# Patient Record
Sex: Female | Born: 1935 | Race: White | Hispanic: No | Marital: Married | Smoking: Former smoker
Health system: Southern US, Community
[De-identification: ages and names within clinical notes are randomized; demographics above are authoritative.]

## PROBLEM LIST (undated history)

## (undated) DIAGNOSIS — J329 Chronic sinusitis, unspecified: Secondary | ICD-10-CM

## (undated) DIAGNOSIS — E785 Hyperlipidemia, unspecified: Secondary | ICD-10-CM

## (undated) DIAGNOSIS — E039 Hypothyroidism, unspecified: Secondary | ICD-10-CM

## (undated) DIAGNOSIS — Z5189 Encounter for other specified aftercare: Secondary | ICD-10-CM

## (undated) DIAGNOSIS — J449 Chronic obstructive pulmonary disease, unspecified: Secondary | ICD-10-CM

## (undated) DIAGNOSIS — C4490 Unspecified malignant neoplasm of skin, unspecified: Secondary | ICD-10-CM

## (undated) DIAGNOSIS — G9332 Myalgic encephalomyelitis/chronic fatigue syndrome: Secondary | ICD-10-CM

## (undated) DIAGNOSIS — D369 Benign neoplasm, unspecified site: Secondary | ICD-10-CM

## (undated) DIAGNOSIS — M349 Systemic sclerosis, unspecified: Secondary | ICD-10-CM

## (undated) DIAGNOSIS — R5382 Chronic fatigue, unspecified: Secondary | ICD-10-CM

## (undated) DIAGNOSIS — K219 Gastro-esophageal reflux disease without esophagitis: Secondary | ICD-10-CM

## (undated) DIAGNOSIS — B029 Zoster without complications: Secondary | ICD-10-CM

## (undated) DIAGNOSIS — E041 Nontoxic single thyroid nodule: Secondary | ICD-10-CM

## (undated) DIAGNOSIS — G6181 Chronic inflammatory demyelinating polyneuritis: Secondary | ICD-10-CM

## (undated) DIAGNOSIS — R569 Unspecified convulsions: Secondary | ICD-10-CM

## (undated) DIAGNOSIS — N63 Unspecified lump in unspecified breast: Secondary | ICD-10-CM

## (undated) DIAGNOSIS — I2699 Other pulmonary embolism without acute cor pulmonale: Secondary | ICD-10-CM

## (undated) DIAGNOSIS — N281 Cyst of kidney, acquired: Secondary | ICD-10-CM

## (undated) DIAGNOSIS — I7300 Raynaud's syndrome without gangrene: Secondary | ICD-10-CM

## (undated) DIAGNOSIS — Z87442 Personal history of urinary calculi: Secondary | ICD-10-CM

## (undated) DIAGNOSIS — IMO0002 Reserved for concepts with insufficient information to code with codable children: Secondary | ICD-10-CM

## (undated) DIAGNOSIS — D649 Anemia, unspecified: Secondary | ICD-10-CM

## (undated) DIAGNOSIS — K635 Polyp of colon: Secondary | ICD-10-CM

## (undated) DIAGNOSIS — M1990 Unspecified osteoarthritis, unspecified site: Secondary | ICD-10-CM

## (undated) DIAGNOSIS — N319 Neuromuscular dysfunction of bladder, unspecified: Secondary | ICD-10-CM

## (undated) DIAGNOSIS — I10 Essential (primary) hypertension: Secondary | ICD-10-CM

## (undated) HISTORY — DX: Chronic obstructive pulmonary disease, unspecified: J44.9

## (undated) HISTORY — PX: OTHER SURGICAL HISTORY: SHX169

## (undated) HISTORY — DX: Nontoxic single thyroid nodule: E04.1

## (undated) HISTORY — PX: BREAST EXCISIONAL BIOPSY: SUR124

## (undated) HISTORY — DX: Anemia, unspecified: D64.9

## (undated) HISTORY — DX: Zoster without complications: B02.9

## (undated) HISTORY — PX: HEMICOLECTOMY: SHX854

## (undated) HISTORY — DX: Benign neoplasm, unspecified site: D36.9

## (undated) HISTORY — PX: NASAL POLYP SURGERY: SHX186

## (undated) HISTORY — PX: SHOULDER ARTHROSCOPY: SHX128

## (undated) HISTORY — PX: CARPAL TUNNEL RELEASE: SHX101

## (undated) HISTORY — PX: BLADDER SUSPENSION: SHX72

## (undated) HISTORY — DX: Unspecified osteoarthritis, unspecified site: M19.90

## (undated) HISTORY — DX: Polyp of colon: K63.5

## (undated) HISTORY — DX: Chronic fatigue, unspecified: R53.82

## (undated) HISTORY — DX: Hyperlipidemia, unspecified: E78.5

## (undated) HISTORY — DX: Hypothyroidism, unspecified: E03.9

## (undated) HISTORY — PX: ABDOMINAL HYSTERECTOMY: SHX81

## (undated) HISTORY — PX: DILATION AND CURETTAGE OF UTERUS: SHX78

## (undated) HISTORY — DX: Chronic sinusitis, unspecified: J32.9

## (undated) HISTORY — DX: Encounter for other specified aftercare: Z51.89

## (undated) HISTORY — DX: Unspecified lump in unspecified breast: N63.0

## (undated) HISTORY — DX: Myalgic encephalomyelitis/chronic fatigue syndrome: G93.32

## (undated) HISTORY — PX: JOINT REPLACEMENT: SHX530

## (undated) HISTORY — PX: BASAL CELL CARCINOMA EXCISION: SHX1214

## (undated) HISTORY — PX: NASAL SEPTUM SURGERY: SHX37

## (undated) HISTORY — DX: Cyst of kidney, acquired: N28.1

## (undated) HISTORY — PX: CATARACT EXTRACTION: SUR2

## (undated) HISTORY — DX: Unspecified convulsions: R56.9

## (undated) HISTORY — PX: APPENDECTOMY: SHX54

## (undated) HISTORY — DX: Unspecified malignant neoplasm of skin, unspecified: C44.90

## (undated) HISTORY — DX: Gastro-esophageal reflux disease without esophagitis: K21.9

## (undated) HISTORY — PX: FINGER GANGLION CYST EXCISION: SHX1636

## (undated) HISTORY — DX: Essential (primary) hypertension: I10

## (undated) HISTORY — DX: Other pulmonary embolism without acute cor pulmonale: I26.99

## (undated) HISTORY — DX: Raynaud's syndrome without gangrene: I73.00

## (undated) HISTORY — PX: CYSTOCELE REPAIR: SHX163

## (undated) HISTORY — DX: Reserved for concepts with insufficient information to code with codable children: IMO0002

## (undated) HISTORY — PX: PANNICULECTOMY: SUR1001

## (undated) HISTORY — PX: TUBAL LIGATION: SHX77

## (undated) HISTORY — PX: TONSILLECTOMY AND ADENOIDECTOMY: SUR1326

## (undated) HISTORY — DX: Systemic sclerosis, unspecified: M34.9

## (undated) SURGERY — Surgical Case
Anesthesia: *Unknown

---
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
## 1974-05-29 HISTORY — PX: ARTHROSCOPIC REPAIR ACL: SUR80

## 2003-06-26 ENCOUNTER — Ambulatory Visit: Admission: RE | Admit: 2003-06-26 | Discharge: 2003-06-26 | Payer: Self-pay | Admitting: Internal Medicine

## 2004-03-08 ENCOUNTER — Encounter (INDEPENDENT_AMBULATORY_CARE_PROVIDER_SITE_OTHER): Payer: Self-pay | Admitting: *Deleted

## 2004-03-08 ENCOUNTER — Ambulatory Visit (HOSPITAL_BASED_OUTPATIENT_CLINIC_OR_DEPARTMENT_OTHER): Admission: RE | Admit: 2004-03-08 | Discharge: 2004-03-08 | Payer: Self-pay | Admitting: Orthopedic Surgery

## 2004-03-08 ENCOUNTER — Ambulatory Visit (HOSPITAL_COMMUNITY): Admission: RE | Admit: 2004-03-08 | Discharge: 2004-03-08 | Payer: Self-pay | Admitting: Orthopedic Surgery

## 2009-02-11 ENCOUNTER — Encounter: Admission: RE | Admit: 2009-02-11 | Discharge: 2009-02-11 | Payer: Self-pay | Admitting: Orthopedic Surgery

## 2009-02-16 ENCOUNTER — Ambulatory Visit (HOSPITAL_BASED_OUTPATIENT_CLINIC_OR_DEPARTMENT_OTHER): Admission: RE | Admit: 2009-02-16 | Discharge: 2009-02-16 | Payer: Self-pay | Admitting: Orthopedic Surgery

## 2009-05-31 ENCOUNTER — Encounter: Admission: RE | Admit: 2009-05-31 | Discharge: 2009-05-31 | Payer: Self-pay | Admitting: Rheumatology

## 2009-08-17 ENCOUNTER — Ambulatory Visit (HOSPITAL_BASED_OUTPATIENT_CLINIC_OR_DEPARTMENT_OTHER): Admission: RE | Admit: 2009-08-17 | Discharge: 2009-08-17 | Payer: Self-pay | Admitting: Orthopedic Surgery

## 2010-04-11 LAB — PULMONARY FUNCTION TEST

## 2010-08-21 LAB — POCT HEMOGLOBIN-HEMACUE: Hemoglobin: 12.3 g/dL (ref 12.0–15.0)

## 2010-08-21 LAB — BASIC METABOLIC PANEL
BUN: 26 mg/dL — ABNORMAL HIGH (ref 6–23)
CO2: 27 mEq/L (ref 19–32)
Calcium: 9.1 mg/dL (ref 8.4–10.5)
Chloride: 108 mEq/L (ref 96–112)
Creatinine, Ser: 0.7 mg/dL (ref 0.4–1.2)
GFR calc Af Amer: >60 mL/min (ref >60)
GFR calc non Af Amer: >60 mL/min (ref >60)
Glucose, Bld: 85 mg/dL (ref 70–99)
Potassium: 4.3 mEq/L (ref 3.5–5.1)
Sodium: 141 mEq/L (ref 135–145)

## 2010-09-02 LAB — BASIC METABOLIC PANEL
BUN: 27 mg/dL — ABNORMAL HIGH (ref 6–23)
CO2: 28 mEq/L (ref 19–32)
Calcium: 9.3 mg/dL (ref 8.4–10.5)
Chloride: 102 mEq/L (ref 96–112)
Creatinine, Ser: 0.71 mg/dL (ref 0.4–1.2)
GFR calc Af Amer: >60 mL/min (ref >60)
GFR calc non Af Amer: >60 mL/min (ref >60)
Glucose, Bld: 93 mg/dL (ref 70–99)
Potassium: 4.2 mEq/L (ref 3.5–5.1)
Sodium: 138 mEq/L (ref 135–145)

## 2010-09-02 LAB — POCT HEMOGLOBIN-HEMACUE: Hemoglobin: 14.3 g/dL (ref 12.0–15.0)

## 2010-10-14 NOTE — Op Note (Signed)
NAME:  KARYSSA, AMARAL              ACCOUNT NO.:  [id number]   MEDICAL RECORD NO.:  [id number]          PATIENT TYPE:  AMB   LOCATION:  DSC                          FACILITY:  [facility]   PHYSICIAN:  Katy Fitch. 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   POSTOPERATIVE DIAGNOSES:  Inflammatory destructive arthritis of the left  long finger, proximal interphalangeal joint.   OPERATION:  Arthrodesis of left long finger proximal interphalangeal joint,  utilizing an ASIF mini-fragment plate and lag screw technique, with  supplemental Kirschner wire fixation to control rotation.   SURGEON:  Katy Fitch. Sypher, M.D.   ASSISTANT:  Marveen Reeks. Dasnoit, P.A.-C.   ANESTHESIA:  Infraclavicular block, supplemented by IV sedation.   ANESTHESIOLOGIST:  Zenon Mayo, M.D.   INDICATIONS FOR PROCEDURE:  The patient is a 77 year old woman referred by  Dr. Casimiro Needle B. Wert for the evaluation and management of a severely-painful  and deformed left long finger PIP joint.  She has a history of inflammatory  arthritis with multiple inflamed and deformed joints.  She has had multiple  prior reconstructive hand surgery procedures in various places throughout  the [place], including [facility] reconstruction and other arthritis  procedures.  After an informed consent, she is brought to the operating room  at this time with the primary indication for surgery being relief of pain,  and secondary indication the creation of a stable long finger PIP joint that  would be useful for firm prehension.  Prior to surgery, questions are  invited and answered.   DESCRIPTION OF PROCEDURE:  The patient is brought to the operating room and  placed in the supine position on the operating room table.   Following  infraclavicular block in the holding area, anesthesia was satisfactory in  the left arm.  The arm was then prepped with Betadine soap and solution and  sterilely draped.  The pneumatic tourniquet was applied to the proximal  brachium.  One g of Ancef was administered as an IV prophylactic antibiotic.  Following exsanguination of the left arm with the Esmarch bandage and  placing the arterial tourniquet to 260 mmHg due to mild systolic  hypertension.  The procedure commenced with a curvilinear incision exposing  the extensor mechanism over the PIP joint.  The extensor was severely  swollen, due to chronic arthritis.  The extensor was split in the midline  and elevated by release of the central slip.  The collateral ligaments were  released radial and ulnar, followed by opening the joint shotgun style.  The condyles of the middle and proximal phalanx were shaped in a cup and  cone manner, utilizing a power bur, and rongeur dissection.  Provisional  fixation was obtained with a 0.035 Kirschner wire, which positioned the  finger joint in 35 degrees of flexion and proper rotation to allow a  slightly supinated posture, to facilitate pulp to pulp pinch with the  thumb.  An ASIF mini-fragment plate was shaped to accommodate this joint position,  followed by placement with a total of five screws with the standard  technique and a lag screw compressing the joint.  AP and lateral C-arm  images documented satisfactory placement of the plate, Kirschner wire and  excellent joint opposition.  The wound was then thoroughly lavaged with  sterile saline, followed by a repair of the extensor with mattress sutures  of #4-0 Mersilene and repair of the skin with intradermal #4-0 Prolene.  There were no apparent complications with respect to the procedure.  A  needle stick-type injury occurred with Jonni Sanger, P.A.-C., my  assistant.  Appropriate serologies will be obtained.  The patient was  awakened from her sedation and transferred to the recovery  room in stable condition with stable vital signs.   DISPOSITION:  She will be discharged to the care of her family with a  prescription for Percocet 5 mg, one or two tab p.o. q.4-6h. p.r.n. pain, #30  tablets without refill.  Also Augmentin 875 mg, one p.o. b.i.d. x5 days.  She has the Augmentin at home at this time.       RVS/MEDQ  D:  03/08/2004  T:  03/08/2004  Job:  [id number]   cc:   Charlaine Dalton. Sherene Sires, M.D. [facility]

## 2010-11-07 ENCOUNTER — Encounter (HOSPITAL_BASED_OUTPATIENT_CLINIC_OR_DEPARTMENT_OTHER)
Admission: RE | Admit: 2010-11-07 | Discharge: 2010-11-07 | Disposition: A | Discharge status: Home / Self Care | Payer: Medicare Other | Source: Ambulatory Visit | Attending: Orthopedic Surgery | Admitting: Orthopedic Surgery

## 2010-11-07 ENCOUNTER — Ambulatory Visit
Admission: RE | Admit: 2010-11-07 | Discharge: 2010-11-07 | Disposition: A | Discharge status: Home / Self Care | Payer: Medicare Other | Source: Ambulatory Visit | Attending: Orthopedic Surgery | Admitting: Orthopedic Surgery

## 2010-11-07 ENCOUNTER — Other Ambulatory Visit: Payer: Self-pay | Admitting: Orthopedic Surgery

## 2010-11-07 DIAGNOSIS — Z01811 Encounter for preprocedural respiratory examination: Secondary | ICD-10-CM

## 2010-11-07 LAB — BASIC METABOLIC PANEL
BUN: 20 mg/dL (ref 6–23)
CO2: 27 mEq/L (ref 19–32)
Calcium: 9.1 mg/dL (ref 8.4–10.5)
Chloride: 106 mEq/L (ref 96–112)
Creatinine, Ser: 0.65 mg/dL (ref 0.4–1.2)
GFR calc Af Amer: >60 mL/min (ref >60)
GFR calc non Af Amer: >60 mL/min (ref >60)
Glucose, Bld: 78 mg/dL (ref 70–99)
Potassium: 4.2 mEq/L (ref 3.5–5.1)
Sodium: 140 mEq/L (ref 135–145)

## 2010-11-08 ENCOUNTER — Other Ambulatory Visit: Payer: Self-pay | Admitting: Orthopedic Surgery

## 2010-11-08 ENCOUNTER — Ambulatory Visit (HOSPITAL_BASED_OUTPATIENT_CLINIC_OR_DEPARTMENT_OTHER)
Admission: RE | Admit: 2010-11-08 | Discharge: 2010-11-08 | Disposition: A | Discharge status: Home / Self Care | Payer: Medicare Other | Source: Ambulatory Visit | Attending: Orthopedic Surgery | Admitting: Orthopedic Surgery

## 2010-11-08 DIAGNOSIS — J4489 Other specified chronic obstructive pulmonary disease: Secondary | ICD-10-CM | POA: Insufficient documentation

## 2010-11-08 DIAGNOSIS — Z01812 Encounter for preprocedural laboratory examination: Secondary | ICD-10-CM | POA: Insufficient documentation

## 2010-11-08 DIAGNOSIS — M1990 Unspecified osteoarthritis, unspecified site: Secondary | ICD-10-CM | POA: Insufficient documentation

## 2010-11-08 DIAGNOSIS — M720 Palmar fascial fibromatosis [Dupuytren]: Secondary | ICD-10-CM | POA: Insufficient documentation

## 2010-11-08 DIAGNOSIS — I10 Essential (primary) hypertension: Secondary | ICD-10-CM | POA: Insufficient documentation

## 2010-11-08 DIAGNOSIS — J449 Chronic obstructive pulmonary disease, unspecified: Secondary | ICD-10-CM | POA: Insufficient documentation

## 2010-11-08 DIAGNOSIS — Z01810 Encounter for preprocedural cardiovascular examination: Secondary | ICD-10-CM | POA: Insufficient documentation

## 2010-11-08 DIAGNOSIS — G8929 Other chronic pain: Secondary | ICD-10-CM | POA: Insufficient documentation

## 2010-11-08 DIAGNOSIS — Z01818 Encounter for other preprocedural examination: Secondary | ICD-10-CM | POA: Insufficient documentation

## 2010-11-08 LAB — POCT HEMOGLOBIN-HEMACUE: Hemoglobin: 14.7 g/dL (ref 12.0–15.0)

## 2010-11-10 NOTE — Op Note (Signed)
[name], [name] NO.:  [id number]  MEDICAL RECORD NO.:  [id number]  LOCATION:                                 FACILITY:  PHYSICIAN:  Katy Fitch. Moneisha Vosler, M.D.      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  OPERATION:  Resection of aggressive palmar fibromatosis left palm and ring finger.  SURGEON:  Katy Fitch. Arionna Hoggard, MD  ASSISTANT:  Annye Rusk, PA-C  ANESTHESIA:  Brachial plexus block.  SUPERVISING ANESTHESIOLOGIST:  Dr. Krista Blue.  INDICATIONS:  [name] is a 77 year old woman with severe osteoarthritis, chronic obstructive airways disease, and hypertension. We have worked together for many years due to her multiple arthritic disorders.  She has had multiple arthrodeses and proximal row carpectomy of the wrist for pain management.  [name] has a longstanding obstructive airways disease and is followed by a pulmonologist at [facility] in [place].  She is on high dose steroids, i.e. 65 mg of prednisone daily to manage her airways disease.  [name] has had a history of palmar fibromatosis.  Many years ago in [place] she had a resection of fibromatosis from the right palm.  She did not have recurrence.  She now has two large nodules, one in the pretendinous fibers of the palmar fascia in the palm extending to the ring finger and a large nodule at the proximal finger flexion crease of the left ring finger.  These are painful and growing rapidly.  She requested excisional biopsy for diagnosis, histopathologic evaluation and hopeful resolution of her pain.  After informed consent, she is brought to the operating room at this time.  Preoperatively, [name] was reminded that this is a  genetically driven disease.  It will continue to form as she ages.  We cannot guarantee that she will not have a recurrence in the operated area nor in other fingers.  Questions were invited and answered in detail.  PROCEDURE:  [name] was evaluated by Dr. Krista Blue of Anesthesia in the holding area and after informed consent had a brachial plexus block placed with ultrasound control.  After 30 minutes, she had a partial block with residual motor function in the left hand.  She is brought to room 2 at the [facility] where under Dr. Robina Ade direct supervision sedation was provided.  Due to sensory function being preserved in the long, ring and small fingers, we subsequently placed a 5 mL 2% lidocaine palmar block at the distal margin of the transverse carpal ligament in the region of the common digital nerve to the ring and small fingers.  After few moments, excellent anesthesia was achieved.  The left arm was prepped with Betadine soap solution, sterilely draped. Following exsanguination of the left arm with Esmarch bandage, the arterial tourniquet to proximal left brachium was inflated to 290 mmHg due to significant systolic hypertension.  Our plan was to lower the tourniquet as her pressure improved with application of labetalol another  medication during surgery.  After routine surgical time-out and administration of 1 gram of Ancef as an IV prophylactic antibiotic, procedure commenced with planning of an extended Brunner zigzag incision from the mid palm to the proximal phalangeal segment of the ring finger.  Skin flaps were meticulously elevated.  The more proximal palmar nodule was very aggressive, hypervascular, adherent to the dermis involving the palmar fascia and following vascular structures.  This was biopsied with circumferential dissection with a 5-mm margin and labeled as a separate specimen.  This was aggressive to the point of raising the question  of a fibrosarcoma.  The more distal nodules were typical with involvement of many facets of the palmar fascia.  This palmar nodule had the features of a spiral band on the ulnar aspect of the proximal phalanx and extended to the natatory ligament of the small finger in the lateral fascial sheath of the ring finger.  This was circumferentially dissected and removed.  A rongeur was used to clean the deep surface of the dermis at the site of the proximal nodule.  The 2 specimens were placed in formalin separately so that they could be individually examined.  Wounds were then irrigated thoroughly.  Bleeding points were electrocauterized with bipolar current followed by repair of the skin with intradermal 3-0 Prolene.  A compressive dressing was applied with Silvadene directly on the wound, Adaptic, sterile gauze, Kerlix, sterile Webril and a volar plaster splint maintaining the MP joints in extension.     Katy Fitch Kodey Xue, M.D.     RVS/MEDQ  D:  11/08/2010  T:  11/09/2010  Job:  [id number]  Electronically Signed by Josephine Igo M.D. on 11/10/2010 09:26:16 AM

## 2010-12-27 ENCOUNTER — Institutional Professional Consult (permissible substitution): Payer: Medicare Other | Admitting: Critical Care Medicine

## 2011-01-12 ENCOUNTER — Telehealth: Payer: Self-pay | Admitting: Critical Care Medicine

## 2011-01-12 NOTE — Telephone Encounter (Signed)
Called and spoke with pt.  Informed her we have only received 1 office note from [facility].  Pt states she has had PFTs done as well as CXR and CT of Chest.  She states she is going to stop by [facility] and pick up copy of cxr and ct chest images to bring with to the visit and also request copy of most recent PFT.  Nothing further needed.

## 2011-01-13 ENCOUNTER — Encounter: Payer: Self-pay | Admitting: Critical Care Medicine

## 2011-01-16 ENCOUNTER — Ambulatory Visit (INDEPENDENT_AMBULATORY_CARE_PROVIDER_SITE_OTHER)
Admission: RE | Admit: 2011-01-16 | Discharge: 2011-01-16 | Disposition: A | Discharge status: Home / Self Care | Payer: Medicare Other | Source: Ambulatory Visit | Attending: Critical Care Medicine | Admitting: Critical Care Medicine

## 2011-01-16 ENCOUNTER — Encounter: Payer: Self-pay | Admitting: Critical Care Medicine

## 2011-01-16 ENCOUNTER — Ambulatory Visit (INDEPENDENT_AMBULATORY_CARE_PROVIDER_SITE_OTHER): Payer: Medicare Other | Admitting: Critical Care Medicine

## 2011-01-16 DIAGNOSIS — F32A Depression, unspecified: Secondary | ICD-10-CM

## 2011-01-16 DIAGNOSIS — J449 Chronic obstructive pulmonary disease, unspecified: Secondary | ICD-10-CM

## 2011-01-16 DIAGNOSIS — D649 Anemia, unspecified: Secondary | ICD-10-CM | POA: Insufficient documentation

## 2011-01-16 DIAGNOSIS — I2699 Other pulmonary embolism without acute cor pulmonale: Secondary | ICD-10-CM | POA: Insufficient documentation

## 2011-01-16 DIAGNOSIS — E785 Hyperlipidemia, unspecified: Secondary | ICD-10-CM | POA: Insufficient documentation

## 2011-01-16 DIAGNOSIS — G6181 Chronic inflammatory demyelinating polyneuritis: Secondary | ICD-10-CM | POA: Insufficient documentation

## 2011-01-16 DIAGNOSIS — F329 Major depressive disorder, single episode, unspecified: Secondary | ICD-10-CM

## 2011-01-16 DIAGNOSIS — C4490 Unspecified malignant neoplasm of skin, unspecified: Secondary | ICD-10-CM | POA: Insufficient documentation

## 2011-01-16 DIAGNOSIS — I10 Essential (primary) hypertension: Secondary | ICD-10-CM | POA: Insufficient documentation

## 2011-01-16 DIAGNOSIS — J45909 Unspecified asthma, uncomplicated: Secondary | ICD-10-CM

## 2011-01-16 DIAGNOSIS — R569 Unspecified convulsions: Secondary | ICD-10-CM | POA: Insufficient documentation

## 2011-01-16 DIAGNOSIS — K219 Gastro-esophageal reflux disease without esophagitis: Secondary | ICD-10-CM

## 2011-01-16 DIAGNOSIS — G629 Polyneuropathy, unspecified: Secondary | ICD-10-CM

## 2011-01-16 DIAGNOSIS — J309 Allergic rhinitis, unspecified: Secondary | ICD-10-CM | POA: Insufficient documentation

## 2011-01-16 MED ORDER — MOMETASONE FURO-FORMOTEROL FUM 100-5 MCG/ACT IN AERO: 2.0000 | INHALATION_SPRAY | Freq: Two times a day (BID) | RESPIRATORY_TRACT | Status: DC

## 2011-01-16 NOTE — Progress Notes (Signed)
Subjective:    Patient ID: [name], female    DOB: 05-Oct-1932, 77 y.o.   MRN: [id number]  HPI Comments: In 2005 had >50 exac as well as until 2008 Better since in pulm rehab Dx 1980. Now limited with neck issue and now has a neuropathy.   Shortness of Breath This is a chronic problem. Associated symptoms include chest pain, leg swelling, neck pain, sputum production and wheezing. Pertinent negatives include no abdominal pain, ear pain, fever, headaches, hemoptysis, orthopnea, PND, rash, rhinorrhea, sore throat or vomiting. The symptoms are aggravated by lying flat and weather changes (worse with humidity). Associated symptoms comments: Chest tightness if exacerbates,  This past year has been good.  Mucus now is clear, is green if exacerbates. She has tried beta agonist inhalers, oral steroids and steroid inhalers (73mo of xolair of no benefit) for the symptoms. The treatment provided moderate relief. Her past medical history is significant for asthma, COPD and PE. There is no history of allergies, CAD, chronic lung disease, a heart failure or pneumonia. (Had PE in 1980 and DVT after knee surgery, lifelong hx of asthma, skin tests neg x 2 per [facility])    77 y.o. WF Prior dx copd needs Cspine neck fusion.  Dx 1980 with copd.  Has never been less than 50mg  /d prednisone  Past Medical History  Diagnosis Date  . Anemia   . Anxiety   . Depression   . Asthma   . Chronic fatigue syndrome   . COPD (chronic obstructive pulmonary disease)   . GERD (gastroesophageal reflux disease)   . Hyperlipidemia   . Hypertension   . Colon polyp   . Allergic rhinitis   . Vitamin D deficiency   . Pulmonary embolism   . Skin cancer     squamous cell  . Seizures   . Pneumonia   . Neuropathy      Family History  Problem Relation Age of Onset  . Heart disease Mother   . Allergies Sister   . Ovarian cancer Sister   . Thyroid cancer Sister   . Lung cancer Sister   . Colon cancer Father   .  Stomach cancer      first cousin     History   Social History  . Marital Status: Married    Spouse Name: N/A    Number of Children: 3  . Years of Education: N/A   Occupational History  .     Social History Main Topics  . Smoking status: Former Smoker -- 0.5 packs/day for 3 years    Types: Cigarettes    Quit date: 05/29/1978  . Smokeless tobacco: Never Used  . Alcohol Use: No  . Drug Use: No  . Sexually Active: Not on file   Other Topics Concern  . Not on file   Social History Narrative  . No narrative on file     Allergies  Allergen Reactions  . Iodine     Cardiac arrest  . Morphine And Related     Cardiac arrest     Outpatient Prescriptions Prior to Visit  Medication Sig Dispense Refill  . albuterol (PROAIR HFA) 108 (90 BASE) MCG/ACT inhaler Inhale 2 puffs into the lungs every 6 (six) hours as needed.        Marland Kitchen albuterol (PROVENTIL) (2.5 MG/3ML) 0.083% nebulizer solution Take 2.5 mg by nebulization every 6 (six) hours as needed.        Marland Kitchen amLODipine (NORVASC) 10 MG tablet Take 10  mg by mouth daily.        Marland Kitchen amphetamine-dextroamphetamine (ADDERALL) 10 MG tablet Take 10 mg by mouth 4 (four) times daily.        Marland Kitchen CALCIUM-VITAMIN D PO Take by mouth daily.        . diclofenac sodium (VOLTAREN) 1 % GEL Apply topically as needed.       . ergocalciferol (VITAMIN D2) 50000 UNITS capsule Take 50,000 Units by mouth once a week.        . estradiol (ESTRACE) 0.1 MG/GM vaginal cream Place 2 g vaginally daily.        . fish oil-omega-3 fatty acids 1000 MG capsule Take 4 capsules by mouth daily.        Marland Kitchen lidocaine (LIDODERM) 5 % Place 2 patches onto the skin daily. Remove & Discard patch within 12 hours or as directed by MD      . mometasone (NASONEX) 50 MCG/ACT nasal spray Place 2 sprays into the nose daily.        . montelukast (SINGULAIR) 10 MG tablet Take 10 mg by mouth at bedtime.        Marland Kitchen oxyCODONE-acetaminophen (PERCOCET) 10-325 MG per tablet Take 1 tablet by mouth every 4  (four) hours as needed.        Marland Kitchen PHENobarbital (LUMINAL) 32.4 MG tablet Take 32.4 mg by mouth. 2 in the morning, 1 at noon, 1 at bedtime      . predniSONE (DELTASONE) 20 MG tablet Take 65 mg by mouth daily.       . simvastatin (ZOCOR) 40 MG tablet Take 40 mg by mouth at bedtime.        . mometasone-formoterol (DULERA) 100-5 MCG/ACT AERO Inhale 2 puffs into the lungs as needed.       . hydrochlorothiazide (,MICROZIDE/HYDRODIURIL,) 12.5 MG capsule Take 12.5 mg by mouth daily.        . theophylline (THEO-24) 100 MG 24 hr capsule Take 100 mg by mouth 2 (two) times daily.           Review of Systems  Constitutional: Negative for fever, chills, diaphoresis, activity change, appetite change, fatigue and unexpected weight change.  HENT: Positive for hearing loss, congestion, neck pain and neck stiffness. Negative for ear pain, nosebleeds, sore throat, facial swelling, rhinorrhea, sneezing, mouth sores, trouble swallowing, dental problem, voice change, postnasal drip, sinus pressure, tinnitus and ear discharge.   Eyes: Negative for photophobia, discharge, itching and visual disturbance.  Respiratory: Positive for cough, sputum production, chest tightness, shortness of breath and wheezing. Negative for apnea, hemoptysis, choking and stridor.   Cardiovascular: Positive for chest pain and leg swelling. Negative for palpitations, orthopnea and PND.  Gastrointestinal: Positive for constipation and blood in stool. Negative for nausea, vomiting, abdominal pain and abdominal distention.       Hx of rectocoele   Genitourinary: Positive for urgency, frequency and decreased urine volume. Negative for dysuria, hematuria, flank pain and difficulty urinating.  Musculoskeletal: Positive for back pain, joint swelling, arthralgias and gait problem. Negative for myalgias.  Skin: Negative for color change, pallor and rash.  Neurological: Positive for weakness and numbness. Negative for dizziness, tremors, seizures,  syncope, speech difficulty, light-headedness and headaches.  Hematological: Negative for adenopathy. Bruises/bleeds easily.  Psychiatric/Behavioral: Negative for confusion, sleep disturbance and agitation. The patient is not nervous/anxious.        Objective:   Physical Exam Filed Vitals:   01/16/11 1549  BP: 140/80  Pulse: 93  Temp: 98.4 F (36.9 C)  TempSrc: Oral  Height: 5\' 3"  (1.6 m)  Weight: 123 lb 12.8 oz (56.155 kg)  SpO2: 97%    Gen: Pleasant, well-nourished, in no distress,  normal affect  ENT: No lesions,  mouth clear,  oropharynx clear, no postnasal drip  Neck: No JVD, no TMG, no carotid bruits  Lungs: No use of accessory muscles, no dullness to percussion, distant BS  Cardiovascular: RRR, heart sounds normal, no murmur or gallops, no peripheral edema  Abdomen: soft and NT, no HSM,  BS normal  Musculoskeletal: No deformities, no cyanosis or clubbing  Neuro: alert, non focal  Skin: Warm, no lesions or rashes   8/20 Cleda Daub: FeV1 53%  Fef 25 75 20%  8/20: CXR: NAD, copd changes    Assessment & Plan:   COPD (chronic obstructive pulmonary disease) Golds IV Copd not oxygen dependent but steroid dependent Significant lifelong asthmatic atopic features Large dose of maintenance prednisone required Needs Cspine surgery C3-4-5 Plan Use Dulera two puff twice daily Use nebulizer as needed Stop budesonide in nebulizer No change in prednisone You may be cleared for spinal surgery Return 4  months for preop recheck Pt plans surgery in Jan 2013 d/t insurance reasons Note CXR today NAD    Updated Medication List Outpatient Encounter Prescriptions as of 01/16/2011  Medication Sig Dispense Refill  . albuterol (PROAIR HFA) 108 (90 BASE) MCG/ACT inhaler Inhale 2 puffs into the lungs every 6 (six) hours as needed.        Marland Kitchen albuterol (PROVENTIL) (2.5 MG/3ML) 0.083% nebulizer solution Take 2.5 mg by nebulization every 6 (six) hours as needed.        Marland Kitchen amLODipine  (NORVASC) 10 MG tablet Take 10 mg by mouth daily.        Marland Kitchen amphetamine-dextroamphetamine (ADDERALL) 10 MG tablet Take 10 mg by mouth 4 (four) times daily.        . calcium carbonate (TUMS - DOSED IN MG ELEMENTAL CALCIUM) 500 MG chewable tablet Chew 1 tablet by mouth daily as needed.        Marland Kitchen CALCIUM-VITAMIN D PO Take by mouth daily.        . cetirizine (ZYRTEC) 10 MG tablet Take 10 mg by mouth daily.        . Chlorpheniramine Maleate (CHLOR-TRIMETON PO) Take 1 tablet by mouth daily.        . cyclobenzaprine (FLEXERIL) 10 MG tablet 1 - 2 tablets daily at bedtime       . diclofenac sodium (VOLTAREN) 1 % GEL Apply topically as needed.       . docusate sodium (COLACE) 100 MG capsule Take 100 mg by mouth daily.        . ergocalciferol (VITAMIN D2) 50000 UNITS capsule Take 50,000 Units by mouth once a week.        . estradiol (ESTRACE) 0.1 MG/GM vaginal cream Place 2 g vaginally daily.        Marland Kitchen estradiol (ESTRACE) 2 MG tablet Take 2 mg by mouth daily.        . fish oil-omega-3 fatty acids 1000 MG capsule Take 4 capsules by mouth daily.        Marland Kitchen HYDROcodone-homatropine (HYDROMET) 5-1.5 MG/5ML syrup Take by mouth as needed.        Marland Kitchen ipratropium-albuterol (DUONEB) 0.5-2.5 (3) MG/3ML SOLN Take 3 mLs by nebulization as needed.        . lidocaine (LIDODERM) 5 % Place 2 patches onto the skin daily. Remove & Discard patch within 12 hours or  as directed by MD      . mometasone (NASONEX) 50 MCG/ACT nasal spray Place 2 sprays into the nose daily.        . mometasone-formoterol (DULERA) 100-5 MCG/ACT AERO Inhale 2 puffs into the lungs 2 (two) times daily.      . montelukast (SINGULAIR) 10 MG tablet Take 10 mg by mouth at bedtime.        Marland Kitchen oxyCODONE-acetaminophen (PERCOCET) 10-325 MG per tablet Take 1 tablet by mouth every 4 (four) hours as needed.        Marland Kitchen PHENobarbital (LUMINAL) 32.4 MG tablet Take 32.4 mg by mouth. 2 in the morning, 1 at noon, 1 at bedtime      . predniSONE (DELTASONE) 20 MG tablet Take 65 mg  by mouth daily.       . psyllium (METAMUCIL) 58.6 % powder Take 1 packet by mouth daily.        . psyllium (REGULOID) 0.52 G capsule Take 5 capsules by mouth daily.        . simvastatin (ZOCOR) 40 MG tablet Take 40 mg by mouth at bedtime.        . theophylline (THEO-24) 200 MG 24 hr capsule Take 400 mg by mouth daily.        . VESICARE 10 MG tablet Take 1 tablet by mouth Daily.      Marland Kitchen DISCONTD: budesonide (PULMICORT) 0.5 MG/2ML nebulizer solution Take 0.5 mg by nebulization as needed.        Marland Kitchen DISCONTD: mometasone-formoterol (DULERA) 100-5 MCG/ACT AERO Inhale 2 puffs into the lungs as needed.       Marland Kitchen DISCONTD: hydrochlorothiazide (,MICROZIDE/HYDRODIURIL,) 12.5 MG capsule Take 12.5 mg by mouth daily.        Marland Kitchen DISCONTD: theophylline (THEO-24) 100 MG 24 hr capsule Take 100 mg by mouth 2 (two) times daily.

## 2011-01-16 NOTE — Patient Instructions (Signed)
Use Dulera two puff twice daily Use nebulizer as needed Stop budesonide in nebulizer No change in prednisone You may be cleared for spinal surgery Return 4  months for preop recheck Chest xray today

## 2011-01-16 NOTE — Progress Notes (Signed)
Quick Note:  Notify the patient that the Xray is stable and no active disease, just stable copd changes No change in medications are recommended. Continue current meds as prescribed at last office visit ______

## 2011-01-17 NOTE — Assessment & Plan Note (Addendum)
Golds IV Copd not oxygen dependent but steroid dependent Significant lifelong asthmatic atopic features Large dose of maintenance prednisone required Needs Cspine surgery C3-4-5 Plan Use Dulera two puff twice daily Use nebulizer as needed Stop budesonide in nebulizer No change in prednisone You may be cleared for spinal surgery Return 4  months for preop recheck Pt plans surgery in Jan 2013 d/t insurance reasons Note CXR today NAD

## 2011-01-18 ENCOUNTER — Telehealth: Payer: Self-pay | Admitting: Critical Care Medicine

## 2011-01-18 NOTE — Telephone Encounter (Signed)
Crystal calling to give cxr results as follows:  Notify the patient that the Xray is stable and no active disease, just stable copd changes No change in medications are recommended. Continue current meds as prescribed at last office visit  Pt advised. Carron Curie, CMA

## 2011-01-18 NOTE — Progress Notes (Signed)
Quick Note:  Per phone note from 01/18/11, pt returned my call and was informed of CXR results. ______

## 2011-03-30 DIAGNOSIS — G6181 Chronic inflammatory demyelinating polyneuritis: Secondary | ICD-10-CM

## 2011-03-30 HISTORY — DX: Chronic inflammatory demyelinating polyneuritis: G61.81

## 2011-04-11 ENCOUNTER — Encounter: Payer: Self-pay | Admitting: Internal Medicine

## 2011-04-11 ENCOUNTER — Ambulatory Visit (INDEPENDENT_AMBULATORY_CARE_PROVIDER_SITE_OTHER): Payer: Medicare Other | Admitting: Internal Medicine

## 2011-04-11 VITALS — BP 120/80 | HR 100 | Temp 98.0°F | Resp 18 | Ht 61.5 in | Wt 124.0 lb

## 2011-04-11 DIAGNOSIS — M542 Cervicalgia: Secondary | ICD-10-CM

## 2011-04-11 DIAGNOSIS — E559 Vitamin D deficiency, unspecified: Secondary | ICD-10-CM

## 2011-04-11 DIAGNOSIS — Z23 Encounter for immunization: Secondary | ICD-10-CM

## 2011-04-11 DIAGNOSIS — G8929 Other chronic pain: Secondary | ICD-10-CM

## 2011-04-11 DIAGNOSIS — R799 Abnormal finding of blood chemistry, unspecified: Secondary | ICD-10-CM

## 2011-04-11 DIAGNOSIS — Z800 Family history of malignant neoplasm of digestive organs: Secondary | ICD-10-CM

## 2011-04-11 DIAGNOSIS — G4710 Hypersomnia, unspecified: Secondary | ICD-10-CM

## 2011-04-11 DIAGNOSIS — R7989 Other specified abnormal findings of blood chemistry: Secondary | ICD-10-CM

## 2011-04-11 DIAGNOSIS — N816 Rectocele: Secondary | ICD-10-CM

## 2011-04-16 DIAGNOSIS — E559 Vitamin D deficiency, unspecified: Secondary | ICD-10-CM | POA: Insufficient documentation

## 2011-04-16 DIAGNOSIS — N816 Rectocele: Secondary | ICD-10-CM | POA: Insufficient documentation

## 2011-04-16 DIAGNOSIS — G4710 Hypersomnia, unspecified: Secondary | ICD-10-CM | POA: Insufficient documentation

## 2011-04-16 DIAGNOSIS — M542 Cervicalgia: Secondary | ICD-10-CM | POA: Insufficient documentation

## 2011-04-16 DIAGNOSIS — R799 Abnormal finding of blood chemistry, unspecified: Secondary | ICD-10-CM | POA: Insufficient documentation

## 2011-04-16 DIAGNOSIS — Z800 Family history of malignant neoplasm of digestive organs: Secondary | ICD-10-CM | POA: Insufficient documentation

## 2011-04-16 DIAGNOSIS — G8929 Other chronic pain: Secondary | ICD-10-CM | POA: Insufficient documentation

## 2011-04-16 NOTE — Assessment & Plan Note (Signed)
Maintain replacement. Obtain vit d level with next draw

## 2011-04-16 NOTE — Progress Notes (Signed)
Subjective:    Patient ID: [name], female    DOB: [birth year], 77 y.o.   MRN: [id number]  HPI Pt presents to clinic to establish care and for follow up of multiple medical problems. Followed by pain management with chronic neck pain. There is consideration of possible cervical fusion in the future. S/p cystocele repair with rectocele repair being discussed. H/o osteoporosis s/p fosamax now changed to forteo after last bmd. Tolerating adderall for hypersomnia. Tolerating statin tx for hyperlipidemia. Labs utd and reviewed. There was isolated elevation of cortisol. Also takes phenobarb and theophylline requiring monitoring.   Past Medical History  Diagnosis Date  . Anemia   . Anxiety   . Depression   . Asthma   . Chronic fatigue syndrome   . COPD (chronic obstructive pulmonary disease)   . GERD (gastroesophageal reflux disease)   . Hyperlipidemia   . Hypertension   . Colon polyp   . Allergic rhinitis   . Vitamin D deficiency   . Pulmonary embolism   . Skin cancer     squamous cell  . Seizures   . Pneumonia   . Neuropathy    Past Surgical History  Procedure Date  . Tonsillectomy and adenoidectomy   . Nasal septum surgery   . Dilation and curettage of uterus   . Tubal ligation   . Cesarean section   . Sinus surgeries   . Left ovary and tube removed   . Vocal polyps removed   . Appendectomy   . Cystocele repair   . Carpal tunnel release     right  . Shoulder arthroscopy   . Left finger fusion   . Right median nerve decompression   . Duptyren's contracture right hand   . Finger ganglion cyst excision   . Abdominal hysterectomy   . Joint replacement     right and left basal joints of thumbs  . Bladder suspension   . Cataract extraction   . Cervical neck ablation   . Squamous lesions removed   . Basal cell carcinoma excision     reports that she quit smoking about 32 years ago. Her smoking use included Cigarettes. She has a 1.5 pack-year smoking history. She has  never used smokeless tobacco. She reports that she does not drink alcohol or use illicit drugs. family history includes Allergies in her sister; Colon cancer in her father; Heart disease in her mother; Lung cancer in her sister; Ovarian cancer in her sister; Stomach cancer in an unspecified family member; and Thyroid cancer in her sister. Allergies  Allergen Reactions  . Iodine     Cardiac arrest  . Morphine And Related     Cardiac arrest     Review of Systems  Constitutional: Positive for fatigue.  Musculoskeletal: Positive for arthralgias.  All other systems reviewed and are negative.       Objective:   Physical Exam  Nursing note and vitals reviewed. Constitutional: She appears well-developed and well-nourished. No distress.  HENT:  Head: Normocephalic and atraumatic.  Right Ear: External ear normal.  Left Ear: External ear normal.  Eyes: Conjunctivae are normal. No scleral icterus.  Neck: Neck supple. No JVD present.  Cardiovascular: Normal rate, regular rhythm and normal heart sounds.  Exam reveals no gallop and no friction rub.   No murmur heard. Pulmonary/Chest: Effort normal and breath sounds normal. No respiratory distress. She has no wheezes. She has no rales.  Neurological: She is alert.  Skin: Skin is warm and dry. She  is not diaphoretic.  Psychiatric: She has a normal mood and affect.          Assessment & Plan:

## 2011-04-16 NOTE — Assessment & Plan Note (Signed)
Stable with adderall. Assume prescribing.

## 2011-04-16 NOTE — Assessment & Plan Note (Signed)
Repeat cortisol with next lab draw

## 2011-05-04 ENCOUNTER — Other Ambulatory Visit: Payer: Self-pay | Admitting: Diagnostic Neuroimaging

## 2011-05-04 ENCOUNTER — Telehealth: Payer: Self-pay | Admitting: Internal Medicine

## 2011-05-04 DIAGNOSIS — R200 Anesthesia of skin: Secondary | ICD-10-CM

## 2011-05-04 DIAGNOSIS — R269 Unspecified abnormalities of gait and mobility: Secondary | ICD-10-CM

## 2011-05-04 DIAGNOSIS — M79609 Pain in unspecified limb: Secondary | ICD-10-CM

## 2011-05-04 NOTE — Telephone Encounter (Signed)
Pt states that she dropped off her records Monday for Dr. Rhea Belton to review. States she wants to switch from [facility] to Dr. Rhea Belton. Let pt know that Dr. Rhea Belton was the hospital physician this week and that he had not had a chance to review them yet. Pt wants to be notified when he has reviewed them and if he decides to take her as a pt.

## 2011-05-05 ENCOUNTER — Ambulatory Visit
Admission: RE | Admit: 2011-05-05 | Discharge: 2011-05-05 | Disposition: A | Discharge status: Home / Self Care | Payer: Medicare Other | Source: Ambulatory Visit | Attending: Diagnostic Neuroimaging | Admitting: Diagnostic Neuroimaging

## 2011-05-05 DIAGNOSIS — R269 Unspecified abnormalities of gait and mobility: Secondary | ICD-10-CM

## 2011-05-05 DIAGNOSIS — M79609 Pain in unspecified limb: Secondary | ICD-10-CM

## 2011-05-05 DIAGNOSIS — R200 Anesthesia of skin: Secondary | ICD-10-CM

## 2011-05-05 NOTE — Progress Notes (Signed)
Blood drawn X 2 sites, pt tolerated procedure well, serum and whole blood drawn. 1311 explained discharge instructions to pt and questions answered.

## 2011-05-05 NOTE — Patient Instructions (Signed)
Lumbar Puncture Discharge Instructions ° °1. Go home and rest quietly for the next 24 hours.  It is important to lie flat for the next 24 hours.  Get up only to go to the restroom.  You may lie in the bed or on a couch on your back, your stomach, your left side or your right side.  You may have one pillow under your head.  You may have pillows between your knees while you are on your side or under your knees while you are on your back. ° °2. DO NOT drive today.  Recline the seat as far back as it will go, while still wearing your seat belt, on the way home. ° °3. You may get up to go to the bathroom as needed.  You may sit up for 10 minutes to eat.  You may resume your normal diet and medications unless otherwise indicated. ° °4. The incidence of headache, nausea, or vomiting is about 5% (one in 20 patients).  If you develop a headache, lie flat and drink plenty of fluids until the headache goes away.  Caffeinated beverages may be helpful.  If you develop severe nausea and vomiting or a headache that does not go away with flat bed rest, call [phone]. ° °5. You may resume normal activities after your 24 hours of bed rest is over; however, do not exert yourself strongly or do any heavy lifting tomorrow. ° °6. Call your physician for a follow-up appointment.  The results of your myelogram will be sent directly to your physician by the following day. ° °7. If you have any questions or if complications develop after you arrive home, please call [phone]. ° °Discharge instructions have been explained to the patient.  The patient, or the person responsible for the patient, fully understands these instructions.  °

## 2011-05-12 NOTE — Telephone Encounter (Signed)
Records reviewed. She was supposed to have EGD/colon in [facility] in early Nov. If these studies were done, I need copies for review. Okay to see in clinic, new pt appt.

## 2011-05-12 NOTE — Telephone Encounter (Signed)
Left message for pt to call back  °

## 2011-05-16 NOTE — Telephone Encounter (Signed)
Pt states the doctor in [facility] did not do the procedures. Pt scheduled to see Dr. Rhea Belton 05/25/11@4pm . Pt aware of appt date and time.

## 2011-05-18 ENCOUNTER — Encounter: Payer: Self-pay | Admitting: Internal Medicine

## 2011-05-25 ENCOUNTER — Encounter: Payer: Self-pay | Admitting: Internal Medicine

## 2011-05-25 ENCOUNTER — Ambulatory Visit (INDEPENDENT_AMBULATORY_CARE_PROVIDER_SITE_OTHER): Payer: Medicare Other | Admitting: Internal Medicine

## 2011-05-25 DIAGNOSIS — K625 Hemorrhage of anus and rectum: Secondary | ICD-10-CM

## 2011-05-25 DIAGNOSIS — K5900 Constipation, unspecified: Secondary | ICD-10-CM

## 2011-05-25 DIAGNOSIS — K219 Gastro-esophageal reflux disease without esophagitis: Secondary | ICD-10-CM

## 2011-05-25 DIAGNOSIS — Z1211 Encounter for screening for malignant neoplasm of colon: Secondary | ICD-10-CM

## 2011-05-25 DIAGNOSIS — R159 Full incontinence of feces: Secondary | ICD-10-CM

## 2011-05-25 MED ORDER — PEG-KCL-NACL-NASULF-NA ASC-C 100 G PO SOLR: 1.0000 | Freq: Once | ORAL | Status: DC

## 2011-05-25 MED ORDER — DEXLANSOPRAZOLE 60 MG PO CPDR: 60.0000 mg | DELAYED_RELEASE_CAPSULE | Freq: Every day | ORAL | Status: DC

## 2011-05-25 NOTE — Progress Notes (Signed)
Subjective:    Patient ID: [name], female    DOB: 1933/05/11, 77 y.o.   MRN: [id number]  HPI [name] is a 77 yo female with complex PMH, including, and newly diagnosed unexplained neuropathy, chronic constipation, GERD, COPD, hypertension, hyperlipidemia, arthritis, chronic fatigue who is seen for evaluation of GERD with dysphagia and chronic constipation.  The patient has previously been seen by Dr. Rodena Piety in [facility] and Dr. Bing Plume with [facility].  She was last seen by Dr. Norma Fredrickson in November 2012 and he recommended upper endoscopy and colonoscopy, but this was never performed.  Today she reports long-standing history of chronic constipation, occasionally requiring manual disimpaction. She has been taking MiraLAX 34 g every other day and using Dulcolax suppositories approximately 3 times per week. It was recommended that she try fleets enemas, but this is very painful and she has not use these. She does report occasional lower abdominal discomfort but this is not a major symptom for her. She reports soft stools with occasional streaks of blood. She frequently has spontaneous bowel movements without control. For this she has been wearing adult diapers. She does note recently her stool has been soft and she has not felt overly constipated, though she remains not in control of when she defecates. She reports often having a bowel movement when urinating. She reports having a known rectocele diagnosed by her urologist, but this has not been repaired. She has previously had a cystocele repaired. She reports having colonoscopies in the past which revealed hyperplastic polyps, but has never had an excellent or good prep. She is in favor of having repeat colonoscopy, but worries about inadequate preps.  Regarding her heartburn, she states this is a daily ongoing problem. She has tried Nexium and Pepcid in the past without relief. She reports this as an annoyance in her epigastrium and  occasionally as a burning. TUMS "worked like a Mining engineer for this heartburn feeling, but only lasts 3-4 hours. She reports this can happen at any time of day but is always daily. She reports mild nausea for which she's been using Zofran with excellent relief. No vomiting. She also reports some solid food dysphagia without history of food impaction.  Mild early satiety. She also reports history of gastric ulcer in 2008, but no history of H. Pylori.  Review of Systems Constitutional: Negative for fever, chills, night sweats, appetite change and unexpected weight change, positive for fatigue HEENT: Negative for sore throat, mouth sores and trouble swallowing. Eyes: Negative for visual disturbance Respiratory: Negative for cough, chest tightness and shortness of breath Cardiovascular: Negative for chest pain, palpitations and lower extremity swelling Gastrointestinal: See history of present illness Genitourinary: Negative for dysuria and hematuria. Musculoskeletal: Positive for arthritis in her neck, hands, positive for back pain Skin: Negative for rash or color change Neurological: Negative for headaches, weakness, numbness Hematological: Negative for adenopathy, negative for easy bruising/bleeding Psychiatric/behavioral: Negative for depressed mood, negative for anxiety   Past Medical History  Diagnosis Date  . Anemia   . Anxiety     patient denied  . Depression     patient denied  . Asthma   . Chronic fatigue syndrome   . COPD (chronic obstructive pulmonary disease)   . GERD (gastroesophageal reflux disease)   . Hyperlipidemia   . Hypertension   . Colon polyp   . Allergic rhinitis   . Vitamin D deficiency   . Pulmonary embolism   . Skin cancer     squamous cell  .  Seizures   . Pneumonia   . Neuropathy   . Arthritis    Past Surgical History  Procedure Date  . Tonsillectomy and adenoidectomy   . Nasal septum surgery   . Dilation and curettage of uterus   . Tubal ligation   .  Cesarean section   . Sinus surgeries     x 4  . Left ovary and tube removed   . Vocal polyps removed   . Appendectomy   . Cystocele repair   . Carpal tunnel release     right  . Shoulder arthroscopy     x2 left, 1 right  . Left finger fusion   . Right median nerve decompression   . Duptyren's contracture right hand   . Finger ganglion cyst excision     right  . Abdominal hysterectomy   . Joint replacement     right and left basal joints of thumbs  . Bladder suspension   . Cataract extraction     bilateral  . Cervical neck ablation     x 7, C3-C4  . Squamous lesions removed     neck and face  . Basal cell carcinoma excision     face   Current Outpatient Prescriptions  Medication Sig Dispense Refill  . albuterol (PROAIR HFA) 108 (90 BASE) MCG/ACT inhaler Inhale 2 puffs into the lungs every 6 (six) hours as needed.        Marland Kitchen albuterol (PROVENTIL) (2.5 MG/3ML) 0.083% nebulizer solution Take 2.5 mg by nebulization every 6 (six) hours as needed.        Marland Kitchen amLODipine (NORVASC) 10 MG tablet Take 10 mg by mouth daily.        Marland Kitchen amphetamine-dextroamphetamine (ADDERALL) 10 MG tablet Take 10 mg by mouth 4 (four) times daily.        . calcium carbonate (TUMS - DOSED IN MG ELEMENTAL CALCIUM) 500 MG chewable tablet Chew 1 tablet by mouth daily as needed.        Marland Kitchen CALCIUM-VITAMIN D PO Take by mouth daily.        . cetirizine (ZYRTEC) 10 MG tablet Take 10 mg by mouth daily.        . Chlorpheniramine Maleate (CHLOR-TRIMETON PO) Take 1 tablet by mouth daily.        . diclofenac sodium (VOLTAREN) 1 % GEL Apply topically as needed.       . ergocalciferol (VITAMIN D2) 50000 UNITS capsule Take 50,000 Units by mouth once a week.        . estradiol (ESTRACE) 2 MG tablet Take 2 mg by mouth daily.        . fesoterodine (TOVIAZ) 4 MG TB24 Take 4 mg by mouth daily.        Marland Kitchen HYDROcodone-homatropine (HYDROMET) 5-1.5 MG/5ML syrup Take by mouth as needed.        . lidocaine (LIDODERM) 5 % Place 2 patches  onto the skin daily. Remove & Discard patch within 12 hours or as directed by MD      . mometasone (NASONEX) 50 MCG/ACT nasal spray Place 2 sprays into the nose daily.        . mometasone-formoterol (DULERA) 100-5 MCG/ACT AERO Inhale 2 puffs into the lungs 2 (two) times daily.      . montelukast (SINGULAIR) 10 MG tablet Take 10 mg by mouth at bedtime.        . ondansetron (ZOFRAN) 4 MG tablet Take 4 mg by mouth as needed.        Marland Kitchen  oxyCODONE-acetaminophen (PERCOCET) 10-325 MG per tablet Take 1 tablet by mouth every 4 (four) hours as needed.        Marland Kitchen PHENobarbital (LUMINAL) 32.4 MG tablet Take 32.4 mg by mouth. 2 in the morning, 1 at noon, 1 at bedtime      . predniSONE (DELTASONE) 20 MG tablet Take 65 mg by mouth daily.       . simvastatin (ZOCOR) 40 MG tablet Take 40 mg by mouth at bedtime.        . Teriparatide, Recombinant, (FORTEO [place]) Inject 20 mcg into the skin daily.        . theophylline (THEO-24) 200 MG 24 hr capsule Take 200 mg by mouth 2 (two) times daily.       Marland Kitchen dexlansoprazole (DEXILANT) 60 MG capsule Take 1 capsule (60 mg total) by mouth daily.  30 capsule  11  . peg 3350 powder (MOVIPREP) 100 G SOLR Take 1 kit (100 g total) by mouth once.  1 kit  0   Allergies  Allergen Reactions  . Iodine     Cardiac arrest  . Morphine And Related     Cardiac arrest   Family History  Problem Relation Age of Onset  . Heart disease Mother   . Allergies Sister   . Ovarian cancer Sister   . Thyroid cancer Sister   . Lung cancer Sister   . Colon cancer Father   . Stomach cancer      first cousin  . Hyperlipidemia Mother   . Hyperlipidemia Maternal Aunt     Social History  . Marital Status: Married    Number of Children: 3   Occupational History  . retired    Social History Main Topics  . Smoking status: Former Smoker -- 0.5 packs/day for 3 years    Types: Cigarettes    Quit date: 05/29/1978  . Smokeless tobacco: Never Used  . Alcohol Use: No  . Drug Use: No      Objective:    Physical Exam BP 136/94  Pulse 88  Ht 5' 1.5" (1.562 m)  Wt 125 lb (56.7 kg)  BMI 23.24 kg/m2 Constitutional: Well-developed and well-nourished. No distress. HEENT: Normocephalic and atraumatic. Oropharynx is clear and moist. No oropharyngeal exudate. Conjunctivae are normal. Pupils are equal round and reactive to light. No scleral icterus. Neck: Neck supple. Trachea midline. Cardiovascular: Normal rate, regular rhythm and intact distal pulses. Soft 2/6 systolic ejection murmur Pulmonary/chest: Effort normal and breath sounds normal. No wheezing, rales or rhonchi. Abdominal: Soft, nontender, nondistended. Well-healed scars, Bowel sounds active throughout. There are no masses palpable. No hepatosplenomegaly. Extremities: no clubbing, cyanosis, or edema, changes of arthritis bilateral hands Lymphadenopathy: No cervical adenopathy noted. Neurological: Alert and oriented to person place and time. Skin: Skin is warm and dry. No rashes noted. Psychiatric: Normal mood and affect. Behavior is normal.  Colonoscopy 04/03/2007 -- exam to the cecum, 8 mm polyp removed from the rectum. Inadequate bowel prep, limited view. Pathology: Hyperplastic polyp Colonoscopy 08/08/2007 -- 8 mm polyp removed from rectum with hot forceps. View of the colonic mucosa was limited due to inadequate bowel prep. Pathology: Hyperplastic polyp Pathology only dated 01/11/2009 -- mid ascending colon polyp = Hyperplastic, Colon 40 cm polypectomy = eroded hyperplastic polyp  EGD 05/08/2007 -- esophagitis, gastritis, gastric ulcer.  Pathology: Antrum features consistent with peptic ulceration. Helicobacter-like organisms not identified. Esophagus 1 cm above GE junction chronic inflammation without intestinal metaplasia. Esophagus 3 cm above GE junction chronic inflammation without intestinal  metaplasia  EGD 07/03/2007 -- gastric ulcer, completely healed. Gastritis. Possible Barrett's esophagus.  Pathology: Esophagus minimal  chronic inflammation of gastric type mucosa unassociated with intestinal metaplasia/     Assessment & Plan:   77 yo female with complex PMH, including, and newly diagnosed unexplained neuropathy, chronic constipation, GERD, COPD, hypertension, hyperlipidemia, arthritis, chronic fatigue who is seen for evaluation of GERD with dysphagia and chronic constipation.  1. GERD -- the patient's reflux disease is currently uncontrolled, and she is using TUMS for relief. She is not currently on PPI therapy, though it sounds like she's tried Nexium in the past. I would like to try her on Dexilant once daily. We discussed his medication and how best to take it. Given her long-standing history of reflux disease and ongoing symptoms including nausea and dysphasia, I recommended endoscopy. She is willing to proceed. This will be done with propofol sedation.  2. Chronic constipation/encopresis -- the patient has long-standing history of chronic constipation and it seems that she is having effective bowel movements using MiraLAX 34 g every other day. She is also using Dulcolax suppositories 2-3 times weekly. I've recommended that she continue this therapy so that she does not become constipated and subsequently develop the lower abdominal pain which is common for her with constipation.  I think there are likely multiple factors playing into her constipation and spontaneous/uncontrolled BMs. She is on narcotics chronically for pain which very likely contributes to her constipation and slower bowel transit. She is in the process of having a thorough evaluation for neuropathy, which may contribute to trouble sensing and controlling bowel movement.  Finally, she has a rectocele which likely contributes to her trouble with defecation. We will proceed with colonoscopy with a 2 day prep for polyp surveillance/screening and to rule out other underlying pathology. I've advised that she continue MiraLAX either 17 g every day or 34 g  every other day. She will use Dulcolax suppositories as needed.   Further recommendation after procedures.

## 2011-05-25 NOTE — Patient Instructions (Signed)
You have been scheduled for a Upper Endoscopy/ Colonoscopy with propofol. See separate instructions. Please stay on a clear liquid diet 2 days prior to procedure.  Start Dexilant samples one tablet by mouth once daily and a prescription has been sent to the pharmacy.  cc: Charlynn Court, MD

## 2011-05-26 ENCOUNTER — Encounter: Payer: Self-pay | Admitting: Internal Medicine

## 2011-05-26 ENCOUNTER — Other Ambulatory Visit: Payer: Self-pay | Admitting: *Deleted

## 2011-05-26 MED ORDER — ESOMEPRAZOLE MAGNESIUM 40 MG PO CPDR: 40.0000 mg | DELAYED_RELEASE_CAPSULE | Freq: Every day | ORAL | Status: DC

## 2011-05-26 NOTE — Telephone Encounter (Signed)
Insurance will not cover dexilant they will only cover nexium and omeprazole.

## 2011-05-30 DIAGNOSIS — N319 Neuromuscular dysfunction of bladder, unspecified: Secondary | ICD-10-CM

## 2011-05-30 HISTORY — DX: Neuromuscular dysfunction of bladder, unspecified: N31.9

## 2011-05-31 ENCOUNTER — Telehealth: Payer: Self-pay | Admitting: Internal Medicine

## 2011-06-01 ENCOUNTER — Telehealth: Payer: Self-pay | Admitting: Internal Medicine

## 2011-06-01 ENCOUNTER — Other Ambulatory Visit: Payer: Self-pay | Admitting: Diagnostic Neuroimaging

## 2011-06-02 ENCOUNTER — Other Ambulatory Visit: Payer: Self-pay | Admitting: Gastroenterology

## 2011-06-02 DIAGNOSIS — K219 Gastro-esophageal reflux disease without esophagitis: Secondary | ICD-10-CM

## 2011-06-02 MED ORDER — DEXLANSOPRAZOLE 60 MG PO CPDR: 60.0000 mg | DELAYED_RELEASE_CAPSULE | Freq: Every day | ORAL | Status: DC

## 2011-06-02 NOTE — Telephone Encounter (Signed)
Called pt. Left voicemail for her to call me back regarding her medications she said where not working for her.

## 2011-06-06 ENCOUNTER — Other Ambulatory Visit (HOSPITAL_COMMUNITY): Payer: Self-pay | Admitting: *Deleted

## 2011-06-07 ENCOUNTER — Telehealth: Payer: Self-pay | Admitting: Internal Medicine

## 2011-06-09 ENCOUNTER — Other Ambulatory Visit: Payer: Self-pay | Admitting: *Deleted

## 2011-06-09 MED ORDER — LIDOCAINE 5 % EX PTCH: 2.0000 | MEDICATED_PATCH | CUTANEOUS | Status: DC

## 2011-06-09 NOTE — Telephone Encounter (Signed)
[id number] 

## 2011-06-09 NOTE — Telephone Encounter (Signed)
Refill sent to pharmacy. Call placed to patient at [phone], she was made aware of rx refill to pharmacy.

## 2011-06-09 NOTE — Telephone Encounter (Signed)
Patient called and left voice message requesting a refill on Lidoderm Patch to [facility]. Her message stated she used the patch for her neck and  Back pain. She states she has 2 patches remaining. Please advise on refill.

## 2011-06-12 ENCOUNTER — Encounter: Payer: Self-pay | Admitting: Internal Medicine

## 2011-06-12 ENCOUNTER — Ambulatory Visit (AMBULATORY_SURGERY_CENTER): Payer: Medicare Other | Admitting: Internal Medicine

## 2011-06-12 VITALS — BP 179/91 | HR 109 | Temp 98.2°F | Resp 24 | Ht 61.5 in | Wt 125.0 lb

## 2011-06-12 DIAGNOSIS — R1310 Dysphagia, unspecified: Secondary | ICD-10-CM

## 2011-06-12 DIAGNOSIS — D130 Benign neoplasm of esophagus: Secondary | ICD-10-CM

## 2011-06-12 DIAGNOSIS — D126 Benign neoplasm of colon, unspecified: Secondary | ICD-10-CM

## 2011-06-12 DIAGNOSIS — K219 Gastro-esophageal reflux disease without esophagitis: Secondary | ICD-10-CM

## 2011-06-12 DIAGNOSIS — Z1211 Encounter for screening for malignant neoplasm of colon: Secondary | ICD-10-CM

## 2011-06-12 DIAGNOSIS — K625 Hemorrhage of anus and rectum: Secondary | ICD-10-CM

## 2011-06-12 DIAGNOSIS — D131 Benign neoplasm of stomach: Secondary | ICD-10-CM

## 2011-06-12 MED ORDER — SODIUM CHLORIDE 0.9 % IV SOLN: 500.0000 mL | INTRAVENOUS | Status: DC

## 2011-06-12 NOTE — Progress Notes (Signed)
Patient did not experience any of the following events: a burn prior to discharge; a fall within the facility; wrong site/side/patient/procedure/implant event; or a hospital transfer or hospital admission upon discharge from the facility. (G8907) Patient did not have preoperative order for IV antibiotic SSI prophylaxis. (G8918)  

## 2011-06-12 NOTE — Op Note (Signed)
[facility] [address] [place], [place]  [address]  COLONOSCOPY PROCEDURE REPORT  PATIENT:  [name], [name]  MR#:  [id number] BIRTHDATE:  1933/03/05, 78 yrs. old  GENDER:  female ENDOSCOPIST:  Carie Caddy. Druscilla Petsch, MD REF. BY:  Charlynn Court, M.D. PROCEDURE DATE:  06/12/2011 PROCEDURE:  Colonoscopy with biopsy ASA CLASS:  Class III INDICATIONS:  surveillance (history of prior colon polyps) MEDICATIONS:   MAC sedation, administered by CRNA, propofol (Diprivan) 500 mg IV  DESCRIPTION OF PROCEDURE:   After the risks benefits and alternatives of the procedure were thoroughly explained, informed consent was obtained.  Digital rectal exam was performed and revealed decreased sphincter tone and external hemorrhoids.   The LB [id number]-[id number] endoscope was introduced through the anus and advanced to the cecum, which was identified by both the appendix and ileocecal valve, without limitations.  The quality of the prep was good, using MoviPrep.  The instrument was then slowly withdrawn as the colon was fully examined. <<PROCEDUREIMAGES>>  FINDINGS:  Tortuous colon with significant looping despite manual pressure, position changes, and the use of a pediatric colonoscope. A 20-25 mm sessile polyp was found in the cecum. This polyp was flat and located behind the IC valve and extending towards the appendiceal orifice.  With standard forceps, biopsies were obtained and sent to pathology.   Retroflexed views in the rectum revealed internal hemorrhoids.  Complete retroflexed view was difficulty to obtain due to loss of sphincter tone. The scope was then withdrawn from the cecum and the procedure completed.  COMPLICATIONS:  None  ENDOSCOPIC IMPRESSION: 1) Large sessile/flat  polyp in the cecum felt not removable endoscopically.  Multiple biopsies obtained. 2) Internal hemorrhoids 3) Decreased anal sphincter tone.  RECOMMENDATIONS: 1) Await pathology results 2) My office will  arrange for you to meet with a surgeon based on pathology results. 3) You will be contacted once pathology results are available.Carie Caddy. Rhea Belton, MD  CC:  Charlynn Court MD The Patient  n. eSIGNEDCarie Caddy. Hawkins Seaman at 06/12/2011 11:24 AM  Rosine Abe, [phone]

## 2011-06-12 NOTE — Patient Instructions (Signed)
Resume medications. Information given on gastritis,gerd,stricture,polyps,hemorrhoids and high fiber diet.Dilation diet given.D/C Instructions reviewed with family.

## 2011-06-12 NOTE — Op Note (Signed)
[facility] [address] [place], [place]  [address]  ENDOSCOPY PROCEDURE REPORT  PATIENT:  Rhayne, Chatwin  MR#:  [id number] BIRTHDATE:  [birth year], 78 yrs. old  GENDER:  female ENDOSCOPIST:  Carie Caddy. Nayel Purdy, MD Referred by:  Charlynn Court, M.D. PROCEDURE DATE:  06/12/2011 PROCEDURE:  EGD with biopsy, 43239, EGD with balloon dilatation ASA CLASS:  Class III INDICATIONS:  dysphagia, GERD MEDICATIONS:    MAC sedation, administered by CRNA, propofol (Diprivan) 300 mg TOPICAL ANESTHETIC:  none  DESCRIPTION OF PROCEDURE:   After the risks benefits and alternatives of the procedure were thoroughly explained, informed consent was obtained.  The LB GIF-H180 [id number] endoscope was introduced through the mouth and advanced to the second portion of the duodenum, without limitations.  The instrument was slowly withdrawn as the mucosa was fully examined. <<PROCEDUREIMAGES>>  A partial Schatzki's ring was found at the gastroesophageal junction. Balloon dilation was performed with TTS balloon 15-16.5-86mm.  Scant heme after inflation with 18 mm balloon. Otherwise normal esophagus. Multiple biopsies were obtained and sent to pathology to rule out EoE.  A hiatal hernia was found.  A 5 mm sessile polyp was found in the body of the stomach. Two biopsies were obtained and sent to pathology and the polyp was felt completely removed.  Mild gastritis was found in the antrum.  The duodenal bulb was normal in appearance, as was the postbulbar duodenum.    Retroflexed views revealed findings as previously described.    The scope was then withdrawn from the patient and the procedure completed.  COMPLICATIONS:  None  ENDOSCOPIC IMPRESSION: 1) Partial Schatzki's ring at the gastroesophageal junction. Balloon dilation performed (largest dilator 18 mm) 2) Otherwise normal esophagus.  Biopsies performed and sent to pathology. 3) Hiatal hernia 4) Sessile polyp in the body of the stomach.   Removed and sent to pathology. 5) Mild gastritis in the antrum 6) Normal duodenum  RECOMMENDATIONS: 1) Await pathology results 2) Continue PPI 3)  If swallowing dysfunction returns, repeat dilation can be considered.  Carie Caddy. Rhea Belton, MD  CC:  Charlynn Court MD The Patient  n. eSIGNEDCarie Caddy. Cory Kitt at 06/12/2011 11:11 AM  Rosine Abe, [phone]

## 2011-06-12 NOTE — Progress Notes (Signed)
Propofol administered by s camp crna. See scanned intra procedure report. ewm  Pt tachycardic but states that this is normal for her. Denies anxiety, nervousness. ewm

## 2011-06-13 ENCOUNTER — Telehealth: Payer: Self-pay

## 2011-06-13 NOTE — Telephone Encounter (Signed)

## 2011-06-19 ENCOUNTER — Encounter (HOSPITAL_COMMUNITY)
Admission: RE | Admit: 2011-06-19 | Discharge: 2011-06-19 | Disposition: A | Discharge status: Home / Self Care | Payer: Medicare Other | Source: Ambulatory Visit | Attending: Diagnostic Neuroimaging | Admitting: Diagnostic Neuroimaging

## 2011-06-19 ENCOUNTER — Encounter (HOSPITAL_COMMUNITY): Payer: Self-pay

## 2011-06-19 DIAGNOSIS — G6181 Chronic inflammatory demyelinating polyneuritis: Secondary | ICD-10-CM | POA: Insufficient documentation

## 2011-06-19 HISTORY — DX: Chronic inflammatory demyelinating polyneuritis: G61.81

## 2011-06-19 MED ORDER — DIPHENHYDRAMINE HCL 25 MG PO CAPS
50.0000 mg | ORAL_CAPSULE | Freq: Every day | ORAL | Status: DC
  Administered 2011-06-19: 50 mg via ORAL

## 2011-06-19 MED ORDER — ACETAMINOPHEN 325 MG PO TABS
650.0000 mg | ORAL_TABLET | Freq: Every day | ORAL | Status: DC
  Administered 2011-06-19: 650 mg via ORAL

## 2011-06-19 MED ORDER — IMMUNE GLOBULIN (HUMAN) 10 GM/100ML IV SOLN
25.0000 g | INTRAVENOUS | Status: DC
  Administered 2011-06-19: 25 g via INTRAVENOUS
  Filled 2011-06-19 (×2): qty 250

## 2011-06-19 MED ORDER — DEXTROSE 5 % IV SOLN
INTRAVENOUS | Status: DC
  Administered 2011-06-19: 11:00:00 via INTRAVENOUS
  Filled 2011-06-19: qty 1000

## 2011-06-20 ENCOUNTER — Encounter (HOSPITAL_COMMUNITY)
Admission: RE | Admit: 2011-06-20 | Discharge: 2011-06-20 | Disposition: A | Discharge status: Home / Self Care | Payer: Medicare Other | Source: Ambulatory Visit | Attending: Diagnostic Neuroimaging | Admitting: Diagnostic Neuroimaging

## 2011-06-20 ENCOUNTER — Telehealth: Payer: Self-pay | Admitting: *Deleted

## 2011-06-20 DIAGNOSIS — D126 Benign neoplasm of colon, unspecified: Secondary | ICD-10-CM

## 2011-06-20 DIAGNOSIS — K635 Polyp of colon: Secondary | ICD-10-CM

## 2011-06-20 MED ORDER — IMMUNE GLOBULIN (HUMAN) 10 GM/100ML IV SOLN
25.0000 g | INTRAVENOUS | Status: AC
  Administered 2011-06-20 (×4): 25 g via INTRAVENOUS
  Filled 2011-06-20 (×2): qty 250

## 2011-06-20 MED ORDER — ACETAMINOPHEN 325 MG PO TABS
650.0000 mg | ORAL_TABLET | Freq: Every day | ORAL | Status: DC
  Administered 2011-06-20: 650 mg via ORAL

## 2011-06-20 MED ORDER — DIPHENHYDRAMINE HCL 25 MG PO CAPS
50.0000 mg | ORAL_CAPSULE | Freq: Every day | ORAL | Status: DC
  Administered 2011-06-20: 50 mg via ORAL

## 2011-06-20 MED ORDER — DEXTROSE 5 % IV SOLN
INTRAVENOUS | Status: DC
  Administered 2011-06-20: 10:00:00 via INTRAVENOUS
  Filled 2011-06-20 (×2): qty 1000

## 2011-06-20 NOTE — Telephone Encounter (Signed)
Notified pt we have scheduled her for a referral with Dr Donell Beers at [facility] for 07/17/11 at 0900am, be there at 0830am; mailed her directions with appt info. Pt stated understanding.

## 2011-06-20 NOTE — Telephone Encounter (Signed)
Informed pt per Dr Rhea Belton, the biopsies taken showed the polyp in her cecum was a tubular adenoma; there was no cancer or high grade dysplasia found. Due to the size, location and the fact she has a very tortuous colon, he was unable to remove the entire polyp and surgery is the only option. We discussed her IVIG treatments, she will have IVIG 5 days this week, rest for 3-4 weeks then repeat, and Dr Rhea Belton states it's ok to wait until those are complete. Pt stated she may see the surgeon during her " rest period" and the surgeon and she can decide when to have surgery. She has no preference for a surgeon, but wants to stay with [facility]. As far her EGD path, the gastric polyp was low grade dysplasia and negative for H. Pylori. Dr Rhea Belton thinks he got the entire polyp, but he will need to repeat the EGD in about 6 months to make sure. Pt stated understanding and sounded and stated that she has a very positive attitude. I will call with the surgical referral and will put in a COLON recall in 6 months.

## 2011-06-20 NOTE — Telephone Encounter (Signed)
Message copied by Florene Glen on Tue Jun 20, 2011  8:55 AM ------      Message from: Beverley Fiedler      Created: Tue Jun 20, 2011  8:54 AM       Call pt as per our discussion.      Hilda Lias this while take the place of path letter.      Aram Beecham will place recall for procedures.

## 2011-06-21 ENCOUNTER — Telehealth: Payer: Self-pay | Admitting: Internal Medicine

## 2011-06-21 ENCOUNTER — Encounter (HOSPITAL_COMMUNITY): Payer: Self-pay

## 2011-06-21 ENCOUNTER — Encounter (HOSPITAL_COMMUNITY)
Admission: RE | Admit: 2011-06-21 | Discharge: 2011-06-21 | Disposition: A | Discharge status: Home / Self Care | Payer: Medicare Other | Source: Ambulatory Visit | Attending: Diagnostic Neuroimaging | Admitting: Diagnostic Neuroimaging

## 2011-06-21 MED ORDER — ACETAMINOPHEN 325 MG PO TABS
650.0000 mg | ORAL_TABLET | Freq: Every day | ORAL | Status: DC
  Administered 2011-06-21: 650 mg via ORAL

## 2011-06-21 MED ORDER — DIPHENHYDRAMINE HCL 25 MG PO CAPS
50.0000 mg | ORAL_CAPSULE | Freq: Every day | ORAL | Status: DC
  Administered 2011-06-21: 50 mg via ORAL

## 2011-06-21 MED ORDER — DEXTROSE 5 % IV SOLN
INTRAVENOUS | Status: DC
  Administered 2011-06-21: 10:00:00 via INTRAVENOUS

## 2011-06-21 MED ORDER — IMMUNE GLOBULIN (HUMAN) 10 GM/100ML IV SOLN
25.0000 g | INTRAVENOUS | Status: DC
  Administered 2011-06-21: 25 g via INTRAVENOUS
  Filled 2011-06-21 (×2): qty 250

## 2011-06-21 NOTE — Telephone Encounter (Signed)
Received medical records from [facility] °

## 2011-06-21 NOTE — Telephone Encounter (Signed)
Agree with your documentation, but the recall procedure in 6 months is an EGD, not colonoscopy. Thanks.

## 2011-06-22 ENCOUNTER — Encounter (HOSPITAL_COMMUNITY)
Admission: RE | Admit: 2011-06-22 | Discharge: 2011-06-22 | Disposition: A | Discharge status: Home / Self Care | Payer: Medicare Other | Source: Ambulatory Visit | Attending: Diagnostic Neuroimaging | Admitting: Diagnostic Neuroimaging

## 2011-06-22 MED ORDER — DEXTROSE 5 % IV SOLN
INTRAVENOUS | Status: DC
  Administered 2011-06-22: 11:00:00 via INTRAVENOUS
  Filled 2011-06-22: qty 1000

## 2011-06-22 MED ORDER — ACETAMINOPHEN 325 MG PO TABS
650.0000 mg | ORAL_TABLET | Freq: Every day | ORAL | Status: DC
  Administered 2011-06-22: 650 mg via ORAL

## 2011-06-22 MED ORDER — IMMUNE GLOBULIN (HUMAN) 10 GM/100ML IV SOLN
25.0000 g | INTRAVENOUS | Status: DC
  Administered 2011-06-22: 25 g via INTRAVENOUS
  Filled 2011-06-22 (×2): qty 250

## 2011-06-22 MED ORDER — DIPHENHYDRAMINE HCL 25 MG PO CAPS
50.0000 mg | ORAL_CAPSULE | Freq: Every day | ORAL | Status: DC
  Administered 2011-06-22: 50 mg via ORAL

## 2011-06-23 ENCOUNTER — Ambulatory Visit: Payer: Medicare Other | Admitting: Internal Medicine

## 2011-06-23 ENCOUNTER — Encounter (HOSPITAL_COMMUNITY)
Admission: RE | Admit: 2011-06-23 | Discharge: 2011-06-23 | Disposition: A | Discharge status: Home / Self Care | Payer: Medicare Other | Source: Ambulatory Visit | Attending: Diagnostic Neuroimaging | Admitting: Diagnostic Neuroimaging

## 2011-06-23 MED ORDER — DEXTROSE 5 % IV SOLN
INTRAVENOUS | Status: AC
  Administered 2011-06-23: 10:00:00 via INTRAVENOUS

## 2011-06-23 MED ORDER — ACETAMINOPHEN 325 MG PO TABS
ORAL_TABLET | ORAL | Status: AC
  Filled 2011-06-23: qty 2

## 2011-06-23 MED ORDER — DIPHENHYDRAMINE HCL 25 MG PO CAPS
ORAL_CAPSULE | ORAL | Status: AC
  Filled 2011-06-23: qty 2

## 2011-06-23 MED ORDER — ACETAMINOPHEN 325 MG PO TABS
650.0000 mg | ORAL_TABLET | Freq: Every day | ORAL | Status: AC
  Administered 2011-06-23: 650 mg via ORAL

## 2011-06-23 MED ORDER — IMMUNE GLOBULIN (HUMAN) 10 GM/100ML IV SOLN
25.0000 g | INTRAVENOUS | Status: DC
  Administered 2011-06-23: 25 g via INTRAVENOUS
  Filled 2011-06-23 (×2): qty 250

## 2011-06-23 MED ORDER — DIPHENHYDRAMINE HCL 25 MG PO CAPS
50.0000 mg | ORAL_CAPSULE | Freq: Every day | ORAL | Status: AC
  Administered 2011-06-23: 50 mg via ORAL

## 2011-06-26 MED FILL — Diphenhydramine HCl Cap 25 MG: ORAL | Qty: 100 | Status: AC

## 2011-06-26 MED FILL — Diphenhydramine HCl Tab 25 MG: ORAL | Qty: 1 | Status: AC

## 2011-06-27 ENCOUNTER — Other Ambulatory Visit: Payer: Self-pay | Admitting: *Deleted

## 2011-06-27 MED ORDER — PHENOBARBITAL 32.4 MG PO TABS: ORAL_TABLET | ORAL | Status: DC

## 2011-06-27 NOTE — Telephone Encounter (Signed)
Refill was printed and called to Shanda Bumps at [facility].

## 2011-06-27 NOTE — Telephone Encounter (Signed)
Patient called and left voice message requesting a refill on Phenobarbital. She states she is out and would like a Rx to [facility]. Patient is scheduled for follow up on 06/29/2011.

## 2011-06-27 NOTE — Telephone Encounter (Signed)
Ok with 3 rf 

## 2011-06-29 ENCOUNTER — Ambulatory Visit (INDEPENDENT_AMBULATORY_CARE_PROVIDER_SITE_OTHER): Payer: Medicare Other | Admitting: Internal Medicine

## 2011-06-29 ENCOUNTER — Encounter: Payer: Self-pay | Admitting: Internal Medicine

## 2011-06-29 DIAGNOSIS — R799 Abnormal finding of blood chemistry, unspecified: Secondary | ICD-10-CM

## 2011-06-29 DIAGNOSIS — J449 Chronic obstructive pulmonary disease, unspecified: Secondary | ICD-10-CM

## 2011-06-29 DIAGNOSIS — R7989 Other specified abnormal findings of blood chemistry: Secondary | ICD-10-CM

## 2011-06-29 DIAGNOSIS — R569 Unspecified convulsions: Secondary | ICD-10-CM

## 2011-06-29 DIAGNOSIS — E559 Vitamin D deficiency, unspecified: Secondary | ICD-10-CM

## 2011-06-29 DIAGNOSIS — L989 Disorder of the skin and subcutaneous tissue, unspecified: Secondary | ICD-10-CM

## 2011-06-29 DIAGNOSIS — Z79899 Other long term (current) drug therapy: Secondary | ICD-10-CM

## 2011-06-29 LAB — CBC WITH DIFFERENTIAL/PLATELET
Basophils Absolute: 0.1 10*3/uL (ref 0.0–0.1)
Basophils Relative: 1 % (ref 0–1)
Eosinophils Absolute: 0.2 10*3/uL (ref 0.0–0.7)
Eosinophils Relative: 4 % (ref 0–5)
HCT: 40.9 % (ref 36.0–46.0)
Hemoglobin: 13.7 g/dL (ref 12.0–15.0)
Lymphocytes Relative: 45 % (ref 12–46)
Lymphs Abs: 2.2 10*3/uL (ref 0.7–4.0)
MCH: 29.7 pg (ref 26.0–34.0)
MCHC: 33.5 g/dL (ref 30.0–36.0)
MCV: 88.5 fL (ref 78.0–100.0)
Monocytes Absolute: 0.4 10*3/uL (ref 0.1–1.0)
Monocytes Relative: 9 % (ref 3–12)
Neutro Abs: 2 10*3/uL (ref 1.7–7.7)
Neutrophils Relative %: 41 % — ABNORMAL LOW (ref 43–77)
Platelets: 231 10*3/uL (ref 150–400)
RBC: 4.62 MIL/uL (ref 3.87–5.11)
RDW: 13.3 % (ref 11.5–15.5)
WBC: 4.9 10*3/uL (ref 4.0–10.5)

## 2011-06-29 LAB — COMPREHENSIVE METABOLIC PANEL
ALT: 27 U/L (ref 0–35)
AST: 40 U/L — ABNORMAL HIGH (ref 0–37)
Albumin: 4.3 g/dL (ref 3.5–5.2)
Alkaline Phosphatase: 98 U/L (ref 39–117)
BUN: 21 mg/dL (ref 6–23)
CO2: 22 mEq/L (ref 19–32)
Calcium: 9.9 mg/dL (ref 8.4–10.5)
Chloride: 105 mEq/L (ref 96–112)
Creat: 0.57 mg/dL (ref 0.50–1.10)
Glucose, Bld: 76 mg/dL (ref 70–99)
Potassium: 4 mEq/L (ref 3.5–5.3)
Sodium: 139 mEq/L (ref 135–145)
Total Bilirubin: 0.3 mg/dL (ref 0.3–1.2)
Total Protein: 9 g/dL — ABNORMAL HIGH (ref 6.0–8.3)

## 2011-06-29 MED ORDER — AMLODIPINE BESYLATE 10 MG PO TABS: 10.0000 mg | ORAL_TABLET | Freq: Every day | ORAL | Status: DC

## 2011-06-29 MED ORDER — AMPHETAMINE-DEXTROAMPHETAMINE 10 MG PO TABS: 10.0000 mg | ORAL_TABLET | Freq: Four times a day (QID) | ORAL | Status: DC

## 2011-06-29 MED ORDER — SIMVASTATIN 40 MG PO TABS: 40.0000 mg | ORAL_TABLET | Freq: Every day | ORAL | Status: DC

## 2011-06-29 MED ORDER — ERGOCALCIFEROL 1.25 MG (50000 UT) PO CAPS: 50000.0000 [IU] | ORAL_CAPSULE | ORAL | Status: DC

## 2011-06-30 LAB — PHENOBARBITAL LEVEL: Phenobarbital: 23.7 ug/mL (ref 15.0–40.0)

## 2011-06-30 LAB — THEOPHYLLINE LEVEL: Theophylline Lvl: 3.5 ug/mL — ABNORMAL LOW (ref 10.0–20.0)

## 2011-06-30 LAB — VITAMIN D 25 HYDROXY (VIT D DEFICIENCY, FRACTURES): Vit D, 25-Hydroxy: 53 ng/mL (ref 30–89)

## 2011-07-03 ENCOUNTER — Ambulatory Visit (INDEPENDENT_AMBULATORY_CARE_PROVIDER_SITE_OTHER): Payer: Medicare Other | Admitting: Surgery

## 2011-07-04 ENCOUNTER — Other Ambulatory Visit: Payer: Self-pay | Admitting: *Deleted

## 2011-07-04 DIAGNOSIS — R569 Unspecified convulsions: Secondary | ICD-10-CM

## 2011-07-04 NOTE — Telephone Encounter (Signed)
rf6 

## 2011-07-04 NOTE — Telephone Encounter (Signed)
Pt request refill of Forteo injection. States she takes 1 injection daily for 28 days each month. Please advise re: refill

## 2011-07-05 MED ORDER — TERIPARATIDE (RECOMBINANT) 600 MCG/2.4ML ~~LOC~~ SOLN: 20.0000 ug | Freq: Every day | SUBCUTANEOUS | Status: DC

## 2011-07-05 MED ORDER — INSULIN PEN NEEDLE 31G X 8 MM MISC: Status: DC

## 2011-07-05 NOTE — Telephone Encounter (Signed)
Rxs sent to [facility] for [employer] and pen needles.

## 2011-07-08 DIAGNOSIS — L989 Disorder of the skin and subcutaneous tissue, unspecified: Secondary | ICD-10-CM | POA: Insufficient documentation

## 2011-07-08 NOTE — Assessment & Plan Note (Signed)
Recommend observation. If doesn't resolve then dermatology

## 2011-07-08 NOTE — Progress Notes (Signed)
Subjective:    Patient ID: [name], female    DOB: [birth year], 78 y.o.   MRN: [id number]  HPI Pt presents to clinic for followup of multiple medical problems. Suffering from neuropathy now receiving ivig under direction of neurology for cidp. Location of sx's feet and hands. Has few wk h/o right check red skin lesion. Known h/o copd under care of pulmonology and take theophylline. H/o sz d/o without recent sz.  Reports past episode of phenobarbital toxicity. Old records reviewed with 2009 elevated cortisol levels. Appears to have been part of stim testing. Was at that time and continues to be on chronic steroid tx. H/o osteoporosis currently maintained on forteo the last 3 months.  Past Medical History  Diagnosis Date  . Anemia   . Anxiety     patient denied  . Depression     patient denied  . Asthma   . Chronic fatigue syndrome   . COPD (chronic obstructive pulmonary disease)   . GERD (gastroesophageal reflux disease)   . Hyperlipidemia   . Hypertension   . Colon polyp   . Allergic rhinitis   . Vitamin d deficiency   . Pulmonary embolism   . Skin cancer     squamous cell  . Seizures   . Pneumonia   . Neuropathy   . Arthritis   . Chronic inflammatory demyelinating polyneuropathy 03/2011   Past Surgical History  Procedure Date  . Tonsillectomy and adenoidectomy   . Nasal septum surgery   . Dilation and curettage of uterus   . Tubal ligation   . Cesarean section   . Sinus surgeries     x 4  . Left ovary and tube removed   . Vocal polyps removed   . Appendectomy   . Cystocele repair   . Carpal tunnel release     right  . Shoulder arthroscopy     x2 left, 1 right  . Left finger fusion   . Right median nerve decompression   . Duptyren's contracture right hand   . Finger ganglion cyst excision     right  . Abdominal hysterectomy   . Joint replacement     right and left basal joints of thumbs  . Bladder suspension   . Cataract extraction     bilateral  .  Cervical neck ablation     x 7, C3-C4  . Squamous lesions removed     neck and face  . Basal cell carcinoma excision     face    reports that she quit smoking about 33 years ago. Her smoking use included Cigarettes. She has a 1.5 pack-year smoking history. She has never used smokeless tobacco. She reports that she does not drink alcohol or use illicit drugs. family history includes Allergies in her sister; Colon cancer in her father; Heart disease in her mother; Hyperlipidemia in her maternal aunt and mother; Lung cancer in her sister; Ovarian cancer in her sister; Stomach cancer in an unspecified family member; and Thyroid cancer in her sister. Allergies  Allergen Reactions  . Iodine     Cardiac arrest  . Morphine And Related     Cardiac arrest      Review of Systems see hpi     Objective:   Physical Exam  Nursing note and vitals reviewed. Constitutional: She appears well-developed and well-nourished.  HENT:  Head: Normocephalic and atraumatic.  Neurological: She is alert.  Skin:       Right cheek- 4mm sl red  skin lesion  Psychiatric: She has a normal mood and affect.          Assessment & Plan:

## 2011-07-08 NOTE — Assessment & Plan Note (Signed)
Reviewed elevated cortisol levels likely due to steroid use.

## 2011-07-08 NOTE — Assessment & Plan Note (Signed)
Obtain vit d level 

## 2011-07-08 NOTE — Assessment & Plan Note (Signed)
Obtain theophylline level

## 2011-07-08 NOTE — Assessment & Plan Note (Signed)
Obtain phenobarbital level 

## 2011-07-10 ENCOUNTER — Ambulatory Visit: Payer: Medicare Other | Admitting: Internal Medicine

## 2011-07-11 ENCOUNTER — Other Ambulatory Visit: Payer: Self-pay | Admitting: *Deleted

## 2011-07-11 DIAGNOSIS — R569 Unspecified convulsions: Secondary | ICD-10-CM

## 2011-07-12 ENCOUNTER — Encounter: Payer: Self-pay | Admitting: Internal Medicine

## 2011-07-12 LAB — PHENOBARBITAL LEVEL: Phenobarbital: 47.1 ug/mL — ABNORMAL HIGH (ref 15.0–40.0)

## 2011-07-13 ENCOUNTER — Telehealth: Payer: Self-pay | Admitting: Internal Medicine

## 2011-07-13 DIAGNOSIS — R569 Unspecified convulsions: Secondary | ICD-10-CM

## 2011-07-13 DIAGNOSIS — Z79899 Other long term (current) drug therapy: Secondary | ICD-10-CM

## 2011-07-13 NOTE — Telephone Encounter (Signed)
1) would prolia be cheaper. ?reclast infusions 2) suggestion based on potential medication interactions- resume previous phenobarbital dosing and repeat phenobarbital and theophylline levels (both) in ~10 days (v58.69)

## 2011-07-13 NOTE — Telephone Encounter (Signed)
Patient  needs Rx for Phenobarbital. Is there another injectable that is less expensive & that insurance will cover for the Teriparatide? Please contact patient

## 2011-07-13 NOTE — Telephone Encounter (Signed)
Patient called back, she found out she can get the Teriparatide through her mail order company [facility] for a whole lot less $$$, she said they are going to send you a fax to get the prior approval which has to be returned within 72 hours.

## 2011-07-14 NOTE — Telephone Encounter (Signed)
Form sent to provider for completion

## 2011-07-14 NOTE — Telephone Encounter (Signed)
Call returned to patient at [phone], no answer. A voice message was left for patient to return phone call regarding medication instructions.

## 2011-07-14 NOTE — Telephone Encounter (Signed)
Patient returned phone call, she was informed per Dr Rodena Medin instructions and has verbalized understanding. She states her insurance will cover Forteo for 95. She would like a 3 month supply. The Rx for Phenobarbital she states she picked up a Rx for three times a day; however her old Rx was four times a day. She was advised to resume four times per day per Dr Rodena Medin. ( 32.4 mg 2 in the am , 1 in the afternoon and 1 in the evening)

## 2011-07-14 NOTE — Telephone Encounter (Signed)
Patient called back [facility] is waiting for our fax, their fax # is [phone]. The insurance company only has a 72 hour window that started yesterday.

## 2011-07-17 ENCOUNTER — Ambulatory Visit (INDEPENDENT_AMBULATORY_CARE_PROVIDER_SITE_OTHER): Payer: Medicare Other | Admitting: General Surgery

## 2011-07-17 ENCOUNTER — Encounter (INDEPENDENT_AMBULATORY_CARE_PROVIDER_SITE_OTHER): Payer: Self-pay | Admitting: General Surgery

## 2011-07-17 DIAGNOSIS — K5900 Constipation, unspecified: Secondary | ICD-10-CM

## 2011-07-17 DIAGNOSIS — D126 Benign neoplasm of colon, unspecified: Secondary | ICD-10-CM

## 2011-07-17 DIAGNOSIS — N816 Rectocele: Secondary | ICD-10-CM

## 2011-07-17 DIAGNOSIS — K5909 Other constipation: Secondary | ICD-10-CM | POA: Insufficient documentation

## 2011-07-17 NOTE — Progress Notes (Signed)
Chief Complaint  Patient presents with  . New Evaluation    Polyp of cecum, tubular adenoma    HISTORY: Pt presents with villous adenoma found on colonoscopy performed for chronic constipation.  She had fecal incontinence and known rectocele.  She had been recommended to have upper and lower endoscopy previously, but had not done so.  She also has complaints of dysphagia and GERD.  She occasionally has blood in her stools, but she describes this as occurring when her stools are very hard.  She is unable to feel when she needs to have a bowel movement.  She wears adult garments for the incontinence.   She also has neuropathy of unclear etiology.  She has radicular pain, numbness and weakness.  This has been evaluated by a neurologist and she is receiving IVIG.  She is on narcotics for the chronic pain.   She has a family history of colon cancer.   Past Medical History  Diagnosis Date  . Anemia   . Anxiety     patient denied  . Depression     patient denied  . Asthma   . Chronic fatigue syndrome   . COPD (chronic obstructive pulmonary disease)   . GERD (gastroesophageal reflux disease)   . Hyperlipidemia   . Hypertension   . Colon polyp   . Allergic rhinitis   . Vitamin d deficiency   . Pulmonary embolism   . Skin cancer     squamous cell  . Seizures   . Pneumonia   . Neuropathy   . Arthritis   . 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 Blood transfusion   . Osteoporosis   . Thyroid disease   . Constipation   . Weakness     Past Surgical History  Procedure Date  . Tonsillectomy and adenoidectomy   . Nasal septum surgery   . Dilation and curettage of uterus   . Tubal ligation   . Cesarean section   . Sinus surgeries     x 4  . Left ovary and tube removed   . Vocal polyps removed   . Appendectomy   . Cystocele repair   . Carpal tunnel release     right  . Shoulder arthroscopy     x2 left, 1 right  . Left finger fusion   . Right median nerve  decompression   . Duptyren's contracture right hand   . Finger ganglion cyst excision     right  . Abdominal hysterectomy   . Joint replacement     right and left basal joints of thumbs  . Bladder suspension   . Cataract extraction     bilateral  . Cervical neck ablation     x 7, C3-C4  . Squamous lesions removed     neck and face  . Basal cell carcinoma excision     face  Panniculectomy  Current Outpatient Prescriptions  Medication Sig Dispense Refill  . DEXILANT 60 MG capsule Daily.      . ondansetron (ZOFRAN-ODT) 4 MG disintegrating tablet as needed.      . OXYCONTIN 30 MG TB12 Daily.      Marland Kitchen albuterol (PROAIR HFA) 108 (90 BASE) MCG/ACT inhaler Inhale 2 puffs into the lungs every 6 (six) hours as needed.        Marland Kitchen albuterol (PROVENTIL) (2.5 MG/3ML) 0.083% nebulizer solution Take 2.5 mg by nebulization every 6 (six) hours as needed.        Marland Kitchen amLODipine (NORVASC) 10 MG  tablet Take 1 tablet (10 mg total) by mouth daily.  30 tablet  6  . amphetamine-dextroamphetamine (ADDERALL) 10 MG tablet Take 1 tablet (10 mg total) by mouth 4 (four) times daily.  120 tablet  0  . CALCIUM-VITAMIN D PO Take by mouth daily.        . cetirizine (ZYRTEC) 10 MG tablet Take 10 mg by mouth daily.        . Chlorpheniramine Maleate (CHLOR-TRIMETON PO) Take 1 tablet by mouth daily.        . diclofenac sodium (VOLTAREN) 1 % GEL Apply topically as needed.       . ergocalciferol (VITAMIN D2) 50000 UNITS capsule Take 1 capsule (50,000 Units total) by mouth once a week.  5 capsule  6  . estradiol (ESTRACE) 2 MG tablet Take 2 mg by mouth daily.        Marland Kitchen HYDROcodone-homatropine (HYDROMET) 5-1.5 MG/5ML syrup Take by mouth as needed.        . Insulin Pen Needle (B-D ULTRAFINE III SHORT PEN) 31G X 8 MM MISC Use to give Forteo injection once a day for 28 days.  30 each  5  . lidocaine (LIDODERM) 5 % Place 2 patches onto the skin daily. Remove & Discard patch within 12 hours or as directed by MD  60 patch  6  .  mometasone (NASONEX) 50 MCG/ACT nasal spray Place 2 sprays into the nose daily.        . mometasone-formoterol (DULERA) 100-5 MCG/ACT AERO Inhale 2 puffs into the lungs 2 (two) times daily.      . montelukast (SINGULAIR) 10 MG tablet Take 10 mg by mouth at bedtime.        . ondansetron (ZOFRAN) 4 MG tablet Take 4 mg by mouth as needed.        Marland Kitchen oxyCODONE-acetaminophen (PERCOCET) 10-325 MG per tablet Take 1 tablet by mouth every 4 (four) hours as needed.        Marland Kitchen PHENobarbital (LUMINAL) 32.4 MG tablet 2 in the morning, 1 at noon, 1 at bedtime  120 tablet  3  . polyethylene glycol (MIRALAX / GLYCOLAX) packet Take 17 g by mouth daily.      . predniSONE (DELTASONE) 20 MG tablet Take 65 mg by mouth daily.       . simvastatin (ZOCOR) 40 MG tablet Take 1 tablet (40 mg total) by mouth at bedtime.  30 tablet  6  . Teriparatide, Recombinant, (FORTEO) 600 MCG/2.4ML SOLN Inject 0.08 mLs (20 mcg total) into the skin daily. Inject subcutaneously once a day for 28 days.  2.4 mL  5  . theophylline (THEO-24) 200 MG 24 hr capsule Take 200 mg by mouth 2 (two) times daily.          Allergies  Allergen Reactions  . Iodine Other (See Comments)    Cardiac arrest  . Morphine And Related Other (See Comments)    Cardiac arrest     Family History  Problem Relation Age of Onset  . Heart disease Mother   . Hyperlipidemia Mother   . Allergies Sister   . Cancer Sister     ovarian  . Ovarian cancer Sister   . Cancer Sister     lung - stage 1  . Thyroid cancer Sister   . Cancer Sister     thyroid - stage 1  . Lung cancer Sister   . Colon cancer Father   . Cancer Father     colon  .  Stomach cancer      first cousin  . Hyperlipidemia Maternal Aunt   . Cancer Cousin     stomach     History   Social History  . Marital Status: Married    Spouse Name: N/A    Number of Children: 3  . Years of Education: N/A   Occupational History  . retired    Social History Main Topics  . Smoking status:  Former Smoker -- 0.5 packs/day for 3 years    Types: Cigarettes    Quit date: 05/29/1978  . Smokeless tobacco: Never Used  . Alcohol Use: No  . Drug Use: No  . Sexually Active: None   Other Topics Concern  . None   Social History Narrative  . None     REVIEW OF SYSTEMS - PERTINENT POSITIVES ONLY: 12 point review of systems negative other than HPI and PMH except for arthritis pain and fatigue.    EXAM: Filed Vitals:   07/17/11 0903  BP: 126/72  Pulse: 70  Temp: 98.4 F (36.9 C)  Resp: 18    Gen:  No acute distress.  Well nourished and well groomed.   Neurological: Alert and oriented to person, place, and time. Coordination decreased.  Decreased motor/sensation in BUE and BLE.  Does not grip hands.    Head: Normocephalic and atraumatic.  Eyes: Conjunctivae are normal. Pupils are equal, round, and reactive to light. No scleral icterus.  Neck: Decreased range of motion. Neck supple. No tracheal deviation or thyromegaly present.  Cardiovascular: Normal rate, regular rhythm, normal heart sounds and intact distal pulses.  Exam reveals no gallop and no friction rub.  No murmur heard. Respiratory: Effort normal.  No respiratory distress. No chest wall tenderness. Breath sounds normal.  No wheezes, rales or rhonchi.  GI: Soft. Bowel sounds are normal. The abdomen is soft and nontender.  There is no rebound and no guarding. Panniculectomy scar present.  No signs of hernia. Musculoskeletal: Normal range of motion. Extremities are nontender. Chronic changes of hand joints.   Lymphadenopathy: No cervical, preauricular, postauricular or axillary adenopathy is present Skin: Skin is warm and dry. No rash noted. No diaphoresis. No erythema. No pallor. No clubbing, cyanosis, or edema.   Psychiatric: Normal mood and affect. Behavior is normal. Judgment and thought content normal.    LABORATORY RESULTS: CBC, CMET essentially normal 06/29/2011   PATHOLOGY RESULTS: 3. Surgical [P], cecum,  polyp - TUBULAR ADENOMA (ALL FRAGMENTS). - NO HIGH GRADE DYSPLASIA OR MALIGNANCY IDENTIFIED    ASSESSMENT AND PLAN: Adenoma of cecum This will need to be surgically resected.   Will certainly try to do laparoscopically. The patient has had significant intraabdominal surgery including an exlap for ruptured appendicitis.  She may have adhesions that prohibit a laparoscopic approach.   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    She reportedly has a complex renal cyst on her CT scan.  She is going to establish whether this will need surgical intervention before we plan colectomy  I am unsure what role neuropathy and rectocele will play in her recovery.    I discussed risks of surgery including  Bleeding, infection, damage to other structures, possible open operation, possible occult cancer in polyp, possible anastamotic leak, wound infection, urinary infection, weakness, and prolonged ileus.    She understands and wished to proceed.   She will call when she discusses surgery with her neurologist and after the issue of the  renal cyst is resolved.   I have left orders for surgery.  Rectocele It is not clear whether her difficulties with incontinence are due to her neuropathy or her rectocele.  I advised her that I would not be addressing this surgically.  I would recommend seeing a GI or colorectal incontinence specialist to assess her anal sphincter function and perhaps try biofeedback before seeking surgical options.      Chronic constipation This is likely multifactorial, but the prominent factor is likely chronic narcotic use. I advised that this may slow down her recovery time and increase her likelihood of post op ileus.          Maudry Diego MD Surgical Oncology, General and Endocrine Surgery [facility]      Visit Diagnoses: 1. Adenoma of cecum   2. Rectocele   3. Chronic constipation     Primary  Care Physician: Letitia Libra, Ala Dach, MD, MD

## 2011-07-17 NOTE — Assessment & Plan Note (Addendum)
This is likely multifactorial, but the prominent factor is likely chronic narcotic use. I advised that this may slow down her recovery time and increase her likelihood of post op ileus.

## 2011-07-17 NOTE — Assessment & Plan Note (Signed)
It is not clear whether her difficulties with incontinence are due to her neuropathy or her rectocele.  I advised her that I would not be addressing this surgically.  I would recommend seeing a GI or colorectal incontinence specialist to assess her anal sphincter function and perhaps try biofeedback before seeking surgical options.

## 2011-07-17 NOTE — Patient Instructions (Signed)
See urologist about complex renal cyst.  Follow up with neurologist  When you are ready to schedule surgery, call our schedulers.  I will need to see you within 30 days of surgery.

## 2011-07-17 NOTE — Assessment & Plan Note (Addendum)
This will need to be surgically resected.   Will certainly try to do laparoscopically. The patient has had significant intraabdominal surgery including an exlap for ruptured appendicitis.  She may have adhesions that prohibit a laparoscopic approach.   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    She reportedly has a complex renal cyst on her CT scan.  She is going to establish whether this will need surgical intervention before we plan colectomy  I am unsure what role neuropathy and rectocele will play in her recovery.    I discussed risks of surgery including  Bleeding, infection, damage to other structures, possible open operation, possible occult cancer in polyp, possible anastamotic leak, wound infection, urinary infection, weakness, and prolonged ileus.    She understands and wished to proceed.   She will call when she discusses surgery with her neurologist and after the issue of the renal cyst is resolved.   I have left orders for surgery.

## 2011-07-18 ENCOUNTER — Telehealth: Payer: Self-pay | Admitting: Internal Medicine

## 2011-07-18 NOTE — Telephone Encounter (Signed)
In order to process the prior auth for her forteo the insurance company needs her diagnosis the medicines she has tried and failed for this problem her t scor and if she has any history of fractures .  Please call Darl Pikes with [facility] [phone]

## 2011-07-18 NOTE — Telephone Encounter (Signed)
Form completed and faxed to [facility] [phone], awaiting approval/denial status.

## 2011-07-18 NOTE — Telephone Encounter (Signed)
See phone created for 07/18/2011.

## 2011-07-20 NOTE — Telephone Encounter (Signed)
See previous note

## 2011-07-21 ENCOUNTER — Other Ambulatory Visit: Payer: Self-pay | Admitting: *Deleted

## 2011-07-21 ENCOUNTER — Telehealth: Payer: Self-pay | Admitting: Internal Medicine

## 2011-07-21 DIAGNOSIS — Z79899 Other long term (current) drug therapy: Secondary | ICD-10-CM

## 2011-07-21 LAB — THEOPHYLLINE LEVEL: Theophylline Lvl: 4.4 ug/mL — ABNORMAL LOW (ref 10.0–20.0)

## 2011-07-21 LAB — PHENOBARBITAL LEVEL: Phenobarbital: 27.6 ug/mL (ref 15.0–40.0)

## 2011-07-21 MED ORDER — TERIPARATIDE (RECOMBINANT) 600 MCG/2.4ML ~~LOC~~ SOLN: 20.0000 ug | Freq: Every day | SUBCUTANEOUS | Status: DC

## 2011-07-21 MED ORDER — ONDANSETRON 4 MG PO TBDP: 4.0000 mg | ORAL_TABLET | Freq: Three times a day (TID) | ORAL | Status: DC | PRN

## 2011-07-21 NOTE — Telephone Encounter (Signed)
Is it okay to provide refill for patient? 

## 2011-07-21 NOTE — Telephone Encounter (Signed)
Call placed to patient at [phone], no answer. A voice message was left for patient to return call to inform if she has obtained Rx for Forteo.

## 2011-07-21 NOTE — Telephone Encounter (Signed)
Yes rf1

## 2011-07-21 NOTE — Telephone Encounter (Signed)
Patient presented to office for blood work. She was asked if she received the Rx for Forteo. She states she has not. The information requested from the provider was not sent in a timely manner, and she has requested  an appeal. She was informed the additional information was submitted to insurance company,and no response has been received by office.  The Rx would be resent to pharmacy. She was advised to check with pharmacy regarding the approval status. She states that insurance company would provide a decision to her within 7 days. No further action required.

## 2011-07-21 NOTE — Telephone Encounter (Signed)
Rx refill sent to pharmacy. 

## 2011-07-24 ENCOUNTER — Encounter: Payer: Self-pay | Admitting: Critical Care Medicine

## 2011-07-24 ENCOUNTER — Ambulatory Visit (INDEPENDENT_AMBULATORY_CARE_PROVIDER_SITE_OTHER): Payer: PRIVATE HEALTH INSURANCE | Admitting: Critical Care Medicine

## 2011-07-24 DIAGNOSIS — M810 Age-related osteoporosis without current pathological fracture: Secondary | ICD-10-CM

## 2011-07-24 DIAGNOSIS — J449 Chronic obstructive pulmonary disease, unspecified: Secondary | ICD-10-CM

## 2011-07-24 NOTE — Patient Instructions (Addendum)
You are clear for surgery Take your inhaler and nebulizer the morning of the surgery Call us with the name of your anesthesia MD Call us with the date of surgery Return as needed after surgery, we will see you in the hospital You will need to receive IV SoluCortef 100mg  Preop and postop every 6 hours

## 2011-07-24 NOTE — Assessment & Plan Note (Addendum)
Golds D  Copd not oxygen dependent but steroid dependent Significant lifelong asthmatic atopic features Large dose of maintenance prednisone required Now needs colon resection for adenoma of colon Plan No change in inhaled or maintenance medications. The pt is cleared for planned surgery The pt would need stress steroids pre and post op, she is on 65mg /d prednisone !!

## 2011-07-24 NOTE — Progress Notes (Signed)
Subjective:    Patient ID: [name], female    DOB: [birth year], 78 y.o.   MRN: [id number]  HPI Comments: I   78 y.o. WF Prior dx copd needs Cspine neck fusion.  Dx 1980 with copd.  Has never been less than 50mg  /d prednisone   07/24/2011 Not seen since 12/2010.  Now needs Colon polyp removal At last OV we assessed/recommended: Golds IV Copd not oxygen dependent but steroid dependent Significant lifelong asthmatic atopic features Large dose of maintenance prednisone required Needs Cspine surgery C3-4-5 Plan Use Dulera two puff twice daily Use nebulizer as needed Stop budesonide in nebulizer No change in prednisone You may be cleared for spinal surgery Pt plans surgery in Jan 2013 d/t insurance reasons  Did not have neck, neurology changed Dx to CIDP,  Peripheral neuropathy.   Neck surgery was put off.  Colon polyp needs to be removed. Dyspnea has been stable.  Takes only two neb meds Rx /day. No real cough.   On pred 65mg /d  Needs laproscopic surgery to remove the colon polyp.  Past Medical History  Diagnosis Date  . Anemia   . Anxiety     patient denied  . Depression     patient denied  . Asthma   . Chronic fatigue syndrome   . COPD (chronic obstructive pulmonary disease)   . GERD (gastroesophageal reflux disease)   . Hyperlipidemia   . Hypertension   . Colon polyp   . Allergic rhinitis   . Vitamin d deficiency   . Pulmonary embolism   . Skin cancer     squamous cell  . Seizures   . Pneumonia   . Neuropathy   . Arthritis   . 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 Blood transfusion   . Osteoporosis   . Thyroid disease   . Constipation   . Weakness      Family History  Problem Relation Age of Onset  . Heart disease Mother   . Hyperlipidemia Mother   . Allergies Sister   . Cancer Sister     ovarian  . Ovarian cancer Sister   . Cancer Sister     lung - stage 1  . Thyroid cancer Sister   . Cancer Sister     thyroid -  stage 1  . Lung cancer Sister   . Colon cancer Father   . Cancer Father     colon  . Stomach cancer      first cousin  . Hyperlipidemia Maternal Aunt   . Cancer Cousin     stomach     History   Social History  . Marital Status: Married    Spouse Name: N/A    Number of Children: 3  . Years of Education: N/A   Occupational History  . retired    Social History Main Topics  . Smoking status: Former Smoker -- 0.5 packs/day for 3 years    Types: Cigarettes    Quit date: 05/29/1978  . Smokeless tobacco: Never Used  . Alcohol Use: No  . Drug Use: No  . Sexually Active: Not on file   Other Topics Concern  . Not on file   Social History Narrative  . No narrative on file     Allergies  Allergen Reactions  . Iodine Other (See Comments)    Cardiac arrest  . Morphine And Related Other (See Comments)    Cardiac arrest     Outpatient Prescriptions Prior to Visit  Medication  Sig Dispense Refill  . albuterol (PROAIR HFA) 108 (90 BASE) MCG/ACT inhaler Inhale 2 puffs into the lungs every 6 (six) hours as needed.        Marland Kitchen albuterol (PROVENTIL) (2.5 MG/3ML) 0.083% nebulizer solution Take 2.5 mg by nebulization every 6 (six) hours as needed.        Marland Kitchen amLODipine (NORVASC) 10 MG tablet Take 1 tablet (10 mg total) by mouth daily.  30 tablet  6  . amphetamine-dextroamphetamine (ADDERALL) 10 MG tablet Take 1 tablet (10 mg total) by mouth 4 (four) times daily.  120 tablet  0  . CALCIUM-VITAMIN D PO Take by mouth daily.        . cetirizine (ZYRTEC) 10 MG tablet Take 10 mg by mouth daily.        Marland Kitchen DEXILANT 60 MG capsule Daily.      . diclofenac sodium (VOLTAREN) 1 % GEL Apply topically as needed.       . ergocalciferol (VITAMIN D2) 50000 UNITS capsule Take 1 capsule (50,000 Units total) by mouth once a week.  5 capsule  6  . HYDROcodone-homatropine (HYDROMET) 5-1.5 MG/5ML syrup Take by mouth as needed.        . Insulin Pen Needle (B-D ULTRAFINE III SHORT PEN) 31G X 8 MM MISC Use to give  Forteo injection once a day for 28 days.  30 each  5  . lidocaine (LIDODERM) 5 % Place 2 patches onto the skin daily. Remove & Discard patch within 12 hours or as directed by MD  60 patch  6  . mometasone (NASONEX) 50 MCG/ACT nasal spray Place 2 sprays into the nose daily.        . mometasone-formoterol (DULERA) 100-5 MCG/ACT AERO Inhale 2 puffs into the lungs 2 (two) times daily.      . montelukast (SINGULAIR) 10 MG tablet Take 10 mg by mouth at bedtime.        Marland Kitchen oxyCODONE-acetaminophen (PERCOCET) 10-325 MG per tablet Take 1 tablet by mouth every 4 (four) hours as needed.        . OXYCONTIN 30 MG TB12 Daily.      Marland Kitchen PHENobarbital (LUMINAL) 32.4 MG tablet 2 in the morning, 1 at noon, 1 at bedtime  120 tablet  3  . polyethylene glycol (MIRALAX / GLYCOLAX) packet Take 17 g by mouth daily.      . predniSONE (DELTASONE) 20 MG tablet Take 65 mg by mouth daily.       . simvastatin (ZOCOR) 40 MG tablet Take 1 tablet (40 mg total) by mouth at bedtime.  30 tablet  6  . Teriparatide, Recombinant, (FORTEO) 600 MCG/2.4ML SOLN Inject 0.08 mLs (20 mcg total) into the skin daily. Inject subcutaneously once a day for 28 days.  2.4 mL  5  . estradiol (ESTRACE) 2 MG tablet Take 2 mg by mouth every other day.       . theophylline (THEO-24) 200 MG 24 hr capsule Take 200 mg by mouth 2 (two) times daily.       . Chlorpheniramine Maleate (CHLOR-TRIMETON PO) Take 1 tablet by mouth daily.        . ondansetron (ZOFRAN) 4 MG tablet Take 4 mg by mouth as needed.        . ondansetron (ZOFRAN-ODT) 4 MG disintegrating tablet Take 1 tablet (4 mg total) by mouth every 8 (eight) hours as needed for nausea.  30 tablet  0     Review of Systems  Constitutional: Negative for chills,  diaphoresis, activity change, appetite change, fatigue and unexpected weight change.  HENT: Positive for hearing loss, congestion and neck stiffness. Negative for nosebleeds, facial swelling, sneezing, mouth sores, trouble swallowing, dental  problem, voice change, postnasal drip, sinus pressure, tinnitus and ear discharge.   Eyes: Negative for photophobia, discharge, itching and visual disturbance.  Respiratory: Positive for cough and chest tightness. Negative for apnea, choking and stridor.   Cardiovascular: Negative for palpitations.  Gastrointestinal: Positive for constipation and blood in stool. Negative for nausea and abdominal distention.       Hx of rectocoele   Genitourinary: Positive for urgency, frequency and decreased urine volume. Negative for dysuria, hematuria, flank pain and difficulty urinating.  Musculoskeletal: Positive for back pain, joint swelling, arthralgias and gait problem. Negative for myalgias.  Skin: Negative for color change and pallor.  Neurological: Positive for weakness and numbness. Negative for dizziness, tremors, seizures, syncope, speech difficulty and light-headedness.  Hematological: Negative for adenopathy. Bruises/bleeds easily.  Psychiatric/Behavioral: Negative for confusion, sleep disturbance and agitation. The patient is not nervous/anxious.        Objective:   Physical Exam  Filed Vitals:   07/24/11 0925  BP: 128/84  Pulse: 114  Temp: 97.7 F (36.5 C)  TempSrc: Oral  Height: 5\' 2"  (1.575 m)  Weight: 128 lb (58.06 kg)  SpO2: 99%    Gen: Pleasant, well-nourished, in no distress,  normal affect  ENT: No lesions,  mouth clear,  oropharynx clear, no postnasal drip  Neck: No JVD, no TMG, no carotid bruits  Lungs: No use of accessory muscles, no dullness to percussion, distant BS  Cardiovascular: RRR, heart sounds normal, no murmur or gallops, no peripheral edema  Abdomen: soft and NT, no HSM,  BS normal  Musculoskeletal: No deformities, no cyanosis or clubbing  Neuro: alert, non focal  Skin: Warm, no lesions or rashes   8/20 Cleda Daub: FeV1 53%  Fef 25 75 20%  8/20: CXR: NAD, copd changes    Assessment & Plan:   COPD (chronic obstructive pulmonary disease) Golds D   Copd not oxygen dependent but steroid dependent Significant lifelong asthmatic atopic features Large dose of maintenance prednisone required Now needs colon resection for adenoma of colon Plan No change in inhaled or maintenance medications. The pt is cleared for planned surgery The pt would need stress steroids pre and post op, she is on 65mg /d prednisone !!     Updated Medication List Outpatient Encounter Prescriptions as of 07/24/2011  Medication Sig Dispense Refill  . albuterol (PROAIR HFA) 108 (90 BASE) MCG/ACT inhaler Inhale 2 puffs into the lungs every 6 (six) hours as needed.        Marland Kitchen albuterol (PROVENTIL) (2.5 MG/3ML) 0.083% nebulizer solution Take 2.5 mg by nebulization every 6 (six) hours as needed.        Marland Kitchen amLODipine (NORVASC) 10 MG tablet Take 1 tablet (10 mg total) by mouth daily.  30 tablet  6  . amphetamine-dextroamphetamine (ADDERALL) 10 MG tablet Take 1 tablet (10 mg total) by mouth 4 (four) times daily.  120 tablet  0  . CALCIUM-VITAMIN D PO Take by mouth daily.        . cetirizine (ZYRTEC) 10 MG tablet Take 10 mg by mouth daily.        Marland Kitchen DEXILANT 60 MG capsule Daily.      . diclofenac sodium (VOLTAREN) 1 % GEL Apply topically as needed.       . ergocalciferol (VITAMIN D2) 50000 UNITS capsule Take 1 capsule (50,000  Units total) by mouth once a week.  5 capsule  6  . HYDROcodone-homatropine (HYDROMET) 5-1.5 MG/5ML syrup Take by mouth as needed.        . Insulin Pen Needle (B-D ULTRAFINE III SHORT PEN) 31G X 8 MM MISC Use to give Forteo injection once a day for 28 days.  30 each  5  . lidocaine (LIDODERM) 5 % Place 2 patches onto the skin daily. Remove & Discard patch within 12 hours or as directed by MD  60 patch  6  . mometasone (NASONEX) 50 MCG/ACT nasal spray Place 2 sprays into the nose daily.        . mometasone-formoterol (DULERA) 100-5 MCG/ACT AERO Inhale 2 puffs into the lungs 2 (two) times daily.      . montelukast (SINGULAIR) 10 MG tablet Take 10 mg by mouth  at bedtime.        Marland Kitchen oxyCODONE-acetaminophen (PERCOCET) 10-325 MG per tablet Take 1 tablet by mouth every 4 (four) hours as needed.        . OXYCONTIN 30 MG TB12 Daily.      Marland Kitchen PHENobarbital (LUMINAL) 32.4 MG tablet 2 in the morning, 1 at noon, 1 at bedtime  120 tablet  3  . polyethylene glycol (MIRALAX / GLYCOLAX) packet Take 17 g by mouth daily.      . predniSONE (DELTASONE) 20 MG tablet Take 65 mg by mouth daily.       . simvastatin (ZOCOR) 40 MG tablet Take 1 tablet (40 mg total) by mouth at bedtime.  30 tablet  6  . Teriparatide, Recombinant, (FORTEO) 600 MCG/2.4ML SOLN Inject 0.08 mLs (20 mcg total) into the skin daily. Inject subcutaneously once a day for 28 days.  2.4 mL  5  . theophylline (THEODUR) 100 MG 12 hr tablet Take 100 mg by mouth. 4 tabs daily      . DISCONTD: estradiol (ESTRACE) 2 MG tablet Take 2 mg by mouth every other day.       Marland Kitchen DISCONTD: theophylline (THEO-24) 200 MG 24 hr capsule Take 200 mg by mouth 2 (two) times daily.       Marland Kitchen DISCONTD: Chlorpheniramine Maleate (CHLOR-TRIMETON PO) Take 1 tablet by mouth daily.        Marland Kitchen DISCONTD: ondansetron (ZOFRAN) 4 MG tablet Take 4 mg by mouth as needed.        Marland Kitchen DISCONTD: ondansetron (ZOFRAN-ODT) 4 MG disintegrating tablet Take 1 tablet (4 mg total) by mouth every 8 (eight) hours as needed for nausea.  30 tablet  0

## 2011-07-25 ENCOUNTER — Telehealth: Payer: Self-pay | Admitting: *Deleted

## 2011-07-25 DIAGNOSIS — R569 Unspecified convulsions: Secondary | ICD-10-CM

## 2011-07-25 NOTE — Telephone Encounter (Signed)
Patient returned phone call and verified she is taking theophylline 100 mg four times a day.

## 2011-07-25 NOTE — Telephone Encounter (Signed)
Call placed to patient at [phone], no answer. A voice message was left for patient to return phone call regarding lab results. Verification is needed on current Theophylline dose, before medication change.

## 2011-07-25 NOTE — Telephone Encounter (Signed)
Add extra 100mg  to 1st and second doses. Check one week

## 2011-07-25 NOTE — Telephone Encounter (Signed)
Patient returned call and left voice message stating she is taking 100 mg four times a day.  Call was returned to patient [phone], no answer. A voice message was left for patient to return call regarding medication.

## 2011-07-25 NOTE — Telephone Encounter (Signed)
Call placed to patient at [phone], she was informed per Dr Rodena Medin instructions and has verbalized understanding.She will make dose change on Wednesday 07/26/2011 and recheck levels one week later August 02, 2011.

## 2011-07-31 ENCOUNTER — Other Ambulatory Visit (INDEPENDENT_AMBULATORY_CARE_PROVIDER_SITE_OTHER): Payer: Self-pay | Admitting: General Surgery

## 2011-08-01 ENCOUNTER — Other Ambulatory Visit: Payer: Self-pay | Admitting: *Deleted

## 2011-08-01 DIAGNOSIS — R569 Unspecified convulsions: Secondary | ICD-10-CM

## 2011-08-01 MED ORDER — AMPHETAMINE-DEXTROAMPHETAMINE 10 MG PO TABS: 10.0000 mg | ORAL_TABLET | Freq: Four times a day (QID) | ORAL | Status: DC

## 2011-08-01 NOTE — Telephone Encounter (Signed)
Patient called and left voice message stating she will be in for blood work tomorrow and she would like to know if Dr Rodena Medin would refill her Rx for Adderrall.

## 2011-08-01 NOTE — Telephone Encounter (Signed)
ok 

## 2011-08-01 NOTE — Telephone Encounter (Signed)
Rx printed and available for patient pick up.

## 2011-08-02 NOTE — Telephone Encounter (Signed)
Patient stopped by office and has acquired Rx for Adderrall.

## 2011-08-03 ENCOUNTER — Telehealth (INDEPENDENT_AMBULATORY_CARE_PROVIDER_SITE_OTHER): Payer: Self-pay | Admitting: General Surgery

## 2011-08-03 LAB — PHENOBARBITAL LEVEL: Phenobarbital: 33.8 ug/mL (ref 15.0–40.0)

## 2011-08-03 LAB — THEOPHYLLINE LEVEL: Theophylline Lvl: 7.8 ug/mL — ABNORMAL LOW (ref 10.0–20.0)

## 2011-08-03 NOTE — Telephone Encounter (Signed)
[name] CALLED CONCERNING SURGERY SCHEDULING/ SHE SAID SHE HAD LEFT SEVERAL MESSAGES FOR SCHEDULING RE ORDERS ENTERED BY D. BYERLY/ I HAD AMY IN SCHEDULING CK OR ORDERS AND SHE FOUND THEM IN EPIC/ SHE WILL WORK ON SCHEDULING SURGERY FOR [name]/ AND THEN SHE WILL NEED COVER/ORDER SHEET FILLED OUT AND GIVEN TO HER ON Monday TO COMPLETE THE PROCESS. THANKS GY/PT DID DROP OFF COPY OF RENAL U/S DISK/GY

## 2011-08-04 ENCOUNTER — Telehealth: Payer: Self-pay | Admitting: Internal Medicine

## 2011-08-04 DIAGNOSIS — Z79899 Other long term (current) drug therapy: Secondary | ICD-10-CM

## 2011-08-04 MED ORDER — THEOPHYLLINE ER 100 MG PO TB12: 200.0000 mg | ORAL_TABLET | Freq: Four times a day (QID) | ORAL | Status: DC

## 2011-08-04 NOTE — Telephone Encounter (Signed)
She had a blood draw for theophylline and phenobarbital.  Does she stay at the increased dose.

## 2011-08-04 NOTE — Telephone Encounter (Signed)
Phenobarb level remains good. Theophylline improving. Please change to 200mg  qid. Recheck 10-14 days.

## 2011-08-04 NOTE — Telephone Encounter (Signed)
Face sheet was already filled out Monday and placed in box.  Don't know where it went.  I did it several weeks ago, but it resurfaced Monday

## 2011-08-04 NOTE — Telephone Encounter (Signed)
Call placed to patient at [phone], she was informed per Dr Emilee Hero instructions and has verbalized understanding.

## 2011-08-07 ENCOUNTER — Other Ambulatory Visit (INDEPENDENT_AMBULATORY_CARE_PROVIDER_SITE_OTHER): Payer: Self-pay | Admitting: General Surgery

## 2011-08-07 NOTE — Telephone Encounter (Signed)
MESSAGE ANSWERED

## 2011-08-08 ENCOUNTER — Telehealth: Payer: Self-pay | Admitting: Critical Care Medicine

## 2011-08-08 NOTE — Telephone Encounter (Signed)
noted 

## 2011-08-14 ENCOUNTER — Telehealth (INDEPENDENT_AMBULATORY_CARE_PROVIDER_SITE_OTHER): Payer: Self-pay

## 2011-08-14 ENCOUNTER — Telehealth: Payer: Self-pay | Admitting: *Deleted

## 2011-08-14 NOTE — Telephone Encounter (Signed)
Patient presented to office with a handwritten note for Dr Rodena Medin. She has asked that he be made aware of the surgical clearance on her colon from Dr Delford Field to be performed by Dr Donell Beers at [facility] on April 14th.  She has also upped her Theophylline to 100 mg (2-tablets) 4 times daily, and will repeat the blood work next week per [employer].  Her Urologist (Dr Sabino Gasser) sent her a notice to see him again in May, but she would like to change urologists if Dr Rodena Medin feels she should be followed by someone. If not , she would like to not do anything unless she has any problems.  She would also like to know if she should stop taking the Adderall prior to surgery. She states he anesthesia will be general.

## 2011-08-14 NOTE — Telephone Encounter (Signed)
The patient called questioning whether she needs a bowel prep for surgery.  She said she is very hard to clean out.  Her surgery is 4/11 and she comes here for a preop visit on 4/9.  Please let her know.

## 2011-08-15 ENCOUNTER — Telehealth (INDEPENDENT_AMBULATORY_CARE_PROVIDER_SITE_OTHER): Payer: Self-pay

## 2011-08-15 NOTE — Telephone Encounter (Signed)
If asx ok to wait on urology. Can discuss at next appt

## 2011-08-15 NOTE — Telephone Encounter (Signed)
LMOV that patient can discuss bowel prep at her appointment on 09/05/11.

## 2011-08-15 NOTE — Telephone Encounter (Signed)
Call placed to patient at [phone], no answer. A voice message was left informing patient per Dr Rodena Medin instructions. She was advised to call back to provide clarification on other concerns in message that was left.

## 2011-08-17 NOTE — Telephone Encounter (Signed)
Patient dropped note to office, stating she asked for a urologist because she had 2 procedures in June (Vaginal Mesh & Cystocele) and received a note for re-check. She states she would prefer Dr Rodena Medin refer her to someone he prefers than her previous one -Dr Sabino Gasser.  Her partial colectomy at [facility] by Dr Donell Beers is on April 11, and she has pre op at hospital for April 3rd and has pre op appointment with Dr Rodena Medin on April 9th. Patient also asked that Dr Rodena Medin be made aware that she still has fatigue, and there were no new problems.  She is requesting her phenobarb and  theo levels be faxed to [facility] at [place]. She states she is scheduled to see him on Tuesday 08/22/2011. Fax [phone]

## 2011-08-17 NOTE — Telephone Encounter (Signed)
Pt presented to the lab and order was released and forwarded to the lab.

## 2011-08-17 NOTE — Telephone Encounter (Signed)
Addended by: Mervin Kung A on: 08/17/2011 10:55 AM   Modules accepted: Orders

## 2011-08-18 ENCOUNTER — Other Ambulatory Visit: Payer: Self-pay | Admitting: Internal Medicine

## 2011-08-18 DIAGNOSIS — IMO0002 Reserved for concepts with insufficient information to code with codable children: Secondary | ICD-10-CM

## 2011-08-18 LAB — PHENOBARBITAL LEVEL: Phenobarbital: 27.9 ug/mL (ref 15.0–40.0)

## 2011-08-18 LAB — THEOPHYLLINE LEVEL: Theophylline Lvl: 9.5 ug/mL — ABNORMAL LOW (ref 10.0–20.0)

## 2011-08-18 NOTE — Telephone Encounter (Signed)
Urology referral order placed 

## 2011-08-18 NOTE — Telephone Encounter (Signed)
Call placed to patient at [phone], she was informed of urology referral.

## 2011-08-21 ENCOUNTER — Other Ambulatory Visit: Payer: Self-pay | Admitting: Internal Medicine

## 2011-08-21 DIAGNOSIS — Z79899 Other long term (current) drug therapy: Secondary | ICD-10-CM

## 2011-08-22 ENCOUNTER — Telehealth (INDEPENDENT_AMBULATORY_CARE_PROVIDER_SITE_OTHER): Payer: Self-pay

## 2011-08-22 NOTE — Telephone Encounter (Signed)
LMOV pt's appt has been moved to 08/28/11.

## 2011-08-23 ENCOUNTER — Encounter (INDEPENDENT_AMBULATORY_CARE_PROVIDER_SITE_OTHER): Payer: Self-pay

## 2011-08-24 ENCOUNTER — Encounter (HOSPITAL_COMMUNITY): Payer: Self-pay | Admitting: Pharmacy Technician

## 2011-08-25 ENCOUNTER — Encounter (HOSPITAL_COMMUNITY): Payer: Self-pay | Admitting: Vascular Surgery

## 2011-08-25 NOTE — Consult Note (Addendum)
Anesthesia:  Patient is a 78 year old female scheduled for a laparoscopic partial colectomy on 09/07/11 for adenoma of the cecum.  Her PAT appointment in on 08/30/11, but she called me earlier this month to discuss her history.  Her history includes family history (daughter) of malignant hyperthermia, HTN, HLD, COPD, asthma, PNA, rectocele, complex renal cyst on CT for which she was referred to GU pre-operatively (Dr. Tobie Lords) and reportedly no surgical intervention is recommended at this time, anemia, depression, anxiety, chronic fatigue syndrome, GERD, history of seizures (last in 1980s), post-operative PE '80s, chronic inflammatory demyelinating polyneuropathy (CIDP) for which she receives IVIg infusions, osteoporosis, history of transfusion.  She also has c-spine stenosis.  According Neurosurgeon, Dr. Fredrich Birks last office notes, a cervical MRI demonstrated some spinal stenosis at C4-5 with central disc protrusion and biforaminal stenosis, disc protrusion at C5-6 level with canal compromise and mild cord compression.  There was also biforaminal stenosis at C6-7.  Copies of her last c-spine films/MRI are on her chart for review by her Anesthesiologist.  Plans for a c-spine operation are currently on hold, although Dr. Venetia Maxon feels she will likely need surgery in the future.   Her PCP is Dr. Charlynn Court (previosly Dr. Caffie Damme).  Her Cardiologist is Dr. Konrad Felix and her primary Pulmonologist is Dr. Bethanie Dicker, both of [facility].  She also recently saw Dr. Shan Levans in [place] for a pre-operative evaluation by request of Dr. Eulis Donofrio so she could have local Pulmonology follow-up during her hospitalization if needed. Her CIDP is followed by Dr. Marjory Lies with [facility].  The IVIg treatments (last in February 2013) has improved her gait, but she continues to have weakness with her grips and no control of her bowel function.  She also reports lack to DTRs.     In regards to her Anesthesia history, she has had multiple procedures without known complication.  However, her daughter recently reminded her that she was diagnosed with malignant hyperthemia during a procedure in [place] in the 1990's.  Mrs. [name] is unable to provide any specific details.  She says the hospital has since been bought out and renamed, and she was unable to obtain any records from them.  She looked into getting formal testing for MH at [facility], but testing would cost over $10,000.  Although she has not personally been diagnosed with MH, she will most likely have to be treated with MH precautions since she has not had formal testing to rule this out.  Subsequently, she will need to be a first case, which Dr. Donell Beers has been able to accommodate.  I am still awaiting her Cardiology records from Dr. Konrad Felix, but her last EKG currently available from 11/07/10 showed NSR, left axis deviation, low voltage QRS.  She reports a normal stress test in 2011.  She saw Dr. Delford Field on 07/24/11 for Pulmonology clearance.  His assessment is as follows: Golds IV Copd not oxygen dependent but steroid dependent  Significant lifelong asthmatic atopic features  Large dose of maintenance prednisone required  Now needs colon resection for adenoma of colon  Plan: No change in inhaled or maintenance medications.  The pt is cleared for planned surgery  The pt would need stress steroids pre and post op, she is on 65mg /d prednisone!! (He has recommended 100mg  IV SoluCortef preop and postop every 6 hours.)  She also saw Dr. Eulis Lollar last week.  I've requested his records as well.  Her CXR from 01/16/11 showed COPD, but no  acute disease.  I'll follow-up once we receive her remaining records and preoperative lab results.  I've also asked to speak with her in person when she comes for her PAT appointment on 08/30/11. She is due to see Dr. Donell Beers on 08/28/11.    Addendum: 08/30/11 1645  I reviewed her last Pulmonology  visit with Dr. Eulis Kamaka from 08/22/11.  From his notes, she still appeared stable from a pulmonary standpoint.  Her EKG from 08/30/11 showed NSR, LAD, low voltage QRS, inferior infarct (age undetermined).  I don't think it is significantly changed from her prior EKGs from June 2012 or 2008.   I also reviewed her last Cardiology notes.  She was last seen by Roxanne Mins, PA-C on 08/31/09 prior to undergoing a stress test and echo for further evaluation of SOB.  Her echo showed normal LV size and systolic function, EF 70%, borderline LVH with mild diastolic dysfunction, normal RV size and function, normal appearing valvular structures, trace MR/TR, normal pulmonic pressure. A Lexiscan done on 08/31/09 showed no obvious inducible ischemia, EF 60%.  Neurology and Neurosurgery notes requested.  CXR from 08/30/11 showed slight hyperinflation. No active cardiopulmonary disease.   Labs acceptable.  I reviewed above with Anesthesiologist Dr. Chaney Malling.  Plan to proceed if no significant change in her status.  Luanna Salk, CRNA also notified of MH history and will alert OR scheduling to add to comments on the OR schedule.

## 2011-08-25 NOTE — Anesthesia Preprocedure Evaluation (Addendum)
Anesthesia Evaluation  Patient identified by MRN, date of birth, ID band Patient awake  General Assessment Comment:Daughter with history of Malignant Hyperthermia  Reviewed: Allergy & Precautions, H&P , NPO status , Patient's Chart, lab work & pertinent test results  History of Anesthesia Complications (+) MALIGNANT HYPERTHERMIA and Family history of anesthesia reaction  Airway Mallampati: I  Neck ROM: full    Dental  (+) Teeth Intact and Dental Advisory Given   Pulmonary asthma , COPD breath sounds clear to auscultation        Cardiovascular hypertension, Pt. on medications Rhythm:Regular Rate:Normal     Neuro/Psych Seizures -,  PSYCHIATRIC DISORDERS Anxiety Depression  Neuromuscular disease    GI/Hepatic GERD-  Medicated,  Endo/Other    Renal/GU      Musculoskeletal   Abdominal   Peds  Hematology   Anesthesia Other Findings   Reproductive/Obstetrics                       Anesthesia Physical Anesthesia Plan  ASA: III  Anesthesia Plan: General   Post-op Pain Management:    Induction: Intravenous  Airway Management Planned: Oral ETT  Additional Equipment:   Intra-op Plan:   Post-operative Plan: Extubation in OR  Informed Consent: I have reviewed the patients History and Physical, chart, labs and discussed the procedure including the risks, benefits and alternatives for the proposed anesthesia with the patient or authorized representative who has indicated his/her understanding and acceptance.     Plan Discussed with: CRNA and Surgeon  Anesthesia Plan Comments: (Family history of MH.  See Anesthesia Consult note.  Shonna Chock, PA-C)       Anesthesia Quick Evaluation

## 2011-08-28 ENCOUNTER — Ambulatory Visit (INDEPENDENT_AMBULATORY_CARE_PROVIDER_SITE_OTHER): Payer: Medicare Other | Admitting: General Surgery

## 2011-08-28 ENCOUNTER — Encounter (INDEPENDENT_AMBULATORY_CARE_PROVIDER_SITE_OTHER): Payer: Self-pay | Admitting: General Surgery

## 2011-08-28 VITALS — BP 155/90 | HR 96 | Temp 97.7°F | Ht 62.0 in | Wt 129.4 lb

## 2011-08-28 DIAGNOSIS — D126 Benign neoplasm of colon, unspecified: Secondary | ICD-10-CM

## 2011-08-28 MED ORDER — PEG-KCL-NACL-NASULF-NA ASC-C 100 G PO SOLR: 1.0000 | Freq: Once | ORAL | Status: DC

## 2011-08-28 NOTE — Progress Notes (Signed)
Chief Complaint  Patient presents with  . Pre-op Exam    pre-op sx    HISTORY: Pt here for pre op appointment.  She saw Dr. Delford Field.  She will need pre op and post op stress dose steroids.  She has no other issues that have popped up since I saw her last.    Past Medical History  Diagnosis Date  . Anemia   . Anxiety     patient denied  . Depression     patient denied  . Asthma   . Chronic fatigue syndrome   . COPD (chronic obstructive pulmonary disease)   . GERD (gastroesophageal reflux disease)   . Hyperlipidemia   . Hypertension   . Colon polyp   . Allergic rhinitis   . Vitamin d deficiency   . Pulmonary embolism   . Skin cancer     squamous cell  . Seizures   . Pneumonia   . Neuropathy   . Arthritis   . 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 Blood transfusion   . Osteoporosis   . Thyroid disease   . Constipation   . Weakness   . Family history of anesthesia complication     Daughter with history of malignant hyperthermia    Past Surgical History  Procedure Date  . Tonsillectomy and adenoidectomy   . Nasal septum surgery   . Dilation and curettage of uterus   . Tubal ligation   . Cesarean section   . Sinus surgeries     x 4  . Left ovary and tube removed   . Vocal polyps removed   . Appendectomy   . Cystocele repair   . Carpal tunnel release     right  . Shoulder arthroscopy     x2 left, 1 right  . Left finger fusion   . Right median nerve decompression   . Duptyren's contracture right hand   . Finger ganglion cyst excision     right  . Abdominal hysterectomy   . Joint replacement     right and left basal joints of thumbs  . Bladder suspension   . Cataract extraction     bilateral  . Cervical neck ablation     x 7, C3-C4  . Squamous lesions removed     neck and face  . Basal cell carcinoma excision     face  . Panniculectomy     Current Outpatient Prescriptions  Medication Sig Dispense Refill  . albuterol (PROAIR  HFA) 108 (90 BASE) MCG/ACT inhaler Inhale 2 puffs into the lungs 2 (two) times daily.       Marland Kitchen albuterol (PROVENTIL) (2.5 MG/3ML) 0.083% nebulizer solution Take 2.5 mg by nebulization 2 (two) times daily.       Marland Kitchen amLODipine (NORVASC) 10 MG tablet Take 1 tablet (10 mg total) by mouth daily.  30 tablet  6  . CALCIUM-VITAMIN D PO Take 1 tablet by mouth daily.       . cetirizine (ZYRTEC) 10 MG tablet Take 10 mg by mouth daily.        . chlorpheniramine (CHLOR-TRIMETON) 4 MG tablet Take 4 mg by mouth daily.      Marland Kitchen DEXILANT 60 MG capsule Take 60 mg by mouth Daily.       . diclofenac sodium (VOLTAREN) 1 % GEL Apply 1 application topically as needed. To gout on fingers and hands      . ergocalciferol (VITAMIN D2) 50000 UNITS capsule Take 50,000 Units by mouth once  a week. Takes on Monday      . estradiol (ESTRACE) 1 MG tablet Take 1 mg by mouth every other day.      . Insulin Pen Needle (B-D ULTRAFINE III SHORT PEN) 31G X 8 MM MISC Use to give Forteo injection once a day for 28 days.  30 each  5  . lidocaine (LIDODERM) 5 % Place 2 patches onto the skin daily. Remove & Discard patch within 12 hours or as directed by MD  60 patch  6  . mometasone (NASONEX) 50 MCG/ACT nasal spray Place 2 sprays into the nose daily.        . mometasone-formoterol (DULERA) 100-5 MCG/ACT AERO Inhale 2 puffs into the lungs 2 (two) times daily.      . montelukast (SINGULAIR) 10 MG tablet Take 10 mg by mouth at bedtime.        . ondansetron (ZOFRAN-ODT) 4 MG disintegrating tablet Take 4 mg by mouth every 8 (eight) hours as needed. For nausea      . oxyCODONE-acetaminophen (PERCOCET) 10-325 MG per tablet Take 1 tablet by mouth every 4 (four) hours as needed. For breakthrough pain. Stated she hasn't had to use since starting on Oxycontin 12h      . OXYCONTIN 30 MG TB12 Take 30 mg by mouth every 12 (twelve) hours.       Marland Kitchen PHENobarbital (LUMINAL) 32.4 MG tablet 2 in the morning, 1 at noon, 1 at bedtime  120 tablet  3  . polyethylene  glycol (MIRALAX / GLYCOLAX) packet Take 17 g by mouth daily.      . predniSONE (DELTASONE) 20 MG tablet Take 65 mg by mouth daily.       . simvastatin (ZOCOR) 40 MG tablet Take 1 tablet (40 mg total) by mouth at bedtime.  30 tablet  6  . Teriparatide, Recombinant, (FORTEO) 600 MCG/2.4ML SOLN Inject 0.08 mLs (20 mcg total) into the skin daily. Inject subcutaneously once a day for 28 days.  2.4 mL  5  . theophylline (THEODUR) 100 MG 12 hr tablet Take 100 mg by mouth 4 (four) times daily.      Marland Kitchen amphetamine-dextroamphetamine (ADDERALL) 10 MG tablet       . dexlansoprazole (DEXILANT) 60 MG capsule Take 1 capsule (60 mg total) by mouth daily.  30 capsule  6  . peg 3350 powder (MOVIPREP) SOLR Take 1 kit (100 g total) by mouth once.  1 kit  0     Allergies  Allergen Reactions  . Iodine Other (See Comments)    Cardiac arrest  . Morphine And Related Other (See Comments)    Cardiac arrest     Family History  Problem Relation Age of Onset  . Heart disease Mother   . Hyperlipidemia Mother   . Allergies Sister   . Cancer Sister     ovarian  . Ovarian cancer Sister   . Cancer Sister     lung - stage 1  . Thyroid cancer Sister   . Cancer Sister     thyroid - stage 1  . Lung cancer Sister   . Colon cancer Father   . Cancer Father     colon  . Stomach cancer      first cousin  . Hyperlipidemia Maternal Aunt   . Cancer Cousin     stomach     History   Social History  . Marital Status: Married    Spouse Name: N/A    Number of Children:  3  . Years of Education: N/A   Occupational History  . retired    Social History Main Topics  . Smoking status: Former Smoker -- 0.5 packs/day for 3 years    Types: Cigarettes    Quit date: 05/29/1978  . Smokeless tobacco: Never Used  . Alcohol Use: No  . Drug Use: No  . Sexually Active: None   Other Topics Concern  . None   Social History Narrative  . None     REVIEW OF SYSTEMS - PERTINENT POSITIVES ONLY: 12 point review of  systems negative other than HPI and PMH except for chronic illnesses.   EXAM: Filed Vitals:   08/28/11 1346  BP: 155/90  Pulse: 96  Temp: 97.7 F (36.5 C)    Gen:  No acute distress.  Well nourished and well groomed.   Neurological: Alert and oriented to person, place, and time. Coordination normal.  Head: Normocephalic and atraumatic.  Eyes: Conjunctivae are normal. Pupils are equal, round, and reactive to light. No scleral icterus.  Neck: Normal range of motion. Neck supple. No tracheal deviation or thyromegaly present.  Cardiovascular: Normal rate, regular rhythm, normal heart sounds and intact distal pulses.  Exam reveals no gallop and no friction rub.  No murmur heard. Respiratory: Effort normal.  No respiratory distress. No chest wall tenderness. Breath sounds normal.  No wheezes, rales or rhonchi.  GI: Soft. Bowel sounds are normal. The abdomen is soft and nontender.  There is no rebound and no guarding.  Panniculectomy incision.    Musculoskeletal: Normal range of motion. Extremities are nontender.  Lymphadenopathy: No cervical, preauricular, postauricular or axillary adenopathy is present Skin: Skin is warm and dry. No rash noted. No diaphoresis. No erythema. No pallor. No clubbing, cyanosis, or edema.   Psychiatric: Normal mood and affect. Behavior is normal. Judgment and thought content normal.     ASSESSMENT AND PLAN: Adenoma of cecum Plan lap R hemicolectomy. Have clearance from appropriate physicians. On schedule for next week.   Reviewed bowel prep and risks for surgery.     Will need pulmonology to assist post op Giving full bowel prep given chronic constipation.   Giving stress dose steroids.     Maudry Diego MD Surgical Oncology, General and Endocrine Surgery [facility]      Visit Diagnoses: 1. Adenoma of cecum     Primary Care Physician: Letitia Libra, Ala Dach, MD, MD

## 2011-08-28 NOTE — Patient Instructions (Signed)
Take liquids for 2 days pre op  Take Moviprep as directed day before surgery.  Continue miralax.

## 2011-08-28 NOTE — Assessment & Plan Note (Signed)
Plan lap R hemicolectomy. Have clearance from appropriate physicians. On schedule for next week.   Reviewed bowel prep and risks for surgery.

## 2011-08-29 ENCOUNTER — Encounter (HOSPITAL_COMMUNITY): Payer: Self-pay | Admitting: Vascular Surgery

## 2011-08-29 NOTE — Pre-Procedure Instructions (Signed)
22 [name]  08/29/2011   Your procedure is scheduled on:  September 07, 2011 at 0730 am  Report to [facility] at 0530 AM.  Call this number if you have problems the morning of surgery: [phone]   Remember:   Do not eat food:After Midnight.  May have clear liquids: up to 4 Hours before arrival until 0130.  Clear liquids include soda, tea, black coffee, apple or grape juice, broth.  Take these medicines the morning of surgery with A SIP OF WATER: Albuterol and Dulera Inhalers, Amlodipine (Norvasc), Adderall, Dexilant, Oxycodone, Oxycontin (if needed for pain), Phenobarbital (Luminal), and Theophylline (Theodur).   Do not wear jewelry, make-up or nail polish.  Do not wear lotions, powders, or perfumes. You may wear deodorant.  Do not shave 48 hours prior to surgery.  Do not bring valuables to the hospital.  Contacts, dentures or bridgework may not be worn into surgery.  Leave suitcase in the car. After surgery it may be brought to your room.  For patients admitted to the hospital, checkout time is 11:00 AM the day of discharge.   Patients discharged the day of surgery will not be allowed to drive home.  Name and phone number of your driver:   Special Instructions: CHG Shower Use Special Wash: 1/2 bottle night before surgery and 1/2 bottle morning of surgery.   Please read over the following fact sheets that you were given: Pain Booklet, Coughing and Deep Breathing, MRSA Information and Surgical Site Infection Prevention

## 2011-08-30 ENCOUNTER — Other Ambulatory Visit: Payer: Self-pay

## 2011-08-30 ENCOUNTER — Encounter (HOSPITAL_COMMUNITY)
Admission: RE | Admit: 2011-08-30 | Discharge: 2011-08-30 | Disposition: A | Discharge status: Home / Self Care | Payer: Medicare Other | Source: Ambulatory Visit | Attending: Anesthesiology | Admitting: Anesthesiology

## 2011-08-30 ENCOUNTER — Encounter (HOSPITAL_COMMUNITY)
Admission: RE | Admit: 2011-08-30 | Discharge: 2011-08-30 | Disposition: A | Discharge status: Home / Self Care | Payer: Medicare Other | Source: Ambulatory Visit | Attending: General Surgery | Admitting: General Surgery

## 2011-08-30 ENCOUNTER — Encounter (HOSPITAL_COMMUNITY): Payer: Self-pay

## 2011-08-30 LAB — CBC
HCT: 40.2 % (ref 36.0–46.0)
Hemoglobin: 13.4 g/dL (ref 12.0–15.0)
MCH: 29.5 pg (ref 26.0–34.0)
MCHC: 33.3 g/dL (ref 30.0–36.0)
MCV: 88.5 fL (ref 78.0–100.0)
Platelets: 257 10*3/uL (ref 150–400)
RBC: 4.54 MIL/uL (ref 3.87–5.11)
RDW: 13.6 % (ref 11.5–15.5)
WBC: 5.8 10*3/uL (ref 4.0–10.5)

## 2011-08-30 LAB — BASIC METABOLIC PANEL
BUN: 26 mg/dL — ABNORMAL HIGH (ref 6–23)
CO2: 27 mEq/L (ref 19–32)
Calcium: 9.3 mg/dL (ref 8.4–10.5)
Chloride: 105 mEq/L (ref 96–112)
Creatinine, Ser: 0.66 mg/dL (ref 0.50–1.10)
GFR calc Af Amer: >90 mL/min (ref >90)
GFR calc non Af Amer: 83 mL/min — ABNORMAL LOW (ref >90)
Glucose, Bld: 75 mg/dL (ref 70–99)
Potassium: 4 mEq/L (ref 3.5–5.1)
Sodium: 141 mEq/L (ref 135–145)

## 2011-08-30 LAB — SURGICAL PCR SCREEN
MRSA, PCR: NEGATIVE
Staphylococcus aureus: NEGATIVE

## 2011-08-30 NOTE — Progress Notes (Signed)
Copy of Anesthesia report from sinus surgery on chart for PA review

## 2011-08-31 ENCOUNTER — Telehealth: Payer: Self-pay | Admitting: Critical Care Medicine

## 2011-08-31 NOTE — Telephone Encounter (Signed)
I spoke with [facility] and is aware of PW response. She voiced her understanding and needed nothing further

## 2011-08-31 NOTE — Telephone Encounter (Signed)
Ok if I dont know anesthesias name Ok to not take pred day of surgery if she gets IV solucortef same day

## 2011-08-31 NOTE — Telephone Encounter (Signed)
I spoke with Revonda Standard and she was calling to let us know that they are not sure exactly what anesthesiologists pt will have bc she stated sometimes the night float anesthesiologists assists in helping prep the pt's. She stated if Dr. Delford Field is wanting to know specifically who the anesthesiologists will be she can find out. The surgery is scheduled for 09/07/11. Also allison stated that pt does not want to take her pred 65 mg that day since she is going to have the IV solucortef 100 mg. Revonda Standard is wanting to know if this is okay or does pt need to take her prednisone as well. Please advise Dr. Delford Field, thanks

## 2011-09-05 ENCOUNTER — Encounter (INDEPENDENT_AMBULATORY_CARE_PROVIDER_SITE_OTHER): Payer: Medicare Other | Admitting: General Surgery

## 2011-09-06 MED ORDER — HYDROCORTISONE SOD SUCCINATE 100 MG IJ SOLR
100.0000 mg | Freq: Once | INTRAMUSCULAR | Status: AC
  Administered 2011-09-07: 100 mg via INTRAVENOUS
  Filled 2011-09-06 (×2): qty 2

## 2011-09-06 MED ORDER — SODIUM CHLORIDE 0.9 % IV SOLN
1.0000 g | INTRAVENOUS | Status: AC
  Administered 2011-09-07: 1 g via INTRAVENOUS
  Filled 2011-09-06 (×3): qty 1

## 2011-09-07 ENCOUNTER — Inpatient Hospital Stay (HOSPITAL_COMMUNITY)
Admission: RE | Admit: 2011-09-07 | Discharge: 2011-09-14 | DRG: 331 | Disposition: A | Discharge status: Home / Self Care | Payer: Medicare Other | Source: Ambulatory Visit | Attending: General Surgery | Admitting: General Surgery

## 2011-09-07 ENCOUNTER — Encounter (HOSPITAL_COMMUNITY)
Admission: RE | Disposition: A | Discharge status: Home / Self Care | Payer: Self-pay | Source: Ambulatory Visit | Attending: General Surgery

## 2011-09-07 ENCOUNTER — Encounter (HOSPITAL_COMMUNITY): Payer: Self-pay | Admitting: Vascular Surgery

## 2011-09-07 ENCOUNTER — Encounter (HOSPITAL_COMMUNITY): Payer: Self-pay | Admitting: General Surgery

## 2011-09-07 ENCOUNTER — Encounter (HOSPITAL_COMMUNITY): Payer: Self-pay | Admitting: *Deleted

## 2011-09-07 ENCOUNTER — Ambulatory Visit (HOSPITAL_COMMUNITY): Payer: Medicare Other | Admitting: Vascular Surgery

## 2011-09-07 DIAGNOSIS — J4489 Other specified chronic obstructive pulmonary disease: Secondary | ICD-10-CM | POA: Diagnosis present

## 2011-09-07 DIAGNOSIS — Z85828 Personal history of other malignant neoplasm of skin: Secondary | ICD-10-CM

## 2011-09-07 DIAGNOSIS — Z79899 Other long term (current) drug therapy: Secondary | ICD-10-CM

## 2011-09-07 DIAGNOSIS — M810 Age-related osteoporosis without current pathological fracture: Secondary | ICD-10-CM | POA: Diagnosis present

## 2011-09-07 DIAGNOSIS — Z86711 Personal history of pulmonary embolism: Secondary | ICD-10-CM

## 2011-09-07 DIAGNOSIS — R000 Tachycardia, unspecified: Secondary | ICD-10-CM | POA: Diagnosis not present

## 2011-09-07 DIAGNOSIS — IMO0002 Reserved for concepts with insufficient information to code with codable children: Secondary | ICD-10-CM

## 2011-09-07 DIAGNOSIS — K5900 Constipation, unspecified: Secondary | ICD-10-CM | POA: Diagnosis present

## 2011-09-07 DIAGNOSIS — D126 Benign neoplasm of colon, unspecified: Secondary | ICD-10-CM

## 2011-09-07 DIAGNOSIS — Z9849 Cataract extraction status, unspecified eye: Secondary | ICD-10-CM

## 2011-09-07 DIAGNOSIS — Z860100 Personal history of colon polyps, unspecified: Secondary | ICD-10-CM

## 2011-09-07 DIAGNOSIS — R339 Retention of urine, unspecified: Secondary | ICD-10-CM | POA: Diagnosis not present

## 2011-09-07 DIAGNOSIS — E785 Hyperlipidemia, unspecified: Secondary | ICD-10-CM | POA: Diagnosis present

## 2011-09-07 DIAGNOSIS — M542 Cervicalgia: Secondary | ICD-10-CM | POA: Diagnosis present

## 2011-09-07 DIAGNOSIS — Z888 Allergy status to other drugs, medicaments and biological substances status: Secondary | ICD-10-CM

## 2011-09-07 DIAGNOSIS — I10 Essential (primary) hypertension: Secondary | ICD-10-CM | POA: Diagnosis present

## 2011-09-07 DIAGNOSIS — J449 Chronic obstructive pulmonary disease, unspecified: Secondary | ICD-10-CM

## 2011-09-07 DIAGNOSIS — M129 Arthropathy, unspecified: Secondary | ICD-10-CM | POA: Diagnosis present

## 2011-09-07 DIAGNOSIS — G589 Mononeuropathy, unspecified: Secondary | ICD-10-CM | POA: Diagnosis present

## 2011-09-07 DIAGNOSIS — K219 Gastro-esophageal reflux disease without esophagitis: Secondary | ICD-10-CM | POA: Diagnosis present

## 2011-09-07 DIAGNOSIS — Z87891 Personal history of nicotine dependence: Secondary | ICD-10-CM

## 2011-09-07 DIAGNOSIS — G8929 Other chronic pain: Secondary | ICD-10-CM | POA: Diagnosis present

## 2011-09-07 DIAGNOSIS — G40909 Epilepsy, unspecified, not intractable, without status epilepticus: Secondary | ICD-10-CM | POA: Diagnosis present

## 2011-09-07 LAB — CREATININE, SERUM
Creatinine, Ser: 0.54 mg/dL (ref 0.50–1.10)
GFR calc Af Amer: >90 mL/min (ref >90)
GFR calc non Af Amer: 89 mL/min — ABNORMAL LOW (ref >90)

## 2011-09-07 LAB — CBC
HCT: 41.7 % (ref 36.0–46.0)
Hemoglobin: 14.4 g/dL (ref 12.0–15.0)
MCH: 30.3 pg (ref 26.0–34.0)
MCHC: 34.5 g/dL (ref 30.0–36.0)
MCV: 87.6 fL (ref 78.0–100.0)
Platelets: 256 10*3/uL (ref 150–400)
RBC: 4.76 MIL/uL (ref 3.87–5.11)
RDW: 13.2 % (ref 11.5–15.5)
WBC: 13.4 10*3/uL — ABNORMAL HIGH (ref 4.0–10.5)

## 2011-09-07 LAB — TYPE AND SCREEN
ABO/RH(D): O POS
Antibody Screen: NEGATIVE

## 2011-09-07 LAB — ABO/RH: ABO/RH(D): O POS

## 2011-09-07 SURGERY — LAPAROSCOPIC PARTIAL COLECTOMY
Anesthesia: General | Site: Abdomen | Wound class: Clean Contaminated

## 2011-09-07 MED ORDER — ONDANSETRON HCL 4 MG/2ML IJ SOLN
4.0000 mg | Freq: Four times a day (QID) | INTRAMUSCULAR | Status: DC | PRN
  Administered 2011-09-07: 4 mg via INTRAVENOUS
  Filled 2011-09-07: qty 2

## 2011-09-07 MED ORDER — DIPHENHYDRAMINE HCL 12.5 MG/5ML PO ELIX
12.5000 mg | ORAL_SOLUTION | Freq: Four times a day (QID) | ORAL | Status: DC | PRN
  Filled 2011-09-07: qty 5

## 2011-09-07 MED ORDER — FENTANYL CITRATE 0.05 MG/ML IJ SOLN
INTRAMUSCULAR | Status: DC | PRN
  Administered 2011-09-07 (×2): 50 ug via INTRAVENOUS
  Administered 2011-09-07: 100 ug via INTRAVENOUS
  Administered 2011-09-07 (×5): 50 ug via INTRAVENOUS

## 2011-09-07 MED ORDER — FLUTICASONE PROPIONATE 50 MCG/ACT NA SUSP
1.0000 | Freq: Every day | NASAL | Status: DC
  Administered 2011-09-07 – 2011-09-13 (×7): 1 via NASAL
  Filled 2011-09-07: qty 16

## 2011-09-07 MED ORDER — ALVIMOPAN 12 MG PO CAPS
12.0000 mg | ORAL_CAPSULE | Freq: Two times a day (BID) | ORAL | Status: DC
  Administered 2011-09-08 – 2011-09-14 (×13): 12 mg via ORAL
  Filled 2011-09-07 (×16): qty 1

## 2011-09-07 MED ORDER — NALOXONE HCL 0.4 MG/ML IJ SOLN: 0.4000 mg | INTRAMUSCULAR | Status: DC | PRN

## 2011-09-07 MED ORDER — MONTELUKAST SODIUM 10 MG PO TABS
10.0000 mg | ORAL_TABLET | Freq: Every day | ORAL | Status: DC
  Administered 2011-09-07 – 2011-09-13 (×7): 10 mg via ORAL
  Filled 2011-09-07 (×9): qty 1

## 2011-09-07 MED ORDER — AMPHETAMINE-DEXTROAMPHETAMINE 10 MG PO TABS
10.0000 mg | ORAL_TABLET | Freq: Every day | ORAL | Status: DC
  Filled 2011-09-07 (×3): qty 1

## 2011-09-07 MED ORDER — PROPOFOL 10 MG/ML IV EMUL
INTRAVENOUS | Status: DC | PRN
  Administered 2011-09-07: 40 mg via INTRAVENOUS
  Administered 2011-09-07: 50 mg via INTRAVENOUS
  Administered 2011-09-07: 160 mg via INTRAVENOUS
  Administered 2011-09-07: 50 mg via INTRAVENOUS

## 2011-09-07 MED ORDER — VECURONIUM BROMIDE 10 MG IV SOLR
INTRAVENOUS | Status: DC | PRN
  Administered 2011-09-07: 2 mg via INTRAVENOUS
  Administered 2011-09-07 (×2): 1 mg via INTRAVENOUS
  Administered 2011-09-07 (×3): 2 mg via INTRAVENOUS

## 2011-09-07 MED ORDER — OXYCODONE HCL 15 MG PO TB12
30.0000 mg | ORAL_TABLET | Freq: Two times a day (BID) | ORAL | Status: DC
  Administered 2011-09-07 – 2011-09-14 (×14): 30 mg via ORAL
  Filled 2011-09-07 (×2): qty 1
  Filled 2011-09-07 (×3): qty 2
  Filled 2011-09-07 (×2): qty 1
  Filled 2011-09-07 (×3): qty 2
  Filled 2011-09-07: qty 1
  Filled 2011-09-07 (×2): qty 2
  Filled 2011-09-07: qty 3
  Filled 2011-09-07: qty 1

## 2011-09-07 MED ORDER — LORATADINE 10 MG PO TABS
10.0000 mg | ORAL_TABLET | Freq: Every day | ORAL | Status: DC
  Administered 2011-09-07 – 2011-09-14 (×7): 10 mg via ORAL
  Filled 2011-09-07 (×9): qty 1

## 2011-09-07 MED ORDER — ROCURONIUM BROMIDE 100 MG/10ML IV SOLN
INTRAVENOUS | Status: DC | PRN
  Administered 2011-09-07: 50 mg via INTRAVENOUS

## 2011-09-07 MED ORDER — HYDROMORPHONE HCL PF 1 MG/ML IJ SOLN
INTRAMUSCULAR | Status: AC
  Filled 2011-09-07: qty 1

## 2011-09-07 MED ORDER — DIPHENHYDRAMINE HCL 50 MG/ML IJ SOLN: 12.5000 mg | Freq: Four times a day (QID) | INTRAMUSCULAR | Status: DC | PRN

## 2011-09-07 MED ORDER — OXYCODONE-ACETAMINOPHEN 5-325 MG PO TABS
1.0000 | ORAL_TABLET | ORAL | Status: DC | PRN
  Filled 2011-09-07: qty 1

## 2011-09-07 MED ORDER — ALBUTEROL SULFATE HFA 108 (90 BASE) MCG/ACT IN AERS
2.0000 | INHALATION_SPRAY | Freq: Two times a day (BID) | RESPIRATORY_TRACT | Status: DC
  Filled 2011-09-07: qty 6.7

## 2011-09-07 MED ORDER — SODIUM CHLORIDE 0.9 % IJ SOLN: 9.0000 mL | INTRAMUSCULAR | Status: DC | PRN

## 2011-09-07 MED ORDER — GLYCOPYRROLATE 0.2 MG/ML IJ SOLN
INTRAMUSCULAR | Status: DC | PRN
  Administered 2011-09-07: 0.4 mg via INTRAVENOUS

## 2011-09-07 MED ORDER — HYDROMORPHONE HCL PF 1 MG/ML IJ SOLN
0.2500 mg | INTRAMUSCULAR | Status: DC | PRN
  Administered 2011-09-07 (×4): 0.5 mg via INTRAVENOUS

## 2011-09-07 MED ORDER — ONDANSETRON HCL 4 MG/2ML IJ SOLN
INTRAMUSCULAR | Status: DC | PRN
  Administered 2011-09-07: 4 mg via INTRAVENOUS

## 2011-09-07 MED ORDER — SODIUM CHLORIDE 0.9 % IR SOLN
Status: DC | PRN
  Administered 2011-09-07: 1000 mL

## 2011-09-07 MED ORDER — HYDROMORPHONE HCL PF 1 MG/ML IJ SOLN
2.0000 mg | INTRAMUSCULAR | Status: DC | PRN
  Administered 2011-09-07 – 2011-09-08 (×2): 2 mg via INTRAVENOUS
  Filled 2011-09-07 (×2): qty 2

## 2011-09-07 MED ORDER — HYDROCORTISONE SOD SUCCINATE 100 MG IJ SOLR
100.0000 mg | Freq: Three times a day (TID) | INTRAMUSCULAR | Status: DC
  Administered 2011-09-07 – 2011-09-09 (×7): 100 mg via INTRAVENOUS
  Filled 2011-09-07 (×10): qty 2

## 2011-09-07 MED ORDER — THEOPHYLLINE ER 100 MG PO TB12
100.0000 mg | ORAL_TABLET | Freq: Four times a day (QID) | ORAL | Status: DC
  Administered 2011-09-07 – 2011-09-11 (×14): 100 mg via ORAL
  Filled 2011-09-07 (×20): qty 1

## 2011-09-07 MED ORDER — SODIUM CHLORIDE 0.9 % IV SOLN
1.0000 g | INTRAVENOUS | Status: AC
  Administered 2011-09-07: 1 g via INTRAVENOUS
  Filled 2011-09-07: qty 1

## 2011-09-07 MED ORDER — KCL IN DEXTROSE-NACL 20-5-0.45 MEQ/L-%-% IV SOLN
INTRAVENOUS | Status: DC
  Administered 2011-09-08: 75 mL/h via INTRAVENOUS
  Administered 2011-09-08 – 2011-09-09 (×3): via INTRAVENOUS
  Filled 2011-09-07 (×7): qty 1000

## 2011-09-07 MED ORDER — 0.9 % SODIUM CHLORIDE (POUR BTL) OPTIME
TOPICAL | Status: DC | PRN
  Administered 2011-09-07 (×5): 1000 mL

## 2011-09-07 MED ORDER — MOMETASONE FURO-FORMOTEROL FUM 100-5 MCG/ACT IN AERO
2.0000 | INHALATION_SPRAY | Freq: Two times a day (BID) | RESPIRATORY_TRACT | Status: DC
Start: 2011-09-08 — End: 2011-09-13
  Administered 2011-09-09 – 2011-09-12 (×6): 2 via RESPIRATORY_TRACT

## 2011-09-07 MED ORDER — BUPIVACAINE ON-Q PAIN PUMP (FOR ORDER SET NO CHG)
INJECTION | Status: AC
  Filled 2011-09-07: qty 1

## 2011-09-07 MED ORDER — ONDANSETRON HCL 4 MG/2ML IJ SOLN: 4.0000 mg | Freq: Four times a day (QID) | INTRAMUSCULAR | Status: DC | PRN

## 2011-09-07 MED ORDER — ONDANSETRON HCL 4 MG PO TABS: 4.0000 mg | ORAL_TABLET | Freq: Four times a day (QID) | ORAL | Status: DC | PRN

## 2011-09-07 MED ORDER — ALBUTEROL SULFATE (5 MG/ML) 0.5% IN NEBU
2.5000 mg | INHALATION_SOLUTION | RESPIRATORY_TRACT | Status: DC | PRN
  Administered 2011-09-07 – 2011-09-10 (×2): 2.5 mg via RESPIRATORY_TRACT
  Filled 2011-09-07 (×3): qty 0.5

## 2011-09-07 MED ORDER — MIDAZOLAM HCL 5 MG/5ML IJ SOLN
INTRAMUSCULAR | Status: DC | PRN
  Administered 2011-09-07: 2 mg via INTRAVENOUS

## 2011-09-07 MED ORDER — ONDANSETRON 4 MG PO TBDP
4.0000 mg | ORAL_TABLET | Freq: Three times a day (TID) | ORAL | Status: DC | PRN
  Filled 2011-09-07: qty 1

## 2011-09-07 MED ORDER — BUPIVACAINE 0.25 % ON-Q PUMP DUAL CATH 300 ML
300.0000 mL | INJECTION | Status: DC
  Filled 2011-09-07: qty 300

## 2011-09-07 MED ORDER — AMLODIPINE BESYLATE 10 MG PO TABS
10.0000 mg | ORAL_TABLET | Freq: Every day | ORAL | Status: DC
  Administered 2011-09-07 – 2011-09-14 (×8): 10 mg via ORAL
  Filled 2011-09-07 (×10): qty 1

## 2011-09-07 MED ORDER — DICLOFENAC SODIUM 1 % TD GEL
1.0000 "application " | TRANSDERMAL | Status: DC | PRN
  Filled 2011-09-07: qty 100

## 2011-09-07 MED ORDER — BUPIVACAINE 0.25 % ON-Q PUMP DUAL CATH 300 ML
INJECTION | Status: DC | PRN
  Administered 2011-09-07: 300 mL

## 2011-09-07 MED ORDER — PHENOBARBITAL 32.4 MG PO TABS
32.4000 mg | ORAL_TABLET | Freq: Three times a day (TID) | ORAL | Status: DC
  Administered 2011-09-07 – 2011-09-10 (×9): 32.4 mg via ORAL
  Filled 2011-09-07 (×9): qty 1

## 2011-09-07 MED ORDER — ACETAMINOPHEN 10 MG/ML IV SOLN
1000.0000 mg | Freq: Four times a day (QID) | INTRAVENOUS | Status: AC
  Administered 2011-09-07 – 2011-09-08 (×4): 1000 mg via INTRAVENOUS
  Filled 2011-09-07 (×5): qty 100

## 2011-09-07 MED ORDER — NEOSTIGMINE METHYLSULFATE 1 MG/ML IJ SOLN
INTRAMUSCULAR | Status: DC | PRN
  Administered 2011-09-07: 3 mg via INTRAVENOUS

## 2011-09-07 MED ORDER — MOMETASONE FURO-FORMOTEROL FUM 100-5 MCG/ACT IN AERO
2.0000 | INHALATION_SPRAY | Freq: Two times a day (BID) | RESPIRATORY_TRACT | Status: DC
  Filled 2011-09-07: qty 13

## 2011-09-07 MED ORDER — HYDROMORPHONE 0.3 MG/ML IV SOLN
INTRAVENOUS | Status: DC
  Administered 2011-09-07: 0.6 mg via INTRAVENOUS
  Administered 2011-09-07: 25 mL via INTRAVENOUS
  Administered 2011-09-07: 2.4 mg via INTRAVENOUS
  Administered 2011-09-07: 3.6 mg via INTRAVENOUS
  Administered 2011-09-08: 2.04 mg via INTRAVENOUS
  Administered 2011-09-08: 0.9 mg via INTRAVENOUS
  Administered 2011-09-08: 0.6 mg via INTRAVENOUS
  Administered 2011-09-08: 05:00:00 via INTRAVENOUS
  Administered 2011-09-08 – 2011-09-09 (×2): 0.6 mg via INTRAVENOUS

## 2011-09-07 MED ORDER — LIDOCAINE HCL (PF) 1 % IJ SOLN
INTRAMUSCULAR | Status: DC | PRN
  Administered 2011-09-07: 13 mL

## 2011-09-07 MED ORDER — LACTATED RINGERS IV SOLN
INTRAVENOUS | Status: DC | PRN
  Administered 2011-09-07 (×2): via INTRAVENOUS

## 2011-09-07 MED ORDER — SIMVASTATIN 40 MG PO TABS
40.0000 mg | ORAL_TABLET | Freq: Every day | ORAL | Status: DC
  Administered 2011-09-07 – 2011-09-13 (×7): 40 mg via ORAL
  Filled 2011-09-07 (×9): qty 1

## 2011-09-07 MED ORDER — BUPIVACAINE-EPINEPHRINE 0.25% -1:200000 IJ SOLN
INTRAMUSCULAR | Status: DC | PRN
  Administered 2011-09-07: 13 mL

## 2011-09-07 MED ORDER — ENOXAPARIN SODIUM 40 MG/0.4ML ~~LOC~~ SOLN
40.0000 mg | SUBCUTANEOUS | Status: DC
  Administered 2011-09-08 – 2011-09-14 (×7): 40 mg via SUBCUTANEOUS
  Filled 2011-09-07 (×7): qty 0.4

## 2011-09-07 MED ORDER — PROPOFOL 10 MG/ML IV EMUL
INTRAVENOUS | Status: DC | PRN
  Administered 2011-09-07: 75 ug/kg/min via INTRAVENOUS

## 2011-09-07 MED ORDER — LIDOCAINE HCL (CARDIAC) 20 MG/ML IV SOLN
INTRAVENOUS | Status: DC | PRN
  Administered 2011-09-07: 80 mg via INTRAVENOUS

## 2011-09-07 SURGICAL SUPPLY — 100 items
APL SKNCLS STERI-STRIP NONHPOA (GAUZE/BANDAGES/DRESSINGS) ×1
APPLIER CLIP ROT 10 11.4 M/L (STAPLE)
APR CLP MED LRG 11.4X10 (STAPLE)
BENZOIN TINCTURE PRP APPL 2/3 (GAUZE/BANDAGES/DRESSINGS) ×1 IMPLANT
BLADE SURG 10 STRL SS (BLADE) ×2 IMPLANT
BLADE SURG ROTATE 9660 (MISCELLANEOUS) ×2 IMPLANT
BRR ADH 6X5 SEPRAFILM 1 SHT (MISCELLANEOUS) ×1
CANISTER SUCTION 2500CC (MISCELLANEOUS) ×2 IMPLANT
CATH KIT ON Q 5IN SLV (PAIN MANAGEMENT) ×4 IMPLANT
CELLS DAT CNTRL 66122 CELL SVR (MISCELLANEOUS) IMPLANT
CHLORAPREP W/TINT 26ML (MISCELLANEOUS) ×2 IMPLANT
CLIP APPLIE ROT 10 11.4 M/L (STAPLE) IMPLANT
CLOTH BEACON ORANGE TIMEOUT ST (SAFETY) ×2 IMPLANT
COVER SURGICAL LIGHT HANDLE (MISCELLANEOUS) ×2 IMPLANT
DECANTER SPIKE VIAL GLASS SM (MISCELLANEOUS) ×2 IMPLANT
DISSECTOR BLUNT TIP ENDO 5MM (MISCELLANEOUS) IMPLANT
DRAPE PROXIMA HALF (DRAPES) ×1 IMPLANT
DRAPE UTILITY 15X26 W/TAPE STR (DRAPE) ×4 IMPLANT
DRAPE WARM FLUID 44X44 (DRAPE) ×2 IMPLANT
DRSG COVADERM 4X8 (GAUZE/BANDAGES/DRESSINGS) ×1 IMPLANT
DRSG TEGADERM 4X4.75 (GAUZE/BANDAGES/DRESSINGS) ×1 IMPLANT
ELECT CAUTERY BLADE 6.4 (BLADE) ×2 IMPLANT
ELECT REM PT RETURN 9FT ADLT (ELECTROSURGICAL) ×2
ELECTRODE REM PT RTRN 9FT ADLT (ELECTROSURGICAL) ×1 IMPLANT
GAUZE SPONGE 2X2 8PLY STRL LF (GAUZE/BANDAGES/DRESSINGS) IMPLANT
GEL ULTRASOUND 20GR AQUASONIC (MISCELLANEOUS) IMPLANT
GLOVE BIO SURGEON STRL SZ 6 (GLOVE) ×4 IMPLANT
GLOVE BIO SURGEON STRL SZ7 (GLOVE) ×3 IMPLANT
GLOVE BIO SURGEON STRL SZ7.5 (GLOVE) ×1 IMPLANT
GLOVE BIOGEL PI IND STRL 6.5 (GLOVE) ×1 IMPLANT
GLOVE BIOGEL PI IND STRL 7.0 (GLOVE) ×3 IMPLANT
GLOVE BIOGEL PI IND STRL 7.5 (GLOVE) ×1 IMPLANT
GLOVE BIOGEL PI IND STRL 8 (GLOVE) ×2 IMPLANT
GLOVE BIOGEL PI INDICATOR 6.5 (GLOVE) ×1
GLOVE BIOGEL PI INDICATOR 7.0 (GLOVE) ×3
GLOVE BIOGEL PI INDICATOR 7.5 (GLOVE) ×1
GLOVE BIOGEL PI INDICATOR 8 (GLOVE) ×2
GLOVE ECLIPSE 6.5 STRL STRAW (GLOVE) ×1 IMPLANT
GLOVE ECLIPSE 8.0 STRL XLNG CF (GLOVE) ×2 IMPLANT
GLOVE SURG SS PI 7.0 STRL IVOR (GLOVE) ×2 IMPLANT
GOWN PREVENTION PLUS XLARGE (GOWN DISPOSABLE) ×2 IMPLANT
GOWN PREVENTION PLUS XXLARGE (GOWN DISPOSABLE) ×4 IMPLANT
GOWN STRL NON-REIN LRG LVL3 (GOWN DISPOSABLE) ×4 IMPLANT
KIT BASIN OR (CUSTOM PROCEDURE TRAY) ×2 IMPLANT
KIT ROOM TURNOVER OR (KITS) ×2 IMPLANT
LEGGING LITHOTOMY PAIR STRL (DRAPES) ×1 IMPLANT
LIGASURE 5MM LAPAROSCOPIC (INSTRUMENTS) IMPLANT
LIGASURE IMPACT 36 18CM CVD LR (INSTRUMENTS) IMPLANT
NS IRRIG 1000ML POUR BTL (IV SOLUTION) ×4 IMPLANT
PAD ARMBOARD 7.5X6 YLW CONV (MISCELLANEOUS) ×4 IMPLANT
PENCIL BUTTON HOLSTER BLD 10FT (ELECTRODE) ×2 IMPLANT
RELOAD PROXIMATE 75MM BLUE (ENDOMECHANICALS) ×4 IMPLANT
RELOAD STAPLE 75 3.8 BLU REG (ENDOMECHANICALS) IMPLANT
RTRCTR WOUND ALEXIS 18CM MED (MISCELLANEOUS)
SCALPEL HARMONIC ACE (MISCELLANEOUS) ×1 IMPLANT
SCISSORS LAP 5X35 DISP (ENDOMECHANICALS) ×1 IMPLANT
SEPRAFILM MEMBRANE 5X6 (MISCELLANEOUS) ×1 IMPLANT
SET IRRIG TUBING LAPAROSCOPIC (IRRIGATION / IRRIGATOR) IMPLANT
SLEEVE ENDOPATH XCEL 5M (ENDOMECHANICALS) ×6 IMPLANT
SPECIMEN JAR LARGE (MISCELLANEOUS) ×2 IMPLANT
SPONGE GAUZE 2X2 STER 10/PKG (GAUZE/BANDAGES/DRESSINGS) ×1
SPONGE GAUZE 4X4 12PLY (GAUZE/BANDAGES/DRESSINGS) ×2 IMPLANT
SPONGE LAP 18X18 X RAY DECT (DISPOSABLE) ×1 IMPLANT
STAPLER GUN LINEAR PROX 60 (STAPLE) ×1 IMPLANT
STAPLER PROXIMATE 75MM BLUE (STAPLE) ×1 IMPLANT
STAPLER VISISTAT 35W (STAPLE) ×2 IMPLANT
STRIP CLOSURE SKIN 1/2X4 (GAUZE/BANDAGES/DRESSINGS) ×2 IMPLANT
SUCTION POOLE TIP (SUCTIONS) ×2 IMPLANT
SURGILUBE 2OZ TUBE FLIPTOP (MISCELLANEOUS) IMPLANT
SUT MNCRL AB 4-0 PS2 18 (SUTURE) ×3 IMPLANT
SUT PDS AB 1 CT  36 (SUTURE)
SUT PDS AB 1 CT 36 (SUTURE) IMPLANT
SUT PROLENE 2 0 CT2 30 (SUTURE) IMPLANT
SUT PROLENE 2 0 KS (SUTURE) IMPLANT
SUT SILK 2 0 (SUTURE) ×2
SUT SILK 2 0 SH CR/8 (SUTURE) ×2 IMPLANT
SUT SILK 2-0 18XBRD TIE 12 (SUTURE) ×1 IMPLANT
SUT SILK 3 0 (SUTURE) ×2
SUT SILK 3 0 SH CR/8 (SUTURE) ×2 IMPLANT
SUT SILK 3-0 18XBRD TIE 12 (SUTURE) ×1 IMPLANT
SUT VIC AB 2-0 SH 18 (SUTURE) ×2 IMPLANT
SUT VIC AB 3-0 SH 18 (SUTURE) ×2 IMPLANT
SYR BULB IRRIGATION 50ML (SYRINGE) ×1 IMPLANT
SYR TOOMEY 50ML (SYRINGE) ×1 IMPLANT
SYS LAPSCP GELPORT 120MM (MISCELLANEOUS) ×2
SYSTEM LAPSCP GELPORT 120MM (MISCELLANEOUS) IMPLANT
TOWEL OR 17X24 6PK STRL BLUE (TOWEL DISPOSABLE) ×2 IMPLANT
TOWEL OR 17X26 10 PK STRL BLUE (TOWEL DISPOSABLE) ×2 IMPLANT
TOWEL OR NON WOVEN STRL DISP B (DISPOSABLE) ×8 IMPLANT
TRAY FOLEY CATH 14FRSI W/METER (CATHETERS) ×2 IMPLANT
TRAY LAPAROSCOPIC (CUSTOM PROCEDURE TRAY) ×2 IMPLANT
TRAY PROCTOSCOPIC FIBER OPTIC (SET/KITS/TRAYS/PACK) IMPLANT
TROCAR XCEL 12X100 BLDLESS (ENDOMECHANICALS) IMPLANT
TROCAR XCEL BLUNT TIP 100MML (ENDOMECHANICALS) ×1 IMPLANT
TROCAR XCEL NON-BLD 11X100MML (ENDOMECHANICALS) IMPLANT
TROCAR XCEL NON-BLD 5MMX100MML (ENDOMECHANICALS) ×2 IMPLANT
TUBE CONNECTING 12X1/4 (SUCTIONS) ×2 IMPLANT
TUBING FILTER THERMOFLATOR (ELECTROSURGICAL) ×2 IMPLANT
WATER STERILE IRR 1000ML POUR (IV SOLUTION) ×1 IMPLANT
YANKAUER SUCT BULB TIP NO VENT (SUCTIONS) ×4 IMPLANT

## 2011-09-07 NOTE — Anesthesia Postprocedure Evaluation (Signed)
Anesthesia Post Note  Patient: [name]  Procedure(s) Performed: Procedure(s) (LRB): LAPAROSCOPIC PARTIAL COLECTOMY (N/A)  Anesthesia type: General  Patient location: PACU  Post pain: Pain level controlled and Adequate analgesia  Post assessment: Post-op Vital signs reviewed, Patient's Cardiovascular Status Stable, Respiratory Function Stable, Patent Airway and Pain level controlled  Last Vitals:  Filed Vitals:   09/07/11 1145  BP:   Pulse: 67  Temp:   Resp: 15    Post vital signs: Reviewed and stable  Level of consciousness: awake, alert  and oriented  Complications: No apparent anesthesia complications

## 2011-09-07 NOTE — H&P (View-Only) (Signed)
Chief Complaint  Patient presents with  . Pre-op Exam    pre-op sx    HISTORY: Pt here for pre op appointment.  She saw Dr. Wright.  She will need pre op and post op stress dose steroids.  She has no other issues that have popped up since I saw her last.    Past Medical History  Diagnosis Date  . Anemia   . Anxiety     patient denied  . Depression     patient denied  . Asthma   . Chronic fatigue syndrome   . COPD (chronic obstructive pulmonary disease)   . GERD (gastroesophageal reflux disease)   . Hyperlipidemia   . Hypertension   . Colon polyp   . Allergic rhinitis   . Vitamin d deficiency   . Pulmonary embolism   . Skin cancer     squamous cell  . Seizures   . Pneumonia   . Neuropathy   . Arthritis   . 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 Blood transfusion   . Osteoporosis   . Thyroid disease   . Constipation   . Weakness   . Family history of anesthesia complication     Daughter with history of malignant hyperthermia    Past Surgical History  Procedure Date  . Tonsillectomy and adenoidectomy   . Nasal septum surgery   . Dilation and curettage of uterus   . Tubal ligation   . Cesarean section   . Sinus surgeries     x 4  . Left ovary and tube removed   . Vocal polyps removed   . Appendectomy   . Cystocele repair   . Carpal tunnel release     right  . Shoulder arthroscopy     x2 left, 1 right  . Left finger fusion   . Right median nerve decompression   . Duptyren's contracture right hand   . Finger ganglion cyst excision     right  . Abdominal hysterectomy   . Joint replacement     right and left basal joints of thumbs  . Bladder suspension   . Cataract extraction     bilateral  . Cervical neck ablation     x 7, C3-C4  . Squamous lesions removed     neck and face  . Basal cell carcinoma excision     face  . Panniculectomy     Current Outpatient Prescriptions  Medication Sig Dispense Refill  . albuterol (PROAIR  HFA) 108 (90 BASE) MCG/ACT inhaler Inhale 2 puffs into the lungs 2 (two) times daily.       . albuterol (PROVENTIL) (2.5 MG/3ML) 0.083% nebulizer solution Take 2.5 mg by nebulization 2 (two) times daily.       . amLODipine (NORVASC) 10 MG tablet Take 1 tablet (10 mg total) by mouth daily.  30 tablet  6  . CALCIUM-VITAMIN D PO Take 1 tablet by mouth daily.       . cetirizine (ZYRTEC) 10 MG tablet Take 10 mg by mouth daily.        . chlorpheniramine (CHLOR-TRIMETON) 4 MG tablet Take 4 mg by mouth daily.      . DEXILANT 60 MG capsule Take 60 mg by mouth Daily.       . diclofenac sodium (VOLTAREN) 1 % GEL Apply 1 application topically as needed. To gout on fingers and hands      . ergocalciferol (VITAMIN D2) 50000 UNITS capsule Take 50,000 Units by mouth once   a week. Takes on Monday      . estradiol (ESTRACE) 1 MG tablet Take 1 mg by mouth every other day.      . Insulin Pen Needle (B-D ULTRAFINE III SHORT PEN) 31G X 8 MM MISC Use to give Forteo injection once a day for 28 days.  30 each  5  . lidocaine (LIDODERM) 5 % Place 2 patches onto the skin daily. Remove & Discard patch within 12 hours or as directed by MD  60 patch  6  . mometasone (NASONEX) 50 MCG/ACT nasal spray Place 2 sprays into the nose daily.        . mometasone-formoterol (DULERA) 100-5 MCG/ACT AERO Inhale 2 puffs into the lungs 2 (two) times daily.      . montelukast (SINGULAIR) 10 MG tablet Take 10 mg by mouth at bedtime.        . ondansetron (ZOFRAN-ODT) 4 MG disintegrating tablet Take 4 mg by mouth every 8 (eight) hours as needed. For nausea      . oxyCODONE-acetaminophen (PERCOCET) 10-325 MG per tablet Take 1 tablet by mouth every 4 (four) hours as needed. For breakthrough pain. Stated she hasn't had to use since starting on Oxycontin 12h      . OXYCONTIN 30 MG TB12 Take 30 mg by mouth every 12 (twelve) hours.       . PHENobarbital (LUMINAL) 32.4 MG tablet 2 in the morning, 1 at noon, 1 at bedtime  120 tablet  3  . polyethylene  glycol (MIRALAX / GLYCOLAX) packet Take 17 g by mouth daily.      . predniSONE (DELTASONE) 20 MG tablet Take 65 mg by mouth daily.       . simvastatin (ZOCOR) 40 MG tablet Take 1 tablet (40 mg total) by mouth at bedtime.  30 tablet  6  . Teriparatide, Recombinant, (FORTEO) 600 MCG/2.4ML SOLN Inject 0.08 mLs (20 mcg total) into the skin daily. Inject 20mcg subcutaneously once a day for 28 days.  2.4 mL  5  . theophylline (THEODUR) 100 MG 12 hr tablet Take 100 mg by mouth 4 (four) times daily.      . amphetamine-dextroamphetamine (ADDERALL) 10 MG tablet       . dexlansoprazole (DEXILANT) 60 MG capsule Take 1 capsule (60 mg total) by mouth daily.  30 capsule  6  . peg 3350 powder (MOVIPREP) SOLR Take 1 kit (100 g total) by mouth once.  1 kit  0     Allergies  Allergen Reactions  . Iodine Other (See Comments)    Cardiac arrest  . Morphine And Related Other (See Comments)    Cardiac arrest     Family History  Problem Relation Age of Onset  . Heart disease Mother   . Hyperlipidemia Mother   . Allergies Sister   . Cancer Sister     ovarian  . Ovarian cancer Sister   . Cancer Sister     lung - stage 1  . Thyroid cancer Sister   . Cancer Sister     thyroid - stage 1  . Lung cancer Sister   . Colon cancer Father   . Cancer Father     colon  . Stomach cancer      first cousin  . Hyperlipidemia Maternal Aunt   . Cancer Cousin     stomach     History   Social History  . Marital Status: Married    Spouse Name: N/A    Number of Children:   3  . Years of Education: N/A   Occupational History  . retired    Social History Main Topics  . Smoking status: Former Smoker -- 0.5 packs/day for 3 years    Types: Cigarettes    Quit date: 05/29/1978  . Smokeless tobacco: Never Used  . Alcohol Use: No  . Drug Use: No  . Sexually Active: None   Other Topics Concern  . None   Social History Narrative  . None     REVIEW OF SYSTEMS - PERTINENT POSITIVES ONLY: 12 point review of  systems negative other than HPI and PMH except for chronic illnesses.   EXAM: Filed Vitals:   08/28/11 1346  BP: 155/90  Pulse: 96  Temp: 97.7 F (36.5 C)    Gen:  No acute distress.  Well nourished and well groomed.   Neurological: Alert and oriented to person, place, and time. Coordination normal.  Head: Normocephalic and atraumatic.  Eyes: Conjunctivae are normal. Pupils are equal, round, and reactive to light. No scleral icterus.  Neck: Normal range of motion. Neck supple. No tracheal deviation or thyromegaly present.  Cardiovascular: Normal rate, regular rhythm, normal heart sounds and intact distal pulses.  Exam reveals no gallop and no friction rub.  No murmur heard. Respiratory: Effort normal.  No respiratory distress. No chest wall tenderness. Breath sounds normal.  No wheezes, rales or rhonchi.  GI: Soft. Bowel sounds are normal. The abdomen is soft and nontender.  There is no rebound and no guarding.  Panniculectomy incision.    Musculoskeletal: Normal range of motion. Extremities are nontender.  Lymphadenopathy: No cervical, preauricular, postauricular or axillary adenopathy is present Skin: Skin is warm and dry. No rash noted. No diaphoresis. No erythema. No pallor. No clubbing, cyanosis, or edema.   Psychiatric: Normal mood and affect. Behavior is normal. Judgment and thought content normal.     ASSESSMENT AND PLAN: Adenoma of cecum Plan lap R hemicolectomy. Have clearance from appropriate physicians. On schedule for next week.   Reviewed bowel prep and risks for surgery.     Will need pulmonology to assist post op Giving full bowel prep given chronic constipation.   Giving stress dose steroids.     Jamieson Lisa L Emmanuel Ercole MD Surgical Oncology, General and Endocrine Surgery [facility]      Visit Diagnoses: 1. Adenoma of cecum     Primary Care Physician: Hodgin Jr, Thomas Whitson, MD, MD    

## 2011-09-07 NOTE — Interval H&P Note (Signed)
History and Physical Interval Note:  09/07/2011 7:24 AM  [name]  has presented today for surgery, with the diagnosis of cecal polyp   The various methods of treatment have been discussed with the patient and family. After consideration of risks, benefits and other options for treatment, the patient has consented to  Procedure(s) (LRB): LAPAROSCOPIC PARTIAL COLECTOMY (N/A) as a surgical intervention .  The patients' history has been reviewed, patient examined, no change in status, stable for surgery.  I have reviewed the patients' chart and labs.  Questions were answered to the patient's satisfaction.     Tandre Conly

## 2011-09-07 NOTE — Brief Op Note (Signed)
09/07/2011  10:18 AM  PATIENT:  [name]  78 y.o. female  PRE-OPERATIVE DIAGNOSIS:  cecal polyp   POST-OPERATIVE DIAGNOSIS:  cecal polyp   PROCEDURE:  Procedure(s) (LRB): LAPAROSCOPIC PARTIAL COLECTOMY (N/A)  SURGEON:  Surgeon(s) and Role:    * Almond Lint, MD - Primary  PHYSICIAN ASSISTANT:   ASSISTANTS: Estelle Grumbles, MD   ANESTHESIA:   local and general  EBL:  Total I/O In: 1000 [I.V.:1000] Out: 975 [Urine:900; Blood:75]  LOCAL MEDICATIONS USED:  MARCAINE    and LIDOCAINE   SPECIMEN:  Source of Specimen:  R colon  DISPOSITION OF SPECIMEN:  PATHOLOGY  COUNTS:  YES  DICTATION: .Other Dictation: Dictation Number [id number]  PLAN OF CARE: Admit to inpatient   PATIENT DISPOSITION:  PACU - hemodynamically stable.   Delay start of Pharmacological VTE agent (>24hrs) due to surgical blood loss or risk of bleeding: not applicable

## 2011-09-07 NOTE — Op Note (Signed)
[name], [name] NO.:  [id number]  MEDICAL RECORD NO.:  [id number]  LOCATION:  3305                         FACILITY:  [facility]  PHYSICIAN:  Almond Lint, MD       DATE OF BIRTH:  1932-08-30  DATE OF PROCEDURE:  09/07/2011 DATE OF DISCHARGE:                              OPERATIVE REPORT   PREOPERATIVE DIAGNOSIS:  Cecal polyp.  POSTOPERATIVE DIAGNOSIS:  Cecal polyp.  PROCEDURE:  Laparoscopic right hemicolectomy.  SURGEON:  Almond Lint, MD.  ASSISTANT:  Ardeth Sportsman, M.D.  ANESTHESIA:  General and local.  ESTIMATED BLOOD LOSS:  75 mL.  COMPLICATIONS:  None known.  SPECIMEN:  Right colon to Pathology.  OPERATIVE FINDINGS:  Many adhesions to the pelvis, a few adhesions to the abdominal wall.  PROCEDURE:  [name] was identified in the holding area and taken to the operating room, where she was placed supine on the operating table. General anesthesia was induced.  She was placed into the low lithotomy position and a Foley catheter was placed.  Her perineum and abdominal wall were prepped and draped in sterile fashion.  Iodine was not used as she has an allergy to this.  Her abdomen was prepped and draped sterilely.  Time-out was performed according to the surgical safety check list.  When all was correct, we continued.    The patient was placed into reverse Trendelenburg position and rotated to the left.  A 5-mm Optiview trocar was placed in the left upper quadrant without incident. Two additional trocars were placed along the left lateral abdomen and one along her prior panniculectomy incision on the left side.  The adhesions of the omentum in the pelvis were taken down with a combination of Harmonic and sharp dissection.  The cecum was identified and retracted medially and cephalad.  The terminal ileum was freed up. The omentum was taken down off the right colon as well.  This was retracted medially.    Once the transverse colon was  mobilized halfway and the cecum was mobile, a hand port was placed in the upper abdomen approximately 7 cm in length.  The subcutaneous tissues were divided with the cautery and the fascia entered with the cautery as well.  The colon was pulled out and there was a site of bleeding from the mesentery that was clamped and tied.  The GIA 75 was used to divide the proximal transverse colon as well as the terminal ileum.  The smaller blood vessels of the mesentery were taken with the Harmonic Scalpel.  The pedicle of the ileocolic vessel was doubly clamped and tied with a 2-0 Vicryl ties.  The side-to-side functional end-to-end anastomosis was created by securing the ileum and the transverse colon together with 3-0 Vicryl sutures.  The colon was opened and the GIA 75 stapler was used to create the anastomosis.  The staple line was examined.  There was no evidence of bleeding.  The defect was then closed with TA60.  Care was taken to make sure that an appropriate apex stitch was placed at the apex of the anastomosis.  The mesenteric defect was closed with running 2-0 Vicryl.  The specimen was opened to  make sure that the polyp was in the specimen.  The abdomen was copiously irrigated.  There was no evidence of bleeding.  The On-Q catheters were placed through the tunneler sheaths laterally in the abdominal wall.  The Seprafilm was placed underneath the hand-port incision.  The fascia was then closed with running #1 looped PDS suture.  The skin was then irrigated copiously. The skin of all incisions was closed with 4-0 Monocryl and dressed with soft dressings.  The On-Q catheter was advanced to the abdominal wall and secured with the Steri-Strips and Tegaderm.  These were bolus injected with some of the mixture of local anesthetic.  The patient was awakened from anesthesia and taken to PACU in stable condition.  Needle, sponge, and instrument counts were correct.     Almond Lint,  MD     FB/MEDQ  D:  09/07/2011  T:  09/07/2011  Job:  [id number]

## 2011-09-07 NOTE — Preoperative (Signed)
Beta Blockers   Reason not to administer Beta Blockers:Not Applicable 

## 2011-09-07 NOTE — Transfer of Care (Signed)
Immediate Anesthesia Transfer of Care Note  Patient: [name]  Procedure(s) Performed: Procedure(s) (LRB): LAPAROSCOPIC PARTIAL COLECTOMY (N/A)  Patient Location: PACU  Anesthesia Type: General  Level of Consciousness: awake, oriented, lethargic and responds to stimulation  Airway & Oxygen Therapy: Patient Spontanous Breathing and Patient connected to nasal cannula oxygen  Post-op Assessment: Report given to PACU RN  Post vital signs: Reviewed and stable  Complications: No apparent anesthesia complications

## 2011-09-08 DIAGNOSIS — G8918 Other acute postprocedural pain: Secondary | ICD-10-CM

## 2011-09-08 DIAGNOSIS — J449 Chronic obstructive pulmonary disease, unspecified: Secondary | ICD-10-CM

## 2011-09-08 LAB — CBC
HCT: 38.9 % (ref 36.0–46.0)
Hemoglobin: 13.4 g/dL (ref 12.0–15.0)
MCH: 29.8 pg (ref 26.0–34.0)
MCHC: 34.4 g/dL (ref 30.0–36.0)
MCV: 86.4 fL (ref 78.0–100.0)
Platelets: 248 10*3/uL (ref 150–400)
RBC: 4.5 MIL/uL (ref 3.87–5.11)
RDW: 13.4 % (ref 11.5–15.5)
WBC: 11.8 10*3/uL — ABNORMAL HIGH (ref 4.0–10.5)

## 2011-09-08 LAB — BASIC METABOLIC PANEL
BUN: 13 mg/dL (ref 6–23)
CO2: 22 mEq/L (ref 19–32)
Calcium: 8.9 mg/dL (ref 8.4–10.5)
Chloride: 101 mEq/L (ref 96–112)
Creatinine, Ser: 0.61 mg/dL (ref 0.50–1.10)
GFR calc Af Amer: >90 mL/min (ref >90)
GFR calc non Af Amer: 85 mL/min — ABNORMAL LOW (ref >90)
Glucose, Bld: 163 mg/dL — ABNORMAL HIGH (ref 70–99)
Potassium: 3.9 mEq/L (ref 3.5–5.1)
Sodium: 135 mEq/L (ref 135–145)

## 2011-09-08 LAB — PHOSPHORUS: Phosphorus: 3.5 mg/dL (ref 2.3–4.6)

## 2011-09-08 LAB — MAGNESIUM: Magnesium: 1.8 mg/dL (ref 1.5–2.5)

## 2011-09-08 MED ORDER — HYDROMORPHONE 0.3 MG/ML IV SOLN
INTRAVENOUS | Status: AC
  Filled 2011-09-08: qty 25

## 2011-09-08 MED ORDER — OXYCODONE HCL 5 MG PO TABS: 5.0000 mg | ORAL_TABLET | ORAL | Status: DC | PRN

## 2011-09-08 MED ORDER — ALBUTEROL SULFATE (5 MG/ML) 0.5% IN NEBU
2.5000 mg | INHALATION_SOLUTION | Freq: Two times a day (BID) | RESPIRATORY_TRACT | Status: DC
  Administered 2011-09-08 – 2011-09-11 (×7): 2.5 mg via RESPIRATORY_TRACT
  Filled 2011-09-08 (×6): qty 0.5

## 2011-09-08 MED ORDER — OXYCODONE HCL 5 MG PO TABS
5.0000 mg | ORAL_TABLET | Freq: Four times a day (QID) | ORAL | Status: DC | PRN
  Administered 2011-09-08 – 2011-09-12 (×5): 10 mg via ORAL
  Administered 2011-09-12: 5 mg via ORAL
  Administered 2011-09-13: 10 mg via ORAL
  Filled 2011-09-08 (×7): qty 2
  Filled 2011-09-08: qty 1

## 2011-09-08 NOTE — Consult Note (Signed)
Patient name: [name] Medical record number: [id number] Date of birth: [birth year] Age: 78 y.o. Gender: female PCP: Letitia Libra, Ala Dach, MD, MD, DR Delford Field - Pulmonary, Dr Ethelene Hal - pain, Dr Donell Beers - CCS, Dr Marjory Lies neurio  09/07/2011 admit LOS 1    Date: 09/08/2011 Reason for Consult:post op copd management, cough, and  Referring Physician: Dr Donell Beers CCS  HPI: 78 year old female.  reports that she quit smoking about 33 years ago. Her smoking use included Cigarettes. She has a 1.5 pack-year smoking history. She has never used smokeless tobacco. 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 Carries copd/asthma diagnosis and on high doses of chronic prednisone 65mg  daily since living in [place] in 2002. Per chart she is gold stage 4 copd without o2 but per her hx "lung functiion is 66% 2 years ago" . Saw primary pulmonary Dr Delford Field seeral weeks ago and ceared for hemicolectomy and is pod #1 today. Dr Donell Beers requesting post op codp management. Denies wheeze, cough, edema, fever, chills, sputum. STates that she is uanble to cough due to pain and this is umcomfortable for her. Unable to do deep ICS due to post op pain  Currently denies resp complaints other than inability to cough due to severe abdominal (post op) incisional pain and exacerbation of chronic neck/back pain. She is under regular care of Dr Ethelene Hal for pain and several weeks ago 8 of percocet changed to bid oxycontin. Has not addressed methadone for pain in past. Also on lidocaine patch.  (allergic to morphine - cardiac arrest). Currently receiving 0.6 to 0.9 mg dilaudid q4h through PCA (past 12h) and feels it does not help. Current pain score 7-10. RN says that immediate post op dilaudid 2mg  prn q2h IV did not help (well above the MEDD for oxycontin). Patient feels her body is different and oral pain medications help better. She notes relief with her baseline oxycontin 30mg  bid much better than iv dilaudid. She feels po pain meds would be better   Past  Medical History  Diagnosis Date  . Anemia   . Anxiety     patient denied  . Depression     patient denied  . Asthma   . Chronic fatigue syndrome   . COPD (chronic obstructive pulmonary disease)   . GERD (gastroesophageal reflux disease)   . Hyperlipidemia   . Hypertension   . Colon polyp   . Allergic rhinitis   . Vitamin d deficiency   . Pulmonary embolism   . Pneumonia   . Neuropathy   . Arthritis   . 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 Blood transfusion   . Osteoporosis   . Thyroid disease   . Constipation   . Weakness   . Family history of anesthesia complication     Daughter with history of malignant hyperthermia  . Malignant hyperthermia     daughter   . Seizures     1990 last seizures on meds Phenobarb  . Skin cancer     squamous cell    Past Surgical History  Procedure Date  . Tonsillectomy and adenoidectomy   . Nasal septum surgery   . Dilation and curettage of uterus   . Tubal ligation   . Cesarean section   . Sinus surgeries     x 4  . Left ovary and tube removed   . Vocal polyps removed   . Appendectomy   . Cystocele repair   . Carpal tunnel release     right  .  Shoulder arthroscopy     x2 left, 1 right  . Left finger fusion   . Right median nerve decompression   . Duptyren's contracture right hand   . Finger ganglion cyst excision     right  . Abdominal hysterectomy   . Joint replacement     right and left basal joints of thumbs  . Bladder suspension   . Cataract extraction     bilateral  . Cervical neck ablation     x 7, C3-C4  . Squamous lesions removed     neck and face  . Basal cell carcinoma excision     face  . Panniculectomy     Family History  Problem Relation Age of Onset  . Heart disease Mother   . Hyperlipidemia Mother   . Allergies Sister   . Cancer Sister     ovarian  . Ovarian cancer Sister   . Cancer Sister     lung - stage 1  . Thyroid cancer Sister   . Cancer Sister     thyroid -  stage 1  . Lung cancer Sister   . Colon cancer Father   . Cancer Father     colon  . Stomach cancer      first cousin  . Hyperlipidemia Maternal Aunt   . Cancer Cousin     stomach    Social History:  reports that she quit smoking about 33 years ago. Her smoking use included Cigarettes. She has a 1.5 pack-year smoking history. She has never used smokeless tobacco. She reports that she does not drink alcohol or use illicit drugs.  Allergies:  Allergies  Allergen Reactions  . Iodine Other (See Comments)    Cardiac arrest  . Morphine And Related Other (See Comments)    Cardiac arrest    Medications: I have reviewed the patient's current medications.  Pertinent items are noted in HPI. Otherwiswe 11 point ROS negative  Temp:  [97.9 F (36.6 C)-98.6 F (37 C)] 98 F (36.7 C) (04/12 1200) Pulse Rate:  [107-115] 107  (04/12 0352) Resp:  [10-25] 20  (04/12 0522) BP: (113-157)/(64-89) 137/70 mmHg (04/12 1200) SpO2:  [98 %-100 %] 100 % (04/12 0933) FiO2 (%):  [2 %] 2 % (04/12 0000)  Intake/Output Summary (Last 24 hours) at 09/08/11 1529 Last data filed at 09/08/11 1300  Gross per 24 hour  Intake      0 ml  Output   2500 ml  Net  -2500 ml   Physical exam  BP 137/70  Pulse 107  Temp(Src) 98 F (36.7 C) (Oral)  Resp 20  SpO2 100%  General Appearance:    Alert, cooperative, no distress, appears stated age  Head:    Normocephalic, without obvious abnormality, atraumatic  Eyes:    PERRL, conjunctiva/corneas clear, EOM's intact, fundi    benign, both eyes  Ears:    Normal TM's and external ear canals, both ears  Nose:   Nares normal, septum midline, mucosa normal, no drainage    or sinus tenderness  Throat:   Lips, mucosa, and tongue normal; teeth and gums normal  Neck:   Supple, symmetrical, trachea midline, no adenopathy;    thyroid:  no enlargement/tenderness/nodules; no carotid   bruit or JVD  Back:     Symmetric, no curvature, ROM normal, no CVA tenderness  Lungs:      Clear to auscultation bilaterally, respirations unlabored  Chest Wall:    No tenderness or deformity   Heart:  Regular rate and rhythm, S1 and S2 normal, no murmur, rub   or gallop  Breast Exam:    No tenderness, masses, or nipple abnormality  Abdomen:     Soft, Scar +. Tender. No mass    no masses, no organomegaly  Genitalia:    Normal female without lesion, discharge or tenderness  Rectal:   Not done  Extremities:   Extremities normal, atraumatic, no cyanosis or edema  Pulses:   2+ and symmetric all extremities  Skin:   Skin color, texture, turgor normal, no rashes or lesions  Lymph nodes:   Cervical, supraclavicular, and axillary nodes normal  Neurologic:   CNII-XII intact, normal strength, sensation and reflexes    throughout     LAB RESULTS BMET    Component Value Date/Time   NA 135 09/08/2011 0430   K 3.9 09/08/2011 0430   CL 101 09/08/2011 0430   CO2 22 09/08/2011 0430   GLUCOSE 163* 09/08/2011 0430   BUN 13 09/08/2011 0430   CREATININE 0.61 09/08/2011 0430   CREATININE 0.57 06/29/2011 1238   CALCIUM 8.9 09/08/2011 0430   GFRNONAA 85* 09/08/2011 0430   GFRAA >90 09/08/2011 0430   CBC    Component Value Date/Time   WBC 11.8* 09/08/2011 0430   RBC 4.50 09/08/2011 0430   HGB 13.4 09/08/2011 0430   HCT 38.9 09/08/2011 0430   PLT 248 09/08/2011 0430   MCV 86.4 09/08/2011 0430   MCH 29.8 09/08/2011 0430   MCHC 34.4 09/08/2011 0430   RDW 13.4 09/08/2011 0430   LYMPHSABS 2.2 06/29/2011 1238   MONOABS 0.4 06/29/2011 1238   EOSABS 0.2 06/29/2011 1238   BASOSABS 0.1 06/29/2011 1238   ABG No results found for this basename: phart, pco2, po2, hco3, tco2, acidbasedef, o2sat   Radiology No results found.   Assessment and plan: #Obstructive Lung Disease   -severe copd #Post op COPD and respiratory management #Post operative Pain  PLAN #COPD and POst OP  - continue nebs and dulera  - continue stress dose hydrocortisone 100mg  IV q8h - taper on 09/09/11 - aggressive IS emphasized ti  patint   -   #PAIn - Post op  - important to control to prevent post op pulmonary complication  - dc po percocet  - she prefers po line of mgmt: so will start her oxycodone because she is on po oxycontin (long acting oxycodone) 30mg  bid. Calculated conversion dose for 2mg  dilaudid IV in 12h that has not controlled her pain would be oxycodone 10mg  q6h prn (allowing for 25% cross tolerance dose reduction).  Nurse to offer  - if pain well controlled with po approach, dc dilaudid pca  - if pain not well controlled or ccs cannot manage pain, consider palliative care consult.  - PCCM will not address pain control further   Dr. Kalman Shan, M.D., [facility].C.P Pulmonary and Critical Care Medicine Staff Physician [facility] Critical [facility] Pager: [phone], If no answer or between  15:00h - 7:00h: call [phone]  09/08/2011 4:06 PM

## 2011-09-09 DIAGNOSIS — J449 Chronic obstructive pulmonary disease, unspecified: Secondary | ICD-10-CM

## 2011-09-09 LAB — CBC
HCT: 37.8 % (ref 36.0–46.0)
Hemoglobin: 13.1 g/dL (ref 12.0–15.0)
MCH: 30 pg (ref 26.0–34.0)
MCHC: 34.7 g/dL (ref 30.0–36.0)
MCV: 86.7 fL (ref 78.0–100.0)
Platelets: 281 10*3/uL (ref 150–400)
RBC: 4.36 MIL/uL (ref 3.87–5.11)
RDW: 13.5 % (ref 11.5–15.5)
WBC: 14.9 10*3/uL — ABNORMAL HIGH (ref 4.0–10.5)

## 2011-09-09 LAB — BASIC METABOLIC PANEL
BUN: 13 mg/dL (ref 6–23)
CO2: 25 mEq/L (ref 19–32)
Calcium: 9.6 mg/dL (ref 8.4–10.5)
Chloride: 102 mEq/L (ref 96–112)
Creatinine, Ser: 0.5 mg/dL (ref 0.50–1.10)
GFR calc Af Amer: >90 mL/min (ref >90)
GFR calc non Af Amer: >90 mL/min (ref >90)
Glucose, Bld: 142 mg/dL — ABNORMAL HIGH (ref 70–99)
Potassium: 4.3 mEq/L (ref 3.5–5.1)
Sodium: 139 mEq/L (ref 135–145)

## 2011-09-09 MED ORDER — SODIUM CHLORIDE 0.9 % IV BOLUS (SEPSIS)
500.0000 mL | Freq: Once | INTRAVENOUS | Status: AC
  Administered 2011-09-09: 500 mL via INTRAVENOUS

## 2011-09-09 MED ORDER — HYDROCORTISONE SOD SUCCINATE 100 MG IJ SOLR
50.0000 mg | Freq: Three times a day (TID) | INTRAMUSCULAR | Status: DC
  Administered 2011-09-09 – 2011-09-10 (×4): 50 mg via INTRAVENOUS
  Administered 2011-09-11: 10:00:00 via INTRAVENOUS
  Filled 2011-09-09 (×10): qty 1

## 2011-09-09 NOTE — Progress Notes (Signed)
2 Days Post-Op R colectomy for adenoma of cecum Subjective: Doing much with regards to pain control. Breathing without difficultly and feels much better overall today.  Not passing any flatus yet.  Objective: Vital signs in last 24 hours: Temp:  [97.7 F (36.5 C)-98.7 F (37.1 C)] 97.7 F (36.5 C) (04/13 0810) Pulse Rate:  [94-114] 114  (04/13 0810) Resp:  [13-20] 20  (04/13 1008) BP: (137-159)/(66-95) 159/66 mmHg (04/13 0810) SpO2:  [94 %-100 %] 97 % (04/13 1008)    Intake/Output from previous day: 04/12 0701 - 04/13 0700 In: 75 [I.V.:75] Out: 1325 [Urine:1325] Intake/Output this shift: Total I/O In: -  Out: 1100 [Urine:1100]  General appearance: alert, cooperative, appears stated age and no distress Resp: clear to auscultation bilaterally Cardio: regular rate and rhythm and tachycardia to 120 GI: Very rare BS, soft, mildly tender about port sites,   Lab Results:   Basename 09/08/11 0430 09/07/11 1510  WBC 11.8* 13.4*  HGB 13.4 14.4  HCT 38.9 41.7  PLT 248 256   BMET  Basename 09/08/11 0430 09/07/11 1510  NA 135 --  K 3.9 --  CL 101 --  CO2 22 --  GLUCOSE 163* --  BUN 13 --  CREATININE 0.61 0.54  CALCIUM 8.9 --   PT/INR No results found for this basename: LABPROT:2,INR:2 in the last 72 hours ABG No results found for this basename: PHART:2,PCO2:2,PO2:2,HCO3:2 in the last 72 hours  Studies/Results: No results found.  Anti-infectives: Anti-infectives     Start     Dose/Rate Route Frequency Ordered Stop   09/07/11 1600   ertapenem (INVANZ) 1 g in sodium chloride 0.9 % 50 mL IVPB        1 g 100 mL/hr over 30 Minutes Intravenous Every 24 hours 09/07/11 1430 09/07/11 1628   09/06/11 1426   ertapenem (INVANZ) 1 g in sodium chloride 0.9 % 50 mL IVPB        1 g 100 mL/hr over 30 Minutes Intravenous 60 min pre-op 09/06/11 1426 09/07/11 0733          Assessment/Plan: s/p Procedure(s) (LRB): LAPAROSCOPIC PARTIAL COLECTOMY (N/A) for adenoma cecum- POD  #2- will begin sips clears, continues Entereg protocol Pain control- much improved, will DC PCA- she is not using. On Q as well.  COPD- on stress doses of steroids- ? Causing some of tachycardia CV status- tachycardic to 120's- will re check labs, give volume and watch for a little longer here on   SDU Multiple chronic medical issues/ CIDP- usual medications as able DISPO- as above  Murrel Freet,PA-C Pager [phone] General Trauma Pager [phone]   LOS: 2 days

## 2011-09-09 NOTE — Progress Notes (Addendum)
Pain improved this afternoon.  HR down to 118 after bolus.  U/O really good. Up in chair Patient examined and I agree with the assessment and plan  Violeta Gelinas, MD, MPH, FACS Pager: [phone]  09/09/2011 4:02 PM

## 2011-09-09 NOTE — Progress Notes (Signed)
Dilaudid PCA 5 ml wasted in sink. Witnessed by Spero Geralds, RN. Renette Butters, Viona Gilmore

## 2011-09-09 NOTE — Consult Note (Signed)
Patient name: [name] Medical record number: [id number] Date of birth: [birth year] Age: 78 y.o. Gender: female PCP: Letitia Libra, Ala Dach, MD, MD, DR Delford Field - Pulmonary, Dr Ethelene Hal - pain, Dr Donell Beers - CCS, Dr Marjory Lies neurio  09/07/2011 admit LOS 2    PCCM Service  HPI: 78 year old female.  reports that she quit smoking about 33 years ago. 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 Carries copd/asthma diagnosis and on  chronic prednisone 65mg  daily since living in [place] in 2002. Per chart she is gold stage 4 copd without o2 but per her hx "lung functiion is 66% 2 years ago" . Saw primary pulmonary Dr Delford Field several weeks pta  and ceared for hemicolectomy 4/11. Dr Donell Beers requesting post op copd management. Denies wheeze, cough, edema, fever, chills, sputum. STates that she is uanble to cough due to pain and this is umcomfortable for her. Unable to do deep ICS due to post op pain    Subjective/ overnight No sob on RA    Vital Signs BP 143/78  Pulse 115  Temp(Src) 98.4 F (36.9 C) (Oral)  Resp 20  SpO2 96%  RA  Intake/Output Summary (Last 24 hours) at 09/09/11 1548 Last data filed at 09/09/11 1319  Gross per 24 hour  Intake    950 ml  Output   2025 ml  Net  -1075 ml    Physical exam General Appearance:    Alert, cooperative, no distress, appears stated age  Head:    Normocephalic, without obvious abnormality, atraumatic  Eyes:    PERRL, conjunctiva/corneas clear, EOM's intact, fundi    benign, both eyes  Ears:    Normal TM's and external ear canals, both ears  Nose:   Nares normal, septum midline, mucosa normal, no drainage    or sinus tenderness  Throat:   Lips, mucosa, and tongue normal; teeth and gums normal  Neck:   Supple, symmetrical, trachea midline, no adenopathy;    thyroid:  no enlargement/tenderness/nodules; no carotid   bruit or JVD  Back:     Symmetric, no curvature, ROM normal, no CVA tenderness  Lungs:     Clear to auscultation bilaterally, respirations unlabored  Chest  Wall:    No tenderness or deformity   Heart:    Regular rate and rhythm, S1 and S2 normal, no murmur, rub   or gallop  Breast Exam:    No tenderness, masses, or nipple abnormality  Abdomen:     Soft, Scar +. Tender. No mass    no masses, no organomegaly  Genitalia:    Normal female without lesion, discharge or tenderness  Rectal:   Not done  Extremities:   Extremities normal, atraumatic, no cyanosis or edema  Pulses:   2+ and symmetric all extremities  Skin:   Skin color, texture, turgor normal, no rashes or lesions  Lymph nodes:   Cervical, supraclavicular, and axillary nodes normal  Neurologic:   CNII-XII intact, normal strength, sensation and reflexes    throughout     LAB RESULTS  ABG No results found for this basename: phart,  pco2,  po2,  hco3,  tco2,  acidbasedef,  o2sat   Radiology No results found.   Assessment and plan: #Obstructive Lung Disease   -severe copd #Post op COPD and respiratory management #Post operative Pain  PLAN #COPD and Post OP  - continue nebs and dulera  - continue stress dose hydrocortisone 100mg  IV q8h - taper on 09/09/11 - aggressive IS     -   #  PAIn - Post op  - important to control to prevent post op pulmonary complication  - dcd po percocet  - she prefers po line of mgmt: rx oxycodone because she is on po oxycontin (long acting oxycodone) 30mg  bid. Calculated conversion dose for 2mg  dilaudid IV in 12h that has not controlled her pain would be oxycodone 10mg  q6h prn (allowing for 25% cross tolerance dose reduction).  Nurse to offer  - if pain well controlled with po approach, dc dilaudid pca  - if pain not well controlled or ccs cannot manage pain, consider palliative care consult.  - PCCM will not address pain control further   Sandrea Hughs, MD Pulmonary and Critical Care Medicine [facility] Cell [phone]

## 2011-09-10 MED ORDER — PANTOPRAZOLE SODIUM 40 MG IV SOLR
40.0000 mg | INTRAVENOUS | Status: DC
  Administered 2011-09-10: 40 mg via INTRAVENOUS
  Filled 2011-09-10 (×2): qty 40

## 2011-09-10 MED ORDER — SODIUM CHLORIDE 0.9 % IJ SOLN
INTRAMUSCULAR | Status: AC
  Filled 2011-09-10: qty 10

## 2011-09-10 MED ORDER — PHENOBARBITAL 32.4 MG PO TABS
64.8000 mg | ORAL_TABLET | Freq: Every day | ORAL | Status: DC
  Administered 2011-09-10 – 2011-09-13 (×4): 64.8 mg via ORAL
  Filled 2011-09-10 (×3): qty 2

## 2011-09-10 MED ORDER — PHENOBARBITAL 30 MG PO TABS: 30.0000 mg | ORAL_TABLET | ORAL | Status: DC

## 2011-09-10 MED ORDER — PHENOBARBITAL 32.4 MG PO TABS
32.4000 mg | ORAL_TABLET | Freq: Two times a day (BID) | ORAL | Status: DC
  Administered 2011-09-10 – 2011-09-14 (×8): 32.4 mg via ORAL
  Filled 2011-09-10 (×11): qty 1

## 2011-09-10 MED ORDER — METOPROLOL TARTRATE 12.5 MG HALF TABLET
12.5000 mg | ORAL_TABLET | Freq: Two times a day (BID) | ORAL | Status: DC
  Administered 2011-09-10 – 2011-09-14 (×9): 12.5 mg via ORAL
  Filled 2011-09-10 (×15): qty 1

## 2011-09-10 NOTE — Progress Notes (Signed)
Agree with above, prn antihypertensives, tx to floor

## 2011-09-10 NOTE — Progress Notes (Signed)
RT called to assess pt for SOB. Pt was in process of receiving Albuterol neb when RT was called. Upon assessment, pt appeared to be in no obvious respiratory distress. BS clear bilaterally, RR 18, SpO2 97% on RA. Pt states she does feel much better after her neb treatment. Pt states this happens to her quite frequently at home, and she sometimes has to use her inhaler every hour thru-out the day. RT advised pt if she has any more difficulty breathing or SOB to call for another PRN neb. RT will continue to monitor.

## 2011-09-10 NOTE — Progress Notes (Signed)
Patient ID: [name], female   DOB: Oct 26, 1932, 78 y.o.   MRN: [id number] 3 Days Post-Op R colectomy for adenoma of cecum Subjective: Doing much with regards to pain control. Breathing without difficultly and feels much better overall today.  Not passing any flatus yet.  Objective: Vital signs in last 24 hours: Temp:  [98.2 F (36.8 C)-98.4 F (36.9 C)] 98.3 F (36.8 C) (04/14 0739) Pulse Rate:  [97-115] 109  (04/14 0739) Resp:  [12-20] 17  (04/14 0739) BP: (128-148)/(71-78) 145/78 mmHg (04/14 0739) SpO2:  [95 %-100 %] 100 % (04/14 0855)    Intake/Output from previous day: 04/13 0701 - 04/14 0700 In: 1325 [I.V.:825; IV Piggyback:500] Out: 2300 [Urine:2300] Intake/Output this shift: Total I/O In: 510 [P.O.:360; I.V.:150] Out: -   General appearance: alert, cooperative, appears stated age and no distress Resp: clear to auscultation bilaterally Cardio: regular rate and rhythm and less tachycardic GI: + BS, soft, mildly tender , On Q seems empty Lab Results:    Basename 09/09/11 1320 09/08/11 0430  WBC 14.9* 11.8*  HGB 13.1 13.4  HCT 37.8 38.9  PLT 281 248   BMET  Basename 09/09/11 1320 09/08/11 0430  NA 139 135  K 4.3 3.9  CL 102 101  CO2 25 22  GLUCOSE 142* 163*  BUN 13 13  CREATININE 0.50 0.61  CALCIUM 9.6 8.9   PT/INR No results found for this basename: LABPROT:2,INR:2 in the last 72 hours ABG No results found for this basename: PHART:2,PCO2:2,PO2:2,HCO3:2 in the last 72 hours  Studies/Results: No results found.  Anti-infectives: Anti-infectives     Start     Dose/Rate Route Frequency Ordered Stop   09/07/11 1600   ertapenem (INVANZ) 1 g in sodium chloride 0.9 % 50 mL IVPB        1 g 100 mL/hr over 30 Minutes Intravenous Every 24 hours 09/07/11 1430 09/07/11 1628   09/06/11 1426   ertapenem (INVANZ) 1 g in sodium chloride 0.9 % 50 mL IVPB        1 g 100 mL/hr over 30 Minutes Intravenous 60 min pre-op 09/06/11 1426 09/07/11 0733           Assessment/Plan: s/p Procedure(s) (LRB): LAPAROSCOPIC PARTIAL COLECTOMY (N/A) for adenoma cecum- POD #3- will advance to full liquids, continues Entereg protocol Pain control- much improved, will DC PCA- she is not using. On Q as well.  COPD- on stress doses of steroids-with some minimal leukocytosis without fever CV status- tachycardia improved. Labs stable and okay Multiple chronic medical issues/ CIDP- usual medications as able DISPO- Okay to transfer to floor and advance to full liquids  Raelee Rossmann,PA-C Pager [phone] General Trauma Pager [phone]   LOS: 3 days

## 2011-09-11 LAB — URINALYSIS, ROUTINE W REFLEX MICROSCOPIC
Bilirubin Urine: NEGATIVE
Glucose, UA: NEGATIVE mg/dL
Hgb urine dipstick: NEGATIVE
Ketones, ur: NEGATIVE mg/dL
Leukocytes, UA: NEGATIVE
Nitrite: NEGATIVE
Protein, ur: NEGATIVE mg/dL
Specific Gravity, Urine: 1.013 (ref 1.005–1.030)
Urobilinogen, UA: 0.2 mg/dL (ref 0.0–1.0)
pH: 6.5 (ref 5.0–8.0)

## 2011-09-11 MED ORDER — ALBUTEROL SULFATE (5 MG/ML) 0.5% IN NEBU: 2.5000 mg | INHALATION_SOLUTION | RESPIRATORY_TRACT | Status: DC | PRN

## 2011-09-11 MED ORDER — PANTOPRAZOLE SODIUM 40 MG IV SOLR
40.0000 mg | Freq: Two times a day (BID) | INTRAVENOUS | Status: DC
  Filled 2011-09-11 (×3): qty 40

## 2011-09-11 NOTE — Progress Notes (Signed)
Patient ID: [name], female   DOB: July 15, 1932, 78 y.o.   MRN: [id number] 4 Days Post-Op R colectomy for adenoma of cecum Subjective: Good pain control.  ? Urinary incontinence vs small amt stool.    Objective: Vital signs in last 24 hours: Temp:  [97.5 F (36.4 C)-98.4 F (36.9 C)] 97.9 F (36.6 C) (04/15 0621) Pulse Rate:  [80-123] 80  (04/15 0621) Resp:  [15-21] 18  (04/15 0621) BP: (123-185)/(65-90) 123/65 mmHg (04/15 0621) SpO2:  [97 %-99 %] 97 % (04/15 0835) Last BM Date: 09/10/11  Intake/Output from previous day: 04/14 0701 - 04/15 0700 In: 1550 [P.O.:1400; I.V.:150] Out: 650 [Urine:650] Intake/Output this shift:    General appearance: alert, cooperative, appears stated age and no distress Resp:  No distress. Cardio: regular rate and rhythm and less tachycardic GI: + BS, soft, mildly tender , no erythema Lab Results:   Basename 09/09/11 1320  WBC 14.9*  HGB 13.1  HCT 37.8  PLT 281   BMET  Basename 09/09/11 1320  NA 139  K 4.3  CL 102  CO2 25  GLUCOSE 142*  BUN 13  CREATININE 0.50  CALCIUM 9.6   PT/INR No results found for this basename: LABPROT:2,INR:2 in the last 72 hours ABG No results found for this basename: PHART:2,PCO2:2,PO2:2,HCO3:2 in the last 72 hours  Studies/Results: No results found.  Anti-infectives: Anti-infectives     Start     Dose/Rate Route Frequency Ordered Stop   09/07/11 1600   ertapenem (INVANZ) 1 g in sodium chloride 0.9 % 50 mL IVPB        1 g 100 mL/hr over 30 Minutes Intravenous Every 24 hours 09/07/11 1430 09/07/11 1628   09/06/11 1426   ertapenem (INVANZ) 1 g in sodium chloride 0.9 % 50 mL IVPB        1 g 100 mL/hr over 30 Minutes Intravenous 60 min pre-op 09/06/11 1426 09/07/11 0733          Assessment/Plan: s/p Procedure(s) (LRB): LAPAROSCOPIC PARTIAL COLECTOMY (N/A) for adenoma cecum- POD #3- will advance to full liquids, continues Entereg protocol Pain control- home meds and intermittent  doses COPD- decrease steroids today. CV status- tachycardia improved. Labs stable and okay Multiple chronic medical issues/ CIDP- usual medications as able DISPO- Advance to full liquids, Entereg    LOS: 4 days

## 2011-09-11 NOTE — Progress Notes (Signed)
Patient: [name] DOB: 05-09-1933 MRN: [id number] Date of service: 09/11/2011         PCCM PROGRESS NOTE   Brief Hx - 78 year old female. reports that she quit smoking about 33 years ago. 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 Carries copd/asthma diagnosis and on chronic prednisone 65mg  daily since living in [place] in 2002. Per chart she is gold stage 4 copd without o2 but per her hx "lung functiion is 66% 2 years ago" . Saw primary pulmonary Dr Delford Field several weeks pta and ceared for hemicolectomy 4/11. Dr Donell Beers requestedpost op copd management.   Subjective/ Overnight -  C/o urinary incontinence. Denies SOB, cough.    Vitals:  Filed Vitals:   09/10/11 2331 09/11/11 0621 09/11/11 0835 09/11/11 1003  BP: 133/67 123/65  148/69  Pulse: 84 80  104  Temp: 98.4 F (36.9 C) 97.9 F (36.6 C)  97.9 F (36.6 C)  TempSrc: Oral     Resp: 18 18  18   SpO2: 99% 98% 97% 100%  on RA  CBC    Component Value Date/Time   WBC 14.9* 09/09/2011 1320   RBC 4.36 09/09/2011 1320   HGB 13.1 09/09/2011 1320   HCT 37.8 09/09/2011 1320   PLT 281 09/09/2011 1320   MCV 86.7 09/09/2011 1320   MCH 30.0 09/09/2011 1320   MCHC 34.7 09/09/2011 1320   RDW 13.5 09/09/2011 1320   LYMPHSABS 2.2 06/29/2011 1238   MONOABS 0.4 06/29/2011 1238   EOSABS 0.2 06/29/2011 1238   BASOSABS 0.1 06/29/2011 1238    BMET    Component Value Date/Time   NA 139 09/09/2011 1320   K 4.3 09/09/2011 1320   CL 102 09/09/2011 1320   CO2 25 09/09/2011 1320   GLUCOSE 142* 09/09/2011 1320   BUN 13 09/09/2011 1320   CREATININE 0.50 09/09/2011 1320   CREATININE 0.57 06/29/2011 1238   CALCIUM 9.6 09/09/2011 1320   GFRNONAA >90 09/09/2011 1320   GFRAA >90 09/09/2011 1320     Physical Exam -  General: pleasant female, NAD Neuro: intact, MAE CV: s1s2 rrr PULM: resps even non labored on RA, CTA GI: abd soft, mildly tender Extremities: warm and dry no edema    Impression/Plan:  #COPD -- c/b post op pain - continue nebs prn and dulera maint - continue  stress dose hydrocortisone - tapered 4/13-- consider d/c and resume PO pred 4/15 at 20 mg daily as a new ceiling  - aggressive IS  - outpt pulm f/u as before     #Pain - Post laparoscopic partial colectomy.  Much improved.   PLAN -  - important to control to prevent post op pulmonary complication  -cont PO pain rx per CCS      WHITEHEART,KATHRYN, NP 09/11/2011  11:39 AM Pager: [phone]  *Care during the described time interval was provided by me and/or other providers on the critical care team. I have reviewed this patient's available data, including medical history, events of note, physical examination and test results as part of my evaluation.  Sandrea Hughs, MD Pulmonary and Critical Care Medicine [facility] Cell [phone]

## 2011-09-12 MED ORDER — PANTOPRAZOLE SODIUM 40 MG PO TBEC
40.0000 mg | DELAYED_RELEASE_TABLET | Freq: Two times a day (BID) | ORAL | Status: DC
  Administered 2011-09-12 – 2011-09-13 (×3): 40 mg via ORAL
  Filled 2011-09-12 (×4): qty 1

## 2011-09-12 MED ORDER — LACTATED RINGERS IV BOLUS (SEPSIS)
1000.0000 mL | Freq: Once | INTRAVENOUS | Status: AC
  Administered 2011-09-12: 1000 mL via INTRAVENOUS

## 2011-09-12 NOTE — Progress Notes (Signed)
Patient ID: [name], female   DOB: August 09, 1932, 78 y.o.   MRN: [id number] 5 Days Post-Op R colectomy for adenoma of cecum Subjective: Remains without flatus.  No nausea.  Tolerating full liquids.     Objective: Vital signs in last 24 hours: Temp:  [98.1 F (36.7 C)-98.3 F (36.8 C)] 98.3 F (36.8 C) (04/16 1478) Pulse Rate:  [75-90] 75  (04/16 0613) Resp:  [16-18] 18  (04/16 0613) BP: (123-154)/(57-68) 132/61 mmHg (04/16 0613) SpO2:  [98 %-100 %] 98 % (04/16 0613) Last BM Date: 09/11/11  Intake/Output from previous day: 04/15 0701 - 04/16 0700 In: 720 [P.O.:720] Out: 1300 [Urine:1300] Intake/Output this shift:    General appearance: alert, cooperative, appears stated age Resp:  No distress. Cardio: regular rate and rhythm and less tachycardic GI: + BS, soft, mildly tender , no erythema Non distended.    Lab Results:   Basename 09/09/11 1320  WBC 14.9*  HGB 13.1  HCT 37.8  PLT 281   BMET  Basename 09/09/11 1320  NA 139  K 4.3  CL 102  CO2 25  GLUCOSE 142*  BUN 13  CREATININE 0.50  CALCIUM 9.6   PT/INR No results found for this basename: LABPROT:2,INR:2 in the last 72 hours ABG No results found for this basename: PHART:2,PCO2:2,PO2:2,HCO3:2 in the last 72 hours  Studies/Results: No results found.  Anti-infectives: Anti-infectives     Start     Dose/Rate Route Frequency Ordered Stop   09/07/11 1600   ertapenem (INVANZ) 1 g in sodium chloride 0.9 % 50 mL IVPB        1 g 100 mL/hr over 30 Minutes Intravenous Every 24 hours 09/07/11 1430 09/07/11 1628   09/06/11 1426   ertapenem (INVANZ) 1 g in sodium chloride 0.9 % 50 mL IVPB        1 g 100 mL/hr over 30 Minutes Intravenous 60 min pre-op 09/06/11 1426 09/07/11 0733          Assessment/Plan: s/p Procedure(s) (LRB): LAPAROSCOPIC PARTIAL COLECTOMY (N/A) for adenoma cecum- POD #5- will advance to full liquids, continues Entereg protocol Pain control- home meds and intermittent doses of  IV COPD- Steroids stopped by pulmonary while inpt.   CV status- tachycardia improved. Labs stable and okay.  Recheck labs in AM Multiple chronic medical issues/ CIDP- usual medications as able DISPO- Full liquids, Entereg, await return of bowel function. Path - benign.      LOS: 5 days

## 2011-09-12 NOTE — Progress Notes (Signed)
UR of chart complete.  

## 2011-09-12 NOTE — Progress Notes (Signed)
Patient: [name] DOB: 09-Apr-1933 MRN: [id number] Date of service: 09/12/2011         PCCM PROGRESS NOTE   Brief Hx - 78 year old female. reports that she quit smoking about 33 years ago. 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 Carries copd/asthma diagnosis and on chronic prednisone 65mg  daily since living in [place] in 2002. Per chart she is gold stage 4 copd without o2 but per her hx "lung functiion is 66% 2 years ago" . Saw primary pulmonary Dr Delford Field several weeks pta and ceared for hemicolectomy 4/11. Dr Donell Beers requested post op copd management.   Subjective/ Overnight -   Denies SOB, cough or need for extra breathing treatments   Vitals:  Filed Vitals:   09/12/11 1016 09/12/11 1124 09/12/11 1137 09/12/11 1413  BP: 106/52 106/52  127/57  Pulse: 93 93  91  Temp:    97.7 F (36.5 C)  TempSrc:      Resp:    18  Height:      Weight:   119 lb 14.4 oz (54.386 kg)   SpO2: 98%   99%  on RA  CBC    Component Value Date/Time   WBC 14.9* 09/09/2011 1320   RBC 4.36 09/09/2011 1320   HGB 13.1 09/09/2011 1320   HCT 37.8 09/09/2011 1320   PLT 281 09/09/2011 1320   MCV 86.7 09/09/2011 1320   MCH 30.0 09/09/2011 1320   MCHC 34.7 09/09/2011 1320   RDW 13.5 09/09/2011 1320   LYMPHSABS 2.2 06/29/2011 1238   MONOABS 0.4 06/29/2011 1238   EOSABS 0.2 06/29/2011 1238   BASOSABS 0.1 06/29/2011 1238    BMET    Component Value Date/Time   NA 139 09/09/2011 1320   K 4.3 09/09/2011 1320   CL 102 09/09/2011 1320   CO2 25 09/09/2011 1320   GLUCOSE 142* 09/09/2011 1320   BUN 13 09/09/2011 1320   CREATININE 0.50 09/09/2011 1320   CREATININE 0.57 06/29/2011 1238   CALCIUM 9.6 09/09/2011 1320   GFRNONAA >90 09/09/2011 1320   GFRAA >90 09/09/2011 1320     Physical Exam -  General: pleasant female, NAD flat in bed, RA Neuro: intact, MAE CV: s1s2 rrr PULM: resps even non labored on RA, CTA GI: abd soft, mildly tender Extremities: warm and dry no edema    Impression/Plan:  #COPD -- c/b post op pain - continue nebs  prn and dulera maint - continue stress dose hydrocortisone - tapered 4/13 --  resumed PO pred 4/15 at 20 mg daily as a new ceiling  - aggressive IS  - outpt pulm f/u as before     #Pain - Post laparoscopic partial colectomy.  Much improved.   PLAN -  - important to control to prevent post op pulmonary complication  -cont PO pain rx per [facility]    Unable to teach her optimal mdi technique despite prolonged bedside coaching she is no more than 5% effecitve with it.  Sandrea Hughs, MD Pulmonary and Critical Care Medicine [facility] Cell [phone]

## 2011-09-13 LAB — BASIC METABOLIC PANEL
BUN: 13 mg/dL (ref 6–23)
CO2: 30 mEq/L (ref 19–32)
Calcium: 8.3 mg/dL — ABNORMAL LOW (ref 8.4–10.5)
Chloride: 104 mEq/L (ref 96–112)
Creatinine, Ser: 0.63 mg/dL (ref 0.50–1.10)
GFR calc Af Amer: >90 mL/min (ref >90)
GFR calc non Af Amer: 84 mL/min — ABNORMAL LOW (ref >90)
Glucose, Bld: 82 mg/dL (ref 70–99)
Potassium: 2.5 mEq/L — CL (ref 3.5–5.1)
Sodium: 142 mEq/L (ref 135–145)

## 2011-09-13 LAB — CBC
HCT: 32 % — ABNORMAL LOW (ref 36.0–46.0)
Hemoglobin: 10.8 g/dL — ABNORMAL LOW (ref 12.0–15.0)
MCH: 29.6 pg (ref 26.0–34.0)
MCHC: 33.8 g/dL (ref 30.0–36.0)
MCV: 87.7 fL (ref 78.0–100.0)
Platelets: 316 10*3/uL (ref 150–400)
RBC: 3.65 MIL/uL — ABNORMAL LOW (ref 3.87–5.11)
RDW: 13.7 % (ref 11.5–15.5)
WBC: 6.9 10*3/uL (ref 4.0–10.5)

## 2011-09-13 MED ORDER — BUDESONIDE 0.25 MG/2ML IN SUSP
0.2500 mg | Freq: Two times a day (BID) | RESPIRATORY_TRACT | Status: DC
  Administered 2011-09-13 – 2011-09-14 (×3): 0.25 mg via RESPIRATORY_TRACT
  Filled 2011-09-13 (×5): qty 2

## 2011-09-13 MED ORDER — ARFORMOTEROL TARTRATE 15 MCG/2ML IN NEBU
15.0000 ug | INHALATION_SOLUTION | Freq: Two times a day (BID) | RESPIRATORY_TRACT | Status: DC
  Administered 2011-09-13 – 2011-09-14 (×3): 15 ug via RESPIRATORY_TRACT
  Filled 2011-09-13 (×5): qty 2

## 2011-09-13 MED ORDER — POTASSIUM CHLORIDE CRYS ER 20 MEQ PO TBCR
40.0000 meq | EXTENDED_RELEASE_TABLET | Freq: Two times a day (BID) | ORAL | Status: AC
  Administered 2011-09-13 (×2): 40 meq via ORAL
  Filled 2011-09-13 (×2): qty 2

## 2011-09-13 NOTE — Progress Notes (Signed)
Patient: [name] DOB: Jul 23, 1932 MRN: [id number] Date of service: 09/13/2011         PCCM PROGRESS NOTE   Brief Hx - 78 year old female. reports that she quit smoking about 33 years ago. 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 Carries copd/asthma diagnosis and on chronic prednisone 65mg  daily since living in [place] in 2002. Per chart she is gold stage 4 copd without o2 but per her hx "lung functiion is 66% 2 years ago" . Saw primary pulmonary Dr Delford Field several weeks pta and ceared for hemicolectomy 4/11. Dr Donell Beers requested post op copd management.   Subjective/ Overnight -  Doing well, cannot use hfa dulera  Vitals:  Filed Vitals:   09/12/11 1137 09/12/11 1413 09/12/11 2124 09/13/11 0500  BP:  127/57 131/59 116/49  Pulse:  91 80 76  Temp:  97.7 F (36.5 C) 99.2 F (37.3 C) 98.8 F (37.1 C)  TempSrc:   Oral Oral  Resp:  18 18 16   Height:      Weight: 54.386 kg (119 lb 14.4 oz)     SpO2:  99% 97% 98%  on RA  CBC    Component Value Date/Time   WBC 14.9* 09/09/2011 1320   RBC 4.36 09/09/2011 1320   HGB 13.1 09/09/2011 1320   HCT 37.8 09/09/2011 1320   PLT 281 09/09/2011 1320   MCV 86.7 09/09/2011 1320   MCH 30.0 09/09/2011 1320   MCHC 34.7 09/09/2011 1320   RDW 13.5 09/09/2011 1320   LYMPHSABS 2.2 06/29/2011 1238   MONOABS 0.4 06/29/2011 1238   EOSABS 0.2 06/29/2011 1238   BASOSABS 0.1 06/29/2011 1238    BMET    Component Value Date/Time   NA 139 09/09/2011 1320   K 4.3 09/09/2011 1320   CL 102 09/09/2011 1320   CO2 25 09/09/2011 1320   GLUCOSE 142* 09/09/2011 1320   BUN 13 09/09/2011 1320   CREATININE 0.50 09/09/2011 1320   CREATININE 0.57 06/29/2011 1238   CALCIUM 9.6 09/09/2011 1320   GFRNONAA >90 09/09/2011 1320   GFRAA >90 09/09/2011 1320     Physical Exam -  General: pleasant female, NAD flat in bed, RA Neuro: intact, MAE CV: s1s2 rrr PULM: resps even non labored on RA, CTA GI: abd soft, mildly tender Extremities: warm and dry no edema    Impression/Plan:  #COPD -- c/b post op  pain - d/c dulera, not able to use HFA devices -start brovana and budesonide bid in neb and cont at home - continue stress dose hydrocortisone - tapered 4/13 --  resumed PO pred 4/15 at 20 mg daily as a new ceiling  - aggressive IS  - outpt pulm f/u as before     #Pain - Post laparoscopic partial colectomy.  Much improved.   PLAN -  - important to control to prevent post op pulmonary complication  -cont PO pain rx per [facility]    Shan Levans Beeper  [phone]  Cell  [phone]  If no response or cell goes to voicemail, call beeper [phone]

## 2011-09-13 NOTE — Progress Notes (Signed)
Spoke with pt re: her neb meds and how to obtain those. The order said to use [facility]. Pt explained that she had used [facility] previously when she was in [place]. I asked her what her preference was for obtaining these new meds. She stated that she uses [facility] for all of her medication needs now that she is here and she would prefer to get all of her meds from them rather than two from [place] and the rest from this pharmacy.    Notified the nurse of patient's preference. Pt will need Rx for these two meds when she is ready for d/c, along with any other medications she needs.

## 2011-09-13 NOTE — Progress Notes (Signed)
Pt voided 300 cc of yellow urine,checked post void residual 400 cc,foley catheter inserted,pt tolerated well.

## 2011-09-13 NOTE — Progress Notes (Signed)
Patient ID: [name], female   DOB: [birth year], 78 y.o.   MRN: [id number] 6 Days Post-Op R colectomy for adenoma of cecum Subjective: Had BM yesterday.  No nausea or vomiting.  No respiratory distress off steroids.  Objective: Vital signs in last 24 hours: Temp:  [97.7 F (36.5 C)-99.2 F (37.3 C)] 98.8 F (37.1 C) (04/17 0500) Pulse Rate:  [76-93] 76  (04/17 0500) Resp:  [16-18] 16  (04/17 0500) BP: (106-131)/(49-59) 116/49 mmHg (04/17 0500) SpO2:  [97 %-99 %] 98 % (04/17 0500) Weight:  [119 lb 14.4 oz (54.386 kg)] 119 lb 14.4 oz (54.386 kg) (04/16 1137) Last BM Date: 09/11/11  Intake/Output from previous day: 04/16 0701 - 04/17 0700 In: 840 [P.O.:840] Out: 1700 [Urine:1700] Intake/Output this shift: Total I/O In: -  Out: 1550 [Urine:1550]  General appearance: sleeping, but arousable, cooperative, appears stated age Resp:  No distress. GI: + BS, soft, non tender, no erythema Non distended.    Lab Results:  No results found for this basename: WBC:2,HGB:2,HCT:2,PLT:2 in the last 72 hours BMET No results found for this basename: NA:2,K:2,CL:2,CO2:2,GLUCOSE:2,BUN:2,CREATININE:2,CALCIUM:2 in the last 72 hours PT/INR No results found for this basename: LABPROT:2,INR:2 in the last 72 hours ABG No results found for this basename: PHART:2,PCO2:2,PO2:2,HCO3:2 in the last 72 hours  Studies/Results: No results found.  Anti-infectives: Anti-infectives     Start     Dose/Rate Route Frequency Ordered Stop   09/07/11 1600   ertapenem (INVANZ) 1 g in sodium chloride 0.9 % 50 mL IVPB        1 g 100 mL/hr over 30 Minutes Intravenous Every 24 hours 09/07/11 1430 09/07/11 1628   09/06/11 1426   ertapenem (INVANZ) 1 g in sodium chloride 0.9 % 50 mL IVPB        1 g 100 mL/hr over 30 Minutes Intravenous 60 min pre-op 09/06/11 1426 09/07/11 0733          Assessment/Plan: s/p Procedure(s) (LRB): LAPAROSCOPIC PARTIAL COLECTOMY (N/A) for adenoma cecum- POD #6.   Pain  control- home meds and intermittent doses of IV COPD- Steroids stopped by pulmonary while inpt.   CV status- tachycardia improved. Labs stable and okay.  Recheck labs in AM Multiple chronic medical issues/ CIDP- usual medications as able DISPO- regular diet, Entereg, watch for possible ileus on reg diet. Urinary retention - attempt to pull foley again and check post void residuals.   Path - benign.   Hope for home tomorrow or next day depending on bowel function and urinary function.      LOS: 6 days

## 2011-09-14 ENCOUNTER — Telehealth: Payer: Self-pay | Admitting: Critical Care Medicine

## 2011-09-14 LAB — CREATININE, SERUM
Creatinine, Ser: 0.56 mg/dL (ref 0.50–1.10)
GFR calc Af Amer: >90 mL/min (ref >90)
GFR calc non Af Amer: 88 mL/min — ABNORMAL LOW (ref >90)

## 2011-09-14 MED ORDER — POTASSIUM CHLORIDE CRYS ER 20 MEQ PO TBCR: 60.0000 meq | EXTENDED_RELEASE_TABLET | Freq: Two times a day (BID) | ORAL | Status: DC

## 2011-09-14 MED ORDER — ARFORMOTEROL TARTRATE 15 MCG/2ML IN NEBU: 15.0000 ug | INHALATION_SOLUTION | Freq: Two times a day (BID) | RESPIRATORY_TRACT | Status: DC

## 2011-09-14 MED ORDER — BUDESONIDE 0.25 MG/2ML IN SUSP: 0.2500 mg | Freq: Two times a day (BID) | RESPIRATORY_TRACT | Status: DC

## 2011-09-14 MED ORDER — POTASSIUM CHLORIDE CRYS ER 20 MEQ PO TBCR
80.0000 meq | EXTENDED_RELEASE_TABLET | Freq: Two times a day (BID) | ORAL | Status: DC
  Administered 2011-09-14: 80 meq via ORAL
  Filled 2011-09-14 (×2): qty 4

## 2011-09-14 MED ORDER — OXYCODONE-ACETAMINOPHEN 10-325 MG PO TABS: 1.0000 | ORAL_TABLET | ORAL | Status: DC | PRN

## 2011-09-14 MED ORDER — PREDNISONE 20 MG PO TABS: 20.0000 mg | ORAL_TABLET | Freq: Every day | ORAL | Status: DC

## 2011-09-14 NOTE — Discharge Summary (Signed)
Physician Discharge Summary  Patient ID: [name] MRN: [id number] DOB/AGE: 76-Jul-1934 76 y.o.  Admit date: 09/07/2011 Discharge date: 09/14/2011  Admission Diagnoses: Cecal adenoma.  Patient Active Problem List  Diagnoses  . Anemia  . Asthma  . COPD (chronic obstructive pulmonary disease)  . Depression  . GERD (gastroesophageal reflux disease)  . Hyperlipidemia  . Hypertension  . Allergic rhinitis  . Pulmonary embolism  . Skin cancer  . Seizures  . Neuropathy  . Chronic neck pain  . Rectocele  . Hypersomnia  . Vitamin d deficiency  . Family history of colon cancer  . Abnormal blood chemistry  . Skin lesion  . Adenoma of cecum  . Chronic constipation  . Osteoporosis  . Malignant hyperthermia    Discharge Diagnoses:  Same  Discharged Condition: good  Hospital Course:  Pt admitted to stepdown after laparoscopic right colectomy for cecal adenoma.  She was sent to stepdown due to her significant COPD.  She actually did very well and weaned off oxygen within 24 hours.  She did have a slow return of bowel function which was expected due to her chronic narcotic use and chronic constipation.  She was able to wean to oral narcotics.  Her diet was able to be advanced.  She had to have her foley reinserted twice for urinary retention.  She continued to not feel her bowel movements due to neuropathy, but this was unchanged from her baseline.  She was able to ambulate without difficulty.  Dr. Delford Field saw her for her COPD and adjusted her pulmonary medications.  She was discharged to home with foley in good condition.    Consults: pulmonary/intensive care  Significant Diagnostic Studies: labs: K 2.5, path benign  Treatments: respiratory therapy: albuterol/atropine nebulizer and surgery: lap R colectomy  Discharge Exam: Blood pressure 128/61, pulse 91, temperature 97.8 F (36.6 C), temperature source Oral, resp. rate 18, height 5\' 2"  (1.575 m), weight 119 lb 14.4 oz (54.386  kg), SpO2 97.00%. General appearance: alert, cooperative and no distress Chest wall: no tenderness GI: incision without erythema.  Soft, non distended.  Disposition: 01-Home or Self Care  Discharge Orders    Future Appointments: Provider: Department: Dept Phone: Center:   09/26/2011 10:15 AM Edwyna Perfect, MD [facility] [phone] [facility]     Future Orders Please Complete By Expires   Diet - low sodium heart healthy      Increase activity slowly      Change dressing (specify)      Comments:   Empty foley as needed.  Follow up in 3-7 days with urology   Call MD for:  temperature >100.4      Call MD for:  persistant nausea and vomiting      Call MD for:  severe uncontrolled pain      Call MD for:  redness, tenderness, or signs of infection (pain, swelling, redness, odor or green/yellow discharge around incision site)        Medication List  As of 09/14/2011  5:14 PM   STOP taking these medications         mometasone-formoterol 100-5 MCG/ACT Aero         TAKE these medications         albuterol (2.5 MG/3ML) 0.083% nebulizer solution   Commonly known as: PROVENTIL   Take 2.5 mg by nebulization 2 (two) times daily.      PROAIR HFA 108 (90 BASE) MCG/ACT inhaler   Generic drug: albuterol   Inhale 2  puffs into the lungs 2 (two) times daily.      amLODipine 10 MG tablet   Commonly known as: NORVASC   Take 1 tablet (10 mg total) by mouth daily.      amphetamine-dextroamphetamine 10 MG tablet   Commonly known as: ADDERALL      arformoterol 15 MCG/2ML Nebu   Commonly known as: BROVANA   Take 2 mLs (15 mcg total) by nebulization 2 (two) times daily.      budesonide 0.25 MG/2ML nebulizer solution   Commonly known as: PULMICORT   Take 2 mLs (0.25 mg total) by nebulization 2 (two) times daily.      CALCIUM-VITAMIN D PO   Take 1 tablet by mouth daily.      cetirizine 10 MG tablet   Commonly known as: ZYRTEC   Take 10 mg by mouth daily.      CHLOR-TRIMETON 4 MG  tablet   Generic drug: chlorpheniramine   Take 4 mg by mouth daily.      dexlansoprazole 60 MG capsule   Commonly known as: DEXILANT   Take 1 capsule (60 mg total) by mouth daily.      DEXILANT 60 MG capsule   Generic drug: dexlansoprazole   Take 60 mg by mouth Daily.      ergocalciferol 50000 UNITS capsule   Commonly known as: VITAMIN D2   Take 50,000 Units by mouth once a week. Takes on Monday      estradiol 1 MG tablet   Commonly known as: ESTRACE   Take 1 mg by mouth every other day.      Insulin Pen Needle 31G X 8 MM Misc   Use to give Forteo injection once a day for 28 days.      lidocaine 5 %   Commonly known as: LIDODERM   Place 2 patches onto the skin daily. Remove & Discard patch within 12 hours or as directed by MD      mometasone 50 MCG/ACT nasal spray   Commonly known as: NASONEX   Place 2 sprays into the nose daily.      montelukast 10 MG tablet   Commonly known as: SINGULAIR   Take 10 mg by mouth at bedtime.      ondansetron 4 MG disintegrating tablet   Commonly known as: ZOFRAN-ODT   Take 4 mg by mouth every 8 (eight) hours as needed. For nausea      oxyCODONE-acetaminophen 10-325 MG per tablet   Commonly known as: PERCOCET   Take 1 tablet by mouth every 4 (four) hours as needed for pain. For breakthrough pain.  Stated she hasn't had to use since starting on Oxycontin 12h      OXYCONTIN 30 MG Tb12   Generic drug: oxycodone   Take 30 mg by mouth every 12 (twelve) hours.      peg 3350 powder Solr   Commonly known as: MOVIPREP   Take 1 kit (100 g total) by mouth once.      PHENobarbital 32.4 MG tablet   Commonly known as: LUMINAL   Take 32.4-64.8 mg by mouth 2 (two) times daily. Takes 1 tab at 6 AM, 1 tab at noon, and 2 tabs at 8 PM      polyethylene glycol packet   Commonly known as: MIRALAX / GLYCOLAX   Take 17 g by mouth daily.      potassium chloride SA 20 MEQ tablet   Commonly known as: K-DUR,KLOR-CON   Take 3 tablets (60 mEq  total) by  mouth 2 (two) times daily.      predniSONE 20 MG tablet   Commonly known as: DELTASONE   Take 1 tablet (20 mg total) by mouth daily.      simvastatin 40 MG tablet   Commonly known as: ZOCOR   Take 1 tablet (40 mg total) by mouth at bedtime.      Teriparatide (Recombinant) 600 MCG/2.4ML Soln   Inject 0.08 mLs (20 mcg total) into the skin daily. Inject subcutaneously once a day for 28 days.      theophylline 100 MG 12 hr tablet   Commonly known as: THEODUR   Take 100 mg by mouth 4 (four) times daily.      VOLTAREN 1 % Gel   Generic drug: diclofenac sodium   Apply 1 application topically as needed. To gout on fingers and hands           Follow-up Information    Follow up with [facility], MD. Schedule an appointment as soon as possible for a visit in 3 weeks.   Contact information:   [employer] [address] [place] [address] [phone]       Follow up with Crecencio Mc, MD. Call in 1 week. (or urology PA)    Contact information:   [address] [place] [facility] [place] [address] [phone]       Follow up with Shan Levans, MD. Schedule an appointment as soon as possible for a visit in 2 weeks. (See Dr Delford Field in [facility] in two weeks)    Contact information:   [address] [facility] [address] [place] [address] [place] [address] [phone]          Signed: Almond Lint 09/14/2011, 5:14 PM

## 2011-09-14 NOTE — Telephone Encounter (Signed)
[name], Please advise where to put patient on schedule.

## 2011-09-14 NOTE — Progress Notes (Signed)
Pt given dc instructions. Rn went over medications and incision/wound care. Pt verbalized understanding and had no questions regarding care of instructions.  

## 2011-09-14 NOTE — Discharge Instructions (Signed)
[facility]      [facility], [place] [phone]  ABDOMINAL SURGERY: POST OP INSTRUCTIONS  Always review your discharge instruction sheet given to you by the facility where your surgery was performed.  IF YOU HAVE DISABILITY OR FAMILY LEAVE FORMS, YOU MUST BRING THEM TO THE OFFICE FOR PROCESSING.  PLEASE DO NOT GIVE THEM TO YOUR DOCTOR.  1. A prescription for pain medication may be given to you upon discharge.  Take your pain medication as prescribed, if needed.  If narcotic pain medicine is not needed, then you may take acetaminophen (Tylenol) or ibuprofen (Advil) as needed. 2. Take your usually prescribed medications unless otherwise directed. 3. If you need a refill on your pain medication, please contact your pharmacy. They will contact our office to request authorization.  Prescriptions will not be filled after 5pm or on week-ends. 4. You should follow a light diet the first few days after arrival home, such as soup and crackers, pudding, etc.unless your doctor has advised otherwise. A high-fiber, low fat diet can be resumed as tolerated.   Be sure to include lots of fluids daily. Most patients will experience some swelling and bruising on the chest and neck area.  Ice packs will help.  Swelling and bruising can take several days to resolve 5. Most patients will experience some swelling and bruising in the area of the incision. Ice pack will help. Swelling and bruising can take several days to resolve..  6. It is common to experience some constipation if taking pain medication after surgery.  Increasing fluid intake and taking a stool softener will usually help or prevent this problem from occurring.  A mild laxative (Milk of Magnesia or Miralax) should be taken according to package directions if there are no bowel movements after 48 hours. 7.  You may have steri-strips (small skin tapes) in place directly over the incision.  These strips should be left on the skin for 10-14 days.  If your  surgeon used skin glue on the incision, you may shower in 24 hours.  The glue will flake off over the next 2-3 weeks.  Any sutures or staples will be removed at the office during your follow-up visit. You may find that a light gauze bandage over your incision may keep your staples from being rubbed or pulled. You may shower and replace the bandage daily. 8. ACTIVITIES:  You may resume regular (light) daily activities beginning the next day--such as daily self-care, walking, climbing stairs--gradually increasing activities as tolerated.  You may have sexual intercourse when it is comfortable.  Refrain from any heavy lifting or straining until approved by your doctor. a. You may drive when you no longer are taking prescription pain medication, you can comfortably wear a seatbelt, and you can safely maneuver your car and apply brakes b. Return to Work: ___________N/A________________________ 9. You should see your doctor in the office for a follow-up appointment approximately two weeks after your surgery.  Make sure that you call for this appointment within a day or two after you arrive home to insure a convenient appointment time. OTHER INSTRUCTIONS:  _____________________________________________________________ _____________________________________________________________  WHEN TO CALL YOUR DOCTOR: 1. Fever over 101.0 2. Inability to urinate 3. Nausea and/or vomiting 4. Extreme swelling or bruising 5. Continued bleeding from incision. 6. Increased pain, redness, or drainage from the incision. 7. Difficulty swallowing or breathing 8. Muscle cramping or spasms. 9. Numbness or tingling in hands or feet or around lips.  The clinic staff is available to answer  your questions during regular business hours.  Please don't hesitate to call and ask to speak to one of the nurses if you have concerns.  For further questions, please visit [email or web address]   

## 2011-09-14 NOTE — Progress Notes (Signed)
Patient: [name] DOB: 02-May-1933 MRN: [id number] Date of service: 09/14/2011         PCCM PROGRESS NOTE   Brief Hx - 78 year old female. reports that she quit smoking about 33 years ago. 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 Carries copd/asthma diagnosis and on chronic prednisone 65mg  daily since living in [place] in 2002. Per chart she is gold stage 4 copd without o2 but per her hx "lung functiion is 66% 2 years ago" . Saw primary pulmonary Dr Delford Field several weeks pta and ceared for hemicolectomy 4/11. Dr Donell Beers requested post op copd management.   Subjective/ Overnight -  Doing well, ready for d/v Vitals:  Filed Vitals:   09/13/11 1300 09/13/11 2218 09/14/11 0600 09/14/11 0917  BP: 131/78 138/62 113/63 128/61  Pulse: 85 81 78 91  Temp: 97.9 F (36.6 C) 97.9 F (36.6 C) 97.8 F (36.6 C)   TempSrc: Oral Oral Oral   Resp: 18 18 18    Height:      Weight:      SpO2: 100% 100% 97%   on RA  CBC    Component Value Date/Time   WBC 6.9 09/13/2011 0621   RBC 3.65* 09/13/2011 0621   HGB 10.8* 09/13/2011 0621   HCT 32.0* 09/13/2011 0621   PLT 316 09/13/2011 0621   MCV 87.7 09/13/2011 0621   MCH 29.6 09/13/2011 0621   MCHC 33.8 09/13/2011 0621   RDW 13.7 09/13/2011 0621   LYMPHSABS 2.2 06/29/2011 1238   MONOABS 0.4 06/29/2011 1238   EOSABS 0.2 06/29/2011 1238   BASOSABS 0.1 06/29/2011 1238    BMET    Component Value Date/Time   NA 142 09/13/2011 0621   K 2.5* 09/13/2011 0621   CL 104 09/13/2011 0621   CO2 30 09/13/2011 0621   GLUCOSE 82 09/13/2011 0621   BUN 13 09/13/2011 0621   CREATININE 0.56 09/14/2011 0543   CREATININE 0.57 06/29/2011 1238   CALCIUM 8.3* 09/13/2011 0621   GFRNONAA 88* 09/14/2011 0543   GFRAA >90 09/14/2011 0543     Physical Exam -  BP 128/61  Pulse 91  Temp(Src) 97.8 F (36.6 C) (Oral)  Resp 18  Ht 5\' 2"  (1.575 m)  Wt 54.386 kg (119 lb 14.4 oz)  BMI 21.93 kg/m2  SpO2 97%  General: pleasant female, NAD flat in bed, RA Neuro: intact, MAE CV: s1s2 rrr PULM: resps even non  labored on RA, CTA GI: abd soft, mildly tender Extremities: warm and dry no edema    Impression/Plan:  #COPD -- c/b post op pain --d/c on  brovana and budesonide bid in neb  -d/c on prednisone 20mg  daily - aggressive IS  - outpt pulm f/u as before  In two weeks in [place] office -d/c dulera -resume theophylline -no need for home oxygen    Shan Levans Beeper  [phone]  Cell  [phone]  If no response or cell goes to voicemail, call beeper [phone]

## 2011-09-15 ENCOUNTER — Telehealth: Payer: Self-pay | Admitting: Internal Medicine

## 2011-09-15 ENCOUNTER — Other Ambulatory Visit (INDEPENDENT_AMBULATORY_CARE_PROVIDER_SITE_OTHER): Payer: Self-pay | Admitting: General Surgery

## 2011-09-15 ENCOUNTER — Telehealth (INDEPENDENT_AMBULATORY_CARE_PROVIDER_SITE_OTHER): Payer: Self-pay | Admitting: General Surgery

## 2011-09-15 DIAGNOSIS — C50911 Malignant neoplasm of unspecified site of right female breast: Secondary | ICD-10-CM

## 2011-09-15 NOTE — Telephone Encounter (Signed)
Bernie from [facility] states that patient was just discharged from hospital with a catheter. Her surgeon wants patient to be seen on Monday or Tuesday of next week at [facility]. Cyndra Numbers states that patient already has an appointment at [facility] on Wednesday, 09/20/11. Is it okay for patient to wait that extra day to be seen or should Cyndra Numbers reschedule her appointment for Monday or Tuesday?

## 2011-09-15 NOTE — Telephone Encounter (Signed)
Call returned to [place] at [phone], she was informed per Dr Rodena Medin instructions.

## 2011-09-15 NOTE — Telephone Encounter (Signed)
Think that extra 24 hours is ok

## 2011-09-15 NOTE — Telephone Encounter (Signed)
Son calling for his mom and dad; they were offered Home Health before leaving hospital but wanted to try it on their own.  Son states they cannot do it alone and are now asking for Home Health care, preferably [place].

## 2011-09-15 NOTE — Telephone Encounter (Signed)
lmomtcb x1 to schedule ov

## 2011-09-15 NOTE — Telephone Encounter (Signed)
You can use the 4:15 slot on 5/2 in [facility].  I have held this slot for this.  If you do not use it, pls let me know.  Thank you.

## 2011-09-18 ENCOUNTER — Telehealth: Payer: Self-pay | Admitting: *Deleted

## 2011-09-18 ENCOUNTER — Telehealth (INDEPENDENT_AMBULATORY_CARE_PROVIDER_SITE_OTHER): Payer: Self-pay | Admitting: General Surgery

## 2011-09-18 NOTE — Telephone Encounter (Signed)
Patient sent fax stating: " April 11 th I had my colon surgery, home on day 7 with a foley in placed. While in hospital ,had potassium problems &  Dr Donell Beers placed me on potassium chloride 20 meq ter (B-K-Dur 20) 3 tablets 2 x daily which I am to continue. Also, Lopressor had to be added to keep my BP in control. Home Health likely will come today. I see [facility] Wed AM, Dr Alen Bleacher office  May 2 and you April 30th. Dr. Donell Beers in 2-3 weeks. I am doing fine. But want to bring you up to date".

## 2011-09-18 NOTE — Telephone Encounter (Signed)
appt already scheduled - called spoke with patient's husband, verified the date/time and location of appt.  Advised to call for sooner follow up if needed, pt's husband verbalized his understanding.  Will sign off.

## 2011-09-19 ENCOUNTER — Telehealth (INDEPENDENT_AMBULATORY_CARE_PROVIDER_SITE_OTHER): Payer: Self-pay | Admitting: General Surgery

## 2011-09-19 NOTE — Telephone Encounter (Signed)
Amy Linke, with [facility], calling in response to husband's concern about serum K+ for this pt.  [facility] was at the home for their initial assessment today.  Husband expressed great concern about her potassium levels and how she needed replacement, etc.  Home health nurse calling to see if you need her to get labs when she goes back on Thursday.  Her cell number is [phone].  Please advise.

## 2011-09-19 NOTE — Telephone Encounter (Signed)
Please get home health to recheck potassium on Thursday. Tx FB

## 2011-09-20 ENCOUNTER — Telehealth (INDEPENDENT_AMBULATORY_CARE_PROVIDER_SITE_OTHER): Payer: Self-pay

## 2011-09-21 ENCOUNTER — Telehealth: Payer: Self-pay | Admitting: Internal Medicine

## 2011-09-21 MED ORDER — MONTELUKAST SODIUM 10 MG PO TABS: 10.0000 mg | ORAL_TABLET | Freq: Every day | ORAL | Status: DC

## 2011-09-21 NOTE — Telephone Encounter (Signed)
Rx refill sent to pharmacy. 

## 2011-09-21 NOTE — Telephone Encounter (Signed)
Close encounter 

## 2011-09-21 NOTE — Telephone Encounter (Signed)
#  90 [id number] 

## 2011-09-21 NOTE — Telephone Encounter (Signed)
Refill- montelukast sodium 10mg  tab. Take one tablet by mouth every day. Qty 90 last fill 1.19.13

## 2011-09-21 NOTE — Telephone Encounter (Signed)
It doesn't look like we have filled this for the pt before. Please advise if ok to refill and # of refills.

## 2011-09-25 ENCOUNTER — Telehealth (INDEPENDENT_AMBULATORY_CARE_PROVIDER_SITE_OTHER): Payer: Self-pay

## 2011-09-25 ENCOUNTER — Telehealth (INDEPENDENT_AMBULATORY_CARE_PROVIDER_SITE_OTHER): Payer: Self-pay | Admitting: General Surgery

## 2011-09-25 NOTE — Telephone Encounter (Signed)
Called pt with lab results.  Faxed same to Dr. Charlynn Court, PCP at [facility].

## 2011-09-25 NOTE — Telephone Encounter (Signed)
Patient calling, had K+ level drawn Thursday by home health and asking for results and whether she needs to stay on her high dose of potassium. Patient also needs a post op appt scheduled.

## 2011-09-26 ENCOUNTER — Ambulatory Visit (INDEPENDENT_AMBULATORY_CARE_PROVIDER_SITE_OTHER): Payer: Medicare Other | Admitting: Internal Medicine

## 2011-09-26 ENCOUNTER — Encounter: Payer: Self-pay | Admitting: Internal Medicine

## 2011-09-26 VITALS — BP 110/74 | HR 98 | Temp 97.8°F | Ht 61.5 in | Wt 124.0 lb

## 2011-09-26 DIAGNOSIS — E876 Hypokalemia: Secondary | ICD-10-CM

## 2011-09-26 DIAGNOSIS — D649 Anemia, unspecified: Secondary | ICD-10-CM

## 2011-09-26 LAB — CBC
HCT: 35.7 % — ABNORMAL LOW (ref 36.0–46.0)
Hemoglobin: 11.4 g/dL — ABNORMAL LOW (ref 12.0–15.0)
MCH: 29.6 pg (ref 26.0–34.0)
MCHC: 31.9 g/dL (ref 30.0–36.0)
MCV: 92.7 fL (ref 78.0–100.0)
Platelets: 470 10*3/uL — ABNORMAL HIGH (ref 150–400)
RBC: 3.85 MIL/uL — ABNORMAL LOW (ref 3.87–5.11)
RDW: 13.9 % (ref 11.5–15.5)
WBC: 7.2 10*3/uL (ref 4.0–10.5)

## 2011-09-26 LAB — BASIC METABOLIC PANEL
BUN: 25 mg/dL — ABNORMAL HIGH (ref 6–23)
CO2: 25 mEq/L (ref 19–32)
Calcium: 9.9 mg/dL (ref 8.4–10.5)
Chloride: 106 mEq/L (ref 96–112)
Creat: 0.68 mg/dL (ref 0.50–1.10)
Glucose, Bld: 107 mg/dL — ABNORMAL HIGH (ref 70–99)
Potassium: 5 mEq/L (ref 3.5–5.3)
Sodium: 141 mEq/L (ref 135–145)

## 2011-09-28 ENCOUNTER — Ambulatory Visit (INDEPENDENT_AMBULATORY_CARE_PROVIDER_SITE_OTHER): Payer: Medicare Other | Admitting: Critical Care Medicine

## 2011-09-28 ENCOUNTER — Encounter: Payer: Self-pay | Admitting: Critical Care Medicine

## 2011-09-28 VITALS — BP 120/76 | HR 94 | Temp 97.8°F | Ht 61.5 in | Wt 123.0 lb

## 2011-09-28 DIAGNOSIS — G589 Mononeuropathy, unspecified: Secondary | ICD-10-CM

## 2011-09-28 DIAGNOSIS — G629 Polyneuropathy, unspecified: Secondary | ICD-10-CM

## 2011-09-28 DIAGNOSIS — J449 Chronic obstructive pulmonary disease, unspecified: Secondary | ICD-10-CM

## 2011-09-28 NOTE — Progress Notes (Signed)
Subjective:    Patient ID: [name], female    DOB: May 18, 1933, 78 y.o.   MRN: [id number]  HPI Comments: I   78 y.o. WF Prior dx copd needs Cspine neck fusion.  Dx 1980 with copd.  Has never been less than 50mg  /d prednisone   07/24/2011 Not seen since 12/2010.  Now needs Colon polyp removal At last OV we assessed/recommended: Golds IV Copd not oxygen dependent but steroid dependent Significant lifelong asthmatic atopic features Large dose of maintenance prednisone required Needs Cspine surgery C3-4-5 Plan Use Dulera two puff twice daily Use nebulizer as needed Stop budesonide in nebulizer No change in prednisone You may be cleared for spinal surgery Pt plans surgery in Jan 2013 d/t insurance reasons  Did not have neck, neurology changed Dx to CIDP,  Peripheral neuropathy.   Neck surgery was put off.  Colon polyp needs to be removed. Dyspnea has been stable.  Takes only two neb meds Rx /day. No real cough.   On pred 65mg /d  Needs laproscopic surgery to remove the colon polyp.  5/2 Pt had cecal adenoma removed Since d/c issues have been: to see urology has neuropathy of bladder.  Pt is self catheterizing and wears diaper.  Pt remains incontinent.   No real cough.  Likes brovana and pulmicort better, could not do dulera.  Past Medical History  Diagnosis Date  . Anemia   . Anxiety     patient denied  . Depression     patient denied  . Asthma   . Chronic fatigue syndrome   . COPD (chronic obstructive pulmonary disease)   . GERD (gastroesophageal reflux disease)   . Hyperlipidemia   . Hypertension   . Colon polyp   . Allergic rhinitis   . Vitamin d deficiency   . Pulmonary embolism   . Pneumonia   . Neuropathy   . Arthritis   . 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 Blood transfusion   . Osteoporosis   . Thyroid disease   . Constipation   . Weakness   . Family history of anesthesia complication     Daughter with history of malignant  hyperthermia  . Malignant hyperthermia     daughter   . Seizures     1990 last seizures on meds Phenobarb  . Skin cancer     squamous cell     Family History  Problem Relation Age of Onset  . Heart disease Mother   . Hyperlipidemia Mother   . Allergies Sister   . Cancer Sister     ovarian  . Ovarian cancer Sister   . Cancer Sister     lung - stage 1  . Thyroid cancer Sister   . Cancer Sister     thyroid - stage 1  . Lung cancer Sister   . Colon cancer Father   . Cancer Father     colon  . Stomach cancer      first cousin  . Hyperlipidemia Maternal Aunt   . Cancer Cousin     stomach     History   Social History  . Marital Status: Married    Spouse Name: N/A    Number of Children: 3  . Years of Education: N/A   Occupational History  . retired    Social History Main Topics  . Smoking status: Former Smoker -- 0.5 packs/day for 3 years    Types: Cigarettes    Quit date: 05/29/1978  . Smokeless tobacco: Never  Used  . Alcohol Use: No  . Drug Use: No  . Sexually Active: Yes   Other Topics Concern  . Not on file   Social History Narrative  . No narrative on file     Allergies  Allergen Reactions  . Iodine Other (See Comments)    Cardiac arrest  . Morphine And Related Other (See Comments)    Cardiac arrest     Outpatient Prescriptions Prior to Visit  Medication Sig Dispense Refill  . albuterol (PROAIR HFA) 108 (90 BASE) MCG/ACT inhaler Inhale 2 puffs into the lungs 2 (two) times daily.       Marland Kitchen amLODipine (NORVASC) 10 MG tablet Take 1 tablet (10 mg total) by mouth daily.  30 tablet  6  . arformoterol (BROVANA) 15 MCG/2ML NEBU Take 2 mLs (15 mcg total) by nebulization 2 (two) times daily.  120 mL  6  . budesonide (PULMICORT) 0.25 MG/2ML nebulizer solution Take 2 mLs (0.25 mg total) by nebulization 2 (two) times daily.  60 mL  6  . CALCIUM-VITAMIN D PO Take 1 tablet by mouth daily.       . cetirizine (ZYRTEC) 10 MG tablet Take 10 mg by mouth daily.          Marland Kitchen DEXILANT 60 MG capsule Take 60 mg by mouth Daily.       . diclofenac sodium (VOLTAREN) 1 % GEL Apply 1 application topically as needed. To gout on fingers and hands      . ergocalciferol (VITAMIN D2) 50000 UNITS capsule Take 50,000 Units by mouth once a week. Takes on Monday      . Insulin Pen Needle (B-D ULTRAFINE III SHORT PEN) 31G X 8 MM MISC Use to give Forteo injection once a day for 28 days.  30 each  5  . lidocaine (LIDODERM) 5 % Place 2 patches onto the skin daily. Remove & Discard patch within 12 hours or as directed by MD  60 patch  6  . mometasone (NASONEX) 50 MCG/ACT nasal spray Place 2 sprays into the nose daily.        . montelukast (SINGULAIR) 10 MG tablet Take 1 tablet (10 mg total) by mouth at bedtime.  90 tablet  3  . ondansetron (ZOFRAN-ODT) 4 MG disintegrating tablet Take 4 mg by mouth every 8 (eight) hours as needed. For nausea      . oxyCODONE-acetaminophen (PERCOCET) 10-325 MG per tablet Take 1 tablet by mouth every 4 (four) hours as needed for pain. For breakthrough pain. Stated she hasn't had to use since starting on Oxycontin 12h  60 tablet  0  . OXYCONTIN 30 MG TB12 Take 30 mg by mouth as needed.       Marland Kitchen PHENobarbital (LUMINAL) 32.4 MG tablet Take 32.4-64.8 mg by mouth 2 (two) times daily. Takes 1 tab at 6 AM, 1 tab at noon, and 2 tabs at 8 PM      . polyethylene glycol (MIRALAX / GLYCOLAX) packet Take 17 g by mouth daily.      . potassium chloride SA (K-DUR,KLOR-CON) 20 MEQ tablet Take 3 tablets (60 mEq total) by mouth 2 (two) times daily.  100 tablet  1  . predniSONE (DELTASONE) 20 MG tablet Take 1 tablet (20 mg total) by mouth daily.      . simvastatin (ZOCOR) 40 MG tablet Take 1 tablet (40 mg total) by mouth at bedtime.  30 tablet  6  . Teriparatide, Recombinant, (FORTEO) 600 MCG/2.4ML SOLN Inject 0.08 mLs (20  mcg total) into the skin daily. Inject subcutaneously once a day for 28 days.  2.4 mL  5  . theophylline (THEODUR) 100 MG 12 hr tablet Take 100 mg by  mouth 2 (two) times daily.       Marland Kitchen dexlansoprazole (DEXILANT) 60 MG capsule Take 1 capsule (60 mg total) by mouth daily.  30 capsule  6  . amphetamine-dextroamphetamine (ADDERALL) 10 MG tablet Take 10 mg by mouth 4 (four) times daily. On hold      . chlorpheniramine (CHLOR-TRIMETON) 4 MG tablet Take 4 mg by mouth daily.      Marland Kitchen estradiol (ESTRACE) 1 MG tablet Take 1 mg by mouth every other day.         Review of Systems  Constitutional: Negative for chills, diaphoresis, activity change, appetite change, fatigue and unexpected weight change.  HENT: Positive for hearing loss, congestion and neck stiffness. Negative for nosebleeds, facial swelling, sneezing, mouth sores, trouble swallowing, dental problem, voice change, postnasal drip, sinus pressure, tinnitus and ear discharge.   Eyes: Negative for photophobia, discharge, itching and visual disturbance.  Respiratory: Positive for cough and chest tightness. Negative for apnea, choking and stridor.   Cardiovascular: Negative for palpitations.  Gastrointestinal: Positive for constipation and blood in stool. Negative for nausea and abdominal distention.       Hx of rectocoele   Genitourinary: Positive for urgency, frequency and decreased urine volume. Negative for dysuria, hematuria, flank pain and difficulty urinating.  Musculoskeletal: Positive for back pain, joint swelling, arthralgias and gait problem. Negative for myalgias.  Skin: Negative for color change and pallor.  Neurological: Positive for weakness and numbness. Negative for dizziness, tremors, seizures, syncope, speech difficulty and light-headedness.  Hematological: Negative for adenopathy. Bruises/bleeds easily.  Psychiatric/Behavioral: Negative for confusion, sleep disturbance and agitation. The patient is not nervous/anxious.        Objective:   Physical Exam  Filed Vitals:   09/28/11 1615  BP: 120/76  Pulse: 94  Temp: 97.8 F (36.6 C)  TempSrc: Oral  Height: 5' 1.5"  (1.562 m)  Weight: 55.792 kg (123 lb)  SpO2: 98%    Gen: Pleasant, well-nourished, in no distress,  normal affect  ENT: No lesions,  mouth clear,  oropharynx clear, no postnasal drip  Neck: No JVD, no TMG, no carotid bruits  Lungs: No use of accessory muscles, no dullness to percussion, distant BS  Cardiovascular: RRR, heart sounds normal, no murmur or gallops, no peripheral edema  Abdomen: soft and NT, no HSM,  BS normal  Musculoskeletal: No deformities, no cyanosis or clubbing  Neuro: alert, non focal  Skin: Warm, no lesions or rashes   8/20 Cleda Daub: FeV1 53%  Fef 25 75 20%  8/20: CXR: NAD, copd changes    Assessment & Plan:   COPD (chronic obstructive pulmonary disease) Copd gold D improved with BD neb meds, successful recent cecal adenoma resection per [facility] Plan Leave pred at 20mg  /d  Stay on theophylline Stay on BD neb meds      Updated Medication List Outpatient Encounter Prescriptions as of 09/28/2011  Medication Sig Dispense Refill  . albuterol (PROAIR HFA) 108 (90 BASE) MCG/ACT inhaler Inhale 2 puffs into the lungs 2 (two) times daily.       Marland Kitchen amLODipine (NORVASC) 10 MG tablet Take 1 tablet (10 mg total) by mouth daily.  30 tablet  6  . arformoterol (BROVANA) 15 MCG/2ML NEBU Take 2 mLs (15 mcg total) by nebulization 2 (two) times daily.  120 mL  6  . budesonide (PULMICORT) 0.25 MG/2ML nebulizer solution Take 2 mLs (0.25 mg total) by nebulization 2 (two) times daily.  60 mL  6  . CALCIUM-VITAMIN D PO Take 1 tablet by mouth daily.       . cetirizine (ZYRTEC) 10 MG tablet Take 10 mg by mouth daily.        Marland Kitchen DEXILANT 60 MG capsule Take 60 mg by mouth Daily.       . diclofenac sodium (VOLTAREN) 1 % GEL Apply 1 application topically as needed. To gout on fingers and hands      . ergocalciferol (VITAMIN D2) 50000 UNITS capsule Take 50,000 Units by mouth once a week. Takes on Monday      . HYDROcodone-homatropine (HYCODAN) 5-1.5 MG/5ML syrup Take by mouth as needed.       . Insulin Pen Needle (B-D ULTRAFINE III SHORT PEN) 31G X 8 MM MISC Use to give Forteo injection once a day for 28 days.  30 each  5  . lidocaine (LIDODERM) 5 % Place 2 patches onto the skin daily. Remove & Discard patch within 12 hours or as directed by MD  60 patch  6  . mometasone (NASONEX) 50 MCG/ACT nasal spray Place 2 sprays into the nose daily.        . montelukast (SINGULAIR) 10 MG tablet Take 1 tablet (10 mg total) by mouth at bedtime.  90 tablet  3  . ondansetron (ZOFRAN-ODT) 4 MG disintegrating tablet Take 4 mg by mouth every 8 (eight) hours as needed. For nausea      . oxyCODONE-acetaminophen (PERCOCET) 10-325 MG per tablet Take 1 tablet by mouth every 4 (four) hours as needed for pain. For breakthrough pain. Stated she hasn't had to use since starting on Oxycontin 12h  60 tablet  0  . OXYCONTIN 30 MG TB12 Take 30 mg by mouth as needed.       Marland Kitchen PHENobarbital (LUMINAL) 32.4 MG tablet Take 32.4-64.8 mg by mouth 2 (two) times daily. Takes 1 tab at 6 AM, 1 tab at noon, and 2 tabs at 8 PM      . polyethylene glycol (MIRALAX / GLYCOLAX) packet Take 17 g by mouth daily.      . potassium chloride SA (K-DUR,KLOR-CON) 20 MEQ tablet Take 3 tablets (60 mEq total) by mouth 2 (two) times daily.  100 tablet  1  . predniSONE (DELTASONE) 20 MG tablet Take 1 tablet (20 mg total) by mouth daily.      . simvastatin (ZOCOR) 40 MG tablet Take 1 tablet (40 mg total) by mouth at bedtime.  30 tablet  6  . Teriparatide, Recombinant, (FORTEO) 600 MCG/2.4ML SOLN Inject 0.08 mLs (20 mcg total) into the skin daily. Inject subcutaneously once a day for 28 days.  2.4 mL  5  . theophylline (THEODUR) 100 MG 12 hr tablet Take 100 mg by mouth 2 (two) times daily.       Marland Kitchen dexlansoprazole (DEXILANT) 60 MG capsule Take 1 capsule (60 mg total) by mouth daily.  30 capsule  6  . DISCONTD: amphetamine-dextroamphetamine (ADDERALL) 10 MG tablet Take 10 mg by mouth 4 (four) times daily. On hold      . DISCONTD:  chlorpheniramine (CHLOR-TRIMETON) 4 MG tablet Take 4 mg by mouth daily.      Marland Kitchen DISCONTD: estradiol (ESTRACE) 1 MG tablet Take 1 mg by mouth every other day.

## 2011-09-28 NOTE — Patient Instructions (Signed)
No change in medications. Return in          2 months [place] 

## 2011-09-29 NOTE — Assessment & Plan Note (Signed)
Copd gold D improved with BD neb meds, successful recent cecal adenoma resection per [facility] Plan Leave pred at 20mg  /d  Stay on theophylline Stay on BD neb meds

## 2011-10-01 DIAGNOSIS — E876 Hypokalemia: Secondary | ICD-10-CM | POA: Insufficient documentation

## 2011-10-01 NOTE — Assessment & Plan Note (Signed)
Noted postoperatively. Obtain cbc

## 2011-10-01 NOTE — Assessment & Plan Note (Signed)
Obtain chem7 

## 2011-10-01 NOTE — Progress Notes (Signed)
Subjective:    Patient ID: [name], female    DOB: Nov 30, 1932, 78 y.o.   MRN: [id number]  HPI Pt presents to clinic for followup of multiple medical problems. S/p surgery since last visit. Post op noted to be mildly anemic and hypokalemic with k 2.5. k supplement increased, most recent k 5.2 and dose decreased. No recent recheck. States pulmonary is following theophylline. Stopped adderall preop and has not resumed. Performing i&o cath at home. Recently stopped caffeine and notes decreased uop.  Past Medical History  Diagnosis Date  . Anemia   . Anxiety     patient denied  . Depression     patient denied  . Asthma   . Chronic fatigue syndrome   . COPD (chronic obstructive pulmonary disease)   . GERD (gastroesophageal reflux disease)   . Hyperlipidemia   . Hypertension   . Colon polyp   . Allergic rhinitis   . Vitamin d deficiency   . Pulmonary embolism   . Pneumonia   . Neuropathy   . Arthritis   . 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 Blood transfusion   . Osteoporosis   . Thyroid disease   . Constipation   . Weakness   . Family history of anesthesia complication     Daughter with history of malignant hyperthermia  . Malignant hyperthermia     daughter   . Seizures     1990 last seizures on meds Phenobarb  . Skin cancer     squamous cell   Past Surgical History  Procedure Date  . Tonsillectomy and adenoidectomy   . Nasal septum surgery   . Dilation and curettage of uterus   . Tubal ligation   . Cesarean section   . Sinus surgeries     x 4  . Left ovary and tube removed   . Vocal polyps removed   . Appendectomy   . Cystocele repair   . Carpal tunnel release     right  . Shoulder arthroscopy     x2 left, 1 right  . Left finger fusion   . Right median nerve decompression   . Duptyren's contracture right hand   . Finger ganglion cyst excision     right  . Abdominal hysterectomy   . Joint replacement     right and left basal  joints of thumbs  . Bladder suspension   . Cataract extraction     bilateral  . Cervical neck ablation     x 7, C3-C4  . Squamous lesions removed     neck and face  . Basal cell carcinoma excision     face  . Panniculectomy     reports that she quit smoking about 33 years ago. Her smoking use included Cigarettes. She has a 1.5 pack-year smoking history. She has never used smokeless tobacco. She reports that she does not drink alcohol or use illicit drugs. family history includes Allergies in her sister; Cancer in her cousin, father, and sisters; Colon cancer in her father; Heart disease in her mother; Hyperlipidemia in her maternal aunt and mother; Lung cancer in her sister; Ovarian cancer in her sister; Stomach cancer in an unspecified family member; and Thyroid cancer in her sister. Allergies  Allergen Reactions  . Iodine Other (See Comments)    Cardiac arrest  . Morphine And Related Other (See Comments)    Cardiac arrest      Review of Systems see hpi     Objective:  Physical Exam  Physical Exam  Nursing note and vitals reviewed. Constitutional: Appears well-developed and well-nourished. No distress.  HENT:  Head: Normocephalic and atraumatic.  Right Ear: External ear normal.  Left Ear: External ear normal.  Eyes: Conjunctivae are normal. No scleral icterus.  Neck: Neck supple. Carotid bruit is not present.  Cardiovascular: Normal rate, regular rhythm and normal heart sounds.  Exam reveals no gallop and no friction rub.   No murmur heard. Pulmonary/Chest: Effort normal and breath sounds normal. No respiratory distress. He has no wheezes. no rales.  Lymphadenopathy:    He has no cervical adenopathy.  Neurological:Alert.  Skin: Skin is warm and dry. Not diaphoretic.  Psychiatric: Has a normal mood and affect.        Assessment & Plan:

## 2011-10-03 ENCOUNTER — Encounter (INDEPENDENT_AMBULATORY_CARE_PROVIDER_SITE_OTHER): Payer: Self-pay | Admitting: General Surgery

## 2011-10-03 ENCOUNTER — Ambulatory Visit (INDEPENDENT_AMBULATORY_CARE_PROVIDER_SITE_OTHER): Payer: Medicare Other | Admitting: General Surgery

## 2011-10-03 DIAGNOSIS — D126 Benign neoplasm of colon, unspecified: Secondary | ICD-10-CM

## 2011-10-03 DIAGNOSIS — R159 Full incontinence of feces: Secondary | ICD-10-CM

## 2011-10-03 HISTORY — DX: Full incontinence of feces: R15.9

## 2011-10-03 NOTE — Patient Instructions (Signed)
Follow up with me as needed.  Get follow up colonoscopy at instruction of GI physician.    See [facility] for anal manometry.

## 2011-10-03 NOTE — Progress Notes (Signed)
HISTORY: No fever/ chills.  Tolerating diet.  Happy with wound.  Having urinary retention still.  Having some fecal incontinence still which predated surgery due to neuropathy of unknown etiology.  Is also having more frequent stool.      EXAM: General:  Alert and oriented.   Incision:  Clean/dry/no erythema   PATHOLOGY: Adenoma, no malignancy.  Pt given copy of pathology.    ASSESSMENT AND PLAN:   Fecal incontinence Order anal manometry at [facility].  Adenoma of cecum Doing well post op.  No cancer seen on pathology.  Colonoscopy in 1 year.  Follow up with me as needed.      Maudry Diego, MD Surgical Oncology, General & Endocrine Surgery [facility]  Estill Cotta, MD, MD Rodena Medin Acie Fredrickson, MD

## 2011-10-03 NOTE — Assessment & Plan Note (Signed)
Doing well post op.  No cancer seen on pathology.  Colonoscopy in 1 year.  Follow up with me as needed.

## 2011-10-03 NOTE — Assessment & Plan Note (Signed)
Order anal manometry at [facility]

## 2011-10-04 ENCOUNTER — Encounter (INDEPENDENT_AMBULATORY_CARE_PROVIDER_SITE_OTHER): Payer: Self-pay

## 2011-10-17 ENCOUNTER — Other Ambulatory Visit: Payer: Self-pay | Admitting: Diagnostic Neuroimaging

## 2011-10-17 MED ORDER — IMMUNE GLOBULIN (HUMAN) 10 GM/100ML IV SOLN: 400.0000 mg/kg | INTRAVENOUS | Status: AC

## 2011-10-17 MED ORDER — DIPHENHYDRAMINE HCL 25 MG PO CAPS: 50.0000 mg | ORAL_CAPSULE | Freq: Every day | ORAL | Status: DC

## 2011-10-17 MED ORDER — ACETAMINOPHEN 325 MG PO TABS: 650.0000 mg | ORAL_TABLET | Freq: Every day | ORAL | Status: DC

## 2011-10-21 ENCOUNTER — Other Ambulatory Visit: Payer: Self-pay | Admitting: Internal Medicine

## 2011-10-25 ENCOUNTER — Other Ambulatory Visit (HOSPITAL_COMMUNITY): Payer: Self-pay | Admitting: *Deleted

## 2011-10-26 NOTE — Telephone Encounter (Signed)
Call placed to patient at [phone], she was informed Rx available for pick up.

## 2011-10-30 ENCOUNTER — Encounter (HOSPITAL_COMMUNITY)
Admission: RE | Admit: 2011-10-30 | Discharge: 2011-10-30 | Disposition: A | Discharge status: Home / Self Care | Payer: Medicare Other | Source: Ambulatory Visit | Attending: Diagnostic Neuroimaging | Admitting: Diagnostic Neuroimaging

## 2011-10-30 ENCOUNTER — Encounter (HOSPITAL_COMMUNITY): Payer: Self-pay

## 2011-10-30 DIAGNOSIS — G6181 Chronic inflammatory demyelinating polyneuritis: Secondary | ICD-10-CM | POA: Insufficient documentation

## 2011-10-30 HISTORY — DX: Neuromuscular dysfunction of bladder, unspecified: N31.9

## 2011-10-30 MED ORDER — IMMUNE GLOBULIN (HUMAN) 10 GM/100ML IV SOLN
0.4000 g/kg | INTRAVENOUS | Status: DC
  Filled 2011-10-30 (×2): qty 200

## 2011-10-30 MED ORDER — ACETAMINOPHEN 325 MG PO TABS
650.0000 mg | ORAL_TABLET | ORAL | Status: DC
  Administered 2011-10-30: 650 mg via ORAL

## 2011-10-30 MED ORDER — DIPHENHYDRAMINE HCL 25 MG PO TABS
50.0000 mg | ORAL_TABLET | ORAL | Status: DC
  Administered 2011-10-30: 50 mg via ORAL
  Filled 2011-10-30 (×2): qty 2

## 2011-10-30 MED ORDER — DIPHENHYDRAMINE HCL 25 MG PO CAPS
ORAL_CAPSULE | ORAL | Status: AC
  Administered 2011-10-30: 50 mg via ORAL
  Filled 2011-10-30: qty 1

## 2011-10-30 MED ORDER — ACETAMINOPHEN 325 MG PO TABS
ORAL_TABLET | ORAL | Status: AC
  Administered 2011-10-30: 650 mg via ORAL
  Filled 2011-10-30: qty 2

## 2011-10-30 MED ORDER — DEXTROSE 5 % IV SOLN
INTRAVENOUS | Status: DC
  Administered 2011-10-30: 11:00:00 via INTRAVENOUS

## 2011-10-30 MED ORDER — DIPHENHYDRAMINE HCL 25 MG PO CAPS
ORAL_CAPSULE | ORAL | Status: AC
  Filled 2011-10-30: qty 2

## 2011-10-30 NOTE — Discharge Instructions (Signed)

## 2011-10-31 ENCOUNTER — Encounter (HOSPITAL_COMMUNITY)
Admission: RE | Admit: 2011-10-31 | Discharge: 2011-10-31 | Disposition: A | Discharge status: Home / Self Care | Payer: Medicare Other | Source: Ambulatory Visit | Attending: Diagnostic Neuroimaging | Admitting: Diagnostic Neuroimaging

## 2011-10-31 ENCOUNTER — Encounter (HOSPITAL_COMMUNITY): Payer: Self-pay

## 2011-10-31 MED ORDER — DIPHENHYDRAMINE HCL 25 MG PO TABS
50.0000 mg | ORAL_TABLET | ORAL | Status: DC
  Filled 2011-10-31 (×2): qty 2

## 2011-10-31 MED ORDER — ACETAMINOPHEN 325 MG PO TABS
ORAL_TABLET | ORAL | Status: AC
  Administered 2011-10-31: 650 mg via ORAL
  Filled 2011-10-31: qty 2

## 2011-10-31 MED ORDER — DIPHENHYDRAMINE HCL 25 MG PO CAPS
ORAL_CAPSULE | ORAL | Status: AC
  Administered 2011-10-31: 50 mg via ORAL
  Filled 2011-10-31: qty 2

## 2011-10-31 MED ORDER — DIPHENHYDRAMINE HCL 25 MG PO CAPS
50.0000 mg | ORAL_CAPSULE | ORAL | Status: DC
  Administered 2011-11-02: 50 mg via ORAL

## 2011-10-31 MED ORDER — IMMUNE GLOBULIN (HUMAN) 10 GM/100ML IV SOLN
0.4000 g/kg | INTRAVENOUS | Status: DC
  Administered 2011-10-31: 25 g via INTRAVENOUS
  Filled 2011-10-31 (×2): qty 250

## 2011-10-31 MED ORDER — DIPHENHYDRAMINE HCL 25 MG PO CAPS
50.0000 mg | ORAL_CAPSULE | Freq: Once | ORAL | Status: AC
  Administered 2011-10-31: 50 mg via ORAL

## 2011-10-31 MED ORDER — DEXTROSE 5 % IV SOLN
INTRAVENOUS | Status: DC
  Administered 2011-10-31: 11:00:00 via INTRAVENOUS

## 2011-10-31 MED ORDER — ACETAMINOPHEN 325 MG PO TABS
650.0000 mg | ORAL_TABLET | ORAL | Status: DC
  Administered 2011-10-31: 650 mg via ORAL

## 2011-10-31 NOTE — Discharge Instructions (Signed)
Call MD For any problems  Such as fever, severe headache or severe aches and pains Pt states she does not want the discharge paperwork

## 2011-10-31 NOTE — Telephone Encounter (Signed)
noted 

## 2011-10-31 NOTE — Telephone Encounter (Signed)
PATIENT HUSBAND PICKED UP RX

## 2011-11-01 ENCOUNTER — Encounter (HOSPITAL_COMMUNITY): Payer: Self-pay

## 2011-11-01 ENCOUNTER — Encounter (HOSPITAL_COMMUNITY)
Admission: RE | Admit: 2011-11-01 | Discharge: 2011-11-01 | Disposition: A | Discharge status: Home / Self Care | Payer: Medicare Other | Source: Ambulatory Visit | Attending: Diagnostic Neuroimaging | Admitting: Diagnostic Neuroimaging

## 2011-11-01 MED ORDER — ACETAMINOPHEN 325 MG PO TABS
650.0000 mg | ORAL_TABLET | ORAL | Status: DC
  Administered 2011-11-01: 650 mg via ORAL

## 2011-11-01 MED ORDER — DIPHENHYDRAMINE HCL 25 MG PO CAPS
ORAL_CAPSULE | ORAL | Status: AC
  Filled 2011-11-01: qty 2

## 2011-11-01 MED ORDER — DEXTROSE 5 % IV SOLN
INTRAVENOUS | Status: DC
  Administered 2011-11-01: 250 mL via INTRAVENOUS

## 2011-11-01 MED ORDER — DIPHENHYDRAMINE HCL 25 MG PO CAPS
50.0000 mg | ORAL_CAPSULE | Freq: Once | ORAL | Status: AC
  Administered 2011-11-01: 50 mg via ORAL

## 2011-11-01 MED ORDER — IMMUNE GLOBULIN (HUMAN) 10 GM/100ML IV SOLN
0.4000 g/kg | INTRAVENOUS | Status: DC
  Administered 2011-11-01: 25 g via INTRAVENOUS
  Filled 2011-11-01 (×2): qty 250

## 2011-11-01 MED ORDER — ACETAMINOPHEN 325 MG PO TABS
ORAL_TABLET | ORAL | Status: AC
  Filled 2011-11-01: qty 2

## 2011-11-01 NOTE — Discharge Instructions (Signed)
Immunoglobulin Electrophoresis This is a blood test that looks for abnormal immunoglobulins, which are proteins within the blood. Abnormal immunoglobulins are associated with various types of diseases. PREPARATION FOR TEST No preparation or fasting is necessary. NORMAL FINDINGS  Immunoglobulin G.   1 month - 250 mg/dL to 213 mg/dL.   2 months to 5 months - 200 mg/dL to 086 mg/dL.   6 months to 9 months - 220 mg/dL to 578 mg/dL.   1 year - 340 mg/dL to 4,696 mg/dL.   2 years to 3 years - 420 mg/dL to 2,952 mg/dL.   4 years to 12 years - 460 mg/dL to 8,413 mg/dL.   Adult - 565 mg/dL to 2,440 mg/dL.   Immunoglobulin A.   1 month - 1 mg/dL to 4 mg/dL.   2 months to 5 months - 4 mg/dL to 80 mg/dL.   6 months to 9 months - 8 mg/dL to 80 mg/dL.   1 year - 15 mg/dL to 102 mg/dL.   2 years to 3 years - 18 mg/dL to 725 mg/dL.   4 years to 12 years - 25 mg/dL to 366 mg/dL.   Adult - 85 mg/dL to 440 mg/dL.   Immunoglobulin M.   1 month - 20 mg/dL to 80 mg/dL.   2 months to 5 months - 25 mg/dL to 347 mg/dL.   6 months to 9 months - 35 mg/dL to 425 mg/dL.   1 year to 8 years 45 mg/dL to 956 mg/dL.   9 years to 12 years - 50 mg/dL to 387 mg/dL.   Immunoglobulin D and E - Minimal.  Ranges for normal findings may vary among different laboratories and hospitals. You should always check with your doctor after having lab work or other tests done to discuss the meaning of your test results and whether your values are considered within normal limits. MEANING OF TEST  Your caregiver will go over the test results with you and discuss the importance and meaning of your results, as well as treatment options and the need for additional tests if necessary. OBTAINING THE TEST RESULTS It is your responsibility to obtain your test results. Ask the lab or department performing the test when and how you will get your results. Document Released: 06/17/2004 Document Revised: 05/04/2011 Document  Reviewed: 04/25/2008 [facility] Patient Information 2012 [place], [place].

## 2011-11-02 ENCOUNTER — Encounter (HOSPITAL_COMMUNITY)
Admission: RE | Admit: 2011-11-02 | Discharge: 2011-11-02 | Disposition: A | Discharge status: Home / Self Care | Payer: Medicare Other | Source: Ambulatory Visit | Attending: Diagnostic Neuroimaging | Admitting: Diagnostic Neuroimaging

## 2011-11-02 ENCOUNTER — Encounter: Payer: Self-pay | Admitting: Internal Medicine

## 2011-11-02 ENCOUNTER — Encounter (HOSPITAL_COMMUNITY): Payer: Self-pay

## 2011-11-02 ENCOUNTER — Ambulatory Visit: Payer: Medicare Other | Admitting: Internal Medicine

## 2011-11-02 ENCOUNTER — Ambulatory Visit (INDEPENDENT_AMBULATORY_CARE_PROVIDER_SITE_OTHER): Payer: Medicare Other | Admitting: Internal Medicine

## 2011-11-02 VITALS — BP 118/70 | HR 99 | Temp 98.5°F | Resp 18 | Ht 61.5 in | Wt 131.0 lb

## 2011-11-02 DIAGNOSIS — E785 Hyperlipidemia, unspecified: Secondary | ICD-10-CM

## 2011-11-02 DIAGNOSIS — R569 Unspecified convulsions: Secondary | ICD-10-CM

## 2011-11-02 DIAGNOSIS — I10 Essential (primary) hypertension: Secondary | ICD-10-CM

## 2011-11-02 DIAGNOSIS — Z79899 Other long term (current) drug therapy: Secondary | ICD-10-CM

## 2011-11-02 DIAGNOSIS — N319 Neuromuscular dysfunction of bladder, unspecified: Secondary | ICD-10-CM

## 2011-11-02 MED ORDER — ACETAMINOPHEN 325 MG PO TABS
ORAL_TABLET | ORAL | Status: AC
  Filled 2011-11-02: qty 2

## 2011-11-02 MED ORDER — AMPHETAMINE-DEXTROAMPHETAMINE 10 MG PO TABS: 10.0000 mg | ORAL_TABLET | Freq: Four times a day (QID) | ORAL | Status: DC

## 2011-11-02 MED ORDER — DIPHENHYDRAMINE HCL 25 MG PO CAPS
ORAL_CAPSULE | ORAL | Status: AC
  Filled 2011-11-02: qty 2

## 2011-11-02 MED ORDER — ACETAMINOPHEN 325 MG PO TABS
650.0000 mg | ORAL_TABLET | ORAL | Status: DC
  Administered 2011-11-02: 650 mg via ORAL

## 2011-11-02 MED ORDER — DIPHENHYDRAMINE HCL 25 MG PO CAPS: 50.0000 mg | ORAL_CAPSULE | Freq: Once | ORAL | Status: DC

## 2011-11-02 MED ORDER — DEXTROSE 5 % IV SOLN
INTRAVENOUS | Status: DC
  Administered 2011-11-02: 250 mL via INTRAVENOUS

## 2011-11-02 MED ORDER — IMMUNE GLOBULIN (HUMAN) 10 GM/100ML IV SOLN
0.4000 g/kg | INTRAVENOUS | Status: DC
  Administered 2011-11-02: 25 g via INTRAVENOUS
  Filled 2011-11-02 (×2): qty 250

## 2011-11-02 NOTE — Discharge Instructions (Signed)

## 2011-11-02 NOTE — Patient Instructions (Signed)
Please schedule fasting labs prior to next visit Lipid/lft 272.4, depakote level, theophylline level, chem7-v58.69

## 2011-11-03 ENCOUNTER — Encounter (HOSPITAL_COMMUNITY): Payer: Self-pay

## 2011-11-03 ENCOUNTER — Encounter (HOSPITAL_COMMUNITY)
Admission: RE | Admit: 2011-11-03 | Discharge: 2011-11-03 | Disposition: A | Discharge status: Home / Self Care | Payer: Medicare Other | Source: Ambulatory Visit | Attending: Diagnostic Neuroimaging | Admitting: Diagnostic Neuroimaging

## 2011-11-03 MED ORDER — DEXTROSE 5 % IV SOLN
Freq: Every day | INTRAVENOUS | Status: DC
  Administered 2011-11-03: 11:00:00 via INTRAVENOUS

## 2011-11-03 MED ORDER — ACETAMINOPHEN 325 MG PO TABS
ORAL_TABLET | ORAL | Status: AC
  Administered 2011-11-03: 650 mg via ORAL
  Filled 2011-11-03: qty 2

## 2011-11-03 MED ORDER — IMMUNE GLOBULIN (HUMAN) 1 GM/10ML IV SOLN
25.0000 g | INTRAVENOUS | Status: DC
  Administered 2011-11-03: 25 g via INTRAVENOUS
  Filled 2011-11-03 (×2): qty 250

## 2011-11-03 MED ORDER — ACETAMINOPHEN 325 MG PO TABS
650.0000 mg | ORAL_TABLET | Freq: Every day | ORAL | Status: DC
  Administered 2011-11-03: 650 mg via ORAL

## 2011-11-03 NOTE — Discharge Instructions (Signed)

## 2011-11-05 DIAGNOSIS — N319 Neuromuscular dysfunction of bladder, unspecified: Secondary | ICD-10-CM | POA: Insufficient documentation

## 2011-11-05 NOTE — Progress Notes (Signed)
Subjective:    Patient ID: [name], female    DOB: 07/13/1932, 78 y.o.   MRN: [id number]  HPI Pt presents to clinic for followup of multiple medical problems. S/p colon polyp surgery. Continues with neurogenic bladder felt to be secondary to CIDP still receiving IvIG. Wishes to resume adderall. Has mild bilateral feet swelling worse later in the day.  Past Medical History  Diagnosis Date  . Anemia   . Anxiety     patient denied  . Depression     patient denied  . Asthma   . Chronic fatigue syndrome   . COPD (chronic obstructive pulmonary disease)   . GERD (gastroesophageal reflux disease)   . Hyperlipidemia   . Hypertension   . Colon polyp   . Allergic rhinitis   . Vitamin d deficiency   . Pulmonary embolism   . Pneumonia   . Neuropathy   . Arthritis   . 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 Blood transfusion   . Osteoporosis   . Thyroid disease   . Constipation   . Weakness   . Family history of anesthesia complication     Daughter with history of malignant hyperthermia  . Malignant hyperthermia     daughter   . Seizures     1990 last seizures on meds Phenobarb  . Skin cancer     squamous cell  . Neurogenic bladder 2013    husband caths pt   Past Surgical History  Procedure Date  . Tonsillectomy and adenoidectomy   . Nasal septum surgery   . Dilation and curettage of uterus   . Tubal ligation   . Cesarean section   . Sinus surgeries     x 4  . Left ovary and tube removed   . Vocal polyps removed   . Appendectomy   . Cystocele repair   . Carpal tunnel release     right  . Shoulder arthroscopy     x2 left, 1 right  . Left finger fusion   . Right median nerve decompression   . Duptyren's contracture right hand   . Finger ganglion cyst excision     right  . Abdominal hysterectomy   . Joint replacement     right and left basal joints of thumbs  . Bladder suspension   . Cataract extraction     bilateral  . Cervical neck  ablation     x 7, C3-C4  . Squamous lesions removed     neck and face  . Basal cell carcinoma excision     face  . Panniculectomy     reports that she quit smoking about 33 years ago. Her smoking use included Cigarettes. She has a 1.5 pack-year smoking history. She has never used smokeless tobacco. She reports that she does not drink alcohol or use illicit drugs. family history includes Allergies in her sister; Cancer in her cousin, father, and sisters; Colon cancer in her father; Heart disease in her mother; Hyperlipidemia in her maternal aunt and mother; Lung cancer in her sister; Ovarian cancer in her sister; Stomach cancer in an unspecified family member; and Thyroid cancer in her sister. Allergies  Allergen Reactions  . Iodine Other (See Comments)    Cardiac arrest  . Morphine And Related Other (See Comments)    Cardiac arrest      Review of Systems see hpi     Objective:   Physical Exam  Nursing note and vitals reviewed. Constitutional: She appears well-developed  and well-nourished. No distress.  HENT:  Head: Normocephalic and atraumatic.  Cardiovascular: Normal rate, regular rhythm and normal heart sounds.   Pulmonary/Chest: Effort normal and breath sounds normal.  Neurological: She is alert.  Skin: She is not diaphoretic.  Psychiatric: She has a normal mood and affect.          Assessment & Plan:

## 2011-11-05 NOTE — Assessment & Plan Note (Signed)
Normotensive and stable. Continue current regimen. Monitor bp as outpt and followup in clinic as scheduled.  

## 2011-11-05 NOTE — Assessment & Plan Note (Signed)
Stable. Obtain lipid/lft prior to next visit. 

## 2011-11-09 ENCOUNTER — Other Ambulatory Visit (HOSPITAL_COMMUNITY): Payer: Self-pay | Admitting: Urology

## 2011-11-09 DIAGNOSIS — N281 Cyst of kidney, acquired: Secondary | ICD-10-CM

## 2011-11-20 LAB — PULMONARY FUNCTION TEST

## 2011-11-22 ENCOUNTER — Encounter: Payer: Self-pay | Admitting: Internal Medicine

## 2011-11-27 ENCOUNTER — Encounter (HOSPITAL_COMMUNITY)
Admission: RE | Admit: 2011-11-27 | Discharge: 2011-11-27 | Disposition: A | Discharge status: Home / Self Care | Payer: Medicare Other | Source: Ambulatory Visit | Attending: Diagnostic Neuroimaging | Admitting: Diagnostic Neuroimaging

## 2011-11-27 ENCOUNTER — Encounter (HOSPITAL_COMMUNITY): Payer: Self-pay

## 2011-11-27 ENCOUNTER — Encounter: Payer: Self-pay | Admitting: Internal Medicine

## 2011-11-27 DIAGNOSIS — G6181 Chronic inflammatory demyelinating polyneuritis: Secondary | ICD-10-CM | POA: Insufficient documentation

## 2011-11-27 MED ORDER — IMMUNE GLOBULIN (HUMAN) 10 GM/100ML IV SOLN
25.0000 g | Freq: Once | INTRAVENOUS | Status: AC
  Administered 2011-11-27: 25 g via INTRAVENOUS
  Filled 2011-11-27: qty 250

## 2011-11-27 MED ORDER — DEXTROSE 5 % IV SOLN
Freq: Every day | INTRAVENOUS | Status: DC
  Administered 2011-11-27: 250 mL via INTRAVENOUS

## 2011-11-27 MED ORDER — ACETAMINOPHEN 325 MG PO TABS
650.0000 mg | ORAL_TABLET | Freq: Every day | ORAL | Status: DC
  Administered 2011-11-27: 650 mg via ORAL

## 2011-11-27 MED ORDER — DIPHENHYDRAMINE HCL 25 MG PO CAPS: 50.0000 mg | ORAL_CAPSULE | ORAL | Status: DC

## 2011-11-27 MED ORDER — DIPHENHYDRAMINE HCL 25 MG PO CAPS
ORAL_CAPSULE | ORAL | Status: AC
  Administered 2011-11-27: 50 mg
  Filled 2011-11-27: qty 2

## 2011-11-27 MED ORDER — ACETAMINOPHEN 325 MG PO TABS
ORAL_TABLET | ORAL | Status: AC
  Filled 2011-11-27: qty 2

## 2011-11-27 MED ORDER — DIPHENHYDRAMINE HCL 25 MG PO TABS
50.0000 mg | ORAL_TABLET | ORAL | Status: DC
Start: 2011-11-27 — End: 2011-11-27

## 2011-11-27 MED ORDER — IMMUNE GLOBULIN (HUMAN) 1 GM/10ML IV SOLN
25.0000 g | INTRAVENOUS | Status: DC
Start: 2011-11-27 — End: 2011-11-27

## 2011-11-27 NOTE — Progress Notes (Signed)
C/o pain 5 of 10, and was 10 of 10 last night, pain is in rt neck and sates this is new pain ,developed in last 2 weeks.  Call placed to dr Marjory Lies.   Per Andrey Campanile, RN,  OK to proceed as ordered

## 2011-11-27 NOTE — Discharge Instructions (Signed)

## 2011-11-28 ENCOUNTER — Encounter (HOSPITAL_COMMUNITY): Payer: Self-pay

## 2011-11-28 ENCOUNTER — Encounter (HOSPITAL_COMMUNITY)
Admission: RE | Admit: 2011-11-28 | Discharge: 2011-11-28 | Disposition: A | Discharge status: Home / Self Care | Payer: Medicare Other | Source: Ambulatory Visit | Attending: Diagnostic Neuroimaging | Admitting: Diagnostic Neuroimaging

## 2011-11-28 MED ORDER — IMMUNE GLOBULIN (HUMAN) 10 GM/100ML IV SOLN
25.0000 g | Freq: Once | INTRAVENOUS | Status: AC
  Administered 2011-11-28: 25 g via INTRAVENOUS
  Filled 2011-11-28: qty 250

## 2011-11-28 MED ORDER — ACETAMINOPHEN 325 MG PO TABS
650.0000 mg | ORAL_TABLET | Freq: Every day | ORAL | Status: DC
  Administered 2011-11-28: 650 mg via ORAL

## 2011-11-28 MED ORDER — DIPHENHYDRAMINE HCL 25 MG PO CAPS
ORAL_CAPSULE | ORAL | Status: AC
  Administered 2011-11-28: 50 mg via ORAL
  Filled 2011-11-28: qty 2

## 2011-11-28 MED ORDER — DEXTROSE 5 % IV SOLN
Freq: Every day | INTRAVENOUS | Status: DC
  Administered 2011-11-28: 11:00:00 via INTRAVENOUS

## 2011-11-28 MED ORDER — DIPHENHYDRAMINE HCL 25 MG PO CAPS
50.0000 mg | ORAL_CAPSULE | ORAL | Status: DC
  Administered 2011-11-28: 50 mg via ORAL

## 2011-11-28 MED ORDER — ACETAMINOPHEN 325 MG PO TABS
ORAL_TABLET | ORAL | Status: AC
  Administered 2011-11-28: 650 mg via ORAL
  Filled 2011-11-28: qty 2

## 2011-11-29 ENCOUNTER — Encounter (HOSPITAL_COMMUNITY)
Admission: RE | Admit: 2011-11-29 | Discharge: 2011-11-29 | Disposition: A | Discharge status: Home / Self Care | Payer: Medicare Other | Source: Ambulatory Visit | Attending: Diagnostic Neuroimaging | Admitting: Diagnostic Neuroimaging

## 2011-11-29 ENCOUNTER — Encounter (HOSPITAL_COMMUNITY): Payer: Self-pay

## 2011-11-29 MED ORDER — ACETAMINOPHEN 325 MG PO TABS
650.0000 mg | ORAL_TABLET | Freq: Every day | ORAL | Status: DC
  Administered 2011-11-29: 650 mg via ORAL

## 2011-11-29 MED ORDER — DIPHENHYDRAMINE HCL 25 MG PO CAPS
50.0000 mg | ORAL_CAPSULE | ORAL | Status: DC
  Administered 2011-11-29: 50 mg via ORAL

## 2011-11-29 MED ORDER — DEXTROSE 5 % IV SOLN: Freq: Every day | INTRAVENOUS | Status: DC

## 2011-11-29 MED ORDER — ACETAMINOPHEN 325 MG PO TABS
ORAL_TABLET | ORAL | Status: AC
  Administered 2011-11-29: 650 mg via ORAL
  Filled 2011-11-29: qty 2

## 2011-11-29 MED ORDER — IMMUNE GLOBULIN (HUMAN) 10 GM/100ML IV SOLN
25.0000 g | Freq: Once | INTRAVENOUS | Status: DC
  Filled 2011-11-29: qty 250

## 2011-11-29 MED ORDER — DIPHENHYDRAMINE HCL 25 MG PO CAPS
ORAL_CAPSULE | ORAL | Status: AC
  Administered 2011-11-29: 50 mg via ORAL
  Filled 2011-11-29: qty 2

## 2011-11-29 NOTE — Discharge Instructions (Signed)

## 2011-11-30 ENCOUNTER — Observation Stay (HOSPITAL_COMMUNITY)
Admission: AD | Admit: 2011-11-30 | Discharge: 2011-11-30 | Disposition: A | Discharge status: Home / Self Care | Payer: Medicare Other | Source: Ambulatory Visit | Attending: Diagnostic Neuroimaging | Admitting: Diagnostic Neuroimaging

## 2011-11-30 ENCOUNTER — Encounter (HOSPITAL_COMMUNITY)
Admission: RE | Admit: 2011-11-30 | Discharge: 2011-11-30 | Disposition: A | Discharge status: Home / Self Care | Payer: Medicare Other | Source: Ambulatory Visit | Attending: Diagnostic Neuroimaging | Admitting: Diagnostic Neuroimaging

## 2011-11-30 MED ORDER — IMMUNE GLOBULIN (HUMAN) 10 GM/100ML IV SOLN
25.0000 g | Freq: Once | INTRAVENOUS | Status: DC
  Filled 2011-11-30: qty 250

## 2011-11-30 MED ORDER — DEXTROSE 5 % IV SOLN: Freq: Every day | INTRAVENOUS | Status: DC

## 2011-11-30 MED ORDER — DIPHENHYDRAMINE HCL 50 MG PO CAPS
50.0000 mg | ORAL_CAPSULE | Freq: Once | ORAL | Status: DC
  Filled 2011-11-30: qty 1

## 2011-11-30 MED ORDER — ACETAMINOPHEN 325 MG PO TABS
650.0000 mg | ORAL_TABLET | Freq: Every day | ORAL | Status: DC
  Filled 2011-11-30: qty 2

## 2011-12-01 ENCOUNTER — Encounter (HOSPITAL_COMMUNITY)
Admission: RE | Admit: 2011-12-01 | Discharge: 2011-12-01 | Disposition: A | Discharge status: Home / Self Care | Payer: Medicare Other | Source: Ambulatory Visit | Attending: Diagnostic Neuroimaging | Admitting: Diagnostic Neuroimaging

## 2011-12-01 ENCOUNTER — Encounter (HOSPITAL_COMMUNITY): Payer: Self-pay

## 2011-12-01 MED ORDER — ACETAMINOPHEN 325 MG PO TABS
650.0000 mg | ORAL_TABLET | Freq: Once | ORAL | Status: AC
  Administered 2011-12-01: 650 mg via ORAL

## 2011-12-01 MED ORDER — DIPHENHYDRAMINE HCL 25 MG PO CAPS
ORAL_CAPSULE | ORAL | Status: AC
  Administered 2011-12-01: 50 mg via ORAL
  Filled 2011-12-01: qty 2

## 2011-12-01 MED ORDER — IMMUNE GLOBULIN (HUMAN) 10 GM/100ML IV SOLN
25.0000 g | Freq: Once | INTRAVENOUS | Status: AC
  Administered 2011-12-01: 25 g via INTRAVENOUS
  Filled 2011-12-01: qty 250

## 2011-12-01 MED ORDER — ACETAMINOPHEN 325 MG PO TABS
ORAL_TABLET | ORAL | Status: AC
  Administered 2011-12-01: 650 mg via ORAL
  Filled 2011-12-01: qty 2

## 2011-12-01 MED ORDER — DIPHENHYDRAMINE HCL 25 MG PO CAPS
50.0000 mg | ORAL_CAPSULE | Freq: Once | ORAL | Status: AC
  Administered 2011-12-01: 50 mg via ORAL

## 2011-12-01 MED ORDER — DEXTROSE 5 % IV SOLN
Freq: Every day | INTRAVENOUS | Status: AC
  Administered 2011-12-01: 11:00:00 via INTRAVENOUS

## 2011-12-12 ENCOUNTER — Ambulatory Visit (HOSPITAL_COMMUNITY)
Admission: RE | Admit: 2011-12-12 | Discharge: 2011-12-12 | Disposition: A | Discharge status: Home / Self Care | Payer: Medicare Other | Source: Ambulatory Visit | Attending: Urology | Admitting: Urology

## 2011-12-12 DIAGNOSIS — N281 Cyst of kidney, acquired: Secondary | ICD-10-CM | POA: Insufficient documentation

## 2011-12-12 DIAGNOSIS — M545 Low back pain, unspecified: Secondary | ICD-10-CM | POA: Insufficient documentation

## 2011-12-12 DIAGNOSIS — K838 Other specified diseases of biliary tract: Secondary | ICD-10-CM | POA: Insufficient documentation

## 2011-12-12 LAB — CREATININE, SERUM
Creatinine, Ser: 0.64 mg/dL (ref 0.50–1.10)
GFR calc Af Amer: >90 mL/min (ref >90)
GFR calc non Af Amer: 84 mL/min — ABNORMAL LOW (ref >90)

## 2011-12-12 MED ORDER — GADOBENATE DIMEGLUMINE 529 MG/ML IV SOLN
11.0000 mL | Freq: Once | INTRAVENOUS | Status: AC | PRN
  Administered 2011-12-12: 11 mL via INTRAVENOUS

## 2011-12-15 ENCOUNTER — Encounter (INDEPENDENT_AMBULATORY_CARE_PROVIDER_SITE_OTHER): Payer: Self-pay

## 2012-01-01 ENCOUNTER — Encounter (HOSPITAL_COMMUNITY)
Admission: RE | Admit: 2012-01-01 | Discharge: 2012-01-01 | Disposition: A | Discharge status: Home / Self Care | Payer: Medicare Other | Source: Ambulatory Visit | Attending: Diagnostic Neuroimaging | Admitting: Diagnostic Neuroimaging

## 2012-01-01 ENCOUNTER — Encounter (HOSPITAL_COMMUNITY): Payer: Self-pay

## 2012-01-01 DIAGNOSIS — G6181 Chronic inflammatory demyelinating polyneuritis: Secondary | ICD-10-CM | POA: Insufficient documentation

## 2012-01-01 MED ORDER — DEXTROSE 5 % IV SOLN
INTRAVENOUS | Status: DC
  Administered 2012-01-01: 250 mL via INTRAVENOUS

## 2012-01-01 MED ORDER — IMMUNE GLOBULIN (HUMAN) 10 GM/100ML IV SOLN: 1.0000 g/kg | INTRAVENOUS | Status: DC

## 2012-01-01 MED ORDER — IMMUNE GLOBULIN (HUMAN) 10 GM/100ML IV SOLN
0.4000 g/kg | INTRAVENOUS | Status: DC
  Administered 2012-01-01: 25 g via INTRAVENOUS
  Filled 2012-01-01 (×2): qty 250

## 2012-01-01 MED ORDER — ACETAMINOPHEN 325 MG PO TABS
650.0000 mg | ORAL_TABLET | ORAL | Status: DC
  Administered 2012-01-01: 650 mg via ORAL
  Filled 2012-01-01: qty 2

## 2012-01-01 MED ORDER — IMMUNE GLOBULIN (HUMAN) 10 GM/200ML IV SOLN
1.0000 g/kg | INTRAVENOUS | Status: DC
  Filled 2012-01-01 (×2): qty 1200

## 2012-01-01 MED ORDER — DIPHENHYDRAMINE HCL 25 MG PO CAPS
50.0000 mg | ORAL_CAPSULE | ORAL | Status: DC
  Administered 2012-01-01: 50 mg via ORAL
  Filled 2012-01-01: qty 2

## 2012-01-02 ENCOUNTER — Encounter: Payer: Self-pay | Admitting: Internal Medicine

## 2012-01-02 ENCOUNTER — Ambulatory Visit (AMBULATORY_SURGERY_CENTER): Payer: Medicare Other

## 2012-01-02 ENCOUNTER — Encounter (HOSPITAL_COMMUNITY)
Admission: RE | Admit: 2012-01-02 | Discharge: 2012-01-02 | Disposition: A | Discharge status: Home / Self Care | Payer: Medicare Other | Source: Ambulatory Visit | Attending: Diagnostic Neuroimaging | Admitting: Diagnostic Neuroimaging

## 2012-01-02 ENCOUNTER — Encounter (HOSPITAL_COMMUNITY): Payer: Self-pay

## 2012-01-02 VITALS — Ht 62.0 in | Wt 127.8 lb

## 2012-01-02 DIAGNOSIS — D131 Benign neoplasm of stomach: Secondary | ICD-10-CM

## 2012-01-02 DIAGNOSIS — K317 Polyp of stomach and duodenum: Secondary | ICD-10-CM

## 2012-01-02 MED ORDER — ACETAMINOPHEN 325 MG PO TABS
650.0000 mg | ORAL_TABLET | ORAL | Status: DC
  Administered 2012-01-02: 650 mg via ORAL
  Filled 2012-01-02: qty 2

## 2012-01-02 MED ORDER — DEXTROSE 5 % IV SOLN
INTRAVENOUS | Status: DC
  Administered 2012-01-02: 250 mL via INTRAVENOUS

## 2012-01-02 MED ORDER — DIPHENHYDRAMINE HCL 25 MG PO CAPS
50.0000 mg | ORAL_CAPSULE | ORAL | Status: DC
  Administered 2012-01-02: 50 mg via ORAL
  Filled 2012-01-02: qty 2

## 2012-01-02 MED ORDER — IMMUNE GLOBULIN (HUMAN) 10 GM/100ML IV SOLN
0.4000 g/kg | INTRAVENOUS | Status: DC
  Administered 2012-01-02: 25 g via INTRAVENOUS
  Filled 2012-01-02 (×2): qty 250

## 2012-01-03 ENCOUNTER — Encounter (HOSPITAL_COMMUNITY)
Admission: RE | Admit: 2012-01-03 | Discharge: 2012-01-03 | Disposition: A | Discharge status: Home / Self Care | Payer: Medicare Other | Source: Ambulatory Visit | Attending: Diagnostic Neuroimaging | Admitting: Diagnostic Neuroimaging

## 2012-01-03 ENCOUNTER — Encounter (HOSPITAL_COMMUNITY): Payer: Self-pay

## 2012-01-03 MED ORDER — ACETAMINOPHEN 325 MG PO TABS: 650.0000 mg | ORAL_TABLET | Freq: Once | ORAL | Status: DC

## 2012-01-03 MED ORDER — IMMUNE GLOBULIN (HUMAN) 10 GM/100ML IV SOLN
25.0000 g | Freq: Once | INTRAVENOUS | Status: AC
  Administered 2012-01-04: 25 g via INTRAVENOUS
  Filled 2012-01-03: qty 250

## 2012-01-03 MED ORDER — IMMUNE GLOBULIN (HUMAN) 10 GM/100ML IV SOLN
0.4000 g/kg | INTRAVENOUS | Status: DC
  Filled 2012-01-03 (×3): qty 250

## 2012-01-03 MED ORDER — ACETAMINOPHEN 325 MG PO TABS
650.0000 mg | ORAL_TABLET | Freq: Every day | ORAL | Status: DC
  Administered 2012-01-03: 650 mg via ORAL
  Filled 2012-01-03: qty 1

## 2012-01-03 MED ORDER — IMMUNE GLOBULIN (HUMAN) 10 GM/100ML IV SOLN
25.0000 g | Freq: Every day | INTRAVENOUS | Status: DC
  Administered 2012-01-03: 25 g via INTRAVENOUS
  Filled 2012-01-03 (×2): qty 250

## 2012-01-03 MED ORDER — DIPHENHYDRAMINE HCL 25 MG PO CAPS: 50.0000 mg | ORAL_CAPSULE | ORAL | Status: DC

## 2012-01-03 MED ORDER — DIPHENHYDRAMINE HCL 25 MG PO CAPS: 50.0000 mg | ORAL_CAPSULE | Freq: Once | ORAL | Status: DC

## 2012-01-03 MED ORDER — DEXTROSE 5 % IV SOLN
INTRAVENOUS | Status: DC
  Administered 2012-01-03: 11:00:00 via INTRAVENOUS

## 2012-01-03 MED ORDER — DEXTROSE 5 % IV SOLN: INTRAVENOUS | Status: DC

## 2012-01-03 MED ORDER — DIPHENHYDRAMINE HCL 25 MG PO CAPS
50.0000 mg | ORAL_CAPSULE | Freq: Every day | ORAL | Status: DC
Start: 2012-01-03 — End: 2012-01-04
  Administered 2012-01-03: 50 mg via ORAL
  Filled 2012-01-03: qty 2

## 2012-01-03 MED ORDER — ACETAMINOPHEN 325 MG PO TABS
650.0000 mg | ORAL_TABLET | ORAL | Status: DC
  Administered 2012-01-04: 650 mg via ORAL

## 2012-01-04 ENCOUNTER — Encounter (HOSPITAL_COMMUNITY): Payer: Self-pay

## 2012-01-04 ENCOUNTER — Encounter (HOSPITAL_COMMUNITY)
Admission: RE | Admit: 2012-01-04 | Discharge: 2012-01-04 | Disposition: A | Discharge status: Home / Self Care | Payer: Medicare Other | Source: Ambulatory Visit | Attending: Diagnostic Neuroimaging | Admitting: Diagnostic Neuroimaging

## 2012-01-04 MED ORDER — DIPHENHYDRAMINE HCL 25 MG PO CAPS
50.0000 mg | ORAL_CAPSULE | Freq: Every day | ORAL | Status: DC
  Administered 2012-01-04: 50 mg via ORAL
  Filled 2012-01-04: qty 2

## 2012-01-04 MED ORDER — ACETAMINOPHEN 325 MG PO TABS
650.0000 mg | ORAL_TABLET | Freq: Every day | ORAL | Status: DC
  Filled 2012-01-04: qty 2

## 2012-01-04 MED ORDER — IMMUNE GLOBULIN (HUMAN) 10 GM/100ML IV SOLN
25.0000 g | Freq: Every day | INTRAVENOUS | Status: DC
  Administered 2012-01-04: 25 g via INTRAVENOUS
  Filled 2012-01-04 (×2): qty 250

## 2012-01-04 MED ORDER — DEXTROSE 5 % IV SOLN: Freq: Every day | INTRAVENOUS | Status: DC

## 2012-01-05 ENCOUNTER — Encounter (HOSPITAL_COMMUNITY): Payer: Self-pay

## 2012-01-05 ENCOUNTER — Encounter (HOSPITAL_COMMUNITY)
Admission: RE | Admit: 2012-01-05 | Discharge: 2012-01-05 | Disposition: A | Discharge status: Home / Self Care | Payer: Medicare Other | Source: Ambulatory Visit | Attending: Diagnostic Neuroimaging | Admitting: Diagnostic Neuroimaging

## 2012-01-05 LAB — HEPATIC FUNCTION PANEL
ALT: 9 U/L (ref 0–35)
AST: 21 U/L (ref 0–37)
Albumin: 3.6 g/dL (ref 3.5–5.2)
Alkaline Phosphatase: 87 U/L (ref 39–117)
Bilirubin, Direct: <0.1 mg/dL (ref 0.0–0.3)
Total Bilirubin: 0.2 mg/dL — ABNORMAL LOW (ref 0.3–1.2)
Total Protein: 8.6 g/dL — ABNORMAL HIGH (ref 6.0–8.3)

## 2012-01-05 LAB — BASIC METABOLIC PANEL
BUN: 32 mg/dL — ABNORMAL HIGH (ref 6–23)
CO2: 25 mEq/L (ref 19–32)
Calcium: 9.4 mg/dL (ref 8.4–10.5)
Chloride: 105 mEq/L (ref 96–112)
Creat: 0.78 mg/dL (ref 0.50–1.10)
Glucose, Bld: 78 mg/dL (ref 70–99)
Potassium: 4.1 mEq/L (ref 3.5–5.3)
Sodium: 137 mEq/L (ref 135–145)

## 2012-01-05 LAB — LIPID PANEL
Cholesterol: 157 mg/dL (ref 0–200)
HDL: 45 mg/dL (ref >39)
LDL Cholesterol: 95 mg/dL (ref 0–99)
Total CHOL/HDL Ratio: 3.5 Ratio
Triglycerides: 87 mg/dL (ref <150)
VLDL: 17 mg/dL (ref 0–40)

## 2012-01-05 MED ORDER — DIPHENHYDRAMINE HCL 25 MG PO CAPS
50.0000 mg | ORAL_CAPSULE | Freq: Every day | ORAL | Status: AC
  Administered 2012-01-05: 50 mg via ORAL
  Filled 2012-01-05: qty 2

## 2012-01-05 MED ORDER — DEXTROSE 5 % IV SOLN
INTRAVENOUS | Status: AC
  Administered 2012-01-05: 11:00:00 via INTRAVENOUS

## 2012-01-05 MED ORDER — ACETAMINOPHEN 325 MG PO TABS
650.0000 mg | ORAL_TABLET | Freq: Every day | ORAL | Status: AC
  Administered 2012-01-05: 650 mg via ORAL
  Filled 2012-01-05: qty 2

## 2012-01-05 MED ORDER — IMMUNE GLOBULIN (HUMAN) 10 GM/100ML IV SOLN
25.0000 g | Freq: Every day | INTRAVENOUS | Status: AC
  Administered 2012-01-05: 25 g via INTRAVENOUS
  Filled 2012-01-05: qty 250

## 2012-01-05 NOTE — Addendum Note (Signed)
Addended by: Mervin Kung A on: 01/05/2012 09:56 AM   Modules accepted: Orders

## 2012-01-06 LAB — VALPROIC ACID LEVEL: Valproic Acid Lvl: <1 ug/mL — ABNORMAL LOW (ref 50.0–100.0)

## 2012-01-06 LAB — THEOPHYLLINE LEVEL: Theophylline Lvl: 1.7 ug/mL — ABNORMAL LOW (ref 10.0–20.0)

## 2012-01-08 ENCOUNTER — Telehealth: Payer: Self-pay | Admitting: Internal Medicine

## 2012-01-08 MED ORDER — SIMVASTATIN 40 MG PO TABS: 40.0000 mg | ORAL_TABLET | Freq: Every day | ORAL | Status: DC

## 2012-01-08 NOTE — Telephone Encounter (Signed)
Refill- simvastatin 40mg  tablet. Take one tablet daily at bedtime. Qty 30 last fill 7.13.13

## 2012-01-08 NOTE — Telephone Encounter (Signed)
Rx sent 

## 2012-01-11 ENCOUNTER — Ambulatory Visit (INDEPENDENT_AMBULATORY_CARE_PROVIDER_SITE_OTHER): Payer: Medicare Other | Admitting: Internal Medicine

## 2012-01-11 ENCOUNTER — Encounter: Payer: Self-pay | Admitting: Internal Medicine

## 2012-01-11 VITALS — BP 126/78 | HR 83 | Temp 97.9°F | Resp 14 | Wt 128.0 lb

## 2012-01-11 DIAGNOSIS — R5383 Other fatigue: Secondary | ICD-10-CM

## 2012-01-11 DIAGNOSIS — E8809 Other disorders of plasma-protein metabolism, not elsewhere classified: Secondary | ICD-10-CM

## 2012-01-11 DIAGNOSIS — R779 Abnormality of plasma protein, unspecified: Secondary | ICD-10-CM

## 2012-01-11 DIAGNOSIS — R5381 Other malaise: Secondary | ICD-10-CM

## 2012-01-11 DIAGNOSIS — R569 Unspecified convulsions: Secondary | ICD-10-CM

## 2012-01-11 DIAGNOSIS — Z79899 Other long term (current) drug therapy: Secondary | ICD-10-CM

## 2012-01-11 DIAGNOSIS — J45909 Unspecified asthma, uncomplicated: Secondary | ICD-10-CM

## 2012-01-11 LAB — CBC WITH DIFFERENTIAL/PLATELET
Basophils Absolute: 0.1 10*3/uL (ref 0.0–0.1)
Basophils Relative: 1 % (ref 0–1)
Eosinophils Absolute: 0.3 10*3/uL (ref 0.0–0.7)
Eosinophils Relative: 6 % — ABNORMAL HIGH (ref 0–5)
HCT: 30 % — ABNORMAL LOW (ref 36.0–46.0)
Hemoglobin: 9.7 g/dL — ABNORMAL LOW (ref 12.0–15.0)
Lymphocytes Relative: 52 % — ABNORMAL HIGH (ref 12–46)
Lymphs Abs: 2.6 10*3/uL (ref 0.7–4.0)
MCH: 23.4 pg — ABNORMAL LOW (ref 26.0–34.0)
MCHC: 32.3 g/dL (ref 30.0–36.0)
MCV: 72.3 fL — ABNORMAL LOW (ref 78.0–100.0)
Monocytes Absolute: 0.4 10*3/uL (ref 0.1–1.0)
Monocytes Relative: 9 % (ref 3–12)
Neutro Abs: 1.6 10*3/uL — ABNORMAL LOW (ref 1.7–7.7)
Neutrophils Relative %: 32 % — ABNORMAL LOW (ref 43–77)
Platelets: 332 10*3/uL (ref 150–400)
RBC: 4.15 MIL/uL (ref 3.87–5.11)
RDW: 15.4 % (ref 11.5–15.5)
WBC: 4.9 10*3/uL (ref 4.0–10.5)

## 2012-01-11 LAB — CORTISOL: Cortisol, Plasma: 3.3 ug/dL

## 2012-01-11 LAB — TSH: TSH: 2.686 u[IU]/mL (ref 0.350–4.500)

## 2012-01-11 MED ORDER — AMPHETAMINE-DEXTROAMPHETAMINE 10 MG PO TABS: 10.0000 mg | ORAL_TABLET | Freq: Four times a day (QID) | ORAL | Status: DC

## 2012-01-11 NOTE — Patient Instructions (Signed)
Please schedule theophylline level in ~ 2weeks

## 2012-01-12 LAB — PHENOBARBITAL LEVEL: Phenobarbital: 25.9 ug/mL (ref 15.0–40.0)

## 2012-01-14 DIAGNOSIS — R5383 Other fatigue: Secondary | ICD-10-CM | POA: Insufficient documentation

## 2012-01-14 NOTE — Progress Notes (Signed)
Subjective:    Patient ID: [name], female    DOB: 1932-06-28, 78 y.o.   MRN: [id number]  HPI Pt presents to clinic for followup of multiple medical problems. Being tx'ed with IVIg infusions for CIDP. Notes walking better but bladder fxn no better. Notes increasing fatigue despite resuming adderall. H/o abnormal cortisol test in the past. Total time of visit ~25 mins of which >50% spent in counseling.   Past Medical History  Diagnosis Date  . Anemia   . Anxiety     patient denied  . Depression     patient denied  . Asthma   . Chronic fatigue syndrome   . COPD (chronic obstructive pulmonary disease)   . GERD (gastroesophageal reflux disease)   . Hyperlipidemia   . Hypertension   . Colon polyp   . Allergic rhinitis   . Vitamin d deficiency   . Pulmonary embolism   . Pneumonia   . Neuropathy   . Arthritis   . 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 Blood transfusion   . Osteoporosis   . Thyroid disease   . Constipation   . Weakness   . Family history of anesthesia complication     Daughter with history of malignant hyperthermia  . Malignant hyperthermia     daughter   . Seizures     1990 last seizures on meds Phenobarb  . Skin cancer     squamous cell  . Neurogenic bladder 2013    husband caths pt   Past Surgical History  Procedure Date  . Tonsillectomy and adenoidectomy   . Nasal septum surgery   . Dilation and curettage of uterus   . Tubal ligation   . Cesarean section   . Sinus surgeries     x 4  . Left ovary and tube removed   . Vocal polyps removed   . Appendectomy   . Cystocele repair   . Carpal tunnel release     right  . Shoulder arthroscopy     x2 left, 1 right  . Left finger fusion   . Right median nerve decompression   . Duptyren's contracture right hand   . Finger ganglion cyst excision     right  . Abdominal hysterectomy   . Joint replacement     right and left basal joints of thumbs  . Bladder suspension   .  Cataract extraction     bilateral  . Cervical neck ablation     x 7, C3-C4  . Squamous lesions removed     neck and face  . Basal cell carcinoma excision     face  . Panniculectomy     reports that she quit smoking about 33 years ago. Her smoking use included Cigarettes. She has a 1.5 pack-year smoking history. She has never used smokeless tobacco. She reports that she does not drink alcohol or use illicit drugs. family history includes Allergies in her sister; Cancer in her cousin, father, and sisters; Colon cancer in her father; Heart disease in her mother; Hyperlipidemia in her maternal aunt and mother; Lung cancer in her sister; Ovarian cancer in her sister; Stomach cancer in an unspecified family member; and Thyroid cancer in her sister. Allergies  Allergen Reactions  . Iodine Other (See Comments)    Cardiac arrest  . Morphine And Related Other (See Comments)    Cardiac arrest      Review of Systems see hpi     Objective:   Physical Exam  Nursing note and vitals reviewed. Constitutional: She appears well-developed and well-nourished. No distress.  HENT:  Head: Normocephalic and atraumatic.  Neurological: She is alert.  Skin: She is not diaphoretic.  Psychiatric: She has a normal mood and affect.          Assessment & Plan:

## 2012-01-14 NOTE — Assessment & Plan Note (Signed)
Obtain cbc, tsh, spep (h/o elevated TP) and cortisol.

## 2012-01-14 NOTE — Assessment & Plan Note (Signed)
Increase theophylline 200mg  bid and repeat theophylline level 2wks

## 2012-01-14 NOTE — Assessment & Plan Note (Signed)
Obtain phenobarbital level

## 2012-01-15 LAB — PROTEIN ELECTROPHORESIS, SERUM
Albumin ELP: 45 % — ABNORMAL LOW (ref 55.8–66.1)
Alpha-1-Globulin: 3.5 % (ref 2.9–4.9)
Alpha-2-Globulin: 8.9 % (ref 7.1–11.8)
Beta 2: 3.9 % (ref 3.2–6.5)
Beta Globulin: 5.5 % (ref 4.7–7.2)
Gamma Globulin: 33.2 % — ABNORMAL HIGH (ref 11.1–18.8)
Total Protein, Serum Electrophoresis: 8.8 g/dL — ABNORMAL HIGH (ref 6.0–8.3)

## 2012-01-16 ENCOUNTER — Encounter: Payer: Medicare Other | Admitting: Internal Medicine

## 2012-01-16 ENCOUNTER — Other Ambulatory Visit: Payer: Self-pay | Admitting: Internal Medicine

## 2012-01-16 ENCOUNTER — Encounter: Payer: Self-pay | Admitting: Internal Medicine

## 2012-01-16 ENCOUNTER — Ambulatory Visit (AMBULATORY_SURGERY_CENTER): Payer: Medicare Other | Admitting: Internal Medicine

## 2012-01-16 VITALS — BP 163/81 | HR 94 | Temp 97.9°F | Resp 18 | Ht 62.0 in | Wt 127.0 lb

## 2012-01-16 DIAGNOSIS — Z8719 Personal history of other diseases of the digestive system: Secondary | ICD-10-CM

## 2012-01-16 DIAGNOSIS — D131 Benign neoplasm of stomach: Secondary | ICD-10-CM

## 2012-01-16 MED ORDER — SODIUM CHLORIDE 0.9 % IV SOLN: 500.0000 mL | INTRAVENOUS | Status: DC

## 2012-01-16 NOTE — Op Note (Signed)
[facility] [address] [place], [address]   ENDOSCOPY PROCEDURE REPORT  PATIENT: Julia, [name]  MR#: [id number] BIRTHDATE: 1932-09-04 , 79  yrs. old GENDER: Female ENDOSCOPIST: Beverley Fiedler, MD REFERRED BY: PROCEDURE DATE:  01/16/2012 PROCEDURE:  EGD, diagnostic ASA CLASS:     Class III INDICATIONS:  follow-up of benign gastric tumor, previously removed Jan 2013. MEDICATIONS: propofol (Diprivan)  80 mg IV, MAC TOPICAL ANESTHETIC: none  DESCRIPTION OF PROCEDURE: After the risks benefits and alternatives of the procedure were thoroughly explained, informed consent was obtained.  The LB GIF-H180 [id number] endoscope was introduced through the mouth and advanced to the second portion of the duodenum , limited by Without limitations. The instrument was slowly withdrawn as the mucosa was fully examined.   ESOPHAGUS: The mucosa of the esophagus appeared normal.   GE junction is patent.  STOMACH: Food residue was found in the gastric fundus, which obscured views minimally in this area.   There was mild antral gastropathy noted.   The stomach otherwise appeared normal.   There was no evidence of residual gastric body polyp.  DUODENUM: The duodenal mucosa showed no abnormalities in the bulb and second portion of the duodenum.   Food residue was found in the duodenal bulb.  Retroflexed views revealed findings as previously discussed .     The scope was then withdrawn from the patient and the procedure completed.  COMPLICATIONS: There were no complications. ENDOSCOPIC IMPRESSION: 1.   The mucosa of the esophagus appeared normal 2.   Food residue in the gastric fundus 3.   There was mild antral gastropathy noted [T2] 4.   The stomach otherwise appeared normal 5.   The duodenal mucosa showed no abnormalities in the bulb and second portion of the duodenum 6.   Food residue was found in the duodenal bulb  RECOMMENDATIONS: 1. continue current medications 2.  No further EGD necessary unless symptoms warrant. _______________________________ eSignedBeverley Fiedler, MD 01/16/2012 8:55 AM re  XB:JYNWGN, Maisie Fus MD The Patient  PATIENT NAME:  Julia, [name] MR#: [id number]

## 2012-01-16 NOTE — Patient Instructions (Addendum)
Discharge instructions given with verbal understanding. Normal exam. Resume previous medications. YOU HAD AN ENDOSCOPIC PROCEDURE TODAY AT THE [facility]: Refer to the procedure report that was given to you for any specific questions about what was found during the examination.  If the procedure report does not answer your questions, please call your gastroenterologist to clarify.  If you requested that your care partner not be given the details of your procedure findings, then the procedure report has been included in a sealed envelope for you to review at your convenience later.  YOU SHOULD EXPECT: Some feelings of bloating in the abdomen. Passage of more gas than usual.  Walking can help get rid of the air that was put into your GI tract during the procedure and reduce the bloating. If you had a lower endoscopy (such as a colonoscopy or flexible sigmoidoscopy) you may notice spotting of blood in your stool or on the toilet paper. If you underwent a bowel prep for your procedure, then you may not have a normal bowel movement for a few days.  DIET: Your first meal following the procedure should be a light meal and then it is ok to progress to your normal diet.  A half-sandwich or bowl of soup is an example of a good first meal.  Heavy or fried foods are harder to digest and may make you feel nauseous or bloated.  Likewise meals heavy in dairy and vegetables can cause extra gas to form and this can also increase the bloating.  Drink plenty of fluids but you should avoid alcoholic beverages for 24 hours.  ACTIVITY: Your care partner should take you home directly after the procedure.  You should plan to take it easy, moving slowly for the rest of the day.  You can resume normal activity the day after the procedure however you should NOT DRIVE or use heavy machinery for 24 hours (because of the sedation medicines used during the test).    SYMPTOMS TO REPORT IMMEDIATELY: A gastroenterologist  can be reached at any hour.  During normal business hours, 8:30 AM to 5:00 PM Monday through Friday, call [phone].  After hours and on weekends, please call the GI answering service at [phone] who will take a message and have the physician on call contact you.   Following upper endoscopy (EGD)  Vomiting of blood or coffee ground material  New chest pain or pain under the shoulder blades  Painful or persistently difficult swallowing  New shortness of breath  Fever of 100F or higher  Black, tarry-looking stools  FOLLOW UP: If any biopsies were taken you will be contacted by phone or by letter within the next 1-3 weeks.  Call your gastroenterologist if you have not heard about the biopsies in 3 weeks.  Our staff will call the home number listed on your records the next business day following your procedure to check on you and address any questions or concerns that you may have at that time regarding the information given to you following your procedure. This is a courtesy call and so if there is no answer at the home number and we have not heard from you through the emergency physician on call, we will assume that you have returned to your regular daily activities without incident.  SIGNATURES/CONFIDENTIALITY: You and/or your care partner have signed paperwork which will be entered into your electronic medical record.  These signatures attest to the fact that that the information above on your After   Visit Summary has been reviewed and is understood.  Full responsibility of the confidentiality of this discharge information lies with you and/or your care-partner. 

## 2012-01-16 NOTE — Progress Notes (Signed)
Patient did not experience any of the following events: a burn prior to discharge; a fall within the facility; wrong site/side/patient/procedure/implant event; or a hospital transfer or hospital admission upon discharge from the facility. (G8907) Patient did not have preoperative order for IV antibiotic SSI prophylaxis. (G8918)  

## 2012-01-17 ENCOUNTER — Encounter: Payer: Self-pay | Admitting: Internal Medicine

## 2012-01-17 ENCOUNTER — Telehealth: Payer: Self-pay | Admitting: *Deleted

## 2012-01-17 ENCOUNTER — Other Ambulatory Visit: Payer: Self-pay | Admitting: Critical Care Medicine

## 2012-01-17 ENCOUNTER — Other Ambulatory Visit: Payer: Self-pay | Admitting: Diagnostic Neuroimaging

## 2012-01-17 MED ORDER — DIPHENHYDRAMINE HCL 25 MG PO CAPS: 50.0000 mg | ORAL_CAPSULE | Freq: Every day | ORAL | Status: DC

## 2012-01-17 MED ORDER — ACETAMINOPHEN ER 650 MG PO TBCR: 650.0000 mg | EXTENDED_RELEASE_TABLET | Freq: Every day | ORAL | Status: DC

## 2012-01-17 MED ORDER — IMMUNE GLOBULIN (HUMAN) 10 GM/100ML IV SOLN: 200.0000 mg/kg | INTRAVENOUS | Status: AC

## 2012-01-17 MED ORDER — BUDESONIDE 0.25 MG/2ML IN SUSP: 0.2500 mg | Freq: Two times a day (BID) | RESPIRATORY_TRACT | Status: DC

## 2012-01-17 NOTE — Telephone Encounter (Signed)
Faxed refill request received for Budesinide 0.25mg /86ml neb, use one vial with nebulizer two times a day. Patient last seen 09/28/11. Refill sent in.

## 2012-01-17 NOTE — Telephone Encounter (Signed)
  Follow up Call-  Call back number 01/16/2012 06/12/2011  Post procedure Call Back phone  # [phone] leave message  Permission to leave phone message Yes -     Patient questions:  Do you have a fever, pain , or abdominal swelling? no Pain Score  0 *  Have you tolerated food without any problems? yes  Have you been able to return to your normal activities? yes  Do you have any questions about your discharge instructions: Diet   no Medications  no Follow up visit  no  Do you have questions or concerns about your Care? no  Actions: * If pain score is 4 or above: No action needed, pain <4.

## 2012-01-19 ENCOUNTER — Encounter: Payer: Self-pay | Admitting: Internal Medicine

## 2012-01-19 ENCOUNTER — Ambulatory Visit (INDEPENDENT_AMBULATORY_CARE_PROVIDER_SITE_OTHER): Payer: Medicare Other | Admitting: Internal Medicine

## 2012-01-19 VITALS — BP 118/64 | HR 80 | Temp 98.3°F | Resp 16 | Wt 126.5 lb

## 2012-01-19 DIAGNOSIS — R252 Cramp and spasm: Secondary | ICD-10-CM

## 2012-01-19 DIAGNOSIS — Z79899 Other long term (current) drug therapy: Secondary | ICD-10-CM

## 2012-01-19 DIAGNOSIS — D649 Anemia, unspecified: Secondary | ICD-10-CM

## 2012-01-19 LAB — CBC WITH DIFFERENTIAL/PLATELET
Basophils Absolute: 0.1 10*3/uL (ref 0.0–0.1)
Basophils Relative: 1 % (ref 0–1)
Eosinophils Absolute: 0.3 10*3/uL (ref 0.0–0.7)
Eosinophils Relative: 7 % — ABNORMAL HIGH (ref 0–5)
HCT: 28.6 % — ABNORMAL LOW (ref 36.0–46.0)
Hemoglobin: 9.2 g/dL — ABNORMAL LOW (ref 12.0–15.0)
Lymphocytes Relative: 59 % — ABNORMAL HIGH (ref 12–46)
Lymphs Abs: 2.5 10*3/uL (ref 0.7–4.0)
MCH: 22.7 pg — ABNORMAL LOW (ref 26.0–34.0)
MCHC: 32.2 g/dL (ref 30.0–36.0)
MCV: 70.6 fL — ABNORMAL LOW (ref 78.0–100.0)
Monocytes Absolute: 0.4 10*3/uL (ref 0.1–1.0)
Monocytes Relative: 10 % (ref 3–12)
Neutro Abs: 1 10*3/uL — ABNORMAL LOW (ref 1.7–7.7)
Neutrophils Relative %: 23 % — ABNORMAL LOW (ref 43–77)
Platelets: 341 10*3/uL (ref 150–400)
RBC: 4.05 MIL/uL (ref 3.87–5.11)
RDW: 15.4 % (ref 11.5–15.5)
WBC: 4.3 10*3/uL (ref 4.0–10.5)

## 2012-01-19 LAB — BASIC METABOLIC PANEL
BUN: 17 mg/dL (ref 6–23)
CO2: 24 mEq/L (ref 19–32)
Calcium: 9.3 mg/dL (ref 8.4–10.5)
Chloride: 103 mEq/L (ref 96–112)
Creat: 0.74 mg/dL (ref 0.50–1.10)
Glucose, Bld: 77 mg/dL (ref 70–99)
Potassium: 4.9 mEq/L (ref 3.5–5.3)
Sodium: 136 mEq/L (ref 135–145)

## 2012-01-19 LAB — MAGNESIUM: Magnesium: 2 mg/dL (ref 1.5–2.5)

## 2012-01-19 LAB — VITAMIN B12: Vitamin B-12: 295 pg/mL (ref 211–911)

## 2012-01-19 LAB — RETICULOCYTES
ABS Retic: 32.4 10*3/uL (ref 19.0–186.0)
RBC.: 4.05 MIL/uL (ref 3.87–5.11)
Retic Ct Pct: 0.8 % (ref 0.4–2.3)

## 2012-01-19 LAB — FERRITIN: Ferritin: 10 ng/mL (ref 10–291)

## 2012-01-19 NOTE — Assessment & Plan Note (Signed)
Obtain k, mg. Attempt mag ox 1-2 po qd. Followup if no improvement or worsening.

## 2012-01-19 NOTE — Progress Notes (Signed)
Subjective:    Patient ID: [name], female    DOB: 1932-06-28, 78 y.o.   MRN: [id number]  HPI Pt presents to clinic for evaluation of muscle cramping. Notes severe intermittent bilateral LE cramps. Took extra potassium dose with improvement. No obvious triggers. Reviewed anemia with recent hgb of 9.7 down from 11.4. States had recent EGD without obvious bleeding source. +fatigue.   Past Medical History  Diagnosis Date  . Anemia   . Anxiety     patient denied  . Depression     patient denied  . Asthma   . Chronic fatigue syndrome   . COPD (chronic obstructive pulmonary disease)   . GERD (gastroesophageal reflux disease)   . Hyperlipidemia   . Hypertension   . Colon polyp   . Allergic rhinitis   . Vitamin d deficiency   . Pulmonary embolism   . Pneumonia   . Neuropathy   . Arthritis   . 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 Blood transfusion   . Osteoporosis   . Thyroid disease   . Constipation   . Weakness   . Family history of anesthesia complication     Daughter with history of malignant hyperthermia  . Malignant hyperthermia     daughter   . Seizures     1990 last seizures on meds Phenobarb  . Skin cancer     squamous cell  . Neurogenic bladder 2013    husband caths pt   Past Surgical History  Procedure Date  . Tonsillectomy and adenoidectomy   . Nasal septum surgery   . Dilation and curettage of uterus   . Tubal ligation   . Cesarean section   . Sinus surgeries     x 4  . Left ovary and tube removed   . Vocal polyps removed   . Appendectomy   . Cystocele repair   . Carpal tunnel release     right  . Shoulder arthroscopy     x2 left, 1 right  . Left finger fusion   . Right median nerve decompression   . Duptyren's contracture right hand   . Finger ganglion cyst excision     right  . Abdominal hysterectomy   . Joint replacement     right and left basal joints of thumbs  . Bladder suspension   . Cataract extraction       bilateral  . Cervical neck ablation     x 7, C3-C4  . Squamous lesions removed     neck and face  . Basal cell carcinoma excision     face  . Panniculectomy     reports that she quit smoking about 33 years ago. Her smoking use included Cigarettes. She has a 1.5 pack-year smoking history. She has never used smokeless tobacco. She reports that she does not drink alcohol or use illicit drugs. family history includes Allergies in her sister; Cancer in her cousin, father, and sisters; Colon cancer in her father; Heart disease in her mother; Hyperlipidemia in her maternal aunt and mother; Lung cancer in her sister; Ovarian cancer in her sister; Stomach cancer in an unspecified family member; and Thyroid cancer in her sister. Allergies  Allergen Reactions  . Iodine Other (See Comments)    Cardiac arrest  . Morphine And Related Other (See Comments)    Cardiac arrest     Review of Systems see hpi     Objective:   Physical Exam  Nursing note and vitals reviewed. Constitutional:  She appears well-developed and well-nourished. No distress.  HENT:  Head: Normocephalic and atraumatic.  Neurological: She is alert.  Skin: She is not diaphoretic.  Psychiatric: She has a normal mood and affect.          Assessment & Plan:

## 2012-01-19 NOTE — Assessment & Plan Note (Signed)
No evidence of gross active bleeding. Obtain repeat cbc with ferritin, b12 and retic count.

## 2012-01-19 NOTE — Patient Instructions (Addendum)
Please attempt magnesium oxide 1-2 a day for muscle cramps

## 2012-01-24 ENCOUNTER — Other Ambulatory Visit: Payer: Self-pay | Admitting: Diagnostic Neuroimaging

## 2012-01-24 NOTE — Telephone Encounter (Signed)
Most recent labs forwarded via Fax [phone] to Dr. Marjory Lies @ [facility] Neu[facility]/SLS

## 2012-01-25 ENCOUNTER — Telehealth: Payer: Self-pay | Admitting: Internal Medicine

## 2012-01-25 ENCOUNTER — Telehealth: Payer: Self-pay | Admitting: *Deleted

## 2012-01-25 DIAGNOSIS — R569 Unspecified convulsions: Secondary | ICD-10-CM

## 2012-01-25 MED ORDER — CYCLOBENZAPRINE HCL 5 MG PO TABS: 5.0000 mg | ORAL_TABLET | Freq: Every day | ORAL | Status: DC

## 2012-01-25 NOTE — Telephone Encounter (Signed)
Temporary night time muscle relaxer if not already taking(don't see on med list). Flexeril 5mg  po qhs #20

## 2012-01-25 NOTE — Telephone Encounter (Signed)
Patient states that she is still having agonizing leg cramps. Leg cramps only happen at night. The cramps are from the groin area to her ankles. Patient states that she is losing sleep over this. She would like to know what Dr. Rodena Medin recommends her to do?

## 2012-01-25 NOTE — Telephone Encounter (Signed)
Pt presented to the lab for phenobarbital level per 01/11/12 office note. Order entered and given to the lab.

## 2012-01-25 NOTE — Telephone Encounter (Signed)
Rx to pharmacy; patient informed, understood & agreed/SLS

## 2012-01-26 ENCOUNTER — Telehealth: Payer: Self-pay | Admitting: *Deleted

## 2012-01-26 DIAGNOSIS — D649 Anemia, unspecified: Secondary | ICD-10-CM

## 2012-01-26 LAB — PHENOBARBITAL LEVEL: Phenobarbital: 28.9 ug/mL (ref 15.0–40.0)

## 2012-01-26 NOTE — Telephone Encounter (Signed)
Message copied by Regis Bill on Fri Jan 26, 2012  4:40 PM ------      Message from: Edwyna Perfect      Created: Fri Jan 26, 2012  3:21 PM       Remains anemic without improvement. Is utd with gi studies. Iron is low. Recommend ferrous sulfate 325mg  tid. Repeat cbc and ferritin (anemia) in 3-4wks

## 2012-01-26 NOTE — Telephone Encounter (Signed)
LMOM with contact name & number for pt call back RE: results & further MD instructions/SLS

## 2012-02-01 MED ORDER — FERROUS SULFATE 325 (65 FE) MG PO TABS: 325.0000 mg | ORAL_TABLET | Freq: Three times a day (TID) | ORAL | Status: DC

## 2012-02-01 NOTE — Telephone Encounter (Signed)
Patient informed; understood & agreed, New Rx & Lab orders in EMR/SLS Results mailed to Neurology at pt's request/SLS

## 2012-02-02 ENCOUNTER — Other Ambulatory Visit (HOSPITAL_COMMUNITY): Payer: Self-pay | Admitting: *Deleted

## 2012-02-05 ENCOUNTER — Ambulatory Visit (INDEPENDENT_AMBULATORY_CARE_PROVIDER_SITE_OTHER): Payer: Medicare Other | Admitting: Internal Medicine

## 2012-02-05 ENCOUNTER — Encounter: Payer: Self-pay | Admitting: Internal Medicine

## 2012-02-05 ENCOUNTER — Encounter (HOSPITAL_COMMUNITY): Payer: Self-pay

## 2012-02-05 ENCOUNTER — Encounter (HOSPITAL_COMMUNITY)
Admission: RE | Admit: 2012-02-05 | Discharge: 2012-02-05 | Disposition: A | Discharge status: Home / Self Care | Payer: Medicare Other | Source: Ambulatory Visit | Attending: Diagnostic Neuroimaging | Admitting: Diagnostic Neuroimaging

## 2012-02-05 ENCOUNTER — Ambulatory Visit (HOSPITAL_BASED_OUTPATIENT_CLINIC_OR_DEPARTMENT_OTHER)
Admission: RE | Admit: 2012-02-05 | Discharge: 2012-02-05 | Disposition: A | Discharge status: Home / Self Care | Payer: Medicare Other | Source: Ambulatory Visit | Attending: Internal Medicine | Admitting: Internal Medicine

## 2012-02-05 VITALS — BP 116/68 | HR 90 | Temp 97.8°F | Resp 16 | Wt 127.8 lb

## 2012-02-05 DIAGNOSIS — R0602 Shortness of breath: Secondary | ICD-10-CM | POA: Insufficient documentation

## 2012-02-05 DIAGNOSIS — R079 Chest pain, unspecified: Secondary | ICD-10-CM | POA: Insufficient documentation

## 2012-02-05 DIAGNOSIS — D649 Anemia, unspecified: Secondary | ICD-10-CM

## 2012-02-05 DIAGNOSIS — Z79899 Other long term (current) drug therapy: Secondary | ICD-10-CM

## 2012-02-05 DIAGNOSIS — J449 Chronic obstructive pulmonary disease, unspecified: Secondary | ICD-10-CM

## 2012-02-05 DIAGNOSIS — R0609 Other forms of dyspnea: Secondary | ICD-10-CM

## 2012-02-05 DIAGNOSIS — G6181 Chronic inflammatory demyelinating polyneuritis: Secondary | ICD-10-CM | POA: Insufficient documentation

## 2012-02-05 DIAGNOSIS — F17200 Nicotine dependence, unspecified, uncomplicated: Secondary | ICD-10-CM | POA: Insufficient documentation

## 2012-02-05 DIAGNOSIS — R05 Cough: Secondary | ICD-10-CM | POA: Insufficient documentation

## 2012-02-05 DIAGNOSIS — R0600 Dyspnea, unspecified: Secondary | ICD-10-CM

## 2012-02-05 DIAGNOSIS — R0989 Other specified symptoms and signs involving the circulatory and respiratory systems: Secondary | ICD-10-CM

## 2012-02-05 DIAGNOSIS — R059 Cough, unspecified: Secondary | ICD-10-CM | POA: Insufficient documentation

## 2012-02-05 LAB — CBC WITH DIFFERENTIAL/PLATELET
Basophils Absolute: 0 K/uL (ref 0.0–0.1)
Basophils Relative: 1 % (ref 0–1)
Eosinophils Absolute: 0.3 K/uL (ref 0.0–0.7)
Eosinophils Relative: 4 % (ref 0–5)
HCT: 30.9 % — ABNORMAL LOW (ref 36.0–46.0)
Hemoglobin: 9.4 g/dL — ABNORMAL LOW (ref 12.0–15.0)
Lymphocytes Relative: 40 % (ref 12–46)
Lymphs Abs: 2.5 K/uL (ref 0.7–4.0)
MCH: 22.5 pg — ABNORMAL LOW (ref 26.0–34.0)
MCHC: 30.4 g/dL (ref 30.0–36.0)
MCV: 73.9 fL — ABNORMAL LOW (ref 78.0–100.0)
Monocytes Absolute: 0.4 K/uL (ref 0.1–1.0)
Monocytes Relative: 7 % (ref 3–12)
Neutro Abs: 3 K/uL (ref 1.7–7.7)
Neutrophils Relative %: 49 % (ref 43–77)
Platelets: 328 K/uL (ref 150–400)
RBC: 4.18 MIL/uL (ref 3.87–5.11)
RDW: 16.8 % — ABNORMAL HIGH (ref 11.5–15.5)
WBC: 6.1 K/uL (ref 4.0–10.5)

## 2012-02-05 LAB — THEOPHYLLINE LEVEL: Theophylline Lvl: 2.8 ug/mL — ABNORMAL LOW (ref 10.0–20.0)

## 2012-02-05 LAB — FERRITIN: Ferritin: 19 ng/mL (ref 10–291)

## 2012-02-05 NOTE — Progress Notes (Signed)
Pt c/o dyspnea and increasing shortness of breath c any exertion/ greater last 2 weeks and states this is new problem call placed to dr Marjory Lies  Per MD,  Cancel this weeks treatment and pt instructed to see dr hodgin.  Per sandy, md will let  Will send note to hodgin   Then penumalli will let short stay know if ok to proceed c treatment next week

## 2012-02-06 ENCOUNTER — Telehealth: Payer: Self-pay | Admitting: *Deleted

## 2012-02-06 ENCOUNTER — Encounter: Payer: Self-pay | Admitting: Internal Medicine

## 2012-02-06 ENCOUNTER — Encounter (HOSPITAL_COMMUNITY): Payer: Medicare Other

## 2012-02-06 MED ORDER — DOXYCYCLINE HYCLATE 100 MG PO TABS: 100.0000 mg | ORAL_TABLET | Freq: Two times a day (BID) | ORAL | Status: AC

## 2012-02-06 MED ORDER — PREDNISONE 20 MG PO TABS: 20.0000 mg | ORAL_TABLET | Freq: Every day | ORAL | Status: DC

## 2012-02-06 NOTE — Telephone Encounter (Signed)
LMOM with contact name & number RE: results requested & further MD instructions plus new medications [temporary]; informed Mrs. Kinn that I will call her back in the morning to talk with her further about this matter/SLS

## 2012-02-06 NOTE — Telephone Encounter (Signed)
Message copied by Regis Bill on Tue Feb 06, 2012  5:18 PM ------      Message from: Edwyna Perfect      Created: Mon Feb 05, 2012  3:59 PM       cxr did not show pneumonia. Recommend 7d doxycycline 100mg  bid if not allergic and no interactions. Also recommend temporary increase of prednisone. Currently taking 20mg  qd. Call in taper for 30mg  bid for 2 days then 20mg  bid x 3 days then 30mg  qd x 3 days then resume usual 20mg  qd

## 2012-02-06 NOTE — Telephone Encounter (Signed)
[facility] Triage Call Report Triage Record Num: [id number] Operator: Tomasita Crumble Patient Name: [name] Call Date & Time: 02/05/2012 8:59:49PM Patient Phone: [phone] PCP: Patient Gender: Female PCP Fax : Patient DOB: 05/10/1933 Practice Name: [facility] Reason for Call: Caller: Delaney Meigs; PCP: Marguarite Arbour (Adults only); CB#: [phone]; Call regarding Delaney Meigs is calling from [place] regarding a CBC with Diff, Feritin, Theophylline ordered on Rosine Abe by Marguarite Arbour (Adults only).; No critical values, no alerts, abnormals only reported. CBC Abnormals: Hgb 9.4, Hct 30.9, MCV 73.9, MCH 22.5, RDW 16.8. Ferritin normal 19, Theophylline level low @ 2.8; ("normal" is 10.0-20.0). Information noted and sent to office for follow up per PCP Calls, No Triage protocol due to Lab calling with test results and standing orders. Protocol(s) Used: PCP Calls, No Triage (Adult) Recommended Outcome per Protocol: Call Provider within 24 Hours Reason for Outcome: Lab calling with test results Care Advice:

## 2012-02-07 ENCOUNTER — Encounter (HOSPITAL_COMMUNITY): Payer: Medicare Other

## 2012-02-08 ENCOUNTER — Other Ambulatory Visit: Payer: Self-pay | Admitting: Internal Medicine

## 2012-02-08 ENCOUNTER — Encounter: Payer: Self-pay | Admitting: Internal Medicine

## 2012-02-08 ENCOUNTER — Encounter (HOSPITAL_COMMUNITY): Payer: Medicare Other

## 2012-02-08 ENCOUNTER — Telehealth: Payer: Self-pay | Admitting: *Deleted

## 2012-02-08 DIAGNOSIS — D649 Anemia, unspecified: Secondary | ICD-10-CM

## 2012-02-08 NOTE — Telephone Encounter (Signed)
Message copied by Regis Bill on Thu Feb 08, 2012  9:25 AM ------      Message from: Edwyna Perfect      Created: Tue Feb 06, 2012  8:46 PM       Anemia is stable. Recommend hematology if willing. Rechecked theophylline level and is low. pls confirm dosing-if taking 100mg  bid then increase to 200mg  bid.

## 2012-02-08 NOTE — Telephone Encounter (Signed)
XRay: patient informed & has begun taking both medications px/SLS Labs: patient informed; will increase dosage of theophylline [200 mg BID], *agreed to Referral to Hematology*/SLS  Patient reports she has IVIG w/provigen scheduled for Monday, 09.16.13, and states that Dr. Marjory Lies requires letter given medical clearance before pt can have infusion [or letter of denial]. Patient reports she needs letter of medical clearance to have routine Dental cleaning visit via [facility] [AttnClinton Quant, DMD [phone] ext. [phone]]. Please advise.

## 2012-02-08 NOTE — Telephone Encounter (Signed)
Message copied by Regis Bill on Thu Feb 08, 2012  9:27 AM ------      Message from: Edwyna Perfect      Created: Mon Feb 05, 2012  3:59 PM       cxr did not show pneumonia. Recommend 7d doxycycline 100mg  bid if not allergic and no interactions. Also recommend temporary increase of prednisone. Currently taking 20mg  qd. Call in taper for 30mg  bid for 2 days then 20mg  bid x 3 days then 30mg  qd x 3 days then resume usual 20mg  qd

## 2012-02-08 NOTE — Telephone Encounter (Signed)
Ok for dental clearance. Will place hematology referral order. Hasn't been on higher steroids/abx for very long. Would check with her tomorrow to see how she feels with her breathing.

## 2012-02-09 ENCOUNTER — Encounter: Payer: Self-pay | Admitting: Internal Medicine

## 2012-02-09 ENCOUNTER — Telehealth: Payer: Self-pay | Admitting: Hematology & Oncology

## 2012-02-09 ENCOUNTER — Encounter (HOSPITAL_COMMUNITY): Admission: RE | Admit: 2012-02-09 | Payer: Medicare Other | Source: Ambulatory Visit

## 2012-02-09 NOTE — Telephone Encounter (Signed)
Spoke w/Mrs. Bess and she is doing better today, getting around & breathing "is good"; informed medical clearance letter[s] will be completed & fax to their appropriate destinations and that Hematology referral has been placed; pt reports that she has already received call for this consult/SLS

## 2012-02-09 NOTE — Telephone Encounter (Signed)
Pt aware of 02-21-12 appointment

## 2012-02-10 NOTE — Assessment & Plan Note (Signed)
Suspect COPD exacerbation. Obtain chest x-ray. Antibiotic and increase her steroid dose for taper

## 2012-02-10 NOTE — Assessment & Plan Note (Signed)
Obtain CBC and ferritin. Consider hematology consult

## 2012-02-10 NOTE — Progress Notes (Signed)
Subjective:    Patient ID: [name], female    DOB: 20-Feb-1933, 78 y.o.   MRN: [id number]  HPI patient presents to clinic for evaluation shortness of breath. Its approximate one-week history of shortness of breath with a one-day history of cough productive for yellow sputum. Shortness of breath his shortness of breath on exertion without wheezing. No fever or chills. Reviewed persistent anemia without gross active bleeding. GI evaluation complete.  Past Medical History  Diagnosis Date  . Anemia   . Anxiety     patient denied  . Depression     patient denied  . Asthma   . Chronic fatigue syndrome   . COPD (chronic obstructive pulmonary disease)   . GERD (gastroesophageal reflux disease)   . Hyperlipidemia   . Hypertension   . Colon polyp   . Allergic rhinitis   . Vitamin d deficiency   . Pulmonary embolism   . Pneumonia   . Neuropathy   . Arthritis   . 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 Blood transfusion   . Osteoporosis   . Thyroid disease   . Constipation   . Weakness   . Family history of anesthesia complication     Daughter with history of malignant hyperthermia  . Malignant hyperthermia     daughter   . Seizures     1990 last seizures on meds Phenobarb  . Skin cancer     squamous cell  . Neurogenic bladder 2013    husband caths pt   Past Surgical History  Procedure Date  . Tonsillectomy and adenoidectomy   . Nasal septum surgery   . Dilation and curettage of uterus   . Tubal ligation   . Cesarean section   . Sinus surgeries     x 4  . Left ovary and tube removed   . Vocal polyps removed   . Appendectomy   . Cystocele repair   . Carpal tunnel release     right  . Shoulder arthroscopy     x2 left, 1 right  . Left finger fusion   . Right median nerve decompression   . Duptyren's contracture right hand   . Finger ganglion cyst excision     right  . Abdominal hysterectomy   . Joint replacement     right and left basal  joints of thumbs  . Bladder suspension   . Cataract extraction     bilateral  . Cervical neck ablation     x 7, C3-C4  . Squamous lesions removed     neck and face  . Basal cell carcinoma excision     face  . Panniculectomy     reports that she quit smoking about 33 years ago. Her smoking use included Cigarettes. She has a 1.5 pack-year smoking history. She has never used smokeless tobacco. She reports that she does not drink alcohol or use illicit drugs. family history includes Allergies in her sister; Cancer in her cousin, father, and sisters; Colon cancer in her father; Heart disease in her mother; Hyperlipidemia in her maternal aunt and mother; Lung cancer in her sister; Ovarian cancer in her sister; Stomach cancer in an unspecified family member; and Thyroid cancer in her sister. Allergies  Allergen Reactions  . Iodine Other (See Comments)    Cardiac arrest  . Morphine And Related Other (See Comments)    Cardiac arrest     Review of Systems see history of present illness     Objective:  Physical Exam  Nursing note and vitals reviewed. Constitutional: She appears well-developed and well-nourished. No distress.  HENT:  Head: Normocephalic and atraumatic.  Eyes: Conjunctivae normal are normal. No scleral icterus.  Neck: Neck supple. No JVD present.  Cardiovascular: Regular rhythm and normal heart sounds.   Pulmonary/Chest: Effort normal and breath sounds normal. No respiratory distress. She has no wheezes. She has no rales.  Neurological: She is alert.  Skin: She is not diaphoretic.  Psychiatric: She has a normal mood and affect.          Assessment & Plan:

## 2012-02-12 ENCOUNTER — Telehealth: Payer: Self-pay | Admitting: Internal Medicine

## 2012-02-12 ENCOUNTER — Encounter (HOSPITAL_COMMUNITY): Admission: RE | Admit: 2012-02-12 | Payer: Medicare Other | Source: Ambulatory Visit

## 2012-02-12 DIAGNOSIS — D649 Anemia, unspecified: Secondary | ICD-10-CM

## 2012-02-12 MED ORDER — TERIPARATIDE (RECOMBINANT) 600 MCG/2.4ML ~~LOC~~ SOLN: 20.0000 ug | Freq: Every day | SUBCUTANEOUS | Status: DC

## 2012-02-12 NOTE — Telephone Encounter (Signed)
Rx Done/SLS 

## 2012-02-12 NOTE — Telephone Encounter (Signed)
Refill- teriparatide, rocombinant, (forteo) 641mcg/2.4ml soln. Inject 0.08 mls(51mcg total) into the skin daily. Inject subcutaneously once a day for 28 days. Qty 2.4 last fill 8.3.13

## 2012-02-13 ENCOUNTER — Encounter (HOSPITAL_COMMUNITY): Payer: Medicare Other

## 2012-02-13 ENCOUNTER — Encounter: Payer: Self-pay | Admitting: Internal Medicine

## 2012-02-13 ENCOUNTER — Telehealth: Payer: Self-pay | Admitting: Internal Medicine

## 2012-02-13 NOTE — Telephone Encounter (Signed)
Forward 14 pages from [facility] to Dr. Erick Blinks for review on 02-13-12 ym

## 2012-02-14 ENCOUNTER — Encounter (HOSPITAL_COMMUNITY): Payer: Medicare Other

## 2012-02-14 ENCOUNTER — Ambulatory Visit: Payer: Medicare Other | Admitting: Internal Medicine

## 2012-02-14 ENCOUNTER — Encounter: Payer: Self-pay | Admitting: Internal Medicine

## 2012-02-15 ENCOUNTER — Encounter: Payer: Self-pay | Admitting: Internal Medicine

## 2012-02-15 ENCOUNTER — Encounter (HOSPITAL_COMMUNITY): Payer: Medicare Other

## 2012-02-15 ENCOUNTER — Ambulatory Visit (INDEPENDENT_AMBULATORY_CARE_PROVIDER_SITE_OTHER): Payer: Medicare Other | Admitting: Internal Medicine

## 2012-02-15 VITALS — BP 142/58 | HR 80 | Ht 62.0 in | Wt 123.0 lb

## 2012-02-15 DIAGNOSIS — D131 Benign neoplasm of stomach: Secondary | ICD-10-CM

## 2012-02-15 DIAGNOSIS — K5900 Constipation, unspecified: Secondary | ICD-10-CM

## 2012-02-15 DIAGNOSIS — Z86010 Personal history of colonic polyps: Secondary | ICD-10-CM

## 2012-02-15 DIAGNOSIS — R252 Cramp and spasm: Secondary | ICD-10-CM

## 2012-02-15 DIAGNOSIS — R110 Nausea: Secondary | ICD-10-CM

## 2012-02-15 DIAGNOSIS — K3184 Gastroparesis: Secondary | ICD-10-CM

## 2012-02-15 MED ORDER — METOCLOPRAMIDE HCL 5 MG PO TABS: 5.0000 mg | ORAL_TABLET | Freq: Two times a day (BID) | ORAL | Status: DC

## 2012-02-15 NOTE — Progress Notes (Signed)
Subjective:    Patient ID: [name], female    DOB: [birth year], 78 y.o.   MRN: [id number]  HPI [name] is a 78 yo female with a complicated PMH of CIDP, COPD, anemia, GERD, hypertension, hyperlipidemia, rectocele, adenomatous colon polyps status post right hemicolectomy for large sessile serrated adenoma who seen for followup. [name] is accompanied today by her husband. She underwent a laparoscopic right hemicolectomy in April 2013. Her postoperative course was slow, but ultimately she did well. She was seen for followup upper endoscopy on 01/16/2012. This was performed to followup on a fundic gland polyp with low-grade dysplasia removed in January 2013. The upper endoscopy revealed food residue in the gastric fundus and duodenal bulb. Mild gastropathy but no residual polyp tissue seen. She reports that she has been following a gastroparesis diet, and this is helped with her morning nausea and dry heaves. When she regular meals (outside of the gastroparesis diet) she has significant morning nausea which is intermittently relieved with ondansetron. Often she will have dry heaves and occasional vomiting. She has been troubled with ongoing lower extremity cramping, and workup revealed normal electrolytes. She continues to have constipation, but is relieved with MiraLAX twice daily. She has a known rectocele, so she works hard to avoid constipation. Occasionally, manual disimpaction as required when she is constipated.  Heartburn has not been a big issue for her of late, and she has remained on Dexilant 60 mg daily.  She is working with her neurologist for her neuropathy/demyelinating process, and has a pending evaluation for her anemia. She continues to have issues with unstable gait.  Review of Systems As per HPI, otherwise negative  Current Medications, Allergies, Past Medical History, Past Surgical History, Family History and Social History were reviewed in [employer] record.     Objective:   Physical Exam BP 142/58  Pulse 80  Ht 5\' 2"  (1.575 m)  Wt 123 lb (55.792 kg)  BMI 22.50 kg/m2 Constitutional: Well-developed and well-nourished. No distress. HEENT: Normocephalic and atraumatic. Oropharynx is clear and somewhat dry. No oropharyngeal exudate. Conjunctivae are normal.  No scleral icterus. Neck: Neck supple. Trachea midline. Cardiovascular: Normal rate, regular rhythm and intact distal pulses. No M/R/G Pulmonary/chest: Effort normal and breath sounds normal. No wheezing, rales or rhonchi. Abdominal: Soft, nontender, nondistended. Bowel sounds active throughout. There are no masses palpable. No hepatosplenomegaly. Extremities: no clubbing, cyanosis, or edema changes of arthritis noted bilateral hands Lymphadenopathy: No cervical adenopathy noted. Neurological: Alert and oriented to person place and time. Skin: Skin is warm and dry. No rashes noted. Psychiatric: Normal mood and affect. Behavior is normal.      Assessment & Plan:   78 yo female with a complicated PMH of CIDP, COPD, anemia, GERD, hypertension, hyperlipidemia, rectocele, adenomatous colon polyps status post right hemicolectomy for large sessile serrated adenoma who seen for followup.  1. Gastroparesis/nausea --  She is improving with a gastroparesis diet, and we'll continue to use as needed Zofran for nausea. She is well aware of the gastroparesis diet and is working to stick to it. I recommended that she eat 3 small meals daily, and avoid eating late at night which likely will help her the next morning to avoid nausea and vomiting. I will prescribe metoclopramide 5 mg to be used sparingly as needed for severe nausea and dry heaves. We discussed the neurologic side effects of this medication, and I made her wear that I would like her to use this sparingly, for  the shortest duration possible, and at the lowest dose possible. If this drug significantly helps her, we could consider  domperidone in the future, given its more tolerable neurologic profile.  2.  Hx fundic gland polyp with LGD -- repeat EGD revealed no residual polyp tissue, therefore this polyp is felt to be completely removed. No further EGD planned unless symptoms warrant  3.  Hx of large tubular adenoma s/p right hemi-colectomy -- the patient will be due a repeat colonoscopy in April 2014.  4.  LE cramping -- electrolytes have been checked and her potassium and magnesium were normal. I have suggested she try to increase her fluid intake to assure she is well-hydrated. She will discuss this issue more with her primary doctor and neurologist.  5.  Constipation -- continue 34 g MiraLAX daily as this is helping her avoid constipation.  I will see her back in 3-4 months for followup.

## 2012-02-15 NOTE — Patient Instructions (Addendum)
Take miralax 17 g daily Try to drink 1-2 glasses more of water daily to help stay hydrated.  We have sent the following medications to your pharmacy for you to pick up at your convenience: Reglan; please take as directed  Follow up with Dr. Rhea Belton in 3 months       Gastroparesis  Gastroparesis is also called slowed stomach emptying (delayed gastric emptying). It is a condition in which the stomach takes too long to empty its contents. It often happens in people with diabetes.  CAUSES  Gastroparesis happens when nerves to the stomach are damaged or stop working. When the nerves are damaged, the muscles of the stomach and intestines do not work normally. The movement of food is slowed or stopped. High blood glucose (sugar) causes changes in nerves and can damage the blood vessels that carry oxygen and nutrients to the nerves. RISK FACTORS  Diabetes.   Post-viral syndromes.   Eating disorders (anorexia, bulimia).   Surgery on the stomach or vagus nerve.   Gastroesophageal reflux disease (rarely).   Smooth muscle disorders (amyloidosis, scleroderma).   Metabolic disorders, including hypothyroidism.   Parkinson's disease.  SYMPTOMS   Heartburn.   Feeling sick to your stomach (nausea).   Vomiting of undigested food.   An early feeling of fullness when eating.   Weight loss.   Abdominal bloating.   Erratic blood glucose levels.   Lack of appetite.   Gastroesophageal reflux.   Spasms of the stomach wall.  Complications can include:  Bacterial overgrowth in stomach. Food stays in the stomach and can ferment and cause bacteria to grow.   Weight loss due to difficulty digesting and absorbing nutrients.   Vomiting.   Obstruction in the stomach. Undigested food can harden and cause nausea and vomiting.   Blood glucose fluctuations caused by inconsistent food absorption.  DIAGNOSIS  The diagnosis of gastroparesis is confirmed through one or more of the following  tests:  Barium X-rays and scans. These tests look at how long it takes for food to move through the stomach.   Gastric manometry. This test measures electrical and muscular activity in the stomach. A thin tube is passed down the throat into the stomach. The tube contains a wire that takes measurements of the stomach's electrical and muscular activity as it digests liquids and solid food.   Endoscopy. This procedure is done with a long, thin tube called an endoscope. It is passed through the mouth and gently guides down the esophagus into the stomach. This tube helps the caregiver look at the lining of the stomach to check for any abnormalities.   Ultrasound. This can rule out gallbladder disease or pancreatitis. This test will outline and define the shape of the gallbladder and pancreas.  TREATMENT   The primary treatment is to identify the problem and help control blood glucose levels. Treatments include:   Exercise.   Medicines to control nausea and vomiting.   Medicines to stimulate stomach muscles.   Changes in what and when you eat.   Having smaller meals more often.   Eating low-fiber forms of high-fiber foods, such aseating cooked vegetables instead of raw vegetables.   Eating low-fat foods.   Consuming liquids, which are easier to digest.   In severe cases, feeding tubes and intravenous (IV) feeding may be needed.  It is important to note that in most cases, treatment does not cure gastroparesis. It is usually a lasting (chronic) condition. Treatment helps you manage the condition so that  you can be as healthy and comfortable as possible. NEW TREATMENTS  A gastric neurostimulator has been developed to assist people with gastroparesis. The battery-operated device is surgically implanted. It emits mild electrical pulses to help improve stomach emptying and to control nausea and vomiting.   The use of botulinum toxin has been shown to improve stomach emptying by decreasing the  prolonged contractions of the muscle between the stomach and the small intestine (pyloric sphincter). The benefits are temporary.  SEEK MEDICAL CARE IF:   You are having problems keeping your blood glucose in goal range.   You are having nausea, vomiting, bloating, or early feelings of fullness with eating.   Your symptoms do not change with a change in diet.  Document Released: 05/15/2005 Document Revised: 05/04/2011 Document Reviewed: 10/22/2008 [facility] Patient Information 2012 [place], [place].

## 2012-02-16 ENCOUNTER — Encounter (HOSPITAL_COMMUNITY): Payer: Medicare Other

## 2012-02-16 ENCOUNTER — Other Ambulatory Visit: Payer: Self-pay | Admitting: Internal Medicine

## 2012-02-16 ENCOUNTER — Encounter: Payer: Self-pay | Admitting: Internal Medicine

## 2012-02-16 NOTE — Telephone Encounter (Signed)
Cyclobenzaprine [Last Rx 08.29.13 #20x0]/SLS Please advise.

## 2012-02-18 NOTE — Telephone Encounter (Signed)
60

## 2012-02-19 NOTE — Telephone Encounter (Signed)
Rx done/SLS 

## 2012-02-20 ENCOUNTER — Encounter: Payer: Self-pay | Admitting: Internal Medicine

## 2012-02-20 ENCOUNTER — Encounter: Payer: Self-pay | Admitting: Critical Care Medicine

## 2012-02-21 ENCOUNTER — Ambulatory Visit (HOSPITAL_BASED_OUTPATIENT_CLINIC_OR_DEPARTMENT_OTHER): Payer: Medicare Other | Admitting: Hematology & Oncology

## 2012-02-21 ENCOUNTER — Other Ambulatory Visit (HOSPITAL_BASED_OUTPATIENT_CLINIC_OR_DEPARTMENT_OTHER): Payer: Medicare Other | Admitting: Lab

## 2012-02-21 ENCOUNTER — Ambulatory Visit: Payer: Medicare Other

## 2012-02-21 VITALS — BP 122/63 | HR 99 | Temp 98.1°F | Resp 18 | Ht 62.0 in | Wt 122.0 lb

## 2012-02-21 DIAGNOSIS — I10 Essential (primary) hypertension: Secondary | ICD-10-CM

## 2012-02-21 DIAGNOSIS — E785 Hyperlipidemia, unspecified: Secondary | ICD-10-CM

## 2012-02-21 DIAGNOSIS — D649 Anemia, unspecified: Secondary | ICD-10-CM

## 2012-02-21 LAB — CBC WITH DIFFERENTIAL (CANCER CENTER ONLY)
BASO#: 0 10*3/uL (ref 0.0–0.2)
BASO%: 0.5 % (ref 0.0–2.0)
EOS%: 4.2 % (ref 0.0–7.0)
Eosinophils Absolute: 0.3 10*3/uL (ref 0.0–0.5)
HCT: 35.2 % (ref 34.8–46.6)
HGB: 11.3 g/dL — ABNORMAL LOW (ref 11.6–15.9)
LYMPH#: 2.4 10*3/uL (ref 0.9–3.3)
LYMPH%: 38.6 % (ref 14.0–48.0)
MCH: 24.9 pg — ABNORMAL LOW (ref 26.0–34.0)
MCHC: 32.1 g/dL (ref 32.0–36.0)
MCV: 78 fL — ABNORMAL LOW (ref 81–101)
MONO#: 0.5 10*3/uL (ref 0.1–0.9)
MONO%: 8 % (ref 0.0–13.0)
NEUT#: 3 10*3/uL (ref 1.5–6.5)
NEUT%: 48.7 % (ref 39.6–80.0)
Platelets: 318 10*3/uL (ref 145–400)
RBC: 4.54 10*6/uL (ref 3.70–5.32)
RDW: 22.6 % — ABNORMAL HIGH (ref 11.1–15.7)
WBC: 6.2 10*3/uL (ref 3.9–10.0)

## 2012-02-21 NOTE — Progress Notes (Signed)
This office note has been dictated.

## 2012-02-22 ENCOUNTER — Telehealth: Payer: Self-pay | Admitting: Hematology & Oncology

## 2012-02-22 NOTE — Progress Notes (Signed)
CC:   Julia Arbour, MD [name]. Julia Field, MD, FCCP  DIAGNOSES: 1. Microcytic anemia-possible iron deficiency. 2. Chronic inflammatory demyelinating polyneuropathy. 3. Hypertension. 4. Hyperlipidemia. 5. Complex regional pain syndrome in her hands. 6. Epilepsy.  HISTORY OF PRESENT ILLNESS:  [name] is a very nice 78 year old white female.  She has numerous medical issues.  She has this chronic inflammatory demyelinating polyneuropathy. She was diagnosed with this back in November 2012.  She is having problems with neuropathy.  She then began to have problems with her bladder.  She has a neurogenic bladder.  She also has some bowel incontinence.  She has had a lumbar puncture, which apparently showed the CIDP.  She is seen by numerous doctors.  She has numerous health issues.  She is on numerous medications.  She was taking IVIG for the CIDP.  This seemed to help her a little bit. However, her neurologist felt that there may have been some issues with the IVIG and her last dose was about 5 weeks ago.  Of note, she has had a dysplastic polyp.  This required a right hemicolectomy back a few years ago.  Again, this was not malignant, however.  She has some gastroparesis.  This is from, I think, a vagus nerve issue. I think she has had surgery for this.  She had been on steroids.  Steroids have been tapered, I think, down.  She has had some lab work done.  She has been followed by Dr. Rodena Medin. She has been noted to have some worsening anemia.  Going back to April of this year, a CBC was done which showed a white count 11.8, hemoglobin 13.4, hematocrit 39, platelet count 248.  A week later, her hemoglobin was 10.8, hematocrit 32.0.  In August, CBC was done which showed a white count of 4.9, hemoglobin 9.7, hematocrit 30, and platelet count was 332.  MCV was 72.  This was repeated a week later.  She was even more anemic with a hemoglobin of 9.2, hematocrit 28.6.  MCV was down to  70.  On September 9th, a CBC was done which showed a white count 6.1, hemoglobin 9.4, hematocrit 31, platelet count was 328.  Her MCV was 74. She did have a ferritin done.  This was found to be 19.  Two weeks prior it was 10.  She has been put on oral iron.  She is doing okay with this.  She has not noted any obvious bleeding.  Her stools are a little bit dark because of the iron that she took.  There is no hematochezia. There is no hematemesis.  Her weight has gone down a little bit.  Again, she has this polyneuropathy, which really has been her biggest problem.  She has this chronic regional pain syndrome, which also has been an issue.  She had been getting stellate ganglion blocks in the past.  These had help her but were quite painful.  Again, she was kindly referred to the [facility] to see about the anemia workup.  PAST MEDICAL HISTORY:  Well-documented in the medical chart.  She has: 1. CIDP. 2. Hypertension. 3. Hyperlipidemia. 4. COPD. 5. GERD. 6. Depression. 7. Chronic regional pain syndrome. 8. Osteoporosis. 9. Epilepsy. 10.Chronic fatigue syndrome. 11.Multiple surgeries.  ALLERGIES: 1. Iodine. 2. Morphine.  MEDICATIONS: 1. Albuterol inhaler 2 puffs q.4 hours p.r.n. 2. Norvasc 10 mg p.o. daily. 3. Adderall 10 mg p.o. q.i.d. 4. Brovana nebulizer 15 mcg b.i.d. 5. Pulmicort nebulizer 0.25 mg b.i.d.  6. Zyrtec 10 mg p.o. daily. 7. Flexeril 5 mg p.o. q.h.s. p.r.n. 8. Dexilant 60 mg p.o. daily. 9. Voltaren gel as needed. 10.Iron sulfate 325 mg p.o. t.i.d. 11.Hycodan cough syrup 5 mL as needed for cough. 12.Mag oxide 400 mg p.o. b.i.d. 13.Singulair 10 mg p.o. q.h.s. 14.Zofran ODT 4 mg as needed for nausea. 15.Percocet (10/325) 1 p.o. q.4 hours p.r.n. pain. 16.Opana ER 20 mg p.o. q.12 hours. 17.Phenobarbital 65 mg p.o. q.i.d. 18.MiraLAX 17 g p.o. q.h.s. 19.Prednisone 20 mg p.o. daily. 20.Potassium chloride 20 mEq p.o. every other  day. 21.Zocor 40 mg p.o. q.h.s. 22.Theo-Dur 300 mg p.o. b.i.d. 23.Reglan 5 mg p.o. b.i.d.  SOCIAL HISTORY:  Remarkable for past tobacco use.  She really has no significant alcohol use.  FAMILY HISTORY:  Remarkable for sister with lung cancer; she smoked. There is also history of ovarian cancer, thyroid cancer and colon cancer.  REVIEW OF SYSTEMS:  As stated in history of present illness.  No additional findings noted on a 12-system review.  PHYSICAL EXAMINATION:  This is a petite white female in no obvious distress.  Vital signs:  Temperature of 98.1, pulse 99, respiratory rate 18, blood pressure 120/63.  Weight is 122.  Head and neck: Normocephalic, atraumatic skull.  There are no ocular or oral lesions. There are no palpable cervical or supraclavicular lymph nodes.  Lungs: Clear bilaterally.  Cardiac:  Regular rate and rhythm with a normal S1 and S2.  She has a 1/6 systolic ejection murmur.  Abdomen:  Soft with good bowel sounds.  There is no fluid wave.  She has a laparotomy scar in the epigastric area.  This is well-healed.  There is no palpable hepatosplenomegaly.  Back:  No tenderness over the spine, ribs, or hips. Extremities:  Moderate osteoarthritic changes in her joints.  She has fusion scars in her hands and fingers.  She has no edema in her lower legs.  She does have decreased range of motion of some of her joints. Skin:  No ecchymoses or petechia.  Neurological:  No focal neurological deficits.  LABORATORY STUDIES:  White cell count is 6.2, hemoglobin 11.3, hematocrit 35.2, platelet count 318.  MCV is 78.  Peripheral smear shows a normochromic, normocytic population of red blood cells.  There are some microcytic red cells.  There are some slight hypochromic red cells. There is no polychromasia.  I see no nucleated red blood cells.  There are no target cells.  I see no rouleaux formation.  White cells appear normal in morphology and maturation.  There are no immature  myeloid or lymphoid forms.  There are no atypical lymphocytes.  Platelets are adequate in number and size.  IMPRESSION:  [name] is a very charming 78 year old white female. She has numerous medical problems.  She is on numerous medications.  I am actually quite surprised that her hemoglobin has gotten better.  I would have thought that with all her medications, particularly the Dexilant, that she would not be able to absorb oral iron that well.  Her MCV is up a little bit.  This must mean that her iron is being absorbed, and that she is able utilize it.  I also think that some of the anemia may have been from the IVIG.  It is not common to see anemia with IVIG.  She has had this stopped.  I do not see a need for bone marrow test.  I think that her hemoglobin is better without having to anything, so we will  just see how she improves, if any.  I did send off iron studies.  I sent off some other anemia studies.  I sent off an SPEP on her.  It would not surprise me if she has a positive SPEP given that she has CIDP.  I spent a good hour and a half with her and her husband.  They are both very, very nice people.  I had a great time talking to them.  We will plan to get her back in 2 months' time.  At that point in time, we should have a good idea as to this anemia.  I think that she always may have some degree of anemia.  I think this is just going to be natural, given that she has all these health issues, she is under chronic "stress" from these inflammatory conditions.  I suppose she may have some degree of anemia of chronic disease.    ______________________________ Josph Macho, M.D. PRE/MEDQ  D:  02/21/2012  T:  02/22/2012  Job:  [id number]

## 2012-02-22 NOTE — Telephone Encounter (Signed)
Mailed November schedule °

## 2012-02-23 LAB — PROTEIN ELECTROPHORESIS, SERUM, WITH REFLEX
Albumin ELP: 56 % (ref 55.8–66.1)
Alpha-1-Globulin: 4.3 % (ref 2.9–4.9)
Alpha-2-Globulin: 10.6 % (ref 7.1–11.8)
Beta 2: 4.6 % (ref 3.2–6.5)
Beta Globulin: 6.2 % (ref 4.7–7.2)
Gamma Globulin: 18.3 % (ref 11.1–18.8)
Total Protein, Serum Electrophoresis: 7.1 g/dL (ref 6.0–8.3)

## 2012-02-23 LAB — HEMOGLOBINOPATHY EVALUATION
Hemoglobin Other: 0 %
Hgb A2 Quant: 2.1 % — ABNORMAL LOW (ref 2.2–3.2)
Hgb A: 97.9 % — ABNORMAL HIGH (ref 96.8–97.8)
Hgb F Quant: 0 % (ref 0.0–2.0)
Hgb S Quant: 0 %

## 2012-02-23 LAB — IRON AND TIBC
%SAT: 33 % (ref 20–55)
Iron: 110 ug/dL (ref 42–145)
TIBC: 338 ug/dL (ref 250–470)
UIBC: 228 ug/dL (ref 125–400)

## 2012-02-23 LAB — RETICULOCYTES (CHCC)
ABS Retic: 32.9 10*3/uL (ref 19.0–186.0)
RBC.: 4.7 MIL/uL (ref 3.87–5.11)
Retic Ct Pct: 0.7 % (ref 0.4–2.3)

## 2012-02-23 LAB — FERRITIN: Ferritin: 18 ng/mL (ref 10–291)

## 2012-02-23 LAB — ERYTHROPOIETIN: Erythropoietin: 17.8 m[IU]/mL (ref 2.6–34.0)

## 2012-02-29 ENCOUNTER — Other Ambulatory Visit: Payer: Self-pay | Admitting: *Deleted

## 2012-02-29 ENCOUNTER — Telehealth: Payer: Self-pay | Admitting: *Deleted

## 2012-02-29 DIAGNOSIS — D649 Anemia, unspecified: Secondary | ICD-10-CM

## 2012-02-29 NOTE — Telephone Encounter (Signed)
Message copied by Anselm Jungling on Thu Feb 29, 2012  2:39 PM ------      Message from: Arlan Organ R      Created: Mon Feb 26, 2012  7:21 AM       Please call - Iron is still pretty low.  Need IV Feraheme 1020mg  x 1 dose.  May give in 1-2 weeks.  Please set up!!  Thanks!! Cindee Lame

## 2012-02-29 NOTE — Telephone Encounter (Signed)
Called patient to let her know that we need to speak to her about her labwork. Told patient to call us back to discuss her labs.

## 2012-03-05 ENCOUNTER — Telehealth: Payer: Self-pay | Admitting: *Deleted

## 2012-03-05 DIAGNOSIS — Z79899 Other long term (current) drug therapy: Secondary | ICD-10-CM

## 2012-03-05 LAB — CBC WITH DIFFERENTIAL/PLATELET
Basophils Absolute: 0 10*3/uL (ref 0.0–0.1)
Basophils Relative: 1 % (ref 0–1)
Eosinophils Absolute: 0.3 10*3/uL (ref 0.0–0.7)
Eosinophils Relative: 5 % (ref 0–5)
HCT: 33.8 % — ABNORMAL LOW (ref 36.0–46.0)
Hemoglobin: 11.4 g/dL — ABNORMAL LOW (ref 12.0–15.0)
Lymphocytes Relative: 29 % (ref 12–46)
Lymphs Abs: 1.7 10*3/uL (ref 0.7–4.0)
MCH: 25.4 pg — ABNORMAL LOW (ref 26.0–34.0)
MCHC: 33.7 g/dL (ref 30.0–36.0)
MCV: 75.3 fL — ABNORMAL LOW (ref 78.0–100.0)
Monocytes Absolute: 0.4 10*3/uL (ref 0.1–1.0)
Monocytes Relative: 7 % (ref 3–12)
Neutro Abs: 3.3 10*3/uL (ref 1.7–7.7)
Neutrophils Relative %: 58 % (ref 43–77)
Platelets: 294 10*3/uL (ref 150–400)
RBC: 4.49 MIL/uL (ref 3.87–5.11)
RDW: 22.5 % — ABNORMAL HIGH (ref 11.5–15.5)
WBC: 5.7 10*3/uL (ref 4.0–10.5)

## 2012-03-05 LAB — FERRITIN: Ferritin: 22 ng/mL (ref 10–291)

## 2012-03-05 NOTE — Telephone Encounter (Signed)
Pt came to the lab for blood work and insisted that she should be having her theophyline and phenobarbital levels checked.  Please advise.

## 2012-03-05 NOTE — Addendum Note (Signed)
Addended by: Mervin Kung A on: 03/05/2012 04:44 PM   Modules accepted: Orders

## 2012-03-05 NOTE — Telephone Encounter (Signed)
Ok to order v58.69 however she my chart messaged me 9/24 saying Dr. Bethanie Dicker checked it and adjusted medication. My understanding he is an outside pulmonologist. Multiple doctors checking these levels and adjusting the same medications can lead to mistakes

## 2012-03-05 NOTE — Telephone Encounter (Signed)
Pt presented to the lab, orders released. 

## 2012-03-06 NOTE — Telephone Encounter (Signed)
Orders entered. Pt notified and voices understanding. She reports that she had her pulmonologist check it last because her Primary Provider was out of the office and she did not feel comfortable seeing our NP that she has never seen before.

## 2012-03-07 ENCOUNTER — Other Ambulatory Visit: Payer: Self-pay | Admitting: Internal Medicine

## 2012-03-07 LAB — THEOPHYLLINE LEVEL: Theophylline Lvl: 1.9 ug/mL — ABNORMAL LOW (ref 10.0–20.0)

## 2012-03-07 LAB — PHENOBARBITAL LEVEL: Phenobarbital: 24.5 ug/mL (ref 15.0–40.0)

## 2012-03-12 ENCOUNTER — Telehealth: Payer: Self-pay | Admitting: *Deleted

## 2012-03-12 ENCOUNTER — Other Ambulatory Visit: Payer: Self-pay | Admitting: *Deleted

## 2012-03-12 ENCOUNTER — Telehealth: Payer: Self-pay | Admitting: Internal Medicine

## 2012-03-12 DIAGNOSIS — Z79899 Other long term (current) drug therapy: Secondary | ICD-10-CM

## 2012-03-12 DIAGNOSIS — E611 Iron deficiency: Secondary | ICD-10-CM

## 2012-03-12 MED ORDER — SIMVASTATIN 40 MG PO TABS: 40.0000 mg | ORAL_TABLET | Freq: Every day | ORAL | Status: DC

## 2012-03-12 NOTE — Telephone Encounter (Signed)
Patient informed, understood & agreed, having infusion [Hgb] tomorrow, future lab ordered/SLS SEE phone note.

## 2012-03-12 NOTE — Telephone Encounter (Signed)
Refill simvastatin 40 mg tab #30 take 1 tablet daily at bedtime last fill 02-09-12

## 2012-03-12 NOTE — Telephone Encounter (Addendum)
Message copied by Mirian Capuchin on Tue Mar 12, 2012 10:07 AM ------      Message from: Arlan Organ R      Created: Mon Feb 26, 2012  7:21 AM       Please call - Iron is still pretty low.  Need IV Feraheme 1020mg  x 1 dose.  May give in 1-2 weeks.  Please set up!!  Thanks!! [facility] [employer] given to pt.  Scheduled for 03/13/12.

## 2012-03-12 NOTE — Progress Notes (Signed)
Order place per Dr. Myna Hidalgo so pt may be scheduled.

## 2012-03-12 NOTE — Telephone Encounter (Signed)
Message copied by Regis Bill on Tue Mar 12, 2012  5:06 PM ------      Message from: Edwyna Perfect      Created: Tue Mar 12, 2012  8:14 AM       pls increase theo dose to 400mg  po bid. Recheck theophylline level approx one week after dose change

## 2012-03-13 ENCOUNTER — Ambulatory Visit (HOSPITAL_BASED_OUTPATIENT_CLINIC_OR_DEPARTMENT_OTHER): Payer: Medicare Other

## 2012-03-13 VITALS — BP 130/79 | HR 91 | Temp 97.4°F | Resp 18

## 2012-03-13 DIAGNOSIS — E611 Iron deficiency: Secondary | ICD-10-CM

## 2012-03-13 DIAGNOSIS — D649 Anemia, unspecified: Secondary | ICD-10-CM

## 2012-03-13 MED ORDER — SODIUM CHLORIDE 0.9 % IV SOLN
1020.0000 mg | Freq: Once | INTRAVENOUS | Status: AC
  Administered 2012-03-13: 1020 mg via INTRAVENOUS
  Filled 2012-03-13: qty 34

## 2012-03-13 MED ORDER — SODIUM CHLORIDE 0.9 % IV SOLN
Freq: Once | INTRAVENOUS | Status: AC
  Administered 2012-03-13: 11:00:00 via INTRAVENOUS

## 2012-03-13 NOTE — Patient Instructions (Signed)
Ferumoxytol injection What is this medicine? FERUMOXYTOL is an iron complex. Iron is used to make healthy red blood cells, which carry oxygen and nutrients throughout the body. This medicine is used to treat iron deficiency anemia in people with chronic kidney disease. This medicine may be used for other purposes; ask your health care provider or pharmacist if you have questions. What should I tell my health care provider before I take this medicine? They need to know if you have any of these conditions: -anemia not caused by low iron levels -high levels of iron in the blood -magnetic resonance imaging (MRI) test scheduled -an unusual or allergic reaction to iron, other medicines, foods, dyes, or preservatives -pregnant or trying to get pregnant -breast-feeding How should I use this medicine? This medicine is for infusion into a vein. It is given by a health care professional in a hospital or clinic setting. Talk to your pediatrician regarding the use of this medicine in children. Special care may be needed. Overdosage: If you think you've taken too much of this medicine contact a poison control center or emergency room at once. Overdosage: If you think you have taken too much of this medicine contact a poison control center or emergency room at once. NOTE: This medicine is only for you. Do not share this medicine with others. What if I miss a dose? It is important not to miss your dose. Call your doctor or health care professional if you are unable to keep an appointment. What may interact with this medicine? This medicine may interact with the following medications: -other iron products This list may not describe all possible interactions. Give your health care provider a list of all the medicines, herbs, non-prescription drugs, or dietary supplements you use. Also tell them if you smoke, drink alcohol, or use illegal drugs. Some items may interact with your medicine. What should I watch  for while using this medicine? Visit your doctor or healthcare professional regularly. Tell your doctor or healthcare professional if your symptoms do not start to get better or if they get worse. You may need blood work done while you are taking this medicine. You may need to follow a special diet. Talk to your doctor. Foods that contain iron include: whole grains/cereals, dried fruits, beans, or peas, leafy green vegetables, and organ meats (liver, kidney). What side effects may I notice from receiving this medicine? Side effects that you should report to your doctor or health care professional as soon as possible: -allergic reactions like skin rash, itching or hives, swelling of the face, lips, or tongue -breathing problems -changes in blood pressure -feeling faint or lightheaded, falls -fever or chills -flushing, sweating, or hot feelings -swelling of the ankles or feet Side effects that usually do not require medical attention (Report these to your doctor or health care professional if they continue or are bothersome.): -diarrhea -headache -nausea, vomiting -stomach pain This list may not describe all possible side effects. Call your doctor for medical advice about side effects. You may report side effects to FDA at 1-800-FDA-1088. Where should I keep my medicine? This drug is given in a hospital or clinic and will not be stored at home. NOTE: This sheet is a summary. It may not cover all possible information. If you have questions about this medicine, talk to your doctor, pharmacist, or health care provider.  2012, Elsevier/[employer] Standard. (02/05/2008 9:48:25 PM) 

## 2012-03-14 ENCOUNTER — Encounter: Payer: Self-pay | Admitting: Internal Medicine

## 2012-03-15 ENCOUNTER — Telehealth: Payer: Self-pay | Admitting: Internal Medicine

## 2012-03-15 NOTE — Telephone Encounter (Signed)
Forward 24 pages from [facility] to dr. Erick Blinks for review on 03-15-12 ym

## 2012-03-16 ENCOUNTER — Other Ambulatory Visit: Payer: Self-pay | Admitting: Internal Medicine

## 2012-03-16 MED ORDER — THEOPHYLLINE ER 200 MG PO TB12: ORAL_TABLET | ORAL | Status: DC

## 2012-03-18 ENCOUNTER — Encounter: Payer: Self-pay | Admitting: Internal Medicine

## 2012-03-19 ENCOUNTER — Telehealth: Payer: Self-pay | Admitting: Internal Medicine

## 2012-03-19 MED ORDER — AMPHETAMINE-DEXTROAMPHETAMINE 10 MG PO TABS: 10.0000 mg | ORAL_TABLET | Freq: Four times a day (QID) | ORAL | Status: DC

## 2012-03-19 MED ORDER — AMLODIPINE BESYLATE 10 MG PO TABS: 10.0000 mg | ORAL_TABLET | Freq: Every morning | ORAL | Status: DC

## 2012-03-19 NOTE — Telephone Encounter (Signed)
Rx to pharmacy/SLS 

## 2012-03-19 NOTE — Telephone Encounter (Signed)
Refill- norvasc 10mg  tab. Take one tablet (10mg  total) by mouth daily. Qty 30 last fill 9.24.13

## 2012-03-19 NOTE — Telephone Encounter (Signed)
Rx printed & signed by MD; pt notified ready for p/u Mon-Fri 8a-5p/SLS

## 2012-03-20 NOTE — Addendum Note (Signed)
Addended by: Regis Bill on: 03/20/2012 02:10 PM   Modules accepted: Orders

## 2012-03-20 NOTE — Telephone Encounter (Signed)
Lab order released/SLS 

## 2012-03-21 ENCOUNTER — Telehealth: Payer: Self-pay | Admitting: *Deleted

## 2012-03-21 LAB — THEOPHYLLINE LEVEL: Theophylline Lvl: 25.5 ug/mL — ABNORMAL HIGH (ref 10.0–20.0)

## 2012-03-21 NOTE — Telephone Encounter (Signed)
LMOM with contact name & number for return call RE: results & further instructions from MD/SLS

## 2012-03-21 NOTE — Telephone Encounter (Signed)
Message copied by Regis Bill on Thu Mar 21, 2012  2:03 PM ------      Message from: Edwyna Perfect      Created: Thu Mar 21, 2012 12:19 PM       Theophylline level is high instead of low. Believe the timing of the labs is playing a part (peak vs trough). Hold theophylline for 2 days then resume 300mg  bid. Check another level approx 4 days after resuming BUT have her draw the level 1-2 hours after taking her dose. Only way i know to sort it out

## 2012-03-22 ENCOUNTER — Encounter: Payer: Self-pay | Admitting: Internal Medicine

## 2012-03-26 ENCOUNTER — Telehealth: Payer: Self-pay | Admitting: Internal Medicine

## 2012-03-26 DIAGNOSIS — Z5181 Encounter for therapeutic drug level monitoring: Secondary | ICD-10-CM

## 2012-03-26 MED ORDER — LIDOCAINE 5 % EX PTCH: 2.0000 | MEDICATED_PATCH | Freq: Every morning | CUTANEOUS | Status: DC

## 2012-03-26 NOTE — Telephone Encounter (Signed)
Refill- lidocaine(lidoderm) 5%. Place two patches onto the skin daily. Remove and discard patch within 12 hours or as directed by MD. Rob Bunting 60 last fill 9.15.13

## 2012-03-26 NOTE — Telephone Encounter (Signed)
#  [id number] 

## 2012-03-26 NOTE — Telephone Encounter (Signed)
Rx to pharmacy/SLS 

## 2012-03-28 ENCOUNTER — Other Ambulatory Visit: Payer: Self-pay | Admitting: Internal Medicine

## 2012-03-28 DIAGNOSIS — Z5181 Encounter for therapeutic drug level monitoring: Secondary | ICD-10-CM

## 2012-03-28 NOTE — Addendum Note (Signed)
Addended by: Regis Bill on: 03/28/2012 02:28 PM   Modules accepted: Orders

## 2012-03-29 LAB — THEOPHYLLINE LEVEL: Theophylline Lvl: 16.4 ug/mL (ref 10.0–20.0)

## 2012-04-03 ENCOUNTER — Encounter: Payer: Self-pay | Admitting: Internal Medicine

## 2012-04-05 NOTE — Telephone Encounter (Signed)
Notes Recorded by Regis Bill, CMA on 04/03/2012 at 2:52 PM Informed patient/SLS ------  Notes Recorded by Edwyna Perfect, MD on 03/29/2012 at 12:27 PM Nl. Same dose

## 2012-04-12 ENCOUNTER — Ambulatory Visit: Payer: Medicare Other | Admitting: Internal Medicine

## 2012-04-16 ENCOUNTER — Other Ambulatory Visit: Payer: Self-pay | Admitting: Internal Medicine

## 2012-04-16 NOTE — Telephone Encounter (Signed)
Rx to pharmacy/SLS 

## 2012-04-21 ENCOUNTER — Encounter: Payer: Self-pay | Admitting: Internal Medicine

## 2012-04-22 ENCOUNTER — Other Ambulatory Visit (HOSPITAL_BASED_OUTPATIENT_CLINIC_OR_DEPARTMENT_OTHER): Payer: Medicare Other | Admitting: Lab

## 2012-04-22 ENCOUNTER — Ambulatory Visit (HOSPITAL_BASED_OUTPATIENT_CLINIC_OR_DEPARTMENT_OTHER): Payer: Medicare Other | Admitting: Hematology & Oncology

## 2012-04-22 VITALS — BP 143/81 | HR 113 | Temp 98.0°F | Resp 16 | Ht 62.0 in | Wt 120.0 lb

## 2012-04-22 DIAGNOSIS — G40909 Epilepsy, unspecified, not intractable, without status epilepticus: Secondary | ICD-10-CM

## 2012-04-22 DIAGNOSIS — D509 Iron deficiency anemia, unspecified: Secondary | ICD-10-CM

## 2012-04-22 DIAGNOSIS — D649 Anemia, unspecified: Secondary | ICD-10-CM

## 2012-04-22 DIAGNOSIS — G622 Polyneuropathy due to other toxic agents: Secondary | ICD-10-CM

## 2012-04-22 DIAGNOSIS — G6189 Other inflammatory polyneuropathies: Secondary | ICD-10-CM

## 2012-04-22 LAB — CBC WITH DIFFERENTIAL (CANCER CENTER ONLY)
BASO#: 0.1 10*3/uL (ref 0.0–0.2)
BASO%: 0.8 % (ref 0.0–2.0)
EOS%: 2.1 % (ref 0.0–7.0)
Eosinophils Absolute: 0.1 10*3/uL (ref 0.0–0.5)
HCT: 42.1 % (ref 34.8–46.6)
HGB: 14.2 g/dL (ref 11.6–15.9)
LYMPH#: 1.4 10*3/uL (ref 0.9–3.3)
LYMPH%: 21.5 % (ref 14.0–48.0)
MCH: 28 pg (ref 26.0–34.0)
MCHC: 33.7 g/dL (ref 32.0–36.0)
MCV: 83 fL (ref 81–101)
MONO#: 0.3 10*3/uL (ref 0.1–0.9)
MONO%: 3.8 % (ref 0.0–13.0)
NEUT#: 4.8 10*3/uL (ref 1.5–6.5)
NEUT%: 71.8 % (ref 39.6–80.0)
Platelets: 272 10*3/uL (ref 145–400)
RBC: 5.07 10*6/uL (ref 3.70–5.32)
RDW: 19.7 % — ABNORMAL HIGH (ref 11.1–15.7)
WBC: 6.6 10*3/uL (ref 3.9–10.0)

## 2012-04-22 LAB — IRON AND TIBC
%SAT: 31 % (ref 20–55)
Iron: 91 ug/dL (ref 42–145)
TIBC: 292 ug/dL (ref 250–470)
UIBC: 201 ug/dL (ref 125–400)

## 2012-04-22 LAB — CHCC SATELLITE - SMEAR

## 2012-04-22 LAB — RETICULOCYTES (CHCC)
ABS Retic: 30.4 10*3/uL (ref 19.0–186.0)
RBC.: 5.07 MIL/uL (ref 3.87–5.11)
Retic Ct Pct: 0.6 % (ref 0.4–2.3)

## 2012-04-22 LAB — FERRITIN: Ferritin: 452 ng/mL — ABNORMAL HIGH (ref 10–291)

## 2012-04-22 MED ORDER — AMPHETAMINE-DEXTROAMPHETAMINE 10 MG PO TABS: 10.0000 mg | ORAL_TABLET | Freq: Four times a day (QID) | ORAL | Status: DC

## 2012-04-22 NOTE — Telephone Encounter (Signed)
Rx for Adderall printed for provider signature; does pt need to continue potassium/SLS

## 2012-04-22 NOTE — Telephone Encounter (Signed)
Rx picked-up, will continue to Hold potassium until further labs per Vo, TWH; pt reports d/c of Iron per OV with Dr. Twanna Hy 11.25.13/SLS

## 2012-04-22 NOTE — Progress Notes (Signed)
This office note has been dictated.

## 2012-04-23 NOTE — Progress Notes (Signed)
CC:   Julia Arbour, MD Charlcie Cradle. Delford Field, MD, FCCP  DIAGNOSES: 1. Iron deficiency anemia. 2. Chronic inflammatory demyelinating polyneuropathy. 3. Epilepsy. 4. Complex regional pain syndrome.  CURRENT THERAPY:  The patient is status post IV iron with Feraheme on 03/13/2012.  INTERIM HISTORY:  [name] comes in for her second office visit.  We first saw her back in September.  At that point in time, we found that she was iron deficient.  Her iron studies at that time showed a ferritin of only 18.  Her iron saturation was 33%.  I felt that she would definitely benefit from IV iron.  She got iron.  She felt a little bit better.  She is still bothered quite a bit by arthritis.  She had injections into her I think left hand earlier today.  She has issues with her bowels and bladder.  This CIDP has been an issue for her.  From what they tell me, there is a question of her having multiple sclerosis.  She does see her neurologist soon.  She does have gastroparesis.  She says that her vagus nerve is not working well.  Again, when we first saw her the only thing that we found out was unusual was her low iron.  We did myeloma studies on her.  Everything came back negative for any monoclonal protein in her serum.  She has fairly decent renal functioning.  PHYSICAL EXAMINATION:  General:  This is a petite white female in no obvious distress.  Vital signs:  Show temperature of 98, pulse 113, respiratory rate 16, blood pressure 143/81.  Weight is 120.  Head and neck:  Shows a normocephalic, atraumatic skull.  There are no ocular or oral lesions.  There are no palpable cervical or supraclavicular lymph nodes.  Lungs:  Clear bilaterally.  Cardiac:  Regular rate and rhythm with a normal S1 and S2.  There are no murmurs, rubs or bruits. Abdomen:  Soft with good bowel sounds.  There is no palpable abdominal mass.  There is no palpable abdominal mass.  There is.  Back:  Shows no tenderness  over the spine, ribs or hips.  Extremities:  Do show some arthritic changes in her joints.  LABORATORY STUDIES:  Show a white cell count of 6.6, hemoglobin 14.2, hematocrit 42.1, platelet count 272.  MCV is 83.  IMPRESSION:  [name] is a 78 year old white female with multiple medical issues.  Her biggest issue clinically is this CIDP (chronic inflammatory demyelinating polyneuropathy).  She is going to see her neurologist about this.  She will ask her neurologist about the use of Rituxan.  If he thinks that this is reasonable, we can certainly do that in the office.  I am just glad that her anemia is better.  Hopefully this will translate out to a better quality of life for her.  I think we can probably get her back in about 3 months now.  Her blood has responded nicely to the iron that we gave her.  Her renal function is okay.  We will see what her iron studies are.  I suppose that it is possible she may need another dose of iron.    ______________________________ Josph Macho, M.D. PRE/MEDQ  D:  04/22/2012  T:  04/23/2012  Job:  [id number]

## 2012-04-29 ENCOUNTER — Ambulatory Visit (INDEPENDENT_AMBULATORY_CARE_PROVIDER_SITE_OTHER): Payer: Medicare Other | Admitting: Internal Medicine

## 2012-04-29 ENCOUNTER — Encounter: Payer: Self-pay | Admitting: Internal Medicine

## 2012-04-29 VITALS — BP 138/72 | HR 97 | Temp 98.0°F | Resp 16 | Wt 121.5 lb

## 2012-04-29 DIAGNOSIS — M810 Age-related osteoporosis without current pathological fracture: Secondary | ICD-10-CM

## 2012-05-04 NOTE — Assessment & Plan Note (Signed)
Next visit consider repeat BMD for monitoring of active therapy

## 2012-05-04 NOTE — Progress Notes (Signed)
Subjective:    Patient ID: [name], female    DOB: July 18, 1932, 78 y.o.   MRN: [id number]  HPI Pt presents to clinic for followup of multiple medical problems. Using forteo <1 year and tolerating. Last bmd <2 year ago. Seeing h/o with anemia and hgb improved. Undergoing tx for cidp under direction of neurology. Total time of visit ~28 minutes of which >50% of time spent in counseling.  Past Medical History  Diagnosis Date  . Anemia   . Anxiety     patient denied  . Depression     patient denied  . Asthma   . Chronic fatigue syndrome   . COPD (chronic obstructive pulmonary disease)   . GERD (gastroesophageal reflux disease)   . Hyperlipidemia   . Hypertension   . Colon polyp   . Allergic rhinitis   . Vitamin D deficiency   . Pulmonary embolism   . Pneumonia   . Neuropathy   . Arthritis   . 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 Blood transfusion   . Osteoporosis   . Thyroid disease   . Constipation   . Weakness   . Family history of anesthesia complication     Daughter with history of malignant hyperthermia  . Malignant hyperthermia     daughter   . Seizures     1990 last seizures on meds Phenobarb  . Skin cancer     squamous cell  . Neurogenic bladder 2013    husband caths pt   Past Surgical History  Procedure Date  . Tonsillectomy and adenoidectomy   . Nasal septum surgery   . Dilation and curettage of uterus   . Tubal ligation   . Cesarean section   . Sinus surgeries     x 4  . Left ovary and tube removed   . Vocal polyps removed   . Appendectomy   . Cystocele repair   . Carpal tunnel release     right  . Shoulder arthroscopy     x2 left, 1 right  . Left finger fusion   . Right median nerve decompression   . Duptyren's contracture right hand   . Finger ganglion cyst excision     right  . Abdominal hysterectomy   . Joint replacement     right and left basal joints of thumbs  . Bladder suspension   . Cataract extraction      bilateral  . Cervical neck ablation     x 7, C3-C4  . Squamous lesions removed     neck and face  . Basal cell carcinoma excision     face  . Panniculectomy     reports that she quit smoking about 33 years ago. Her smoking use included Cigarettes. She has a 1.5 pack-year smoking history. She has never used smokeless tobacco. She reports that she does not drink alcohol or use illicit drugs. family history includes Allergies in her sister; Cancer in her cousin, father, and sisters; Colon cancer in her father; Heart disease in her mother; Hyperlipidemia in her maternal aunt and mother; Lung cancer in her sister; Ovarian cancer in her sister; Stomach cancer in an unspecified family member; and Thyroid cancer in her sister. Allergies  Allergen Reactions  . Iodine Other (See Comments)    Cardiac arrest  . Morphine And Related Other (See Comments)    Cardiac arrest      Review of Systems see hpi     Objective:   Physical Exam  Nursing note and vitals reviewed. Constitutional: She appears well-developed and well-nourished. No distress.  HENT:  Head: Normocephalic and atraumatic.  Neurological: She is alert.  Skin: She is not diaphoretic.  Psychiatric: She has a normal mood and affect.          Assessment & Plan:

## 2012-05-06 ENCOUNTER — Encounter: Payer: Self-pay | Admitting: Internal Medicine

## 2012-05-06 ENCOUNTER — Other Ambulatory Visit: Payer: Self-pay | Admitting: Internal Medicine

## 2012-05-06 ENCOUNTER — Ambulatory Visit (INDEPENDENT_AMBULATORY_CARE_PROVIDER_SITE_OTHER): Payer: Medicare Other | Admitting: Internal Medicine

## 2012-05-06 ENCOUNTER — Other Ambulatory Visit: Payer: Self-pay | Admitting: *Deleted

## 2012-05-06 VITALS — BP 140/82 | HR 80 | Ht 62.0 in | Wt 122.8 lb

## 2012-05-06 DIAGNOSIS — D649 Anemia, unspecified: Secondary | ICD-10-CM

## 2012-05-06 DIAGNOSIS — K5900 Constipation, unspecified: Secondary | ICD-10-CM

## 2012-05-06 DIAGNOSIS — Z860101 Personal history of adenomatous and serrated colon polyps: Secondary | ICD-10-CM

## 2012-05-06 DIAGNOSIS — K3184 Gastroparesis: Secondary | ICD-10-CM | POA: Insufficient documentation

## 2012-05-06 DIAGNOSIS — Z86010 Personal history of colonic polyps: Secondary | ICD-10-CM

## 2012-05-06 DIAGNOSIS — R159 Full incontinence of feces: Secondary | ICD-10-CM

## 2012-05-06 MED ORDER — ONDANSETRON 4 MG PO TBDP: 4.0000 mg | ORAL_TABLET | Freq: Three times a day (TID) | ORAL | Status: DC | PRN

## 2012-05-06 MED ORDER — METOCLOPRAMIDE HCL 5 MG PO TABS: 5.0000 mg | ORAL_TABLET | Freq: Two times a day (BID) | ORAL | Status: DC

## 2012-05-06 MED ORDER — POLYETHYLENE GLYCOL 3350 17 G PO PACK: 17.0000 g | PACK | Freq: Every day | ORAL | Status: DC

## 2012-05-06 MED ORDER — PHENOBARBITAL 32.4 MG PO TABS: 32.4000 mg | ORAL_TABLET | Freq: Three times a day (TID) | ORAL | Status: DC

## 2012-05-06 NOTE — Telephone Encounter (Signed)
Called in verbally to [facility]

## 2012-05-06 NOTE — Telephone Encounter (Signed)
Ok to fill as she is taking

## 2012-05-06 NOTE — Patient Instructions (Addendum)
Continue taking current medications Zofran, Miralax daily and Reglan as needed when Zofran is not working  Follow up with Dr. Rhea Belton in Clinic in April 2014

## 2012-05-06 NOTE — Progress Notes (Signed)
Subjective:    Patient ID: [name], female    DOB: 1932/10/07, 78 y.o.   MRN: [id number]  HPI [name] is a 78 yo female with complicated PMH of CIDP, COPD, anemia, GERD, hypertension, hyperlipidemia, rectocele, adenomatous colon polyps status post right hemicolectomy for large sessile serrated adenoma, and gastroparesis who seen for followup. [name] is accompanied today by her husband.  Overall she is doing okay and seems to be managing quite well. She continues to have intermittent nausea with dry heaves, and this tends to happen after over eating. This is happening infrequently sometimes once a week or less. Her nausea is rapidly relieved with Zofran 4-8 mg sublingually.  She is not need to use the Reglan, and remains somewhat hesitant given the neurologic side effects we have previously discussed. She is trying to follow very strict gastroparesis/low residue diet. She's not having abdominal pain currently. Occasionally she will have left lower corner pain when she has not had a bowel movement. She is using MiraLAX 17 g on most days to help with her constipation. Occasionally she does have fecal incontinence, and she reports feeling no urge to defecate. She has sometimes avoided MiraLAX dosing and then has issues with feeling constipated and left lower quadrant pain. No blood in her stool or melena. Her lower extremity muscle cramps of improved and resolved with the cessation of caffeine. She has avoided coffee and this is helped significantly. She is using Gatorade to help with hydration. She has started intermittent self-catheterization 3-4 times daily given her atonic bladder. She has recently been seen by neurology and has plans for a second opinion at a tertiary center, likely [facility]. Her anemia is being followed by Dr. Myna Hidalgo, and her blood counts have recently been checked and improved greatly after IV iron infusion.   Review of Systems As per history of present illness,  otherwise negative  Current Medications, Allergies, Past Medical History, Past Surgical History, Family History and Social History were reviewed in [employer] record.     Objective:   Physical Exam BP 140/82  Pulse 80  Ht 5\' 2"  (1.575 m)  Wt 122 lb 12.8 oz (55.702 kg)  BMI 22.46 kg/m2 Constitutional: Well-developed and well-nourished. No distress.  HEENT: Normocephalic and atraumatic.  No scleral icterus.  Extremities: changes of arthritis noted bilateral hands  Skin: Skin is warm and dry. No rashes noted.  Psychiatric: Normal mood and affect. Behavior is normal.  CBC    Component Value Date/Time   WBC 6.6 04/22/2012 1415   WBC 5.7 03/05/2012 1643   RBC 4.49 03/05/2012 1643   HGB 14.2 04/22/2012 1415   HGB 11.4* 03/05/2012 1643   HCT 42.1 04/22/2012 1415   HCT 33.8* 03/05/2012 1643   PLT 272 04/22/2012 1415   PLT 294 03/05/2012 1643   MCV 83 04/22/2012 1415   MCV 75.3* 03/05/2012 1643   MCH 28.0 04/22/2012 1415   MCH 25.4* 03/05/2012 1643   MCHC 33.7 04/22/2012 1415   MCHC 33.7 03/05/2012 1643   RDW 19.7* 04/22/2012 1415   RDW 22.5* 03/05/2012 1643   LYMPHSABS 1.4 04/22/2012 1415   LYMPHSABS 1.7 03/05/2012 1643   MONOABS 0.4 03/05/2012 1643   EOSABS 0.1 04/22/2012 1415   EOSABS 0.3 03/05/2012 1643   BASOSABS 0.1 04/22/2012 1415   BASOSABS 0.0 03/05/2012 1643   CMP     Component Value Date/Time   NA 136 01/19/2012 1057   K 4.9 01/19/2012 1057   CL 103  01/19/2012 1057   CO2 24 01/19/2012 1057   GLUCOSE 77 01/19/2012 1057   BUN 17 01/19/2012 1057   CREATININE 0.74 01/19/2012 1057   CREATININE 0.64 12/12/2011 1058   CALCIUM 9.3 01/19/2012 1057   PROT 8.6* 01/05/2012 0954   ALBUMIN 3.6 01/05/2012 0954   AST 21 01/05/2012 0954   ALT 9 01/05/2012 0954   ALKPHOS 87 01/05/2012 0954   BILITOT 0.2* 01/05/2012 0954   GFRNONAA 84* 12/12/2011 1058   GFRAA >90 12/12/2011 1058       Assessment & Plan:  78 yo female with complicated PMH of CIDP, COPD, anemia, GERD,  hypertension, hyperlipidemia, rectocele, adenomatous colon polyps status post right hemicolectomy for large sessile serrated adenoma, and gastroparesis who seen for followup  1. Gastroparesis/nausea -- She remains overall much improved with gastroparesis diet. She is using Zofran but rarely. We have again discussed metoclopramide therapy and I think she can use this as a backup only for symptoms refractory to Zofran. We again discussed the neurologic side effects, including tardive dyskinesia, and the fact that this is very rare and usually only associated with long-term therapy. However, given her history of neurologic problems, I like for her to use this as infrequently as possible and at the lowest dose possible.  2. Hx fundic gland polyp with LGD -- repeat EGD revealed no residual polyp tissue, therefore this polyp is felt to be completely removed. No further EGD planned unless symptoms warrant   3. Hx of large tubular adenoma s/p right hemi-colectomy -- we discussed this again today, and a strict is guidelines she would be due repeat colonoscopy in April 2014.  However, given her multitude of medical issues we will rediscuss this closer to April 2014, and may choose to defer this test. We will discuss this at followup.  Of note, her polyp was tubular adenoma only, and the guidelines for repeat surveillance in one year typically regard to colon malignancy after surgery.  4.  Constipation/fecal incontinence  -- overall her constipation is controlled with MiraLAX and I recommended she continue this therapy. Her fecal incontinence is felt secondary to her neurologic condition with her inability to sense the need for bowel movement. She certainly feels better with regularity and therefore I would like her to continue MiraLAX given her constipation history. Unfortunately, there is likely no great there before her intermittent fecal incontinence at this point.   5.  Anemia -- much improved after IV iron,  this is being followed by hematology. Appreciate their assistance  Return around April 2014

## 2012-05-13 ENCOUNTER — Other Ambulatory Visit: Payer: Self-pay | Admitting: Critical Care Medicine

## 2012-05-14 ENCOUNTER — Encounter: Payer: Self-pay | Admitting: Internal Medicine

## 2012-05-14 ENCOUNTER — Other Ambulatory Visit: Payer: Self-pay | Admitting: Internal Medicine

## 2012-05-14 DIAGNOSIS — M810 Age-related osteoporosis without current pathological fracture: Secondary | ICD-10-CM

## 2012-05-14 DIAGNOSIS — Z79899 Other long term (current) drug therapy: Secondary | ICD-10-CM

## 2012-05-14 MED ORDER — ARFORMOTEROL TARTRATE 15 MCG/2ML IN NEBU: 15.0000 ug | INHALATION_SOLUTION | Freq: Two times a day (BID) | RESPIRATORY_TRACT | Status: DC

## 2012-05-14 NOTE — Telephone Encounter (Signed)
Last OV was in 09/2011, was told to follow up in 2 months. No pending OV. Would you like to refill this?

## 2012-05-14 NOTE — Telephone Encounter (Signed)
Ok to refill x one month.  Needs OV

## 2012-05-14 NOTE — Telephone Encounter (Signed)
Pt notified that rx refill was sent to [facility] and f/u scheduled with dr Enos Fling.

## 2012-05-27 ENCOUNTER — Ambulatory Visit (INDEPENDENT_AMBULATORY_CARE_PROVIDER_SITE_OTHER)
Admission: RE | Admit: 2012-05-27 | Discharge: 2012-05-27 | Disposition: A | Discharge status: Home / Self Care | Payer: Medicare Other | Source: Ambulatory Visit

## 2012-05-27 ENCOUNTER — Other Ambulatory Visit: Payer: Medicare Other

## 2012-05-27 DIAGNOSIS — Z79899 Other long term (current) drug therapy: Secondary | ICD-10-CM

## 2012-05-27 DIAGNOSIS — M810 Age-related osteoporosis without current pathological fracture: Secondary | ICD-10-CM

## 2012-06-12 ENCOUNTER — Encounter: Payer: Self-pay | Admitting: Internal Medicine

## 2012-06-12 NOTE — Telephone Encounter (Signed)
Please see patient's My Chart message & advise. Thanks/SLS 

## 2012-06-13 ENCOUNTER — Ambulatory Visit (INDEPENDENT_AMBULATORY_CARE_PROVIDER_SITE_OTHER): Payer: Medicare Other | Admitting: Critical Care Medicine

## 2012-06-13 ENCOUNTER — Encounter: Payer: Self-pay | Admitting: Critical Care Medicine

## 2012-06-13 VITALS — BP 142/78 | HR 90 | Temp 97.6°F | Ht 62.0 in | Wt 124.0 lb

## 2012-06-13 DIAGNOSIS — J449 Chronic obstructive pulmonary disease, unspecified: Secondary | ICD-10-CM

## 2012-06-13 NOTE — Patient Instructions (Addendum)
No change in medications. Return in    6 months  Consider Drs Rene Paci, Sanda Linger, or Oliver Barre at the [place] office in [place] for primary care

## 2012-06-13 NOTE — Progress Notes (Signed)
Subjective:    Patient ID: [name], female    DOB: January 18, 1933, 79 y.o.   MRN: [id number]  HPI Comments: I   79 y.o. WF  Did not have neck, neurology changed Dx to CIDP,  Peripheral neuropathy.   Neck surgery was put off.  Colon polyp needs to be removed. Dyspnea has been stable.  Takes only two neb meds Rx /day. No real cough.   On pred 65mg /d  Needs laproscopic surgery to remove the colon polyp.  5/2 Pt had cecal adenoma removed Since d/c issues have been: to see urology has neuropathy of bladder.  Pt is self catheterizing and wears diaper.  Pt remains incontinent.   No real cough.  Likes brovana and pulmicort better, could not do dulera.  06/13/2012 Dx CIDP with balance issues.  Now not much mucus.  No real chest pain.  No real wheeze.  Dyspnea is the same, depends on the activity: if use arms will be an issue.  No bowel or bladder control, cannot feel the feet.  [facility] eval 10/2012. [facility] ? If missing     note the patient has successfully reduced prednisone to 20 mg daily   Past Medical History  Diagnosis Date  . Anemia   . Anxiety     patient denied  . Depression     patient denied  . Asthma   . Chronic fatigue syndrome   . COPD (chronic obstructive pulmonary disease)   . GERD (gastroesophageal reflux disease)   . Hyperlipidemia   . Hypertension   . Colon polyp   . Allergic rhinitis   . Vitamin D deficiency   . Pulmonary embolism   . Pneumonia   . Neuropathy   . Arthritis   . 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 Blood transfusion   . Osteoporosis   . Thyroid disease   . Constipation   . Weakness   . Family history of anesthesia complication     Daughter with history of malignant hyperthermia  . Malignant hyperthermia     daughter   . Seizures     1990 last seizures on meds Phenobarb  . Skin cancer     squamous cell  . Neurogenic bladder 2013    husband caths pt     Family History  Problem Relation Age of Onset  .  Heart disease Mother   . Hyperlipidemia Mother   . Allergies Sister   . Cancer Sister     ovarian  . Ovarian cancer Sister   . Cancer Sister     lung - stage 1  . Thyroid cancer Sister   . Cancer Sister     thyroid - stage 1  . Lung cancer Sister   . Colon cancer Father   . Cancer Father     colon  . Stomach cancer      first cousin  . Hyperlipidemia Maternal Aunt   . Cancer Cousin     stomach     History   Social History  . Marital Status: Married    Spouse Name: N/A    Number of Children: 3  . Years of Education: N/A   Occupational History  . retired    Social History Main Topics  . Smoking status: Former Smoker -- 0.5 packs/day for 3 years    Types: Cigarettes    Quit date: 05/29/1978  . Smokeless tobacco: Never Used  . Alcohol Use: No  . Drug Use: No  . Sexually Active:  Yes   Other Topics Concern  . Not on file   Social History Narrative  . No narrative on file     Allergies  Allergen Reactions  . Iodine Other (See Comments)    Cardiac arrest  . Morphine And Related Other (See Comments)    Cardiac arrest     Outpatient Prescriptions Prior to Visit  Medication Sig Dispense Refill  . albuterol (PROAIR HFA) 108 (90 BASE) MCG/ACT inhaler Inhale 2 puffs into the lungs every 4 (four) hours as needed. For shortness of breath.      Marland Kitchen amLODipine (NORVASC) 10 MG tablet Take 1 tablet (10 mg total) by mouth every morning.  30 tablet  5  . arformoterol (BROVANA) 15 MCG/2ML NEBU Take 2 mLs (15 mcg total) by nebulization 2 (two) times daily.  120 mL  1  . budesonide (PULMICORT) 0.25 MG/2ML nebulizer solution Take 2 mLs (0.25 mg total) by nebulization 2 (two) times daily.  60 mL  3  . CALCIUM-VITAMIN D PO Take 1 tablet by mouth daily.       . cetirizine (ZYRTEC) 10 MG tablet Take 10 mg by mouth daily.        Marland Kitchen DEXILANT 60 MG capsule Take 60 mg by mouth Daily.       . diclofenac sodium (VOLTAREN) 1 % GEL Apply 1 application topically 4 (four) times daily as  needed. Applies to fingers and hands for gout.      Marland Kitchen HYDROcodone-homatropine (HYCODAN) 5-1.5 MG/5ML syrup Take 5 mLs by mouth as needed. For cough.      . lidocaine (LIDODERM) 5 % Place 2 patches onto the skin every morning. Remove & Discard patch within 12 hours or as directed by MD. Applies to back and neck.  60 patch  6  . mometasone (NASONEX) 50 MCG/ACT nasal spray Place 2 sprays into the nose daily.        . montelukast (SINGULAIR) 10 MG tablet Take 10 mg by mouth at bedtime.      . ondansetron (ZOFRAN-ODT) 4 MG disintegrating tablet Take 1 tablet (4 mg total) by mouth every 8 (eight) hours as needed for nausea.  30 tablet  0  . oxyCODONE-acetaminophen (PERCOCET) 10-325 MG per tablet Take 1 tablet by mouth every 4 (four) hours as needed for pain. For breakthrough pain. Stated she hasn't had to use since starting on Oxycontin 12h  60 tablet  0  . oxymorphone (OPANA ER) 20 MG 12 hr tablet Take 20 mg by mouth every 12 (twelve) hours.      Marland Kitchen PHENobarbital (LUMINAL) 32.4 MG tablet Take 1-2 tablets (32.4-64.8 mg total) by mouth 4 (four) times daily -  with meals and at bedtime. She takes one tablet at 6 am, one tablet at noon and two tabletss at 8 pm.  120 tablet  5  . polyethylene glycol (MIRALAX / GLYCOLAX) packet Take 17 g by mouth at bedtime.  14 each    . predniSONE (DELTASONE) 20 MG tablet Take 20 mg by mouth daily.      . simvastatin (ZOCOR) 40 MG tablet Take 1 tablet (40 mg total) by mouth at bedtime.  30 tablet  3  . theophylline (THEODUR) 200 MG 12 hr tablet Two by mouth twice a day  60 tablet  6  . Vitamin D, Ergocalciferol, (DRISDOL) 50000 UNITS CAPS TAKE 1 CAPSULE EVERY WEEK  5 capsule  5  . [DISCONTINUED] cyclobenzaprine (FLEXERIL) 5 MG tablet TAKE ONE TABLET DAILY AT BEDTIME FOR  MUSCLE SPASM  60 tablet  0  . [DISCONTINUED] amphetamine-dextroamphetamine (ADDERALL) 10 MG tablet Take 1 tablet (10 mg total) by mouth 4 (four) times daily.  120 tablet  0  . [DISCONTINUED] magnesium oxide  (MAG-OX) 400 MG tablet Take 400 mg by mouth 2 (two) times daily.       . [DISCONTINUED] metoCLOPramide (REGLAN) 5 MG tablet Take 1 tablet (5 mg total) by mouth 2 (two) times daily. As of 04-22-12 has not yet started.  20 tablet  1  Last reviewed on 06/13/2012  3:06 PM by Storm Frisk, MD   Review of Systems  Constitutional: Negative for chills, diaphoresis, activity change, appetite change, fatigue and unexpected weight change.  HENT: Positive for hearing loss, congestion and neck stiffness. Negative for nosebleeds, facial swelling, sneezing, mouth sores, trouble swallowing, dental problem, voice change, postnasal drip, sinus pressure, tinnitus and ear discharge.   Eyes: Negative for photophobia, discharge, itching and visual disturbance.  Respiratory: Positive for cough and chest tightness. Negative for apnea, choking and stridor.   Cardiovascular: Negative for palpitations.  Gastrointestinal: Positive for constipation and blood in stool. Negative for nausea and abdominal distention.       Hx of rectocoele   Genitourinary: Positive for urgency, frequency and decreased urine volume. Negative for dysuria, hematuria, flank pain and difficulty urinating.  Musculoskeletal: Positive for back pain, joint swelling, arthralgias and gait problem. Negative for myalgias.  Skin: Negative for color change and pallor.  Neurological: Positive for weakness and numbness. Negative for dizziness, tremors, seizures, syncope, speech difficulty and light-headedness.  Hematological: Negative for adenopathy. Bruises/bleeds easily.  Psychiatric/Behavioral: Negative for confusion, sleep disturbance and agitation. The patient is not nervous/anxious.        Objective:   Physical Exam  Filed Vitals:   06/13/12 1456  BP: 142/78  Pulse: 90  Temp: 97.6 F (36.4 C)  TempSrc: Oral  Height: 5\' 2"  (1.575 m)  Weight: 124 lb (56.246 kg)  SpO2: 97%    Gen: Pleasant, well-nourished, in no distress,  normal  affect  ENT: No lesions,  mouth clear,  oropharynx clear, no postnasal drip  Neck: No JVD, no TMG, no carotid bruits  Lungs: No use of accessory muscles, no dullness to percussion, distant BS  Cardiovascular: RRR, heart sounds normal, no murmur or gallops, no peripheral edema  Abdomen: soft and NT, no HSM,  BS normal  Musculoskeletal: No deformities, no cyanosis or clubbing  Neuro: alert, non focal  Skin: Warm, no lesions or rashes     Assessment & Plan:   COPD (chronic obstructive pulmonary disease) Chronic obstructive lung disease gold stage D. not oxygen dependent but steroid dependent in stable at this time Significant lifelong asthmatic atopic features Patient was not able tolerate HFA device and now is on bronchodilators via nebulizer  with significant improvement in lung function Plan Maintain nebulized therapy as prescribed Return 6 months    Updated Medication List Outpatient Encounter Prescriptions as of 06/13/2012  Medication Sig Dispense Refill  . albuterol (PROAIR HFA) 108 (90 BASE) MCG/ACT inhaler Inhale 2 puffs into the lungs every 4 (four) hours as needed. For shortness of breath.      Marland Kitchen amLODipine (NORVASC) 10 MG tablet Take 1 tablet (10 mg total) by mouth every morning.  30 tablet  5  . arformoterol (BROVANA) 15 MCG/2ML NEBU Take 2 mLs (15 mcg total) by nebulization 2 (two) times daily.  120 mL  1  . budesonide (PULMICORT) 0.25 MG/2ML nebulizer solution Take 2 mLs (  0.25 mg total) by nebulization 2 (two) times daily.  60 mL  3  . CALCIUM-VITAMIN D PO Take 1 tablet by mouth daily.       . cetirizine (ZYRTEC) 10 MG tablet Take 10 mg by mouth daily.        Marland Kitchen DEXILANT 60 MG capsule Take 60 mg by mouth Daily.       . diclofenac sodium (VOLTAREN) 1 % GEL Apply 1 application topically 4 (four) times daily as needed. Applies to fingers and hands for gout.      Marland Kitchen HYDROcodone-homatropine (HYCODAN) 5-1.5 MG/5ML syrup Take 5 mLs by mouth as needed. For cough.      .  lidocaine (LIDODERM) 5 % Place 2 patches onto the skin every morning. Remove & Discard patch within 12 hours or as directed by MD. Applies to back and neck.  60 patch  6  . mometasone (NASONEX) 50 MCG/ACT nasal spray Place 2 sprays into the nose daily.        . montelukast (SINGULAIR) 10 MG tablet Take 10 mg by mouth at bedtime.      . ondansetron (ZOFRAN-ODT) 4 MG disintegrating tablet Take 1 tablet (4 mg total) by mouth every 8 (eight) hours as needed for nausea.  30 tablet  0  . oxyCODONE-acetaminophen (PERCOCET) 10-325 MG per tablet Take 1 tablet by mouth every 4 (four) hours as needed for pain. For breakthrough pain. Stated she hasn't had to use since starting on Oxycontin 12h  60 tablet  0  . oxymorphone (OPANA ER) 20 MG 12 hr tablet Take 20 mg by mouth every 12 (twelve) hours.      Marland Kitchen PHENobarbital (LUMINAL) 32.4 MG tablet Take 1-2 tablets (32.4-64.8 mg total) by mouth 4 (four) times daily -  with meals and at bedtime. She takes one tablet at 6 am, one tablet at noon and two tabletss at 8 pm.  120 tablet  5  . polyethylene glycol (MIRALAX / GLYCOLAX) packet Take 17 g by mouth at bedtime.  14 each    . predniSONE (DELTASONE) 20 MG tablet Take 20 mg by mouth daily.      . simvastatin (ZOCOR) 40 MG tablet Take 1 tablet (40 mg total) by mouth at bedtime.  30 tablet  3  . Teriparatide, Recombinant, (FORTEO [place]) Inject into the skin daily.      . theophylline (THEODUR) 200 MG 12 hr tablet Two by mouth twice a day  60 tablet  6  . Vitamin D, Ergocalciferol, (DRISDOL) 50000 UNITS CAPS TAKE 1 CAPSULE EVERY WEEK  5 capsule  5  . [DISCONTINUED] cyclobenzaprine (FLEXERIL) 5 MG tablet TAKE ONE TABLET DAILY AT BEDTIME FOR    MUSCLE SPASM  60 tablet  0  . [DISCONTINUED] cyclobenzaprine (FLEXERIL) 5 MG tablet as needed.       . [DISCONTINUED] amphetamine-dextroamphetamine (ADDERALL) 10 MG tablet Take 1 tablet (10 mg total) by mouth 4 (four) times daily.  120 tablet  0  . [DISCONTINUED] magnesium oxide (MAG-OX)  400 MG tablet Take 400 mg by mouth 2 (two) times daily.       . [DISCONTINUED] metoCLOPramide (REGLAN) 5 MG tablet Take 1 tablet (5 mg total) by mouth 2 (two) times daily. As of 04-22-12 has not yet started.  20 tablet  1  . [DISCONTINUED] metoCLOPramide (REGLAN) 5 MG tablet ON HOLD IF NEEDED

## 2012-06-13 NOTE — Telephone Encounter (Signed)
Message [from provider]    The results just showed up in my box. i'll review

## 2012-06-14 ENCOUNTER — Encounter: Payer: Self-pay | Admitting: Internal Medicine

## 2012-06-14 NOTE — Assessment & Plan Note (Signed)
Chronic obstructive lung disease gold stage D. not oxygen dependent but steroid dependent in stable at this time Significant lifelong asthmatic atopic features Patient was not able tolerate HFA device and now is on bronchodilators via nebulizer  with significant improvement in lung function Plan Maintain nebulized therapy as prescribed Return 6 months

## 2012-06-17 ENCOUNTER — Other Ambulatory Visit: Payer: Self-pay | Admitting: Gastroenterology

## 2012-06-17 MED ORDER — DEXLANSOPRAZOLE 60 MG PO CPDR: 60.0000 mg | DELAYED_RELEASE_CAPSULE | Freq: Every day | ORAL | Status: DC

## 2012-06-18 ENCOUNTER — Encounter: Payer: Self-pay | Admitting: Internal Medicine

## 2012-06-18 ENCOUNTER — Telehealth: Payer: Self-pay | Admitting: *Deleted

## 2012-06-18 ENCOUNTER — Ambulatory Visit: Payer: Medicare Other | Admitting: Family

## 2012-06-18 ENCOUNTER — Other Ambulatory Visit: Payer: Self-pay

## 2012-06-18 MED ORDER — BACLOFEN 10 MG PO TABS: 10.0000 mg | ORAL_TABLET | Freq: Every day | ORAL | Status: DC

## 2012-06-18 NOTE — Telephone Encounter (Signed)
[address] [place], [place] [address] p. [phone] f. [phone] To: Lorrin Mais Fax: [phone] From: [facility] Date/ Time: 06/17/2012 1:10 PM Taken By: Crissie Figures, CSR Caller: Britta Mccreedy Facility: not collected Patient: [name], [name] DOB: 25-Mar-1933 Phone: [phone] Reason for Call: See info below Regarding Appointment: Yes Appt Date: 06/18/2012 Appt Time: 2:30:00 PM Provider: Sandford Craze (Adults only) Reason: Cancel Appointment Details: does not want to see this provider. Is going to she with another Dr. at [facility] Outcome: Cancelled appointment in EPIC [facility])

## 2012-06-18 NOTE — Telephone Encounter (Signed)
[employer] sent paperwork stating that pt must try Baclofen or Tizanidine before Flexeril.   Verbal per md send in Baclofen 10 mg tabs with same signature as Flexeril

## 2012-06-24 ENCOUNTER — Encounter: Payer: Self-pay | Admitting: Internal Medicine

## 2012-06-24 NOTE — Telephone Encounter (Signed)
I sent a reply back to patient stating that the insurance wouldn't pay for Flexeril until pt tried Baclofen or Tizanidine.  I also informed pt that I would send the message to you to see about setting up a new patient appt. Please advise

## 2012-06-27 ENCOUNTER — Encounter: Payer: Self-pay | Admitting: Family Medicine

## 2012-06-27 ENCOUNTER — Ambulatory Visit (INDEPENDENT_AMBULATORY_CARE_PROVIDER_SITE_OTHER): Payer: Medicare Other | Admitting: Family Medicine

## 2012-06-27 VITALS — BP 150/82 | HR 90 | Temp 97.8°F | Ht 62.0 in | Wt 126.1 lb

## 2012-06-27 DIAGNOSIS — I10 Essential (primary) hypertension: Secondary | ICD-10-CM

## 2012-06-27 DIAGNOSIS — R569 Unspecified convulsions: Secondary | ICD-10-CM

## 2012-06-27 DIAGNOSIS — G6181 Chronic inflammatory demyelinating polyneuritis: Secondary | ICD-10-CM

## 2012-06-27 DIAGNOSIS — D649 Anemia, unspecified: Secondary | ICD-10-CM

## 2012-06-27 DIAGNOSIS — K3184 Gastroparesis: Secondary | ICD-10-CM

## 2012-06-27 DIAGNOSIS — R5383 Other fatigue: Secondary | ICD-10-CM

## 2012-06-27 DIAGNOSIS — R252 Cramp and spasm: Secondary | ICD-10-CM

## 2012-06-27 DIAGNOSIS — R5381 Other malaise: Secondary | ICD-10-CM

## 2012-06-27 DIAGNOSIS — G4710 Hypersomnia, unspecified: Secondary | ICD-10-CM

## 2012-06-27 LAB — MAGNESIUM: Magnesium: 2.1 mg/dL (ref 1.5–2.5)

## 2012-06-27 LAB — RENAL FUNCTION PANEL
Albumin: 4.6 g/dL (ref 3.5–5.2)
BUN: 20 mg/dL (ref 6–23)
CO2: 27 mEq/L (ref 19–32)
Calcium: 9.7 mg/dL (ref 8.4–10.5)
Chloride: 106 mEq/L (ref 96–112)
Creat: 0.78 mg/dL (ref 0.50–1.10)
Glucose, Bld: 75 mg/dL (ref 70–99)
Phosphorus: 3.9 mg/dL (ref 2.3–4.6)
Potassium: 5 mEq/L (ref 3.5–5.3)
Sodium: 143 mEq/L (ref 135–145)

## 2012-06-27 LAB — CBC
HCT: 40 % (ref 36.0–46.0)
Hemoglobin: 13.8 g/dL (ref 12.0–15.0)
MCH: 30.1 pg (ref 26.0–34.0)
MCHC: 34.5 g/dL (ref 30.0–36.0)
MCV: 87.1 fL (ref 78.0–100.0)
Platelets: 270 10*3/uL (ref 150–400)
RBC: 4.59 MIL/uL (ref 3.87–5.11)
RDW: 14.4 % (ref 11.5–15.5)
WBC: 6 10*3/uL (ref 4.0–10.5)

## 2012-06-27 LAB — TSH: TSH: 3.257 u[IU]/mL (ref 0.350–4.500)

## 2012-06-27 LAB — T4, FREE: Free T4: 0.69 ng/dL — ABNORMAL LOW (ref 0.80–1.80)

## 2012-06-27 LAB — SEDIMENTATION RATE: Sed Rate: 7 mm/hr (ref 0–22)

## 2012-06-27 MED ORDER — AMPHETAMINE-DEXTROAMPHETAMINE 10 MG PO TABS: 10.0000 mg | ORAL_TABLET | Freq: Four times a day (QID) | ORAL | Status: DC

## 2012-06-27 NOTE — Assessment & Plan Note (Signed)
Suppressed T4 found today will initiate treatment

## 2012-06-27 NOTE — Assessment & Plan Note (Signed)
No recent activity check phenobarb level due to symptoms.

## 2012-06-27 NOTE — Assessment & Plan Note (Signed)
And now with increased stool incontinence.

## 2012-06-27 NOTE — Progress Notes (Signed)
Patient ID: [name], female   DOB: Oct 02, 1932, 79 y.o.   MRN: [id number] GREYDIS [place] [id number] March 06, 1933 06/27/2012      Progress Note-Follow Up  Subjective  Chief Complaint  Chief Complaint  Patient presents with  . Follow-up    HPI  Patient is a 79 year old Caucasian female who is in today concerned about excessive fatigue. She is a very competent past medical history and sees numerous physicians. She struggled with severe fatigue in the past. She's been diagnosed with chronic fatigue syndrome and a renal insufficiency. She has CIDP, incontinence, asthma, gastroparesis and other chronic conditions. Chest neurogenic bladder and the catheter every 2 hours. Reports her fatigue is debilitating and her husband is with her today to confirm. She sleeps a good 20 hours a day and this is beginning worse over several months. She has just recently restarted Adderall because of the excessive fatigue but is unsure if it is helping. Denies any recent acute illness. No fevers or chills. No worsening congestion or shortness of breath. No chest pain or palpitations  Past Medical History  Diagnosis Date  . Anemia   . Anxiety     patient denied  . Depression     patient denied  . Asthma   . Chronic fatigue syndrome   . COPD (chronic obstructive pulmonary disease)   . GERD (gastroesophageal reflux disease)   . Hyperlipidemia   . Hypertension   . Colon polyp   . Allergic rhinitis   . Vitamin D deficiency   . Pulmonary embolism   . Pneumonia   . Neuropathy   . Arthritis   . 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 Blood transfusion   . Osteoporosis   . Thyroid disease   . Constipation   . Weakness   . Family history of anesthesia complication     Daughter with history of malignant hyperthermia  . Malignant hyperthermia     daughter   . Seizures     1990 last seizures on meds Phenobarb  . Skin cancer     squamous cell  . Neurogenic bladder 2013     husband caths pt    Past Surgical History  Procedure Date  . Tonsillectomy and adenoidectomy   . Nasal septum surgery   . Dilation and curettage of uterus   . Tubal ligation   . Cesarean section   . Sinus surgeries     x 4  . Left ovary and tube removed   . Vocal polyps removed   . Appendectomy   . Cystocele repair   . Carpal tunnel release     right  . Shoulder arthroscopy     x2 left, 1 right  . Left finger fusion   . Right median nerve decompression   . Duptyren's contracture right hand   . Finger ganglion cyst excision     right  . Abdominal hysterectomy   . Joint replacement     right and left basal joints of thumbs  . Bladder suspension   . Cataract extraction     bilateral  . Cervical neck ablation     x 7, C3-C4  . Squamous lesions removed     neck and face  . Basal cell carcinoma excision     face  . Panniculectomy     Family History  Problem Relation Age of Onset  . Heart disease Mother   . Hyperlipidemia Mother   . Allergies Sister   . Cancer Sister  ovarian  . Ovarian cancer Sister   . Cancer Sister     lung - stage 1  . Thyroid cancer Sister   . Cancer Sister     thyroid - stage 1  . Lung cancer Sister   . Colon cancer Father   . Cancer Father     colon  . Stomach cancer      first cousin  . Hyperlipidemia Maternal Aunt   . Cancer Cousin     stomach    History   Social History  . Marital Status: Married    Spouse Name: N/A    Number of Children: 3  . Years of Education: N/A   Occupational History  . retired    Social History Main Topics  . Smoking status: Former Smoker -- 0.5 packs/day for 3 years    Types: Cigarettes    Quit date: 05/29/1978  . Smokeless tobacco: Never Used  . Alcohol Use: No  . Drug Use: No  . Sexually Active: Yes   Other Topics Concern  . Not on file   Social History Narrative  . No narrative on file    Current Outpatient Prescriptions on File Prior to Visit  Medication Sig Dispense Refill   . amLODipine (NORVASC) 10 MG tablet Take 1 tablet (10 mg total) by mouth every morning.  30 tablet  5  . arformoterol (BROVANA) 15 MCG/2ML NEBU Take 2 mLs (15 mcg total) by nebulization 2 (two) times daily.  120 mL  1  . budesonide (PULMICORT) 0.25 MG/2ML nebulizer solution Take 2 mLs (0.25 mg total) by nebulization 2 (two) times daily.  60 mL  3  . CALCIUM-VITAMIN D PO Take 1 tablet by mouth daily.       . cetirizine (ZYRTEC) 10 MG tablet Take 10 mg by mouth daily.        Marland Kitchen dexlansoprazole (DEXILANT) 60 MG capsule Take 1 capsule (60 mg total) by mouth daily.  30 capsule  6  . diclofenac sodium (VOLTAREN) 1 % GEL Apply 1 application topically 4 (four) times daily as needed. Applies to fingers and hands for gout.      Marland Kitchen HYDROcodone-homatropine (HYCODAN) 5-1.5 MG/5ML syrup Take 5 mLs by mouth as needed. For cough.      . lidocaine (LIDODERM) 5 % Place 2 patches onto the skin every morning. Remove & Discard patch within 12 hours or as directed by MD. Applies to back and neck.  60 patch  6  . mometasone (NASONEX) 50 MCG/ACT nasal spray Place 2 sprays into the nose daily.        . montelukast (SINGULAIR) 10 MG tablet Take 10 mg by mouth at bedtime.      . ondansetron (ZOFRAN-ODT) 4 MG disintegrating tablet Take 1 tablet (4 mg total) by mouth every 8 (eight) hours as needed for nausea.  30 tablet  0  . oxyCODONE-acetaminophen (PERCOCET) 10-325 MG per tablet Take 1 tablet by mouth every 4 (four) hours as needed for pain. For breakthrough pain. Stated she hasn't had to use since starting on Oxycontin 12h  60 tablet  0  . oxymorphone (OPANA ER) 20 MG 12 hr tablet Take 20 mg by mouth every 12 (twelve) hours.      Marland Kitchen PHENobarbital (LUMINAL) 32.4 MG tablet Take 1-2 tablets (32.4-64.8 mg total) by mouth 4 (four) times daily -  with meals and at bedtime. She takes one tablet at 6 am, one tablet at noon and two tabletss at 8 pm.  120  tablet  5  . polyethylene glycol (MIRALAX / GLYCOLAX) packet Take 17 g by mouth  at bedtime.  14 each    . predniSONE (DELTASONE) 20 MG tablet Take 20 mg by mouth daily.      . simvastatin (ZOCOR) 40 MG tablet Take 1 tablet (40 mg total) by mouth at bedtime.  30 tablet  3  . Teriparatide, Recombinant, (FORTEO [place]) Inject into the skin daily.      . theophylline (THEODUR) 200 MG 12 hr tablet Two by mouth twice a day  60 tablet  6  . Vitamin D, Ergocalciferol, (DRISDOL) 50000 UNITS CAPS TAKE 1 CAPSULE EVERY WEEK  5 capsule  5    Allergies  Allergen Reactions  . Iodine Other (See Comments)    Cardiac arrest  . Morphine And Related Other (See Comments)    Cardiac arrest    Review of Systems  Review of Systems  Constitutional: Positive for malaise/fatigue. Negative for fever.  HENT: Negative for congestion.   Eyes: Negative for discharge.  Respiratory: Positive for shortness of breath.   Cardiovascular: Negative for chest pain, palpitations and leg swelling.  Gastrointestinal: Positive for heartburn, nausea, vomiting and abdominal pain. Negative for diarrhea.  Genitourinary: Negative for dysuria.  Musculoskeletal: Positive for myalgias. Negative for falls.  Skin: Negative for rash.  Neurological: Positive for dizziness, focal weakness and seizures. Negative for loss of consciousness and headaches.  Endo/Heme/Allergies: Negative for polydipsia.  Psychiatric/Behavioral: Positive for depression. Negative for suicidal ideas. The patient is not nervous/anxious and does not have insomnia.     Objective  BP 150/82  Pulse 90  Temp 97.8 F (36.6 C) (Oral)  Ht 5\' 2"  (1.575 m)  Wt 126 lb 1.3 oz (57.19 kg)  BMI 23.06 kg/m2  SpO2 97%  Physical Exam  Physical Exam  Constitutional: She is oriented to person, place, and time and well-developed, well-nourished, and in no distress. No distress.  HENT:  Head: Normocephalic and atraumatic.  Eyes: Conjunctivae normal are normal.  Neck: Neck supple. No thyromegaly present.  Cardiovascular: Normal rate, regular rhythm and  normal heart sounds.   Pulmonary/Chest: Effort normal and breath sounds normal. She has no wheezes.  Abdominal: She exhibits no distension and no mass.  Musculoskeletal: She exhibits no edema.  Lymphadenopathy:    She has no cervical adenopathy.  Neurological: She is alert and oriented to person, place, and time.  Skin: Skin is warm and dry. No rash noted. She is not diaphoretic.  Psychiatric: Memory, affect and judgment normal.    Lab Results  Component Value Date   TSH 3.257 06/27/2012   Lab Results  Component Value Date   WBC 6.0 06/27/2012   HGB 13.8 06/27/2012   HCT 40.0 06/27/2012   MCV 87.1 06/27/2012   PLT 270 06/27/2012   Lab Results  Component Value Date   CREATININE 0.78 06/27/2012   BUN 20 06/27/2012   NA 143 06/27/2012   K 5.0 06/27/2012   CL 106 06/27/2012   CO2 27 06/27/2012   Lab Results  Component Value Date   ALT 9 01/05/2012   AST 21 01/05/2012   ALKPHOS 87 01/05/2012   BILITOT 0.2* 01/05/2012   Lab Results  Component Value Date   CHOL 157 01/05/2012   Lab Results  Component Value Date   HDL 45 01/05/2012   Lab Results  Component Value Date   LDLCALC 95 01/05/2012   Lab Results  Component Value Date   TRIG 87 01/05/2012   Lab  Results  Component Value Date   CHOLHDL 3.5 01/05/2012     Assessment & Plan  CIDP (chronic inflammatory demyelinating polyneuropathy) Follows with [facility] but has been set up by Neuro for a secondary consultation at [facility] this April  Hypertension Mild elevation today, no change in therapy  Hypersomnia Suppressed T4 found today will initiate treatment  Seizures No recent activity check phenobarb level due to symptoms.  Gastroparesis And now with increased stool incontinence.  Anemia Resolved at current time

## 2012-06-27 NOTE — Assessment & Plan Note (Signed)
Follows with [facility] but has been set up by Neuro for a secondary consultation at [facility] this April

## 2012-06-27 NOTE — Patient Instructions (Addendum)

## 2012-06-27 NOTE — Assessment & Plan Note (Signed)
Mild elevation today, no change in therapy

## 2012-06-27 NOTE — Assessment & Plan Note (Signed)
Resolved at current time

## 2012-06-27 NOTE — Assessment & Plan Note (Signed)
>>  ASSESSMENT AND PLAN FOR GASTROPARESIS WRITTEN ON 06/27/2012  9:39 PM BY BLYTH, STACEY A, MD  And now with increased stool incontinence.

## 2012-06-28 ENCOUNTER — Encounter: Payer: Self-pay | Admitting: Family Medicine

## 2012-06-28 ENCOUNTER — Telehealth: Payer: Self-pay | Admitting: Internal Medicine

## 2012-06-28 LAB — PHENOBARBITAL LEVEL: Phenobarbital: 30.6 ug/mL (ref 15.0–40.0)

## 2012-06-28 MED ORDER — LEVOTHYROXINE SODIUM 25 MCG PO TABS: 25.0000 ug | ORAL_TABLET | Freq: Every day | ORAL | Status: DC

## 2012-06-28 MED ORDER — MOMETASONE FUROATE 50 MCG/ACT NA SUSP: 2.0000 | Freq: Every day | NASAL | Status: DC

## 2012-06-28 NOTE — Telephone Encounter (Signed)
lmom for pt to call back

## 2012-06-28 NOTE — Progress Notes (Signed)
Quick Note:  Patient Informed and voiced understanding ______ 

## 2012-06-28 NOTE — Addendum Note (Signed)
Addended by: Court Joy on: 06/28/2012 11:45 AM   Modules accepted: Orders

## 2012-06-29 ENCOUNTER — Encounter: Payer: Self-pay | Admitting: Family Medicine

## 2012-07-01 NOTE — Telephone Encounter (Signed)
Change in color by itself is not too worrisome. If she develops diarrhea, abd pain, fever, then stools studies should be done. Would monitor and notify us of any bleeding, black tarry stool or pain Thanks

## 2012-07-01 NOTE — Telephone Encounter (Signed)
Informed pt of Dr Lauro Franklin recommendations/advice. She will call if those s&s develop or for other questions.

## 2012-07-01 NOTE — Telephone Encounter (Signed)
Pt reports avocado colored stools for > 1 week. She has not been on an AB and her diet remains unchanged and she doesn't have a BM w/o Miralax daily. She also saw her PCP because she sleeps almost all the time; her thyroid labs were abnormal and she just started synthroid; after the stool changed color. Explained to pt Dr Rhea Belton is off and I will ask him tomorrow about labs or an OV; pt stated understanding. Please advise.

## 2012-07-02 ENCOUNTER — Telehealth: Payer: Self-pay | Admitting: Gastroenterology

## 2012-07-02 ENCOUNTER — Encounter: Payer: Self-pay | Admitting: Family Medicine

## 2012-07-02 NOTE — Telephone Encounter (Signed)
Please advise 

## 2012-07-02 NOTE — Telephone Encounter (Signed)
Called pt to let her know Dr. Rhea Belton received her letter asking for help in getting her Rx for Dexilant covered by her insurance.  I told her I will do a prior authorization for her and in the meantime I have left some samples of Dexilant at our front desk.  Pt was so happy to hear that she said she will be by tomorrow to pick them up

## 2012-07-03 ENCOUNTER — Encounter: Payer: Self-pay | Admitting: Family Medicine

## 2012-07-03 NOTE — Telephone Encounter (Signed)
Marj  Could you start on the prior authorization? Patient has upcoming prolia injection on 07/08/12. Thanks!

## 2012-07-04 ENCOUNTER — Telehealth: Payer: Self-pay | Admitting: Gastroenterology

## 2012-07-04 MED ORDER — DEXLANSOPRAZOLE 60 MG PO CPDR: 60.0000 mg | DELAYED_RELEASE_CAPSULE | Freq: Every day | ORAL | Status: DC

## 2012-07-04 NOTE — Telephone Encounter (Signed)
[employer] called to let us know pt's prior auth for Dexilant has been approved for 1 year 07/02/2012-07/02/2013; I called pt to let her know and she was very happy to hear this.

## 2012-07-08 ENCOUNTER — Ambulatory Visit: Payer: Medicare Other

## 2012-07-08 ENCOUNTER — Telehealth: Payer: Self-pay

## 2012-07-08 ENCOUNTER — Telehealth: Payer: Self-pay | Admitting: Hematology & Oncology

## 2012-07-08 NOTE — Telephone Encounter (Signed)
FYI: Pt states she is less tired since starting the thyroid medication, but is gaining weight quickly. Ankles are swelling. Back and neck pain is better.

## 2012-07-08 NOTE — Telephone Encounter (Addendum)
Message copied by Cathi Roan on Mon Jul 08, 2012  3:58 PM ------      Message from: Remy, [place] N      Created: Wed Apr 24, 2012 12:48 PM                   ----- Message -----         From: Josph Macho, MD         Sent: 04/23/2012   7:40 AM           To: Onc Nurse Hp            Call and tell her that her iron studies are almost normal now. This is fantastic. Thanks. Pete. ------07-08-12  Called and spoke to patient regarding above MD message, apologized for delay in calling with this information.  Lupita Raider LPN

## 2012-07-17 ENCOUNTER — Encounter: Payer: Self-pay | Admitting: Family Medicine

## 2012-07-17 NOTE — Telephone Encounter (Signed)
Please advise mychart question and no we haven't got the pa approval back yet

## 2012-07-17 NOTE — Telephone Encounter (Signed)
Will address

## 2012-07-23 ENCOUNTER — Ambulatory Visit: Payer: Medicare Other | Admitting: Hematology & Oncology

## 2012-07-23 ENCOUNTER — Other Ambulatory Visit: Payer: Medicare Other | Admitting: Lab

## 2012-07-23 ENCOUNTER — Encounter: Payer: Self-pay | Admitting: Family Medicine

## 2012-07-24 ENCOUNTER — Ambulatory Visit (HOSPITAL_BASED_OUTPATIENT_CLINIC_OR_DEPARTMENT_OTHER): Payer: Medicare Other | Admitting: Hematology & Oncology

## 2012-07-24 ENCOUNTER — Encounter: Payer: Self-pay | Admitting: Family Medicine

## 2012-07-24 ENCOUNTER — Other Ambulatory Visit (HOSPITAL_BASED_OUTPATIENT_CLINIC_OR_DEPARTMENT_OTHER): Payer: Medicare Other | Admitting: Lab

## 2012-07-24 VITALS — BP 126/65 | HR 97 | Temp 98.0°F | Resp 16 | Ht 62.0 in | Wt 128.0 lb

## 2012-07-24 DIAGNOSIS — D509 Iron deficiency anemia, unspecified: Secondary | ICD-10-CM

## 2012-07-24 DIAGNOSIS — G40909 Epilepsy, unspecified, not intractable, without status epilepticus: Secondary | ICD-10-CM

## 2012-07-24 DIAGNOSIS — G6181 Chronic inflammatory demyelinating polyneuritis: Secondary | ICD-10-CM

## 2012-07-24 DIAGNOSIS — D649 Anemia, unspecified: Secondary | ICD-10-CM

## 2012-07-24 LAB — IRON AND TIBC
%SAT: 32 % (ref 20–55)
Iron: 103 ug/dL (ref 42–145)
TIBC: 324 ug/dL (ref 250–470)
UIBC: 221 ug/dL (ref 125–400)

## 2012-07-24 LAB — CBC WITH DIFFERENTIAL (CANCER CENTER ONLY)
BASO#: 0 10*3/uL (ref 0.0–0.2)
BASO%: 0.6 % (ref 0.0–2.0)
EOS%: 6.8 % (ref 0.0–7.0)
Eosinophils Absolute: 0.5 10*3/uL (ref 0.0–0.5)
HCT: 41 % (ref 34.8–46.6)
HGB: 14 g/dL (ref 11.6–15.9)
LYMPH#: 2.8 10*3/uL (ref 0.9–3.3)
LYMPH%: 41.4 % (ref 14.0–48.0)
MCH: 30.6 pg (ref 26.0–34.0)
MCHC: 34.1 g/dL (ref 32.0–36.0)
MCV: 90 fL (ref 81–101)
MONO#: 0.5 10*3/uL (ref 0.1–0.9)
MONO%: 7 % (ref 0.0–13.0)
NEUT#: 3 10*3/uL (ref 1.5–6.5)
NEUT%: 44.2 % (ref 39.6–80.0)
Platelets: 270 10*3/uL (ref 145–400)
RBC: 4.57 10*6/uL (ref 3.70–5.32)
RDW: 13.7 % (ref 11.1–15.7)
WBC: 6.8 10*3/uL (ref 3.9–10.0)

## 2012-07-24 LAB — RETICULOCYTES (CHCC)
ABS Retic: 46.7 10*3/uL (ref 19.0–186.0)
RBC.: 4.67 MIL/uL (ref 3.87–5.11)
Retic Ct Pct: 1 % (ref 0.4–2.3)

## 2012-07-24 LAB — CHCC SATELLITE - SMEAR

## 2012-07-24 LAB — FERRITIN: Ferritin: 183 ng/mL (ref 10–291)

## 2012-07-24 NOTE — Telephone Encounter (Signed)
Prolia PA form received from [employer] and filled out and faxed back to [facility] this morning

## 2012-07-24 NOTE — Progress Notes (Signed)
This office note has been dictated.

## 2012-07-25 ENCOUNTER — Telehealth: Payer: Self-pay | Admitting: Hematology & Oncology

## 2012-07-25 ENCOUNTER — Encounter: Payer: Self-pay | Admitting: Family Medicine

## 2012-07-25 NOTE — Progress Notes (Signed)
CC:   Danise Edge, MD Charlcie Cradle. Delford Field, MD, FCCP  DIAGNOSES: 1. Iron deficiency anemia. 2. Chronic inflammatory demyelinating polyneuropathy. 3. Epilepsy.  CURRENT THERAPY:  IV iron as indicated.  INTERIM HISTORY:  Ms. Sturgess comes in for followup.  She feels very tired and fatigued.  I am not sure at all as to what is going on with her.  I do not know if this is something that is neurologic.  She thinks that she apparently was found have low thyroid activity.  She is not on Synthroid.  She says this is not making her feel any better.  We last checked her iron studies back in November.  Her ferritin was 452 with an iron saturation of 21%. She has had no bleeding.  There has been no change in bowel or bladder habits.  PHYSICAL EXAMINATION:  General:  This is a well-developed, well- nourished white female in no obvious distress.  Vital Signs: Temperature of 98, pulse 97, respiratory rate 16, blood pressure 126/65. Weight is 128.  Head and Neck:  Normocephalic, atraumatic skull.  There are no ocular or oral lesions.  There are no palpable cervical or supraclavicular lymph nodes.  Lungs:  Clear bilaterally.  Cardiac: Regular rate and rhythm with a normal S1 and S2.  There are no murmurs, rubs, or bruits.  Abdomen:  Soft with good bowel sounds.  There is no palpable abdominal mass.  There is no palpable hepatosplenomegaly. Extremities:  No clubbing, cyanosis, or edema.  Skin:  No rash, ecchymosis, or petechia.  Back:  No tenderness over the spine.  LABORATORY STUDIES:  White cell count 6.8, hemoglobin 14, hematocrit 41, platelet count 270.  MCV is 90.  IMPRESSION:  Ms. Abila is a very nice 79 year old white female with history of iron-deficiency anemia.  I just would be surprised if her iron was low.  Her MCV keeps coming up.  I think that her fatigue issues are much more than any blood issue.  Again, she is on numerous medications.  She has a lot of neurological issues.  We  probably do not need to get her back to see [place] for another couple months or so.  I do want to see what her iron studies are.  If we need to get her back in for some iron, we certainly can.    ______________________________ Josph Macho, M.D. PRE/MEDQ  D:  07/24/2012  T:  07/25/2012  Job:  [id number]

## 2012-07-25 NOTE — Telephone Encounter (Signed)
Pt aware of 4-30 appointment

## 2012-07-26 ENCOUNTER — Telehealth: Payer: Self-pay | Admitting: *Deleted

## 2012-07-26 NOTE — Telephone Encounter (Signed)
Message copied by Anselm Jungling on Fri Jul 26, 2012 12:19 PM ------      Message from: Arlan Organ R      Created: Wed Jul 24, 2012  9:16 PM       Call - iron is still ok!!  I hope you feel better!!! Cindee Lame ------

## 2012-07-26 NOTE — Telephone Encounter (Signed)
Called patient to let her know that her labwork and iron is all ok per dr. Myna Hidalgo

## 2012-07-29 ENCOUNTER — Ambulatory Visit: Payer: Medicare Other | Admitting: Internal Medicine

## 2012-07-29 ENCOUNTER — Telehealth: Payer: Self-pay

## 2012-07-29 NOTE — Telephone Encounter (Signed)
[name] w/ [facility] called stating that pts Prolia is covered through 07-26-12.

## 2012-08-01 NOTE — Telephone Encounter (Signed)
The coverage date is 07-26-13

## 2012-08-05 ENCOUNTER — Encounter: Payer: Self-pay | Admitting: Family Medicine

## 2012-08-05 ENCOUNTER — Ambulatory Visit (INDEPENDENT_AMBULATORY_CARE_PROVIDER_SITE_OTHER): Payer: Medicare Other | Admitting: Family Medicine

## 2012-08-05 VITALS — BP 158/92 | HR 107 | Temp 97.7°F | Ht 62.0 in | Wt 129.0 lb

## 2012-08-05 DIAGNOSIS — R5383 Other fatigue: Secondary | ICD-10-CM

## 2012-08-05 DIAGNOSIS — I10 Essential (primary) hypertension: Secondary | ICD-10-CM

## 2012-08-05 DIAGNOSIS — J329 Chronic sinusitis, unspecified: Secondary | ICD-10-CM

## 2012-08-05 DIAGNOSIS — E079 Disorder of thyroid, unspecified: Secondary | ICD-10-CM

## 2012-08-05 DIAGNOSIS — E039 Hypothyroidism, unspecified: Secondary | ICD-10-CM

## 2012-08-05 DIAGNOSIS — R5381 Other malaise: Secondary | ICD-10-CM

## 2012-08-05 DIAGNOSIS — M810 Age-related osteoporosis without current pathological fracture: Secondary | ICD-10-CM

## 2012-08-05 DIAGNOSIS — D649 Anemia, unspecified: Secondary | ICD-10-CM

## 2012-08-05 DIAGNOSIS — G6181 Chronic inflammatory demyelinating polyneuritis: Secondary | ICD-10-CM

## 2012-08-05 MED ORDER — DENOSUMAB 60 MG/ML ~~LOC~~ SOLN
60.0000 mg | Freq: Once | SUBCUTANEOUS | Status: AC
  Administered 2012-08-05: 60 mg via SUBCUTANEOUS

## 2012-08-05 NOTE — Patient Instructions (Addendum)

## 2012-08-06 ENCOUNTER — Encounter: Payer: Self-pay | Admitting: Family Medicine

## 2012-08-06 LAB — T4, FREE: Free T4: 1.14 ng/dL (ref 0.80–1.80)

## 2012-08-06 LAB — TSH: TSH: 3.213 u[IU]/mL (ref 0.350–4.500)

## 2012-08-07 NOTE — Telephone Encounter (Signed)
Please advise 

## 2012-08-08 ENCOUNTER — Encounter: Payer: Self-pay | Admitting: Family Medicine

## 2012-08-08 ENCOUNTER — Telehealth: Payer: Self-pay | Admitting: Family Medicine

## 2012-08-08 DIAGNOSIS — J329 Chronic sinusitis, unspecified: Secondary | ICD-10-CM

## 2012-08-08 DIAGNOSIS — E039 Hypothyroidism, unspecified: Secondary | ICD-10-CM | POA: Insufficient documentation

## 2012-08-08 HISTORY — DX: Chronic sinusitis, unspecified: J32.9

## 2012-08-08 MED ORDER — SIMVASTATIN 40 MG PO TABS: 40.0000 mg | ORAL_TABLET | Freq: Every day | ORAL | Status: DC

## 2012-08-08 NOTE — Telephone Encounter (Signed)
Refill- simvastatin 40mg  tab. Take one tablet daily at bedtime. Qty 30 last fill 2.11.14

## 2012-08-08 NOTE — Progress Notes (Signed)
Patient ID: [name], female   DOB: 1932/12/30, 79 y.o.   MRN: [id number] [name] [id number] 1932-12-26 08/08/2012      Progress Note-Follow Up  Subjective  Chief Complaint  Chief Complaint  Patient presents with  . Follow-up    HPI  Patient is a 79 year old Caucasian female who is in today complaining of worsening fatigue. She continues to struggle with feeling cold all the time and ongoing weakness. She has been struggling with sinusitis and has recently been taking amoxicillin but not limited and now Levaquin. His spelled somewhat. She does acknowledge snoring but not worsening. Did have a sleep study 2 years ago which was negative. She has head congestion productive of green phlegm as well as low grade headache and malaise. No chest pain or palpitations. No GI or GU complaints  Past Medical History  Diagnosis Date  . Anemia   . Anxiety     patient denied  . Depression     patient denied  . Asthma   . Chronic fatigue syndrome   . COPD (chronic obstructive pulmonary disease)   . GERD (gastroesophageal reflux disease)   . Hyperlipidemia   . Hypertension   . Colon polyp   . Allergic rhinitis   . Vitamin D deficiency   . Pulmonary embolism   . Pneumonia   . Neuropathy   . Arthritis   . 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 Blood transfusion   . Osteoporosis   . Thyroid disease   . Constipation   . Weakness   . Family history of anesthesia complication     Daughter with history of malignant hyperthermia  . Malignant hyperthermia     daughter   . Seizures     1990 last seizures on meds Phenobarb  . Skin cancer     squamous cell  . Neurogenic bladder 2013    husband caths pt  . Thyroid disease 08/08/2012  . Sinusitis 08/08/2012    Past Surgical History  Procedure Laterality Date  . Tonsillectomy and adenoidectomy    . Nasal septum surgery    . Dilation and curettage of uterus    . Tubal ligation    . Cesarean section    .  Sinus surgeries      x 4  . Left ovary and tube removed    . Vocal polyps removed    . Appendectomy    . Cystocele repair    . Carpal tunnel release      right  . Shoulder arthroscopy      x2 left, 1 right  . Left finger fusion    . Right median nerve decompression    . Duptyren's contracture right hand    . Finger ganglion cyst excision      right  . Abdominal hysterectomy    . Joint replacement      right and left basal joints of thumbs  . Bladder suspension    . Cataract extraction      bilateral  . Cervical neck ablation      x 7, C3-C4  . Squamous lesions removed      neck and face  . Basal cell carcinoma excision      face  . Panniculectomy      Family History  Problem Relation Age of Onset  . Heart disease Mother   . Hyperlipidemia Mother   . Allergies Sister   . Cancer Sister     ovarian  . Ovarian cancer  Sister   . Cancer Sister     lung - stage 1  . Thyroid cancer Sister   . Cancer Sister     thyroid - stage 1  . Lung cancer Sister   . Colon cancer Father   . Cancer Father     colon  . Stomach cancer      first cousin  . Hyperlipidemia Maternal Aunt   . Cancer Cousin     stomach    History   Social History  . Marital Status: Married    Spouse Name: N/A    Number of Children: 3  . Years of Education: N/A   Occupational History  . retired    Social History Main Topics  . Smoking status: Former Smoker -- 0.50 packs/day for 3 years    Types: Cigarettes    Quit date: 05/29/1978  . Smokeless tobacco: Never Used  . Alcohol Use: No  . Drug Use: No  . Sexually Active: Yes   Other Topics Concern  . Not on file   Social History Narrative  . No narrative on file    Current Outpatient Prescriptions on File Prior to Visit  Medication Sig Dispense Refill  . amLODipine (NORVASC) 10 MG tablet Take 1 tablet (10 mg total) by mouth every morning.  30 tablet  5  . amphetamine-dextroamphetamine (ADDERALL) 10 MG tablet Take 1 tablet (10 mg total)  by mouth 4 (four) times daily.  120 tablet  0  . arformoterol (BROVANA) 15 MCG/2ML NEBU Take 2 mLs (15 mcg total) by nebulization 2 (two) times daily.  120 mL  1  . budesonide (PULMICORT) 0.25 MG/2ML nebulizer solution Take 2 mLs (0.25 mg total) by nebulization 2 (two) times daily.  60 mL  3  . CALCIUM-VITAMIN D PO Take 1 tablet by mouth daily.       . cetirizine (ZYRTEC) 10 MG tablet Take 10 mg by mouth daily.        Marland Kitchen dexlansoprazole (DEXILANT) 60 MG capsule Take 1 capsule (60 mg total) by mouth daily.  30 capsule  12  . diclofenac sodium (VOLTAREN) 1 % GEL Apply 1 application topically 4 (four) times daily as needed. Applies to fingers and hands for gout.      Marland Kitchen HYDROcodone-homatropine (HYCODAN) 5-1.5 MG/5ML syrup Take 5 mLs by mouth as needed. For cough.      . levothyroxine (SYNTHROID, LEVOTHROID) 25 MCG tablet Take 1 tablet (25 mcg total) by mouth daily.  30 tablet  2  . lidocaine (LIDODERM) 5 % Place 2 patches onto the skin every morning. Remove & Discard patch within 12 hours or as directed by MD. Applies to back and neck.  60 patch  6  . mometasone (NASONEX) 50 MCG/ACT nasal spray Place 2 sprays into the nose daily.  17 g  2  . montelukast (SINGULAIR) 10 MG tablet Take 10 mg by mouth at bedtime.      . ondansetron (ZOFRAN-ODT) 4 MG disintegrating tablet Take 1 tablet (4 mg total) by mouth every 8 (eight) hours as needed for nausea.  30 tablet  0  . oxyCODONE-acetaminophen (PERCOCET) 10-325 MG per tablet Take 1 tablet by mouth every 4 (four) hours as needed for pain. For breakthrough pain. Stated she hasn't had to use since starting on Oxycontin 12h  60 tablet  0  . oxymorphone (OPANA ER) 20 MG 12 hr tablet Take 20 mg by mouth as needed.       Marland Kitchen PHENobarbital (LUMINAL) 32.4 MG  tablet Take 1-2 tablets (32.4-64.8 mg total) by mouth 4 (four) times daily -  with meals and at bedtime. She takes one tablet at 6 am, one tablet at noon and two tabletss at 8 pm.  120 tablet  5  . polyethylene glycol  (MIRALAX / GLYCOLAX) packet Take 17 g by mouth at bedtime.  14 each    . predniSONE (DELTASONE) 20 MG tablet Take 20 mg by mouth daily.      . simvastatin (ZOCOR) 40 MG tablet Take 1 tablet (40 mg total) by mouth at bedtime.  30 tablet  3  . theophylline (THEODUR) 200 MG 12 hr tablet Two by mouth twice a day  60 tablet  6  . Vitamin D, Ergocalciferol, (DRISDOL) 50000 UNITS CAPS TAKE 1 CAPSULE EVERY WEEK  5 capsule  5   No current facility-administered medications on file prior to visit.    Allergies  Allergen Reactions  . Iodine Other (See Comments)    Cardiac arrest  . Morphine And Related Other (See Comments)    Cardiac arrest    Review of Systems  Review of Systems  Constitutional: Positive for chills and malaise/fatigue. Negative for fever.  HENT: Positive for congestion.   Eyes: Negative for discharge.  Respiratory: Negative for shortness of breath.   Cardiovascular: Negative for chest pain, palpitations and leg swelling.  Gastrointestinal: Negative for nausea, abdominal pain and diarrhea.  Genitourinary: Negative for dysuria.  Musculoskeletal: Negative for falls.  Skin: Negative for rash.  Neurological: Positive for headaches. Negative for loss of consciousness.  Endo/Heme/Allergies: Negative for polydipsia.  Psychiatric/Behavioral: Negative for depression and suicidal ideas. The patient is not nervous/anxious and does not have insomnia.     Objective  BP 158/92  Pulse 107  Temp(Src) 97.7 F (36.5 C) (Oral)  Ht 5\' 2"  (1.575 m)  Wt 129 lb 0.6 oz (58.532 kg)  BMI 23.6 kg/m2  SpO2 96%  Physical Exam  Physical Exam  Constitutional: She is well-developed, well-nourished, and in no distress. No distress.  HENT:  Left Ear: External ear normal.  Mouth/Throat: No oropharyngeal exudate.  TMs dull and retracted. Nasal mucosa boggy and erythematous  Eyes: EOM are normal. Left eye exhibits no discharge. No scleral icterus.  Neck: No JVD present. Tracheal deviation  present.  Cardiovascular: Normal heart sounds and intact distal pulses.   Pulmonary/Chest: No respiratory distress. She has no rales.  Abdominal: She exhibits no distension and no mass. There is tenderness. There is no guarding.  Musculoskeletal: She exhibits no edema and no tenderness.  Lymphadenopathy:    She has no cervical adenopathy.  Skin: No rash noted. No erythema.    Lab Results  Component Value Date   TSH 3.213 08/05/2012   Lab Results  Component Value Date   WBC 6.8 07/24/2012   HGB 14.0 07/24/2012   HCT 41.0 07/24/2012   MCV 90 07/24/2012   PLT 270 07/24/2012   Lab Results  Component Value Date   CREATININE 0.78 06/27/2012   BUN 20 06/27/2012   NA 143 06/27/2012   K 5.0 06/27/2012   CL 106 06/27/2012   CO2 27 06/27/2012   Lab Results  Component Value Date   ALT 9 01/05/2012   AST 21 01/05/2012   ALKPHOS 87 01/05/2012   BILITOT 0.2* 01/05/2012   Lab Results  Component Value Date   CHOL 157 01/05/2012   Lab Results  Component Value Date   HDL 45 01/05/2012   Lab Results  Component Value Date  LDLCALC 95 01/05/2012   Lab Results  Component Value Date   TRIG 87 01/05/2012   Lab Results  Component Value Date   CHOLHDL 3.5 01/05/2012     Assessment & Plan  CIDP (chronic inflammatory demyelinating polyneuropathy) Worsening unsteady gait. Is following with neurology  Anemia Well controlled, resolved at this time  Hypertension Mildly elevated, minimize sodium, caffeine  Thyroid disease Recent adjustment in Levothyroxine dosing has been helping the TSH values no changes at this time  Sinusitis Started on antibiotics, mucinex, probiotics, increase rest and hydration

## 2012-08-08 NOTE — Telephone Encounter (Signed)
Rx to pharmacy/SLS 

## 2012-08-08 NOTE — Assessment & Plan Note (Signed)
Well controlled, resolved at this time

## 2012-08-08 NOTE — Assessment & Plan Note (Addendum)
Started on antibiotics, mucinex, probiotics, increase rest and hydration 

## 2012-08-08 NOTE — Assessment & Plan Note (Signed)
Mildly elevated, minimize sodium, caffeine

## 2012-08-08 NOTE — Assessment & Plan Note (Signed)
Recent adjustment in Levothyroxine dosing has been helping the TSH values no changes at this time

## 2012-08-08 NOTE — Assessment & Plan Note (Signed)
Worsening unsteady gait. Is following with neurology

## 2012-08-12 ENCOUNTER — Encounter: Payer: Self-pay | Admitting: Internal Medicine

## 2012-08-12 ENCOUNTER — Ambulatory Visit (INDEPENDENT_AMBULATORY_CARE_PROVIDER_SITE_OTHER): Payer: Medicare Other | Admitting: Internal Medicine

## 2012-08-12 VITALS — BP 128/82 | HR 111 | Temp 97.4°F | Resp 10 | Ht 62.0 in | Wt 120.0 lb

## 2012-08-12 DIAGNOSIS — E079 Disorder of thyroid, unspecified: Secondary | ICD-10-CM

## 2012-08-12 DIAGNOSIS — R5383 Other fatigue: Secondary | ICD-10-CM

## 2012-08-12 DIAGNOSIS — R5381 Other malaise: Secondary | ICD-10-CM

## 2012-08-12 NOTE — Progress Notes (Signed)
Subjective:     Patient ID: [name], female   DOB: 11-27-1932, 79 y.o.   MRN: [id number]  HPI [name] is a pleasant 79 year old woman, referred by PCP, Dr. Abner Greenspan, for questionable hypothyroidism, or other possible endocrine reasons for fatigue. She is here with her husband, who offers parts of the history.  The patient has a very complicated past medical history, mostly prominent for CIDP, diagnosed 2 years ago, and because of which, she developed lack of sensation in hands and feet, gastroparesis, lack of control of bladder and rectal sphincters, requiring catheterization and to wear diapers, respectively. The patient describes that everything started in 2007 with a severe neck pain, then she developed the same very intense pain in her low back. He was then referred to a neurosurgeon, however, after multiple imaging tests, no surgery was recommended. She was then sent to rheumatology (Dr. Corliss Skains), who recommended pain management. She was then sent to see Dr. Ethelene Hal for pain management, and she continues to see him today. She was then sent to neurology (Dr. Mardi Mainland, with [facility]). He was the one that dx her with CIDP 2 years ago. She had 2 treatments with IVIG last year, and she felt that her gait was improving however, objectively, there was not much change. This treatment caused anemia, with the lowest hemoglobin of 9.0. She was sent to hematology, and now she is seeing Dr. Myna Hidalgo, too. Her last hemoglobin was normal, though. She has been on prednisone high dose for a long time, with doses up to 90-100 mg daily. She is now down to 20 mg daily. She had cortisol levels drawn at least twice in the past and they were either high or low , however I doubt the significance of these tests if they were done while she was on prednisone. She will go to [facility] next, in June of this year, to see if there are any treatments that she can try. Rituximab was suggested by Dr.  Myna Hidalgo, the patient did not have this yet.   Of note, patient also has severe COPD, and she is on inhaled steroids, and also to a feeling her last level was normal at 16.5, on 10/31 2013. She has been supratherapeutic in the past. This appears to control her pulmonary symptoms. She also has nebulizer treatments at home. Patient was also diagnosed with osteoporosis, and she is on Prolia now, first injection last week, and she was on Forteo for 2 years before that. She does have disequilibrium due to her neuropathy, and is at high risk for falls. She has vitamin D deficiency, I do not see a vitamin D level in our computer system, but patient and her husband remembered that she had one drawn fairly recently and that was >30. She has severe osteoarthritis of the hands, and has had fusion surgeries for some of her fingers, and she also had joint replacement on both of her halluces. She also has a history of seizure disorders, however the last seizure was in the 1990s, but she has remained on phenobarbital, on which she has been for many years. Last phenobarbital level was normal, 2 months ago. She also describes a history of colon polyps, and she had surgery to remove them. She has nausea and vomiting from her gastroparesis, however he is able to absorb most of her food, and she actually gained weight more recently. Her last albumin level was normal.  The patient describes severe fatigue that started insidiously. This is incapacitating  for her, and she barely has energy to get out of bed. This is impairing her social life and is very bothersome for the patient. She has been started on Adderall, which initially helped with the fatigue, but now she does not feel a difference anymore. She did stop it for a while and felt much better after she restarted it. Also to investigate her fatigue, she had thyroid tests done, and because her free T4 was slightly low (at 0.69) - [TSH normal (3.257)], she was started on Synthroid  25 mcg daily. On repeat, her TSH remained the same (3.213), however the free T4 normalized (1.14). She does remember that she had an episode of hyperthyroidism in the distant past (when she was in her 34s), which resolved by itself. The patient is curious whether her degree of thyroid dysfunction can cause her fatigue, and whether she needs to take the Synthroid or not.  Review of Systems Constitutional: + weight gain, +++ fatigue, + subjective hypothermia, poor sleep Eyes: no blurry vision, no xerophthalmia ENT: no sore throat, no nodules palpated in throat, no dysphagia/odynophagia, no hoarseness Cardiovascular: no CP/SOB/palpitations/+ hand and feet swelling - occasionally Respiratory: no cough/SOB, occasional wheezing Gastrointestinal: + N/no V/no D/+C Musculoskeletal:no muscle/+joint aches and swelling Skin: no rashes Neurological: no tremors/numbness/tingling/dizziness Psychiatric: no depression/anxiety  Past Medical History  Diagnosis Date  . Anemia   . Anxiety     patient denied  . Depression     patient denied  . Asthma   . Chronic fatigue syndrome   . COPD (chronic obstructive pulmonary disease)   . GERD (gastroesophageal reflux disease)   . Hyperlipidemia   . Hypertension   . Colon polyp   . Allergic rhinitis   . Vitamin D deficiency   . Pulmonary embolism   . Pneumonia   . Neuropathy   . Arthritis   . 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 Blood transfusion   . Osteoporosis   . Thyroid disease   . Constipation   . Weakness   . Family history of anesthesia complication     Daughter with history of malignant hyperthermia  . Malignant hyperthermia     daughter   . Seizures     1990 last seizures on meds Phenobarb  . Skin cancer     squamous cell  . Neurogenic bladder 2013    husband caths pt  . Thyroid disease 08/08/2012  . Sinusitis 08/08/2012   Past Surgical History  Procedure Laterality Date  . Tonsillectomy and adenoidectomy     . Nasal septum surgery    . Dilation and curettage of uterus    . Tubal ligation    . Cesarean section    . Sinus surgeries      x 4  . Left ovary and tube removed    . Vocal polyps removed    . Appendectomy    . Cystocele repair    . Carpal tunnel release      right  . Shoulder arthroscopy      x2 left, 1 right  . Left finger fusion    . Right median nerve decompression    . Duptyren's contracture right hand    . Finger ganglion cyst excision      right  . Abdominal hysterectomy    . Joint replacement      right and left basal joints of thumbs  . Bladder suspension    . Cataract extraction      bilateral  . Cervical  neck ablation      x 7, C3-C4  . Squamous lesions removed      neck and face  . Basal cell carcinoma excision      face  . Panniculectomy     History   Social History  . Marital Status: Married    Spouse Name: N/A    Number of Children: 3  . Years of Education: N/A   Occupational History  . Retired Nature conservation officer    Social History Main Topics  . Smoking status: Former Smoker -- 0.50 packs/day for 3 years    Types: Cigarettes    Quit date: 05/29/1981  . Smokeless tobacco: Never Used  . Alcohol Use: No  . Drug Use: No  . Sexually Active: Yes   Medication Sig  . amLODipine (NORVASC) 10 MG tablet Take 1 tablet (10 mg total) by mouth every morning.  Marland Kitchen amphetamine-dextroamphetamine (ADDERALL) 10 MG tablet Take 1 tablet (10 mg total) by mouth 4 (four) times daily.  Marland Kitchen arformoterol (BROVANA) 15 MCG/2ML NEBU Take 2 mLs (15 mcg total) by nebulization 2 (two) times daily.  . budesonide (PULMICORT) 0.25 MG/2ML nebulizer solution Take 2 mLs (0.25 mg total) by nebulization 2 (two) times daily.  Marland Kitchen CALCIUM-VITAMIN D PO Take 1 tablet by mouth daily.   . cetirizine (ZYRTEC) 10 MG tablet Take 10 mg by mouth daily.    Marland Kitchen dexlansoprazole (DEXILANT) 60 MG capsule Take 1 capsule (60 mg total) by mouth daily.  . diclofenac sodium (VOLTAREN) 1 % GEL Apply 1  application topically 4 (four) times daily as needed. Applies to fingers and hands for gout.  Marland Kitchen HYDROcodone-homatropine (HYCODAN) 5-1.5 MG/5ML syrup Take 5 mLs by mouth as needed. For cough.  Marland Kitchen levofloxacin (LEVAQUIN) 500 MG tablet Take 500 mg by mouth daily.  Marland Kitchen levothyroxine (SYNTHROID, LEVOTHROID) 25 MCG tablet Take 1 tablet (25 mcg total) by mouth daily.  Marland Kitchen lidocaine (LIDODERM) 5 % Place 2 patches onto the skin every morning. Remove & Discard patch within 12 hours or as directed by MD. Applies to back and neck.  . mometasone (NASONEX) 50 MCG/ACT nasal spray Place 2 sprays into the nose daily.  . montelukast (SINGULAIR) 10 MG tablet Take 10 mg by mouth at bedtime.  . ondansetron (ZOFRAN-ODT) 4 MG disintegrating tablet Take 1 tablet (4 mg total) by mouth every 8 (eight) hours as needed for nausea.  Marland Kitchen oxyCODONE-acetaminophen (PERCOCET) 10-325 MG per tablet Take 1 tablet by mouth every 4 (four) hours as needed for pain. For breakthrough pain. Stated she hasn't had to use since starting on Oxycontin 12h  . oxymorphone (OPANA ER) 20 MG 12 hr tablet Take 20 mg by mouth as needed.   Marland Kitchen PHENobarbital (LUMINAL) 32.4 MG tablet Take 1-2 tablets (32.4-64.8 mg total) by mouth 4 (four) times daily -  with meals and at bedtime. She takes one tablet at 6 am, one tablet at noon and two tabletss at 8 pm.  . polyethylene glycol (MIRALAX / GLYCOLAX) packet Take 17 g by mouth at bedtime.  . predniSONE (DELTASONE) 20 MG tablet Take 20 mg by mouth daily.  . simvastatin (ZOCOR) 40 MG tablet Take 1 tablet (40 mg total) by mouth at bedtime.  . theophylline (THEODUR) 200 MG 12 hr tablet Two by mouth twice a day  . Vitamin D, Ergocalciferol, (DRISDOL) 50000 UNITS CAPS TAKE 1 CAPSULE EVERY WEEK   No current facility-administered medications on file prior to visit.   Allergies  Allergen Reactions  . Iodine Other (  See Comments)    Cardiac arrest  . Morphine And Related Other (See Comments)    Cardiac arrest   Family  History  Problem Relation Age of Onset  . Heart disease Mother   . Hyperlipidemia Mother   . Allergies Sister   . Cancer Sister     ovarian  . Ovarian cancer Sister   . Cancer Sister     lung - stage 1  . Thyroid cancer Sister   . Cancer Sister     thyroid - stage 1  . Lung cancer Sister   . Colon cancer Father   . Cancer Father     colon  . Stomach cancer      first cousin  . Hyperlipidemia Maternal Aunt   . Cancer Cousin     stomach   Objective:   Physical Exam BP 128/82  Pulse 111  Temp(Src) 97.4 F (36.3 C) (Oral)  Resp 10  Ht 5\' 2"  (1.575 m)  Wt 120 lb (54.432 kg)  BMI 21.94 kg/m2  SpO2 97% Wt Readings from Last 3 Encounters:  08/12/12 120 lb (54.432 kg)  08/05/12 129 lb 0.6 oz (58.532 kg)  07/24/12 128 lb (58.06 kg)   Constitutional: normal weight, in NAD, very anxious appearing Eyes: PERRLA, EOMI, no exophthalmos ENT: drymucous membranes, no thyromegaly, no cervical lymphadenopathy Cardiovascular: Tachycardia, RR, No MRG Respiratory: CTA B Gastrointestinal: abdomen soft, NT, ND, BS+ Musculoskeletal: finger deformities - Dupuytren contracture in several fingers, immobile joints do to fusion, strength intact in all 4 Skin: moist, warm, no rashes Neurological: mild tremor with outstretched hands, DTR 0/4  Assessment:     1. Fatigue - multifactorial - CIDP - on Prednisone high dose - on Ergocalciferol weekly - normal Hb  2. Very mild hypothyroidism  - on Synthroid 25    Plan:     1. Fatigue - Patient with very complicated past medical history, with multiple reasons for fatigue, with multiple investigations in the past. We discussed about possible endocrine reasons for fatigue, which include hypothyroidism, adrenal insufficiency, pituitary abnormalities. - I reviewed her previous cortisol levels, however they were checked when patient was taking various forms of corticosteroids, so I do not feel that they are helpful. There is no point in checking  another level, since she is on the treatment for adrenal insufficiency, prednisone. - Regarding her hypothyroidism, she had a TSH level that was >3, with a low free T4, and a low dose of Synthroid normalized her free T4. II'm not opposed to continue the medication if the patient desires to do so. I advised her to try to stop the Synthroid for a few weeks to see how she feels, and if she feels a difference, then to restart it. I explained that I do not believe that her extreme fatigue is caused by her thyroid, since her tests are normal. - A pituitary insufficiency can cause fatigue, so I ordered an IGF-I, prolactin, LH, FSH, estradiol, to investigate her pituitary function, however, I feel that this is low yield for a diagnosis. I explained this to the patient and her husband, and the fact that I feel that even ruling out pituitary conditions might be beneficial for her.  - Anemia, malnutrition, vitamin D deficiency, and polypharmacy can contribute. The patient's last hemoglobin was normal, she does not appear malnourished based on her last albumin and her weight, reportedly her vitamin D level was normal at last check, however she is on multiple medications (of which phenobarbital and theophylline). Her  latest phenobarbital level was normal, recently, however, at this visit I would like to check a theophylline level.  - I will let the patient know her results through my chart - I will not schedule a return appointment, but I will see her on a prn basis  Office Visit on 08/12/2012  Component Date Value Range Status  . Somatomedin (IGF-I) 08/12/2012 118  31 - 179 ng/mL Final  . Prolactin 08/12/2012 7.8   Final   Comment:      Reference Ranges:                                           Female:                       2.1 -  17.1 ng/ml                                           Female:   Pregnant          9.7 - 208.5 ng/mL                                                     Non Pregnant      2.8 -  29.2 ng/mL                                                      Post Menopausal   1.8 -  20.3 ng/mL                                              . [facility] 08/12/2012 21.10   Final   Comment: Female Reference Range:20-70 yrs     1.5-9.3 mIU/mL>70 yrs       3.1-35.6 mIU/mLFemale Reference Range:Follicular Phase     1.9-12.5 mIU/mLMidcycle             8.7-76.3 mIU/mLLuteal Phase         0.5-16.9 mIU/mL  Post Menopausal      15.9-54.0                           mIU/mLPregnant             <1.5 mIU/mLContraceptives       0.7-5.6 mIU/mL  . [facility] 08/12/2012 57.1   Final   Female Reference Range:  1.4-18.1 mIU/mLFemale Reference Range:Follicular Phase          2.5-10.2 mIU/mLMidCycle Peak          3.4-33.4 mIU/mLLuteal Phase          1.5-9.1 mIU/mLPost Menopausal     23.0-116.3 mIU/mLPregnant          <0.3 mIU/mL  . Estradiol 08/12/2012 19.2   Final   Comment:  Males                           0.0 -  39.0 pg/mL                                                        Menstruating Females (by day in cycle relative to LH peak)                                                        Follicular phase (-12 to -4)   19.5 - 144.2 pg/mL                                                        Midcycle          (-3 to +2)   63.9 - 356.7 pg/mL                                                          Postmenopausal Females          0.0 -  32.2 pg/mL                               (untreated)                             . Theophylline Lvl 08/12/2012 18.8  10.0 - 20.0 ug/mL Final   No pituitary dysfunction. Theophylline level also normal.

## 2012-08-12 NOTE — Patient Instructions (Addendum)
I will send you the results of your labs through MyChart.

## 2012-08-13 ENCOUNTER — Encounter: Payer: Self-pay | Admitting: Internal Medicine

## 2012-08-13 LAB — FOLLICLE STIMULATING HORMONE: FSH: 57.1 m[IU]/mL

## 2012-08-13 LAB — INSULIN-LIKE GROWTH FACTOR: Somatomedin (IGF-I): 118 ng/mL (ref 31–179)

## 2012-08-13 LAB — PROLACTIN: Prolactin: 7.8 ng/mL

## 2012-08-13 LAB — THEOPHYLLINE LEVEL: Theophylline Lvl: 18.8 ug/mL (ref 10.0–20.0)

## 2012-08-13 LAB — ESTRADIOL: Estradiol: 19.2 pg/mL

## 2012-08-13 LAB — LUTEINIZING HORMONE: LH: 21.1 m[IU]/mL

## 2012-08-14 ENCOUNTER — Encounter (INDEPENDENT_AMBULATORY_CARE_PROVIDER_SITE_OTHER): Payer: Medicare Other | Admitting: General Surgery

## 2012-08-15 ENCOUNTER — Telehealth: Payer: Self-pay

## 2012-08-15 ENCOUNTER — Telehealth: Payer: Self-pay | Admitting: Gastroenterology

## 2012-08-15 NOTE — Telephone Encounter (Signed)
Faxed Prior auth form to [facility] [employer] [id number] for Zofran 4mg 

## 2012-08-15 NOTE — Telephone Encounter (Signed)
We received documentation from [facility] stating that pts Amphetamine will be covered through 08-05-13

## 2012-08-16 ENCOUNTER — Telehealth: Payer: Self-pay | Admitting: Gastroenterology

## 2012-08-16 NOTE — Telephone Encounter (Signed)
[name] called to let me know Zofran ODT has been approved for 1 year. They are faxing over an approval letter and letting the patient know.

## 2012-08-20 ENCOUNTER — Emergency Department (HOSPITAL_BASED_OUTPATIENT_CLINIC_OR_DEPARTMENT_OTHER): Payer: Medicare Other

## 2012-08-20 ENCOUNTER — Telehealth: Payer: Self-pay | Admitting: Internal Medicine

## 2012-08-20 ENCOUNTER — Encounter (HOSPITAL_BASED_OUTPATIENT_CLINIC_OR_DEPARTMENT_OTHER): Payer: Self-pay | Admitting: *Deleted

## 2012-08-20 ENCOUNTER — Encounter: Payer: Self-pay | Admitting: Physician Assistant

## 2012-08-20 ENCOUNTER — Emergency Department (HOSPITAL_BASED_OUTPATIENT_CLINIC_OR_DEPARTMENT_OTHER)
Admission: EM | Admit: 2012-08-20 | Discharge: 2012-08-20 | Disposition: A | Discharge status: Home / Self Care | Payer: Medicare Other | Attending: Emergency Medicine | Admitting: Emergency Medicine

## 2012-08-20 ENCOUNTER — Ambulatory Visit (INDEPENDENT_AMBULATORY_CARE_PROVIDER_SITE_OTHER): Payer: Medicare Other | Admitting: Physician Assistant

## 2012-08-20 ENCOUNTER — Other Ambulatory Visit: Payer: Self-pay

## 2012-08-20 VITALS — BP 130/80 | HR 72 | Temp 98.3°F | Ht 62.0 in | Wt 124.6 lb

## 2012-08-20 DIAGNOSIS — Z8669 Personal history of other diseases of the nervous system and sense organs: Secondary | ICD-10-CM | POA: Insufficient documentation

## 2012-08-20 DIAGNOSIS — Z8739 Personal history of other diseases of the musculoskeletal system and connective tissue: Secondary | ICD-10-CM | POA: Insufficient documentation

## 2012-08-20 DIAGNOSIS — R531 Weakness: Secondary | ICD-10-CM

## 2012-08-20 DIAGNOSIS — Z862 Personal history of diseases of the blood and blood-forming organs and certain disorders involving the immune mechanism: Secondary | ICD-10-CM | POA: Insufficient documentation

## 2012-08-20 DIAGNOSIS — K219 Gastro-esophageal reflux disease without esophagitis: Secondary | ICD-10-CM | POA: Insufficient documentation

## 2012-08-20 DIAGNOSIS — Z860100 Personal history of colon polyps, unspecified: Secondary | ICD-10-CM | POA: Insufficient documentation

## 2012-08-20 DIAGNOSIS — E785 Hyperlipidemia, unspecified: Secondary | ICD-10-CM | POA: Insufficient documentation

## 2012-08-20 DIAGNOSIS — E860 Dehydration: Secondary | ICD-10-CM | POA: Insufficient documentation

## 2012-08-20 DIAGNOSIS — R112 Nausea with vomiting, unspecified: Secondary | ICD-10-CM

## 2012-08-20 DIAGNOSIS — Z86711 Personal history of pulmonary embolism: Secondary | ICD-10-CM | POA: Insufficient documentation

## 2012-08-20 DIAGNOSIS — IMO0002 Reserved for concepts with insufficient information to code with codable children: Secondary | ICD-10-CM | POA: Insufficient documentation

## 2012-08-20 DIAGNOSIS — Z87891 Personal history of nicotine dependence: Secondary | ICD-10-CM | POA: Insufficient documentation

## 2012-08-20 DIAGNOSIS — I10 Essential (primary) hypertension: Secondary | ICD-10-CM | POA: Insufficient documentation

## 2012-08-20 DIAGNOSIS — Z85828 Personal history of other malignant neoplasm of skin: Secondary | ICD-10-CM | POA: Insufficient documentation

## 2012-08-20 DIAGNOSIS — E039 Hypothyroidism, unspecified: Secondary | ICD-10-CM | POA: Insufficient documentation

## 2012-08-20 DIAGNOSIS — E559 Vitamin D deficiency, unspecified: Secondary | ICD-10-CM | POA: Insufficient documentation

## 2012-08-20 DIAGNOSIS — G40909 Epilepsy, unspecified, not intractable, without status epilepticus: Secondary | ICD-10-CM | POA: Insufficient documentation

## 2012-08-20 DIAGNOSIS — J449 Chronic obstructive pulmonary disease, unspecified: Secondary | ICD-10-CM | POA: Insufficient documentation

## 2012-08-20 DIAGNOSIS — K3184 Gastroparesis: Secondary | ICD-10-CM

## 2012-08-20 DIAGNOSIS — J4489 Other specified chronic obstructive pulmonary disease: Secondary | ICD-10-CM | POA: Insufficient documentation

## 2012-08-20 DIAGNOSIS — R42 Dizziness and giddiness: Secondary | ICD-10-CM | POA: Insufficient documentation

## 2012-08-20 DIAGNOSIS — Z79899 Other long term (current) drug therapy: Secondary | ICD-10-CM | POA: Insufficient documentation

## 2012-08-20 DIAGNOSIS — Z8701 Personal history of pneumonia (recurrent): Secondary | ICD-10-CM | POA: Insufficient documentation

## 2012-08-20 DIAGNOSIS — Z8659 Personal history of other mental and behavioral disorders: Secondary | ICD-10-CM | POA: Insufficient documentation

## 2012-08-20 DIAGNOSIS — R5381 Other malaise: Secondary | ICD-10-CM

## 2012-08-20 LAB — TSH: TSH: 0.872 u[IU]/mL (ref 0.350–4.500)

## 2012-08-20 LAB — URINALYSIS, ROUTINE W REFLEX MICROSCOPIC
Bilirubin Urine: NEGATIVE
Glucose, UA: NEGATIVE mg/dL
Hgb urine dipstick: NEGATIVE
Ketones, ur: 40 mg/dL — AB
Leukocytes, UA: NEGATIVE
Nitrite: NEGATIVE
Protein, ur: NEGATIVE mg/dL
Specific Gravity, Urine: 1.024 (ref 1.005–1.030)
Urobilinogen, UA: 0.2 mg/dL (ref 0.0–1.0)
pH: 7 (ref 5.0–8.0)

## 2012-08-20 LAB — CBC WITH DIFFERENTIAL/PLATELET
Basophils Absolute: 0 10*3/uL (ref 0.0–0.1)
Basophils Relative: 1 % (ref 0–1)
Eosinophils Absolute: 0.4 10*3/uL (ref 0.0–0.7)
Eosinophils Relative: 9 % — ABNORMAL HIGH (ref 0–5)
HCT: 40.8 % (ref 36.0–46.0)
Hemoglobin: 14.2 g/dL (ref 12.0–15.0)
Lymphocytes Relative: 36 % (ref 12–46)
Lymphs Abs: 1.6 10*3/uL (ref 0.7–4.0)
MCH: 30.9 pg (ref 26.0–34.0)
MCHC: 34.8 g/dL (ref 30.0–36.0)
MCV: 88.7 fL (ref 78.0–100.0)
Monocytes Absolute: 0.5 10*3/uL (ref 0.1–1.0)
Monocytes Relative: 10 % (ref 3–12)
Neutro Abs: 1.9 10*3/uL (ref 1.7–7.7)
Neutrophils Relative %: 44 % (ref 43–77)
Platelets: 241 10*3/uL (ref 150–400)
RBC: 4.6 MIL/uL (ref 3.87–5.11)
RDW: 13.6 % (ref 11.5–15.5)
WBC: 4.3 10*3/uL (ref 4.0–10.5)

## 2012-08-20 LAB — COMPREHENSIVE METABOLIC PANEL
ALT: 14 U/L (ref 0–35)
AST: 24 U/L (ref 0–37)
Albumin: 4.1 g/dL (ref 3.5–5.2)
Alkaline Phosphatase: 122 U/L — ABNORMAL HIGH (ref 39–117)
BUN: 18 mg/dL (ref 6–23)
CO2: 24 mEq/L (ref 19–32)
Calcium: 8.7 mg/dL (ref 8.4–10.5)
Chloride: 106 mEq/L (ref 96–112)
Creatinine, Ser: 0.6 mg/dL (ref 0.50–1.10)
GFR calc Af Amer: >90 mL/min (ref >90)
GFR calc non Af Amer: 86 mL/min — ABNORMAL LOW (ref >90)
Glucose, Bld: 84 mg/dL (ref 70–99)
Potassium: 4.3 mEq/L (ref 3.5–5.1)
Sodium: 139 mEq/L (ref 135–145)
Total Bilirubin: 0.2 mg/dL — ABNORMAL LOW (ref 0.3–1.2)
Total Protein: 7.5 g/dL (ref 6.0–8.3)

## 2012-08-20 NOTE — ED Notes (Signed)
Pt states that her nausea has subsided at this time, continues to deny any CP or SOB. Pt resting in bed talking with family, NAD noted.

## 2012-08-20 NOTE — ED Notes (Signed)
Pt has had periods of dizziness for past few days with nausea and some ringing in her ears. Pt states that she woke from sleep this AM in a puddle of sweat. Pt denies any CP or SOB.

## 2012-08-20 NOTE — Patient Instructions (Addendum)
Sip on fluids all through the day. Full liquids, soft diet, small frequent feedings. Take Zofran when you get up in the morning and every 6 hours for nausea. Start the Reglan 3 times daily, 1/2 hour before each meal.  Your appointment with Dr. Rhea Belton is 08-30-2012 at 1:30 PM .  Your Zofran prescription has been approved with the insurance company.

## 2012-08-20 NOTE — ED Provider Notes (Signed)
History     CSN: [id number]  Arrival date & time 08/20/12  4098   None     Chief Complaint  Patient presents with  . Nausea  . Dizziness    (Consider location/radiation/quality/duration/timing/severity/associated sxs/prior treatment) HPI Comments: Patient is a 79 year old woman who has had a chronic neurologic disease, chronic inflammatory demyelinating polyneuropathy. As a result, she has a neurogenic bladder and fecal incontinence. She has seizure disorder, hypothyroidism, COPD, and gastroesophageal reflux disease. Yesterday she didn't feel very good. She was nauseated and had become disoriented. She went home and rested. During the night last night she felt hot, but also had a cold sweat. She had some palpitations. Therefore she sought evaluation.  Patient is a 79 y.o. female presenting with weakness. The history is provided by the patient and the spouse. No language interpreter was used.  Weakness The current episode started yesterday. The problem occurs constantly. The problem has not changed since onset.Pertinent negatives include no chest pain, no abdominal pain, no headaches and no shortness of breath. Associated symptoms comments: Nausea, sense of disorientation, which she means that she felt like she was in another place, sweating, and palpitations.. Nothing aggravates the symptoms. Nothing relieves the symptoms. Treatments tried: She took Zofran for nausea.    Past Medical History  Diagnosis Date  . Anemia   . Anxiety     patient denied  . Depression     patient denied  . Asthma   . Chronic fatigue syndrome   . COPD (chronic obstructive pulmonary disease)   . GERD (gastroesophageal reflux disease)   . Hyperlipidemia   . Hypertension   . Colon polyp   . Allergic rhinitis   . Vitamin D deficiency   . Pulmonary embolism   . Pneumonia   . Neuropathy   . Arthritis   . 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 Blood transfusion   . Osteoporosis    . Thyroid disease   . Constipation   . Weakness   . Family history of anesthesia complication     Daughter with history of malignant hyperthermia  . Malignant hyperthermia     daughter   . Seizures     1990 last seizures on meds Phenobarb  . Skin cancer     squamous cell  . Neurogenic bladder 2013    husband caths pt  . Thyroid disease 08/08/2012  . Sinusitis 08/08/2012    Past Surgical History  Procedure Laterality Date  . Tonsillectomy and adenoidectomy    . Nasal septum surgery    . Dilation and curettage of uterus    . Tubal ligation    . Cesarean section    . Sinus surgeries      x 4  . Left ovary and tube removed    . Vocal polyps removed    . Appendectomy    . Cystocele repair    . Carpal tunnel release      right  . Shoulder arthroscopy      x2 left, 1 right  . Left finger fusion    . Right median nerve decompression    . Duptyren's contracture right hand    . Finger ganglion cyst excision      right  . Abdominal hysterectomy    . Joint replacement      right and left basal joints of thumbs  . Bladder suspension    . Cataract extraction      bilateral  . Cervical neck ablation  x 7, C3-C4  . Squamous lesions removed      neck and face  . Basal cell carcinoma excision      face  . Panniculectomy      Family History  Problem Relation Age of Onset  . Heart disease Mother   . Hyperlipidemia Mother   . Allergies Sister   . Cancer Sister     ovarian  . Ovarian cancer Sister   . Cancer Sister     lung - stage 1  . Thyroid cancer Sister   . Cancer Sister     thyroid - stage 1  . Lung cancer Sister   . Colon cancer Father   . Cancer Father     colon  . Stomach cancer      first cousin  . Hyperlipidemia Maternal Aunt   . Cancer Cousin     stomach    History  Substance Use Topics  . Smoking status: Former Smoker -- 0.50 packs/day for 3 years    Types: Cigarettes    Quit date: 05/29/1978  . Smokeless tobacco: Never Used  . Alcohol  Use: No    OB History   Grav Para Term Preterm Abortions TAB SAB Ect Mult Living                  Review of Systems  Constitutional: Positive for chills.       Sweating and chills.  HENT: Negative.   Eyes: Negative.   Respiratory: Negative.  Negative for cough and shortness of breath.        She was recently on Levaquin for a sinus infection.  Cardiovascular: Negative for chest pain.  Gastrointestinal: Positive for nausea. Negative for vomiting and abdominal pain.       She is on MiraLAX so that her bowels move.  Genitourinary:       She has a neurogenic bladder, and has to be catheterized every 3 hours.  Musculoskeletal: Negative for myalgias (she has a scabbed over sore on the right leg.).  Skin: Negative.   Neurological: Positive for weakness. Negative for headaches.  Psychiatric/Behavioral: Negative.     Allergies  Iodine and Morphine and related  Home Medications   Current Outpatient Rx  Name  Route  Sig  Dispense  Refill  . amLODipine (NORVASC) 10 MG tablet   Oral   Take 1 tablet (10 mg total) by mouth every morning.   30 tablet   5   . amphetamine-dextroamphetamine (ADDERALL) 10 MG tablet   Oral   Take 1 tablet (10 mg total) by mouth 4 (four) times daily.   120 tablet   0   . arformoterol (BROVANA) 15 MCG/2ML NEBU   Nebulization   Take 2 mLs (15 mcg total) by nebulization 2 (two) times daily.   120 mL   1   . budesonide (PULMICORT) 0.25 MG/2ML nebulizer solution   Nebulization   Take 2 mLs (0.25 mg total) by nebulization 2 (two) times daily.   60 mL   3   . CALCIUM-VITAMIN D PO   Oral   Take 1 tablet by mouth daily.          . cetirizine (ZYRTEC) 10 MG tablet   Oral   Take 10 mg by mouth daily.           Marland Kitchen denosumab (PROLIA) 60 MG/ML SOLN injection   Subcutaneous   Inject 60 mg into the skin every 6 (six) months. Administer in upper arm, thigh, or  abdomen         . dexlansoprazole (DEXILANT) 60 MG capsule   Oral   Take 1 capsule (60  mg total) by mouth daily.   30 capsule   12   . diclofenac sodium (VOLTAREN) 1 % GEL   Topical   Apply 1 application topically 4 (four) times daily as needed. Applies to fingers and hands for gout.         Marland Kitchen HYDROcodone-homatropine (HYCODAN) 5-1.5 MG/5ML syrup   Oral   Take 5 mLs by mouth as needed. For cough.         Marland Kitchen levofloxacin (LEVAQUIN) 500 MG tablet   Oral   Take 500 mg by mouth daily.         Marland Kitchen levothyroxine (SYNTHROID, LEVOTHROID) 25 MCG tablet   Oral   Take 1 tablet (25 mcg total) by mouth daily.   30 tablet   2   . lidocaine (LIDODERM) 5 %   Transdermal   Place 2 patches onto the skin every morning. Remove & Discard patch within 12 hours or as directed by MD. Applies to back and neck.   60 patch   6   . mometasone (NASONEX) 50 MCG/ACT nasal spray   Nasal   Place 2 sprays into the nose daily.   17 g   2   . montelukast (SINGULAIR) 10 MG tablet   Oral   Take 10 mg by mouth at bedtime.         . ondansetron (ZOFRAN-ODT) 4 MG disintegrating tablet   Oral   Take 1 tablet (4 mg total) by mouth every 8 (eight) hours as needed for nausea.   30 tablet   0   . oxyCODONE-acetaminophen (PERCOCET) 10-325 MG per tablet   Oral   Take 1 tablet by mouth every 4 (four) hours as needed for pain. For breakthrough pain. Stated she hasn't had to use since starting on Oxycontin 12h   60 tablet   0   . oxymorphone (OPANA ER) 20 MG 12 hr tablet   Oral   Take 20 mg by mouth as needed.          Marland Kitchen PHENobarbital (LUMINAL) 32.4 MG tablet   Oral   Take 1-2 tablets (32.4-64.8 mg total) by mouth 4 (four) times daily -  with meals and at bedtime. She takes one tablet at 6 am, one tablet at noon and two tabletss at 8 pm.   120 tablet   5   . polyethylene glycol (MIRALAX / GLYCOLAX) packet   Oral   Take 17 g by mouth at bedtime.   14 each      . predniSONE (DELTASONE) 20 MG tablet   Oral   Take 20 mg by mouth daily.         . simvastatin (ZOCOR) 40 MG  tablet   Oral   Take 1 tablet (40 mg total) by mouth at bedtime.   30 tablet   3   . theophylline (THEODUR) 200 MG 12 hr tablet      Two by mouth twice a day   60 tablet   6   . Vitamin D, Ergocalciferol, (DRISDOL) 50000 UNITS CAPS      TAKE 1 CAPSULE EVERY WEEK   5 capsule   5     BP 144/71  Pulse 78  Temp(Src) 98.6 F (37 C) (Oral)  Resp 20  Ht 5\' 2"  (1.575 m)  Wt 129 lb (58.514 kg)  BMI 23.59 kg/m2  SpO2 100%  Physical Exam  Nursing note and vitals reviewed. Constitutional: She is oriented to person, place, and time. She appears well-developed and well-nourished. No distress.  HENT:  Head: Normocephalic and atraumatic.  Right Ear: External ear normal.  Left Ear: External ear normal.  Eyes: Conjunctivae and EOM are normal. Pupils are equal, round, and reactive to light.  Neck: Normal range of motion. Neck supple. No thyromegaly present.  Cardiovascular: Normal rate, regular rhythm and normal heart sounds.   Pulmonary/Chest: Effort normal and breath sounds normal.  Abdominal: Soft. Bowel sounds are normal.  Musculoskeletal: Normal range of motion.  Neurological: She is alert and oriented to person, place, and time.  No sensory or motor deficit.  Skin: Skin is warm and dry.  Psychiatric: She has a normal mood and affect. Her behavior is normal.    ED Course  Procedures (including critical care time)  Labs Reviewed  CBC WITH DIFFERENTIAL  COMPREHENSIVE METABOLIC PANEL  URINALYSIS, ROUTINE W REFLEX MICROSCOPIC  TSH   7:51 AM Pt was seen and had physical examination.  Lab workup was ordered.  7:54 AM  Date: 08/20/2012  Rate:95  Rhythm: normal sinus rhythm  QRS Axis: left  Intervals: normal QRS:  Low voltage in frontal leads; poor R wave progression in precordial leads suggests old anterior myocardial infarction; Q waves in inferior leads suggest old inferior myocardial infarction.  ST/T Wave abnormalities: normal  Conduction Disutrbances:none   Narrative Interpretation: Abnormal EKg  Old EKG Reviewed: unchanged  Results for orders placed during the hospital encounter of 08/20/12  CBC WITH DIFFERENTIAL      Result Value Range   WBC 4.3  4.0 - 10.5 K/uL   RBC 4.60  3.87 - 5.11 MIL/uL   Hemoglobin 14.2  12.0 - 15.0 g/dL   HCT 16.1  09.6 - 04.5 %   MCV 88.7  78.0 - 100.0 fL   MCH 30.9  26.0 - 34.0 pg   MCHC 34.8  30.0 - 36.0 g/dL   RDW 40.9  81.1 - 91.4 %   Platelets 241  150 - 400 K/uL   Neutrophils Relative 44  43 - 77 %   Neutro Abs 1.9  1.7 - 7.7 K/uL   Lymphocytes Relative 36  12 - 46 %   Lymphs Abs 1.6  0.7 - 4.0 K/uL   Monocytes Relative 10  3 - 12 %   Monocytes Absolute 0.5  0.1 - 1.0 K/uL   Eosinophils Relative 9 (*) 0 - 5 %   Eosinophils Absolute 0.4  0.0 - 0.7 K/uL   Basophils Relative 1  0 - 1 %   Basophils Absolute 0.0  0.0 - 0.1 K/uL  COMPREHENSIVE METABOLIC PANEL      Result Value Range   Sodium 139  135 - 145 mEq/L   Potassium 4.3  3.5 - 5.1 mEq/L   Chloride 106  96 - 112 mEq/L   CO2 24  19 - 32 mEq/L   Glucose, Bld 84  70 - 99 mg/dL   BUN 18  6 - 23 mg/dL   Creatinine, Ser 7.82  0.50 - 1.10 mg/dL   Calcium 8.7  8.4 - 95.6 mg/dL   Total Protein 7.5  6.0 - 8.3 g/dL   Albumin 4.1  3.5 - 5.2 g/dL   AST 24  0 - 37 U/L   ALT 14  0 - 35 U/L   Alkaline Phosphatase 122 (*) 39 - 117 U/L   Total Bilirubin 0.2 (*) 0.3 -  1.2 mg/dL   GFR calc non Af Amer 86 (*) >90 mL/min   GFR calc Af Amer >90  >90 mL/min  URINALYSIS, ROUTINE W REFLEX MICROSCOPIC      Result Value Range   Color, Urine YELLOW  YELLOW   APPearance CLEAR  CLEAR   Specific Gravity, Urine 1.024  1.005 - 1.030   pH 7.0  5.0 - 8.0   Glucose, UA NEGATIVE  NEGATIVE mg/dL   Hgb urine dipstick NEGATIVE  NEGATIVE   Bilirubin Urine NEGATIVE  NEGATIVE   Ketones, ur 40 (*) NEGATIVE mg/dL   Protein, ur NEGATIVE  NEGATIVE mg/dL   Urobilinogen, UA 0.2  0.0 - 1.0 mg/dL   Nitrite NEGATIVE  NEGATIVE   Leukocytes, UA NEGATIVE  NEGATIVE   Dg Chest 2  View  08/20/2012  *RADIOLOGY REPORT*  Clinical Data: Night sweats, fever/chills, palpitations  CHEST - 2 VIEW  Comparison: 02/05/2012  Findings: No focal consolidation.  Calcified granuloma in the left lower lobe.  No pleural effusion or pneumothorax.  The heart is normal in size.  Degenerative changes of the visualized thoracolumbar spine.  IMPRESSION: No evidence of acute cardiopulmonary disease.   Original Report Authenticated By: Charline Bills, M.D.     UA showed Ketones 40, suggesting some degree of dehydration.  Other lab tests negative.  Advised to drink 6 to 8 glasses of liquid per day, F/U with Dr. Abner Greenspan in the office.  1. Dehydration         Carleene Cooper III, MD 08/20/12 [phone]

## 2012-08-20 NOTE — ED Notes (Signed)
Pt states "I changed my mind, I will go to the car in a wheelchair, I'm feeling a little weak..." [name] transports pt to checkout desk in w/c.

## 2012-08-20 NOTE — Progress Notes (Addendum)
Subjective:    Patient ID: [name], female    DOB: May 22, 1933, 79 y.o.   MRN: [id number]  HPI  [name] is a very nice 79 year old white female known to Dr. Rhea Belton who is suffering from a chronic idiopathic demyelinating polyneuropathy. In addition she has COPD, chronic anemia, GERD, hypertension, and a history of a large adenomatous colon polyp for which she  underwent a right hemicolectomy. She has gastroparesis which is felt to be on the basis of chronic narcotics and her polyneuropathy. She has had chronic problems with intermittent nausea and dry heaves. She was last seen in December of 2013 and at that time was using Zofran when necessary and had been given a prescription for metoclopramide but says she has never tried it. She comes in today as an urgent add-on with complaints of progressive problems with nausea dry heaves and vomiting over the past week. She developed a sinus infection about a month ago was treated with a course of Augmentin and that was then followed by a 14 day course of Levaquin which she just finished 2 days ago.  She says during that time she was on antibiotics her nausea became worse and now over the past week she is gotten to the point where she's not eating or drinking much of anything and is having frequent dry heaves during the day and intermittent emesis over the past couple of days. She says she feels very fatigued and weak. She's not been having any diarrhea. Her husband is concerned because she had an episode of diaphoresis last evening followed by chills. He felt that she had been a bit disoriented as well. She was seen in the emergency room earlier today and had baseline labs done all of which were unremarkable with the exception of UA positive for ketones. Chest x-ray was negative. Unfortunately though she felt dehydrated she was not given any IV fluids while she was in the emergency room.     Review of Systems  Constitutional: Positive for activity change,  appetite change and fatigue.  HENT: Negative.   Eyes: Negative.   Respiratory: Negative.   Cardiovascular: Negative.   Gastrointestinal: Positive for nausea and vomiting.  Endocrine: Positive for cold intolerance.  Genitourinary: Positive for enuresis.  Musculoskeletal: Positive for back pain.  Allergic/Immunologic: Negative.   Neurological: Positive for weakness.  Hematological: Negative.   Psychiatric/Behavioral: Negative.    Outpatient Prescriptions Prior to Visit  Medication Sig Dispense Refill  . amLODipine (NORVASC) 10 MG tablet Take 1 tablet (10 mg total) by mouth every morning.  30 tablet  5  . amphetamine-dextroamphetamine (ADDERALL) 10 MG tablet Take 1 tablet (10 mg total) by mouth 4 (four) times daily.  120 tablet  0  . arformoterol (BROVANA) 15 MCG/2ML NEBU Take 2 mLs (15 mcg total) by nebulization 2 (two) times daily.  120 mL  1  . budesonide (PULMICORT) 0.25 MG/2ML nebulizer solution Take 2 mLs (0.25 mg total) by nebulization 2 (two) times daily.  60 mL  3  . CALCIUM-VITAMIN D PO Take 1 tablet by mouth daily.       . cetirizine (ZYRTEC) 10 MG tablet Take 10 mg by mouth daily.        Marland Kitchen denosumab (PROLIA) 60 MG/ML SOLN injection Inject 60 mg into the skin every 6 (six) months. Administer in upper arm, thigh, or abdomen      . dexlansoprazole (DEXILANT) 60 MG capsule Take 1 capsule (60 mg total) by mouth daily.  30 capsule  12  .  diclofenac sodium (VOLTAREN) 1 % GEL Apply 1 application topically 4 (four) times daily as needed. Applies to fingers and hands for gout.      Marland Kitchen HYDROcodone-homatropine (HYCODAN) 5-1.5 MG/5ML syrup Take 5 mLs by mouth as needed. For cough.      Marland Kitchen levofloxacin (LEVAQUIN) 500 MG tablet Take 500 mg by mouth daily.      Marland Kitchen levothyroxine (SYNTHROID, LEVOTHROID) 25 MCG tablet Take 1 tablet (25 mcg total) by mouth daily.  30 tablet  2  . lidocaine (LIDODERM) 5 % Place 2 patches onto the skin every morning. Remove & Discard patch within 12 hours or as directed  by MD. Applies to back and neck.  60 patch  6  . mometasone (NASONEX) 50 MCG/ACT nasal spray Place 2 sprays into the nose daily.  17 g  2  . montelukast (SINGULAIR) 10 MG tablet Take 10 mg by mouth at bedtime.      . ondansetron (ZOFRAN-ODT) 4 MG disintegrating tablet Take 1 tablet (4 mg total) by mouth every 8 (eight) hours as needed for nausea.  30 tablet  0  . oxyCODONE-acetaminophen (PERCOCET) 10-325 MG per tablet Take 1 tablet by mouth every 4 (four) hours as needed for pain. For breakthrough pain. Stated she hasn't had to use since starting on Oxycontin 12h  60 tablet  0  . oxymorphone (OPANA ER) 20 MG 12 hr tablet Take 20 mg by mouth as needed.       Marland Kitchen PHENobarbital (LUMINAL) 32.4 MG tablet Take 1-2 tablets (32.4-64.8 mg total) by mouth 4 (four) times daily -  with meals and at bedtime. She takes one tablet at 6 am, one tablet at noon and two tabletss at 8 pm.  120 tablet  5  . polyethylene glycol (MIRALAX / GLYCOLAX) packet Take 17 g by mouth at bedtime.  14 each    . predniSONE (DELTASONE) 20 MG tablet Take 20 mg by mouth daily.      . simvastatin (ZOCOR) 40 MG tablet Take 1 tablet (40 mg total) by mouth at bedtime.  30 tablet  3  . theophylline (THEODUR) 200 MG 12 hr tablet Two by mouth twice a day  60 tablet  6  . Vitamin D, Ergocalciferol, (DRISDOL) 50000 UNITS CAPS TAKE 1 CAPSULE EVERY WEEK  5 capsule  5   No facility-administered medications prior to visit.   Allergies  Allergen Reactions  . Iodine Other (See Comments)    Cardiac arrest  . Morphine And Related Other (See Comments)    Cardiac arrest   Patient Active Problem List  Diagnosis  . Anemia  . Asthma  . COPD (chronic obstructive pulmonary disease) gold stage D.  . Depression  . GERD (gastroesophageal reflux disease)  . Hyperlipidemia  . Hypertension  . Allergic rhinitis  . Pulmonary embolism  . Skin cancer  . Seizures  . CIDP (chronic inflammatory demyelinating polyneuropathy)  . Chronic neck pain  .  Rectocele  . Hypersomnia  . Vitamin d deficiency  . Family history of colon cancer  . Abnormal blood chemistry  . Skin lesion  . Adenoma of cecum  . Chronic constipation  . Osteoporosis  . Malignant hyperthermia  . Hypokalemia  . Fecal incontinence  . Neurogenic bladder  . Fatigue  . Muscle cramp  . Gastroparesis  . Thyroid disease  . Sinusitis  . Nondiabetic gastroparesis   History  Substance Use Topics  . Smoking status: Former Smoker -- 0.50 packs/day for 3 years  Types: Cigarettes    Quit date: 05/29/1978  . Smokeless tobacco: Never Used  . Alcohol Use: No   family history includes Allergies in her sister; Cancer in her cousin, father, and sisters; Colon cancer in her father; Heart disease in her mother; Hyperlipidemia in her maternal aunt and mother; Lung cancer in her sister; Ovarian cancer in her sister; Stomach cancer in an unspecified family member; and Thyroid cancer in her sister.     Objective:   Physical Exam Well-developed thin older white female, chronically ill-appearing in no acute distress, accompanied by her husband. Blood pressure 130/80 pulse 88 temp 98 3 height 5 foot 2 weight 124. HEENT; nontraumatic normocephalic EOMI PERRLA sclera are anicteric oropharynx is somewhat dry no evidence of candidiasis, Neck;Supple no JVD, Cardiovascular;regular rate and rhythm with S1-S2 no murmur or gallop, Pulmonary; clear bilaterally, Abdomen; soft nondistended bowel sounds are active there is no succussion splash no palpable mass or hepatosplenomegaly nontender, Rectal ;exam not done, Extremities; no clubbing, cyanosis, or edema skin warm and dry,Psych;mood and affect appropriate.       Assessment & Plan:   #68  79 year old female with a chronic idiopathic demyelinating polyneuropathy which has been complicated by a neurogenic bladder and fecal incontinence as well as gastroparesis . Patient now with progressive nausea and vomiting after a long course of antibiotics  taken for a sinus infection. I suspect her nausea has been a side effect of the Levaquin at may in addition have caused an exacerbation of her gastroparesis . #2 asthma/COPD  #3 history of right hemicolectomy for large adenomatous polyp  #4 chronic narcotic use  #5 chronic anemia  #6 history of seizure disorder  #7 thyroid disease  Plan long discussion with the patient her husband and daughter-in-law She is encouraged to keep a container of oral fluids with her at all times and to sip on fluids throughout the day. We discussed a full liquid to  very soft diet with frequent small feedings; She will continue Dexilant 60 mg by mouth every morning  We readdressed the use of metoclopramide might and discuss potential neurologic side effects. I think she will benefit from prokinetic and she is willing to try the metoclopramide 5 mg one half hour before each meal and is asked to take this regularly over the next couple of weeks until her symptoms have settled down. She is aware that should she develop any neurologic symptoms to discontinue the medication and let us know.  Continue Zofran 4 mg every 6 hours and have asked her to take this scheduled rather than as needed  They are asked to call the progress report in 24-48 hours-if her oral intake does not improve over the next day or 2 she may require admission for hydration   Addendum: Reviewed and agree with management in this patient with complex medication problems. Beverley Fiedler, MD

## 2012-08-20 NOTE — ED Notes (Signed)
Pt states that she has no dizziness at this time, pt only complaint at this time is nausea. Pt is alert and oriented x 4. Pt states she was apprehensive and upset at home when she woke in a bed of sweat but feels better now.

## 2012-08-20 NOTE — ED Notes (Signed)
MD at bedside. 

## 2012-08-20 NOTE — ED Notes (Signed)
Pt family members given coffee and snacks. Pt awaits md eval, states her nausea and dizzyness have subsided.

## 2012-08-20 NOTE — Discharge Instructions (Signed)
Dehydration, Adult Dehydration is when you lose more fluids from the body than you take in. Vital organs like the kidneys, brain, and heart cannot function without a proper amount of fluids and salt. Any loss of fluids from the body can cause dehydration.  CAUSES   Vomiting.  Diarrhea.  Excessive sweating.  Excessive urine output.  Fever. SYMPTOMS  Mild dehydration  Thirst.  Dry lips.  Slightly dry mouth. Moderate dehydration  Very dry mouth.  Sunken eyes.  Skin does not bounce back quickly when lightly pinched and released.  Dark urine and decreased urine production.  Decreased tear production.  Headache. Severe dehydration  Very dry mouth.  Extreme thirst.  Rapid, weak pulse (more than 100 beats per minute at rest).  Cold hands and feet.  Not able to sweat in spite of heat and temperature.  Rapid breathing.  Blue lips.  Confusion and lethargy.  Difficulty being awakened.  Minimal urine production.  No tears. DIAGNOSIS  Your caregiver will diagnose dehydration based on your symptoms and your exam. Blood and urine tests will help confirm the diagnosis. The diagnostic evaluation should also identify the cause of dehydration. TREATMENT  Treatment of mild or moderate dehydration can often be done at home by increasing the amount of fluids that you drink. It is best to drink small amounts of fluid more often. Drinking too much at one time can make vomiting worse. Refer to the home care instructions below. Severe dehydration needs to be treated at the hospital where you will probably be given intravenous (IV) fluids that contain water and electrolytes. HOME CARE INSTRUCTIONS   Ask your caregiver about specific rehydration instructions.  Drink enough fluids to keep your urine clear or pale yellow.  Drink small amounts frequently if you have nausea and vomiting.  Eat as you normally do.  Avoid:  Foods or drinks high in sugar.  Carbonated  drinks.  Juice.  Extremely hot or cold fluids.  Drinks with caffeine.  Fatty, greasy foods.  Alcohol.  Tobacco.  Overeating.  Gelatin desserts.  Wash your hands well to avoid spreading bacteria and viruses.  Only take over-the-counter or prescription medicines for pain, discomfort, or fever as directed by your caregiver.  Ask your caregiver if you should continue all prescribed and over-the-counter medicines.  Keep all follow-up appointments with your caregiver. SEEK MEDICAL CARE IF:  You have abdominal pain and it increases or stays in one area (localizes).  You have a rash, stiff neck, or severe headache.  You are irritable, sleepy, or difficult to awaken.  You are weak, dizzy, or extremely thirsty. SEEK IMMEDIATE MEDICAL CARE IF:   You are unable to keep fluids down or you get worse despite treatment.  You have frequent episodes of vomiting or diarrhea.  You have blood or green matter (bile) in your vomit.  You have blood in your stool or your stool looks black and tarry.  You have not urinated in 6 to 8 hours, or you have only urinated a small amount of very dark urine.  You have a fever.  You faint. MAKE SURE YOU:   Understand these instructions.  Will watch your condition.  Will get help right away if you are not doing well or get worse. Document Released: 05/15/2005 Document Revised: 08/07/2011 Document Reviewed: 01/02/2011 ExitCare Patient Information 2013 [employer].  

## 2012-08-20 NOTE — Telephone Encounter (Signed)
Pt of Dr Lauro Franklin with hx of hx of CIDP and gastroparesis last seen by Dr Rhea Belton on 05/06/12 for nausea. She was placed on a Gastroparesis diet and did well until a couple of weeks ago when she developed nausea. The nausea has gotten worse the appt 2-3 days. She woke up early this am in a pool of sweat, was very weak and asked her husband to take her to the ER. Pt arrived at the change of shift and Saline Loc was inserted, but no fluids were given. UA showed Ketones but the doc felt that was d/t dehydration so she was encouraged to increase her po intake. She states d/t her gastroparesis she can't drink enough to reverse the dehydration.. She has been on a 14 day script of Levaquin for a sinus infection, but she denies diarrhea, she takes Miralax for constipation. Pt was given an appt with Mike Gip, PA today.

## 2012-08-22 ENCOUNTER — Telehealth: Payer: Self-pay

## 2012-08-22 NOTE — Telephone Encounter (Signed)
Thanks, glad she is better- can we get her a follow up with me or Dr. Rhea Belton next week.[name]

## 2012-08-22 NOTE — Telephone Encounter (Signed)
Pt has an OV already scheduled with Dr. Rhea Belton for 08/30/12.

## 2012-08-22 NOTE — Telephone Encounter (Signed)
Spoke with pt and she states that she is following [name]'s instructions, she is sipping liquids all through the day. She took her nausea meds this am and they took a while to start working but they finally kicked in, states she is not having dry heaves. Pt states she is feeling better. She knows to call us back with any further problems.

## 2012-08-22 NOTE — Telephone Encounter (Signed)
Message copied by Chrystie Nose on Thu Aug 22, 2012 10:45 AM ------      Message from: Luana Tatro, [place] R      Created: Wed Aug 21, 2012 12:28 PM      Regarding: FW: Schreiter mylei       Call pt Thursday            ----- Message -----         From: Sammuel Cooper, PA-C         Sent: 08/21/2012  12:15 PM           To: Linna Hoff, RN      Subject: Brancato De Burrs- I saw this pt yesterday-lots  of trouble with nausea, vomiting, and teetering on dehydration. I told them we would check on her Thursday- if she is not any better she may need to be admitted       ------

## 2012-08-28 ENCOUNTER — Encounter: Payer: Self-pay | Admitting: Internal Medicine

## 2012-08-29 ENCOUNTER — Telehealth: Payer: Self-pay

## 2012-08-29 NOTE — Telephone Encounter (Signed)
Pt states she is very exhausted and insisted on seeing Dr. Marjory Lies as so she values his opinion greatly and feels she needs an neurological exam.  Sched appt for 09/03/12.

## 2012-08-30 ENCOUNTER — Encounter: Payer: Self-pay | Admitting: Internal Medicine

## 2012-08-30 ENCOUNTER — Ambulatory Visit (INDEPENDENT_AMBULATORY_CARE_PROVIDER_SITE_OTHER): Payer: Medicare Other | Admitting: Internal Medicine

## 2012-08-30 VITALS — BP 142/86 | HR 96 | Ht 61.75 in | Wt 127.0 lb

## 2012-08-30 DIAGNOSIS — R112 Nausea with vomiting, unspecified: Secondary | ICD-10-CM

## 2012-08-30 DIAGNOSIS — IMO0002 Reserved for concepts with insufficient information to code with codable children: Secondary | ICD-10-CM

## 2012-08-30 DIAGNOSIS — K3184 Gastroparesis: Secondary | ICD-10-CM

## 2012-08-30 DIAGNOSIS — N8110 Cystocele, unspecified: Secondary | ICD-10-CM

## 2012-08-30 DIAGNOSIS — R159 Full incontinence of feces: Secondary | ICD-10-CM

## 2012-08-30 DIAGNOSIS — D126 Benign neoplasm of colon, unspecified: Secondary | ICD-10-CM

## 2012-08-30 MED ORDER — ONDANSETRON 4 MG PO TBDP: 4.0000 mg | ORAL_TABLET | Freq: Three times a day (TID) | ORAL | Status: DC | PRN

## 2012-08-30 NOTE — Patient Instructions (Addendum)
You have been scheduled for an MRI at [facility]  On4/01/2013 . Your appointment time is 3:00pm . Please arrive 15 minutes prior to your appointment time for registration purposes. There is no prep for this test. However, if you have any metal in your body, have a pacemaker or defibrillator, please be sure to let your ordering physician know. This test typically takes 45 minutes to 1 hour to complete.   You will be contacted about your referrals to a Gynecologist. And to a Primary care physician.    You can continue to take the miralax cocktail.  Back off taking Reglan and continue taking Zofran as needed for nausea.                                               We are excited to introduce MyChart, a new best-in-class service that provides you online access to important information in your electronic medical record. We want to make it easier for you to view your health information - all in one secure location - when and where you need it. We expect MyChart will enhance the quality of care and service we provide.  When you register for MyChart, you can:    View your test results.    Request appointments and receive appointment reminders via email.    Request medication renewals.    View your medical history, allergies, medications and immunizations.    Communicate with your physician's office through a password-protected site.    Conveniently print information such as your medication lists.  To find out if MyChart is right for you, please talk to a member of our clinical staff today. We will gladly answer your questions about this free health and wellness tool.  If you are age 8 or older and want a member of your family to have access to your record, you must provide written consent by completing a proxy form available at our office. Please speak to our clinical staff about guidelines regarding accounts for patients younger than age 58.  As you activate your MyChart account and  need any technical assistance, please call the MyChart technical support line at [phone]) or email your question to [email or web address]. If you email your question(s), please include your name, a return phone number and the best time to reach you.  If you have non-urgent health-related questions, you can send a message to our office through MyChart at [email or web address]. If you have a medical emergency, call 911.  Thank you for using MyChart as your new health and wellness resource!   MyChart licensed from [employer],  [id number]. Patents Pending.

## 2012-08-30 NOTE — Progress Notes (Signed)
Subjective:    Patient ID: [name], female    DOB: 02-01-1933, 79 y.o.   MRN: [id number]  HPI [name] is a 79 yo female with complicated PMH of CIDP, COPD, anemia, GERD, hypertension, hyperlipidemia, rectocele, adenomatous colon polyps status post right hemicolectomy for large sessile serrated adenoma, and gastroparesis who seen for followup. She was last seen recently in the office by Mike Gip, PA-C and an evaluation for nausea and vomiting felt to be a flare of her gastroparesis. At that time her Zofran was scheduled and she was started on Reglan 5 mg 3 times daily. Reglan had been previously prescribed she had never used it. She reports that her upper GI symptoms, nausea, vomiting and dry heaves have completely improved. She is still following a gastroparesis diet and is taking scheduled Zofran and Reglan 5 mg 3 times a day.  She has noticed more frequent stools and more fecal incontinence with the Reglan. She did complete levofloxacin for sinus infection and no symptoms also improved. She remains very tired. She and her husband report she has having vaginal fullness, dyspareunia, and a palpable protrusion from the vaginal canal. Her husband describes this as pink in color, ridged, and able to be reduced. He sees this during her in and out catheterization. They continue to perform self cath every 3 hours. Otherwise she has near-total urinary incontinence. She's continued to be followed closely by neurology and has a second opinion neurology visit coming up at [facility]. She's also followed by hematology, endocrinology, but is requesting a new primary care provider.  Review of Systems As per HPI, otherwise neg  Current Medications, Allergies, Past Medical History, Past Surgical History, Family History and Social History were reviewed in [employer] record.     Objective:   Physical Exam BP 142/86  Pulse 96  Ht 5' 1.75" (1.568 m)  Wt 127 lb (57.607 kg)  BMI  23.43 kg/m2 Constitutional: Well-developed and well-nourished. No distress. HEENT: Normocephalic and atraumatic.No scleral icterus. Neck: Neck supple. Trachea midline. Cardiovascular: Normal rate, regular rhythm and intact distal pulses.  Pulmonary/chest: Effort normal and breath sounds normal. No wheezing, rales or rhonchi. Abdominal: Soft, nontender, nondistended. Bowel sounds active throughout. Extremities: no clubbing, cyanosis, or edema, change if arthritis b/l hands Neurological: Alert and oriented to person place and time. Skin: Skin is warm and dry. No rashes noted. Psychiatric: Normal mood and affect. Behavior is normal.  CBC    Component Value Date/Time   WBC 4.3 08/20/2012 0848   WBC 6.8 07/24/2012 1328   RBC 4.60 08/20/2012 0848   HGB 14.2 08/20/2012 0848   HGB 14.0 07/24/2012 1328   HCT 40.8 08/20/2012 0848   HCT 41.0 07/24/2012 1328   PLT 241 08/20/2012 0848   PLT 270 07/24/2012 1328   MCV 88.7 08/20/2012 0848   MCV 90 07/24/2012 1328   MCH 30.9 08/20/2012 0848   MCH 30.6 07/24/2012 1328   MCHC 34.8 08/20/2012 0848   MCHC 34.1 07/24/2012 1328   RDW 13.6 08/20/2012 0848   RDW 13.7 07/24/2012 1328   LYMPHSABS 1.6 08/20/2012 0848   LYMPHSABS 2.8 07/24/2012 1328   MONOABS 0.5 08/20/2012 0848   EOSABS 0.4 08/20/2012 0848   EOSABS 0.5 07/24/2012 1328   BASOSABS 0.0 08/20/2012 0848   BASOSABS 0.0 07/24/2012 1328    CMP     Component Value Date/Time   NA 139 08/20/2012 0848   K 4.3 08/20/2012 0848   CL 106 08/20/2012 0848   CO2  24 08/20/2012 0848   GLUCOSE 84 08/20/2012 0848   BUN 18 08/20/2012 0848   CREATININE 0.60 08/20/2012 0848   CREATININE 0.78 06/27/2012 1139   CALCIUM 8.7 08/20/2012 0848   PROT 7.5 08/20/2012 0848   ALBUMIN 4.1 08/20/2012 0848   AST 24 08/20/2012 0848   ALT 14 08/20/2012 0848   ALKPHOS 122* 08/20/2012 0848   BILITOT 0.2* 08/20/2012 0848   GFRNONAA 86* 08/20/2012 0848   GFRAA >90 08/20/2012 0848       Assessment & Plan:  79 yo female with complicated PMH of CIDP,  COPD, anemia, GERD, hypertension, hyperlipidemia, rectocele, adenomatous colon polyps status post right hemicolectomy for large sessile serrated adenoma, and gastroparesis who seen for followup   1. Gastroparesis/nausea -- much improvement with scheduled Zofran and metoclopramide. At this point I would like her to continue to gastroparesis diet and scheduled Zofran. I would like for her to try and discontinue Reglan. She is aware of the potential long-term complications of metoclopramide therapy, specifically the neurologic side effects, and I have recommended using this for the shortest time necessary and the lowest dose necessary. She can continue to use metoclopramide 5 mg on an as-needed basis if symptoms persist despite scheduled Zofran.    2.  Vaginal prolapse/dyspareunia  -- refer to gynecology.  I have recommended a pelvic MRI to help with evaluation of this issue.  3.  Constipation/fecal incontinence -- she does develop left lower corner pain if she has not had a bowel movement. I recommend he continue MiraLAX which seems to work well for her. I think that her recent increased stooling will decrease with discontinuation of metoclopramide. If this is not the case she is asked to notify us.  4.  Anemia - hemoglobin currently normal. She has had IV iron in the past and is followed by hematology  5.  Hx of large tubular adenoma s/p right hemi-colectomy --  at this point given her other ongoing and more pressing medical issues, we will defer colonoscopy at this point. The large adenoma that was resected was not a malignancy. Can discuss this further in followup. She understands and is in agreement  Return in 3-6 months, sooner if needed

## 2012-09-02 ENCOUNTER — Telehealth: Payer: Self-pay | Admitting: Gastroenterology

## 2012-09-02 ENCOUNTER — Encounter: Payer: Self-pay | Admitting: Family Medicine

## 2012-09-02 NOTE — Telephone Encounter (Signed)
Called and lvm for referral dept. To call me back regarding the referral of pt to Dr. Jackelyn Knife. York Spaniel i was faxing over records and would like someone to call me back to confirm records were received.

## 2012-09-02 NOTE — Telephone Encounter (Signed)
Spoke to Jo-anne at [facility] for PCP referral for pt. She said she will schedule it with pt and let us know of appointment.  Faxed pt's records to jo-anne

## 2012-09-03 ENCOUNTER — Ambulatory Visit (INDEPENDENT_AMBULATORY_CARE_PROVIDER_SITE_OTHER): Payer: Medicare Other | Admitting: Diagnostic Neuroimaging

## 2012-09-03 ENCOUNTER — Telehealth: Payer: Self-pay | Admitting: Internal Medicine

## 2012-09-03 ENCOUNTER — Encounter: Payer: Self-pay | Admitting: Diagnostic Neuroimaging

## 2012-09-03 VITALS — BP 130/83 | HR 99 | Temp 98.0°F | Ht 63.0 in | Wt 123.0 lb

## 2012-09-03 DIAGNOSIS — G6181 Chronic inflammatory demyelinating polyneuritis: Secondary | ICD-10-CM

## 2012-09-03 NOTE — Progress Notes (Signed)
[facility]  PATIENT: [name] DOB: August 14, 1932  REFERRING CLINICIAN:  HISTORY FROM: patient and husband REASON FOR VISIT: follow up / urgent   HISTORICAL  CHIEF COMPLAINT:  Chief Complaint  Patient presents with  . Follow-up    HISTORY OF PRESENT ILLNESS:   UPDATE 09/03/12: Since last visit patient now developing increasing muscle spasms in her neck and her bilateral calves. She feels that her gait is deteriorating further. She's having increasing pain, typically at nighttime it wakes her up from sleep. She does report energy level and prefers to stay at home and inside. She insisted she's not depressed, however she does feel you're able to 2 the severity of her symptoms. Patient continues to struggle with nausea, vomiting, fecal incontinence, urinary incontinence and continues to need in and out catheterization by her husband  every 3 hours. [facility] second opinion appt setup for June 2014.  UPDATE 05/01/12: Since last visit, had shortness of breath, found to have anemia (Hb 9.2) and then IVIG on hold. No benefit with IVIG. Gastroparesis and bladder incontinence (with sig post-void residuals) are geeting worse. Now having to self cath with husbands help up to 3x per day.   UPDATE 01/17/12: Now received IVIG treatments in June, July, and Aug. Pt feels mild gait and bladder control benefit. Husband also notes mild improvement. Pt not using a cane anymore, but probably needs it. Depression, memory and mood are not well controlled. Dx'd with gastroparesis by GI.   UPDATE 10/05/11: S/p colon resection (09/07/11). Gait, numbness are stable. Now with bladder incontinence as well as bowel incontinence. Larey Seat recently at home while walking on uneven surface.  UPDATE 07/20/11: S/p IVIG (has been 4 weeks). No initial change, but since yesteraday, feels like gait is improving. Also found to have colon polyp and will need resection in next few weeks. Numbness and GI issues  unchanged.   UPDATE 06/14/11: Still having numbness, and gait diff.  Labs reviewed.  IVIG planned for next week.  Fatigue is more of an issue now.  No falls.  UPDATE 05/03/11: Had cystocele and vaginal mesh repair surgery in June 2012.  Now has developed post-op rectocele.  When she had rectal biopsies taken, she could not feel any pain.  Over last 2 months, constipation has significantly worsened.  Saw GI and started on miralax, which results in diarrhea.  Cannot feel sensation or urge to have BM.  Gait and balance have worsened. Also since last visit, she saw Dr .Venetia Maxon for c-spine stenosis, who recommended decompression and fusion; patient has held off on this.  PRIOR HPI 07/12/10: 79 year old female with hypertension, hypercholesterolemia, chronic pain, chronic fatigue, for evaluation of bilateral hand and foot numbness/tingling for the past 4 years.  Symptoms were initially intermittent but now they're constant. She describes primarily tingling sensation (hands worse than feet).  She is also having difficulty picking up small objects. She is having worsening balance and gait problems over the past 6 months. She tends to fall backwards.  She has extensive surgical history, has had a numerous neurologic evaluations, and pain management evaluations. She also has had radiofrequency ablations in the cervical spine region without relief.  REVIEW OF SYSTEMS: Full 14 system review of systems performed and notable only for extreme fatigue, rash in her forehead, fecal incontinence, feeling cold, joint pain, aching muscles.  ALLERGIES: Allergies  Allergen Reactions  . Iodine Other (See Comments)    Cardiac arrest  . Morphine And Related Other (See Comments)  Cardiac arrest    HOME MEDICATIONS: Outpatient Prescriptions Prior to Visit  Medication Sig Dispense Refill  . amLODipine (NORVASC) 10 MG tablet Take 1 tablet (10 mg total) by mouth every morning.  30 tablet  5  . amphetamine-dextroamphetamine  (ADDERALL) 10 MG tablet Take 1 tablet (10 mg total) by mouth 4 (four) times daily.  120 tablet  0  . arformoterol (BROVANA) 15 MCG/2ML NEBU Take 2 mLs (15 mcg total) by nebulization 2 (two) times daily.  120 mL  1  . budesonide (PULMICORT) 0.25 MG/2ML nebulizer solution Take 2 mLs (0.25 mg total) by nebulization 2 (two) times daily.  60 mL  3  . CALCIUM-VITAMIN D PO Take 1 tablet by mouth daily.       . Camphor-Menthol (SARNA EX) Apply topically as needed.      . cetirizine (ZYRTEC) 10 MG tablet Take 10 mg by mouth daily.        Marland Kitchen denosumab (PROLIA) 60 MG/ML SOLN injection Inject 60 mg into the skin every 6 (six) months. Administer in upper arm, thigh, or abdomen      . dexlansoprazole (DEXILANT) 60 MG capsule Take 1 capsule (60 mg total) by mouth daily.  30 capsule  12  . diclofenac sodium (VOLTAREN) 1 % GEL Apply 1 application topically 4 (four) times daily as needed. Applies to fingers and hands for gout.      Marland Kitchen HYDROcodone-homatropine (HYCODAN) 5-1.5 MG/5ML syrup Take 5 mLs by mouth as needed. For cough.      . levothyroxine (SYNTHROID, LEVOTHROID) 25 MCG tablet Take 1 tablet (25 mcg total) by mouth daily.  30 tablet  2  . lidocaine (LIDODERM) 5 % Place 2 patches onto the skin every morning. Remove & Discard patch within 12 hours or as directed by MD. Applies to back and neck.  60 patch  6  . metoCLOPramide (REGLAN) 5 MG tablet Take 5 mg by mouth 3 (three) times daily.      . mometasone (NASONEX) 50 MCG/ACT nasal spray Place 2 sprays into the nose daily.  17 g  2  . montelukast (SINGULAIR) 10 MG tablet Take 10 mg by mouth at bedtime.      . mupirocin ointment (BACTROBAN) 2 % Apply 1 application topically 2 (two) times daily.      . ondansetron (ZOFRAN-ODT) 4 MG disintegrating tablet Take 1 tablet (4 mg total) by mouth every 8 (eight) hours as needed for nausea.  30 tablet  0  . oxyCODONE-acetaminophen (PERCOCET) 10-325 MG per tablet Take 1 tablet by mouth every 4 (four) hours as needed for pain.  For breakthrough pain. Stated she hasn't had to use since starting on Oxycontin 12h  60 tablet  0  . oxymorphone (OPANA ER) 20 MG 12 hr tablet Take 20 mg by mouth as needed.       Marland Kitchen PHENobarbital (LUMINAL) 32.4 MG tablet Take 1-2 tablets (32.4-64.8 mg total) by mouth 4 (four) times daily -  with meals and at bedtime. She takes one tablet at 6 am, one tablet at noon and two tabletss at 8 pm.  120 tablet  5  . polyethylene glycol (MIRALAX / GLYCOLAX) packet Take 17 g by mouth at bedtime.  14 each    . predniSONE (DELTASONE) 20 MG tablet Take 20 mg by mouth daily.      . simvastatin (ZOCOR) 40 MG tablet Take 1 tablet (40 mg total) by mouth at bedtime.  30 tablet  3  . theophylline (THEODUR) 200 MG  12 hr tablet Two by mouth twice a day  60 tablet  6  . Vitamin D, Ergocalciferol, (DRISDOL) 50000 UNITS CAPS TAKE 1 CAPSULE EVERY WEEK  5 capsule  5   No facility-administered medications prior to visit.    PAST MEDICAL HISTORY: Past Medical History  Diagnosis Date  . Anemia   . Anxiety     patient denied  . Depression     patient denied  . Asthma   . Chronic fatigue syndrome   . COPD (chronic obstructive pulmonary disease)   . GERD (gastroesophageal reflux disease)   . Hyperlipidemia   . Hypertension   . Colon polyp   . Allergic rhinitis   . Vitamin D deficiency   . Pulmonary embolism   . Pneumonia   . Neuropathy   . Arthritis   . 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 Blood transfusion   . Osteoporosis   . Thyroid disease   . Constipation   . Weakness   . Family history of anesthesia complication     Daughter with history of malignant hyperthermia  . Malignant hyperthermia     daughter   . Seizures     1990 last seizures on meds Phenobarb  . Skin cancer     squamous cell  . Neurogenic bladder 2013    husband caths pt  . Thyroid disease 08/08/2012  . Sinusitis 08/08/2012  . Shingles late 26's    PAST SURGICAL HISTORY: Past Surgical History   Procedure Laterality Date  . Tonsillectomy and adenoidectomy    . Nasal septum surgery    . Dilation and curettage of uterus    . Tubal ligation    . Cesarean section    . Sinus surgeries      x 4  . Left ovary and tube removed    . Vocal polyps removed    . Appendectomy    . Cystocele repair    . Carpal tunnel release      right  . Shoulder arthroscopy      x2 left, 1 right  . Left finger fusion    . Right median nerve decompression    . Duptyren's contracture right hand    . Finger ganglion cyst excision      right  . Abdominal hysterectomy    . Joint replacement      right and left basal joints of thumbs  . Bladder suspension    . Cataract extraction      bilateral  . Cervical neck ablation      x 7, C3-C4  . Squamous lesions removed      neck and face  . Basal cell carcinoma excision      face  . Panniculectomy      FAMILY HISTORY: Family History  Problem Relation Age of Onset  . Heart disease Mother   . Hyperlipidemia Mother   . Allergies Sister   . Cancer Sister     ovarian  . Ovarian cancer Sister   . Cancer Sister     lung - stage 1  . Thyroid cancer Sister   . Cancer Sister     thyroid - stage 1  . Lung cancer Sister   . Colon cancer Father   . Cancer Father     colon  . Stomach cancer      first cousin  . Hyperlipidemia Maternal Aunt   . Cancer Cousin     stomach    SOCIAL HISTORY:  History  Social History  . Marital Status: Married    Spouse Name: N/A    Number of Children: 3  . Years of Education: N/A   Occupational History  . retired    Social History Main Topics  . Smoking status: Former Smoker -- 0.50 packs/day for 3 years    Types: Cigarettes    Quit date: 05/29/1978  . Smokeless tobacco: Never Used  . Alcohol Use: No  . Drug Use: No  . Sexually Active: Yes   Other Topics Concern  . Not on file   Social History Narrative      Pt lives at home with her spouse.   Caffeine Use- 2-3 cups of coffee weekly      PHYSICAL EXAM  Filed Vitals:   09/03/12 1351  BP: 130/83  Pulse: 99  Temp: 98 F (36.7 C)  TempSrc: Oral  Height: 5\' 3"  (1.6 m)  Weight: 123 lb (55.792 kg)   Body mass index is 21.79 kg/(m^2).  GENERAL EXAM: General: Patient is awake, alert and in no acute distress. Well developed and groomed. Neck: Neck is supple. Cardiovascular: No carotid artery bruits.  Heart is regular rate and rhythm with no murmurs. Musculoskeletal: Dupuytren's contractures of the hands.  OSTEOARTHRITIS OF HANDS.  Neurologic Exam  Mental Status: Awake, alert.  Language is fluent and comprehension intact. Cranial Nerves: Pupils are PINPOINT and reactive to light.  Visual fields are full to confrontation.  Conjugate eye movements are full and symmetric.  Facial sensation and strength are symmetric.  Hearing is intact.  Palate elevated symmetrically and uvula is midline.  Shoulder shug is symmetric.  Tongue is midline. Motor:  ATROPHY OF HAND INTRINSIC MUSCLES. RIGHT FOOT DF 4/5. OTHERWISE FULL STRENGTH. No pronator drift. Sensory: VIB ABSENT IN TOES AND ANKLES. DECR PP, TEMP, LT IN FEET UP TO KNEES, AND IN BILATERAL HANDS. Coordination: No ataxia or dysmetria on finger-nose or rapid alternating movement testing. Gait and Station: SHORT STEP, UNSTEADY GAIT.  WALKS WITHOUT A CANE. Reflexes: Deep tendon reflexes in the upper and lower extremity are ABSENT THROUGHOUT.   DIAGNOSTIC DATA (LABS, IMAGING, TESTING) - I reviewed patient records, labs, notes, testing and imaging myself where available.  Lab Results  Component Value Date   WBC 4.3 08/20/2012   HGB 14.2 08/20/2012   HCT 40.8 08/20/2012   MCV 88.7 08/20/2012   PLT 241 08/20/2012      Component Value Date/Time   NA 139 08/20/2012 0848   K 4.3 08/20/2012 0848   CL 106 08/20/2012 0848   CO2 24 08/20/2012 0848   GLUCOSE 84 08/20/2012 0848   BUN 18 08/20/2012 0848   CREATININE 0.60 08/20/2012 0848   CREATININE 0.78 06/27/2012 1139   CALCIUM 8.7  08/20/2012 0848   PROT 7.5 08/20/2012 0848   ALBUMIN 4.1 08/20/2012 0848   AST 24 08/20/2012 0848   ALT 14 08/20/2012 0848   ALKPHOS 122* 08/20/2012 0848   BILITOT 0.2* 08/20/2012 0848   GFRNONAA 86* 08/20/2012 0848   GFRAA >90 08/20/2012 0848   Lab Results  Component Value Date   CHOL 157 01/05/2012   HDL 45 01/05/2012   LDLCALC 95 01/05/2012   TRIG 87 01/05/2012   CHOLHDL 3.5 01/05/2012   No results found for this basename: HGBA1C   Lab Results  Component Value Date   VITAMINB12 295 01/19/2012   Lab Results  Component Value Date   TSH 0.872 08/20/2012   LP shows elevated protein (72).  CSF glucose, tropheryma whippeli, anti-Hu,  Vitamin E, ACE all normal. Serum B12 (333), TSH (1.55), A1c (5.5), ANA, ACE, HIV, hepatitis panel, celiac antibodies, SPEP/UPEP all normal.  07/15/10 MRI cervical - C5-6 mild-mod spinal stenosis; no cord signal changes 07/15/10 MRI thoracic - minimal disc bulging T9-10, T10-11 07/15/10 MRI lumbar - L3-4, L4-5 moderate spinal stenosis; bilateral renal cysts  05/24/11 MRI cervical - C5-6 mild-mod spinal stenosis; no cord signal changes; no change from prior 05/24/11 MRI lumbar - L3-4, L4-5 moderate spinal stenosis; bilateral renal cysts; no change from prior  08/08/10 EMG/NCS 1. Moderate to severe, length-dependent, axonal, sensory-motor polyneuropathy. 2. Active denervation and chronic reinnervation needle EMG findings in the right tibialis anterior muscle, may be related to underlying neuropathy. 3. Chronic reinnervation seen in the right flexor carpi radialis and first dorsal interosseous muscles, may be related to prior nerve compression and surgery in the right arm.  ASSESSMENT AND PLAN  79 y.o. female with progressive numbness in bilateral hands and feet. She also has gait instability, sensory abnormalities of the feet and areflexia throughout.  Inital workup confirmed axonal neuropathy and mild-moderate cervical spine stenosis.  Has had progressive gastroparesis,  bowel and bladder incontinence, gait difficulty. LP showed elevated protein. 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  Ddx: neuropathy (CIDP variant/axonal, autoimmune, vascular, toxic, idiopathic)  PLAN: 1. [facility] for second opinion; may need nerve biopsy. Patient is deteriorating clinically over the last few weeks, and therefore I will try to expedite evaluation for second opinion.   Suanne Marker, MD 09/03/2012, 2:11 PM Certified in Neurology, Neurophysiology and Neuroimaging  [facility] [address] [place], [place] [address] [phone]

## 2012-09-03 NOTE — Telephone Encounter (Signed)
Left a message for patient to call me. 

## 2012-09-03 NOTE — Telephone Encounter (Signed)
Called Dr. Meisinger's office to confirm they received records that were faxed over since pt said she has not heard from them. Spoke to brandy she said she spoke with the appointment coordinator and she has not received them, she gave me her fax number and she said she will make sure the Dr. Dorathy Kinsman the records to review today.  [phone]

## 2012-09-03 NOTE — Telephone Encounter (Signed)
Patient states she stopped the Reglan as Dr. Rhea Belton suggested last week. She did ok over the weekend. Yesterday, she had dry heaves and vomiting. She took her Zofran routinely and she took one dose of Reglan. The vomiting and nausea stopped. Today, she is having nausea again. She has taken her Zofran but no Reglan. She is asking for advise on what to do. Please, advise. Also, she is going for her MRI of pelvis today.

## 2012-09-03 NOTE — Telephone Encounter (Signed)
Resume reglan 5 mg TID-AC PRN. If she needs it I am okay with her taking it.  We have discussed that I want her to take it as infreq as possible, but if she needs it, she should use it. zofran -- can be used also for nausea. We will await pelvic MRI Also, please ask her is she heard from GYN about an appt.  We referred her to Dr. Jackelyn Knife, and Kennyth Arnold said they should contact pt directly Thanks

## 2012-09-03 NOTE — Telephone Encounter (Signed)
Patient given Dr.Pyrtle's recommendation. She has not heard from GYN office yet.

## 2012-09-03 NOTE — Patient Instructions (Signed)
Try muscle relaxants for spasm. I will try to get second opinion at [facility] for you sooner.

## 2012-09-04 ENCOUNTER — Other Ambulatory Visit: Payer: Medicare Other

## 2012-09-04 ENCOUNTER — Encounter: Payer: Self-pay | Admitting: Family Medicine

## 2012-09-04 ENCOUNTER — Ambulatory Visit (HOSPITAL_COMMUNITY)
Admission: RE | Admit: 2012-09-04 | Discharge: 2012-09-04 | Disposition: A | Discharge status: Home / Self Care | Payer: Medicare Other | Source: Ambulatory Visit | Attending: Internal Medicine | Admitting: Internal Medicine

## 2012-09-04 DIAGNOSIS — K3184 Gastroparesis: Secondary | ICD-10-CM

## 2012-09-04 DIAGNOSIS — R112 Nausea with vomiting, unspecified: Secondary | ICD-10-CM

## 2012-09-04 DIAGNOSIS — R159 Full incontinence of feces: Secondary | ICD-10-CM

## 2012-09-05 ENCOUNTER — Telehealth: Payer: Self-pay | Admitting: Gastroenterology

## 2012-09-05 NOTE — Telephone Encounter (Signed)
FYI

## 2012-09-05 NOTE — Telephone Encounter (Signed)
Spoke to pt. Told her she has a PCP appointment with Dr. Ronney Asters at [facility] on 09/19/2012 @ 1:30 to arrive at 1:00pm. They are sending her a packet in the mail to bring with her.  Pt stated understanding.

## 2012-09-05 NOTE — Telephone Encounter (Signed)
Jo-Anne at [facility] called to let me know pt has an appointment with Dr. Ronney Asters on 09/19/2012 @ 1:30pm. I called the pt to let her know.

## 2012-09-06 NOTE — Telephone Encounter (Signed)
FYI

## 2012-09-08 ENCOUNTER — Other Ambulatory Visit: Payer: Self-pay | Admitting: Critical Care Medicine

## 2012-09-10 ENCOUNTER — Other Ambulatory Visit: Payer: Medicare Other

## 2012-09-10 ENCOUNTER — Telehealth: Payer: Self-pay | Admitting: Gastroenterology

## 2012-09-10 NOTE — Telephone Encounter (Signed)
Spoke to pt. Told her she will be seeing dr. Derrel Nip on 09/26/2012 @ 1:30 to arrive at 1:15.  She said that was perfect and thanked me.

## 2012-09-16 ENCOUNTER — Ambulatory Visit (INDEPENDENT_AMBULATORY_CARE_PROVIDER_SITE_OTHER): Payer: Medicare Other | Admitting: Family Medicine

## 2012-09-16 ENCOUNTER — Encounter: Payer: Self-pay | Admitting: Family Medicine

## 2012-09-16 VITALS — BP 138/84 | HR 85 | Temp 97.6°F | Ht 62.0 in | Wt 127.0 lb

## 2012-09-16 DIAGNOSIS — M62838 Other muscle spasm: Secondary | ICD-10-CM

## 2012-09-16 DIAGNOSIS — R159 Full incontinence of feces: Secondary | ICD-10-CM

## 2012-09-16 DIAGNOSIS — IMO0002 Reserved for concepts with insufficient information to code with codable children: Secondary | ICD-10-CM

## 2012-09-16 DIAGNOSIS — R5381 Other malaise: Secondary | ICD-10-CM

## 2012-09-16 DIAGNOSIS — K3184 Gastroparesis: Secondary | ICD-10-CM

## 2012-09-16 DIAGNOSIS — G6181 Chronic inflammatory demyelinating polyneuritis: Secondary | ICD-10-CM

## 2012-09-16 DIAGNOSIS — R5383 Other fatigue: Secondary | ICD-10-CM

## 2012-09-16 DIAGNOSIS — C4490 Unspecified malignant neoplasm of skin, unspecified: Secondary | ICD-10-CM

## 2012-09-16 DIAGNOSIS — N8111 Cystocele, midline: Secondary | ICD-10-CM

## 2012-09-16 HISTORY — DX: Reserved for concepts with insufficient information to code with codable children: IMO0002

## 2012-09-16 MED ORDER — AMPHETAMINE-DEXTROAMPHETAMINE 10 MG PO TABS: 10.0000 mg | ORAL_TABLET | Freq: Four times a day (QID) | ORAL | Status: DC

## 2012-09-16 NOTE — Progress Notes (Signed)
Patient ID: [name], female   DOB: 1933-05-19, 79 y.o.   MRN: [id number] FAVEN WATTERSON [id number] [birth year] 09/16/2012      Progress Note-Follow Up  Subjective  Chief Complaint  Chief Complaint  Patient presents with  . Follow-up    6 week    HPI  Is a 79 year old Caucasian female who is in today for followup of multiple medical problems. She continues to follow with numerous specialists. She's Dr. Thad Ranger for chronic pain management. Does note he continued to change her muscle relaxants until they found what was helpful. She reports anaphylaxis helping her leg pain and stiffness. She continues to follow with dermatology, Dr. Katrinka Blazing for various skin concerns no new or acute concerns today. Follows also with Dr. Sharla Kidney of gastroenterology and has been referred to GYN as well as to [facility] for second opinion on her gastroenterology symptoms. She continues to struggle with chronic pain and fatigue. Her Reglan was stopped due to increased fecal incontinence and she doesn't believe that has been somewhat helpful. Her allergies are flared despite numerous medications. She has a lot of nasal congestion as well some headache and mild cough.  Past Medical History  Diagnosis Date  . Anemia   . Anxiety     patient denied  . Depression     patient denied  . Asthma   . Chronic fatigue syndrome   . COPD (chronic obstructive pulmonary disease)   . GERD (gastroesophageal reflux disease)   . Hyperlipidemia   . Hypertension   . Colon polyp   . Allergic rhinitis   . Vitamin D deficiency   . Pulmonary embolism   . Pneumonia   . Neuropathy   . Arthritis   . 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 Blood transfusion   . Osteoporosis   . Thyroid disease   . Constipation   . Weakness   . Family history of anesthesia complication     Daughter with history of malignant hyperthermia  . Malignant hyperthermia     daughter   . Seizures     1990 last seizures on meds  Phenobarb  . Skin cancer     squamous cell  . Neurogenic bladder 2013    husband caths pt  . Thyroid disease 08/08/2012  . Sinusitis 08/08/2012  . Shingles late 3's  . Cystocele 09/16/2012    Past Surgical History  Procedure Laterality Date  . Tonsillectomy and adenoidectomy    . Nasal septum surgery    . Dilation and curettage of uterus    . Tubal ligation    . Cesarean section    . Sinus surgeries      x 4  . Left ovary and tube removed    . Vocal polyps removed    . Appendectomy    . Cystocele repair    . Carpal tunnel release      right  . Shoulder arthroscopy      x2 left, 1 right  . Left finger fusion    . Right median nerve decompression    . Duptyren's contracture right hand    . Finger ganglion cyst excision      right  . Abdominal hysterectomy    . Joint replacement      right and left basal joints of thumbs  . Bladder suspension    . Cataract extraction      bilateral  . Cervical neck ablation      x 7, C3-C4  . Squamous lesions  removed      neck and face  . Basal cell carcinoma excision      face  . Panniculectomy    . Nasal polyp surgery      4    Family History  Problem Relation Age of Onset  . Heart disease Mother   . Hyperlipidemia Mother   . Allergies Sister   . Cancer Sister     ovarian  . Ovarian cancer Sister   . Cancer Sister     lung - stage 1  . Thyroid cancer Sister   . Cancer Sister     thyroid - stage 1  . Lung cancer Sister   . Colon cancer Father   . Cancer Father     colon  . Stomach cancer      first cousin  . Hyperlipidemia Maternal Aunt   . Cancer Cousin     stomach    History   Social History  . Marital Status: Married    Spouse Name: N/A    Number of Children: 3  . Years of Education: N/A   Occupational History  . retired    Social History Main Topics  . Smoking status: Former Smoker -- 0.50 packs/day for 3 years    Types: Cigarettes    Quit date: 05/29/1978  . Smokeless tobacco: Never Used  .  Alcohol Use: No  . Drug Use: No  . Sexually Active: Yes   Other Topics Concern  . Not on file   Social History Narrative      Pt lives at home with her spouse.   Caffeine Use- 2-3 cups of coffee weekly    Current Outpatient Prescriptions on File Prior to Visit  Medication Sig Dispense Refill  . amLODipine (NORVASC) 10 MG tablet Take 1 tablet (10 mg total) by mouth every morning.  30 tablet  5  . BROVANA 15 MCG/2ML NEBU USE TWO MLS  IN NEBULIZER TWICE DAILY  120 mL  6  . budesonide (PULMICORT) 0.25 MG/2ML nebulizer solution Take 2 mLs (0.25 mg total) by nebulization 2 (two) times daily.  60 mL  3  . CALCIUM-VITAMIN D PO Take 1 tablet by mouth daily.       . Camphor-Menthol (SARNA EX) Apply topically as needed.      . cetirizine (ZYRTEC) 10 MG tablet Take 10 mg by mouth daily.        Marland Kitchen denosumab (PROLIA) 60 MG/ML SOLN injection Inject 60 mg into the skin every 6 (six) months. Administer in upper arm, thigh, or abdomen      . dexlansoprazole (DEXILANT) 60 MG capsule Take 1 capsule (60 mg total) by mouth daily.  30 capsule  12  . diclofenac sodium (VOLTAREN) 1 % GEL Apply 1 application topically 4 (four) times daily as needed. Applies to fingers and hands for gout.      Marland Kitchen HYDROcodone-homatropine (HYCODAN) 5-1.5 MG/5ML syrup Take 5 mLs by mouth as needed. For cough.      . levothyroxine (SYNTHROID, LEVOTHROID) 25 MCG tablet Take 1 tablet (25 mcg total) by mouth daily.  30 tablet  2  . lidocaine (LIDODERM) 5 % Place 2 patches onto the skin every morning. Remove & Discard patch within 12 hours or as directed by MD. Applies to back and neck.  60 patch  6  . mometasone (NASONEX) 50 MCG/ACT nasal spray Place 2 sprays into the nose daily.  17 g  2  . montelukast (SINGULAIR) 10 MG tablet Take 10 mg  by mouth at bedtime.      . mupirocin ointment (BACTROBAN) 2 % Apply 1 application topically 2 (two) times daily.      Marland Kitchen oxyCODONE-acetaminophen (PERCOCET) 10-325 MG per tablet Take 1 tablet by mouth  every 4 (four) hours as needed for pain. For breakthrough pain. Stated she hasn't had to use since starting on Oxycontin 12h  60 tablet  0  . oxymorphone (OPANA ER) 20 MG 12 hr tablet Take 20 mg by mouth as needed.       Marland Kitchen PHENobarbital (LUMINAL) 32.4 MG tablet Take 1-2 tablets (32.4-64.8 mg total) by mouth 4 (four) times daily -  with meals and at bedtime. She takes one tablet at 6 am, one tablet at noon and two tabletss at 8 pm.  120 tablet  5  . polyethylene glycol (MIRALAX / GLYCOLAX) packet Take 17 g by mouth at bedtime.  14 each    . predniSONE (DELTASONE) 20 MG tablet Take 20 mg by mouth daily.      . simvastatin (ZOCOR) 40 MG tablet Take 1 tablet (40 mg total) by mouth at bedtime.  30 tablet  3  . Vitamin D, Ergocalciferol, (DRISDOL) 50000 UNITS CAPS TAKE 1 CAPSULE EVERY WEEK  5 capsule  5   No current facility-administered medications on file prior to visit.    Allergies  Allergen Reactions  . Iodine Other (See Comments)    Cardiac arrest  . Morphine And Related Other (See Comments)    Cardiac arrest    Review of Systems  Review of Systems  Constitutional: Positive for malaise/fatigue. Negative for fever.  HENT: Negative for congestion.   Eyes: Negative for discharge.  Respiratory: Negative for shortness of breath.   Cardiovascular: Negative for chest pain, palpitations and leg swelling.  Gastrointestinal: Positive for diarrhea. Negative for nausea, abdominal pain and blood in stool.  Genitourinary: Negative for dysuria.  Musculoskeletal: Positive for back pain and joint pain. Negative for falls.  Skin: Negative for rash.  Neurological: Negative for loss of consciousness and headaches.  Endo/Heme/Allergies: Negative for polydipsia.  Psychiatric/Behavioral: Negative for depression and suicidal ideas. The patient is not nervous/anxious and does not have insomnia.     Objective  BP 138/84  Pulse 85  Temp(Src) 97.6 F (36.4 C) (Oral)  Ht 5\' 2"  (1.575 m)  Wt 127 lb  (57.607 kg)  BMI 23.22 kg/m2  SpO2 97%  Physical Exam  Physical Exam  Constitutional: She is oriented to person, place, and time and well-developed, well-nourished, and in no distress. No distress.  HENT:  Head: Normocephalic and atraumatic.  Eyes: Conjunctivae are normal.  Neck: Neck supple. No thyromegaly present.  Cardiovascular: Normal rate, regular rhythm and normal heart sounds.   Pulmonary/Chest: Effort normal and breath sounds normal. She has no wheezes.  Abdominal: She exhibits no distension and no mass.  Musculoskeletal: She exhibits no edema.  Lymphadenopathy:    She has no cervical adenopathy.  Neurological: She is alert and oriented to person, place, and time.  Skin: Skin is warm and dry. No rash noted. She is not diaphoretic.  Psychiatric: Memory, affect and judgment normal.    Lab Results  Component Value Date   TSH 0.872 08/20/2012   Lab Results  Component Value Date   WBC 4.3 08/20/2012   HGB 14.2 08/20/2012   HCT 40.8 08/20/2012   MCV 88.7 08/20/2012   PLT 241 08/20/2012   Lab Results  Component Value Date   CREATININE 0.60 08/20/2012   BUN 18  08/20/2012   NA 139 08/20/2012   K 4.3 08/20/2012   CL 106 08/20/2012   CO2 24 08/20/2012   Lab Results  Component Value Date   ALT 14 08/20/2012   AST 24 08/20/2012   ALKPHOS 122* 08/20/2012   BILITOT 0.2* 08/20/2012   Lab Results  Component Value Date   CHOL 157 01/05/2012   Lab Results  Component Value Date   HDL 45 01/05/2012   Lab Results  Component Value Date   LDLCALC 95 01/05/2012   Lab Results  Component Value Date   TRIG 87 01/05/2012   Lab Results  Component Value Date   CHOLHDL 3.5 01/05/2012     Assessment & Plan  CIDP (chronic inflammatory demyelinating polyneuropathy) Awaiting second opinion at [facility] in June but is continuing with local neurologist til then.   Fecal incontinence Reglan stopped due to fecal incontinence worsening.  Cystocele Has had 3 previous surgeries and is having  recurrent symptoms, has appt with GYN next week to discuss options  Fatigue Is given 2 months worth of refills on Adderal  Gastroparesis Follows closely with gastroenterology and continues to struggle, they have referred to GYN for recurrent urinary symptoms and cystocele as well  Skin cancer Follows with Dr Katrinka Blazing has an appt soon

## 2012-09-16 NOTE — Assessment & Plan Note (Signed)
Follows closely with gastroenterology and continues to struggle, they have referred to GYN for recurrent urinary symptoms and cystocele as well

## 2012-09-16 NOTE — Assessment & Plan Note (Signed)
Has had 3 previous surgeries and is having recurrent symptoms, has appt with GYN next week to discuss options

## 2012-09-16 NOTE — Assessment & Plan Note (Signed)
Reglan stopped due to fecal incontinence worsening.

## 2012-09-16 NOTE — Assessment & Plan Note (Signed)
Is given 2 months worth of refills on Adderal

## 2012-09-16 NOTE — Assessment & Plan Note (Signed)
>>  ASSESSMENT AND PLAN FOR GASTROPARESIS WRITTEN ON 09/16/2012  9:26 PM BY Neda Balk, MD  Follows closely with gastroenterology and continues to struggle, they have referred to GYN for recurrent urinary symptoms and cystocele as well

## 2012-09-16 NOTE — Assessment & Plan Note (Signed)
Awaiting second opinion at [facility] in June but is continuing with local neurologist til then.

## 2012-09-16 NOTE — Assessment & Plan Note (Signed)
Follows with Dr Katrinka Blazing has an appt soon

## 2012-09-17 ENCOUNTER — Telehealth: Payer: Self-pay | Admitting: Family Medicine

## 2012-09-17 MED ORDER — LEVOTHYROXINE SODIUM 25 MCG PO TABS: 25.0000 ug | ORAL_TABLET | Freq: Every day | ORAL | Status: DC

## 2012-09-17 NOTE — Telephone Encounter (Signed)
Refill- levothyroxine tablet. Take one tablet by mouth daily. Qty 30 last fill 3.31.14

## 2012-09-19 ENCOUNTER — Encounter: Payer: Self-pay | Admitting: Family Medicine

## 2012-09-20 NOTE — Telephone Encounter (Signed)
FYI

## 2012-09-25 ENCOUNTER — Ambulatory Visit (HOSPITAL_BASED_OUTPATIENT_CLINIC_OR_DEPARTMENT_OTHER): Payer: Medicare Other | Admitting: Medical

## 2012-09-25 ENCOUNTER — Ambulatory Visit (HOSPITAL_BASED_OUTPATIENT_CLINIC_OR_DEPARTMENT_OTHER): Payer: Medicare Other | Admitting: Lab

## 2012-09-25 DIAGNOSIS — G40909 Epilepsy, unspecified, not intractable, without status epilepticus: Secondary | ICD-10-CM

## 2012-09-25 DIAGNOSIS — G6181 Chronic inflammatory demyelinating polyneuritis: Secondary | ICD-10-CM

## 2012-09-25 DIAGNOSIS — D509 Iron deficiency anemia, unspecified: Secondary | ICD-10-CM

## 2012-09-25 DIAGNOSIS — D649 Anemia, unspecified: Secondary | ICD-10-CM

## 2012-09-25 LAB — IRON AND TIBC
%SAT: 21 % (ref 20–55)
Iron: 63 ug/dL (ref 42–145)
TIBC: 296 ug/dL (ref 250–470)
UIBC: 233 ug/dL (ref 125–400)

## 2012-09-25 LAB — CBC WITH DIFFERENTIAL (CANCER CENTER ONLY)
BASO#: 0.1 10*3/uL (ref 0.0–0.2)
BASO%: 0.9 % (ref 0.0–2.0)
EOS%: 7.6 % — ABNORMAL HIGH (ref 0.0–7.0)
Eosinophils Absolute: 0.4 10*3/uL (ref 0.0–0.5)
HCT: 42.7 % (ref 34.8–46.6)
HGB: 14.2 g/dL (ref 11.6–15.9)
LYMPH#: 2.5 10*3/uL (ref 0.9–3.3)
LYMPH%: 43.3 % (ref 14.0–48.0)
MCH: 30.3 pg (ref 26.0–34.0)
MCHC: 33.3 g/dL (ref 32.0–36.0)
MCV: 91 fL (ref 81–101)
MONO#: 0.5 10*3/uL (ref 0.1–0.9)
MONO%: 8.1 % (ref 0.0–13.0)
NEUT#: 2.3 10*3/uL (ref 1.5–6.5)
NEUT%: 40.1 % (ref 39.6–80.0)
Platelets: 232 10*3/uL (ref 145–400)
RBC: 4.69 10*6/uL (ref 3.70–5.32)
RDW: 13.1 % (ref 11.1–15.7)
WBC: 5.7 10*3/uL (ref 3.9–10.0)

## 2012-09-25 LAB — RETICULOCYTES (CHCC)
ABS Retic: 33.3 10*3/uL (ref 19.0–186.0)
RBC.: 4.76 MIL/uL (ref 3.87–5.11)
Retic Ct Pct: 0.7 % (ref 0.4–2.3)

## 2012-09-25 LAB — FERRITIN: Ferritin: 81 ng/mL (ref 10–291)

## 2012-09-25 NOTE — Progress Notes (Signed)
DIAGNOSES: 1. Iron deficiency anemia. 2. Chronic inflammatory demyelinating polyneuropathy. 3. Epilepsy.  CURRENT THERAPY:  IV iron as indicated.  INTERIM HISTORY: [name] presents today for an office followup visit.  She, reports, that she's having some issues with her chronic inflammatory demyelinating polyneuropathy.  She is going to see a neurologist down at [facility].  In June.  She does followup with a local neurologist.  She states, now, that she has bowel, and bladder incontinence.  She does have to be catheterized every 3 hours.  She is following up with a gynecologist regarding this issue tomorrow.  In terms of her anemia, her blood count today, looks excellent.  The last iron panel.  Back in February, revealed an iron of 103, with 32% saturation and a ferritin of 183.  The last, time, she received, IV iron was last October.  She otherwise reports, a decent, appetite.  She denies any nausea, vomiting, diarrhea, constipation, any chest pain, shortness of breath, or cough.  She denies any fevers, chills, or night sweats, any abdominal pain, any obvious, or abnormal bleeding.  She denies any lower leg swelling.  She denies any headaches, visual changes, or rashes.  Review of Systems: Constitutional:Negative for malaise/fatigue, fever, chills, weight loss, diaphoresis, activity change, appetite change, and unexpected weight change.  HEENT: Negative for double vision, blurred vision, visual loss, ear pain, tinnitus, congestion, rhinorrhea, epistaxis sore throat or sinus disease, oral pain/lesion, tongue soreness Respiratory: Negative for cough, chest tightness, shortness of breath, wheezing and stridor.  Cardiovascular: Negative for chest pain, palpitations, leg swelling, orthopnea, PND, DOE or claudication Gastrointestinal: Negative for nausea, vomiting, abdominal pain, diarrhea, constipation, blood in stool, melena, hematochezia, abdominal distention, anal bleeding, rectal pain, anorexia and  hematemesis.  Genitourinary: Negative for dysuria, frequency, hematuria,  Musculoskeletal: Negative for myalgias, back pain, joint swelling, arthralgias and gait problem.  Skin: Negative for rash, color change, pallor and wound.  Neurological:. Negative for dizziness/light-headedness, tremors, seizures, syncope, facial asymmetry, speech difficulty, weakness, numbness, headaches and paresthesias.  Hematological: Negative for adenopathy. Does not bruise/bleed easily.  Psychiatric/Behavioral:  Negative for depression, no loss of interest in normal activity or change in sleep pattern.   Physical Exam: This is a 79 year old, well-developed, well-nourished, white female, in no obvious distress Vitals: Temperature 98.3 degrees, pulse 82, respirations 16, blood pressure 156/67, weight 126 pounds HEENT reveals a normocephalic, atraumatic skull, no scleral icterus, no oral lesions  Neck is supple without any cervical or supraclavicular adenopathy.  Lungs are clear to auscultation bilaterally. There are no wheezes, rales or rhonci Cardiac is regular rate and rhythm with a normal S1 and S2. There are no murmurs, rubs, or bruits.  Abdomen is soft with good bowel sounds, there is no palpable mass. There is no palpable hepatosplenomegaly. There is no palpable fluid wave.  Musculoskeletal no tenderness of the spine, ribs, or hips.  Extremities there are no clubbing, cyanosis, or edema.  Skin no petechia, purpura or ecchymosis Neurologic is nonfocal.  Laboratory Data: White count 5.7, hemoglobin 14.2, hematocrit 42.7, MCV 91, platelets 232,000  Current Outpatient Prescriptions on File Prior to Visit  Medication Sig Dispense Refill  . amLODipine (NORVASC) 10 MG tablet Take 1 tablet (10 mg total) by mouth every morning.  30 tablet  5  . amphetamine-dextroamphetamine (ADDERALL) 10 MG tablet Take 1 tablet (10 mg total) by mouth 4 (four) times daily. May  120 tablet  0  . atorvastatin (LIPITOR) 40 MG tablet  Take 40 mg by mouth daily.      Marland Kitchen  BROVANA 15 MCG/2ML NEBU USE TWO MLS  IN NEBULIZER TWICE DAILY  120 mL  6  . budesonide (PULMICORT) 0.25 MG/2ML nebulizer solution Take 2 mLs (0.25 mg total) by nebulization 2 (two) times daily.  60 mL  3  . CALCIUM-VITAMIN D PO Take 1 tablet by mouth daily.       . Camphor-Menthol (SARNA EX) Apply topically as needed.      . cetirizine (ZYRTEC) 10 MG tablet Take 10 mg by mouth daily.        Marland Kitchen denosumab (PROLIA) 60 MG/ML SOLN injection Inject 60 mg into the skin every 6 (six) months. Administer in upper arm, thigh, or abdomen      . dexlansoprazole (DEXILANT) 60 MG capsule Take 1 capsule (60 mg total) by mouth daily.  30 capsule  12  . diclofenac sodium (VOLTAREN) 1 % GEL Apply 1 application topically 4 (four) times daily as needed. Applies to fingers and hands for gout.      Marland Kitchen HYDROcodone-homatropine (HYCODAN) 5-1.5 MG/5ML syrup Take 5 mLs by mouth as needed. For cough.      . levothyroxine (SYNTHROID, LEVOTHROID) 25 MCG tablet Take 1 tablet (25 mcg total) by mouth daily.  30 tablet  2  . lidocaine (LIDODERM) 5 % Place 2 patches onto the skin every morning. Remove & Discard patch within 12 hours or as directed by MD. Applies to back and neck.  60 patch  6  . mometasone (NASONEX) 50 MCG/ACT nasal spray Place 2 sprays into the nose daily.  17 g  2  . montelukast (SINGULAIR) 10 MG tablet Take 10 mg by mouth at bedtime.      . ondansetron (ZOFRAN-ODT) 4 MG disintegrating tablet Take 4 mg by mouth 3 (three) times daily.      Marland Kitchen oxyCODONE-acetaminophen (PERCOCET) 10-325 MG per tablet Take 1 tablet by mouth every 4 (four) hours as needed for pain. For breakthrough pain. Stated she hasn't had to use since starting on Oxycontin 12h  60 tablet  0  . oxymorphone (OPANA ER) 20 MG 12 hr tablet Take 20 mg by mouth as needed.       Marland Kitchen PHENobarbital (LUMINAL) 32.4 MG tablet Take 1-2 tablets (32.4-64.8 mg total) by mouth 4 (four) times daily -  with meals and at bedtime. She takes  one tablet at 6 am, one tablet at noon and two tabletss at 8 pm.  120 tablet  5  . polyethylene glycol (MIRALAX / GLYCOLAX) packet Take 17 g by mouth at bedtime.  14 each    . predniSONE (DELTASONE) 20 MG tablet Take 20 mg by mouth daily.      . theophylline (THEODUR) 200 MG 12 hr tablet Take 100 mg by mouth 2 (two) times daily. Two by mouth twice a day      . tiZANidine (ZANAFLEX) 4 MG capsule Take 1 capsule (4 mg total) by mouth 2 (two) times daily.      . Vitamin D, Ergocalciferol, (DRISDOL) 50000 UNITS CAPS TAKE 1 CAPSULE EVERY WEEK  5 capsule  5   No current facility-administered medications on file prior to visit.   Assessment/Plan: This is a pleasant, 79 year old, white female, with the following issues:  #1.  Intermittent iron deficiency anemia.  I would be surprised if her iron levels are low, however, we are checking an iron panel on her.  #2.  Chronic, inflammatory, demyelinating polyneuropathy.  She follows up with a local neurologist.  She will also be seen by neurologist down  at [facility] in June.  #3.  Followup.  We will follow back up with Ms. Crotwell in 2 months, but before then should there be questions or concerns.

## 2012-10-02 ENCOUNTER — Encounter: Payer: Self-pay | Admitting: *Deleted

## 2012-10-08 ENCOUNTER — Telehealth: Payer: Self-pay | Admitting: Internal Medicine

## 2012-10-08 DIAGNOSIS — R110 Nausea: Secondary | ICD-10-CM

## 2012-10-08 MED ORDER — ONDANSETRON 4 MG PO TBDP: 4.0000 mg | ORAL_TABLET | Freq: Three times a day (TID) | ORAL | Status: DC

## 2012-10-08 NOTE — Telephone Encounter (Signed)
Never received a medication refill request for pt's medication. Sent in a refill with 1 extra refill to [facility]. Pharmacy to call pt when ready.

## 2012-10-10 ENCOUNTER — Telehealth: Payer: Self-pay | Admitting: Family Medicine

## 2012-10-10 MED ORDER — VITAMIN D (ERGOCALCIFEROL) 1.25 MG (50000 UNIT) PO CAPS: ORAL_CAPSULE | ORAL | Status: DC

## 2012-10-10 NOTE — Telephone Encounter (Signed)
Refill- vitamin D 50000IU cap. Take one capsule by mouth every week. Qty 4 last fill 5.15.14

## 2012-10-10 NOTE — Telephone Encounter (Signed)
Rx sent in to pharmacy. 

## 2012-10-31 ENCOUNTER — Other Ambulatory Visit: Payer: Self-pay | Admitting: Gastroenterology

## 2012-10-31 DIAGNOSIS — R110 Nausea: Secondary | ICD-10-CM

## 2012-10-31 MED ORDER — ONDANSETRON 4 MG PO TBDP: 4.0000 mg | ORAL_TABLET | Freq: Three times a day (TID) | ORAL | Status: DC

## 2012-11-04 ENCOUNTER — Telehealth: Payer: Self-pay | Admitting: Family Medicine

## 2012-11-04 NOTE — Telephone Encounter (Signed)
Refill- vitamin d 50000IU cap. Take one capsule by mouth every week. Qty 5 last fill 5.15.14

## 2012-11-04 NOTE — Telephone Encounter (Signed)
RX was sent on 10-10-12 with refills

## 2012-11-21 DIAGNOSIS — M4802 Spinal stenosis, cervical region: Secondary | ICD-10-CM | POA: Insufficient documentation

## 2012-11-28 ENCOUNTER — Other Ambulatory Visit (HOSPITAL_BASED_OUTPATIENT_CLINIC_OR_DEPARTMENT_OTHER): Payer: Medicare Other | Admitting: Lab

## 2012-11-28 ENCOUNTER — Ambulatory Visit (HOSPITAL_BASED_OUTPATIENT_CLINIC_OR_DEPARTMENT_OTHER): Payer: Medicare Other | Admitting: Hematology & Oncology

## 2012-11-28 VITALS — BP 129/76 | HR 104 | Temp 98.0°F | Resp 16 | Ht 62.0 in | Wt 128.0 lb

## 2012-11-28 DIAGNOSIS — D649 Anemia, unspecified: Secondary | ICD-10-CM

## 2012-11-28 DIAGNOSIS — D509 Iron deficiency anemia, unspecified: Secondary | ICD-10-CM | POA: Diagnosis not present

## 2012-11-28 LAB — CBC WITH DIFFERENTIAL (CANCER CENTER ONLY)
BASO#: 0.1 10*3/uL (ref 0.0–0.2)
BASO%: 0.7 % (ref 0.0–2.0)
EOS%: 9.8 % — ABNORMAL HIGH (ref 0.0–7.0)
Eosinophils Absolute: 0.7 10*3/uL — ABNORMAL HIGH (ref 0.0–0.5)
HCT: 42.7 % (ref 34.8–46.6)
HGB: 14.3 g/dL (ref 11.6–15.9)
LYMPH#: 2.9 10*3/uL (ref 0.9–3.3)
LYMPH%: 42.3 % (ref 14.0–48.0)
MCH: 30 pg (ref 26.0–34.0)
MCHC: 33.5 g/dL (ref 32.0–36.0)
MCV: 90 fL (ref 81–101)
MONO#: 0.5 10*3/uL (ref 0.1–0.9)
MONO%: 8 % (ref 0.0–13.0)
NEUT#: 2.6 10*3/uL (ref 1.5–6.5)
NEUT%: 39.2 % — ABNORMAL LOW (ref 39.6–80.0)
Platelets: 254 10*3/uL (ref 145–400)
RBC: 4.77 10*6/uL (ref 3.70–5.32)
RDW: 13.2 % (ref 11.1–15.7)
WBC: 6.7 10*3/uL (ref 3.9–10.0)

## 2012-11-28 NOTE — Progress Notes (Signed)
This office note has been dictated.

## 2012-12-02 LAB — IRON AND TIBC CHCC
%SAT: 24 % (ref 21–57)
Iron: 66 ug/dL (ref 41–142)
TIBC: 271 ug/dL (ref 236–444)
UIBC: 205 ug/dL (ref 120–384)

## 2012-12-02 LAB — FERRITIN CHCC: Ferritin: 138 ng/ml (ref 9–269)

## 2012-12-02 NOTE — Progress Notes (Signed)
CC:   Alysia Penna, MD, Fax [phone]  DIAGNOSES: 1. Iron-deficiency anemia. 2. Peripheral neuropathy, etiology unknown.  CURRENT THERAPY:  IV iron as indicated.  INTERIM HISTORY:  [name] comes in for followup.  She was seen out at [facility].  She does not have IDP.  They feel that she has a peripheral neuropathy.  They are working her up for this.  Of note, we gave her iron way back in October of last year.  She has been doing pretty well with this.  She sees a new doctor at [facility].  She apparently was found to have some elevated LFTs.  She had an ultrasound that was done, which she reports as being normal.  She, again, has been seen out at [facility].  When we last checked her iron studies back in April, her iron saturation was 21%.  Ferritin was 81.  This may be an indicator of a decrease in iron stores.  She is focusing on this peripheral neuropathy.  I am not sure exactly how this will "pan out."  She did have monoclonal studies done back in September 2013.  There was no monoclonal spike noted in her serum.  PHYSICAL EXAMINATION:  General:  This is an elderly white female in no obvious distress.  Vital signs:  Temperature of 98, pulse 104, respiratory rate 16, blood pressure 129/76.  Weight is 128.  Head and neck exam:  Normocephalic, atraumatic skull.  There are no ocular or oral lesions.  There are no palpable cervical or supraclavicular lymph nodes.  Lungs:  Clear bilaterally.  Cardiac exam:  Regular rate and rhythm with a normal S1, S2.  There are no murmurs, rubs or bruits. Abdominal exam:  Soft with good bowel sounds.  There is no palpable abdominal mass.  There is no fluid wave.  There is no palpable hepatosplenomegaly.  Extremities:  No clubbing, cyanosis or edema.  She has some osteoarthritic changes in her joints.  Skin exam:  No rashes, ecchymosis, or petechia.  Neurological exam:  No focal neurological deficits.  LABORATORY STUDIES:  White  cell count is 6.7, hemoglobin 14.3, hematocrit 42.7, platelet count 254.  MCV is 90.  IMPRESSION:  [name] is a very charming, 79 year old white female. She is on multiple medications.  She has a lot of health issues.  From my point of view, her iron deficiency is not an active issue right now. Again, we are following her iron stores, and I suppose that she likely will need iron at some point in the future.  For now, I think we could probably get her back after Labor Day.  Hopefully, she will have a good report for this peripheral neuropathy workup.    ______________________________ Josph Macho, M.D. PRE/MEDQ  D:  11/28/2012  T:  11/29/2012  Job:  [id number]

## 2012-12-06 ENCOUNTER — Encounter (HOSPITAL_BASED_OUTPATIENT_CLINIC_OR_DEPARTMENT_OTHER): Payer: Self-pay | Admitting: *Deleted

## 2012-12-06 ENCOUNTER — Emergency Department (HOSPITAL_BASED_OUTPATIENT_CLINIC_OR_DEPARTMENT_OTHER)
Admission: EM | Admit: 2012-12-06 | Discharge: 2012-12-06 | Disposition: A | Discharge status: Home / Self Care | Payer: Medicare Other | Attending: Emergency Medicine | Admitting: Emergency Medicine

## 2012-12-06 DIAGNOSIS — E079 Disorder of thyroid, unspecified: Secondary | ICD-10-CM | POA: Insufficient documentation

## 2012-12-06 DIAGNOSIS — R1013 Epigastric pain: Secondary | ICD-10-CM | POA: Insufficient documentation

## 2012-12-06 DIAGNOSIS — R110 Nausea: Secondary | ICD-10-CM | POA: Insufficient documentation

## 2012-12-06 DIAGNOSIS — Z860100 Personal history of colon polyps, unspecified: Secondary | ICD-10-CM | POA: Insufficient documentation

## 2012-12-06 DIAGNOSIS — Z87448 Personal history of other diseases of urinary system: Secondary | ICD-10-CM | POA: Insufficient documentation

## 2012-12-06 DIAGNOSIS — Z9089 Acquired absence of other organs: Secondary | ICD-10-CM | POA: Insufficient documentation

## 2012-12-06 DIAGNOSIS — K219 Gastro-esophageal reflux disease without esophagitis: Secondary | ICD-10-CM | POA: Insufficient documentation

## 2012-12-06 DIAGNOSIS — G8929 Other chronic pain: Secondary | ICD-10-CM | POA: Insufficient documentation

## 2012-12-06 DIAGNOSIS — Z87891 Personal history of nicotine dependence: Secondary | ICD-10-CM | POA: Insufficient documentation

## 2012-12-06 DIAGNOSIS — Z79899 Other long term (current) drug therapy: Secondary | ICD-10-CM | POA: Insufficient documentation

## 2012-12-06 DIAGNOSIS — M810 Age-related osteoporosis without current pathological fracture: Secondary | ICD-10-CM | POA: Insufficient documentation

## 2012-12-06 DIAGNOSIS — M129 Arthropathy, unspecified: Secondary | ICD-10-CM | POA: Insufficient documentation

## 2012-12-06 DIAGNOSIS — Z85828 Personal history of other malignant neoplasm of skin: Secondary | ICD-10-CM | POA: Insufficient documentation

## 2012-12-06 DIAGNOSIS — G9332 Myalgic encephalomyelitis/chronic fatigue syndrome: Secondary | ICD-10-CM | POA: Insufficient documentation

## 2012-12-06 DIAGNOSIS — Z86711 Personal history of pulmonary embolism: Secondary | ICD-10-CM | POA: Insufficient documentation

## 2012-12-06 DIAGNOSIS — R5382 Chronic fatigue, unspecified: Secondary | ICD-10-CM | POA: Insufficient documentation

## 2012-12-06 DIAGNOSIS — R109 Unspecified abdominal pain: Secondary | ICD-10-CM

## 2012-12-06 DIAGNOSIS — K3184 Gastroparesis: Secondary | ICD-10-CM | POA: Insufficient documentation

## 2012-12-06 DIAGNOSIS — Z8619 Personal history of other infectious and parasitic diseases: Secondary | ICD-10-CM | POA: Insufficient documentation

## 2012-12-06 DIAGNOSIS — Z90710 Acquired absence of both cervix and uterus: Secondary | ICD-10-CM | POA: Insufficient documentation

## 2012-12-06 DIAGNOSIS — Z8719 Personal history of other diseases of the digestive system: Secondary | ICD-10-CM | POA: Insufficient documentation

## 2012-12-06 DIAGNOSIS — IMO0001 Reserved for inherently not codable concepts without codable children: Secondary | ICD-10-CM | POA: Insufficient documentation

## 2012-12-06 DIAGNOSIS — I10 Essential (primary) hypertension: Secondary | ICD-10-CM | POA: Insufficient documentation

## 2012-12-06 DIAGNOSIS — Z8701 Personal history of pneumonia (recurrent): Secondary | ICD-10-CM | POA: Insufficient documentation

## 2012-12-06 DIAGNOSIS — E785 Hyperlipidemia, unspecified: Secondary | ICD-10-CM | POA: Insufficient documentation

## 2012-12-06 DIAGNOSIS — J4489 Other specified chronic obstructive pulmonary disease: Secondary | ICD-10-CM | POA: Insufficient documentation

## 2012-12-06 DIAGNOSIS — Z9889 Other specified postprocedural states: Secondary | ICD-10-CM | POA: Insufficient documentation

## 2012-12-06 DIAGNOSIS — E559 Vitamin D deficiency, unspecified: Secondary | ICD-10-CM | POA: Insufficient documentation

## 2012-12-06 DIAGNOSIS — J449 Chronic obstructive pulmonary disease, unspecified: Secondary | ICD-10-CM | POA: Insufficient documentation

## 2012-12-06 DIAGNOSIS — Z8709 Personal history of other diseases of the respiratory system: Secondary | ICD-10-CM | POA: Insufficient documentation

## 2012-12-06 DIAGNOSIS — Z8742 Personal history of other diseases of the female genital tract: Secondary | ICD-10-CM | POA: Insufficient documentation

## 2012-12-06 DIAGNOSIS — Z8669 Personal history of other diseases of the nervous system and sense organs: Secondary | ICD-10-CM | POA: Insufficient documentation

## 2012-12-06 DIAGNOSIS — Z9851 Tubal ligation status: Secondary | ICD-10-CM | POA: Insufficient documentation

## 2012-12-06 DIAGNOSIS — Z8659 Personal history of other mental and behavioral disorders: Secondary | ICD-10-CM | POA: Insufficient documentation

## 2012-12-06 DIAGNOSIS — IMO0002 Reserved for concepts with insufficient information to code with codable children: Secondary | ICD-10-CM | POA: Insufficient documentation

## 2012-12-06 LAB — COMPREHENSIVE METABOLIC PANEL
ALT: 33 U/L (ref 0–35)
AST: 38 U/L — ABNORMAL HIGH (ref 0–37)
Albumin: 3.8 g/dL (ref 3.5–5.2)
Alkaline Phosphatase: 177 U/L — ABNORMAL HIGH (ref 39–117)
BUN: 16 mg/dL (ref 6–23)
CO2: 26 mEq/L (ref 19–32)
Calcium: 9.4 mg/dL (ref 8.4–10.5)
Chloride: 104 mEq/L (ref 96–112)
Creatinine, Ser: 0.6 mg/dL (ref 0.50–1.10)
GFR calc Af Amer: >90 mL/min (ref >90)
GFR calc non Af Amer: 85 mL/min — ABNORMAL LOW (ref >90)
Glucose, Bld: 97 mg/dL (ref 70–99)
Potassium: 4 mEq/L (ref 3.5–5.1)
Sodium: 139 mEq/L (ref 135–145)
Total Bilirubin: 0.3 mg/dL (ref 0.3–1.2)
Total Protein: 7.2 g/dL (ref 6.0–8.3)

## 2012-12-06 LAB — URINE MICROSCOPIC-ADD ON

## 2012-12-06 LAB — URINALYSIS, ROUTINE W REFLEX MICROSCOPIC
Bilirubin Urine: NEGATIVE
Glucose, UA: NEGATIVE mg/dL
Ketones, ur: 15 mg/dL — AB
Leukocytes, UA: NEGATIVE
Nitrite: POSITIVE — AB
Protein, ur: NEGATIVE mg/dL
Specific Gravity, Urine: 1.017 (ref 1.005–1.030)
Urobilinogen, UA: 0.2 mg/dL (ref 0.0–1.0)
pH: 6.5 (ref 5.0–8.0)

## 2012-12-06 LAB — CBC WITH DIFFERENTIAL/PLATELET
Basophils Absolute: 0 10*3/uL (ref 0.0–0.1)
Basophils Relative: 1 % (ref 0–1)
Eosinophils Absolute: 0.3 10*3/uL (ref 0.0–0.7)
Eosinophils Relative: 4 % (ref 0–5)
HCT: 41.2 % (ref 36.0–46.0)
Hemoglobin: 14 g/dL (ref 12.0–15.0)
Lymphocytes Relative: 25 % (ref 12–46)
Lymphs Abs: 1.9 10*3/uL (ref 0.7–4.0)
MCH: 29.6 pg (ref 26.0–34.0)
MCHC: 34 g/dL (ref 30.0–36.0)
MCV: 87.1 fL (ref 78.0–100.0)
Monocytes Absolute: 0.6 10*3/uL (ref 0.1–1.0)
Monocytes Relative: 7 % (ref 3–12)
Neutro Abs: 4.8 10*3/uL (ref 1.7–7.7)
Neutrophils Relative %: 64 % (ref 43–77)
Platelets: 236 10*3/uL (ref 150–400)
RBC: 4.73 MIL/uL (ref 3.87–5.11)
RDW: 13.1 % (ref 11.5–15.5)
WBC: 7.5 10*3/uL (ref 4.0–10.5)

## 2012-12-06 LAB — LIPASE, BLOOD: Lipase: 12 U/L (ref 11–59)

## 2012-12-06 NOTE — ED Notes (Signed)
Pt points to epigastric area where she reports pain began around 430 pm she took 2 percocet and the pain is "much better than it was before I came here" she also reports  That she took a zofran which she is  Supposed to take 3 times a day due to her gastroparesis she denies any vomiting

## 2012-12-06 NOTE — ED Provider Notes (Signed)
History    CSN: [id number] Arrival date & time 12/06/12  1746  First MD Initiated Contact with Patient 12/06/12 1846     Chief Complaint  Patient presents with  . Abdominal Pain   (Consider location/radiation/quality/duration/timing/severity/associated sxs/prior Treatment) HPI Comments: Patient is a 79 year old female with significant past medical history who presents today with sudden onset of dull pain at 2am this morning. At that time she took an [place] ER and used a heating pain. This improved the pain and she was able to fall back asleep. She woke up at 530-6am this morning and was again in pain. She took 2 percocet and used a heating pad. This helped her pain to the point where she was feeling well enough to go see a movie. During the movie she states she was in agony. Her last dose of percocet was at 430 pm. She states it took approximately 1 hour for the medication to work. She is currently feeling relief to her pain. She has not eaten anything today and has only drank Gatorade. She currently denies nausea. She takes a daily zofran for her gastroparesis. Her last BM was today. It was normal for her. No blood. She is incontinent of her bowels. This has been a chronic problem since April 2013 when she had part of her colon removed. This surgery was an open abdominal surgery. 2 months after her colon surgery she became incontinent of urine. She sees Dr. Sheran Luz at [facility] orthopedics for chronic pain management. She wears neck and back lidocaine patches.      The history is provided by the patient. No language interpreter was used.   Past Medical History  Diagnosis Date  . Anemia   . Anxiety     patient denied  . Depression     patient denied  . Asthma   . Chronic fatigue syndrome   . COPD (chronic obstructive pulmonary disease)   . GERD (gastroesophageal reflux disease)   . Hyperlipidemia   . Hypertension   . Colon polyp   . Allergic rhinitis   . Vitamin D  deficiency   . Pulmonary embolism   . Pneumonia   . Neuropathy   . Arthritis   . 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 Blood transfusion   . Osteoporosis   . Thyroid disease   . Constipation   . Weakness   . Family history of anesthesia complication     Daughter with history of malignant hyperthermia  . Malignant hyperthermia     daughter   . Seizures     1990 last seizures on meds Phenobarb  . Skin cancer     squamous cell  . Neurogenic bladder 2013    husband caths pt  . Thyroid disease 08/08/2012  . Sinusitis 08/08/2012  . Shingles late 69's  . Cystocele 09/16/2012   Past Surgical History  Procedure Laterality Date  . Tonsillectomy and adenoidectomy    . Nasal septum surgery    . Dilation and curettage of uterus    . Tubal ligation    . Cesarean section    . Sinus surgeries      x 4  . Left ovary and tube removed    . Vocal polyps removed    . Appendectomy    . Cystocele repair    . Carpal tunnel release      right  . Shoulder arthroscopy      x2 left, 1 right  . Left finger fusion    .  Right median nerve decompression    . Duptyren's contracture right hand    . Finger ganglion cyst excision      right  . Abdominal hysterectomy    . Joint replacement      right and left basal joints of thumbs  . Bladder suspension    . Cataract extraction      bilateral  . Cervical neck ablation      x 7, C3-C4  . Squamous lesions removed      neck and face  . Basal cell carcinoma excision      face  . Panniculectomy    . Nasal polyp surgery      4   Family History  Problem Relation Age of Onset  . Heart disease Mother   . Hyperlipidemia Mother   . Allergies Sister   . Cancer Sister     ovarian  . Ovarian cancer Sister   . Cancer Sister     lung - stage 1  . Thyroid cancer Sister   . Cancer Sister     thyroid - stage 1  . Lung cancer Sister   . Colon cancer Father   . Cancer Father     colon  . Stomach cancer      first cousin    . Hyperlipidemia Maternal Aunt   . Cancer Cousin     stomach   History  Substance Use Topics  . Smoking status: Former Smoker -- 0.50 packs/day for 3 years    Types: Cigarettes    Quit date: 05/29/1978  . Smokeless tobacco: Never Used  . Alcohol Use: No   OB History   Grav Para Term Preterm Abortions TAB SAB Ect Mult Living                 Review of Systems  Constitutional: Negative for fever and chills.  Respiratory: Negative for shortness of breath.   Cardiovascular: Negative for chest pain.  Gastrointestinal: Positive for nausea and abdominal pain. Negative for vomiting and constipation.  Musculoskeletal: Positive for myalgias and arthralgias.  All other systems reviewed and are negative.    Allergies  Iodine and Morphine and related  Home Medications   Current Outpatient Rx  Name  Route  Sig  Dispense  Refill  . amLODipine (NORVASC) 10 MG tablet   Oral   Take 1 tablet (10 mg total) by mouth every morning.   30 tablet   5   . amphetamine-dextroamphetamine (ADDERALL) 10 MG tablet   Oral   Take 1 tablet (10 mg total) by mouth 4 (four) times daily. May   120 tablet   0   . atorvastatin (LIPITOR) 40 MG tablet   Oral   Take 40 mg by mouth daily.         Marland Kitchen BROVANA 15 MCG/2ML NEBU      USE TWO MLS  IN NEBULIZER TWICE DAILY   120 mL   6     Dx code 496.  File under medicare part b   . budesonide (PULMICORT) 0.25 MG/2ML nebulizer solution   Nebulization   Take 2 mLs (0.25 mg total) by nebulization 2 (two) times daily.   60 mL   3   . CALCIUM-VITAMIN D PO   Oral   Take 1 tablet by mouth daily.          . cetirizine (ZYRTEC) 10 MG tablet   Oral   Take 10 mg by mouth daily.           Marland Kitchen  denosumab (PROLIA) 60 MG/ML SOLN injection   Subcutaneous   Inject 60 mg into the skin every 6 (six) months. Administer in upper arm, thigh, or abdomen         . dexlansoprazole (DEXILANT) 60 MG capsule   Oral   Take 1 capsule (60 mg total) by mouth  daily.   30 capsule   12   . diclofenac sodium (VOLTAREN) 1 % GEL   Topical   Apply 1 application topically 4 (four) times daily as needed. Applies to fingers and hands for gout.         Marland Kitchen HYDROcodone-homatropine (HYCODAN) 5-1.5 MG/5ML syrup   Oral   Take 5 mLs by mouth as needed. For cough.         . levothyroxine (SYNTHROID, LEVOTHROID) 25 MCG tablet   Oral   Take 1 tablet (25 mcg total) by mouth daily.   30 tablet   2   . lidocaine (LIDODERM) 5 %   Transdermal   Place 2 patches onto the skin every morning. Remove & Discard patch within 12 hours or as directed by MD. Applies to back and neck.   60 patch   6   . mometasone (NASONEX) 50 MCG/ACT nasal spray   Nasal   Place 2 sprays into the nose daily.   17 g   2   . montelukast (SINGULAIR) 10 MG tablet   Oral   Take 10 mg by mouth at bedtime.         . ondansetron (ZOFRAN-ODT) 4 MG disintegrating tablet   Oral   Take 1 tablet (4 mg total) by mouth 3 (three) times daily.   30 tablet   1   . oxyCODONE-acetaminophen (PERCOCET) 10-325 MG per tablet   Oral   Take 1 tablet by mouth every 4 (four) hours as needed for pain. For breakthrough pain. Stated she hasn't had to use since starting on Oxycontin 12h   60 tablet   0   . oxymorphone (OPANA ER) 20 MG 12 hr tablet   Oral   Take 20 mg by mouth as needed.          Marland Kitchen PHENobarbital (LUMINAL) 32.4 MG tablet   Oral   Take 1-2 tablets (32.4-64.8 mg total) by mouth 4 (four) times daily -  with meals and at bedtime. She takes one tablet at 6 am, one tablet at noon and two tabletss at 8 pm.   120 tablet   5   . polyethylene glycol (MIRALAX / GLYCOLAX) packet   Oral   Take 17 g by mouth at bedtime.   14 each      . predniSONE (DELTASONE) 20 MG tablet   Oral   Take 20 mg by mouth daily.         . theophylline (THEODUR) 100 MG 12 hr tablet   Oral   Take 100 mg by mouth every morning.         Marland Kitchen tiZANidine (ZANAFLEX) 4 MG capsule   Oral   Take 1  capsule (4 mg total) by mouth 2 (two) times daily.         . Vitamin D, Ergocalciferol, (DRISDOL) 50000 UNITS CAPS      TAKE 1 CAPSULE EVERY WEEK   4 capsule   5    BP 149/79  Pulse 91  Temp(Src) 98.6 F (37 C) (Oral)  Resp 16  Ht 5\' 2"  (1.575 m)  Wt 130 lb (58.968 kg)  BMI 23.77 kg/m2  SpO2  98% Physical Exam  Nursing note and vitals reviewed. Constitutional: She is oriented to person, place, and time. She appears well-developed and well-nourished. No distress.  HENT:  Head: Normocephalic and atraumatic.  Right Ear: External ear normal.  Left Ear: External ear normal.  Nose: Nose normal.  Mouth/Throat: Oropharynx is clear and moist.  Eyes: Conjunctivae are normal.  Neck: Normal range of motion.  Cardiovascular: Normal rate, regular rhythm and normal heart sounds.   Pulmonary/Chest: Effort normal and breath sounds normal. No stridor. No respiratory distress. She has no wheezes. She has no rales.  Abdominal: Soft. Bowel sounds are normal. She exhibits no distension. There is tenderness in the epigastric area. There is no rigidity, no rebound and no guarding.    Well healed incision site scar  Musculoskeletal: Normal range of motion.  Neurological: She is alert and oriented to person, place, and time. She has normal strength.  Skin: Skin is warm and dry. She is not diaphoretic. No erythema.  Psychiatric: She has a normal mood and affect. Her behavior is normal.    ED Course  Procedures (including critical care time) Labs Reviewed  COMPREHENSIVE METABOLIC PANEL - Abnormal; Notable for the following:    AST 38 (*)    Alkaline Phosphatase 177 (*)    GFR calc non Af Amer 85 (*)    All other components within normal limits  URINALYSIS, ROUTINE W REFLEX MICROSCOPIC - Abnormal; Notable for the following:    Hgb urine dipstick TRACE (*)    Ketones, ur 15 (*)    Nitrite POSITIVE (*)    All other components within normal limits  URINE MICROSCOPIC-ADD ON - Abnormal; Notable  for the following:    Bacteria, UA MANY (*)    All other components within normal limits  CBC WITH DIFFERENTIAL  LIPASE, BLOOD   No results found. 1. Abdominal pain     MDM  Patient is nontoxic, nonseptic appearing, in no apparent distress.  Patient's pain and other symptoms adequately managed in emergency department. Labs and vitals reviewed.  Patient does not meet the SIRS or Sepsis criteria.  On repeat exam patient does not have a surgical abdomin and there are nor peritoneal signs.  No indication of appendicitis, bowel obstruction, bowel perforation, cholecystitis, diverticulitis. No hx of afib to make mesenteric ischemia more likely. Patient's pain responds to pain medication and patient refuses all pain meds in ED. Patient discharged home with symptomatic treatment and given strict instructions for follow-up with their primary care physician.  I have also discussed reasons to return immediately to the ER.  Patient expresses understanding and agrees with plan. Dr. Ignacia Palma evaluated this patient and agrees with plan.      Mora Bellman, PA-C 12/09/12 1407

## 2012-12-06 NOTE — ED Notes (Signed)
Pt c/o upper abd pain x 1 day 

## 2012-12-07 NOTE — Progress Notes (Signed)
79 yo woman whom I have seen earlier this year, with recurrent abdominal pain post bowel resceion and neurologic problems with weakness in arms and legs, difficulty walking, that is currently being worked up at [facility].  Exam today shows pain localized to the epigastric region, no mass or point tenderness.  Her lab workup today is negative, do not see reason to scan her.  She can continue her pain medicines as prescribed by Dr. Ethelene Hal, her pain management specialist, and followup with Dr. Jarold Motto, her primary care internist.

## 2012-12-19 NOTE — ED Provider Notes (Signed)
Medical screening examination/treatment/procedure(s) were conducted as a shared visit with non-physician practitioner(s) and myself.  I personally evaluated the patient during the encounter 79 yo woman whom I have seen earlier this year, with recurrent abdominal pain post bowel resceion and neurologic problems with weakness in arms and legs, difficulty walking, that is currently being worked up at [facility]. Exam today shows pain localized to the epigastric region, no mass or point tenderness. Her lab workup today is negative, do not see reason to scan her. She can continue her pain medicines as prescribed by Dr. Ethelene Hal, her pain management specialist, and followup with Dr. Jarold Motto, her primary care internist.       Carleene Cooper III, MD 12/19/12 1143

## 2012-12-23 ENCOUNTER — Encounter: Payer: Self-pay | Admitting: Internal Medicine

## 2012-12-24 ENCOUNTER — Encounter: Payer: Self-pay | Admitting: Internal Medicine

## 2012-12-24 ENCOUNTER — Ambulatory Visit (INDEPENDENT_AMBULATORY_CARE_PROVIDER_SITE_OTHER): Payer: Medicare Other | Admitting: Internal Medicine

## 2012-12-24 ENCOUNTER — Other Ambulatory Visit (INDEPENDENT_AMBULATORY_CARE_PROVIDER_SITE_OTHER): Payer: Medicare Other

## 2012-12-24 VITALS — BP 130/80 | HR 88 | Ht 62.0 in | Wt 126.2 lb

## 2012-12-24 DIAGNOSIS — K5900 Constipation, unspecified: Secondary | ICD-10-CM

## 2012-12-24 DIAGNOSIS — D126 Benign neoplasm of colon, unspecified: Secondary | ICD-10-CM

## 2012-12-24 DIAGNOSIS — K5909 Other constipation: Secondary | ICD-10-CM

## 2012-12-24 DIAGNOSIS — R7989 Other specified abnormal findings of blood chemistry: Secondary | ICD-10-CM

## 2012-12-24 DIAGNOSIS — R110 Nausea: Secondary | ICD-10-CM

## 2012-12-24 DIAGNOSIS — K3184 Gastroparesis: Secondary | ICD-10-CM

## 2012-12-24 LAB — HEPATIC FUNCTION PANEL
ALT: 27 U/L (ref 0–35)
AST: 31 U/L (ref 0–37)
Albumin: 4.2 g/dL (ref 3.5–5.2)
Alkaline Phosphatase: 111 U/L (ref 39–117)
Bilirubin, Direct: 0.1 mg/dL (ref 0.0–0.3)
Total Bilirubin: 0.2 mg/dL — ABNORMAL LOW (ref 0.3–1.2)
Total Protein: 7.5 g/dL (ref 6.0–8.3)

## 2012-12-24 MED ORDER — POLYETHYLENE GLYCOL 3350 17 G PO PACK: 17.0000 g | PACK | Freq: Every day | ORAL | Status: DC

## 2012-12-24 MED ORDER — ONDANSETRON 4 MG PO TBDP: 4.0000 mg | ORAL_TABLET | Freq: Three times a day (TID) | ORAL | Status: DC

## 2012-12-24 NOTE — Progress Notes (Signed)
Subjective:    Patient ID: [name], female    DOB: [birth year], 79 y.o.   MRN: [id number]  HPI [name] is a 79 yo female with complicated PMH of CIDP, COPD, anemia, GERD, hypertension, hyperlipidemia, rectocele, adenomatous colon polyps status post right hemicolectomy for large sessile serrated adenoma, and gastroparesis who seen for followup. She is here today with her husband.  She was seen in the ER several weeks ago with recurrent upper abdominal pain, nausea and vomiting. This resolved after taking Reglan on a scheduled basis for 2 days.  He is continued gastroparesis diet and is using Reglan very very sparingly. For the most part she is doing well from an upper abdominal pain standpoint. Continues to have good bowel movements with MiraLax. Her dose is 1-1/4 cup every day and a half. This helps her have 1-2 bowel movements per day. She continues to have issues with nausea when she is not taking Zofran. The Zofran is still working very well and she is taking less 3 times a day on most days. She requests refill of this today. Since last being seen here she was seen by GYN and a pessary was placed for prolapse. She is no longer requiring self-catheterization and is having less frequent episodes of urinary incontinence. She is currently urinating every 1.5 hours to avoid an overfilled bladder. She has been seen by neurology at [facility] and plans followup soon. She's also been seen by neurosurgery with plans for cervical stenosis surgery late August 2014.  Of note, she was found to have elevated AST/ALT not on phosphatase on 617 and a PCP appointment with Dr. Link Snuffer.  An abdominal ultrasound was ordered and she brings the disc today.  No right upper quadrant pain, jaundice, or itching.  He continues to be followed by hematology, endocrinology.  Review of Systems As per history of present illness, otherwise negative  Current Medications, Allergies, Past Medical History, Past Surgical History,  Family History and Social History were reviewed in [employer] record.     Objective:   Physical Exam BP 130/80  Pulse 88  Ht 5\' 2"  (1.575 m)  Wt 126 lb 4 oz (57.267 kg)  BMI 23.09 kg/m2 Constitutional: Well-developed and well-nourished. No distress.  HEENT: Normocephalic and atraumatic.No scleral icterus.  Cardiovascular: Normal rate, regular rhythm and intact distal pulses.  Pulmonary/chest: Effort normal and breath sounds normal. No wheezing, rales or rhonchi.  Abdominal: Soft, nontender, nondistended. Bowel sounds active throughout.  Extremities: no clubbing, cyanosis, or edema, change if arthritis b/l hands  Neurological: Alert and oriented to person place and time.  Skin: Skin is warm and dry. No rashes noted.  Psychiatric: Normal mood and affect. Behavior is normal.  Labs obtained 11/12/2012 by [facility] --basic metabolic panel within normal limits, albumin 4.0, AST 31, ALT 82, alkaline phosphatase 345, total bili 0.5 WBC 6.3, hemoglobin 15.2, hematocrit 45.6, MCV 92.4, platelet 333 TSH 1.32 Vit D 76.2  Labs dated 12/10/2012 - anti-smooth muscle antibody negative, anti-mitochondrial antibody negative, hep C virus antibody negative, hep B surface antibody reactive consistent with immunity, hep B. surface antigen negative  CMP     Component Value Date/Time   NA 139 12/06/2012 1909   K 4.0 12/06/2012 1909   CL 104 12/06/2012 1909   CO2 26 12/06/2012 1909   GLUCOSE 97 12/06/2012 1909   BUN 16 12/06/2012 1909   CREATININE 0.60 12/06/2012 1909   CREATININE 0.78 06/27/2012 1139   CALCIUM 9.4 12/06/2012 1909  PROT 7.5 12/24/2012 1208   ALBUMIN 4.2 12/24/2012 1208   AST 31 12/24/2012 1208   ALT 27 12/24/2012 1208   ALKPHOS 111 12/24/2012 1208   BILITOT 0.2* 12/24/2012 1208   GFRNONAA 85* 12/06/2012 1909   GFRAA >90 12/06/2012 1909   Abd [place] - 11/14/2012 -- [facility] - report not available, requested, patient states  normal     Assessment & Plan:  79 yo female with complicated PMH of CIDP, COPD, anemia, GERD, hypertension, hyperlipidemia, rectocele, adenomatous colon polyps status post right hemicolectomy for large sessile serrated adenoma, and gastroparesis who seen for followup  1. Gastroparesis/nausea -- flare for several days earlier this month improved after reinitiation of Reglan. She will continue gastroparesis diet, Zofran for nausea, and Reglan on an as-needed basis. Due to her complicated neurologic history she remains concerned about the side effects which we have discussed at length in the past. I think with her very sporadic and infrequent use the risk of long-term neurologic side effects such of tardive dyskinesia is very very low. She will continue to use this medication as necessary.  2. Vaginal prolapse/dyspareunia/atonic bladder -- much better after her gynecologic assessment and pessary placement    3. Constipation/fecal incontinence -- she does develop left lower quadrant pain if she has not had a bowel movement. I recommend she continue MiraLAX which seems to work well for her.   4. Anemia - hemoglobin currently normal. She has had IV iron in the past and is followed by hematology   5. Hx of large tubular adenoma s/p right hemi-colectomy -- at this point given her other ongoing and more pressing medical issues, we will defer colonoscopy at this point. The large adenoma that was resected was not a malignancy. Can discuss this further in followup. She understands and is in agreement  6.  Recently elevated LFTs -- she did have a bump in her AST, ALT and alkaline phosphatase. These have normalized which is reassuring. Will likely never understand the etiology of the transient elevated liver numbers. Certainly she is at risk for drug-induced liver injury, but given the fact that they have normalized we will make no medication changes today.

## 2012-12-24 NOTE — Patient Instructions (Addendum)
Your physician has requested that you go to the basement for the following lab work before leaving today: hepatic function panel   We have sent the following medications to your pharmacy for you to pick up at your convenience: Zofran, use Reglan as needed and continue taking Miralax daily.  Dr. Rhea Belton will work of referring you to a new PCP  Follow up with Dr. Margretta Sidle in office in 6 months                                                We are excited to introduce MyChart, a new best-in-class service that provides you online access to important information in your electronic medical record. We want to make it easier for you to view your health information - all in one secure location - when and where you need it. We expect MyChart will enhance the quality of care and service we provide.  When you register for MyChart, you can:    View your test results.    Request appointments and receive appointment reminders via email.    Request medication renewals.    View your medical history, allergies, medications and immunizations.    Communicate with your physician's office through a password-protected site.    Conveniently print information such as your medication lists.  To find out if MyChart is right for you, please talk to a member of our clinical staff today. We will gladly answer your questions about this free health and wellness tool.  If you are age 44 or older and want a member of your family to have access to your record, you must provide written consent by completing a proxy form available at our office. Please speak to our clinical staff about guidelines regarding accounts for patients younger than age 29.  As you activate your MyChart account and need any technical assistance, please call the MyChart technical support line at [phone]-CHART [phone]) or email your question to [email or web address]. If you email your question(s), please include your name, a return phone  number and the best time to reach you.  If you have non-urgent health-related questions, you can send a message to our office through MyChart at [email or web address]. If you have a medical emergency, call 911.  Thank you for using MyChart as your new health and wellness resource!   MyChart licensed from [employer],  4540-9811. Patents Pending.

## 2012-12-27 HISTORY — PX: ANTERIOR FUSION CLIVUS-C2 EXTRAORAL W/ ODONTOID EXCISION: SUR618

## 2013-01-03 ENCOUNTER — Encounter: Payer: Self-pay | Admitting: Diagnostic Neuroimaging

## 2013-01-06 ENCOUNTER — Telehealth: Payer: Self-pay | Admitting: Internal Medicine

## 2013-01-06 NOTE — Telephone Encounter (Signed)
Patient advised of normal LFTs.

## 2013-01-10 ENCOUNTER — Encounter: Payer: Self-pay | Admitting: Family Medicine

## 2013-01-30 ENCOUNTER — Ambulatory Visit (HOSPITAL_BASED_OUTPATIENT_CLINIC_OR_DEPARTMENT_OTHER): Payer: Medicare Other | Admitting: Hematology & Oncology

## 2013-01-30 ENCOUNTER — Other Ambulatory Visit (HOSPITAL_BASED_OUTPATIENT_CLINIC_OR_DEPARTMENT_OTHER): Payer: Medicare Other | Admitting: Lab

## 2013-01-30 VITALS — BP 158/89 | HR 101 | Temp 98.0°F | Resp 16 | Ht 62.0 in | Wt 127.0 lb

## 2013-01-30 DIAGNOSIS — D509 Iron deficiency anemia, unspecified: Secondary | ICD-10-CM

## 2013-01-30 DIAGNOSIS — D649 Anemia, unspecified: Secondary | ICD-10-CM

## 2013-01-30 DIAGNOSIS — G6181 Chronic inflammatory demyelinating polyneuritis: Secondary | ICD-10-CM

## 2013-01-30 LAB — CBC WITH DIFFERENTIAL (CANCER CENTER ONLY)
BASO#: 0.1 10*3/uL (ref 0.0–0.2)
BASO%: 1 % (ref 0.0–2.0)
EOS%: 4.8 % (ref 0.0–7.0)
Eosinophils Absolute: 0.3 10*3/uL (ref 0.0–0.5)
HCT: 40.7 % (ref 34.8–46.6)
HGB: 13.8 g/dL (ref 11.6–15.9)
LYMPH#: 2.9 10*3/uL (ref 0.9–3.3)
LYMPH%: 42.8 % (ref 14.0–48.0)
MCH: 30.6 pg (ref 26.0–34.0)
MCHC: 33.9 g/dL (ref 32.0–36.0)
MCV: 90 fL (ref 81–101)
MONO#: 0.8 10*3/uL (ref 0.1–0.9)
MONO%: 10.9 % (ref 0.0–13.0)
NEUT#: 2.8 10*3/uL (ref 1.5–6.5)
NEUT%: 40.5 % (ref 39.6–80.0)
Platelets: 252 10*3/uL (ref 145–400)
RBC: 4.51 10*6/uL (ref 3.70–5.32)
RDW: 14 % (ref 11.1–15.7)
WBC: 6.9 10*3/uL (ref 3.9–10.0)

## 2013-01-30 LAB — IRON AND TIBC CHCC
%SAT: 23 % (ref 21–57)
Iron: 55 ug/dL (ref 41–142)
TIBC: 241 ug/dL (ref 236–444)
UIBC: 186 ug/dL (ref 120–384)

## 2013-01-30 LAB — FERRITIN CHCC: Ferritin: 133 ng/ml (ref 9–269)

## 2013-01-30 NOTE — Progress Notes (Signed)
This office note has been dictated.

## 2013-01-31 ENCOUNTER — Other Ambulatory Visit: Payer: Self-pay | Admitting: *Deleted

## 2013-01-31 NOTE — Progress Notes (Signed)
CC:   Julia Penna, MD, Fax [phone]  DIAGNOSES: 1. Iron-deficiency anemia. 2. Peripheral neuropathy. 3. Status post cervical spine fusion.  CURRENT THERAPY:  IV iron as indicated.  INTERIM HISTORY:  [name] comes in for followup.  We last saw her back in July.  Since then, she did undergo a cervical spine fusion. This has helped her quite a bit.  She does not feel as much neck pain. She has a cervical brace on right now.  She has not noted any problems with nausea or vomiting.  She has had no problems with cough or shortness of breath.  There has been no change in bowel or bladder habits.  When we last saw her, her ferritin was 138 with an iron saturation of 24%.  I think she last got iron back in October of last year.  PHYSICAL EXAMINATION:  General:  This is a petite white female in no obvious distress.  Vital Signs:  Show a temperature of 98, pulse 101, respiratory rate 16, blood pressure 158/89.  Weight is 127.  Head and Neck Exam:  Shows a surgical scar on her right neck.  She has a cervical collar on.  There are no obvious intraoral lesions.  There is no scleral icterus or conjunctival pallor.  Lungs:  Clear bilaterally.  Cardiac Exam:  Regular rate and rhythm with a normal S1 and S2.  There are no murmurs, rubs or bruits.  Abdomen:  Soft.  She has good bowel sounds. There is no fluid wave.  There is no palpable hepatosplenomegaly. Extremities:  Show no clubbing, cyanosis or edema.  Neurological Exam: Shows no focal neurological deficits.  LABORATORY STUDIES:  Show a white cell count of 6.9, hemoglobin 13.8, hematocrit 40.7, platelet count 252.  Ferritin is 133.  Iron saturation is 23%.  Iron is 55.  IMPRESSION:  [name] is a very charming 79 year old white female with history of iron-deficiency anemia.  We have taken care of this from my point of view.  Her hemoglobin is holding steady.  I am glad that she had this neck surgery.  This has helped her  quality of life.  I do not think we need to get her back to see [place] probably for 6 months now.  I just feel that her iron depletion was temporary and we have replaced her iron stores nicely.    ______________________________ Josph Macho, M.D. PRE/MEDQ  D:  01/30/2013  T:  01/31/2013  Job:  [id number]

## 2013-01-31 NOTE — Telephone Encounter (Signed)
Faxed refill request received from pharmacy for Lidoderm 5% patch Last filled by MD on 10.29.13, #60x6 Per patient Email, changed PCP provider in April 2014 to Dr. Alysia Penna Request Denied, no longer seen by Dr. Einar Grad

## 2013-02-03 LAB — COMPREHENSIVE METABOLIC PANEL
ALT: 17 U/L (ref 0–35)
AST: 27 U/L (ref 0–37)
Albumin: 4.1 g/dL (ref 3.5–5.2)
Alkaline Phosphatase: 92 U/L (ref 39–117)
BUN: 17 mg/dL (ref 6–23)
CO2: 28 mEq/L (ref 19–32)
Calcium: 8.5 mg/dL (ref 8.4–10.5)
Chloride: 106 mEq/L (ref 96–112)
Creatinine, Ser: 0.54 mg/dL (ref 0.50–1.10)
Glucose, Bld: 73 mg/dL (ref 70–99)
Potassium: 4.7 mEq/L (ref 3.5–5.3)
Sodium: 140 mEq/L (ref 135–145)
Total Bilirubin: 0.3 mg/dL (ref 0.3–1.2)
Total Protein: 6.8 g/dL (ref 6.0–8.3)

## 2013-02-03 LAB — PROTEIN ELECTROPHORESIS, SERUM, WITH REFLEX
Albumin ELP: 57.5 % (ref 55.8–66.1)
Alpha-1-Globulin: 5 % — ABNORMAL HIGH (ref 2.9–4.9)
Alpha-2-Globulin: 12.4 % — ABNORMAL HIGH (ref 7.1–11.8)
Beta 2: 5.4 % (ref 3.2–6.5)
Beta Globulin: 6.2 % (ref 4.7–7.2)
Gamma Globulin: 13.5 % (ref 11.1–18.8)
Total Protein, Serum Electrophoresis: 6.8 g/dL (ref 6.0–8.3)

## 2013-02-03 LAB — RETICULOCYTES (CHCC)
ABS Retic: 37.4 10*3/uL (ref 19.0–186.0)
RBC.: 4.68 MIL/uL (ref 3.87–5.11)
Retic Ct Pct: 0.8 % (ref 0.4–2.3)

## 2013-02-17 ENCOUNTER — Encounter: Payer: Self-pay | Admitting: Internal Medicine

## 2013-03-06 ENCOUNTER — Telehealth: Payer: Self-pay | Admitting: *Deleted

## 2013-03-06 DIAGNOSIS — J45909 Unspecified asthma, uncomplicated: Secondary | ICD-10-CM

## 2013-03-06 DIAGNOSIS — M810 Age-related osteoporosis without current pathological fracture: Secondary | ICD-10-CM

## 2013-03-06 DIAGNOSIS — I10 Essential (primary) hypertension: Secondary | ICD-10-CM

## 2013-03-06 DIAGNOSIS — R5383 Other fatigue: Secondary | ICD-10-CM

## 2013-03-06 NOTE — Telephone Encounter (Signed)
Message copied by Florene Glen on Thu Mar 06, 2013  9:00 AM ------      Message from: Willow Ora E      Created: Wed Mar 05, 2013  3:13 PM       Aram Beecham, I don't usually see new Medicare patients unless they are referred by an other physician consequently I will be glad to see her.      When they call to make an appointment, they need to specifically say I accepted her in my panel.      Thank you!      JP                  ----- Message -----         From: Linna Hoff, RN         Sent: 03/05/2013   1:55 PM           To: Wanda Plump, MD            Dr Drue Novel, this lady has asked Dr Rhea Belton to help her find a new PCP. She and Dr Rhea Belton discussed the situation and they feel she intimidates doctors with her multiple meds and problems. Would you look over her SNAPSHOT and see if you could add this pt on? If not interested, just reply back. Thanks. Graciella Freer, RN for doctors Pyrtle and Jarold Motto                   ------

## 2013-03-06 NOTE — Telephone Encounter (Signed)
lmom for pt to call back

## 2013-03-07 NOTE — Telephone Encounter (Signed)
Scheduled pt to see Dr Willow Ora on 04/01/13 at 8am. The address is [address] in [place]; telephone [phone]. The pa has been informed, but is traveling today.

## 2013-03-28 ENCOUNTER — Telehealth: Payer: Self-pay

## 2013-03-28 NOTE — Telephone Encounter (Signed)
Medication and allergies: reviewed and updated  90 day supply/mail order: na Local pharmacy: [facility] [employer]   Immunizations due:  UTD with exception Shingles vaccine  A/P:   No changes to [facility] or [facility]--05/2012--Dr Pyrtle--recall for Jan 2015--FH polyps--patient had polyps Dexa--08/2011--osteoporosis  To Discuss with Provider: Looking forward to meeting you

## 2013-04-01 ENCOUNTER — Ambulatory Visit (INDEPENDENT_AMBULATORY_CARE_PROVIDER_SITE_OTHER): Payer: Medicare Other | Admitting: Internal Medicine

## 2013-04-01 ENCOUNTER — Encounter: Payer: Self-pay | Admitting: Internal Medicine

## 2013-04-01 VITALS — BP 122/76 | HR 91 | Temp 97.8°F

## 2013-04-01 DIAGNOSIS — M542 Cervicalgia: Secondary | ICD-10-CM

## 2013-04-01 DIAGNOSIS — G6181 Chronic inflammatory demyelinating polyneuritis: Secondary | ICD-10-CM

## 2013-04-01 DIAGNOSIS — I10 Essential (primary) hypertension: Secondary | ICD-10-CM

## 2013-04-01 DIAGNOSIS — E785 Hyperlipidemia, unspecified: Secondary | ICD-10-CM

## 2013-04-01 DIAGNOSIS — G4710 Hypersomnia, unspecified: Secondary | ICD-10-CM

## 2013-04-01 DIAGNOSIS — R7989 Other specified abnormal findings of blood chemistry: Secondary | ICD-10-CM | POA: Insufficient documentation

## 2013-04-01 DIAGNOSIS — R569 Unspecified convulsions: Secondary | ICD-10-CM

## 2013-04-01 DIAGNOSIS — G8929 Other chronic pain: Secondary | ICD-10-CM

## 2013-04-01 DIAGNOSIS — E039 Hypothyroidism, unspecified: Secondary | ICD-10-CM

## 2013-04-01 LAB — LIPID PANEL
Cholesterol: 194 mg/dL (ref 0–200)
HDL: 71 mg/dL (ref >39.00)
LDL Cholesterol: 106 mg/dL — ABNORMAL HIGH (ref 0–99)
Total CHOL/HDL Ratio: 3
Triglycerides: 86 mg/dL (ref 0.0–149.0)
VLDL: 17.2 mg/dL (ref 0.0–40.0)

## 2013-04-01 LAB — TSH: TSH: 1.05 u[IU]/mL (ref 0.35–5.50)

## 2013-04-01 NOTE — Assessment & Plan Note (Signed)
Seems well-controlled, last BMP normal, no change 

## 2013-04-01 NOTE — Assessment & Plan Note (Signed)
On prolia Bone density test 04/2012 show a T score of -3.0 at the hip,done at [facility]

## 2013-04-01 NOTE — Assessment & Plan Note (Signed)
Reports good compliance of medication, labs 

## 2013-04-01 NOTE — Assessment & Plan Note (Signed)
Last seen by local  neurology/2014, was referred to [facility]

## 2013-04-01 NOTE — Progress Notes (Signed)
Subjective:    Patient ID: [name], female    DOB: [birth year], 79 y.o.   MRN: [id number]  HPI New patient to me, needs a PCP closer to home, referred by [facility];  in the past saw Dr Ty Hilts, Dr Abner Greenspan and most recent PCP was Dr. Link Snuffer. History of chronic neck pain, status post surgery at [facility], pain has decrease, at this point she is not needing any narcotics. Has chronic back pain, currently well controlled with Lidoderm patches as needed. Chronic finger pain, on topical voltaren Respiratory issues--she sees Dr.Beauford on regular basis and Dr. Delford Field when needed. Hyperlipidemia, good compliance with Lipitor, due for labs Hypertension, good compliance medication, normal ambulatory BPs History of seizure disorder, on phenobarbital, last seizure activity 1992. Osteoporosis, good compliance with prolia  Chronic fatigue syndrome, hypersomnia, on adderall 4 times a day, sx well controlled ; Has been on such medication for many years. Prescription was started by her doctors at [facility]  Past Medical History  Diagnosis Date  . Anemia   . Anxiety -depression     patient denied  . Asthma -COPD   . Chronic fatigue syndrome   . GERD (gastroesophageal reflux disease)   . Hyperlipidemia   . Hypertension   . Colon polyp   . Allergic rhinitis   . Vitamin D deficiency   . Pulmonary embolism     1980s  . Neuropathy   . Arthritis   . 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 Blood transfusion   . Osteoporosis   . Hypothyroid   . Constipation   . Family history of anesthesia complication     Daughter with history of malignant hyperthermia  . Seizures     1990 last seizures on meds Phenobarb  . Skin cancer     squamous cell  . Neurogenic bladder 2013    husband caths pt  . chronic sinusitis 08/08/2012  . Shingles late 35's  . Cystocele 09/16/2012  . Tubular adenoma    Past Surgical History  Procedure Laterality Date  . Tonsillectomy and adenoidectomy     . Nasal septum surgery    . Dilation and curettage of uterus    . Tubal ligation    . Cesarean section    . Sinus surgeries      x 4  . Left ovary and tube removed    . Vocal polyps removed    . Appendectomy    . Cystocele repair    . Carpal tunnel release      right  . Shoulder arthroscopy      x2 left, 1 right  . Left finger fusion    . Right median nerve decompression    . Duptyren's contracture right hand    . Finger ganglion cyst excision      right  . Abdominal hysterectomy    . Joint replacement      right and left basal joints of thumbs  . Bladder suspension    . Cataract extraction      bilateral  . Cervical neck ablation      x 7, C3-C4  . Squamous lesions removed      neck and face  . Basal cell carcinoma excision      face  . Panniculectomy    . Nasal polyp surgery      4   History   Social History  . Marital Status: Married    Spouse Name: N/A    Number of Children: 3  .  Years of Education: N/A   Occupational History  . retired, Geologist, engineering    Social History Main Topics  . Smoking status: Former Smoker -- 0.50 packs/day for 3 years    Types: Cigarettes    Quit date: 05/29/1978  . Smokeless tobacco: Never Used  . Alcohol Use: No  . Drug Use: No  . Sexual Activity: Yes   Other Topics Concern  . Not on file   Social History Narrative   Pt lives at home with her spouse.   Moved to [place] ~ 2004   Family History  Problem Relation Age of Onset  . Heart disease Mother   . Hyperlipidemia Mother     Judie Petit, aunt  . Allergies Sister   . Ovarian cancer Sister   . Lung cancer Sister     lung - stage 1  . Thyroid cancer Sister   . Colon cancer Father   . Stomach cancer Other     first cousin  . Skin cancer Daughter   . Malignant hyperthermia Daughter     Review of Systems     Objective:   Physical Exam BP 122/76  Pulse 91  Temp(Src) 97.8 F (36.6 C)  SpO2 98% General -- alert, well-developed, NAD.   Lungs -- normal respiratory  effort, no intercostal retractions, no accessory muscle use, and slt decreased  breath sounds.  Heart-- normal rate, regular rhythm, no murmur.  Extremities-- no pretibial edema bilaterally; fingers w/  deformities consistent with DJD  Neurologic--  alert & oriented X3. Speech normal,  Psych-- Cognition and judgment appear intact. Cooperative with normal attention span and concentration. No anxious appearing , no depressed appearing.      Assessment & Plan:   Today I spent a great majority of this 40 minute visit getting familiar w/ her multiple medical issues .  Patient with multiple medical issues, I told the patient I will manage her thyroid disease, hypertension, high cholesterol, adderalll and  phenobarbital refills. For other questions need to contact her specialists to

## 2013-04-01 NOTE — Assessment & Plan Note (Signed)
Used to see Dr. Derenda Fennel, had surgery at [facility] I believe several weeks ago, doing better, not needing narcotic at this time

## 2013-04-01 NOTE — Assessment & Plan Note (Addendum)
Notes from previous PCP reviewed, she has elevated LFTs back on 10/2012, AST 81, ALT 82, alkaline phosphatase 345. Ultrasound of the right upper quadrant showed an unremarkable liver and CBD without stones. Labs from 02/17/2013---->  normalization of LFTs per records reviewed.

## 2013-04-01 NOTE — Assessment & Plan Note (Signed)
Due for labs

## 2013-04-01 NOTE — Assessment & Plan Note (Addendum)
Currently on phenobarbital, no seizure activity since the 90s. Check phenobarbital levels

## 2013-04-01 NOTE — Patient Instructions (Signed)
Get your blood work before you leave  Next visit in 4-6 weeks for a follow up (30 minutes appointment). No Fasting Please make an appointment    Please get the records from your last  Primary physician and specifically  regards the use of adderall  If you have questions about your thyroid disease, high blood pressure, high cholesterol, adderall  refills or phenobarbital refills let me know

## 2013-04-01 NOTE — Assessment & Plan Note (Addendum)
Reports a history of chronic fatigue syndrome and hypersomnia, on adderall  with good results. Her previous PCP Dr. Link Snuffer was in charge of RFs, I don't have an issue doing that as long as I get some documentation. She also needs a Controlled substance contract and a UDS.

## 2013-04-02 ENCOUNTER — Telehealth: Payer: Self-pay | Admitting: *Deleted

## 2013-04-02 DIAGNOSIS — R569 Unspecified convulsions: Secondary | ICD-10-CM

## 2013-04-02 LAB — PHENOBARBITAL LEVEL: Phenobarbital: 45 ug/mL — ABNORMAL HIGH (ref 15.0–40.0)

## 2013-04-02 NOTE — Telephone Encounter (Signed)
Message copied by Eustace Quail on Wed Apr 02, 2013 10:38 AM ------      Message from: Willow Ora E      Created: Wed Apr 02, 2013  9:03 AM       Call patient:      Cholesterol is satisfactory,Thyroid is well balanced      Phenobarbital slightly elevated, currently takes Phenobarbital: one tablet at 6 am, one tablet at noon and two tablets at 8 pm.      New dose ----- one tablet at 6 am, one tablet at noon and ONE tablet at 8 pm.      Arrange labs: phenobarbital level, dx seizures 4 weeks from now       ------

## 2013-04-02 NOTE — Telephone Encounter (Signed)
Pt notified. Lab ordered. Pt to call back to schedule a lab visit. DJR

## 2013-04-16 ENCOUNTER — Other Ambulatory Visit: Payer: Self-pay | Admitting: Family Medicine

## 2013-04-16 ENCOUNTER — Telehealth: Payer: Self-pay | Admitting: *Deleted

## 2013-04-16 DIAGNOSIS — G6181 Chronic inflammatory demyelinating polyneuritis: Secondary | ICD-10-CM

## 2013-04-16 MED ORDER — AMPHETAMINE-DEXTROAMPHETAMINE 10 MG PO TABS: 10.0000 mg | ORAL_TABLET | Freq: Four times a day (QID) | ORAL | Status: DC

## 2013-04-16 NOTE — Telephone Encounter (Signed)
Okay to prescribe #120, Rx printed. Needs an agreement and UDS at time of  prescription pickup. Also need documentation from previous MD

## 2013-04-16 NOTE — Telephone Encounter (Signed)
04/16/2013  Patient called  Requesting refill on amphetamine-dextroamphetamine (ADDERALL) 10 MG tablet.  On 09/16/2012, prescription was given by Dr. Abner Greenspan.  Patient has been getting by Dr. Link Snuffer at [facility].  Request for medical records was sent this morning to [facility].  Patient states she will be out 04/17/2013.  Please advise.  bw

## 2013-04-16 NOTE — Telephone Encounter (Signed)
Patient is requesting refill on Adderall. Last OV 04/01/13 Last filled 09/16/12 #120 (Hypersomnia, takes 1 po qid) No agreement on file No UDS on file. Okay to refill

## 2013-04-17 NOTE — Telephone Encounter (Signed)
Left message on voice amil that Rx was available for pick up. Contract printed and attached to prescription, note also on script that pt needs UDS.

## 2013-05-01 ENCOUNTER — Ambulatory Visit (HOSPITAL_BASED_OUTPATIENT_CLINIC_OR_DEPARTMENT_OTHER): Payer: Medicare Other | Admitting: Hematology & Oncology

## 2013-05-01 ENCOUNTER — Ambulatory Visit (INDEPENDENT_AMBULATORY_CARE_PROVIDER_SITE_OTHER): Payer: Medicare Other | Admitting: Internal Medicine

## 2013-05-01 ENCOUNTER — Other Ambulatory Visit (HOSPITAL_BASED_OUTPATIENT_CLINIC_OR_DEPARTMENT_OTHER): Payer: Medicare Other | Admitting: Lab

## 2013-05-01 ENCOUNTER — Encounter: Payer: Self-pay | Admitting: Internal Medicine

## 2013-05-01 VITALS — BP 121/72 | HR 86 | Temp 97.4°F | Wt 127.0 lb

## 2013-05-01 DIAGNOSIS — E785 Hyperlipidemia, unspecified: Secondary | ICD-10-CM

## 2013-05-01 DIAGNOSIS — D649 Anemia, unspecified: Secondary | ICD-10-CM

## 2013-05-01 DIAGNOSIS — G6181 Chronic inflammatory demyelinating polyneuritis: Secondary | ICD-10-CM

## 2013-05-01 DIAGNOSIS — N281 Cyst of kidney, acquired: Secondary | ICD-10-CM

## 2013-05-01 DIAGNOSIS — M542 Cervicalgia: Secondary | ICD-10-CM

## 2013-05-01 DIAGNOSIS — G4710 Hypersomnia, unspecified: Secondary | ICD-10-CM

## 2013-05-01 DIAGNOSIS — N8111 Cystocele, midline: Secondary | ICD-10-CM

## 2013-05-01 DIAGNOSIS — N816 Rectocele: Secondary | ICD-10-CM

## 2013-05-01 DIAGNOSIS — N319 Neuromuscular dysfunction of bladder, unspecified: Secondary | ICD-10-CM

## 2013-05-01 DIAGNOSIS — E559 Vitamin D deficiency, unspecified: Secondary | ICD-10-CM

## 2013-05-01 DIAGNOSIS — IMO0002 Reserved for concepts with insufficient information to code with codable children: Secondary | ICD-10-CM

## 2013-05-01 DIAGNOSIS — R569 Unspecified convulsions: Secondary | ICD-10-CM

## 2013-05-01 DIAGNOSIS — G609 Hereditary and idiopathic neuropathy, unspecified: Secondary | ICD-10-CM

## 2013-05-01 DIAGNOSIS — G8929 Other chronic pain: Secondary | ICD-10-CM

## 2013-05-01 DIAGNOSIS — I10 Essential (primary) hypertension: Secondary | ICD-10-CM

## 2013-05-01 LAB — CBC WITH DIFFERENTIAL (CANCER CENTER ONLY)
BASO#: 0 10*3/uL (ref 0.0–0.2)
BASO%: 0.5 % (ref 0.0–2.0)
EOS%: 4 % (ref 0.0–7.0)
Eosinophils Absolute: 0.2 10*3/uL (ref 0.0–0.5)
HCT: 40.9 % (ref 34.8–46.6)
HGB: 13.9 g/dL (ref 11.6–15.9)
LYMPH#: 2.6 10*3/uL (ref 0.9–3.3)
LYMPH%: 43.6 % (ref 14.0–48.0)
MCH: 30.2 pg (ref 26.0–34.0)
MCHC: 34 g/dL (ref 32.0–36.0)
MCV: 89 fL (ref 81–101)
MONO#: 0.5 10*3/uL (ref 0.1–0.9)
MONO%: 8.1 % (ref 0.0–13.0)
NEUT#: 2.6 10*3/uL (ref 1.5–6.5)
NEUT%: 43.8 % (ref 39.6–80.0)
Platelets: 214 10*3/uL (ref 145–400)
RBC: 4.6 10*6/uL (ref 3.70–5.32)
RDW: 12.8 % (ref 11.1–15.7)
WBC: 6 10*3/uL (ref 3.9–10.0)

## 2013-05-01 LAB — CHCC SATELLITE - SMEAR

## 2013-05-01 MED ORDER — LIDOCAINE 5 % EX PTCH: 2.0000 | MEDICATED_PATCH | Freq: Every morning | CUTANEOUS | Status: DC

## 2013-05-01 MED ORDER — AMLODIPINE BESYLATE 10 MG PO TABS: 10.0000 mg | ORAL_TABLET | Freq: Every morning | ORAL | Status: DC

## 2013-05-01 MED ORDER — ATORVASTATIN CALCIUM 40 MG PO TABS: 40.0000 mg | ORAL_TABLET | Freq: Every day | ORAL | Status: DC

## 2013-05-01 MED ORDER — PHENOBARBITAL 32.4 MG PO TABS: 32.4000 mg | ORAL_TABLET | Freq: Three times a day (TID) | ORAL | Status: DC

## 2013-05-01 MED ORDER — VITAMIN D (ERGOCALCIFEROL) 1.25 MG (50000 UNIT) PO CAPS: ORAL_CAPSULE | ORAL | Status: DC

## 2013-05-01 NOTE — Assessment & Plan Note (Signed)
Seems well controlled, RF meds

## 2013-05-01 NOTE — Progress Notes (Signed)
Pre visit review using our clinic review tool, if applicable. No additional management support is needed unless otherwise documented below in the visit note. 

## 2013-05-01 NOTE — Assessment & Plan Note (Signed)
Using Lidoderm patch that I am rxing , may not be covered by her insurance Next time will have to do a PA

## 2013-05-01 NOTE — Progress Notes (Signed)
Subjective:    Patient ID: [name], female    DOB: [birth year], 79 y.o.   MRN: [id number]  HPI Routine office visit  Here for management of hypertension, seizure disorder, Adderall   and lidocaine patch refills. Apparently Adderall 10 mg Lidoderm patch won't be covered by her insurance next year   Past Medical History  Diagnosis Date  . Anemia   . Anxiety -depression     patient denied  . Asthma -COPD   . Chronic fatigue syndrome   . GERD (gastroesophageal reflux disease)   . Hyperlipidemia   . Hypertension   . Colon polyp   . Allergic rhinitis   . Vitamin D deficiency   . Pulmonary embolism     1980s  . Neuropathy   . Arthritis   . 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 Blood transfusion   . Osteoporosis   . Hypothyroid   . Constipation   . Family history of anesthesia complication     Daughter with history of malignant hyperthermia  . Seizures     1990 last seizures on meds Phenobarb  . Skin cancer     squamous cell  . Neurogenic bladder 2013    husband caths pt  . chronic sinusitis 08/08/2012  . Shingles late 41's  . Cystocele 09/16/2012  . Tubular adenoma      Past Surgical History  Procedure Laterality Date  . Tonsillectomy and adenoidectomy    . Nasal septum surgery    . Dilation and curettage of uterus    . Tubal ligation    . Cesarean section    . Sinus surgeries      x 4  . Left ovary and tube removed    . Vocal polyps removed    . Appendectomy    . Cystocele repair    . Carpal tunnel release      right  . Shoulder arthroscopy      x2 left, 1 right  . Left finger fusion    . Right median nerve decompression    . Duptyren's contracture right hand    . Finger ganglion cyst excision      right  . Abdominal hysterectomy    . Joint replacement      right and left basal joints of thumbs  . Bladder suspension    . Cataract extraction      bilateral  . Cervical neck ablation      x 7, C3-C4  . Squamous lesions  removed      neck and face  . Basal cell carcinoma excision      face  . Panniculectomy    . Nasal polyp surgery      4    Review of Systems States that in general feels well, her allergies are still an issue. She did not decrease phenobarbital dose, see history of present illness, otherwise good medication compliance.   Objective:   Physical Exam BP 121/72  Pulse 86  Temp(Src) 97.4 F (36.3 C)  Wt 127 lb (57.607 kg)  SpO2 96% General -- alert, well-developed, NAD.   Lungs -- normal respiratory effort, no intercostal retractions, no accessory muscle use, and slt decreased but otherwise normal  breath sounds.  Heart-- normal rate, regular rhythm, no murmur.  Extremities-- no pretibial edema bilaterally  Psych-- Cognition and judgment appear intact. Cooperative with normal attention span and concentration. No anxious appearing , no depressed appearing.      Assessment & Plan:  Old records: I received approximately 1,000 pages of  old records, I plan to review them with every visit, today I review some of them ~ 100, Information was documented in the assessment and plan, I will also scan only the most relevant pages  Today , I spent more than 50  min with the patient, >50% of the time counseling, and /or reviewing records and XRs ordered by other providers

## 2013-05-01 NOTE — Assessment & Plan Note (Signed)
adderall may not be covered by her insurance next year, will have to do a PA

## 2013-05-01 NOTE — Patient Instructions (Addendum)
Get your blood work before you leave  Next visit for a   follow up (30 minutes)  no fasting, in 4-5  months  Please make an appointment     If you have questions about your thyroid disease, high blood pressure, high cholesterol, adderall  refills or phenobarbital refills let me know

## 2013-05-01 NOTE — Assessment & Plan Note (Signed)
Labs phenobarbital level is slightly elevated, was recommended to decrease phenobarbital from 2 tablets a day to 2 tablets daily, still taking 4 tablets a day. Labs

## 2013-05-01 NOTE — Assessment & Plan Note (Signed)
Last FLP satisfactory, RF meds

## 2013-05-01 NOTE — Progress Notes (Signed)
This office note has been dictated.

## 2013-05-01 NOTE — Assessment & Plan Note (Signed)
On ergocalciferol weekly for more than 2 years per hematology recommendation, not on qd supplements, request a refill, it would be provided, labs

## 2013-05-02 ENCOUNTER — Telehealth: Payer: Self-pay | Admitting: Hematology & Oncology

## 2013-05-02 LAB — VITAMIN D 25 HYDROXY (VIT D DEFICIENCY, FRACTURES): Vit D, 25-Hydroxy: 81 ng/mL (ref 30–89)

## 2013-05-02 LAB — FERRITIN CHCC: Ferritin: 132 ng/ml (ref 9–269)

## 2013-05-02 LAB — IRON AND TIBC CHCC
%SAT: 33 % (ref 21–57)
Iron: 96 ug/dL (ref 41–142)
TIBC: 286 ug/dL (ref 236–444)
UIBC: 191 ug/dL (ref 120–384)

## 2013-05-02 LAB — PHENOBARBITAL LEVEL: Phenobarbital: 36.2 ug/mL (ref 15.0–40.0)

## 2013-05-02 NOTE — Telephone Encounter (Signed)
Mailed [phone] schedule

## 2013-05-04 DIAGNOSIS — N281 Cyst of kidney, acquired: Secondary | ICD-10-CM | POA: Insufficient documentation

## 2013-05-04 LAB — TRANSFERRIN RECEPTOR, SOLUABLE: Transferrin Receptor, Soluble: 1.67 mg/L (ref 0.76–1.76)

## 2013-05-04 NOTE — Assessment & Plan Note (Addendum)
Note from gynecology reviewed, seen 02/06/2013,Reportedly doing better with use of a pessary which they are managing

## 2013-05-04 NOTE — Assessment & Plan Note (Signed)
Records from [facility] reviewed She was diagnosed with axonal polyneuropathy and severe cervical myelopathy.  She was seen at [facility] several times During 2014 MRI of the brain and neck 10-2012: show small vessel disease of the brain, DJD and   severe or cervical stenosis. Some of her symptoms are b/b ncontinence, wide gate, decrease manual dexterity, areflexia. She eventually had Extensive anterior approach cervical surgery 01/24/2013.

## 2013-05-04 NOTE — Assessment & Plan Note (Signed)
See neurogenic bladder

## 2013-05-04 NOTE — Assessment & Plan Note (Signed)
MRI 12/02/2011 showed multiple bilateral renal cysts. Also biliary sludge

## 2013-05-04 NOTE — Assessment & Plan Note (Addendum)
Has had extensive urological history. Cystocele repair in 1976 with abdominal hysterectomy. Cystocele repair in 2008 Dr. Sabino Gasser without vaginal mesh and placement of autologous vaginal sling Cystocele reduction June 2012 Dr. Sabino Gasser placement of TOT sling. History of neurogenic bladder, followup by urology, urodynamics study 03-22-12  they are currently recommending visit 12/2012 a pessary and bladder catheterization as needed

## 2013-05-05 ENCOUNTER — Encounter: Payer: Self-pay | Admitting: *Deleted

## 2013-05-05 NOTE — Progress Notes (Signed)
CC:   Julia Ora, MD  DIAGNOSES: 1. Iron deficiency anemia. 2. Peripheral neuropathy of unclear etiology.  CURRENT THERAPY:  IV iron as indicated.  INTERIM HISTORY:  [name] comes in for followup.  She is doing okay. The neuropathy continues to be a problem for her.  She is having problems with cramps.  This is mostly at nighttime.  This appears to be in her lower legs.  We last gave her iron back in October of 2013.  When we last saw her in September, her ferritin was 133 with an iron saturation of 23%.  We did a monoclonal gammopathy workup on her.  She did not have a monoclonal spike in her serum.  She still has a neuropathy.  She goes out to see a doctor at [facility] for this.  She had an MRI of the lumbar spine yesterday.  The results are pending.  She has had no bleeding.  There has been no change in bowel or bladder habits.  She is incredibly kind and brought me a pumpkin cranberry loaf and a nice ornament.  She has had no fever.  She has had no change in her medications.  PHYSICAL EXAMINATION:  General:  This is a thin but well-nourished white female in no obvious distress.  Vital Signs:  Temperature of 97.5, pulse 90, respiratory rate 16, blood pressure 130/75.  Weight is 126 pounds. Head and Neck:  Normocephalic, atraumatic skull.  There are no ocular or oral lesions.  There are no palpable cervical or supraclavicular lymph nodes.  Lungs:  Clear bilaterally.  Cardiac:  Regular rate and rhythm with a normal S1, S2.  There are no murmurs, rubs, or bruits.  Abdomen: Soft.  She has good bowel sounds.  There is no fluid wave.  There is no palpable abdominal mass.  There is no palpable hepatosplenomegaly. Back:  No tenderness over the spine, ribs, or hips.  Extremities:  No clubbing, cyanosis, or edema.  She has osteoarthritic changes in her joints.  She has some muscle atrophy in upper and lower extremities. Skin:  No rashes, ecchymosis, or petechia.  LABORATORY DATA:   White cell count is 6, hemoglobin 13.9, hematocrit 40.9, platelet count 214.  MCV is 89.  IMPRESSION:  [name] is a very charming 79 year old white female. She has a history of iron deficiency anemia.  She did have a colonic polyp removed back in April of 2013.  This was not a malignant polyp.  For now, I do not think that we have to give her IV iron pill.  We would like to see what her iron studies show.  I am not sure why she has this neuropathy.  I certainly cannot see anything on her lab work that looks unusual.  We will go ahead and plan to get her back in another, I think, 4 months or so.    ______________________________ Josph Macho, M.D. PRE/MEDQ  D:  05/01/2013  T:  05/05/2013  Job:  [id number]

## 2013-05-06 ENCOUNTER — Telehealth: Payer: Self-pay | Admitting: Nurse Practitioner

## 2013-05-06 NOTE — Telephone Encounter (Addendum)
Message copied by Glee Arvin on Tue May 06, 2013  3:13 PM ------      Message from: Arlan Organ R      Created: Mon May 05, 2013  9:42 PM       Call - iron is ok!! Cindee Lame ------Pt verbalized understanding and appreciation.

## 2013-05-16 ENCOUNTER — Telehealth: Payer: Self-pay | Admitting: *Deleted

## 2013-05-16 ENCOUNTER — Encounter: Payer: Self-pay | Admitting: Internal Medicine

## 2013-05-16 DIAGNOSIS — G6181 Chronic inflammatory demyelinating polyneuritis: Secondary | ICD-10-CM

## 2013-05-16 MED ORDER — AMPHETAMINE-DEXTROAMPHETAMINE 10 MG PO TABS: 10.0000 mg | ORAL_TABLET | Freq: Four times a day (QID) | ORAL | Status: DC

## 2013-05-16 NOTE — Telephone Encounter (Signed)
Spoke to pt husband, told him prescription was ready to pick up and at the front desk.  He is picking up today before 5.  bw

## 2013-05-16 NOTE — Telephone Encounter (Signed)
done

## 2013-05-16 NOTE — Addendum Note (Signed)
Addended by: Willow Ora E on: 05/16/2013 04:01 PM   Modules accepted: Orders

## 2013-05-16 NOTE — Telephone Encounter (Signed)
rx refill-Adderall 10 mg Last OV- 04/01/13 Last refilled- 04/16/13 #120 / 0 rf  UDS contract - 04/11/13

## 2013-05-16 NOTE — Telephone Encounter (Signed)
Tell pt is ready

## 2013-05-27 ENCOUNTER — Other Ambulatory Visit: Payer: Self-pay | Admitting: *Deleted

## 2013-05-27 MED ORDER — ARFORMOTEROL TARTRATE 15 MCG/2ML IN NEBU: 2.0000 mL | INHALATION_SOLUTION | Freq: Two times a day (BID) | RESPIRATORY_TRACT | Status: DC

## 2013-05-30 ENCOUNTER — Encounter: Payer: Self-pay | Admitting: Internal Medicine

## 2013-06-03 ENCOUNTER — Other Ambulatory Visit (INDEPENDENT_AMBULATORY_CARE_PROVIDER_SITE_OTHER): Payer: Medicare Other

## 2013-06-03 ENCOUNTER — Encounter: Payer: Self-pay | Admitting: Internal Medicine

## 2013-06-03 ENCOUNTER — Ambulatory Visit (INDEPENDENT_AMBULATORY_CARE_PROVIDER_SITE_OTHER): Payer: Medicare Other | Admitting: Internal Medicine

## 2013-06-03 VITALS — BP 130/60 | HR 76 | Ht 62.0 in | Wt 131.0 lb

## 2013-06-03 DIAGNOSIS — K5909 Other constipation: Secondary | ICD-10-CM

## 2013-06-03 DIAGNOSIS — R6889 Other general symptoms and signs: Secondary | ICD-10-CM | POA: Diagnosis not present

## 2013-06-03 DIAGNOSIS — K3184 Gastroparesis: Secondary | ICD-10-CM | POA: Diagnosis not present

## 2013-06-03 DIAGNOSIS — G6181 Chronic inflammatory demyelinating polyneuritis: Secondary | ICD-10-CM | POA: Diagnosis not present

## 2013-06-03 DIAGNOSIS — K5900 Constipation, unspecified: Secondary | ICD-10-CM

## 2013-06-03 DIAGNOSIS — R159 Full incontinence of feces: Secondary | ICD-10-CM | POA: Diagnosis not present

## 2013-06-03 DIAGNOSIS — J449 Chronic obstructive pulmonary disease, unspecified: Secondary | ICD-10-CM | POA: Diagnosis not present

## 2013-06-03 DIAGNOSIS — I10 Essential (primary) hypertension: Secondary | ICD-10-CM | POA: Diagnosis not present

## 2013-06-03 DIAGNOSIS — D126 Benign neoplasm of colon, unspecified: Secondary | ICD-10-CM

## 2013-06-03 DIAGNOSIS — D649 Anemia, unspecified: Secondary | ICD-10-CM

## 2013-06-03 LAB — TSH: TSH: 0.95 u[IU]/mL (ref 0.35–5.50)

## 2013-06-03 NOTE — Progress Notes (Signed)
Subjective:    Patient ID: [name], female    DOB: 1933/05/07, 80 y.o.   MRN: [id number]  HPI Mrs. Prieur is a 80 yo female with complicated PMH of CIDP, COPD, anemia, GERD, hypertension, hyperlipidemia, rectocele, adenomatous colon polyps status post right hemicolectomy for large sessile serrated adenoma, and gastroparesis who seen for followup. She is here today with her husband. Since last being seen here she had cervical spine surgery at [facility] and has had dramatic improvement in some of her neuropathy symptoms. She has recently completed physical therapy and is feeling much better. She still has trouble with her gait at times instability, but overall things are much better for her. She reports dramatic improvement in gastroparesis and she is off of metoclopramide entirely. She "very seldom" has nausea. When she does have nausea she can have vomiting. Vomiting is rare and nonbloody and nonbilious when it occurs. Still with urinary and fecal incontinence, but denies constipation. She continues with MiraLax every other day and she is having a bowel movement once daily to every other day. If she misses MiraLax or becomes constipated she developed left lower quadrant discomfort. She denies heartburn, dysphagia or odynophagia. She continues on Dexilant would like to try to discontinue this.  Review of Systems As per history of present illness, otherwise negative  Current Medications, Allergies, Past Medical History, Past Surgical History, Family History and Social History were reviewed in [employer] record.     Objective:   Physical Exam BP 130/60  Pulse 76  Ht 5\' 2"  (1.575 m)  Wt 131 lb (59.421 kg)  BMI 23.95 kg/m2 Constitutional: Well-developed and well-nourished. No distress. HEENT: Normocephalic and atraumatic. Oropharynx is clear and moist. No oropharyngeal exudate. Conjunctivae are normal.  No scleral icterus. Neck: Neck supple. Trachea  midline. Cardiovascular: Normal rate, regular rhythm and intact distal pulses. No M/R/G Pulmonary/chest: Effort normal and breath sounds normal. No wheezing, rales or rhonchi. Abdominal: Soft, nontender, nondistended. Bowel sounds active throughout. There are no masses palpable. No hepatosplenomegaly. Extremities: no clubbing, cyanosis, or edema Lymphadenopathy: No cervical adenopathy noted. Neurological: Alert and oriented to person place and time. Skin: Skin is warm and dry. No rashes noted. Psychiatric: Normal mood and affect. Behavior is normal.  CBC    Component Value Date/Time   WBC 6.0 05/01/2013 1527   WBC 7.5 12/06/2012 1909   RBC 4.68 01/30/2013 0956   RBC 4.73 12/06/2012 1909   HGB 13.9 05/01/2013 1527   HGB 14.0 12/06/2012 1909   HCT 40.9 05/01/2013 1527   HCT 41.2 12/06/2012 1909   PLT 214 05/01/2013 1527   PLT 236 12/06/2012 1909   MCV 89 05/01/2013 1527   MCV 87.1 12/06/2012 1909   MCH 30.2 05/01/2013 1527   MCH 29.6 12/06/2012 1909   MCHC 34.0 05/01/2013 1527   MCHC 34.0 12/06/2012 1909   RDW 12.8 05/01/2013 1527   RDW 13.1 12/06/2012 1909   LYMPHSABS 2.6 05/01/2013 1527   LYMPHSABS 1.9 12/06/2012 1909   MONOABS 0.6 12/06/2012 1909   EOSABS 0.2 05/01/2013 1527   EOSABS 0.3 12/06/2012 1909   BASOSABS 0.0 05/01/2013 1527   BASOSABS 0.0 12/06/2012 1909    CMP     Component Value Date/Time   NA 140 01/30/2013 0956   K 4.7 01/30/2013 0956   CL 106 01/30/2013 0956   CO2 28 01/30/2013 0956   GLUCOSE 73 01/30/2013 0956   BUN 17 01/30/2013 0956   CREATININE 0.54 01/30/2013 0956   CREATININE  0.78 06/27/2012 1139   CALCIUM 8.5 01/30/2013 0956   PROT 6.8 01/30/2013 0956   ALBUMIN 4.1 01/30/2013 0956   AST 27 01/30/2013 0956   ALT 17 01/30/2013 0956   ALKPHOS 92 01/30/2013 0956   BILITOT 0.3 01/30/2013 0956   GFRNONAA 85* 12/06/2012 1909   GFRAA >90 12/06/2012 1909    Iron/TIBC/Ferritin    Component Value Date/Time   IRON 96 05/01/2013 1527   IRON 63 09/25/2012 1424   TIBC 286 05/01/2013 1527   TIBC 296  09/25/2012 1424   FERRITIN 132 05/01/2013 1527   FERRITIN 81 09/25/2012 1424      Assessment & Plan:   80 yo female with complicated PMH of CIDP, COPD, anemia, GERD, hypertension, hyperlipidemia, rectocele, adenomatous colon polyps status post right hemicolectomy for large sessile serrated adenoma, and gastroparesis who seen for followup.   1.  Gastroparesis/nausea -- considerably improved. She is not using Reglan or any other promotility agent at present. She would like to wean off PPI and this is reasonable. I've instructed her to use Dexilant every other day for a week and then stop. If she develops recurrent heartburn, nausea but asked that she notify me. We discussed rebound which can occur when weaning off PPIs and if this occurs she can use Zantac or Pepcid over-the-counter per box obstruction. She will continue gastroparesis diet.   2.  History of adenomatous colon polyp  -- large adenoma removed by right hemicolectomy in April 2013 after colonoscopy January 2013.  This was not a cancerous polyp and therefore current guidelines support repeat screening in 3 years which would be January 2016.  Cologuard would be an excellent test for her because it would not require prepping and may prevent the need for another colonoscopy. However this test is not currently being covered for surveillance. This may change by the time she is due repeat surveillance. We will discuss this at followup   3.  Hypothyroidism  -- she requested that I check TSH today and I will do so.   4.  Constipation/fecal incontinence  -- long-standing responding well to MiraLax. She will continue MiraLax every other day.   5.  Iron deficiency anemia  -- being followed by hematology and there are no plans for repeat iron infusion at this time. Dr. Marin Olp continues to follow her hemoglobin and iron studies closely. Hemoglobin was most recently normal in December 2014    she will return in about 8 months, sooner if necessary

## 2013-06-03 NOTE — Patient Instructions (Signed)
Your physician has requested that you go to the basement for the following lab work before leaving today: TSH  Start taking Dexilant every other day until no longer needed.  Follow up with Dr. Hilarie Fredrickson in office in 8 months

## 2013-06-05 ENCOUNTER — Encounter: Payer: Self-pay | Admitting: Internal Medicine

## 2013-06-06 DIAGNOSIS — L299 Pruritus, unspecified: Secondary | ICD-10-CM | POA: Diagnosis not present

## 2013-06-06 DIAGNOSIS — L539 Erythematous condition, unspecified: Secondary | ICD-10-CM | POA: Diagnosis not present

## 2013-06-09 DIAGNOSIS — M720 Palmar fascial fibromatosis [Dupuytren]: Secondary | ICD-10-CM | POA: Diagnosis not present

## 2013-06-10 DIAGNOSIS — H6120 Impacted cerumen, unspecified ear: Secondary | ICD-10-CM | POA: Diagnosis not present

## 2013-06-10 DIAGNOSIS — J322 Chronic ethmoidal sinusitis: Secondary | ICD-10-CM | POA: Diagnosis not present

## 2013-06-16 ENCOUNTER — Other Ambulatory Visit: Payer: Self-pay | Admitting: Internal Medicine

## 2013-06-16 ENCOUNTER — Encounter: Payer: Self-pay | Admitting: Internal Medicine

## 2013-06-16 DIAGNOSIS — G6181 Chronic inflammatory demyelinating polyneuritis: Secondary | ICD-10-CM

## 2013-06-16 MED ORDER — AMPHETAMINE-DEXTROAMPHETAMINE 10 MG PO TABS: 10.0000 mg | ORAL_TABLET | Freq: Four times a day (QID) | ORAL | Status: DC

## 2013-06-17 ENCOUNTER — Ambulatory Visit: Payer: Medicare Other | Admitting: Occupational Therapy

## 2013-06-17 ENCOUNTER — Ambulatory Visit: Payer: Medicare Other | Attending: Physician Assistant | Admitting: Rehabilitative and Restorative Service Providers"

## 2013-06-17 DIAGNOSIS — I10 Essential (primary) hypertension: Secondary | ICD-10-CM | POA: Insufficient documentation

## 2013-06-17 DIAGNOSIS — M2560 Stiffness of unspecified joint, not elsewhere classified: Secondary | ICD-10-CM | POA: Diagnosis not present

## 2013-06-17 DIAGNOSIS — J45909 Unspecified asthma, uncomplicated: Secondary | ICD-10-CM | POA: Insufficient documentation

## 2013-06-17 DIAGNOSIS — E039 Hypothyroidism, unspecified: Secondary | ICD-10-CM | POA: Insufficient documentation

## 2013-06-17 DIAGNOSIS — M6281 Muscle weakness (generalized): Secondary | ICD-10-CM | POA: Insufficient documentation

## 2013-06-17 DIAGNOSIS — G609 Hereditary and idiopathic neuropathy, unspecified: Secondary | ICD-10-CM | POA: Diagnosis not present

## 2013-06-17 DIAGNOSIS — M810 Age-related osteoporosis without current pathological fracture: Secondary | ICD-10-CM | POA: Insufficient documentation

## 2013-06-17 DIAGNOSIS — R279 Unspecified lack of coordination: Secondary | ICD-10-CM | POA: Diagnosis not present

## 2013-06-17 DIAGNOSIS — M545 Low back pain, unspecified: Secondary | ICD-10-CM | POA: Insufficient documentation

## 2013-06-17 DIAGNOSIS — M542 Cervicalgia: Secondary | ICD-10-CM | POA: Insufficient documentation

## 2013-06-17 DIAGNOSIS — Z5189 Encounter for other specified aftercare: Secondary | ICD-10-CM | POA: Diagnosis not present

## 2013-06-17 DIAGNOSIS — R269 Unspecified abnormalities of gait and mobility: Secondary | ICD-10-CM | POA: Diagnosis not present

## 2013-06-19 ENCOUNTER — Other Ambulatory Visit: Payer: Self-pay | Admitting: Gastroenterology

## 2013-06-19 DIAGNOSIS — R945 Abnormal results of liver function studies: Secondary | ICD-10-CM

## 2013-06-19 DIAGNOSIS — R7989 Other specified abnormal findings of blood chemistry: Secondary | ICD-10-CM

## 2013-06-19 DIAGNOSIS — R110 Nausea: Secondary | ICD-10-CM

## 2013-06-19 MED ORDER — ONDANSETRON 4 MG PO TBDP
4.0000 mg | ORAL_TABLET | Freq: Three times a day (TID) | ORAL | Status: DC
Start: 2013-06-19 — End: 2013-12-11

## 2013-06-22 ENCOUNTER — Encounter: Payer: Self-pay | Admitting: Internal Medicine

## 2013-06-24 ENCOUNTER — Telehealth: Payer: Self-pay

## 2013-06-24 NOTE — Telephone Encounter (Signed)
UDS:  04/21/2013/reported 06/16/2013 Positive for Adderall Low risk per Dr Larose Kells

## 2013-06-26 ENCOUNTER — Ambulatory Visit: Payer: Medicare Other | Admitting: Physical Therapy

## 2013-06-26 ENCOUNTER — Ambulatory Visit: Payer: Medicare Other | Admitting: Occupational Therapy

## 2013-06-26 DIAGNOSIS — G609 Hereditary and idiopathic neuropathy, unspecified: Secondary | ICD-10-CM | POA: Diagnosis not present

## 2013-06-26 DIAGNOSIS — M545 Low back pain, unspecified: Secondary | ICD-10-CM | POA: Diagnosis not present

## 2013-06-26 DIAGNOSIS — I10 Essential (primary) hypertension: Secondary | ICD-10-CM | POA: Diagnosis not present

## 2013-06-26 DIAGNOSIS — M542 Cervicalgia: Secondary | ICD-10-CM | POA: Diagnosis not present

## 2013-06-26 DIAGNOSIS — R269 Unspecified abnormalities of gait and mobility: Secondary | ICD-10-CM | POA: Diagnosis not present

## 2013-06-26 DIAGNOSIS — Z5189 Encounter for other specified aftercare: Secondary | ICD-10-CM | POA: Diagnosis not present

## 2013-06-27 DIAGNOSIS — L539 Erythematous condition, unspecified: Secondary | ICD-10-CM | POA: Diagnosis not present

## 2013-06-30 ENCOUNTER — Ambulatory Visit: Payer: Medicare Other | Attending: Physician Assistant | Admitting: Physical Therapy

## 2013-06-30 ENCOUNTER — Ambulatory Visit: Payer: Medicare Other | Admitting: Occupational Therapy

## 2013-06-30 ENCOUNTER — Encounter: Payer: Self-pay | Admitting: Internal Medicine

## 2013-06-30 DIAGNOSIS — R279 Unspecified lack of coordination: Secondary | ICD-10-CM | POA: Diagnosis not present

## 2013-06-30 DIAGNOSIS — M545 Low back pain, unspecified: Secondary | ICD-10-CM | POA: Diagnosis not present

## 2013-06-30 DIAGNOSIS — Z5189 Encounter for other specified aftercare: Secondary | ICD-10-CM | POA: Diagnosis not present

## 2013-06-30 DIAGNOSIS — G609 Hereditary and idiopathic neuropathy, unspecified: Secondary | ICD-10-CM | POA: Insufficient documentation

## 2013-06-30 DIAGNOSIS — M542 Cervicalgia: Secondary | ICD-10-CM | POA: Insufficient documentation

## 2013-06-30 DIAGNOSIS — M2560 Stiffness of unspecified joint, not elsewhere classified: Secondary | ICD-10-CM | POA: Insufficient documentation

## 2013-06-30 DIAGNOSIS — M6281 Muscle weakness (generalized): Secondary | ICD-10-CM | POA: Diagnosis not present

## 2013-06-30 DIAGNOSIS — R269 Unspecified abnormalities of gait and mobility: Secondary | ICD-10-CM | POA: Diagnosis not present

## 2013-07-01 ENCOUNTER — Telehealth: Payer: Self-pay | Admitting: Gastroenterology

## 2013-07-01 NOTE — Telephone Encounter (Signed)
Started Prior authorization for Zofran ODT 4 mg faxed form to [phone]

## 2013-07-03 ENCOUNTER — Ambulatory Visit: Payer: Medicare Other | Admitting: Rehabilitative and Restorative Service Providers"

## 2013-07-03 ENCOUNTER — Ambulatory Visit: Payer: Medicare Other | Admitting: Occupational Therapy

## 2013-07-03 DIAGNOSIS — M545 Low back pain, unspecified: Secondary | ICD-10-CM | POA: Diagnosis not present

## 2013-07-03 DIAGNOSIS — M542 Cervicalgia: Secondary | ICD-10-CM | POA: Diagnosis not present

## 2013-07-03 DIAGNOSIS — R269 Unspecified abnormalities of gait and mobility: Secondary | ICD-10-CM | POA: Diagnosis not present

## 2013-07-03 DIAGNOSIS — G609 Hereditary and idiopathic neuropathy, unspecified: Secondary | ICD-10-CM | POA: Diagnosis not present

## 2013-07-03 DIAGNOSIS — R279 Unspecified lack of coordination: Secondary | ICD-10-CM | POA: Diagnosis not present

## 2013-07-03 DIAGNOSIS — Z5189 Encounter for other specified aftercare: Secondary | ICD-10-CM | POA: Diagnosis not present

## 2013-07-07 ENCOUNTER — Ambulatory Visit: Payer: Medicare Other | Admitting: *Deleted

## 2013-07-07 ENCOUNTER — Ambulatory Visit: Payer: Medicare Other | Admitting: Rehabilitative and Restorative Service Providers"

## 2013-07-07 DIAGNOSIS — Z5189 Encounter for other specified aftercare: Secondary | ICD-10-CM | POA: Diagnosis not present

## 2013-07-07 DIAGNOSIS — M545 Low back pain, unspecified: Secondary | ICD-10-CM | POA: Diagnosis not present

## 2013-07-07 DIAGNOSIS — G609 Hereditary and idiopathic neuropathy, unspecified: Secondary | ICD-10-CM | POA: Diagnosis not present

## 2013-07-07 DIAGNOSIS — M542 Cervicalgia: Secondary | ICD-10-CM | POA: Diagnosis not present

## 2013-07-07 DIAGNOSIS — R279 Unspecified lack of coordination: Secondary | ICD-10-CM | POA: Diagnosis not present

## 2013-07-07 DIAGNOSIS — R269 Unspecified abnormalities of gait and mobility: Secondary | ICD-10-CM | POA: Diagnosis not present

## 2013-07-10 ENCOUNTER — Telehealth: Payer: Self-pay | Admitting: *Deleted

## 2013-07-10 NOTE — Telephone Encounter (Signed)
Pt notified of the reverification / cost  for Prolia injection. Pt agrees and wants to proceed.

## 2013-07-11 ENCOUNTER — Ambulatory Visit: Payer: Medicare Other | Admitting: Physical Therapy

## 2013-07-11 ENCOUNTER — Ambulatory Visit: Payer: Medicare Other | Admitting: Occupational Therapy

## 2013-07-11 DIAGNOSIS — G609 Hereditary and idiopathic neuropathy, unspecified: Secondary | ICD-10-CM | POA: Diagnosis not present

## 2013-07-11 DIAGNOSIS — M542 Cervicalgia: Secondary | ICD-10-CM | POA: Diagnosis not present

## 2013-07-11 DIAGNOSIS — R269 Unspecified abnormalities of gait and mobility: Secondary | ICD-10-CM | POA: Diagnosis not present

## 2013-07-11 DIAGNOSIS — M545 Low back pain, unspecified: Secondary | ICD-10-CM | POA: Diagnosis not present

## 2013-07-11 DIAGNOSIS — R279 Unspecified lack of coordination: Secondary | ICD-10-CM | POA: Diagnosis not present

## 2013-07-11 DIAGNOSIS — Z5189 Encounter for other specified aftercare: Secondary | ICD-10-CM | POA: Diagnosis not present

## 2013-07-14 ENCOUNTER — Encounter: Payer: Self-pay | Admitting: Internal Medicine

## 2013-07-14 ENCOUNTER — Ambulatory Visit: Payer: Medicare Other | Admitting: Occupational Therapy

## 2013-07-14 ENCOUNTER — Ambulatory Visit: Payer: Medicare Other | Admitting: Physical Therapy

## 2013-07-14 DIAGNOSIS — R279 Unspecified lack of coordination: Secondary | ICD-10-CM | POA: Diagnosis not present

## 2013-07-14 DIAGNOSIS — M545 Low back pain, unspecified: Secondary | ICD-10-CM | POA: Diagnosis not present

## 2013-07-14 DIAGNOSIS — R269 Unspecified abnormalities of gait and mobility: Secondary | ICD-10-CM | POA: Diagnosis not present

## 2013-07-14 DIAGNOSIS — Z5189 Encounter for other specified aftercare: Secondary | ICD-10-CM | POA: Diagnosis not present

## 2013-07-14 DIAGNOSIS — M542 Cervicalgia: Secondary | ICD-10-CM | POA: Diagnosis not present

## 2013-07-14 DIAGNOSIS — G609 Hereditary and idiopathic neuropathy, unspecified: Secondary | ICD-10-CM | POA: Diagnosis not present

## 2013-07-15 ENCOUNTER — Telehealth: Payer: Self-pay | Admitting: Internal Medicine

## 2013-07-15 NOTE — Telephone Encounter (Signed)
More than 1000 pages of records reviewed today, many duplicate, already scanned or irrelevant ; only few will be scan, the rest will be sent to the patient for safe keeping.

## 2013-07-16 ENCOUNTER — Other Ambulatory Visit: Payer: Self-pay | Admitting: Internal Medicine

## 2013-07-16 DIAGNOSIS — G6181 Chronic inflammatory demyelinating polyneuritis: Secondary | ICD-10-CM

## 2013-07-16 MED ORDER — AMPHETAMINE-DEXTROAMPHETAMINE 10 MG PO TABS: 10.0000 mg | ORAL_TABLET | Freq: Four times a day (QID) | ORAL | Status: DC

## 2013-07-17 ENCOUNTER — Ambulatory Visit: Payer: Medicare Other | Admitting: Occupational Therapy

## 2013-07-17 ENCOUNTER — Ambulatory Visit: Payer: Medicare Other | Admitting: Physical Therapy

## 2013-07-17 ENCOUNTER — Encounter: Payer: Medicare Other | Admitting: Occupational Therapy

## 2013-07-17 DIAGNOSIS — Z5189 Encounter for other specified aftercare: Secondary | ICD-10-CM | POA: Diagnosis not present

## 2013-07-17 DIAGNOSIS — M545 Low back pain, unspecified: Secondary | ICD-10-CM | POA: Diagnosis not present

## 2013-07-17 DIAGNOSIS — M542 Cervicalgia: Secondary | ICD-10-CM | POA: Diagnosis not present

## 2013-07-17 DIAGNOSIS — G609 Hereditary and idiopathic neuropathy, unspecified: Secondary | ICD-10-CM | POA: Diagnosis not present

## 2013-07-17 DIAGNOSIS — R269 Unspecified abnormalities of gait and mobility: Secondary | ICD-10-CM | POA: Diagnosis not present

## 2013-07-17 DIAGNOSIS — R279 Unspecified lack of coordination: Secondary | ICD-10-CM | POA: Diagnosis not present

## 2013-07-21 ENCOUNTER — Telehealth: Payer: Self-pay | Admitting: *Deleted

## 2013-07-21 NOTE — Telephone Encounter (Addendum)
Print a letter:  To Whom It May Concern  Mrs. [name] is a patient of mine with history of chronic fatigue syndrome and hypersomnia currently taking adderrall with excellent records. In the past the patient reports she has been unable to tolerate other medications including Ritalin or Nuvigil. She has been taking Adderrall for the longest time, I think is medically necessary that she continue to do that as it is the medication that works the best for her.

## 2013-07-21 NOTE — Telephone Encounter (Signed)
Pts insurance will not cover adderall. Pt / insurance requesting a statement from PCP stating that it is medically necessary for her to stay on adderall to treat her condition and none of the other medication options are as effective or would have adverse effects.  Pt states in her mychart message -Previously have taken Ritalin, Nuvigil, etc. and nothing has helped, all under the care of both Dr. Judi Cong and Charlett Blake. This info was in my medical records. Please advise .

## 2013-07-22 ENCOUNTER — Ambulatory Visit: Payer: Medicare Other | Admitting: Rehabilitative and Restorative Service Providers"

## 2013-07-22 ENCOUNTER — Encounter: Payer: Self-pay | Admitting: Internal Medicine

## 2013-07-22 DIAGNOSIS — R269 Unspecified abnormalities of gait and mobility: Secondary | ICD-10-CM | POA: Diagnosis not present

## 2013-07-22 DIAGNOSIS — M545 Low back pain, unspecified: Secondary | ICD-10-CM | POA: Diagnosis not present

## 2013-07-22 DIAGNOSIS — M542 Cervicalgia: Secondary | ICD-10-CM | POA: Diagnosis not present

## 2013-07-22 DIAGNOSIS — G609 Hereditary and idiopathic neuropathy, unspecified: Secondary | ICD-10-CM | POA: Diagnosis not present

## 2013-07-22 DIAGNOSIS — Z5189 Encounter for other specified aftercare: Secondary | ICD-10-CM | POA: Diagnosis not present

## 2013-07-22 DIAGNOSIS — R279 Unspecified lack of coordination: Secondary | ICD-10-CM | POA: Diagnosis not present

## 2013-07-22 NOTE — Telephone Encounter (Signed)
Exception letter has been printed and given to [name]. who will call the insurance company and get an exception form to complete. The patient has been made aware that we are working on it.      KP

## 2013-07-23 ENCOUNTER — Other Ambulatory Visit: Payer: Self-pay | Admitting: *Deleted

## 2013-07-23 MED ORDER — PHENOBARBITAL 32.4 MG PO TABS: 32.4000 mg | ORAL_TABLET | Freq: Three times a day (TID) | ORAL | Status: DC

## 2013-07-24 ENCOUNTER — Ambulatory Visit: Payer: Medicare Other | Admitting: Physical Therapy

## 2013-07-24 ENCOUNTER — Encounter: Payer: Self-pay | Admitting: Internal Medicine

## 2013-07-25 DIAGNOSIS — M19049 Primary osteoarthritis, unspecified hand: Secondary | ICD-10-CM | POA: Diagnosis not present

## 2013-07-25 MED ORDER — PHENOBARBITAL 32.4 MG PO TABS: 32.4000 mg | ORAL_TABLET | Freq: Three times a day (TID) | ORAL | Status: DC

## 2013-07-25 NOTE — Addendum Note (Signed)
Addended by: Peggyann Shoals on: 07/25/2013 02:22 PM   Modules accepted: Orders

## 2013-07-27 ENCOUNTER — Encounter: Payer: Self-pay | Admitting: Internal Medicine

## 2013-07-28 ENCOUNTER — Ambulatory Visit: Payer: Medicare Other | Attending: Physician Assistant | Admitting: Rehabilitative and Restorative Service Providers"

## 2013-07-28 DIAGNOSIS — R279 Unspecified lack of coordination: Secondary | ICD-10-CM | POA: Insufficient documentation

## 2013-07-28 DIAGNOSIS — M545 Low back pain, unspecified: Secondary | ICD-10-CM | POA: Insufficient documentation

## 2013-07-28 DIAGNOSIS — G609 Hereditary and idiopathic neuropathy, unspecified: Secondary | ICD-10-CM | POA: Insufficient documentation

## 2013-07-28 DIAGNOSIS — M2560 Stiffness of unspecified joint, not elsewhere classified: Secondary | ICD-10-CM | POA: Insufficient documentation

## 2013-07-28 DIAGNOSIS — M6281 Muscle weakness (generalized): Secondary | ICD-10-CM | POA: Insufficient documentation

## 2013-07-28 DIAGNOSIS — Z5189 Encounter for other specified aftercare: Secondary | ICD-10-CM | POA: Insufficient documentation

## 2013-07-28 DIAGNOSIS — M542 Cervicalgia: Secondary | ICD-10-CM | POA: Insufficient documentation

## 2013-07-28 DIAGNOSIS — R269 Unspecified abnormalities of gait and mobility: Secondary | ICD-10-CM | POA: Diagnosis not present

## 2013-07-31 ENCOUNTER — Telehealth: Payer: Self-pay | Admitting: *Deleted

## 2013-07-31 ENCOUNTER — Ambulatory Visit: Payer: Medicare Other | Admitting: Physical Therapy

## 2013-07-31 NOTE — Telephone Encounter (Signed)
Prior authorization for amphetamine salts approved from 07/29/2013-07/30/2014. JG//CMA

## 2013-08-01 ENCOUNTER — Encounter: Payer: Self-pay | Admitting: Internal Medicine

## 2013-08-01 DIAGNOSIS — L539 Erythematous condition, unspecified: Secondary | ICD-10-CM | POA: Diagnosis not present

## 2013-08-04 ENCOUNTER — Ambulatory Visit: Payer: Medicare Other | Admitting: Physical Therapy

## 2013-08-08 DIAGNOSIS — N951 Menopausal and female climacteric states: Secondary | ICD-10-CM | POA: Diagnosis not present

## 2013-08-08 DIAGNOSIS — N816 Rectocele: Secondary | ICD-10-CM | POA: Diagnosis not present

## 2013-08-14 DIAGNOSIS — M542 Cervicalgia: Secondary | ICD-10-CM | POA: Diagnosis not present

## 2013-08-20 ENCOUNTER — Encounter: Payer: Self-pay | Admitting: Internal Medicine

## 2013-08-22 ENCOUNTER — Telehealth: Payer: Self-pay | Admitting: Hematology & Oncology

## 2013-08-22 NOTE — Telephone Encounter (Signed)
Left message cx 4-3 and to call and reschedule

## 2013-08-27 ENCOUNTER — Telehealth: Payer: Self-pay | Admitting: Hematology & Oncology

## 2013-08-27 NOTE — Telephone Encounter (Signed)
Pt moved 4-1 to 5-15

## 2013-08-28 ENCOUNTER — Encounter: Payer: Self-pay | Admitting: Internal Medicine

## 2013-08-29 ENCOUNTER — Other Ambulatory Visit: Payer: Medicare Other | Admitting: Lab

## 2013-08-29 ENCOUNTER — Ambulatory Visit: Payer: Medicare Other | Admitting: Hematology & Oncology

## 2013-09-01 ENCOUNTER — Telehealth: Payer: Self-pay | Admitting: *Deleted

## 2013-09-01 ENCOUNTER — Encounter: Payer: Self-pay | Admitting: Internal Medicine

## 2013-09-01 DIAGNOSIS — G6181 Chronic inflammatory demyelinating polyneuritis: Secondary | ICD-10-CM

## 2013-09-01 MED ORDER — AMPHETAMINE-DEXTROAMPHETAMINE 10 MG PO TABS: 10.0000 mg | ORAL_TABLET | Freq: Four times a day (QID) | ORAL | Status: DC

## 2013-09-01 NOTE — Telephone Encounter (Signed)
Pt requesting generic adderall - 120 amphetamine salt com 10 mg. Tabs Last OV- 05/01/13 Last refilled- 07/16/13 #120 / 0 rf  UDS- 04/18/13 LOW risk.

## 2013-09-01 NOTE — Telephone Encounter (Signed)
done

## 2013-09-01 NOTE — Telephone Encounter (Signed)
Pt notified. rx rdy for pick up at front desk.  

## 2013-09-02 ENCOUNTER — Other Ambulatory Visit: Payer: Self-pay | Admitting: Orthopedic Surgery

## 2013-09-02 ENCOUNTER — Telehealth: Payer: Self-pay | Admitting: *Deleted

## 2013-09-02 ENCOUNTER — Encounter: Payer: Self-pay | Admitting: Internal Medicine

## 2013-09-02 NOTE — Telephone Encounter (Signed)
[employer] reverify faxed.

## 2013-09-03 ENCOUNTER — Encounter (HOSPITAL_BASED_OUTPATIENT_CLINIC_OR_DEPARTMENT_OTHER): Payer: Self-pay | Admitting: *Deleted

## 2013-09-03 ENCOUNTER — Encounter (HOSPITAL_BASED_OUTPATIENT_CLINIC_OR_DEPARTMENT_OTHER)
Admission: RE | Admit: 2013-09-03 | Discharge: 2013-09-03 | Disposition: A | Discharge status: Home / Self Care | Payer: Medicare Other | Source: Ambulatory Visit | Attending: Orthopedic Surgery | Admitting: Orthopedic Surgery

## 2013-09-03 DIAGNOSIS — Z01812 Encounter for preprocedural laboratory examination: Secondary | ICD-10-CM | POA: Insufficient documentation

## 2013-09-03 LAB — BASIC METABOLIC PANEL
BUN: 23 mg/dL (ref 6–23)
CO2: 24 mEq/L (ref 19–32)
Calcium: 9.4 mg/dL (ref 8.4–10.5)
Chloride: 105 mEq/L (ref 96–112)
Creatinine, Ser: 0.73 mg/dL (ref 0.50–1.10)
GFR calc Af Amer: >90 mL/min (ref >90)
GFR calc non Af Amer: 79 mL/min — ABNORMAL LOW (ref >90)
Glucose, Bld: 87 mg/dL (ref 70–99)
Potassium: 4 mEq/L (ref 3.7–5.3)
Sodium: 143 mEq/L (ref 137–147)

## 2013-09-03 NOTE — Progress Notes (Signed)
Pt has multiple health problems mostly pulm and arthroitus-just had cerv fusion [facility] 8/14-came for bmet- Has had several hand surgeries here-

## 2013-09-05 ENCOUNTER — Telehealth: Payer: Self-pay | Admitting: Hematology & Oncology

## 2013-09-05 NOTE — Telephone Encounter (Signed)
Pt moved 5-15 to 6-1 she will be out of town

## 2013-09-06 ENCOUNTER — Encounter: Payer: Self-pay | Admitting: Internal Medicine

## 2013-09-08 NOTE — H&P (Signed)
[name] is an 80 y.o. female.   Chief Complaint: c/o chronic and progressive pain left index finger PIP joint HPI: [name] returns with her husband to discuss her ongoing issues with osteoarthritis. She is having severe pain in her right ring finger PIP joint and her left index finger PIP joint.  She has responded well to occasional steroid injections.  She is currently under the evaluation of a neurologist at [facility].  She has a peripheral neuropathy.  She will seek a neurosurgical consult.  She has a significant smooth muscle disorder and a peripheral neuropathy that is under evaluation.    Past Medical History  Diagnosis Date  . Anemia   . Anxiety -depression     patient denied  . Asthma -COPD   . Chronic fatigue syndrome   . GERD (gastroesophageal reflux disease)   . Hyperlipidemia   . Hypertension   . Colon polyp   . Allergic rhinitis   . Vitamin D deficiency   . Pulmonary embolism     1980s  . Neuropathy   . Arthritis   . 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 Blood transfusion   . Osteoporosis   . Hypothyroid   . Constipation   . Family history of anesthesia complication     Daughter with history of malignant hyperthermia  . Seizures     1990 last seizures on meds Phenobarb  . Skin cancer     squamous cell  . Neurogenic bladder 2013    husband caths pt  . chronic sinusitis 08/08/2012  . Shingles late 35's  . Cystocele 09/16/2012  . Tubular adenoma     Past Surgical History  Procedure Laterality Date  . Tonsillectomy and adenoidectomy    . Nasal septum surgery    . Dilation and curettage of uterus    . Tubal ligation    . Cesarean section    . Sinus surgeries      x 4  . Left ovary and tube removed    . Vocal polyps removed    . Appendectomy    . Cystocele repair    . Carpal tunnel release      right  . Shoulder arthroscopy      x2 left, 1 right  . Left finger fusion    . Right median nerve  decompression    . Duptyren's contracture right hand    . Finger ganglion cyst excision      right  . Abdominal hysterectomy    . Joint replacement      right and left basal joints of thumbs  . Bladder suspension    . Cataract extraction      bilateral  . Cervical neck ablation      x 7, C3-C4  . Squamous lesions removed      neck and face  . Basal cell carcinoma excision      face  . Panniculectomy    . Nasal polyp surgery      4  . Anterior fusion clivus-c2 extraoral w/ odontoid excision  8/14    Family History  Problem Relation Age of Onset  . Heart disease Mother   . Hyperlipidemia Mother     Jerilynn Mages, aunt  . Allergies Sister   . Ovarian cancer Sister   . Lung cancer Sister     lung - stage 1  . Thyroid cancer Sister   . Colon cancer Father   . Stomach cancer Other  first cousin  . Skin cancer Daughter   . Malignant hyperthermia Daughter    Social History:  reports that she quit smoking about 35 years ago. Her smoking use included Cigarettes. She has a 1.5 pack-year smoking history. She has never used smokeless tobacco. She reports that she does not drink alcohol or use illicit drugs.  Allergies:  Allergies  Allergen Reactions  . Iodine Other (See Comments)    Cardiac arrest  . Morphine And Related Other (See Comments)    Cardiac arrest    No prescriptions prior to admission    No results found for this or any previous visit (from the past 48 hour(s)).  No results found.   Pertinent items are noted in HPI.  Height 5\' 2"  (1.575 m), weight 58.968 kg (130 lb).  General appearance: alert Head: Normocephalic, without obvious abnormality Neck: supple, trachea midline Resp: clear to auscultation bilaterally Cardio: regular rate and rhythm GI: normal findings: bowel sounds normal Extremities: Her left index finger PIP joint  has a significant ulnar deviation deformity. She also would like to consider being scheduled for arthrodesis of her left index finger  PIP joint. N/V intact distally. 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  Plan: To the OR for fusion of left index PIP joint.The procedure, risks,benefits and post-op course were discussed with the patient at length and they were in agreement with the plan.   Marily Lente Dasnoit 09/08/2013, 2:41 PM  H&P documentation: 09/09/2013  -History and Physical Reviewed  -Patient has been re-examined  -No change in the plan of care  Cammie Sickle, MD

## 2013-09-09 ENCOUNTER — Ambulatory Visit (HOSPITAL_BASED_OUTPATIENT_CLINIC_OR_DEPARTMENT_OTHER): Payer: Medicare Other | Admitting: Anesthesiology

## 2013-09-09 ENCOUNTER — Encounter (HOSPITAL_BASED_OUTPATIENT_CLINIC_OR_DEPARTMENT_OTHER)
Admission: RE | Disposition: A | Discharge status: Home / Self Care | Payer: Self-pay | Source: Ambulatory Visit | Attending: Orthopedic Surgery

## 2013-09-09 ENCOUNTER — Encounter (HOSPITAL_BASED_OUTPATIENT_CLINIC_OR_DEPARTMENT_OTHER): Payer: Medicare Other | Admitting: Anesthesiology

## 2013-09-09 ENCOUNTER — Encounter (HOSPITAL_BASED_OUTPATIENT_CLINIC_OR_DEPARTMENT_OTHER): Payer: Self-pay

## 2013-09-09 ENCOUNTER — Ambulatory Visit (HOSPITAL_BASED_OUTPATIENT_CLINIC_OR_DEPARTMENT_OTHER)
Admission: RE | Admit: 2013-09-09 | Discharge: 2013-09-09 | Disposition: A | Discharge status: Home / Self Care | Payer: Medicare Other | Source: Ambulatory Visit | Attending: Orthopedic Surgery | Admitting: Orthopedic Surgery

## 2013-09-09 DIAGNOSIS — M8569 Other cyst of bone, multiple sites: Secondary | ICD-10-CM | POA: Diagnosis not present

## 2013-09-09 DIAGNOSIS — G6181 Chronic inflammatory demyelinating polyneuritis: Secondary | ICD-10-CM | POA: Diagnosis not present

## 2013-09-09 DIAGNOSIS — E039 Hypothyroidism, unspecified: Secondary | ICD-10-CM | POA: Diagnosis not present

## 2013-09-09 DIAGNOSIS — I10 Essential (primary) hypertension: Secondary | ICD-10-CM | POA: Insufficient documentation

## 2013-09-09 DIAGNOSIS — IMO0002 Reserved for concepts with insufficient information to code with codable children: Secondary | ICD-10-CM | POA: Insufficient documentation

## 2013-09-09 DIAGNOSIS — M810 Age-related osteoporosis without current pathological fracture: Secondary | ICD-10-CM | POA: Insufficient documentation

## 2013-09-09 DIAGNOSIS — Z87891 Personal history of nicotine dependence: Secondary | ICD-10-CM | POA: Insufficient documentation

## 2013-09-09 DIAGNOSIS — D649 Anemia, unspecified: Secondary | ICD-10-CM | POA: Diagnosis not present

## 2013-09-09 DIAGNOSIS — M109 Gout, unspecified: Secondary | ICD-10-CM | POA: Insufficient documentation

## 2013-09-09 DIAGNOSIS — R5381 Other malaise: Secondary | ICD-10-CM | POA: Insufficient documentation

## 2013-09-09 DIAGNOSIS — G8918 Other acute postprocedural pain: Secondary | ICD-10-CM | POA: Diagnosis not present

## 2013-09-09 DIAGNOSIS — J4489 Other specified chronic obstructive pulmonary disease: Secondary | ICD-10-CM | POA: Insufficient documentation

## 2013-09-09 DIAGNOSIS — J449 Chronic obstructive pulmonary disease, unspecified: Secondary | ICD-10-CM | POA: Diagnosis not present

## 2013-09-09 DIAGNOSIS — R5383 Other fatigue: Secondary | ICD-10-CM | POA: Diagnosis not present

## 2013-09-09 DIAGNOSIS — Z85828 Personal history of other malignant neoplasm of skin: Secondary | ICD-10-CM | POA: Insufficient documentation

## 2013-09-09 DIAGNOSIS — Z981 Arthrodesis status: Secondary | ICD-10-CM | POA: Insufficient documentation

## 2013-09-09 DIAGNOSIS — E785 Hyperlipidemia, unspecified: Secondary | ICD-10-CM | POA: Insufficient documentation

## 2013-09-09 DIAGNOSIS — K219 Gastro-esophageal reflux disease without esophagitis: Secondary | ICD-10-CM | POA: Insufficient documentation

## 2013-09-09 DIAGNOSIS — E559 Vitamin D deficiency, unspecified: Secondary | ICD-10-CM | POA: Diagnosis not present

## 2013-09-09 DIAGNOSIS — M11239 Other chondrocalcinosis, unspecified wrist: Secondary | ICD-10-CM | POA: Insufficient documentation

## 2013-09-09 DIAGNOSIS — M659 Synovitis and tenosynovitis, unspecified: Secondary | ICD-10-CM | POA: Diagnosis not present

## 2013-09-09 DIAGNOSIS — M25549 Pain in joints of unspecified hand: Secondary | ICD-10-CM | POA: Diagnosis not present

## 2013-09-09 DIAGNOSIS — M11849 Other specified crystal arthropathies, unspecified hand: Secondary | ICD-10-CM | POA: Diagnosis not present

## 2013-09-09 DIAGNOSIS — M19049 Primary osteoarthritis, unspecified hand: Secondary | ICD-10-CM | POA: Diagnosis not present

## 2013-09-09 HISTORY — PX: PROXIMAL INTERPHALANGEAL FUSION (PIP): SHX6043

## 2013-09-09 LAB — POCT HEMOGLOBIN-HEMACUE: Hemoglobin: 12.5 g/dL (ref 12.0–15.0)

## 2013-09-09 SURGERY — FUSION, PIP JOINT
Anesthesia: Monitor Anesthesia Care | Site: Finger | Laterality: Left

## 2013-09-09 MED ORDER — MIDAZOLAM HCL 5 MG/5ML IJ SOLN
INTRAMUSCULAR | Status: DC | PRN
  Administered 2013-09-09: 2 mg via INTRAVENOUS

## 2013-09-09 MED ORDER — MIDAZOLAM HCL 2 MG/2ML IJ SOLN
INTRAMUSCULAR | Status: AC
  Filled 2013-09-09: qty 2

## 2013-09-09 MED ORDER — FENTANYL CITRATE 0.05 MG/ML IJ SOLN
INTRAMUSCULAR | Status: DC | PRN
  Administered 2013-09-09: 50 ug via INTRAVENOUS

## 2013-09-09 MED ORDER — CEFAZOLIN SODIUM-DEXTROSE 2-3 GM-% IV SOLR: 2.0000 g | INTRAVENOUS | Status: DC

## 2013-09-09 MED ORDER — FENTANYL CITRATE 0.05 MG/ML IJ SOLN
INTRAMUSCULAR | Status: AC
  Filled 2013-09-09: qty 2

## 2013-09-09 MED ORDER — MIDAZOLAM HCL 2 MG/2ML IJ SOLN
1.0000 mg | INTRAMUSCULAR | Status: DC | PRN
  Administered 2013-09-09: 1 mg via INTRAVENOUS

## 2013-09-09 MED ORDER — FENTANYL CITRATE 0.05 MG/ML IJ SOLN
INTRAMUSCULAR | Status: AC
  Filled 2013-09-09: qty 6

## 2013-09-09 MED ORDER — LIDOCAINE HCL 2 % IJ SOLN
INTRAMUSCULAR | Status: AC
  Filled 2013-09-09: qty 20

## 2013-09-09 MED ORDER — CHLORHEXIDINE GLUCONATE 4 % EX LIQD: 60.0000 mL | Freq: Once | CUTANEOUS | Status: DC

## 2013-09-09 MED ORDER — PROPOFOL 10 MG/ML IV BOLUS
INTRAVENOUS | Status: DC | PRN
  Administered 2013-09-09: 20 mg via INTRAVENOUS

## 2013-09-09 MED ORDER — FENTANYL CITRATE 0.05 MG/ML IJ SOLN: 25.0000 ug | INTRAMUSCULAR | Status: DC | PRN

## 2013-09-09 MED ORDER — PROPOFOL INFUSION 10 MG/ML OPTIME
INTRAVENOUS | Status: DC | PRN
  Administered 2013-09-09: 75 ug/kg/min via INTRAVENOUS

## 2013-09-09 MED ORDER — LACTATED RINGERS IV SOLN
INTRAVENOUS | Status: DC
  Administered 2013-09-09 (×2): via INTRAVENOUS

## 2013-09-09 MED ORDER — CEFAZOLIN SODIUM-DEXTROSE 2-3 GM-% IV SOLR
INTRAVENOUS | Status: AC
  Filled 2013-09-09: qty 50

## 2013-09-09 MED ORDER — FENTANYL CITRATE 0.05 MG/ML IJ SOLN
50.0000 ug | INTRAMUSCULAR | Status: DC | PRN
  Administered 2013-09-09: 50 ug via INTRAVENOUS

## 2013-09-09 MED ORDER — ROPIVACAINE HCL 5 MG/ML IJ SOLN
INTRAMUSCULAR | Status: DC | PRN
Start: 2013-09-09 — End: 2013-09-09
  Administered 2013-09-09: 35 mL via PERINEURAL

## 2013-09-09 SURGICAL SUPPLY — 68 items
BANDAGE ADH SHEER 1  50/CT (GAUZE/BANDAGES/DRESSINGS) IMPLANT
BANDAGE ELASTIC 3 VELCRO ST LF (GAUZE/BANDAGES/DRESSINGS) ×2 IMPLANT
BANDAGE ELASTIC 4 VELCRO ST LF (GAUZE/BANDAGES/DRESSINGS) IMPLANT
BLADE MINI RND TIP GREEN BEAV (BLADE) IMPLANT
BLADE SURG 15 STRL LF DISP TIS (BLADE) ×1 IMPLANT
BLADE SURG 15 STRL SS (BLADE) ×2
BNDG CMPR 9X4 STRL LF SNTH (GAUZE/BANDAGES/DRESSINGS) ×1
BNDG CMPR MD 5X2 ELC HKLP STRL (GAUZE/BANDAGES/DRESSINGS) ×1
BNDG COHESIVE 1X5 TAN STRL LF (GAUZE/BANDAGES/DRESSINGS) IMPLANT
BNDG COHESIVE 3X5 TAN STRL LF (GAUZE/BANDAGES/DRESSINGS) IMPLANT
BNDG COHESIVE 4X5 TAN STRL (GAUZE/BANDAGES/DRESSINGS) IMPLANT
BNDG ELASTIC 2 VLCR STRL LF (GAUZE/BANDAGES/DRESSINGS) ×2 IMPLANT
BNDG ESMARK 4X9 LF (GAUZE/BANDAGES/DRESSINGS) ×2 IMPLANT
BNDG GAUZE ELAST 4 BULKY (GAUZE/BANDAGES/DRESSINGS) ×1 IMPLANT
BRUSH SCRUB EZ PLAIN DRY (MISCELLANEOUS) ×2 IMPLANT
BUR EGG 3PK/BX (BURR) ×1 IMPLANT
CANISTER SUCT 1200ML W/VALVE (MISCELLANEOUS) ×1 IMPLANT
CORDS BIPOLAR (ELECTRODE) ×2 IMPLANT
COVER MAYO STAND STRL (DRAPES) ×2 IMPLANT
COVER TABLE BACK 60X90 (DRAPES) ×2 IMPLANT
CUFF TOURNIQUET SINGLE 18IN (TOURNIQUET CUFF) ×1 IMPLANT
DECANTER SPIKE VIAL GLASS SM (MISCELLANEOUS) IMPLANT
DRAPE EXTREMITY T 121X128X90 (DRAPE) ×2 IMPLANT
DRAPE OEC MINIVIEW 54X84 (DRAPES) ×2 IMPLANT
DRAPE SURG 17X23 STRL (DRAPES) ×2 IMPLANT
GLOVE BIOGEL M STRL SZ7.5 (GLOVE) ×2 IMPLANT
GLOVE BIOGEL PI IND STRL 7.0 (GLOVE) ×2 IMPLANT
GLOVE BIOGEL PI INDICATOR 7.0 (GLOVE) ×2
GLOVE ECLIPSE 6.5 STRL STRAW (GLOVE) ×1 IMPLANT
GLOVE ORTHO TXT STRL SZ7.5 (GLOVE) ×2 IMPLANT
GOWN STRL REUS W/ TWL LRG LVL3 (GOWN DISPOSABLE) ×1 IMPLANT
GOWN STRL REUS W/ TWL XL LVL3 (GOWN DISPOSABLE) ×2 IMPLANT
GOWN STRL REUS W/TWL LRG LVL3 (GOWN DISPOSABLE) ×2
GOWN STRL REUS W/TWL XL LVL3 (GOWN DISPOSABLE) ×4
K-WIRE .035X4 (WIRE) ×4 IMPLANT
NEEDLE 27GAX1X1/2 (NEEDLE) IMPLANT
NS IRRIG 1000ML POUR BTL (IV SOLUTION) ×2 IMPLANT
PACK BASIN DAY SURGERY FS (CUSTOM PROCEDURE TRAY) ×2 IMPLANT
PAD CAST 3X4 CTTN HI CHSV (CAST SUPPLIES) ×1 IMPLANT
PAD CAST 4YDX4 CTTN HI CHSV (CAST SUPPLIES) IMPLANT
PADDING CAST ABS 4INX4YD NS (CAST SUPPLIES)
PADDING CAST ABS COTTON 4X4 ST (CAST SUPPLIES) ×1 IMPLANT
PADDING CAST COTTON 3X4 STRL (CAST SUPPLIES) ×2
PADDING CAST COTTON 4X4 STRL (CAST SUPPLIES)
PADDING UNDERCAST 2  STERILE (CAST SUPPLIES) ×1 IMPLANT
SLEEVE SCD COMPRESS KNEE MED (MISCELLANEOUS) ×2 IMPLANT
SLING ARM LRG ADULT FOAM STRAP (SOFTGOODS) ×2 IMPLANT
SPLINT PLASTER CAST XFAST 3X15 (CAST SUPPLIES) IMPLANT
SPLINT PLASTER XTRA FASTSET 3X (CAST SUPPLIES) ×8
SPONGE GAUZE 4X4 12PLY (GAUZE/BANDAGES/DRESSINGS) ×1 IMPLANT
STOCKINETTE 4X48 STRL (DRAPES) ×2 IMPLANT
STRIP CLOSURE SKIN 1/2X4 (GAUZE/BANDAGES/DRESSINGS) ×2 IMPLANT
SUCTION FRAZIER TIP 10 FR DISP (SUCTIONS) IMPLANT
SUT ETHILON 4 0 PS 2 18 (SUTURE) IMPLANT
SUT MERSILENE 4 0 P 3 (SUTURE) ×1 IMPLANT
SUT PROLENE 3 0 PS 2 (SUTURE) IMPLANT
SUT PROLENE 4 0 P 3 18 (SUTURE) ×2 IMPLANT
SUT STEEL 0 (SUTURE) ×2
SUT STEEL 0 18XMFL TIE 17 (SUTURE) ×1 IMPLANT
SUT VIC AB 4-0 P-3 18XBRD (SUTURE) IMPLANT
SUT VIC AB 4-0 P3 18 (SUTURE)
SYR 3ML 23GX1 SAFETY (SYRINGE) IMPLANT
SYR BULB 3OZ (MISCELLANEOUS) ×2 IMPLANT
SYR CONTROL 10ML LL (SYRINGE) IMPLANT
TOWEL OR 17X24 6PK STRL BLUE (TOWEL DISPOSABLE) ×2 IMPLANT
TRAY DSU PREP LF (CUSTOM PROCEDURE TRAY) ×2 IMPLANT
TUBE CONNECTING 20X1/4 (TUBING) IMPLANT
UNDERPAD 30X30 INCONTINENT (UNDERPADS AND DIAPERS) ×2 IMPLANT

## 2013-09-09 NOTE — Anesthesia Procedure Notes (Addendum)
Anesthesia Regional Block:  Supraclavicular block  Pre-Anesthetic Checklist: ,, timeout performed, Correct Patient, Correct Site, Correct Laterality, Correct Procedure, Correct Position, site marked, Risks and benefits discussed, pre-op evaluation, post-op pain management  Laterality: Left  Prep: Maximum Sterile Barrier Precautions used and chloraprep       Needles:  Injection technique: Single-shot  Needle Type: Echogenic Stimulator Needle     Needle Length: 5cm 5 cm Needle Gauge: 22 and 22 G    Additional Needles:  Procedures: ultrasound guided (picture in chart) Supraclavicular block Narrative:  Start time: 09/09/2013 7:57 AM End time: 09/09/2013 8:07 AM Injection made incrementally with aspirations every 5 mL. Anesthesiologist: Fitzgerald,MD  Additional Notes: 2% Lidocaine skin wheel. Intercostobrachial block with 5cc of 0.5% Bupivicaine plain.   Procedure Name: MAC Date/Time: 09/09/2013 8:36 AM Performed by: Melynda Ripple D Pre-anesthesia Checklist: Patient identified, Timeout performed, Emergency Drugs available, Suction available and Patient being monitored Patient Re-evaluated:Patient Re-evaluated prior to inductionOxygen Delivery Method: Simple face mask Intubation Type: IV induction

## 2013-09-09 NOTE — Discharge Instructions (Addendum)
[facility] Center Instructions °Hand Surgery ° °Wound Care: °Keep your hand elevated above the level of your heart.  Do not allow it to dangle by your side.  Keep the dressing dry and do not remove it unless your doctor advises you to do so.  He will usually change it at the time of your post-op visit.  Moving your fingers is advised to stimulate circulation but will depend on the site of your surgery.  If you have a splint applied, your doctor will advise you regarding movement. ° °Activity: °Do not drive or operate machinery today.  Rest today and then you may return to your normal activity and work as indicated by your physician. ° °Diet:  °Drink liquids today or eat a light diet.  You may resume a regular diet tomorrow.   ° °General expectations: °Pain for two to three days. °Fingers may become slightly swollen. ° °Call your doctor if any of the following occur: °Severe pain not relieved by pain medication. °Elevated temperature. °Dressing soaked with blood. °Inability to move fingers. °White or bluish color to fingers. ° ° °Post Anesthesia Home Care Instructions ° °Activity: °Get plenty of rest for the remainder of the day. A responsible adult should stay with you for 24 hours following the procedure.  °For the next 24 hours, DO NOT: °-Drive a car °-Operate machinery °-Drink alcoholic beverages °-Take any medication unless instructed by your physician °-Make any legal decisions or sign important papers. ° °Meals: °Start with liquid foods such as gelatin or soup. Progress to regular foods as tolerated. Avoid greasy, spicy, heavy foods. If nausea and/or vomiting occur, drink only clear liquids until the nausea and/or vomiting subsides. Call your physician if vomiting continues. ° °Special Instructions/Symptoms: °Your throat may feel dry or sore from the anesthesia or the breathing tube placed in your throat during surgery. If this causes discomfort, gargle with warm salt water. The discomfort should disappear within 24  hours. ° ° °Regional Anesthesia Blocks ° °1. Numbness or the inability to move the "blocked" extremity may last from 3-48 hours after placement. The length of time depends on the medication injected and your individual response to the medication. If the numbness is not going away after 48 hours, call your surgeon. ° °2. The extremity that is blocked will need to be protected until the numbness is gone and the  Strength has returned. Because you cannot feel it, you will need to take extra care to avoid injury. Because it may be weak, you may have difficulty moving it or using it. You may not know what position it is in without looking at it while the block is in effect. ° °3. For blocks in the legs and feet, returning to weight bearing and walking needs to be done carefully. You will need to wait until the numbness is entirely gone and the strength has returned. You should be able to move your leg and foot normally before you try and bear weight or walk. You will need someone to be with you when you first try to ensure you do not fall and possibly risk injury. ° °4. Bruising and tenderness at the needle site are common side effects and will resolve in a few days. ° °5. Persistent numbness or new problems with movement should be communicated to the surgeon or the [facility] ([phone])/ [facility] ([phone]). °

## 2013-09-09 NOTE — Brief Op Note (Signed)
09/09/2013  9:48 AM  PATIENT:  [name]  80 y.o. female  PRE-OPERATIVE DIAGNOSIS:  degenerative joint disease  POST-OPERATIVE DIAGNOSIS:  degenerative joint disease left index finger  PROCEDURE:  Procedure(s): FUSION LEFT INDEX PROXIMAL INTERPHALANGEAL JOINT (PIP) (Left)  SURGEON:  Surgeon(s) and Role:    * Cammie Sickle., MD - Primary  PHYSICIAN ASSISTANT:   ASSISTANTS: Kathyrn Sheriff.A-C     ANESTHESIA:   regional  EBL:  Total I/O In: 1000 [I.V.:1000] Out: -   BLOOD ADMINISTERED:none  DRAINS: none   LOCAL MEDICATIONS USED: ropivacaine   SPECIMEN:  Biopsy / Limited Resection  DISPOSITION OF SPECIMEN:  PATHOLOGY  COUNTS:  YES  TOURNIQUET:   Total Tourniquet Time Documented: Upper Arm (Left) - 59 minutes Total: Upper Arm (Left) - 59 minutes   DICTATION: .Other Dictation: Dictation Number [id number]  PLAN OF CARE: Discharge to home after PACU  PATIENT DISPOSITION:  PACU - hemodynamically stable.   Delay start of Pharmacological VTE agent (>24hrs) due to surgical blood loss or risk of bleeding: not applicable

## 2013-09-09 NOTE — Progress Notes (Signed)
Assisted Dr. Fitzgerald with left, ultrasound guided, supraclavicular block. Side rails up, monitors on throughout procedure. See vital signs in flow sheet. Tolerated Procedure well. 

## 2013-09-09 NOTE — Anesthesia Postprocedure Evaluation (Signed)
  Anesthesia Post-op Note  Patient: [name]  Procedure(s) Performed: Procedure(s): FUSION LEFT INDEX PROXIMAL INTERPHALANGEAL JOINT (PIP) (Left)  Patient Location: PACU  Anesthesia Type: MAC with Block  Level of Consciousness: awake and alert   Airway and Oxygen Therapy: Patient Spontanous Breathing  Post-op Pain: none  Post-op Assessment: Post-op Vital signs reviewed, Patient's Cardiovascular Status Stable and Respiratory Function Stable  Post-op Vital Signs: Reviewed  Filed Vitals:   09/09/13 1030  BP: 128/63  Pulse: 67  Temp:   Resp: 12    Complications: No apparent anesthesia complications

## 2013-09-09 NOTE — Anesthesia Preprocedure Evaluation (Addendum)
Anesthesia Evaluation  Patient identified by MRN, date of birth, ID band Patient awake  General Assessment Comment:Family hx of MH  Reviewed: Allergy & Precautions, H&P , NPO status , Patient's Chart, lab work & pertinent test results  History of Anesthesia Complications (+) MALIGNANT HYPERTHERMIA and Family history of anesthesia reactionNegative for: history of anesthetic complications  Airway Mallampati: II TM Distance: >3 FB Neck ROM: Limited    Dental no notable dental hx. (+) Teeth Intact, Dental Advisory Given   Pulmonary asthma , COPD COPD inhaler, former smoker,  breath sounds clear to auscultation  Pulmonary exam normal       Cardiovascular hypertension, On Medications Rhythm:Regular Rate:Normal     Neuro/Psych Anxiety Depression negative neurological ROS     GI/Hepatic negative GI ROS, Neg liver ROS, GERD-  Medicated and Controlled,  Endo/Other  Hypothyroidism   Renal/GU negative Renal ROS  negative genitourinary   Musculoskeletal   Abdominal   Peds  Hematology negative hematology ROS (+) anemia ,   Anesthesia Other Findings   Reproductive/Obstetrics negative OB ROS                        Anesthesia Physical Anesthesia Plan  ASA: III  Anesthesia Plan: MAC and Regional   Post-op Pain Management:    Induction: Intravenous  Airway Management Planned: Simple Face Mask  Additional Equipment:   Intra-op Plan:   Post-operative Plan:   Informed Consent: I have reviewed the patients History and Physical, chart, labs and discussed the procedure including the risks, benefits and alternatives for the proposed anesthesia with the patient or authorized representative who has indicated his/her understanding and acceptance.   Dental advisory given  Plan Discussed with: CRNA and Surgeon  Anesthesia Plan Comments:        Anesthesia Quick Evaluation

## 2013-09-09 NOTE — Transfer of Care (Signed)
Immediate Anesthesia Transfer of Care Note  Patient: [name]  Procedure(s) Performed: Procedure(s): FUSION LEFT INDEX PROXIMAL INTERPHALANGEAL JOINT (PIP) (Left)  Patient Location: PACU  Anesthesia Type:MAC and Regional  Level of Consciousness: awake, alert  and oriented  Airway & Oxygen Therapy: Patient Spontanous Breathing and Patient connected to face mask oxygen  Post-op Assessment: Report given to PACU RN and Post -op Vital signs reviewed and stable  Post vital signs: Reviewed and stable  Complications: No apparent anesthesia complications

## 2013-09-10 NOTE — Op Note (Signed)
NAME:  [name], HAREWOOD                   ACCOUNT NO.:  MEDICAL RECORD NO.:  [id number]  LOCATION:                                 FACILITY:  PHYSICIAN:  Youlanda Mighty. Agatha Duplechain, M.D. DATE OF BIRTH:  1932-06-12  DATE OF PROCEDURE:  09/09/2013 DATE OF DISCHARGE:                              OPERATIVE REPORT   PREOPERATIVE DIAGNOSIS:  Severe pseudogout with end-stage arthritis of left index finger proximal interphalangeal joint with background complex predicaments of high-dose prednisone therapy for chronic obstructive airways disease and a history of malignant hyperthermia in a family member.  POSTOPERATIVE DIAGNOSIS:  Severe pseudogout with end-stage arthritis of left index finger proximal interphalangeal joint with background complex predicaments of high-dose prednisone therapy for chronic obstructive airways disease and a history of malignant hyperthermia in a family member.  OPERATION:  Arthrodesis of left index finger, proximal interphalangeal joint utilizing 0.035 inch Kirschner wires x2 and a hybrid tension band construct to avoid cystic regions of proximal phalanx.  OPERATING SURGEON:  Youlanda Mighty. Ellieanna Funderburg, MD  ASSISTANT:  Marily Lente. Dasnoit, PA-C.  ANESTHESIA:  Interscalene block, left arm supplemented by IV sedation.  SUPERVISING ANESTHESIOLOGIST:  Dr. Ola Spurr.  INDICATIONS:  [name] is a well-known patient who has pseudogout and severe arthritis of all of her interphalangeal joints, her wrists, and her spine.  She has had extensive orthopedic care in the past having had multiple interphalangeal joints arthrodesis due to severe pain and status post proximal row carpectomy of one wrist also due to pseudogout.  Recently, she developed end-stage over left index finger PIP joint with 20 degrees of ulnar deviation and extreme pain with any motion.  She has failed multiple attempts at steroid injection and splinting.  We advised to proceed with arthrodesis of her PIP  joint at this time.  The preoperative films revealed a cystic region in the proximal phalangeal head that could complicate internal fixation.  We elected to proceed with tension band and pin technique due to some issues with her bone quality and her high-dose prednisone regimen.  After informed consent, she is brought to the operating at this time. Julia has a history of malignant hyperthermia affecting her daughter.  This was reviewed by [facility] and Dr. Ola Spurr.  They elected to proceed with regional anesthesia and sedation.  After informed consent, she is brought to the operating room at this time.  PROCEDURE:  Linell [name] was interviewed in the holding area and her left hand and index finger marked as the proper surgical site per protocol with a marking pen.  Dr. Ola Spurr provided detailed anesthesia informed consent, and subsequently placed a plexus block with ultrasound guidance leading to excellent anesthesia of the left upper extremity.  Following an orthopaedic and question and answer session, she was transferred to room 6 of the [facility] and placed in supine position on the operating table.  Under Dr. Blane Ohara direct supervision, IV sedation was provided.  A 2 g of Ancef were administered as an IV prophylactic antibiotic.  Her allergies to iodine and morphine were noted.  The left hand and arm were then prepped with Betadine soap and solution, sterilely draped.  A pneumatic  tourniquet was applied to the proximal left brachium and set at 230 mmHg.  Following routine surgical time-out.  Ms. Racine left arm and hand were exsanguinated with an Esmarch bandage and the arterial tourniquet inflated at 230 mmHg.  Procedure commenced with a curvilinear incision exposing the extensor mechanism overlying the left index finger.  The extensor tendon was incised in its midline and the triangular ligament released, the central slip released, and the  collateral ligaments released.  The joint was opened in shotgun style.  Abundant synovitis was noted with  Depo-Medrol steroid lying directly in the synovitis.  Synovitis was removed with a rongeur and sent for biopsy.  The PIP joint was cleared of all synovium.  Subsequently, we performed a cup and cone arthrodesis.  There was a large cyst in the radial condyle of the proximal phalanx that rendered fixation somewhat interesting.  We subsequently placed a hybrid figure-of-eight wire through drill holes in the proximal and middle phalanges.  We then placed two 0.035 inch Kirschner wires offset into the ulnar condyle for better bone purchase. We then created a hybrid construct of a tension band wire and gradually compressed the joint surfaces with the tension band wire followed by advancing the 0.035 inch Kirschner wires to obtain excellent bony fixation.  We had a good display between the wires to obtain rotational control.  After the wires were appropriately tensioned, they were trimmed in the usual manner.  The tips of the K-wires were bent and buried and the 26- gauge tension band wire was also bent and buried.  The wound was irrigated thoroughly followed by hemostasis.  The periosteum was repaired with interrupted suture of 4-0 Mersilene followed by anatomic repair of the extensor mechanism with figure-of- eight sutures of 4-0 Mersilene knots buried followed by a running finishing suture of 4-0 Mersilene knots buried.  The skin was repaired with segmental intradermal 4-0 Prolene with Steri- Strips.  The left hand was placed in compressive dressing followed by a volar plaster splint securing the index of the long fingers in the safe position.  For aftercare, she was provided a prescription for Percocet 5 mg 1 or 2 tablets p.o. q.4-6 hours p.r.n. pain, 30 tablets without refill.  Also, she is provided Keflex 500 mg 1 p.o. q.8 hours x4 days  prophylactic antibiotic.     Youlanda Mighty Nussen Pullin, M.D.     RVS/MEDQ  D:  09/09/2013  T:  09/10/2013  Job:  [id number]

## 2013-09-11 ENCOUNTER — Ambulatory Visit (INDEPENDENT_AMBULATORY_CARE_PROVIDER_SITE_OTHER): Payer: Medicare Other | Admitting: Internal Medicine

## 2013-09-11 ENCOUNTER — Encounter: Payer: Self-pay | Admitting: Internal Medicine

## 2013-09-11 VITALS — BP 95/59 | HR 100 | Temp 97.9°F | Wt 124.0 lb

## 2013-09-11 DIAGNOSIS — J449 Chronic obstructive pulmonary disease, unspecified: Secondary | ICD-10-CM

## 2013-09-11 DIAGNOSIS — G4710 Hypersomnia, unspecified: Secondary | ICD-10-CM

## 2013-09-11 DIAGNOSIS — D649 Anemia, unspecified: Secondary | ICD-10-CM | POA: Diagnosis not present

## 2013-09-11 DIAGNOSIS — E039 Hypothyroidism, unspecified: Secondary | ICD-10-CM

## 2013-09-11 DIAGNOSIS — I2699 Other pulmonary embolism without acute cor pulmonale: Secondary | ICD-10-CM | POA: Diagnosis not present

## 2013-09-11 MED ORDER — ATORVASTATIN CALCIUM 40 MG PO TABS: 40.0000 mg | ORAL_TABLET | Freq: Every day | ORAL | Status: DC

## 2013-09-11 NOTE — Assessment & Plan Note (Addendum)
Symptoms well-controlled with Adderall. UDS November 2014 was negative. Refill as needed

## 2013-09-11 NOTE — Assessment & Plan Note (Signed)
Last TSH satisfactory, and no change

## 2013-09-11 NOTE — Assessment & Plan Note (Signed)
Follow up - outpatient-- Dr Verdie Mosher On by mouth steroids, doing great

## 2013-09-11 NOTE — Assessment & Plan Note (Signed)
Per Dr. Freda Munro, last visit 04/2012

## 2013-09-11 NOTE — Patient Instructions (Addendum)
Next visit is for routine check up  in 3 months  No need to come back fasting Please make an appointment    

## 2013-09-11 NOTE — Progress Notes (Signed)
Subjective:    Patient ID: [name], female    DOB: [birth year], 80 y.o.   MRN: [id number]  DOS:  09/11/2013 Type of  visit: Routine office visit, here with her husband. Hypertension, BP today slightly low, usually better in the outpatient setting. Seizure disorder, on phenobarbital, labs reviewed. Osteoporosis, due for a the injection, not available today. Anemia, note from [place] 04/2013 reviewed. High cholesterol, needs a prescription for Lipitor.    ROS In general doing great. Denies chest pain, shortness or breath better than baseline. No vomiting, diarrhea or blood in the stools  Past Medical History  Diagnosis Date  . Anemia   . Anxiety -depression     patient denied  . Asthma -COPD   . Chronic fatigue syndrome   . GERD (gastroesophageal reflux disease)   . Hyperlipidemia   . Hypertension   . Colon polyp   . Allergic rhinitis   . Vitamin D deficiency   . Pulmonary embolism     1980s  . Neuropathy   . Arthritis   . 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 Blood transfusion   . Osteoporosis   . Hypothyroid   . Constipation   . Family history of anesthesia complication     Daughter with history of malignant hyperthermia  . Seizures     1990 last seizures on meds Phenobarb  . Skin cancer     squamous cell  . Neurogenic bladder 2013    husband caths pt  . chronic sinusitis 08/08/2012  . Shingles late 67's  . Cystocele 09/16/2012  . Tubular adenoma     Past Surgical History  Procedure Laterality Date  . Tonsillectomy and adenoidectomy    . Nasal septum surgery    . Dilation and curettage of uterus    . Tubal ligation    . Cesarean section    . Sinus surgeries      x 4  . Left ovary and tube removed    . Vocal polyps removed    . Appendectomy    . Cystocele repair    . Carpal tunnel release      right  . Shoulder arthroscopy      x2 left, 1 right  . Left finger fusion    . Right median nerve decompression    .  Duptyren's contracture right hand    . Finger ganglion cyst excision      right  . Abdominal hysterectomy    . Joint replacement      right and left basal joints of thumbs  . Bladder suspension    . Cataract extraction      bilateral  . Cervical neck ablation      x 7, C3-C4  . Squamous lesions removed      neck and face  . Basal cell carcinoma excision      face  . Panniculectomy    . Nasal polyp surgery      4  . Anterior fusion clivus-c2 extraoral w/ odontoid excision  8/14    History   Social History  . Marital Status: Married    Spouse Name: N/A    Number of Children: 3  . Years of Education: N/A   Occupational History  . retired, Licensed conveyancer    Social History Main Topics  . Smoking status: Former Smoker -- 0.50 packs/day for 3 years    Types: Cigarettes    Quit date: 05/29/1978  . Smokeless tobacco: Never Used  .  Alcohol Use: No  . Drug Use: No  . Sexual Activity: Yes   Other Topics Concern  . Not on file   Social History Narrative   Pt lives at home with her spouse.   Moved to [place] ~ 2004        Medication List       This list is accurate as of: 09/11/13  3:14 PM.  Always use your most recent med list.               amLODipine 10 MG tablet  Commonly known as:  NORVASC  Take 1 tablet (10 mg total) by mouth every morning.     amphetamine-dextroamphetamine 10 MG tablet  Commonly known as:  ADDERALL  Take 1 tablet (10 mg total) by mouth 4 (four) times daily. May     arformoterol 15 MCG/2ML Nebu  Commonly known as:  BROVANA  Take 2 mLs (15 mcg total) by nebulization 2 (two) times daily.     atorvastatin 40 MG tablet  Commonly known as:  LIPITOR  Take 1 tablet (40 mg total) by mouth daily.     CALCIUM-VITAMIN D PO  Take 1 tablet by mouth daily.     denosumab 60 MG/ML Soln injection  Commonly known as:  PROLIA  Inject 60 mg into the skin every 6 (six) months. Administer in upper arm, thigh, or abdomen     dexlansoprazole 60 MG  capsule  Commonly known as:  DEXILANT  Take 1 capsule (60 mg total) by mouth daily.     levothyroxine 75 MCG tablet  Commonly known as:  SYNTHROID, LEVOTHROID  Take 75 mcg by mouth daily before breakfast. Takes half=37.5mg      lidocaine 5 %  Commonly known as:  LIDODERM  Place 2 patches onto the skin every morning. Remove & Discard patch within 12 hours or as directed by MD. Applies to back and neck.     MENEST 0.3 MG tablet  Generic drug:  Esterified Estrogens  Take 0.3 mg by mouth daily.     montelukast 10 MG tablet  Commonly known as:  SINGULAIR  Take 10 mg by mouth at bedtime.     NASONEX 50 MCG/ACT nasal spray  Generic drug:  mometasone  PLACE 2 SPRAYS INTO THE NOSE DAILY     ondansetron 4 MG disintegrating tablet  Commonly known as:  ZOFRAN-ODT  Take 1 tablet (4 mg total) by mouth 3 (three) times daily.     PHENobarbital 32.4 MG tablet  Commonly known as:  LUMINAL  Take 1-2 tablets (32.4-64.8 mg total) by mouth 4 (four) times daily -  with meals and at bedtime. She takes one tablet at 6 am, one tablet at noon and two tabletss at 8 pm.     polyethylene glycol packet  Commonly known as:  MIRALAX / GLYCOLAX  Take 17 g by mouth at bedtime.     predniSONE 20 MG tablet  Commonly known as:  DELTASONE  Take 20 mg by mouth daily.     PROAIR HFA 108 (90 BASE) MCG/ACT inhaler  Generic drug:  albuterol  Inhale 1 puff into the lungs as needed.     theophylline 100 MG 12 hr tablet  Commonly known as:  THEODUR  Take 100 mg by mouth every morning.     Vitamin D (Ergocalciferol) 50000 UNITS Caps capsule  Commonly known as:  DRISDOL  TAKE 1 CAPSULE EVERY WEEK     VOLTAREN 1 % Gel  Generic drug:  diclofenac sodium  Apply 1 application topically 4 (four) times daily as needed. Applies to fingers and hands for gout.           Objective:   Physical Exam BP 95/59  Pulse 100  Temp(Src) 97.9 F (36.6 C)  Wt 124 lb (56.246 kg)  SpO2 96% General -- alert,  well-developed, NAD.  Lungs -- normal respiratory effort, no intercostal retractions, no accessory muscle use, decreased breath sounds.  Heart-- normal rate, regular rhythm, no murmur.  Extremities-- no pretibial edema bilaterally  Neurologic--  alert & oriented X3 Psych-- Cognition and judgment appear intact. Cooperative with normal attention span and concentration. No anxious or depressed appearing.         Assessment & Plan:   Gynecologist is Dr. Willis Modena

## 2013-09-11 NOTE — Progress Notes (Signed)
Pre visit review using our clinic review tool, if applicable. No additional management support is needed unless otherwise documented below in the visit note. 

## 2013-09-12 DIAGNOSIS — J449 Chronic obstructive pulmonary disease, unspecified: Secondary | ICD-10-CM | POA: Diagnosis not present

## 2013-09-12 DIAGNOSIS — J45909 Unspecified asthma, uncomplicated: Secondary | ICD-10-CM | POA: Diagnosis not present

## 2013-09-15 ENCOUNTER — Telehealth: Payer: Self-pay | Admitting: Internal Medicine

## 2013-09-15 ENCOUNTER — Telehealth: Payer: Self-pay | Admitting: *Deleted

## 2013-09-15 ENCOUNTER — Encounter (HOSPITAL_BASED_OUTPATIENT_CLINIC_OR_DEPARTMENT_OTHER): Payer: Self-pay | Admitting: Orthopedic Surgery

## 2013-09-15 MED ORDER — DEXLANSOPRAZOLE 60 MG PO CPDR: 60.0000 mg | DELAYED_RELEASE_CAPSULE | Freq: Every day | ORAL | Status: DC

## 2013-09-15 NOTE — Telephone Encounter (Signed)
Went over [employer] summary form with pt. Pt verbalized understanding / will proceed with injection and call back to schedule.

## 2013-09-15 NOTE — Telephone Encounter (Signed)
Spoke to pt. Told her I send dexilant to [facility]. Pt verbalized understanding

## 2013-09-17 DIAGNOSIS — M11849 Other specified crystal arthropathies, unspecified hand: Secondary | ICD-10-CM | POA: Diagnosis not present

## 2013-09-17 DIAGNOSIS — M19049 Primary osteoarthritis, unspecified hand: Secondary | ICD-10-CM | POA: Diagnosis not present

## 2013-10-01 ENCOUNTER — Other Ambulatory Visit: Payer: Self-pay | Admitting: *Deleted

## 2013-10-01 MED ORDER — MONTELUKAST SODIUM 10 MG PO TABS: 10.0000 mg | ORAL_TABLET | Freq: Every day | ORAL | Status: DC

## 2013-10-01 NOTE — Telephone Encounter (Signed)
Rx sent to the pharmacy by e-script.//AB/CMA 

## 2013-10-02 ENCOUNTER — Encounter: Payer: Self-pay | Admitting: Internal Medicine

## 2013-10-02 DIAGNOSIS — G6181 Chronic inflammatory demyelinating polyneuritis: Secondary | ICD-10-CM

## 2013-10-03 ENCOUNTER — Other Ambulatory Visit: Payer: Self-pay | Admitting: *Deleted

## 2013-10-03 MED ORDER — AMPHETAMINE-DEXTROAMPHETAMINE 10 MG PO TABS: 10.0000 mg | ORAL_TABLET | Freq: Four times a day (QID) | ORAL | Status: DC

## 2013-10-03 NOTE — Telephone Encounter (Signed)
Please print the Rx

## 2013-10-03 NOTE — Telephone Encounter (Signed)
Requesting Amphetamine 10mg -Take 1 tablet by mouth 4 times a day. Last refill:09-01-13;#120,0 Last OV:03-13-14-Pt has appt scheduled for:12-19-13 UDS:04-18-13-Low-Due Please advise.//AB/CMA

## 2013-10-03 NOTE — Telephone Encounter (Addendum)
Rx was printed will be placed at the front desk for the pt to pick-up.//AB/CMA

## 2013-10-06 DIAGNOSIS — D485 Neoplasm of uncertain behavior of skin: Secondary | ICD-10-CM | POA: Diagnosis not present

## 2013-10-06 DIAGNOSIS — L280 Lichen simplex chronicus: Secondary | ICD-10-CM | POA: Diagnosis not present

## 2013-10-10 ENCOUNTER — Ambulatory Visit: Payer: Medicare Other | Admitting: Hematology & Oncology

## 2013-10-10 ENCOUNTER — Other Ambulatory Visit: Payer: Medicare Other | Admitting: Lab

## 2013-10-17 DIAGNOSIS — M19039 Primary osteoarthritis, unspecified wrist: Secondary | ICD-10-CM | POA: Diagnosis not present

## 2013-10-17 DIAGNOSIS — M79609 Pain in unspecified limb: Secondary | ICD-10-CM | POA: Diagnosis not present

## 2013-10-24 ENCOUNTER — Encounter: Payer: Self-pay | Admitting: Nurse Practitioner

## 2013-10-27 ENCOUNTER — Other Ambulatory Visit (HOSPITAL_BASED_OUTPATIENT_CLINIC_OR_DEPARTMENT_OTHER): Payer: Medicare Other | Admitting: Lab

## 2013-10-27 ENCOUNTER — Ambulatory Visit (HOSPITAL_BASED_OUTPATIENT_CLINIC_OR_DEPARTMENT_OTHER): Payer: Medicare Other | Admitting: Hematology & Oncology

## 2013-10-27 ENCOUNTER — Telehealth: Payer: Self-pay | Admitting: Hematology & Oncology

## 2013-10-27 ENCOUNTER — Encounter: Payer: Self-pay | Admitting: Hematology & Oncology

## 2013-10-27 VITALS — BP 129/69 | HR 100 | Temp 97.6°F | Resp 14 | Ht 62.0 in | Wt 121.0 lb

## 2013-10-27 DIAGNOSIS — Z862 Personal history of diseases of the blood and blood-forming organs and certain disorders involving the immune mechanism: Secondary | ICD-10-CM

## 2013-10-27 DIAGNOSIS — D649 Anemia, unspecified: Secondary | ICD-10-CM

## 2013-10-27 LAB — CBC WITH DIFFERENTIAL (CANCER CENTER ONLY)
BASO#: 0 10*3/uL (ref 0.0–0.2)
BASO%: 0.5 % (ref 0.0–2.0)
EOS%: 3.7 % (ref 0.0–7.0)
Eosinophils Absolute: 0.2 10*3/uL (ref 0.0–0.5)
HCT: 40.2 % (ref 34.8–46.6)
HGB: 13.5 g/dL (ref 11.6–15.9)
LYMPH#: 2 10*3/uL (ref 0.9–3.3)
LYMPH%: 33.7 % (ref 14.0–48.0)
MCH: 30.1 pg (ref 26.0–34.0)
MCHC: 33.6 g/dL (ref 32.0–36.0)
MCV: 90 fL (ref 81–101)
MONO#: 0.5 10*3/uL (ref 0.1–0.9)
MONO%: 8.8 % (ref 0.0–13.0)
NEUT#: 3.1 10*3/uL (ref 1.5–6.5)
NEUT%: 53.3 % (ref 39.6–80.0)
Platelets: 267 10*3/uL (ref 145–400)
RBC: 4.48 10*6/uL (ref 3.70–5.32)
RDW: 13.5 % (ref 11.1–15.7)
WBC: 5.9 10*3/uL (ref 3.9–10.0)

## 2013-10-27 LAB — CHCC SATELLITE - SMEAR

## 2013-10-27 NOTE — Progress Notes (Signed)
Hematology and Oncology Follow Up Visit  [name] [id number] 1932-12-03 80 y.o. 10/27/2013   Principle Diagnosis:   Iron deficiency anemia-stable  Current Therapy:    IV iron as indicated     Interim History:  Ms.  [name] is back for followup. Last saw her back in December of last year. She's been doing okay. She is really really bad arthritis. She currently has undergone fusion of the bones in her index finger on the left hand. She has had multiple fusion procedures in the past.  She has had neck issues. She's had back issues. She really has had arthritis Primus in every joint.  We have not given her any IV iron now for about a year and a half.  We last saw her, her ferritin was 132 with an iron saturation 33%.  There's been no bleeding. She's had no weight loss weight gain. She's had no change in medications.  Medications: Current outpatient prescriptions:amLODipine (NORVASC) 10 MG tablet, Take 1 tablet (10 mg total) by mouth every morning., Disp: 30 tablet, Rfl: 12;  amphetamine-dextroamphetamine (ADDERALL) 10 MG tablet, Take 1 tablet (10 mg total) by mouth 4 (four) times daily. May, Disp: 120 tablet, Rfl: 0;  arformoterol (BROVANA) 15 MCG/2ML NEBU, Take 2 mLs (15 mcg total) by nebulization 2 (two) times daily., Disp: 120 mL, Rfl: 0 atorvastatin (LIPITOR) 40 MG tablet, Take 1 tablet (40 mg total) by mouth daily., Disp: 90 tablet, Rfl: 2;  CALCIUM-VITAMIN D PO, Take 1 tablet by mouth daily. , Disp: , Rfl: ;  denosumab (PROLIA) 60 MG/ML SOLN injection, Inject 60 mg into the skin every 6 (six) months. Administer in upper arm, thigh, or abdomen, Disp: , Rfl: ;  dexlansoprazole (DEXILANT) 60 MG capsule, Take 60 mg by mouth every other day., Disp: , Rfl:  diclofenac sodium (VOLTAREN) 1 % GEL, Apply 1 application topically 4 (four) times daily as needed. Applies to fingers and hands for gout., Disp: , Rfl: ;  Esterified Estrogens (MENEST) 0.3 MG tablet, Take 0.3 mg by mouth daily., Disp: ,  Rfl: ;  levothyroxine (SYNTHROID, LEVOTHROID) 75 MCG tablet, Take 75 mcg by mouth daily before breakfast. TAKES 1 1/2 TABS DAILY = 112.5 MCG DAILY, Disp: , Rfl:  lidocaine (LIDODERM) 5 %, Place 2 patches onto the skin as needed. Marland Kitchen Applies to back and neck., Disp: , Rfl: ;  METRONIDAZOLE, TOPICAL, 0.75 % LOTN, Apply topically as needed. , Disp: , Rfl: ;  montelukast (SINGULAIR) 10 MG tablet, Take 1 tablet (10 mg total) by mouth at bedtime., Disp: 90 tablet, Rfl: 1;  NASONEX 50 MCG/ACT nasal spray, PLACE 2 SPRAYS INTO THE NOSE DAILY, Disp: 17 g, Rfl: 3 ondansetron (ZOFRAN-ODT) 4 MG disintegrating tablet, Take 1 tablet (4 mg total) by mouth 3 (three) times daily., Disp: 90 tablet, Rfl: 3;  PHENobarbital (LUMINAL) 32.4 MG tablet, Take 32.4-64.8 mg by mouth 4 (four) times daily -  with meals and at bedtime. one tablet at 6 am, one tablet at noon,  two tabletss at 8 pm., Disp: , Rfl: ;  polyethylene glycol (MIRALAX / GLYCOLAX) packet, Take 17 g by mouth at bedtime., Disp: 14 each, Rfl:  predniSONE (DELTASONE) 20 MG tablet, Take 20 mg by mouth daily., Disp: , Rfl: ;  PROAIR HFA 108 (90 BASE) MCG/ACT inhaler, Inhale 1 puff into the lungs as needed. , Disp: , Rfl: ;  theophylline (THEODUR) 100 MG 12 hr tablet, Take 100 mg by mouth every morning., Disp: , Rfl: ;  Vitamin  D, Ergocalciferol, (DRISDOL) 50000 UNITS CAPS capsule, TAKE 1 CAPSULE EVERY WEEK, Disp: 12 capsule, Rfl: 3  Allergies:  Allergies  Allergen Reactions  . Iodine Other (See Comments)    Cardiac arrest  . Morphine And Related Other (See Comments)    Cardiac arrest    Past Medical History, Surgical history, Social history, and Family History were reviewed and updated.  Review of Systems: As above  Physical Exam:  height is 5\' 2"  (1.575 m) and weight is 121 lb (54.885 kg). Her oral temperature is 97.6 F (36.4 C). Her blood pressure is 129/69 and her pulse is 100. Her respiration is 14.   Petite white female. She is on is osteoarthritic  changes in a lot of her joints. She has some kyphosis. There is no palpable liver or spleen tip. Her lungs are clear. Cardiac exam regular in rhythm. Skin exam no rashes.  Lab Results  Component Value Date   WBC 5.9 10/27/2013   HGB 13.5 10/27/2013   HCT 40.2 10/27/2013   MCV 90 10/27/2013   PLT 267 10/27/2013     Chemistry      Component Value Date/Time   NA 143 09/03/2013 1600   K 4.0 09/03/2013 1600   CL 105 09/03/2013 1600   CO2 24 09/03/2013 1600   BUN 23 09/03/2013 1600   CREATININE 0.73 09/03/2013 1600   CREATININE 0.78 06/27/2012 1139      Component Value Date/Time   CALCIUM 9.4 09/03/2013 1600   ALKPHOS 92 01/30/2013 0956   AST 27 01/30/2013 0956   ALT 17 01/30/2013 0956   BILITOT 0.3 01/30/2013 0956         Impression and Plan: [name] is 80 year old female with history of iron deficiency. We have corrected is. We've not given her any iron for over a year and a half. Her blood counts are holding steady. Her MCV is stable which I think is a good indicator of her iron stores. I would be very surprised if her iron level was low.  For now, since she is doing well from my point of view and since she her husband are very busy traveling, I told her that she can always come back when she needs to for lab work and for iron. I told her that he always gives a call and we can get her in for lab work.  Volanda Napoleon, MD 6/1/20155:37 PM

## 2013-10-27 NOTE — Telephone Encounter (Signed)
Pt MD disregard in basket message pt will call when she needs to come in. OK to schedule when she calls

## 2013-10-28 DIAGNOSIS — H40019 Open angle with borderline findings, low risk, unspecified eye: Secondary | ICD-10-CM | POA: Diagnosis not present

## 2013-10-28 LAB — IRON AND TIBC CHCC
%SAT: 26 % (ref 21–57)
Iron: 69 ug/dL (ref 41–142)
TIBC: 268 ug/dL (ref 236–444)
UIBC: 198 ug/dL (ref 120–384)

## 2013-10-28 LAB — FERRITIN CHCC: Ferritin: 68 ng/ml (ref 9–269)

## 2013-10-29 ENCOUNTER — Other Ambulatory Visit: Payer: Self-pay | Admitting: *Deleted

## 2013-10-29 ENCOUNTER — Telehealth: Payer: Self-pay | Admitting: *Deleted

## 2013-10-29 DIAGNOSIS — D649 Anemia, unspecified: Secondary | ICD-10-CM

## 2013-10-29 NOTE — Telephone Encounter (Signed)
Informed pt that iron is low. Set pt up for June 11 for IV iron infusion.

## 2013-10-31 DIAGNOSIS — M19039 Primary osteoarthritis, unspecified wrist: Secondary | ICD-10-CM | POA: Diagnosis not present

## 2013-11-03 ENCOUNTER — Encounter: Payer: Self-pay | Admitting: Internal Medicine

## 2013-11-04 ENCOUNTER — Telehealth: Payer: Self-pay | Admitting: Critical Care Medicine

## 2013-11-04 ENCOUNTER — Telehealth: Payer: Self-pay | Admitting: *Deleted

## 2013-11-04 DIAGNOSIS — G6181 Chronic inflammatory demyelinating polyneuritis: Secondary | ICD-10-CM

## 2013-11-04 MED ORDER — AMPHETAMINE-DEXTROAMPHETAMINE 10 MG PO TABS: 10.0000 mg | ORAL_TABLET | Freq: Four times a day (QID) | ORAL | Status: DC

## 2013-11-04 MED ORDER — ARFORMOTEROL TARTRATE 15 MCG/2ML IN NEBU: 2.0000 mL | INHALATION_SOLUTION | Freq: Two times a day (BID) | RESPIRATORY_TRACT | Status: DC

## 2013-11-04 NOTE — Telephone Encounter (Signed)
Received faxed refill reqeust for Brovana from [facility] in [place]. Pt was last seen by PW on 06/13/12; was asked to f/u in 6 months.   She has no pending appts.  Called, spoke with pt -  We have scheduled her to see PW on July 16 at 9:15 am in [place] (date per pt's request).   She has 7 days of brovana left.   Advised I would send Brovana rx to last until OV but would need to keep this for further refills. She verbalized understanding and voiced no further questions or concerns at this time.

## 2013-11-04 NOTE — Telephone Encounter (Signed)
rx refill- Adderall 10 mg  Last OV- 09/11/13 Last refilled- 10/03/13 #120 / 0 rf  UDS- 04/18/13 LOW risk

## 2013-11-04 NOTE — Telephone Encounter (Signed)
Done, Two prescriptions for  June and July

## 2013-11-04 NOTE — Telephone Encounter (Signed)
Done, see previous note.

## 2013-11-04 NOTE — Telephone Encounter (Signed)
Spoke with patient and made her aware that the scripts for Adderall were ready to be picked up.

## 2013-11-04 NOTE — Telephone Encounter (Signed)
Patient is requesting a refill for Adderall Last office visit 09/11/13 Last filled 10/03/13 #120 UDS low risk Okay to refill?

## 2013-11-04 NOTE — Telephone Encounter (Signed)
Spoke with patient and made aware that Rx is ready to be picked up

## 2013-11-06 ENCOUNTER — Ambulatory Visit (HOSPITAL_BASED_OUTPATIENT_CLINIC_OR_DEPARTMENT_OTHER): Payer: Medicare Other

## 2013-11-06 VITALS — BP 130/81 | HR 95 | Temp 95.6°F | Resp 16

## 2013-11-06 DIAGNOSIS — D649 Anemia, unspecified: Secondary | ICD-10-CM | POA: Diagnosis not present

## 2013-11-06 MED ORDER — SODIUM CHLORIDE 0.9 % IV SOLN
1020.0000 mg | Freq: Once | INTRAVENOUS | Status: AC
  Administered 2013-11-06: 1020 mg via INTRAVENOUS
  Filled 2013-11-06: qty 34

## 2013-11-06 MED ORDER — SODIUM CHLORIDE 0.9 % IV SOLN
INTRAVENOUS | Status: DC
  Administered 2013-11-06: 13:00:00 via INTRAVENOUS

## 2013-11-06 MED ORDER — SODIUM CHLORIDE 0.9 % IV SOLN: INTRAVENOUS | Status: DC

## 2013-11-06 MED ORDER — SODIUM CHLORIDE 0.9 % IV SOLN: 1020.0000 mg | Freq: Once | INTRAVENOUS | Status: DC

## 2013-11-06 NOTE — Patient Instructions (Signed)

## 2013-11-13 ENCOUNTER — Telehealth: Payer: Self-pay | Admitting: Internal Medicine

## 2013-11-13 DIAGNOSIS — R7989 Other specified abnormal findings of blood chemistry: Secondary | ICD-10-CM

## 2013-11-13 NOTE — Telephone Encounter (Signed)
Caller name: Caroleena  Relation to pt: patient  Call back number: [phone] Pharmacy:  Reason for call: Patient called and stated that Dr Lorrine Kin from [facility] will be faxing over lab orders for her. Patient would like for [place] to call her when we receive her lab orders. Please advise.

## 2013-11-17 DIAGNOSIS — N6459 Other signs and symptoms in breast: Secondary | ICD-10-CM | POA: Diagnosis not present

## 2013-11-18 ENCOUNTER — Other Ambulatory Visit: Payer: Self-pay | Admitting: Internal Medicine

## 2013-11-18 DIAGNOSIS — N6459 Other signs and symptoms in breast: Secondary | ICD-10-CM | POA: Diagnosis not present

## 2013-11-18 DIAGNOSIS — R7989 Other specified abnormal findings of blood chemistry: Secondary | ICD-10-CM

## 2013-11-18 NOTE — Telephone Encounter (Signed)
Spoke with patient and made aware that we have received the fax from [facility] and lab appt scheduled.   Lab orders placed, to be faxed to [facility] [phone] attn: Armandina Stammer, BSN, RN

## 2013-11-18 NOTE — Telephone Encounter (Signed)
Pt stopped by office, wanted to know if we received fax from [facility] with lab orders.  She called our office last week and again yesterday to find out and has not heard anything back from our office.  Please call her asap [phone] or husband cell [phone].

## 2013-11-19 ENCOUNTER — Other Ambulatory Visit (INDEPENDENT_AMBULATORY_CARE_PROVIDER_SITE_OTHER): Payer: Medicare Other

## 2013-11-19 DIAGNOSIS — R7989 Other specified abnormal findings of blood chemistry: Secondary | ICD-10-CM | POA: Diagnosis not present

## 2013-11-19 LAB — CBC WITH DIFFERENTIAL/PLATELET
Basophils Absolute: 0 10*3/uL (ref 0.0–0.1)
Basophils Relative: 0.8 % (ref 0.0–3.0)
Eosinophils Absolute: 0.2 10*3/uL (ref 0.0–0.7)
Eosinophils Relative: 4.1 % (ref 0.0–5.0)
HCT: 37.3 % (ref 36.0–46.0)
Hemoglobin: 12.5 g/dL (ref 12.0–15.0)
Lymphocytes Relative: 37.9 % (ref 12.0–46.0)
Lymphs Abs: 2 10*3/uL (ref 0.7–4.0)
MCHC: 33.5 g/dL (ref 30.0–36.0)
MCV: 91.5 fl (ref 78.0–100.0)
Monocytes Absolute: 0.4 10*3/uL (ref 0.1–1.0)
Monocytes Relative: 7.4 % (ref 3.0–12.0)
Neutro Abs: 2.7 10*3/uL (ref 1.4–7.7)
Neutrophils Relative %: 49.8 % (ref 43.0–77.0)
Platelets: 260 10*3/uL (ref 150.0–400.0)
RBC: 4.08 Mil/uL (ref 3.87–5.11)
RDW: 13.9 % (ref 11.5–15.5)
WBC: 5.4 10*3/uL (ref 4.0–10.5)

## 2013-11-19 LAB — COMPREHENSIVE METABOLIC PANEL
ALT: 26 U/L (ref 0–35)
AST: 32 U/L (ref 0–37)
Albumin: 4.1 g/dL (ref 3.5–5.2)
Alkaline Phosphatase: 104 U/L (ref 39–117)
BUN: 24 mg/dL — ABNORMAL HIGH (ref 6–23)
CO2: 29 mEq/L (ref 19–32)
Calcium: 9.1 mg/dL (ref 8.4–10.5)
Chloride: 104 mEq/L (ref 96–112)
Creatinine, Ser: 0.7 mg/dL (ref 0.4–1.2)
GFR: 83 mL/min (ref >60.00)
Glucose, Bld: 81 mg/dL (ref 70–99)
Potassium: 3.9 mEq/L (ref 3.5–5.1)
Sodium: 138 mEq/L (ref 135–145)
Total Bilirubin: 0.3 mg/dL (ref 0.2–1.2)
Total Protein: 7 g/dL (ref 6.0–8.3)

## 2013-11-20 ENCOUNTER — Other Ambulatory Visit: Payer: Self-pay | Admitting: Obstetrics and Gynecology

## 2013-11-20 DIAGNOSIS — N6452 Nipple discharge: Secondary | ICD-10-CM

## 2013-11-21 ENCOUNTER — Encounter: Payer: Self-pay | Admitting: *Deleted

## 2013-11-21 NOTE — Telephone Encounter (Signed)
Recent lab results faxed to [facility]

## 2013-11-26 ENCOUNTER — Encounter: Payer: Self-pay | Admitting: Internal Medicine

## 2013-12-02 ENCOUNTER — Encounter: Payer: Self-pay | Admitting: Internal Medicine

## 2013-12-02 ENCOUNTER — Ambulatory Visit (INDEPENDENT_AMBULATORY_CARE_PROVIDER_SITE_OTHER): Payer: Medicare Other | Admitting: Internal Medicine

## 2013-12-02 VITALS — BP 138/80 | HR 68 | Ht 62.0 in | Wt 120.0 lb

## 2013-12-02 DIAGNOSIS — K219 Gastro-esophageal reflux disease without esophagitis: Secondary | ICD-10-CM | POA: Diagnosis not present

## 2013-12-02 DIAGNOSIS — K5909 Other constipation: Secondary | ICD-10-CM

## 2013-12-02 DIAGNOSIS — K5900 Constipation, unspecified: Secondary | ICD-10-CM | POA: Diagnosis not present

## 2013-12-02 DIAGNOSIS — K3184 Gastroparesis: Secondary | ICD-10-CM

## 2013-12-02 DIAGNOSIS — D509 Iron deficiency anemia, unspecified: Secondary | ICD-10-CM

## 2013-12-02 DIAGNOSIS — Z1211 Encounter for screening for malignant neoplasm of colon: Secondary | ICD-10-CM

## 2013-12-02 DIAGNOSIS — Q438 Other specified congenital malformations of intestine: Secondary | ICD-10-CM

## 2013-12-02 DIAGNOSIS — D126 Benign neoplasm of colon, unspecified: Secondary | ICD-10-CM

## 2013-12-02 NOTE — Patient Instructions (Signed)
You have been given a form for Cologuard. [employer] will contact you to schedule shipment of the kit. Follow up based on results

## 2013-12-02 NOTE — Progress Notes (Signed)
Subjective:    Patient ID: [name], female    DOB: 04/19/1933, 80 y.o.   MRN: [id number]  HPI [name] is a 80 year old female with complicated past medical history including large sessile serrated adenoma status post right hemicolectomy in 2013, gastroparesis, GERD, CIDP, COPD, iron deficiency anemia, hypertension, hyperlipidemia, rectocele, urinary retention, polyarthritis who is seen for followup. She is here today with her husband and reports that she is doing well. She has recently had regional anesthesia for fusion of the fourth digit on her left hand. This went well. 3 weeks ago she developed a bloody discharge from her right nipple. She was seen by her GYN physician and has further imaging scheduled tomorrow at the breast center. From a GI standpoint, she continues to do well. She reports she has very rare nausea which is worse when she over eats. She is not using Reglan. She is eating 2 meals a day and reports a stable weight. No vomiting. No trouble swallowing. No trouble with significant heartburn and continues to take [place]. No abdominal pain really. She continues to use MiraLax to help her avoid constipation. When she is constipated she has left lower quadrant discomfort. This is relieved with defecation. No rectal bleeding or melena.  She has followup with [facility] in the coming weeks with her neurologist and neurosurgeon. She also has a pessary in place  She is followed by Dr. Marin Olp and had an IV iron infusion in June 2015   Review of Systems As per history of present illness, otherwise negative  Current Medications, Allergies, Past Medical History, Past Surgical History, Family History and Social History were reviewed in [employer] record.     Objective:   Physical Exam BP 138/80  Pulse 68  Ht 5\' 2"  (1.575 m)  Wt 120 lb (54.432 kg)  BMI 21.94 kg/m2 Constitutional: Well-developed and well-nourished. No distress.  HEENT: Normocephalic and  atraumatic. Oropharynx is clear and moist. No oropharyngeal exudate. Conjunctivae are normal. No scleral icterus.  Neck: Neck supple. Trachea midline.  Cardiovascular: Normal rate, regular rhythm and intact distal pulses Pulmonary/chest: Effort normal and breath sounds normal. No wheezing, rales or rhonchi.  Abdominal: Soft, nontender, nondistended. Bowel sounds active throughout.   Extremities: no clubbing, cyanosis, or edema  Neurological: Alert and oriented to person place and time.  Skin: Skin is warm and dry. No rashes noted.  Psychiatric: Normal mood and affect. Behavior is normal  CBC    Component Value Date/Time   WBC 5.4 11/19/2013 1327   WBC 5.9 10/27/2013 1532   RBC 4.08 11/19/2013 1327   RBC 4.48 10/27/2013 1532   RBC 4.68 01/30/2013 0956   HGB 12.5 11/19/2013 1327   HGB 13.5 10/27/2013 1532   HCT 37.3 11/19/2013 1327   HCT 40.2 10/27/2013 1532   PLT 260.0 11/19/2013 1327   PLT 267 10/27/2013 1532   MCV 91.5 11/19/2013 1327   MCV 90 10/27/2013 1532   MCH 30.1 10/27/2013 1532   MCH 29.6 12/06/2012 1909   MCHC 33.5 11/19/2013 1327   MCHC 33.6 10/27/2013 1532   RDW 13.9 11/19/2013 1327   RDW 13.5 10/27/2013 1532   LYMPHSABS 2.0 11/19/2013 1327   LYMPHSABS 2.0 10/27/2013 1532   MONOABS 0.4 11/19/2013 1327   EOSABS 0.2 11/19/2013 1327   EOSABS 0.2 10/27/2013 1532   BASOSABS 0.0 11/19/2013 1327   BASOSABS 0.0 10/27/2013 1532    CMP     Component Value Date/Time   NA 138 11/19/2013 1327  K 3.9 11/19/2013 1327   CL 104 11/19/2013 1327   CO2 29 11/19/2013 1327   GLUCOSE 81 11/19/2013 1327   BUN 24* 11/19/2013 1327   CREATININE 0.7 11/19/2013 1327   CREATININE 0.78 06/27/2012 1139   CALCIUM 9.1 11/19/2013 1327   PROT 7.0 11/19/2013 1327   ALBUMIN 4.1 11/19/2013 1327   AST 32 11/19/2013 1327   ALT 26 11/19/2013 1327   ALKPHOS 104 11/19/2013 1327   BILITOT 0.3 11/19/2013 1327   GFRNONAA 79* 09/03/2013 1600   GFRAA >90 09/03/2013 1600    Iron/TIBC/Ferritin/ %Sat    Component Value Date/Time   IRON 69  10/27/2013 1532   IRON 63 09/25/2012 1424   TIBC 268 10/27/2013 1532   TIBC 296 09/25/2012 1424   FERRITIN 68 10/27/2013 1532   FERRITIN 81 09/25/2012 1424   IRONPCTSAT 26 10/27/2013 1532   IRONPCTSAT 21 09/25/2012 1424       Assessment & Plan:   80 year old female with complicated past medical history including large sessile serrated adenoma status post right hemicolectomy in 2013, gastroparesis, GERD, CIDP, COPD, iron deficiency anemia, hypertension, hyperlipidemia, rectocele, urinary retention, polyarthritis who is seen for followup.  1.  History of adenomatous colon polyp requiring right hemicolectomy -- this surgery was performed for a precancerous polyp in January 2013. She is interested in surveillance, but with her complex medical history we have had a long and thorough discussion regarding colonoscopy and associated risk. We discussed other tests to help mitigate procedure risk, but I do feel colonoscopy would be safe for her if necessary. To this regard I have recommended Cologuard has a screening test. If this is negative repeat colonoscopy would not be necessary. We discussed the test and she prefers this option. It will be ordered today.  2.  Gastroparesis nausea -- under good control and remains improved. She has not required a promotility agent. She will continue small more frequent meals and avoid overeating.  3.  GERD -- well-controlled on Dexilant. Continue Dexilant 60 mg daily  4.  Iron deficiency anemia -- hemoglobin is normal, recent iron infusion. Being followed by Dr. Marin Olp  Further testing dependent on Cologuard results.

## 2013-12-03 ENCOUNTER — Ambulatory Visit
Admission: RE | Admit: 2013-12-03 | Discharge: 2013-12-03 | Disposition: A | Discharge status: Home / Self Care | Payer: Medicare Other | Source: Ambulatory Visit | Attending: Obstetrics and Gynecology | Admitting: Obstetrics and Gynecology

## 2013-12-03 ENCOUNTER — Encounter: Payer: Self-pay | Admitting: Internal Medicine

## 2013-12-03 DIAGNOSIS — N6459 Other signs and symptoms in breast: Secondary | ICD-10-CM | POA: Diagnosis not present

## 2013-12-03 DIAGNOSIS — N6452 Nipple discharge: Secondary | ICD-10-CM

## 2013-12-08 DIAGNOSIS — Z1211 Encounter for screening for malignant neoplasm of colon: Secondary | ICD-10-CM | POA: Diagnosis not present

## 2013-12-08 LAB — COLOGUARD: Cologuard: POSITIVE

## 2013-12-10 ENCOUNTER — Encounter: Payer: Self-pay | Admitting: Internal Medicine

## 2013-12-10 ENCOUNTER — Telehealth: Payer: Self-pay | Admitting: Internal Medicine

## 2013-12-10 MED ORDER — LEVOTHYROXINE SODIUM 25 MCG PO TABS: ORAL_TABLET | ORAL | Status: DC

## 2013-12-10 NOTE — Telephone Encounter (Signed)
Patient sent a e-mail stating she needs a refill on Synthroid, please clarify her dose

## 2013-12-10 NOTE — Telephone Encounter (Signed)
Pt states taking (25 mcg) 1 1/2 tablets daily. rx sent.

## 2013-12-11 ENCOUNTER — Ambulatory Visit (INDEPENDENT_AMBULATORY_CARE_PROVIDER_SITE_OTHER): Payer: Medicare Other | Admitting: Critical Care Medicine

## 2013-12-11 ENCOUNTER — Encounter: Payer: Self-pay | Admitting: Critical Care Medicine

## 2013-12-11 VITALS — BP 134/78 | HR 89 | Temp 97.8°F | Ht 62.0 in | Wt 126.0 lb

## 2013-12-11 DIAGNOSIS — R279 Unspecified lack of coordination: Secondary | ICD-10-CM | POA: Diagnosis not present

## 2013-12-11 DIAGNOSIS — J411 Mucopurulent chronic bronchitis: Secondary | ICD-10-CM | POA: Diagnosis not present

## 2013-12-11 DIAGNOSIS — Z79899 Other long term (current) drug therapy: Secondary | ICD-10-CM | POA: Diagnosis not present

## 2013-12-11 DIAGNOSIS — N63 Unspecified lump in unspecified breast: Secondary | ICD-10-CM

## 2013-12-11 DIAGNOSIS — Z85828 Personal history of other malignant neoplasm of skin: Secondary | ICD-10-CM | POA: Diagnosis not present

## 2013-12-11 DIAGNOSIS — Z23 Encounter for immunization: Secondary | ICD-10-CM | POA: Diagnosis not present

## 2013-12-11 DIAGNOSIS — Z9181 History of falling: Secondary | ICD-10-CM | POA: Insufficient documentation

## 2013-12-11 DIAGNOSIS — N6310 Unspecified lump in the right breast, unspecified quadrant: Secondary | ICD-10-CM | POA: Insufficient documentation

## 2013-12-11 DIAGNOSIS — G6181 Chronic inflammatory demyelinating polyneuritis: Secondary | ICD-10-CM | POA: Diagnosis not present

## 2013-12-11 DIAGNOSIS — R292 Abnormal reflex: Secondary | ICD-10-CM | POA: Diagnosis not present

## 2013-12-11 MED ORDER — MONTELUKAST SODIUM 10 MG PO TABS: 10.0000 mg | ORAL_TABLET | Freq: Every day | ORAL | Status: DC

## 2013-12-11 MED ORDER — ARFORMOTEROL TARTRATE 15 MCG/2ML IN NEBU: 2.0000 mL | INHALATION_SOLUTION | Freq: Two times a day (BID) | RESPIRATORY_TRACT | Status: DC

## 2013-12-11 MED ORDER — BUDESONIDE 0.25 MG/2ML IN SUSP: 0.2500 mg | Freq: Two times a day (BID) | RESPIRATORY_TRACT | Status: DC

## 2013-12-11 MED ORDER — MOMETASONE FUROATE 50 MCG/ACT NA SUSP: NASAL | Status: DC

## 2013-12-11 NOTE — Progress Notes (Signed)
Subjective:    Patient ID: [name], female    DOB: 07/16/1932, 80 y.o.   MRN: [id number]  HPI Comments: I   80 y.o. [facility]  12/11/2013 Chief Complaint  Patient presents with  . Follow-up    Last seen Jan 2014.  Breathing is doing well overall.  Does cough every night and occas during the day.  Cough is nonprod.  No SOB, wheezing, or chest tightness/pain.  Doing well with dyspnea, cough is dry.  No wheezing. No chest pain. CIDP balance issues persist.  Pt has had C4-5-6 fused. Hands still numb as are the feet.  Balance is worse. Pt uses the cane more.  Bowel and bladder is an issue.   Notes new blood out of Right nipple.  Mammogram/duct injection: mass in R breast.    Pt with neuropathy issues but tegretol has helped. Pt has had falls, two nights ago fell off chair Review of Systems  Constitutional: Negative for chills, diaphoresis, activity change, appetite change, fatigue and unexpected weight change.  HENT: Positive for congestion and hearing loss. Negative for dental problem, ear discharge, facial swelling, mouth sores, nosebleeds, postnasal drip, sinus pressure, sneezing, tinnitus, trouble swallowing and voice change.   Eyes: Negative for photophobia, discharge, itching and visual disturbance.  Respiratory: Positive for cough and chest tightness. Negative for apnea, choking and stridor.   Cardiovascular: Negative for palpitations.  Gastrointestinal: Positive for constipation and blood in stool. Negative for nausea and abdominal distention.       Hx of rectocoele   Genitourinary: Positive for urgency, frequency and decreased urine volume. Negative for dysuria, hematuria, flank pain and difficulty urinating.  Musculoskeletal: Positive for arthralgias, back pain, gait problem, joint swelling and neck stiffness. Negative for myalgias.  Skin: Negative for color change and pallor.  Neurological: Positive for weakness and numbness. Negative for dizziness, tremors, seizures, syncope,  speech difficulty and light-headedness.  Hematological: Negative for adenopathy. Bruises/bleeds easily.  Psychiatric/Behavioral: Negative for confusion, sleep disturbance and agitation. The patient is not nervous/anxious.        Objective:   Physical Exam  Filed Vitals:   12/11/13 0912  BP: 134/78  Pulse: 89  Temp: 97.8 F (36.6 C)  TempSrc: Oral  Height: 5\' 2"  (1.575 m)  Weight: 126 lb (57.153 kg)  SpO2: 99%    Gen: Pleasant, well-nourished, in no distress,  normal affect  ENT: No lesions,  mouth clear,  oropharynx clear, no postnasal drip  Neck: No JVD, no TMG, no carotid bruits  Lungs: No use of accessory muscles, no dullness to percussion, distant BS  Cardiovascular: RRR, heart sounds normal, no murmur or gallops, no peripheral edema  Abdomen: soft and NT, no HSM,  BS normal  Musculoskeletal: No deformities, no cyanosis or clubbing  Neuro: alert, non focal  Skin: Warm, no lesions or rashes     Assessment & Plan:   COPD (chronic obstructive pulmonary disease) gold stage D. Copd Golds D non oxygen dependent Plan Prevnar 13 was given Stop theophylline Consider a rollalator for ambulation long distance as needed or even in home use Refills on meds sent Return 6 months      Updated Medication List Outpatient Encounter Prescriptions as of 12/11/2013  Medication Sig  . amLODipine (NORVASC) 10 MG tablet Take 1 tablet (10 mg total) by mouth every morning.  Marland Kitchen amphetamine-dextroamphetamine (ADDERALL) 10 MG tablet Take 1 tablet (10 mg total) by mouth 4 (four) times daily. May  . arformoterol (BROVANA) 15 MCG/2ML NEBU Take 2 mLs (  15 mcg total) by nebulization 2 (two) times daily.  . Ascorbic Acid (VITAMIN C PO) Take 2 capsules by mouth daily.  Marland Kitchen atorvastatin (LIPITOR) 40 MG tablet Take 1 tablet (40 mg total) by mouth daily.  . budesonide (PULMICORT) 0.25 MG/2ML nebulizer solution Take 2 mLs (0.25 mg total) by nebulization 2 (two) times daily.  Marland Kitchen CALCIUM-VITAMIN D  PO Take 1 tablet by mouth daily.   . carbamazepine (TEGRETOL XR) 100 MG 12 hr tablet Take 100 mg by mouth 2 (two) times daily.   Marland Kitchen denosumab (PROLIA) 60 MG/ML SOLN injection Inject 60 mg into the skin every 6 (six) months. Administer in upper arm, thigh, or abdomen  . dexlansoprazole (DEXILANT) 60 MG capsule Take 60 mg by mouth every other day.  . levothyroxine (SYNTHROID, LEVOTHROID) 25 MCG tablet Take 1 1/2 tablet daily.  Marland Kitchen lidocaine (LIDODERM) 5 % Place 2 patches onto the skin as needed. Marland Kitchen Applies to back and neck.  . mometasone (ELOCON) 0.1 % cream Apply 1 application topically daily.  . mometasone (NASONEX) 50 MCG/ACT nasal spray PLACE 2 SPRAYS INTO THE NOSE DAILY  . montelukast (SINGULAIR) 10 MG tablet Take 1 tablet (10 mg total) by mouth at bedtime.  . ondansetron (ZOFRAN-ODT) 4 MG disintegrating tablet Take 4 mg by mouth 2 (two) times daily as needed.  Marland Kitchen PHENobarbital (LUMINAL) 32.4 MG tablet Take 32.4-64.8 mg by mouth 4 (four) times daily -  with meals and at bedtime. one tablet at 6 am, one tablet at noon,  two tabletss at 8 pm.  . polyethylene glycol (MIRALAX / GLYCOLAX) packet Take 17 g by mouth at bedtime.  . predniSONE (DELTASONE) 20 MG tablet Take 20 mg by mouth daily.  Marland Kitchen PROAIR HFA 108 (90 BASE) MCG/ACT inhaler Inhale 1 puff into the lungs as needed.   . Vitamin D, Ergocalciferol, (DRISDOL) 50000 UNITS CAPS capsule TAKE 1 CAPSULE EVERY WEEK  . [DISCONTINUED] arformoterol (BROVANA) 15 MCG/2ML NEBU Take 2 mLs (15 mcg total) by nebulization 2 (two) times daily.  . [DISCONTINUED] budesonide (PULMICORT) 0.25 MG/2ML nebulizer solution Take 0.25 mg by nebulization 2 (two) times daily.  . [DISCONTINUED] montelukast (SINGULAIR) 10 MG tablet Take 1 tablet (10 mg total) by mouth at bedtime.  . [DISCONTINUED] NASONEX 50 MCG/ACT nasal spray PLACE 2 SPRAYS INTO THE NOSE DAILY  . [DISCONTINUED] ondansetron (ZOFRAN-ODT) 4 MG disintegrating tablet Take 1 tablet (4 mg total) by mouth 3 (three) times  daily.  . [DISCONTINUED] theophylline (THEODUR) 100 MG 12 hr tablet Take 100 mg by mouth every morning.  . diclofenac sodium (VOLTAREN) 1 % GEL Apply 1 application topically 4 (four) times daily as needed. Applies to fingers and hands for gout.  . Esterified Estrogens (MENEST) 0.3 MG tablet ON HOLD

## 2013-12-11 NOTE — Patient Instructions (Signed)
Prevnar 13 was given Stop theophylline Consider a rollalator for ambulation long distance as needed or even in home use Refills on meds sent Return 6 months

## 2013-12-11 NOTE — Assessment & Plan Note (Signed)
Copd Golds D non oxygen dependent Plan Prevnar 13 was given Stop theophylline Consider a rollalator for ambulation long distance as needed or even in home use Refills on meds sent Return 6 months

## 2013-12-12 ENCOUNTER — Ambulatory Visit (INDEPENDENT_AMBULATORY_CARE_PROVIDER_SITE_OTHER): Payer: Medicare Other | Admitting: Internal Medicine

## 2013-12-12 ENCOUNTER — Encounter: Payer: Self-pay | Admitting: Internal Medicine

## 2013-12-12 VITALS — BP 130/74 | HR 87 | Temp 98.2°F | Wt 126.0 lb

## 2013-12-12 DIAGNOSIS — G6181 Chronic inflammatory demyelinating polyneuritis: Secondary | ICD-10-CM | POA: Diagnosis not present

## 2013-12-12 DIAGNOSIS — M810 Age-related osteoporosis without current pathological fracture: Secondary | ICD-10-CM | POA: Diagnosis not present

## 2013-12-12 DIAGNOSIS — N6310 Unspecified lump in the right breast, unspecified quadrant: Secondary | ICD-10-CM

## 2013-12-12 DIAGNOSIS — J411 Mucopurulent chronic bronchitis: Secondary | ICD-10-CM

## 2013-12-12 MED ORDER — DENOSUMAB 60 MG/ML ~~LOC~~ SOLN
60.0000 mg | Freq: Once | SUBCUTANEOUS | Status: AC
  Administered 2013-12-12: 60 mg via SUBCUTANEOUS

## 2013-12-12 NOTE — Assessment & Plan Note (Signed)
prolia today, next 6 months

## 2013-12-12 NOTE — Progress Notes (Signed)
Subjective:    Patient ID: [name], female    DOB: May 23, 1933, 80 y.o.   MRN: [id number]  DOS:  12/12/2013 Type of visit - description: Routine History:  COPD, saw Dr Joya Gaskins, medications were adjusted, she's feeling great CIPD, on Tegretol, symptoms significantly decreased. Since the last time she was here, she has a right breast mass, awaiting be seen by surgery. Osteoporosis, due for a prolia injection   ROS Denies chest pain or difficulty breathing, no other extremity edema Denies anxiety or depression at this time  Past Medical History  Diagnosis Date  . Anemia   . Anxiety -depression     patient denied  . Asthma -COPD   . Chronic fatigue syndrome   . GERD (gastroesophageal reflux disease)   . Hyperlipidemia   . Hypertension   . Colon polyp   . Allergic rhinitis   . Vitamin D deficiency   . Pulmonary embolism     1980s  . Neuropathy   . Arthritis   . 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 Blood transfusion   . Osteoporosis   . Hypothyroid   . Constipation   . Family history of anesthesia complication     Daughter with history of malignant hyperthermia  . Seizures     1990 last seizures on meds Phenobarb  . Skin cancer     squamous cell  . Neurogenic bladder 2013    husband caths pt  . chronic sinusitis 08/08/2012  . Shingles late 80's  . Cystocele 09/16/2012  . Tubular adenoma   . Breast mass     Past Surgical History  Procedure Laterality Date  . Tonsillectomy and adenoidectomy    . Nasal septum surgery    . Dilation and curettage of uterus    . Tubal ligation    . Cesarean section    . Sinus surgeries      x 4  . Left ovary and tube removed    . Vocal polyps removed    . Appendectomy    . Cystocele repair    . Carpal tunnel release      right  . Shoulder arthroscopy      x2 left, 1 right  . Left finger fusion    . Right median nerve decompression    . Duptyren's contracture right hand    . Finger ganglion  cyst excision      right  . Abdominal hysterectomy    . Joint replacement      right and left basal joints of thumbs  . Bladder suspension    . Cataract extraction      bilateral  . Cervical neck ablation      x 7, C3-C4  . Squamous lesions removed      neck and face  . Basal cell carcinoma excision      face  . Panniculectomy    . Nasal polyp surgery      4  . Anterior fusion clivus-c2 extraoral w/ odontoid excision  8/14  . Proximal interphalangeal fusion (pip) Left 09/09/2013    Procedure: FUSION LEFT INDEX PROXIMAL INTERPHALANGEAL JOINT (PIP);  Surgeon: Cammie Sickle., MD;  Location: [facility];  Service: Orthopedics;  Laterality: Left;    History   Social History  . Marital Status: Married    Spouse Name: N/A    Number of Children: 3  . Years of Education: N/A   Occupational History  . retired, Licensed conveyancer  Social History Main Topics  . Smoking status: Former Smoker -- 0.50 packs/day for 3 years    Types: Cigarettes    Start date: 03/30/1975    Quit date: 05/29/1978  . Smokeless tobacco: Never Used     Comment: quit smoking 35 years ago  . Alcohol Use: No  . Drug Use: No  . Sexual Activity: Yes   Other Topics Concern  . Not on file   Social History Narrative   Pt lives at home with her spouse.   Moved to [place] ~ 2004        Medication List       This list is accurate as of: 12/12/13  1:41 PM.  Always use your most recent med list.               amLODipine 10 MG tablet  Commonly known as:  NORVASC  Take 1 tablet (10 mg total) by mouth every morning.     amphetamine-dextroamphetamine 10 MG tablet  Commonly known as:  ADDERALL  Take 1 tablet (10 mg total) by mouth 4 (four) times daily. May     arformoterol 15 MCG/2ML Nebu  Commonly known as:  BROVANA  Take 2 mLs (15 mcg total) by nebulization 2 (two) times daily.     atorvastatin 40 MG tablet  Commonly known as:  LIPITOR  Take 1 tablet (40 mg total) by mouth daily.      budesonide 0.25 MG/2ML nebulizer solution  Commonly known as:  PULMICORT  Take 2 mLs (0.25 mg total) by nebulization 2 (two) times daily.     CALCIUM-VITAMIN D PO  Take 1 tablet by mouth daily.     carbamazepine 100 MG 12 hr tablet  Commonly known as:  TEGRETOL XR  Take 100 mg by mouth 2 (two) times daily.     denosumab 60 MG/ML Soln injection  Commonly known as:  PROLIA  Inject 60 mg into the skin every 6 (six) months. Administer in upper arm, thigh, or abdomen     dexlansoprazole 60 MG capsule  Commonly known as:  DEXILANT  Take 60 mg by mouth every other day.     levothyroxine 25 MCG tablet  Commonly known as:  SYNTHROID, LEVOTHROID  Take 1 1/2 tablet daily.     lidocaine 5 %  Commonly known as:  LIDODERM  Place 2 patches onto the skin as needed. Marland Kitchen Applies to back and neck.     MENEST 0.3 MG tablet  Generic drug:  Esterified Estrogens  ON HOLD     mometasone 0.1 % cream  Commonly known as:  ELOCON  Apply 1 application topically daily.     mometasone 50 MCG/ACT nasal spray  Commonly known as:  NASONEX  PLACE 2 SPRAYS INTO THE NOSE DAILY     montelukast 10 MG tablet  Commonly known as:  SINGULAIR  Take 1 tablet (10 mg total) by mouth at bedtime.     ondansetron 4 MG disintegrating tablet  Commonly known as:  ZOFRAN-ODT  Take 4 mg by mouth 2 (two) times daily as needed.     PHENobarbital 32.4 MG tablet  Commonly known as:  LUMINAL  Take 32.4-64.8 mg by mouth 4 (four) times daily -  with meals and at bedtime. one tablet at 6 am, one tablet at noon,  two tabletss at 8 pm.     polyethylene glycol packet  Commonly known as:  MIRALAX / GLYCOLAX  Take 17 g by mouth at bedtime.  predniSONE 20 MG tablet  Commonly known as:  DELTASONE  Take 20 mg by mouth daily.     PROAIR HFA 108 (90 BASE) MCG/ACT inhaler  Generic drug:  albuterol  Inhale 1 puff into the lungs as needed.     VITAMIN C PO  Take 2 capsules by mouth daily.     Vitamin D (Ergocalciferol)  50000 UNITS Caps capsule  Commonly known as:  DRISDOL  TAKE 1 CAPSULE EVERY WEEK     VOLTAREN 1 % Gel  Generic drug:  diclofenac sodium  Apply 1 application topically 4 (four) times daily as needed. Applies to fingers and hands for gout.           Objective:   Physical Exam BP 130/74  Pulse 87  Temp(Src) 98.2 F (36.8 C)  Wt 126 lb (57.153 kg)  SpO2 97% General -- alert, well-developed, NAD.    Lungs -- normal respiratory effort, no intercostal retractions, no accessory muscle use, and decreased breath sounds.  Heart-- normal rate, regular rhythm, no murmur.   Extremities-- no pretibial edema bilaterally  Neurologic--  alert & oriented X3.  Psych-- Cognition and judgment appear intact. Cooperative with normal attention span and concentration. No anxious or depressed appearing.        Assessment & Plan:

## 2013-12-12 NOTE — Progress Notes (Signed)
Pre visit review using our clinic review tool, if applicable. No additional management support is needed unless otherwise documented below in the visit note. 

## 2013-12-12 NOTE — Assessment & Plan Note (Signed)
Pending surgical eval for a R Breast mass and bloody niple d/c

## 2013-12-12 NOTE — Patient Instructions (Signed)
Come back for a CBC and CMP (DX neuropathy) in:   2 weeks, 4 weeks, 6 weeks and 8 weeks. Please make appointments    the next PROLIA injection will be 6 months from today   Next visit to see me is in 4 months

## 2013-12-12 NOTE — Assessment & Plan Note (Addendum)
States that plans to see Dr. Joya Gaskins regularly and see Dr. Verdie Mosher for exacerbations. Medications were recently adjusted and she feels very well.

## 2013-12-13 NOTE — Assessment & Plan Note (Signed)
On Tegretol, prescribed at [facility], sx quite improved.CBC and CMP yesterday satisfactory, will need CBC and CMP every 2 weeks x4

## 2013-12-15 ENCOUNTER — Encounter: Payer: Self-pay | Admitting: Internal Medicine

## 2013-12-15 ENCOUNTER — Other Ambulatory Visit: Payer: Self-pay | Admitting: *Deleted

## 2013-12-15 MED ORDER — AMLODIPINE BESYLATE 10 MG PO TABS: 10.0000 mg | ORAL_TABLET | Freq: Every morning | ORAL | Status: DC

## 2013-12-15 NOTE — Addendum Note (Signed)
Addended by: Peggyann Shoals on: 12/15/2013 11:52 AM   Modules accepted: Orders

## 2013-12-17 ENCOUNTER — Telehealth: Payer: Self-pay | Admitting: *Deleted

## 2013-12-17 NOTE — Telephone Encounter (Signed)
Dr Hilarie Fredrickson has received positive cologuard report on patient. He would like patient to have a colonoscopy (1 hour slot, 2 day prep). He states that patient is dealing with recent breast abnormality requiring further workup and this should not interfere with that evaluation. I have left a message for patient to call back.

## 2013-12-18 NOTE — Telephone Encounter (Signed)
Left message for patient to call back  

## 2013-12-19 ENCOUNTER — Ambulatory Visit: Payer: Medicare Other | Admitting: Internal Medicine

## 2013-12-22 NOTE — Telephone Encounter (Signed)
I have spoken to [name] and have advised her of positive cologuard test. She has scheduled colonoscopy with Dr Hilarie Fredrickson on 02/03/14 @ 2 pm (1 hour slot) and she is scheduled for previsit on 01/15/14 @ 1:00 pm. She verbalizes understanding.

## 2013-12-25 DIAGNOSIS — R0602 Shortness of breath: Secondary | ICD-10-CM | POA: Diagnosis not present

## 2013-12-25 DIAGNOSIS — J449 Chronic obstructive pulmonary disease, unspecified: Secondary | ICD-10-CM | POA: Diagnosis not present

## 2013-12-26 ENCOUNTER — Telehealth: Payer: Self-pay | Admitting: Internal Medicine

## 2013-12-26 ENCOUNTER — Other Ambulatory Visit (INDEPENDENT_AMBULATORY_CARE_PROVIDER_SITE_OTHER): Payer: Medicare Other

## 2013-12-26 DIAGNOSIS — M19039 Primary osteoarthritis, unspecified wrist: Secondary | ICD-10-CM | POA: Diagnosis not present

## 2013-12-26 DIAGNOSIS — Z79899 Other long term (current) drug therapy: Secondary | ICD-10-CM | POA: Diagnosis not present

## 2013-12-26 LAB — CBC WITH DIFFERENTIAL/PLATELET
Basophils Absolute: 0 10*3/uL (ref 0.0–0.1)
Basophils Relative: 0.4 % (ref 0.0–3.0)
Eosinophils Absolute: 0.2 10*3/uL (ref 0.0–0.7)
Eosinophils Relative: 3.5 % (ref 0.0–5.0)
HCT: 40.4 % (ref 36.0–46.0)
Hemoglobin: 13.3 g/dL (ref 12.0–15.0)
Lymphocytes Relative: 32.5 % (ref 12.0–46.0)
Lymphs Abs: 1.9 10*3/uL (ref 0.7–4.0)
MCHC: 33 g/dL (ref 30.0–36.0)
MCV: 93.1 fl (ref 78.0–100.0)
Monocytes Absolute: 0.2 10*3/uL (ref 0.1–1.0)
Monocytes Relative: 4.1 % (ref 3.0–12.0)
Neutro Abs: 3.4 10*3/uL (ref 1.4–7.7)
Neutrophils Relative %: 59.5 % (ref 43.0–77.0)
Platelets: 255 10*3/uL (ref 150.0–400.0)
RBC: 4.34 Mil/uL (ref 3.87–5.11)
RDW: 14.3 % (ref 11.5–15.5)
WBC: 5.8 10*3/uL (ref 4.0–10.5)

## 2013-12-26 LAB — COMPREHENSIVE METABOLIC PANEL
ALT: 27 U/L (ref 0–35)
AST: 34 U/L (ref 0–37)
Albumin: 4.1 g/dL (ref 3.5–5.2)
Alkaline Phosphatase: 108 U/L (ref 39–117)
BUN: 16 mg/dL (ref 6–23)
CO2: 26 mEq/L (ref 19–32)
Calcium: 8.2 mg/dL — ABNORMAL LOW (ref 8.4–10.5)
Chloride: 104 mEq/L (ref 96–112)
Creatinine, Ser: 0.7 mg/dL (ref 0.4–1.2)
GFR: 93.37 mL/min (ref >60.00)
Glucose, Bld: 118 mg/dL — ABNORMAL HIGH (ref 70–99)
Potassium: 3.8 mEq/L (ref 3.5–5.1)
Sodium: 137 mEq/L (ref 135–145)
Total Bilirubin: 0.5 mg/dL (ref 0.2–1.2)
Total Protein: 7.3 g/dL (ref 6.0–8.3)

## 2013-12-26 NOTE — Telephone Encounter (Signed)
Pt came in for labs today that have been ordered by Dr. Venetia Maxon at [facility].  Pt brought her paperwork outlining when labs are to be drawn over the next few months.  Per pt, her anti-seizure medicine has been increased by 200 mg more daily.  Lab results from each visit will need to be faxed to Dr. Venetia Maxon at [facility] at [phone].

## 2013-12-29 ENCOUNTER — Encounter (INDEPENDENT_AMBULATORY_CARE_PROVIDER_SITE_OTHER): Payer: Self-pay | Admitting: General Surgery

## 2013-12-29 ENCOUNTER — Ambulatory Visit (INDEPENDENT_AMBULATORY_CARE_PROVIDER_SITE_OTHER): Payer: Medicare Other | Admitting: General Surgery

## 2013-12-29 VITALS — BP 112/68 | HR 84 | Temp 97.8°F | Resp 18 | Ht 62.0 in | Wt 126.8 lb

## 2013-12-29 DIAGNOSIS — N6452 Nipple discharge: Secondary | ICD-10-CM

## 2013-12-29 DIAGNOSIS — N6459 Other signs and symptoms in breast: Secondary | ICD-10-CM

## 2013-12-29 HISTORY — DX: Nipple discharge: N64.52

## 2013-12-29 NOTE — Progress Notes (Signed)
Chief Complaint  Patient presents with  . Breast Mass    new pt- eval breast papilloma    Referring MD: Miquel Dunn, MD  HISTORY:  Patient is a 80 year old female who I took care of previously for a cecal adenoma. She presents with a several month history of bloody nipple discharge. She has not been able to palpate a mass. She has been up-to-date with her mammograms. She underwent ductogram demonstrating a filling defect approximately 2 cm from the nipple at the 12:00 position. She has no history of cancer. She did recently have a positive ColoGuard test and is due for another colonoscopy.  She does not have any breast pain. She did have a sister with ovarian cancer.  Past Medical History  Diagnosis Date  . Anemia   . Anxiety -depression     patient denied  . Asthma -COPD   . Chronic fatigue syndrome   . GERD (gastroesophageal reflux disease)   . Hyperlipidemia   . Hypertension   . Colon polyp   . Allergic rhinitis   . Vitamin D deficiency   . Pulmonary embolism     1980s  . Neuropathy   . Arthritis   . 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 Blood transfusion   . Osteoporosis   . Hypothyroid   . Constipation   . Family history of anesthesia complication     Daughter with history of malignant hyperthermia  . Seizures     1990 last seizures on meds Phenobarb  . Skin cancer     squamous cell  . Neurogenic bladder 2013    husband caths pt  . chronic sinusitis 08/08/2012  . Shingles late 69's  . Cystocele 09/16/2012  . Tubular adenoma   . Breast mass     Past Surgical History  Procedure Laterality Date  . Tonsillectomy and adenoidectomy    . Nasal septum surgery    . Dilation and curettage of uterus    . Tubal ligation    . Cesarean section    . Sinus surgeries      x 4  . Left ovary and tube removed    . Vocal polyps removed    . Appendectomy    . Cystocele repair    . Carpal tunnel release      right  . Shoulder arthroscopy      x2 left, 1  right  . Left finger fusion    . Right median nerve decompression    . Duptyren's contracture right hand    . Finger ganglion cyst excision      right  . Abdominal hysterectomy    . Joint replacement      right and left basal joints of thumbs  . Bladder suspension    . Cataract extraction      bilateral  . Cervical neck ablation      x 7, C3-C4  . Squamous lesions removed      neck and face  . Basal cell carcinoma excision      face  . Panniculectomy    . Nasal polyp surgery      4  . Anterior fusion clivus-c2 extraoral w/ odontoid excision  8/14  . Proximal interphalangeal fusion (pip) Left 09/09/2013    Procedure: FUSION LEFT INDEX PROXIMAL INTERPHALANGEAL JOINT (PIP);  Surgeon: Cammie Sickle., MD;  Location: [facility];  Service: Orthopedics;  Laterality: Left;    Current Outpatient Prescriptions  Medication Sig Dispense Refill  . amLODipine (  NORVASC) 10 MG tablet Take 1 tablet (10 mg total) by mouth every morning.  90 tablet  2  . amphetamine-dextroamphetamine (ADDERALL) 10 MG tablet Take 1 tablet (10 mg total) by mouth 4 (four) times daily. May  120 tablet  0  . arformoterol (BROVANA) 15 MCG/2ML NEBU Take 2 mLs (15 mcg total) by nebulization 2 (two) times daily.  120 mL  6  . Ascorbic Acid (VITAMIN C PO) Take 2 capsules by mouth daily.      Marland Kitchen atorvastatin (LIPITOR) 40 MG tablet Take 1 tablet (40 mg total) by mouth daily.  90 tablet  2  . budesonide (PULMICORT) 0.25 MG/2ML nebulizer solution Take 2 mLs (0.25 mg total) by nebulization 2 (two) times daily.  60 mL  6  . CALCIUM-VITAMIN D PO Take 1 tablet by mouth daily.       . carbamazepine (TEGRETOL XR) 100 MG 12 hr tablet Take 100 mg by mouth 2 (two) times daily.       Marland Kitchen denosumab (PROLIA) 60 MG/ML SOLN injection Inject 60 mg into the skin every 6 (six) months. Administer in upper arm, thigh, or abdomen      . dexlansoprazole (DEXILANT) 60 MG capsule Take 60 mg by mouth every other day.      . diclofenac  sodium (VOLTAREN) 1 % GEL Apply 1 application topically 4 (four) times daily as needed. Applies to fingers and hands for gout.      . Esterified Estrogens (MENEST) 0.3 MG tablet ON HOLD      . levothyroxine (SYNTHROID, LEVOTHROID) 25 MCG tablet Take 1 1/2 tablet daily.  45 tablet  3  . lidocaine (LIDODERM) 5 % Place 2 patches onto the skin as needed. Marland Kitchen Applies to back and neck.      . mometasone (ELOCON) 0.1 % cream Apply 1 application topically daily.      . mometasone (NASONEX) 50 MCG/ACT nasal spray PLACE 2 SPRAYS INTO THE NOSE DAILY  17 g  6  . montelukast (SINGULAIR) 10 MG tablet Take 1 tablet (10 mg total) by mouth at bedtime.  90 tablet  4  . ondansetron (ZOFRAN-ODT) 4 MG disintegrating tablet Take 4 mg by mouth 2 (two) times daily as needed.      Marland Kitchen PHENobarbital (LUMINAL) 32.4 MG tablet Take 32.4-64.8 mg by mouth 4 (four) times daily -  with meals and at bedtime. one tablet at 6 am, one tablet at noon,  two tabletss at 8 pm.      . polyethylene glycol (MIRALAX / GLYCOLAX) packet Take 17 g by mouth at bedtime.  14 each    . predniSONE (DELTASONE) 20 MG tablet Take 20 mg by mouth daily.      Marland Kitchen PROAIR HFA 108 (90 BASE) MCG/ACT inhaler Inhale 1 puff into the lungs as needed.       . Vitamin D, Ergocalciferol, (DRISDOL) 50000 UNITS CAPS capsule TAKE 1 CAPSULE EVERY WEEK  12 capsule  3   No current facility-administered medications for this visit.     Allergies  Allergen Reactions  . Iodine Other (See Comments)    Cardiac arrest  . Morphine And Related Other (See Comments)    Cardiac arrest     Family History  Problem Relation Age of Onset  . Heart disease Mother   . Hyperlipidemia Mother     Jerilynn Mages, aunt  . Allergies Sister   . Ovarian cancer Sister   . Lung cancer Sister     lung - stage 1  .  Thyroid cancer Sister   . Colon cancer Father   . Stomach cancer Other     first cousin  . Skin cancer Daughter   . Malignant hyperthermia Daughter      History   Social History  .  Marital Status: Married    Spouse Name: N/A    Number of Children: 3  . Years of Education: N/A   Occupational History  . retired, Licensed conveyancer    Social History Main Topics  . Smoking status: Former Smoker -- 0.50 packs/day for 3 years    Types: Cigarettes    Start date: 03/30/1975    Quit date: 05/29/1978  . Smokeless tobacco: Never Used     Comment: quit smoking 35 years ago  . Alcohol Use: No  . Drug Use: No  . Sexual Activity: Yes   Other Topics Concern  . None   Social History Narrative   Pt lives at home with her spouse.   Moved to [place] ~ 2004     REVIEW OF SYSTEMS - PERTINENT POSITIVES ONLY: 12 point review of systems negative other than HPI and PMH   EXAM: Filed Vitals:   12/29/13 0901  BP: 112/68  Pulse: 84  Temp: 97.8 F (36.6 C)  Resp: 18    Wt Readings from Last 3 Encounters:  12/29/13 126 lb 12.8 oz (57.516 kg)  12/12/13 126 lb (57.153 kg)  12/11/13 126 lb (57.153 kg)     Gen:  No acute distress.  Well nourished and well groomed.   Neurological: Alert and oriented to person, place, and time. Coordination normal.  Head: Normocephalic and atraumatic.  Eyes: Conjunctivae are normal. Pupils are equal, round, and reactive to light. No scleral icterus.  Neck: Normal range of motion. Neck supple. No tracheal deviation or thyromegaly present.  Cardiovascular: Normal rate, regular rhythm, normal heart sounds and intact distal pulses.  Exam reveals no gallop and no friction rub.  No murmur heard. Breast: right bloody nipple discharge.  Spontaneous and reproducible.  No masses were palpable.  Dense breast tissue for age.   Respiratory: Effort normal.  No respiratory distress. No chest wall tenderness. Breath sounds normal.  No wheezes, rales or rhonchi.  GI: Soft. Bowel sounds are normal. The abdomen is soft and nontender.  There is no rebound and no guarding.  Musculoskeletal: Normal range of motion. Extremities are nontender.  Lymphadenopathy: No  cervical, preauricular, postauricular or axillary adenopathy is present Skin: Skin is warm and dry. No rash noted. No diaphoresis. No erythema. No pallor. No clubbing, cyanosis, or edema.   Psychiatric: Normal mood and affect. Behavior is normal. Judgment and thought content normal.    LABORATORY RESULTS: Available labs are reviewed   Recent Results (from the past 2160 hour(s))  CBC WITH DIFFERENTIAL ([facility])     Status: None   Collection Time    10/27/13  3:32 PM      Result Value Ref Range   WBC 5.9  3.9 - 10.0 10e3/uL   RBC 4.48  3.70 - 5.32 10e6/uL   HGB 13.5  11.6 - 15.9 g/dL   HCT 40.2  34.8 - 46.6 %   MCV 90  81 - 101 fL   MCH 30.1  26.0 - 34.0 pg   MCHC 33.6  32.0 - 36.0 g/dL   RDW 13.5  11.1 - 15.7 %   Platelets 267  145 - 400 10e3/uL   NEUT# 3.1  1.5 - 6.5 10e3/uL   LYMPH# 2.0  0.9 - 3.3 10e3/uL   MONO# 0.5  0.1 - 0.9 10e3/uL   Eosinophils Absolute 0.2  0.0 - 0.5 10e3/uL   BASO# 0.0  0.0 - 0.2 10e3/uL   NEUT% 53.3  39.6 - 80.0 %   LYMPH% 33.7  14.0 - 48.0 %   MONO% 8.8  0.0 - 13.0 %   EOS% 3.7  0.0 - 7.0 %   BASO% 0.5  0.0 - 2.0 %  IRON AND TIBC [facility]     Status: None   Collection Time    10/27/13  3:32 PM      Result Value Ref Range   Iron 69  41 - 142 ug/dL   TIBC 268  236 - 444 ug/dL   UIBC 198  120 - 384 ug/dL   %SAT 26  21 - 57 %  FERRITIN [facility]     Status: None   Collection Time    10/27/13  3:32 PM      Result Value Ref Range   Ferritin 68  9 - 269 ng/ml  [facility] - SMEAR     Status: None   Collection Time    10/27/13  3:32 PM      Result Value Ref Range   Smear Result Smear Available    CBC WITH DIFFERENTIAL     Status: None   Collection Time    11/19/13  1:27 PM      Result Value Ref Range   WBC 5.4  4.0 - 10.5 K/uL   RBC 4.08  3.87 - 5.11 Mil/uL   Hemoglobin 12.5  12.0 - 15.0 g/dL   HCT 37.3  36.0 - 46.0 %   MCV 91.5  78.0 - 100.0 fl   MCHC 33.5  30.0 - 36.0 g/dL   RDW 13.9  11.5 - 15.5 %   Platelets 260.0  150.0 - 400.0  K/uL   Neutrophils Relative % 49.8  43.0 - 77.0 %   Lymphocytes Relative 37.9  12.0 - 46.0 %   Monocytes Relative 7.4  3.0 - 12.0 %   Eosinophils Relative 4.1  0.0 - 5.0 %   Basophils Relative 0.8  0.0 - 3.0 %   Neutro Abs 2.7  1.4 - 7.7 K/uL   Lymphs Abs 2.0  0.7 - 4.0 K/uL   Monocytes Absolute 0.4  0.1 - 1.0 K/uL   Eosinophils Absolute 0.2  0.0 - 0.7 K/uL   Basophils Absolute 0.0  0.0 - 0.1 K/uL  COMPREHENSIVE METABOLIC PANEL     Status: Abnormal   Collection Time    11/19/13  1:27 PM      Result Value Ref Range   Sodium 138  135 - 145 mEq/L   Potassium 3.9  3.5 - 5.1 mEq/L   Chloride 104  96 - 112 mEq/L   CO2 29  19 - 32 mEq/L   Glucose, Bld 81  70 - 99 mg/dL   BUN 24 (*) 6 - 23 mg/dL   Creatinine, Ser 0.7  0.4 - 1.2 mg/dL   Total Bilirubin 0.3  0.2 - 1.2 mg/dL   Alkaline Phosphatase 104  39 - 117 U/L   AST 32  0 - 37 U/L   ALT 26  0 - 35 U/L   Total Protein 7.0  6.0 - 8.3 g/dL   Albumin 4.1  3.5 - 5.2 g/dL   Calcium 9.1  8.4 - 10.5 mg/dL   GFR 83.00  >60.00 mL/min  CBC WITH DIFFERENTIAL  Status: None   Collection Time    12/26/13  1:39 PM      Result Value Ref Range   WBC 5.8  4.0 - 10.5 K/uL   RBC 4.34  3.87 - 5.11 Mil/uL   Hemoglobin 13.3  12.0 - 15.0 g/dL   HCT 40.4  36.0 - 46.0 %   MCV 93.1  78.0 - 100.0 fl   MCHC 33.0  30.0 - 36.0 g/dL   RDW 14.3  11.5 - 15.5 %   Platelets 255.0  150.0 - 400.0 K/uL   Neutrophils Relative % 59.5  43.0 - 77.0 %   Lymphocytes Relative 32.5  12.0 - 46.0 %   Monocytes Relative 4.1  3.0 - 12.0 %   Eosinophils Relative 3.5  0.0 - 5.0 %   Basophils Relative 0.4  0.0 - 3.0 %   Neutro Abs 3.4  1.4 - 7.7 K/uL   Lymphs Abs 1.9  0.7 - 4.0 K/uL   Monocytes Absolute 0.2  0.1 - 1.0 K/uL   Eosinophils Absolute 0.2  0.0 - 0.7 K/uL   Basophils Absolute 0.0  0.0 - 0.1 K/uL  COMPREHENSIVE METABOLIC PANEL     Status: Abnormal   Collection Time    12/26/13  1:39 PM      Result Value Ref Range   Sodium 137  135 - 145 mEq/L   Potassium 3.8   3.5 - 5.1 mEq/L   Chloride 104  96 - 112 mEq/L   CO2 26  19 - 32 mEq/L   Glucose, Bld 118 (*) 70 - 99 mg/dL   BUN 16  6 - 23 mg/dL   Creatinine, Ser 0.7  0.4 - 1.2 mg/dL   Total Bilirubin 0.5  0.2 - 1.2 mg/dL   Alkaline Phosphatase 108  39 - 117 U/L   AST 34  0 - 37 U/L   ALT 27  0 - 35 U/L   Total Protein 7.3  6.0 - 8.3 g/dL   Albumin 4.1  3.5 - 5.2 g/dL   Calcium 8.2 (*) 8.4 - 10.5 mg/dL   GFR 93.37  >60.00 mL/min     RADIOLOGY RESULTS: See E-Chart or I-Site for most recent results.  Images and reports are reviewed.  Mm Digital Diagnostic Unilat R  12/03/2013   CLINICAL DATA:  Spontaneous bloody right nipple discharge.  EXAM: DIGITAL DIAGNOSTIC  RIGHT MAMMOGRAM WITH CAD  COMPARISON:  With priors.  ACR Breast Density Category c: The breast tissue is heterogeneously dense, which may obscure small masses.  FINDINGS: No suspicious mass, malignant type microcalcifications or distortion detected.  On physical exam, I do not palpate a mass in the right breast. Bloody nipple discharge could be expressed from a single duct.  Mammographic images were processed with CAD.  IMPRESSION: Concerning right bloody nipple discharge.  RECOMMENDATION: A ductogram will be performed and dictated separately.  I have discussed the findings and recommendations with the patient. Results were also provided in writing at the conclusion of the visit. If applicable, a reminder letter will be sent to the patient regarding the next appointment.  BI-RADS CATEGORY  0: Incomplete. Need additional imaging evaluation and/or prior mammograms for comparison.   Electronically Signed   By: Lillia Mountain M.D.   On: 12/03/2013 14:01   Mm Ductogram Unilateral Right  12/03/2013   CLINICAL DATA:  Spontaneous right bloody nipple discharge.  EXAM: DUCTOGRAM UNILATERAL*R*  FINDINGS: On physical exam, I do not palpate a mass in the right breast. Bloody nipple  discharge could be expressed from a single duct. The nipple was cleansed with alcohol  and a sialogram catheter was placed into the orifice of the duct where nipple discharge could be expressed. Contrast material was injected. Mammographic images were obtained. There is an intraductal mass located in the 12 o'clock subareolar region of the breast located approximately 2 cm from the nipple.  IMPRESSION: Intraductal mass concerning for a papilloma, but malignancy cannot be excluded. Excision is recommended.  RECOMMENDATION: Surgical excision of the right breast intraductal mass is recommended. Surgical consultation has been scheduled with Dr. Barry Dienes on 12/29/2013. Results were discussed with the patient.  4: Suspicious.   Electronically Signed   By: Lillia Mountain M.D.   On: 12/03/2013 13:59      ASSESSMENT AND PLAN: Bloody discharge from nipple Patient has bloody nipple discharge. 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 However these can be associated with intraductal carcinomas or invasive carcinomas. We will excise this for final pathology. I discussed with the patient that there is a risk of cancer in the breast.   I reviewed the procedure of major duct excision. I discussed that we would take her to the operating room and cannulate the duct with the discharge with a small probe. I would then make a superior circumareolar incision and excise a cylinder of tissue. I discussed it would take several days for the pathology to come back.  Reviewed risks bleeding infection. She is not on any blood thinners.    Discussed that this is an outpatient procedure.    30 min spent with patient.       Milus Height MD Surgical Oncology, [facility]      Visit Diagnoses: 1. Bloody discharge from nipple     Primary Care Physician: Kathlene November, MD  Other care team [place]

## 2013-12-29 NOTE — Telephone Encounter (Signed)
Labs were faxed to [place].

## 2013-12-29 NOTE — Telephone Encounter (Signed)
please do if not done already

## 2013-12-29 NOTE — Assessment & Plan Note (Signed)
Patient has bloody nipple discharge. 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 However these can be associated with intraductal carcinomas or invasive carcinomas. We will excise this for final pathology. I discussed with the patient that there is a risk of cancer in the breast.   I reviewed the procedure of major duct excision. I discussed that we would take her to the operating room and cannulate the duct with the discharge with a small probe. I would then make a superior circumareolar incision and excise a cylinder of tissue. I discussed it would take several days for the pathology to come back.  Reviewed risks bleeding infection. She is not on any blood thinners.    Discussed that this is an outpatient procedure.    30 min spent with patient.

## 2013-12-29 NOTE — Patient Instructions (Signed)
IF YOU ARE TAKING ASPIRIN, COUMADIN/WARFARIN, PLAVIX, OR OTHER BLOOD THINNER, PLEASE LET us KNOW IMMEDIATELY.  WE WILL NEED TO DISCUSS WITH THE PRESCRIBING PROVIDER IF THESE ARE SAFE TO STOP. IF THESE ARE NOT STOPPED AT THE APPROPRIATE TIME, THIS WILL RESULT IN A DELAY FOR YOUR SURGERY.  DO NOT TAKE THESE MEDICATIONS OR IBUPROFEN/NAPROXEN WITHIN A WEEK BEFORE SURGERY.   The main risks of surgery are bleeding, infection, damage to other structures, and seroma (accumulation of fluid) under the incision site(s).    These complications may lead to additional procedures such as drainage of seroma/infection.  Depending on final pathology findings, you may need other surgeries to obtain negative margins or to take more lymph nodes.   Most women do accumulate fluid in the breast cavity where the specimen was removed. We do not always have to drain this fluid.  If your breast is very tense, painful, or red, then we may need to numb the skin and use a needle to aspirate the fluid.  We do provide patients with a Breast Binder.  The purpose of this is to avoid the use of tape on the sensitive tissue of the breast and to provide some compression to minimize the risk of seroma.  If the binder is uncomfortable, you may find that a tank top with a built-in shelf bra or a loose sports bra works better for you.  I recommend wearing this around the clock for the first 1-2 weeks except in the shower.    You may remove your dressings and may shower 48 hours after surgery.  Many patients have some constipation in the week after surgery due to the narcotics and anesthesia.  You may need over the counter stool softeners or laxatives if you experience difficulty having bowel movements.    If the following occur, call our office at [phone]: If you have a fever over 101 or pain that is severe despite narcotics. If you have redness or drainage at the wound. If you develop persistent nausea or vomiting.  I will follow  you back up in 1-4 weeks.    Please submit any paperwork about time off work/insurance forms to the front desk.

## 2014-01-02 ENCOUNTER — Encounter: Payer: Self-pay | Admitting: Internal Medicine

## 2014-01-05 ENCOUNTER — Other Ambulatory Visit (INDEPENDENT_AMBULATORY_CARE_PROVIDER_SITE_OTHER): Payer: Medicare Other

## 2014-01-05 ENCOUNTER — Other Ambulatory Visit: Payer: Self-pay | Admitting: Internal Medicine

## 2014-01-05 DIAGNOSIS — G6181 Chronic inflammatory demyelinating polyneuritis: Secondary | ICD-10-CM

## 2014-01-05 DIAGNOSIS — Z79899 Other long term (current) drug therapy: Secondary | ICD-10-CM

## 2014-01-05 MED ORDER — AMPHETAMINE-DEXTROAMPHETAMINE 10 MG PO TABS: 10.0000 mg | ORAL_TABLET | Freq: Four times a day (QID) | ORAL | Status: DC

## 2014-01-06 DIAGNOSIS — Z1231 Encounter for screening mammogram for malignant neoplasm of breast: Secondary | ICD-10-CM | POA: Diagnosis not present

## 2014-01-06 DIAGNOSIS — N819 Female genital prolapse, unspecified: Secondary | ICD-10-CM | POA: Diagnosis not present

## 2014-01-06 DIAGNOSIS — Z01419 Encounter for gynecological examination (general) (routine) without abnormal findings: Secondary | ICD-10-CM | POA: Diagnosis not present

## 2014-01-06 DIAGNOSIS — N281 Cyst of kidney, acquired: Secondary | ICD-10-CM | POA: Diagnosis not present

## 2014-01-06 DIAGNOSIS — N319 Neuromuscular dysfunction of bladder, unspecified: Secondary | ICD-10-CM | POA: Diagnosis not present

## 2014-01-06 DIAGNOSIS — Z0000 Encounter for general adult medical examination without abnormal findings: Secondary | ICD-10-CM | POA: Diagnosis not present

## 2014-01-06 DIAGNOSIS — R82998 Other abnormal findings in urine: Secondary | ICD-10-CM | POA: Diagnosis not present

## 2014-01-06 LAB — COMPREHENSIVE METABOLIC PANEL
ALT: 32 U/L (ref 0–35)
AST: 33 U/L (ref 0–37)
Albumin: 4 g/dL (ref 3.5–5.2)
Alkaline Phosphatase: 91 U/L (ref 39–117)
BUN: 17 mg/dL (ref 6–23)
CO2: 22 mEq/L (ref 19–32)
Calcium: 8.3 mg/dL — ABNORMAL LOW (ref 8.4–10.5)
Chloride: 108 mEq/L (ref 96–112)
Creatinine, Ser: 0.7 mg/dL (ref 0.4–1.2)
GFR: 84.32 mL/min (ref >60.00)
Glucose, Bld: 86 mg/dL (ref 70–99)
Potassium: 3.9 mEq/L (ref 3.5–5.1)
Sodium: 140 mEq/L (ref 135–145)
Total Bilirubin: 0.3 mg/dL (ref 0.2–1.2)
Total Protein: 6.9 g/dL (ref 6.0–8.3)

## 2014-01-06 LAB — CBC WITH DIFFERENTIAL/PLATELET
Basophils Absolute: 0 10*3/uL (ref 0.0–0.1)
Basophils Relative: 0.6 % (ref 0.0–3.0)
Eosinophils Absolute: 0.2 10*3/uL (ref 0.0–0.7)
Eosinophils Relative: 3 % (ref 0.0–5.0)
HCT: 38.9 % (ref 36.0–46.0)
Hemoglobin: 13 g/dL (ref 12.0–15.0)
Lymphocytes Relative: 26.7 % (ref 12.0–46.0)
Lymphs Abs: 1.5 10*3/uL (ref 0.7–4.0)
MCHC: 33.4 g/dL (ref 30.0–36.0)
MCV: 92 fl (ref 78.0–100.0)
Monocytes Absolute: 0.3 10*3/uL (ref 0.1–1.0)
Monocytes Relative: 4.5 % (ref 3.0–12.0)
Neutro Abs: 3.8 10*3/uL (ref 1.4–7.7)
Neutrophils Relative %: 65.2 % (ref 43.0–77.0)
Platelets: 257 10*3/uL (ref 150.0–400.0)
RBC: 4.23 Mil/uL (ref 3.87–5.11)
RDW: 15.1 % (ref 11.5–15.5)
WBC: 5.8 10*3/uL (ref 4.0–10.5)

## 2014-01-07 ENCOUNTER — Encounter: Payer: Self-pay | Admitting: *Deleted

## 2014-01-07 ENCOUNTER — Encounter (HOSPITAL_BASED_OUTPATIENT_CLINIC_OR_DEPARTMENT_OTHER): Payer: Self-pay | Admitting: *Deleted

## 2014-01-07 NOTE — Progress Notes (Signed)
Pt has been her a few times-has many health problems but is doing great now-chronic pulm-better-to bring meds-do all neb,inhalers-to come in for new ekg-labs done 01/06/14

## 2014-01-08 ENCOUNTER — Other Ambulatory Visit: Payer: Self-pay | Admitting: Obstetrics and Gynecology

## 2014-01-08 ENCOUNTER — Encounter (HOSPITAL_BASED_OUTPATIENT_CLINIC_OR_DEPARTMENT_OTHER)
Admission: RE | Admit: 2014-01-08 | Discharge: 2014-01-08 | Disposition: A | Discharge status: Home / Self Care | Payer: Medicare Other | Source: Ambulatory Visit | Attending: General Surgery | Admitting: General Surgery

## 2014-01-08 ENCOUNTER — Encounter: Payer: Self-pay | Admitting: Internal Medicine

## 2014-01-08 DIAGNOSIS — R9431 Abnormal electrocardiogram [ECG] [EKG]: Secondary | ICD-10-CM | POA: Diagnosis not present

## 2014-01-08 DIAGNOSIS — Z01812 Encounter for preprocedural laboratory examination: Secondary | ICD-10-CM | POA: Insufficient documentation

## 2014-01-08 DIAGNOSIS — R922 Inconclusive mammogram: Secondary | ICD-10-CM

## 2014-01-08 DIAGNOSIS — Z01810 Encounter for preprocedural cardiovascular examination: Secondary | ICD-10-CM | POA: Diagnosis present

## 2014-01-12 ENCOUNTER — Ambulatory Visit
Admission: RE | Admit: 2014-01-12 | Discharge: 2014-01-12 | Disposition: A | Discharge status: Home / Self Care | Payer: Medicare Other | Source: Ambulatory Visit | Attending: Obstetrics and Gynecology | Admitting: Obstetrics and Gynecology

## 2014-01-12 DIAGNOSIS — R922 Inconclusive mammogram: Secondary | ICD-10-CM | POA: Diagnosis not present

## 2014-01-13 ENCOUNTER — Ambulatory Visit (AMBULATORY_SURGERY_CENTER): Payer: Self-pay

## 2014-01-13 VITALS — Ht 62.0 in | Wt 129.2 lb

## 2014-01-13 DIAGNOSIS — Z86010 Personal history of colonic polyps: Secondary | ICD-10-CM

## 2014-01-13 DIAGNOSIS — Z800 Family history of malignant neoplasm of digestive organs: Secondary | ICD-10-CM

## 2014-01-13 MED ORDER — MOVIPREP 100 G PO SOLR: ORAL | Status: DC

## 2014-01-13 NOTE — Progress Notes (Signed)
Per pt, no allergies to soy or egg products.Pt not taking any weight loss meds or using  O2 at home. 

## 2014-01-14 ENCOUNTER — Encounter: Payer: Medicare Other | Admitting: Internal Medicine

## 2014-01-15 ENCOUNTER — Encounter (HOSPITAL_BASED_OUTPATIENT_CLINIC_OR_DEPARTMENT_OTHER)
Admission: RE | Disposition: A | Discharge status: Home / Self Care | Payer: Self-pay | Source: Ambulatory Visit | Attending: General Surgery

## 2014-01-15 ENCOUNTER — Ambulatory Visit (HOSPITAL_BASED_OUTPATIENT_CLINIC_OR_DEPARTMENT_OTHER): Payer: Medicare Other | Admitting: Anesthesiology

## 2014-01-15 ENCOUNTER — Encounter (HOSPITAL_BASED_OUTPATIENT_CLINIC_OR_DEPARTMENT_OTHER): Payer: Self-pay | Admitting: *Deleted

## 2014-01-15 ENCOUNTER — Encounter (HOSPITAL_BASED_OUTPATIENT_CLINIC_OR_DEPARTMENT_OTHER): Payer: Medicare Other | Admitting: Anesthesiology

## 2014-01-15 ENCOUNTER — Ambulatory Visit (HOSPITAL_BASED_OUTPATIENT_CLINIC_OR_DEPARTMENT_OTHER)
Admission: RE | Admit: 2014-01-15 | Discharge: 2014-01-15 | Disposition: A | Discharge status: Home / Self Care | Payer: Medicare Other | Source: Ambulatory Visit | Attending: General Surgery | Admitting: General Surgery

## 2014-01-15 DIAGNOSIS — Z85828 Personal history of other malignant neoplasm of skin: Secondary | ICD-10-CM | POA: Diagnosis not present

## 2014-01-15 DIAGNOSIS — F3289 Other specified depressive episodes: Secondary | ICD-10-CM | POA: Diagnosis not present

## 2014-01-15 DIAGNOSIS — J449 Chronic obstructive pulmonary disease, unspecified: Secondary | ICD-10-CM | POA: Insufficient documentation

## 2014-01-15 DIAGNOSIS — E559 Vitamin D deficiency, unspecified: Secondary | ICD-10-CM | POA: Diagnosis not present

## 2014-01-15 DIAGNOSIS — D249 Benign neoplasm of unspecified breast: Secondary | ICD-10-CM | POA: Diagnosis not present

## 2014-01-15 DIAGNOSIS — I10 Essential (primary) hypertension: Secondary | ICD-10-CM | POA: Insufficient documentation

## 2014-01-15 DIAGNOSIS — J4489 Other specified chronic obstructive pulmonary disease: Secondary | ICD-10-CM | POA: Diagnosis not present

## 2014-01-15 DIAGNOSIS — E785 Hyperlipidemia, unspecified: Secondary | ICD-10-CM | POA: Diagnosis not present

## 2014-01-15 DIAGNOSIS — E039 Hypothyroidism, unspecified: Secondary | ICD-10-CM | POA: Diagnosis not present

## 2014-01-15 DIAGNOSIS — K5900 Constipation, unspecified: Secondary | ICD-10-CM | POA: Insufficient documentation

## 2014-01-15 DIAGNOSIS — F411 Generalized anxiety disorder: Secondary | ICD-10-CM | POA: Insufficient documentation

## 2014-01-15 DIAGNOSIS — K219 Gastro-esophageal reflux disease without esophagitis: Secondary | ICD-10-CM | POA: Insufficient documentation

## 2014-01-15 DIAGNOSIS — N6459 Other signs and symptoms in breast: Secondary | ICD-10-CM | POA: Diagnosis not present

## 2014-01-15 DIAGNOSIS — F329 Major depressive disorder, single episode, unspecified: Secondary | ICD-10-CM | POA: Diagnosis not present

## 2014-01-15 DIAGNOSIS — Z96698 Presence of other orthopedic joint implants: Secondary | ICD-10-CM | POA: Insufficient documentation

## 2014-01-15 DIAGNOSIS — Z79899 Other long term (current) drug therapy: Secondary | ICD-10-CM | POA: Insufficient documentation

## 2014-01-15 DIAGNOSIS — D649 Anemia, unspecified: Secondary | ICD-10-CM | POA: Insufficient documentation

## 2014-01-15 DIAGNOSIS — Z90710 Acquired absence of both cervix and uterus: Secondary | ICD-10-CM | POA: Diagnosis not present

## 2014-01-15 DIAGNOSIS — F17200 Nicotine dependence, unspecified, uncomplicated: Secondary | ICD-10-CM | POA: Diagnosis not present

## 2014-01-15 DIAGNOSIS — Z86711 Personal history of pulmonary embolism: Secondary | ICD-10-CM | POA: Insufficient documentation

## 2014-01-15 HISTORY — PX: BREAST DUCTAL SYSTEM EXCISION: SHX5242

## 2014-01-15 LAB — POCT HEMOGLOBIN-HEMACUE: Hemoglobin: 14 g/dL (ref 12.0–15.0)

## 2014-01-15 SURGERY — EXCISION DUCTAL SYSTEM BREAST
Anesthesia: General | Laterality: Right

## 2014-01-15 MED ORDER — 0.9 % SODIUM CHLORIDE (POUR BTL) OPTIME
TOPICAL | Status: DC | PRN
  Administered 2014-01-15: 200 mL

## 2014-01-15 MED ORDER — LIDOCAINE-EPINEPHRINE (PF) 1 %-1:200000 IJ SOLN
INTRAMUSCULAR | Status: DC | PRN
  Administered 2014-01-15: 13:00:00

## 2014-01-15 MED ORDER — LACTATED RINGERS IV SOLN
INTRAVENOUS | Status: DC
  Administered 2014-01-15: 11:00:00 via INTRAVENOUS

## 2014-01-15 MED ORDER — FENTANYL CITRATE 0.05 MG/ML IJ SOLN: 50.0000 ug | INTRAMUSCULAR | Status: DC | PRN

## 2014-01-15 MED ORDER — SODIUM CHLORIDE 0.9 % IJ SOLN: 3.0000 mL | Freq: Two times a day (BID) | INTRAMUSCULAR | Status: DC

## 2014-01-15 MED ORDER — HYDROMORPHONE HCL PF 1 MG/ML IJ SOLN: 0.2500 mg | INTRAMUSCULAR | Status: DC | PRN

## 2014-01-15 MED ORDER — PROPOFOL 10 MG/ML IV BOLUS
INTRAVENOUS | Status: DC | PRN
  Administered 2014-01-15: 120 mg via INTRAVENOUS

## 2014-01-15 MED ORDER — DEXAMETHASONE SODIUM PHOSPHATE 10 MG/ML IJ SOLN
INTRAMUSCULAR | Status: DC | PRN
Start: 2014-01-15 — End: 2014-01-15
  Administered 2014-01-15: 10 mg via INTRAVENOUS

## 2014-01-15 MED ORDER — CEFAZOLIN SODIUM-DEXTROSE 2-3 GM-% IV SOLR
INTRAVENOUS | Status: AC
  Filled 2014-01-15: qty 50

## 2014-01-15 MED ORDER — SODIUM CHLORIDE 0.9 % IJ SOLN
3.0000 mL | INTRAMUSCULAR | Status: DC | PRN
Start: 2014-01-15 — End: 2014-01-15

## 2014-01-15 MED ORDER — ONDANSETRON HCL 4 MG/2ML IJ SOLN
INTRAMUSCULAR | Status: DC | PRN
  Administered 2014-01-15: 4 mg via INTRAVENOUS

## 2014-01-15 MED ORDER — SODIUM CHLORIDE 0.9 % IV SOLN: 250.0000 mL | INTRAVENOUS | Status: DC | PRN

## 2014-01-15 MED ORDER — FENTANYL CITRATE 0.05 MG/ML IJ SOLN
INTRAMUSCULAR | Status: AC
  Filled 2014-01-15: qty 6

## 2014-01-15 MED ORDER — CEFAZOLIN SODIUM-DEXTROSE 2-3 GM-% IV SOLR
2.0000 g | INTRAVENOUS | Status: AC
  Administered 2014-01-15: 2 g via INTRAVENOUS

## 2014-01-15 MED ORDER — MIDAZOLAM HCL 2 MG/2ML IJ SOLN
INTRAMUSCULAR | Status: AC
  Filled 2014-01-15: qty 2

## 2014-01-15 MED ORDER — FENTANYL CITRATE 0.05 MG/ML IJ SOLN
INTRAMUSCULAR | Status: DC | PRN
  Administered 2014-01-15: 25 ug via INTRAVENOUS
  Administered 2014-01-15 (×2): 50 ug via INTRAVENOUS

## 2014-01-15 MED ORDER — OXYCODONE-ACETAMINOPHEN 5-325 MG PO TABS: 1.0000 | ORAL_TABLET | ORAL | Status: DC | PRN

## 2014-01-15 MED ORDER — ACETAMINOPHEN 650 MG RE SUPP: 650.0000 mg | RECTAL | Status: DC | PRN

## 2014-01-15 MED ORDER — OXYCODONE HCL 5 MG PO TABS: 5.0000 mg | ORAL_TABLET | ORAL | Status: DC | PRN

## 2014-01-15 MED ORDER — ACETAMINOPHEN 325 MG PO TABS: 650.0000 mg | ORAL_TABLET | ORAL | Status: DC | PRN

## 2014-01-15 MED ORDER — MIDAZOLAM HCL 2 MG/2ML IJ SOLN: 1.0000 mg | INTRAMUSCULAR | Status: DC | PRN

## 2014-01-15 SURGICAL SUPPLY — 52 items
BINDER BREAST MEDIUM (GAUZE/BANDAGES/DRESSINGS) ×1 IMPLANT
BLADE HEX COATED 2.75 (ELECTRODE) ×2 IMPLANT
BLADE SURG 15 STRL LF DISP TIS (BLADE) ×1 IMPLANT
BLADE SURG 15 STRL SS (BLADE) ×2
CANISTER SUCT 1200ML W/VALVE (MISCELLANEOUS) ×2 IMPLANT
CHLORAPREP W/TINT 26ML (MISCELLANEOUS) ×2 IMPLANT
CLIP TI LARGE 6 (CLIP) IMPLANT
COVER MAYO STAND STRL (DRAPES) ×2 IMPLANT
COVER TABLE BACK 60X90 (DRAPES) ×2 IMPLANT
DECANTER SPIKE VIAL GLASS SM (MISCELLANEOUS) IMPLANT
DEVICE DUBIN W/COMP PLATE 8390 (MISCELLANEOUS) IMPLANT
DRAPE PED LAPAROTOMY (DRAPES) ×2 IMPLANT
DRAPE UTILITY XL STRL (DRAPES) ×4 IMPLANT
DRSG PAD ABDOMINAL 8X10 ST (GAUZE/BANDAGES/DRESSINGS) ×1 IMPLANT
ELECT REM PT RETURN 9FT ADLT (ELECTROSURGICAL) ×2
ELECTRODE REM PT RTRN 9FT ADLT (ELECTROSURGICAL) ×1 IMPLANT
GLOVE BIO SURGEON STRL SZ 6 (GLOVE) ×2 IMPLANT
GLOVE BIO SURGEON STRL SZ7.5 (GLOVE) ×2 IMPLANT
GLOVE BIOGEL PI IND STRL 6.5 (GLOVE) ×1 IMPLANT
GLOVE BIOGEL PI IND STRL 7.0 (GLOVE) IMPLANT
GLOVE BIOGEL PI IND STRL 8 (GLOVE) ×1 IMPLANT
GLOVE BIOGEL PI INDICATOR 6.5 (GLOVE) ×1
GLOVE BIOGEL PI INDICATOR 7.0 (GLOVE) ×1
GLOVE BIOGEL PI INDICATOR 8 (GLOVE) ×1
GLOVE ECLIPSE 7.0 STRL STRAW (GLOVE) ×1 IMPLANT
GLOVE EXAM NITRILE MD LF STRL (GLOVE) ×2 IMPLANT
GOWN STRL REUS W/ TWL LRG LVL3 (GOWN DISPOSABLE) ×1 IMPLANT
GOWN STRL REUS W/ TWL XL LVL3 (GOWN DISPOSABLE) IMPLANT
GOWN STRL REUS W/TWL 2XL LVL3 (GOWN DISPOSABLE) ×2 IMPLANT
GOWN STRL REUS W/TWL LRG LVL3 (GOWN DISPOSABLE) ×2
GOWN STRL REUS W/TWL XL LVL3 (GOWN DISPOSABLE) ×2
NDL HYPO 25X1 1.5 SAFETY (NEEDLE) ×1 IMPLANT
NEEDLE HYPO 25X1 1.5 SAFETY (NEEDLE) ×2 IMPLANT
NS IRRIG 1000ML POUR BTL (IV SOLUTION) ×2 IMPLANT
PACK BASIN DAY SURGERY FS (CUSTOM PROCEDURE TRAY) ×2 IMPLANT
PENCIL BUTTON HOLSTER BLD 10FT (ELECTRODE) ×2 IMPLANT
SLEEVE SCD COMPRESS KNEE MED (MISCELLANEOUS) ×2 IMPLANT
SPONGE GAUZE 4X4 12PLY STER LF (GAUZE/BANDAGES/DRESSINGS) ×2 IMPLANT
SPONGE LAP 18X18 X RAY DECT (DISPOSABLE) ×2 IMPLANT
STAPLER VISISTAT 35W (STAPLE) IMPLANT
STRIP CLOSURE SKIN 1/2X4 (GAUZE/BANDAGES/DRESSINGS) ×2 IMPLANT
SUT MON AB 4-0 PC3 18 (SUTURE) ×2 IMPLANT
SUT SILK 2 0 SH (SUTURE) ×2 IMPLANT
SUT VIC AB 3-0 54X BRD REEL (SUTURE) IMPLANT
SUT VIC AB 3-0 BRD 54 (SUTURE)
SUT VIC AB 3-0 SH 27 (SUTURE) ×4
SUT VIC AB 3-0 SH 27X BRD (SUTURE) ×1 IMPLANT
SYR CONTROL 10ML LL (SYRINGE) ×2 IMPLANT
TOWEL OR 17X24 6PK STRL BLUE (TOWEL DISPOSABLE) ×2 IMPLANT
TOWEL OR NON WOVEN STRL DISP B (DISPOSABLE) ×2 IMPLANT
TUBE CONNECTING 20X1/4 (TUBING) ×2 IMPLANT
YANKAUER SUCT BULB TIP NO VENT (SUCTIONS) ×2 IMPLANT

## 2014-01-15 NOTE — Anesthesia Procedure Notes (Signed)
Procedure Name: LMA Insertion Date/Time: 01/15/2014 12:37 PM Performed by: Tressie Stalker E Pre-anesthesia Checklist: Patient identified, Patient being monitored, Emergency Drugs available, Timeout performed and Suction available Patient Re-evaluated:Patient Re-evaluated prior to inductionOxygen Delivery Method: Circle System Utilized Preoxygenation: Pre-oxygenation with 100% oxygen Intubation Type: IV induction Ventilation: Mask ventilation without difficulty LMA: LMA inserted LMA Size: 4.0 Number of attempts: 1 Placement Confirmation: positive ETCO2 and breath sounds checked- equal and bilateral

## 2014-01-15 NOTE — Anesthesia Preprocedure Evaluation (Addendum)
Anesthesia Evaluation  Patient identified by MRN, date of birth, ID band Patient awake    Reviewed: Allergy & Precautions, H&P , NPO status , Patient's Chart, lab work & pertinent test results  Airway Mallampati: II TM Distance: >3 FB Neck ROM: Full    Dental no notable dental hx. (+) Teeth Intact, Dental Advisory Given   Pulmonary asthma , COPD COPD inhaler, former smoker,  breath sounds clear to auscultation  Pulmonary exam normal       Cardiovascular hypertension, Pt. on medications Rhythm:Regular Rate:Normal     Neuro/Psych Seizures -, Well Controlled,  Depression    GI/Hepatic Neg liver ROS, GERD-  Medicated and Controlled,  Endo/Other  Hypothyroidism   Renal/GU negative Renal ROS  negative genitourinary   Musculoskeletal   Abdominal   Peds  Hematology negative hematology ROS (+) anemia ,   Anesthesia Other Findings   Reproductive/Obstetrics negative OB ROS                          Anesthesia Physical Anesthesia Plan  ASA: III  Anesthesia Plan: General   Post-op Pain Management:    Induction: Intravenous  Airway Management Planned: LMA  Additional Equipment:   Intra-op Plan:   Post-operative Plan: Extubation in OR  Informed Consent: I have reviewed the patients History and Physical, chart, labs and discussed the procedure including the risks, benefits and alternatives for the proposed anesthesia with the patient or authorized representative who has indicated his/her understanding and acceptance.   Dental advisory given  Plan Discussed with: CRNA  Anesthesia Plan Comments:         Anesthesia Quick Evaluation

## 2014-01-15 NOTE — Discharge Instructions (Addendum)
[facility] °Office Phone Number [phone] ° ° POST OP INSTRUCTIONS ° °Always review your discharge instruction sheet given to you by the facility where your surgery was performed. ° °IF YOU HAVE DISABILITY OR FAMILY LEAVE FORMS, YOU MUST BRING THEM TO THE OFFICE FOR PROCESSING.  DO NOT GIVE THEM TO YOUR DOCTOR. ° °1. A prescription for pain medication may be given to you upon discharge.  Take your pain medication as prescribed, if needed.  If narcotic pain medicine is not needed, then you may take acetaminophen (Tylenol) or ibuprofen (Advil) as needed. °2. Take your usually prescribed medications unless otherwise directed °3. If you need a refill on your pain medication, please contact your pharmacy.  They will contact our office to request authorization.  Prescriptions will not be filled after 5pm or on week-ends. °4. You should eat very light the first 24 hours after surgery, such as soup, crackers, pudding, etc.  Resume your normal diet the day after surgery °5. It is common to experience some constipation if taking pain medication after surgery.  Increasing fluid intake and taking a stool softener will usually help or prevent this problem from occurring.  A mild laxative (Milk of Magnesia or Miralax) should be taken according to package directions if there are no bowel movements after 48 hours. °6. You may shower in 48 hours.  The surgical glue will flake off in 2-3 weeks.   °7. ACTIVITIES:  No strenuous activity or heavy lifting for 1 week.   °a. You may drive when you no longer are taking prescription pain medication, you can comfortably wear a seatbelt, and you can safely maneuver your car and apply brakes. °b. RETURN TO WORK:  __________n/a_______________ °You should see your doctor in the office for a follow-up appointment approximately three-four weeks after your surgery.   ° °WHEN TO CALL YOUR DOCTOR: °1. Fever over 101.0 °2. Nausea and/or vomiting. °3. Extreme swelling or  bruising. °4. Continued bleeding from incision. °5. Increased pain, redness, or drainage from the incision. ° °The clinic staff is available to answer your questions during regular business hours.  Please don’t hesitate to call and ask to speak to one of the nurses for clinical concerns.  If you have a medical emergency, go to the nearest emergency room or call 911.  A surgeon from [facility] is always on call at the hospital. ° °For further questions, please visit [email or web address]  ° ° °Post Anesthesia Home Care Instructions ° °Activity: °Get plenty of rest for the remainder of the day. A responsible adult should stay with you for 24 hours following the procedure.  °For the next 24 hours, DO NOT: °-Drive a car °-Operate machinery °-Drink alcoholic beverages °-Take any medication unless instructed by your physician °-Make any legal decisions or sign important papers. ° °Meals: °Start with liquid foods such as gelatin or soup. Progress to regular foods as tolerated. Avoid greasy, spicy, heavy foods. If nausea and/or vomiting occur, drink only clear liquids until the nausea and/or vomiting subsides. Call your physician if vomiting continues. ° °Special Instructions/Symptoms: °Your throat may feel dry or sore from the anesthesia or the breathing tube placed in your throat during surgery. If this causes discomfort, gargle with warm salt water. The discomfort should disappear within 24 hours. ° °

## 2014-01-15 NOTE — Interval H&P Note (Signed)
History and Physical Interval Note:  01/15/2014 12:12 PM  [name]  has presented today for surgery, with the diagnosis of RIGHT BLOODY NIPPLE DISCHARGE  The various methods of treatment have been discussed with the patient and family. After consideration of risks, benefits and other options for treatment, the patient has consented to  Procedure(s): [place] (Right) as a surgical intervention .  The patient's history has been reviewed, patient examined, no change in status, stable for surgery.  I have reviewed the patient's chart and labs.  Questions were answered to the patient's satisfaction.     Jenney Brester

## 2014-01-15 NOTE — H&P (View-Only) (Signed)
Chief Complaint  Patient presents with  . Breast Mass    new pt- eval breast papilloma    Referring MD: Miquel Dunn, MD  HISTORY:  Patient is a 80 year old female who I took care of previously for a cecal adenoma. She presents with a several month history of bloody nipple discharge. She has not been able to palpate a mass. She has been up-to-date with her mammograms. She underwent ductogram demonstrating a filling defect approximately 2 cm from the nipple at the 12:00 position. She has no history of cancer. She did recently have a positive ColoGuard test and is due for another colonoscopy.  She does not have any breast pain. She did have a sister with ovarian cancer.  Past Medical History  Diagnosis Date  . Anemia   . Anxiety -depression     patient denied  . Asthma -COPD   . Chronic fatigue syndrome   . GERD (gastroesophageal reflux disease)   . Hyperlipidemia   . Hypertension   . Colon polyp   . Allergic rhinitis   . Vitamin D deficiency   . Pulmonary embolism     1980s  . Neuropathy   . Arthritis   . 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 Blood transfusion   . Osteoporosis   . Hypothyroid   . Constipation   . Family history of anesthesia complication     Daughter with history of malignant hyperthermia  . Seizures     1990 last seizures on meds Phenobarb  . Skin cancer     squamous cell  . Neurogenic bladder 2013    husband caths pt  . chronic sinusitis 08/08/2012  . Shingles late 55's  . Cystocele 09/16/2012  . Tubular adenoma   . Breast mass     Past Surgical History  Procedure Laterality Date  . Tonsillectomy and adenoidectomy    . Nasal septum surgery    . Dilation and curettage of uterus    . Tubal ligation    . Cesarean section    . Sinus surgeries      x 4  . Left ovary and tube removed    . Vocal polyps removed    . Appendectomy    . Cystocele repair    . Carpal tunnel release      right  . Shoulder arthroscopy      x2 left, 1  right  . Left finger fusion    . Right median nerve decompression    . Duptyren's contracture right hand    . Finger ganglion cyst excision      right  . Abdominal hysterectomy    . Joint replacement      right and left basal joints of thumbs  . Bladder suspension    . Cataract extraction      bilateral  . Cervical neck ablation      x 7, C3-C4  . Squamous lesions removed      neck and face  . Basal cell carcinoma excision      face  . Panniculectomy    . Nasal polyp surgery      4  . Anterior fusion clivus-c2 extraoral w/ odontoid excision  8/14  . Proximal interphalangeal fusion (pip) Left 09/09/2013    Procedure: FUSION LEFT INDEX PROXIMAL INTERPHALANGEAL JOINT (PIP);  Surgeon: Cammie Sickle., MD;  Location: [facility];  Service: Orthopedics;  Laterality: Left;    Current Outpatient Prescriptions  Medication Sig Dispense Refill  . amLODipine (  NORVASC) 10 MG tablet Take 1 tablet (10 mg total) by mouth every morning.  90 tablet  2  . amphetamine-dextroamphetamine (ADDERALL) 10 MG tablet Take 1 tablet (10 mg total) by mouth 4 (four) times daily. May  120 tablet  0  . arformoterol (BROVANA) 15 MCG/2ML NEBU Take 2 mLs (15 mcg total) by nebulization 2 (two) times daily.  120 mL  6  . Ascorbic Acid (VITAMIN C PO) Take 2 capsules by mouth daily.      Marland Kitchen atorvastatin (LIPITOR) 40 MG tablet Take 1 tablet (40 mg total) by mouth daily.  90 tablet  2  . budesonide (PULMICORT) 0.25 MG/2ML nebulizer solution Take 2 mLs (0.25 mg total) by nebulization 2 (two) times daily.  60 mL  6  . CALCIUM-VITAMIN D PO Take 1 tablet by mouth daily.       . carbamazepine (TEGRETOL XR) 100 MG 12 hr tablet Take 100 mg by mouth 2 (two) times daily.       Marland Kitchen denosumab (PROLIA) 60 MG/ML SOLN injection Inject 60 mg into the skin every 6 (six) months. Administer in upper arm, thigh, or abdomen      . dexlansoprazole (DEXILANT) 60 MG capsule Take 60 mg by mouth every other day.      . diclofenac  sodium (VOLTAREN) 1 % GEL Apply 1 application topically 4 (four) times daily as needed. Applies to fingers and hands for gout.      . Esterified Estrogens (MENEST) 0.3 MG tablet ON HOLD      . levothyroxine (SYNTHROID, LEVOTHROID) 25 MCG tablet Take 1 1/2 tablet daily.  45 tablet  3  . lidocaine (LIDODERM) 5 % Place 2 patches onto the skin as needed. Marland Kitchen Applies to back and neck.      . mometasone (ELOCON) 0.1 % cream Apply 1 application topically daily.      . mometasone (NASONEX) 50 MCG/ACT nasal spray PLACE 2 SPRAYS INTO THE NOSE DAILY  17 g  6  . montelukast (SINGULAIR) 10 MG tablet Take 1 tablet (10 mg total) by mouth at bedtime.  90 tablet  4  . ondansetron (ZOFRAN-ODT) 4 MG disintegrating tablet Take 4 mg by mouth 2 (two) times daily as needed.      Marland Kitchen PHENobarbital (LUMINAL) 32.4 MG tablet Take 32.4-64.8 mg by mouth 4 (four) times daily -  with meals and at bedtime. one tablet at 6 am, one tablet at noon,  two tabletss at 8 pm.      . polyethylene glycol (MIRALAX / GLYCOLAX) packet Take 17 g by mouth at bedtime.  14 each    . predniSONE (DELTASONE) 20 MG tablet Take 20 mg by mouth daily.      Marland Kitchen PROAIR HFA 108 (90 BASE) MCG/ACT inhaler Inhale 1 puff into the lungs as needed.       . Vitamin D, Ergocalciferol, (DRISDOL) 50000 UNITS CAPS capsule TAKE 1 CAPSULE EVERY WEEK  12 capsule  3   No current facility-administered medications for this visit.     Allergies  Allergen Reactions  . Iodine Other (See Comments)    Cardiac arrest  . Morphine And Related Other (See Comments)    Cardiac arrest     Family History  Problem Relation Age of Onset  . Heart disease Mother   . Hyperlipidemia Mother     Jerilynn Mages, aunt  . Allergies Sister   . Ovarian cancer Sister   . Lung cancer Sister     lung - stage 1  .  Thyroid cancer Sister   . Colon cancer Father   . Stomach cancer Other     first cousin  . Skin cancer Daughter   . Malignant hyperthermia Daughter      History   Social History  .  Marital Status: Married    Spouse Name: N/A    Number of Children: 3  . Years of Education: N/A   Occupational History  . retired, Licensed conveyancer    Social History Main Topics  . Smoking status: Former Smoker -- 0.50 packs/day for 3 years    Types: Cigarettes    Start date: 03/30/1975    Quit date: 05/29/1978  . Smokeless tobacco: Never Used     Comment: quit smoking 35 years ago  . Alcohol Use: No  . Drug Use: No  . Sexual Activity: Yes   Other Topics Concern  . None   Social History Narrative   Pt lives at home with her spouse.   Moved to [place] ~ 2004     REVIEW OF SYSTEMS - PERTINENT POSITIVES ONLY: 12 point review of systems negative other than HPI and PMH   EXAM: Filed Vitals:   12/29/13 0901  BP: 112/68  Pulse: 84  Temp: 97.8 F (36.6 C)  Resp: 18    Wt Readings from Last 3 Encounters:  12/29/13 126 lb 12.8 oz (57.516 kg)  12/12/13 126 lb (57.153 kg)  12/11/13 126 lb (57.153 kg)     Gen:  No acute distress.  Well nourished and well groomed.   Neurological: Alert and oriented to person, place, and time. Coordination normal.  Head: Normocephalic and atraumatic.  Eyes: Conjunctivae are normal. Pupils are equal, round, and reactive to light. No scleral icterus.  Neck: Normal range of motion. Neck supple. No tracheal deviation or thyromegaly present.  Cardiovascular: Normal rate, regular rhythm, normal heart sounds and intact distal pulses.  Exam reveals no gallop and no friction rub.  No murmur heard. Breast: right bloody nipple discharge.  Spontaneous and reproducible.  No masses were palpable.  Dense breast tissue for age.   Respiratory: Effort normal.  No respiratory distress. No chest wall tenderness. Breath sounds normal.  No wheezes, rales or rhonchi.  GI: Soft. Bowel sounds are normal. The abdomen is soft and nontender.  There is no rebound and no guarding.  Musculoskeletal: Normal range of motion. Extremities are nontender.  Lymphadenopathy: No  cervical, preauricular, postauricular or axillary adenopathy is present Skin: Skin is warm and dry. No rash noted. No diaphoresis. No erythema. No pallor. No clubbing, cyanosis, or edema.   Psychiatric: Normal mood and affect. Behavior is normal. Judgment and thought content normal.    LABORATORY RESULTS: Available labs are reviewed   Recent Results (from the past 2160 hour(s))  CBC WITH DIFFERENTIAL ([facility])     Status: None   Collection Time    10/27/13  3:32 PM      Result Value Ref Range   WBC 5.9  3.9 - 10.0 10e3/uL   RBC 4.48  3.70 - 5.32 10e6/uL   HGB 13.5  11.6 - 15.9 g/dL   HCT 40.2  34.8 - 46.6 %   MCV 90  81 - 101 fL   MCH 30.1  26.0 - 34.0 pg   MCHC 33.6  32.0 - 36.0 g/dL   RDW 13.5  11.1 - 15.7 %   Platelets 267  145 - 400 10e3/uL   NEUT# 3.1  1.5 - 6.5 10e3/uL   LYMPH# 2.0  0.9 - 3.3 10e3/uL   MONO# 0.5  0.1 - 0.9 10e3/uL   Eosinophils Absolute 0.2  0.0 - 0.5 10e3/uL   BASO# 0.0  0.0 - 0.2 10e3/uL   NEUT% 53.3  39.6 - 80.0 %   LYMPH% 33.7  14.0 - 48.0 %   MONO% 8.8  0.0 - 13.0 %   EOS% 3.7  0.0 - 7.0 %   BASO% 0.5  0.0 - 2.0 %  IRON AND TIBC [facility]     Status: None   Collection Time    10/27/13  3:32 PM      Result Value Ref Range   Iron 69  41 - 142 ug/dL   TIBC 268  236 - 444 ug/dL   UIBC 198  120 - 384 ug/dL   %SAT 26  21 - 57 %  FERRITIN [facility]     Status: None   Collection Time    10/27/13  3:32 PM      Result Value Ref Range   Ferritin 68  9 - 269 ng/ml  [facility] - SMEAR     Status: None   Collection Time    10/27/13  3:32 PM      Result Value Ref Range   Smear Result Smear Available    CBC WITH DIFFERENTIAL     Status: None   Collection Time    11/19/13  1:27 PM      Result Value Ref Range   WBC 5.4  4.0 - 10.5 K/uL   RBC 4.08  3.87 - 5.11 Mil/uL   Hemoglobin 12.5  12.0 - 15.0 g/dL   HCT 37.3  36.0 - 46.0 %   MCV 91.5  78.0 - 100.0 fl   MCHC 33.5  30.0 - 36.0 g/dL   RDW 13.9  11.5 - 15.5 %   Platelets 260.0  150.0 - 400.0  K/uL   Neutrophils Relative % 49.8  43.0 - 77.0 %   Lymphocytes Relative 37.9  12.0 - 46.0 %   Monocytes Relative 7.4  3.0 - 12.0 %   Eosinophils Relative 4.1  0.0 - 5.0 %   Basophils Relative 0.8  0.0 - 3.0 %   Neutro Abs 2.7  1.4 - 7.7 K/uL   Lymphs Abs 2.0  0.7 - 4.0 K/uL   Monocytes Absolute 0.4  0.1 - 1.0 K/uL   Eosinophils Absolute 0.2  0.0 - 0.7 K/uL   Basophils Absolute 0.0  0.0 - 0.1 K/uL  COMPREHENSIVE METABOLIC PANEL     Status: Abnormal   Collection Time    11/19/13  1:27 PM      Result Value Ref Range   Sodium 138  135 - 145 mEq/L   Potassium 3.9  3.5 - 5.1 mEq/L   Chloride 104  96 - 112 mEq/L   CO2 29  19 - 32 mEq/L   Glucose, Bld 81  70 - 99 mg/dL   BUN 24 (*) 6 - 23 mg/dL   Creatinine, Ser 0.7  0.4 - 1.2 mg/dL   Total Bilirubin 0.3  0.2 - 1.2 mg/dL   Alkaline Phosphatase 104  39 - 117 U/L   AST 32  0 - 37 U/L   ALT 26  0 - 35 U/L   Total Protein 7.0  6.0 - 8.3 g/dL   Albumin 4.1  3.5 - 5.2 g/dL   Calcium 9.1  8.4 - 10.5 mg/dL   GFR 83.00  >60.00 mL/min  CBC WITH DIFFERENTIAL  Status: None   Collection Time    12/26/13  1:39 PM      Result Value Ref Range   WBC 5.8  4.0 - 10.5 K/uL   RBC 4.34  3.87 - 5.11 Mil/uL   Hemoglobin 13.3  12.0 - 15.0 g/dL   HCT 40.4  36.0 - 46.0 %   MCV 93.1  78.0 - 100.0 fl   MCHC 33.0  30.0 - 36.0 g/dL   RDW 14.3  11.5 - 15.5 %   Platelets 255.0  150.0 - 400.0 K/uL   Neutrophils Relative % 59.5  43.0 - 77.0 %   Lymphocytes Relative 32.5  12.0 - 46.0 %   Monocytes Relative 4.1  3.0 - 12.0 %   Eosinophils Relative 3.5  0.0 - 5.0 %   Basophils Relative 0.4  0.0 - 3.0 %   Neutro Abs 3.4  1.4 - 7.7 K/uL   Lymphs Abs 1.9  0.7 - 4.0 K/uL   Monocytes Absolute 0.2  0.1 - 1.0 K/uL   Eosinophils Absolute 0.2  0.0 - 0.7 K/uL   Basophils Absolute 0.0  0.0 - 0.1 K/uL  COMPREHENSIVE METABOLIC PANEL     Status: Abnormal   Collection Time    12/26/13  1:39 PM      Result Value Ref Range   Sodium 137  135 - 145 mEq/L   Potassium 3.8   3.5 - 5.1 mEq/L   Chloride 104  96 - 112 mEq/L   CO2 26  19 - 32 mEq/L   Glucose, Bld 118 (*) 70 - 99 mg/dL   BUN 16  6 - 23 mg/dL   Creatinine, Ser 0.7  0.4 - 1.2 mg/dL   Total Bilirubin 0.5  0.2 - 1.2 mg/dL   Alkaline Phosphatase 108  39 - 117 U/L   AST 34  0 - 37 U/L   ALT 27  0 - 35 U/L   Total Protein 7.3  6.0 - 8.3 g/dL   Albumin 4.1  3.5 - 5.2 g/dL   Calcium 8.2 (*) 8.4 - 10.5 mg/dL   GFR 93.37  >60.00 mL/min     RADIOLOGY RESULTS: See E-Chart or I-Site for most recent results.  Images and reports are reviewed.  Mm Digital Diagnostic Unilat R  12/03/2013   CLINICAL DATA:  Spontaneous bloody right nipple discharge.  EXAM: DIGITAL DIAGNOSTIC  RIGHT MAMMOGRAM WITH CAD  COMPARISON:  With priors.  ACR Breast Density Category c: The breast tissue is heterogeneously dense, which may obscure small masses.  FINDINGS: No suspicious mass, malignant type microcalcifications or distortion detected.  On physical exam, I do not palpate a mass in the right breast. Bloody nipple discharge could be expressed from a single duct.  Mammographic images were processed with CAD.  IMPRESSION: Concerning right bloody nipple discharge.  RECOMMENDATION: A ductogram will be performed and dictated separately.  I have discussed the findings and recommendations with the patient. Results were also provided in writing at the conclusion of the visit. If applicable, a reminder letter will be sent to the patient regarding the next appointment.  BI-RADS CATEGORY  0: Incomplete. Need additional imaging evaluation and/or prior mammograms for comparison.   Electronically Signed   By: Lillia Mountain M.D.   On: 12/03/2013 14:01   Mm Ductogram Unilateral Right  12/03/2013   CLINICAL DATA:  Spontaneous right bloody nipple discharge.  EXAM: DUCTOGRAM UNILATERAL*R*  FINDINGS: On physical exam, I do not palpate a mass in the right breast. Bloody nipple  discharge could be expressed from a single duct. The nipple was cleansed with alcohol  and a sialogram catheter was placed into the orifice of the duct where nipple discharge could be expressed. Contrast material was injected. Mammographic images were obtained. There is an intraductal mass located in the 12 o'clock subareolar region of the breast located approximately 2 cm from the nipple.  IMPRESSION: Intraductal mass concerning for a papilloma, but malignancy cannot be excluded. Excision is recommended.  RECOMMENDATION: Surgical excision of the right breast intraductal mass is recommended. Surgical consultation has been scheduled with Dr. Barry Dienes on 12/29/2013. Results were discussed with the patient.  4: Suspicious.   Electronically Signed   By: Lillia Mountain M.D.   On: 12/03/2013 13:59      ASSESSMENT AND PLAN: Bloody discharge from nipple Patient has bloody nipple discharge. 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 However these can be associated with intraductal carcinomas or invasive carcinomas. We will excise this for final pathology. I discussed with the patient that there is a risk of cancer in the breast.   I reviewed the procedure of major duct excision. I discussed that we would take her to the operating room and cannulate the duct with the discharge with a small probe. I would then make a superior circumareolar incision and excise a cylinder of tissue. I discussed it would take several days for the pathology to come back.  Reviewed risks bleeding infection. She is not on any blood thinners.    Discussed that this is an outpatient procedure.    30 min spent with patient.       Milus Height MD Surgical Oncology, General and [facility]      Visit Diagnoses: 1. Bloody discharge from nipple     Primary Care Physician: Kathlene November, MD  Other care team [place]

## 2014-01-15 NOTE — Anesthesia Postprocedure Evaluation (Signed)
  Anesthesia Post-op Note  Patient: [name]  Procedure(s) Performed: Procedure(s): EXCISION DUCTAL SYSTEM RIGHT BREAST (Right)  Patient Location: PACU  Anesthesia Type:General  Level of Consciousness: awake and alert   Airway and Oxygen Therapy: Patient Spontanous Breathing  Post-op Pain: none  Post-op Assessment: Post-op Vital signs reviewed, Patient's Cardiovascular Status Stable and Respiratory Function Stable  Post-op Vital Signs: Reviewed  Filed Vitals:   01/15/14 1345  BP: 146/71  Pulse: 79  Temp:   Resp: 12    Complications: No apparent anesthesia complications

## 2014-01-15 NOTE — Op Note (Signed)
Major Duct Excision right breast  Indications: This patient presents with history of right bloody nipple discharge  Pre-operative Diagnosis: Right bloody nipple discharge  Post-operative Diagnosis: Same  Surgeon: Stark Klein   Anesthesia: General LMA anesthesia and Local anesthesia 1% plain lidocaine, 0.25.% bupivacaine, with epinephrine  ASA Class: 3  Procedure Details  The patient was seen in the Holding Room. The risks, benefits, complications, treatment options, and expected outcomes were discussed with the patient. The possibilities of reaction to medication, pulmonary aspiration, bleeding, infection, the need for additional procedures, failure to diagnose a condition, and creating a complication requiring transfusion or operation were discussed with the patient. The patient concurred with the proposed plan, giving informed consent.  The site of surgery properly noted/marked. The patient was taken to Operating Room # 1, identified, and the procedure verified as Breast Excisional Biopsy. A Time Out was held and the above information confirmed.  After induction of anesthesia, the right  breast and chest were prepped and draped in standard fashion.  The lacrimal duct probe was used to identify the appropriate tract.   The lumpectomy was performed by creating an oblique incision over the lower outer quadrant of the breast.    Dissection was carried down around the lacrimal duct probe sharply and with cautery.  The tissue was taken off the back of the nipple.  Orientation sutures were placed in the specimen  Hemostasis was achieved with cautery.  The wound was irrigated and closed with a 3-0 Vicryl interrupted deep dermal stitches and a 4-0 Monocryl subcuticular closure in layers.    Sterile dressings were applied. At the end of the operation, all sponge, instrument, and needle counts were correct.  Findings: grossly clear surgical margins  Estimated Blood Loss:  Minimal              Specimens: right breast major duct excision         Complications:  None; patient tolerated the procedure well.         Disposition: PACU - hemodynamically stable.         Condition: stable

## 2014-01-15 NOTE — Transfer of Care (Signed)
Immediate Anesthesia Transfer of Care Note  Patient: [name]  Procedure(s) Performed: Procedure(s): EXCISION DUCTAL SYSTEM RIGHT BREAST (Right)  Patient Location: PACU  Anesthesia Type:General  Level of Consciousness: awake and alert   Airway & Oxygen Therapy: Patient Spontanous Breathing and Patient connected to face mask oxygen  Post-op Assessment: Report given to PACU RN  Post vital signs: Reviewed and stable  Complications: No apparent anesthesia complications

## 2014-01-16 ENCOUNTER — Encounter (HOSPITAL_BASED_OUTPATIENT_CLINIC_OR_DEPARTMENT_OTHER): Payer: Self-pay | Admitting: General Surgery

## 2014-01-16 NOTE — Addendum Note (Signed)
Addendum created 01/16/14 1219 by Tawni Millers, CRNA   Modules edited: Charges VN

## 2014-01-18 ENCOUNTER — Encounter: Payer: Self-pay | Admitting: Internal Medicine

## 2014-01-19 ENCOUNTER — Encounter (INDEPENDENT_AMBULATORY_CARE_PROVIDER_SITE_OTHER): Payer: Self-pay | Admitting: General Surgery

## 2014-01-19 ENCOUNTER — Other Ambulatory Visit (INDEPENDENT_AMBULATORY_CARE_PROVIDER_SITE_OTHER): Payer: Medicare Other

## 2014-01-19 ENCOUNTER — Encounter (INDEPENDENT_AMBULATORY_CARE_PROVIDER_SITE_OTHER): Payer: Self-pay

## 2014-01-19 DIAGNOSIS — Z79899 Other long term (current) drug therapy: Secondary | ICD-10-CM | POA: Diagnosis not present

## 2014-01-19 NOTE — Progress Notes (Signed)
Quick Note:  Please let patient know that pathology is benign. ______ 

## 2014-01-20 ENCOUNTER — Encounter: Payer: Self-pay | Admitting: Internal Medicine

## 2014-01-20 LAB — CBC WITH DIFFERENTIAL/PLATELET
Basophils Absolute: 0 10*3/uL (ref 0.0–0.1)
Basophils Relative: 0.6 % (ref 0.0–3.0)
Eosinophils Absolute: 0.2 10*3/uL (ref 0.0–0.7)
Eosinophils Relative: 4.1 % (ref 0.0–5.0)
HCT: 40 % (ref 36.0–46.0)
Hemoglobin: 13.4 g/dL (ref 12.0–15.0)
Lymphocytes Relative: 39.1 % (ref 12.0–46.0)
Lymphs Abs: 2.1 10*3/uL (ref 0.7–4.0)
MCHC: 33.5 g/dL (ref 30.0–36.0)
MCV: 92.6 fl (ref 78.0–100.0)
Monocytes Absolute: 0.3 10*3/uL (ref 0.1–1.0)
Monocytes Relative: 4.9 % (ref 3.0–12.0)
Neutro Abs: 2.8 10*3/uL (ref 1.4–7.7)
Neutrophils Relative %: 51.3 % (ref 43.0–77.0)
Platelets: 269 10*3/uL (ref 150.0–400.0)
RBC: 4.32 Mil/uL (ref 3.87–5.11)
RDW: 14.6 % (ref 11.5–15.5)
WBC: 5.4 10*3/uL (ref 4.0–10.5)

## 2014-01-20 LAB — COMPREHENSIVE METABOLIC PANEL
ALT: 20 U/L (ref 0–35)
AST: 29 U/L (ref 0–37)
Albumin: 4.1 g/dL (ref 3.5–5.2)
Alkaline Phosphatase: 96 U/L (ref 39–117)
BUN: 16 mg/dL (ref 6–23)
CO2: 26 mEq/L (ref 19–32)
Calcium: 8.2 mg/dL — ABNORMAL LOW (ref 8.4–10.5)
Chloride: 104 mEq/L (ref 96–112)
Creatinine, Ser: 0.7 mg/dL (ref 0.4–1.2)
GFR: 85.71 mL/min (ref >60.00)
Glucose, Bld: 51 mg/dL — ABNORMAL LOW (ref 70–99)
Potassium: 4.4 mEq/L (ref 3.5–5.1)
Sodium: 138 mEq/L (ref 135–145)
Total Bilirubin: 0.5 mg/dL (ref 0.2–1.2)
Total Protein: 7.5 g/dL (ref 6.0–8.3)

## 2014-01-27 ENCOUNTER — Other Ambulatory Visit (INDEPENDENT_AMBULATORY_CARE_PROVIDER_SITE_OTHER): Payer: Medicare Other

## 2014-01-27 DIAGNOSIS — Z79899 Other long term (current) drug therapy: Secondary | ICD-10-CM | POA: Diagnosis not present

## 2014-01-28 ENCOUNTER — Encounter (INDEPENDENT_AMBULATORY_CARE_PROVIDER_SITE_OTHER): Payer: Self-pay | Admitting: General Surgery

## 2014-01-28 LAB — CBC WITH DIFFERENTIAL/PLATELET
Basophils Absolute: 0.1 10*3/uL (ref 0.0–0.1)
Basophils Relative: 1.1 % (ref 0.0–3.0)
Eosinophils Absolute: 0.2 10*3/uL (ref 0.0–0.7)
Eosinophils Relative: 4.4 % (ref 0.0–5.0)
HCT: 38.8 % (ref 36.0–46.0)
Hemoglobin: 13 g/dL (ref 12.0–15.0)
Lymphocytes Relative: 37.5 % (ref 12.0–46.0)
Lymphs Abs: 1.9 10*3/uL (ref 0.7–4.0)
MCHC: 33.6 g/dL (ref 30.0–36.0)
MCV: 92.9 fl (ref 78.0–100.0)
Monocytes Absolute: 0.4 10*3/uL (ref 0.1–1.0)
Monocytes Relative: 7.6 % (ref 3.0–12.0)
Neutro Abs: 2.5 10*3/uL (ref 1.4–7.7)
Neutrophils Relative %: 49.4 % (ref 43.0–77.0)
Platelets: 290 10*3/uL (ref 150.0–400.0)
RBC: 4.17 Mil/uL (ref 3.87–5.11)
RDW: 14.1 % (ref 11.5–15.5)
WBC: 5 10*3/uL (ref 4.0–10.5)

## 2014-01-28 LAB — COMPREHENSIVE METABOLIC PANEL
ALT: 22 U/L (ref 0–35)
AST: 29 U/L (ref 0–37)
Albumin: 3.7 g/dL (ref 3.5–5.2)
Alkaline Phosphatase: 79 U/L (ref 39–117)
BUN: 20 mg/dL (ref 6–23)
CO2: 29 mEq/L (ref 19–32)
Calcium: 8.4 mg/dL (ref 8.4–10.5)
Chloride: 106 mEq/L (ref 96–112)
Creatinine, Ser: 0.6 mg/dL (ref 0.4–1.2)
GFR: 98.58 mL/min (ref >60.00)
Glucose, Bld: 68 mg/dL — ABNORMAL LOW (ref 70–99)
Potassium: 4.7 mEq/L (ref 3.5–5.1)
Sodium: 140 mEq/L (ref 135–145)
Total Bilirubin: 0.6 mg/dL (ref 0.2–1.2)
Total Protein: 6.8 g/dL (ref 6.0–8.3)

## 2014-02-03 ENCOUNTER — Ambulatory Visit (AMBULATORY_SURGERY_CENTER): Payer: Medicare Other | Admitting: Internal Medicine

## 2014-02-03 ENCOUNTER — Encounter: Payer: Self-pay | Admitting: Internal Medicine

## 2014-02-03 VITALS — BP 142/70 | HR 78 | Temp 97.8°F | Resp 20 | Ht 62.0 in | Wt 129.0 lb

## 2014-02-03 DIAGNOSIS — Z800 Family history of malignant neoplasm of digestive organs: Secondary | ICD-10-CM | POA: Diagnosis not present

## 2014-02-03 DIAGNOSIS — Z1211 Encounter for screening for malignant neoplasm of colon: Secondary | ICD-10-CM | POA: Diagnosis not present

## 2014-02-03 DIAGNOSIS — R195 Other fecal abnormalities: Secondary | ICD-10-CM

## 2014-02-03 DIAGNOSIS — Z86010 Personal history of colonic polyps: Secondary | ICD-10-CM

## 2014-02-03 MED ORDER — SODIUM CHLORIDE 0.9 % IV SOLN: 500.0000 mL | INTRAVENOUS | Status: DC

## 2014-02-03 NOTE — Progress Notes (Signed)
A/ox3 pleased with MAC, report to Celia RN 

## 2014-02-03 NOTE — Op Note (Signed)
[place]  [address] [place], [id number]   COLONOSCOPY PROCEDURE REPORT  PATIENT: [name], [name]  MR#: [id number] BIRTHDATE: 30-Jan-1933 , 81  yrs. old GENDER: Female ENDOSCOPIST: Jerene Bears, MD PROCEDURE DATE:  02/03/2014 PROCEDURE:   Colonoscopy, surveillance First Screening Colonoscopy - Avg.  risk and is 50 yrs.  old or older - No.  Prior Negative Screening - Now for repeat screening. N/A  History of Adenoma - Now for follow-up colonoscopy & has been > or = to 3 yrs.  No.  It has been less than 3 yrs since last colonoscopy.  Medical reason.  Polyps Removed Today? No.  Recommend repeat exam, <10 yrs? No. ASA CLASS:   Class III INDICATIONS: Positive Cologuard; Last colonoscopy performed 2013. MEDICATIONS: MAC sedation, administered by CRNA and propofol (Diprivan) 200mg  IV  DESCRIPTION OF PROCEDURE:   After the risks benefits and alternatives of the procedure were thoroughly explained, informed consent was obtained.  A digital rectal exam revealed no rectal mass.   The LB PFC-H190 [id number]  endoscope was introduced through the anus and advanced to the terminal ileum which was intubated for a short distance. No adverse events experienced.   The quality of the prep was good, using MoviPrep  The instrument was then slowly withdrawn as the colon was fully examined.   COLON FINDINGS: The mucosa appeared normal in the neo-terminal ileum.   There was evidence of a normal appearing prior surgical anastomosis in the right colon.   The colonic mucosa appeared normal throughout the entire examined colon.  Retroflexed views revealed small external hemorrhoids. No polyps or tumors were seen. The time to cecum=6 minutes 44 seconds.  Withdrawal time=13 minutes 17 seconds.  The scope was withdrawn and the procedure completed.  COMPLICATIONS: There were no complications.  ENDOSCOPIC IMPRESSION: 1.   Normal mucosa in the terminal ileum 2.   There was evidence of  prior surgical anastomosis in the left colon 3.   The colonic mucosa appeared normal throughout the entire examined colon  RECOMMENDATIONS: Continue current medications   eSigned:  Jerene Bears, MD 02/03/2014 3:17 PM   cc: The Patient and Kathlene November, MD

## 2014-02-03 NOTE — Patient Instructions (Signed)
Discharge instructions given with verbal understanding. Normal exam. Resume previous medications. YOU HAD AN ENDOSCOPIC PROCEDURE TODAY AT THE  ENDOSCOPY CENTER: Refer to the procedure report that was given to you for any specific questions about what was found during the examination.  If the procedure report does not answer your questions, please call your gastroenterologist to clarify.  If you requested that your care partner not be given the details of your procedure findings, then the procedure report has been included in a sealed envelope for you to review at your convenience later.  YOU SHOULD EXPECT: Some feelings of bloating in the abdomen. Passage of more gas than usual.  Walking can help get rid of the air that was put into your GI tract during the procedure and reduce the bloating. If you had a lower endoscopy (such as a colonoscopy or flexible sigmoidoscopy) you may notice spotting of blood in your stool or on the toilet paper. If you underwent a bowel prep for your procedure, then you may not have a normal bowel movement for a few days.  DIET: Your first meal following the procedure should be a light meal and then it is ok to progress to your normal diet.  A half-sandwich or bowl of soup is an example of a good first meal.  Heavy or fried foods are harder to digest and may make you feel nauseous or bloated.  Likewise meals heavy in dairy and vegetables can cause extra gas to form and this can also increase the bloating.  Drink plenty of fluids but you should avoid alcoholic beverages for 24 hours.  ACTIVITY: Your care partner should take you home directly after the procedure.  You should plan to take it easy, moving slowly for the rest of the day.  You can resume normal activity the day after the procedure however you should NOT DRIVE or use heavy machinery for 24 hours (because of the sedation medicines used during the test).    SYMPTOMS TO REPORT IMMEDIATELY: A gastroenterologist  can be reached at any hour.  During normal business hours, 8:30 AM to 5:00 PM Monday through Friday, call [phone].  After hours and on weekends, please call the GI answering service at [phone] who will take a message and have the physician on call contact you.   Following lower endoscopy (colonoscopy or flexible sigmoidoscopy):  Excessive amounts of blood in the stool  Significant tenderness or worsening of abdominal pains  Swelling of the abdomen that is new, acute  Fever of 100F or higher  FOLLOW UP: If any biopsies were taken you will be contacted by phone or by letter within the next 1-3 weeks.  Call your gastroenterologist if you have not heard about the biopsies in 3 weeks.  Our staff will call the home number listed on your records the next business day following your procedure to check on you and address any questions or concerns that you may have at that time regarding the information given to you following your procedure. This is a courtesy call and so if there is no answer at the home number and we have not heard from you through the emergency physician on call, we will assume that you have returned to your regular daily activities without incident.  SIGNATURES/CONFIDENTIALITY: You and/or your care partner have signed paperwork which will be entered into your electronic medical record.  These signatures attest to the fact that that the information above on your After Visit Summary has been reviewed   and is understood.  Full responsibility of the confidentiality of this discharge information lies with you and/or your care-partner. 

## 2014-02-04 ENCOUNTER — Encounter: Payer: Self-pay | Admitting: Internal Medicine

## 2014-02-04 ENCOUNTER — Telehealth: Payer: Self-pay

## 2014-02-04 ENCOUNTER — Other Ambulatory Visit: Payer: Self-pay

## 2014-02-04 DIAGNOSIS — R195 Other fecal abnormalities: Secondary | ICD-10-CM

## 2014-02-04 NOTE — Telephone Encounter (Signed)
  Follow up Call-  Call back number 02/03/2014 01/16/2012 06/12/2011  Post procedure Call Back phone  # [phone] leave message  Permission to leave phone message Yes Yes -     Patient questions:  Do you have a fever, pain , or abdominal swelling? No. Pain Score  0 *  Have you tolerated food without any problems? Yes.    Have you been able to return to your normal activities? Yes.    Do you have any questions about your discharge instructions: Diet   Yes.   Medications  No. Follow up visit  No.  Do you have questions or concerns about your Care? No.  Actions: * If pain score is 4 or above: No action needed, pain <4.

## 2014-02-04 NOTE — Telephone Encounter (Signed)
Patient would prefer to have CMP and CBC with differential performed at my office [facility]) and then faxed to her neurologist at [facility], Dr. Venetia Maxon. I am okay with this. Please order CMP and CBC with diff for 02/10/2014 and fax to Dr. Venetia Maxon at [phone], cancel labs for Dr. Larose Kells Thanks Will await word on freq of labs per Dr. Venetia Maxon

## 2014-02-09 ENCOUNTER — Encounter (INDEPENDENT_AMBULATORY_CARE_PROVIDER_SITE_OTHER): Payer: Medicare Other | Admitting: General Surgery

## 2014-02-09 ENCOUNTER — Encounter: Payer: Self-pay | Admitting: Internal Medicine

## 2014-02-10 ENCOUNTER — Other Ambulatory Visit: Payer: Medicare Other

## 2014-02-10 ENCOUNTER — Encounter: Payer: Self-pay | Admitting: Internal Medicine

## 2014-02-11 ENCOUNTER — Other Ambulatory Visit: Payer: Self-pay

## 2014-02-11 ENCOUNTER — Encounter: Payer: Self-pay | Admitting: Internal Medicine

## 2014-02-11 DIAGNOSIS — Z79899 Other long term (current) drug therapy: Secondary | ICD-10-CM

## 2014-02-12 ENCOUNTER — Other Ambulatory Visit (INDEPENDENT_AMBULATORY_CARE_PROVIDER_SITE_OTHER): Payer: Medicare Other

## 2014-02-12 DIAGNOSIS — Z79899 Other long term (current) drug therapy: Secondary | ICD-10-CM | POA: Diagnosis not present

## 2014-02-12 LAB — COMPREHENSIVE METABOLIC PANEL
ALT: 22 U/L (ref 0–35)
AST: 27 U/L (ref 0–37)
Albumin: 3.9 g/dL (ref 3.5–5.2)
Alkaline Phosphatase: 94 U/L (ref 39–117)
BUN: 18 mg/dL (ref 6–23)
CO2: 26 mEq/L (ref 19–32)
Calcium: 8.4 mg/dL (ref 8.4–10.5)
Chloride: 105 mEq/L (ref 96–112)
Creatinine, Ser: 0.7 mg/dL (ref 0.4–1.2)
GFR: 81.64 mL/min (ref >60.00)
Glucose, Bld: 95 mg/dL (ref 70–99)
Potassium: 4.6 mEq/L (ref 3.5–5.1)
Sodium: 141 mEq/L (ref 135–145)
Total Bilirubin: 0.2 mg/dL (ref 0.2–1.2)
Total Protein: 7.5 g/dL (ref 6.0–8.3)

## 2014-02-12 LAB — CBC WITH DIFFERENTIAL/PLATELET
Basophils Absolute: 0 10*3/uL (ref 0.0–0.1)
Basophils Relative: 0.5 % (ref 0.0–3.0)
Eosinophils Absolute: 0.2 10*3/uL (ref 0.0–0.7)
Eosinophils Relative: 4 % (ref 0.0–5.0)
HCT: 39.4 % (ref 36.0–46.0)
Hemoglobin: 13.3 g/dL (ref 12.0–15.0)
Lymphocytes Relative: 35.7 % (ref 12.0–46.0)
Lymphs Abs: 2.1 10*3/uL (ref 0.7–4.0)
MCHC: 33.7 g/dL (ref 30.0–36.0)
MCV: 93.2 fl (ref 78.0–100.0)
Monocytes Absolute: 0.5 10*3/uL (ref 0.1–1.0)
Monocytes Relative: 9 % (ref 3.0–12.0)
Neutro Abs: 3 10*3/uL (ref 1.4–7.7)
Neutrophils Relative %: 50.8 % (ref 43.0–77.0)
Platelets: 296 10*3/uL (ref 150.0–400.0)
RBC: 4.23 Mil/uL (ref 3.87–5.11)
RDW: 13.9 % (ref 11.5–15.5)
WBC: 5.9 10*3/uL (ref 4.0–10.5)

## 2014-02-12 NOTE — Telephone Encounter (Signed)
Per Dr. Hilarie Fredrickson: If she would like to try Dr Ernie Hew for primary care, I have heard she is very good and patients have told me they like her a lot

## 2014-02-12 NOTE — Telephone Encounter (Signed)
If she would like to try Dr Ernie Hew for primary care, I have heard she is very good and patients have told me they like her a lot

## 2014-02-17 ENCOUNTER — Telehealth: Payer: Self-pay

## 2014-02-17 ENCOUNTER — Other Ambulatory Visit: Payer: Self-pay | Admitting: Internal Medicine

## 2014-02-17 MED ORDER — PHENOBARBITAL 32.4 MG PO TABS: 32.4000 mg | ORAL_TABLET | Freq: Four times a day (QID) | ORAL | Status: DC

## 2014-02-17 NOTE — Telephone Encounter (Signed)
Rx faxed to [facility].

## 2014-02-17 NOTE — Telephone Encounter (Signed)
Pt is requesting refill on Phenobarbital.   Last OV: 12/12/2013 Last Fill: 07/25/2013 # 120 5 RF UDS: None   Please advise.

## 2014-02-17 NOTE — Telephone Encounter (Signed)
done

## 2014-02-19 DIAGNOSIS — M5030 Other cervical disc degeneration, unspecified cervical region: Secondary | ICD-10-CM | POA: Diagnosis not present

## 2014-02-19 DIAGNOSIS — M4802 Spinal stenosis, cervical region: Secondary | ICD-10-CM | POA: Diagnosis not present

## 2014-02-19 DIAGNOSIS — Z981 Arthrodesis status: Secondary | ICD-10-CM | POA: Diagnosis not present

## 2014-02-19 DIAGNOSIS — Q762 Congenital spondylolisthesis: Secondary | ICD-10-CM | POA: Diagnosis not present

## 2014-02-24 ENCOUNTER — Other Ambulatory Visit: Payer: Medicare Other

## 2014-03-12 ENCOUNTER — Telehealth (INDEPENDENT_AMBULATORY_CARE_PROVIDER_SITE_OTHER): Payer: Medicare Other | Admitting: Internal Medicine

## 2014-03-12 DIAGNOSIS — Z79899 Other long term (current) drug therapy: Secondary | ICD-10-CM | POA: Diagnosis not present

## 2014-03-12 DIAGNOSIS — Z79891 Long term (current) use of opiate analgesic: Secondary | ICD-10-CM | POA: Diagnosis not present

## 2014-03-12 DIAGNOSIS — Z23 Encounter for immunization: Secondary | ICD-10-CM

## 2014-03-12 DIAGNOSIS — G6181 Chronic inflammatory demyelinating polyneuritis: Secondary | ICD-10-CM

## 2014-03-12 MED ORDER — AMPHETAMINE-DEXTROAMPHETAMINE 10 MG PO TABS: 10.0000 mg | ORAL_TABLET | Freq: Four times a day (QID) | ORAL | Status: DC

## 2014-03-12 NOTE — Telephone Encounter (Signed)
Okay refill, UDS at the time of rx pick up

## 2014-03-12 NOTE — Telephone Encounter (Signed)
Pt is requesting refill on Adderall.  Last OV: 12/12/2013 Last Fill: 01/05/2014 # 120 0 RF Pt takes 1 tablet 4 times daily.  UDS: None  Please Advise.

## 2014-03-12 NOTE — Telephone Encounter (Signed)
Adderall 10 mg Pt is out of rx

## 2014-03-12 NOTE — Telephone Encounter (Signed)
Spoke with Pt, informed her that medication is ready for pick up.

## 2014-03-16 DIAGNOSIS — Z23 Encounter for immunization: Secondary | ICD-10-CM

## 2014-03-16 NOTE — Addendum Note (Signed)
Addended by: Peggyann Shoals on: 03/16/2014 04:23 PM   Modules accepted: Orders

## 2014-04-01 DIAGNOSIS — N8189 Other female genital prolapse: Secondary | ICD-10-CM | POA: Diagnosis not present

## 2014-04-01 DIAGNOSIS — R32 Unspecified urinary incontinence: Secondary | ICD-10-CM | POA: Diagnosis not present

## 2014-04-02 DIAGNOSIS — R32 Unspecified urinary incontinence: Secondary | ICD-10-CM | POA: Diagnosis not present

## 2014-04-06 ENCOUNTER — Other Ambulatory Visit (INDEPENDENT_AMBULATORY_CARE_PROVIDER_SITE_OTHER): Payer: Medicare Other

## 2014-04-06 DIAGNOSIS — Z79899 Other long term (current) drug therapy: Secondary | ICD-10-CM

## 2014-04-06 LAB — CBC WITH DIFFERENTIAL/PLATELET
Basophils Absolute: 0 10*3/uL (ref 0.0–0.1)
Basophils Relative: 0.5 % (ref 0.0–3.0)
Eosinophils Absolute: 0.2 10*3/uL (ref 0.0–0.7)
Eosinophils Relative: 2.8 % (ref 0.0–5.0)
HCT: 40.7 % (ref 36.0–46.0)
Hemoglobin: 13.3 g/dL (ref 12.0–15.0)
Lymphocytes Relative: 22.9 % (ref 12.0–46.0)
Lymphs Abs: 1.7 10*3/uL (ref 0.7–4.0)
MCHC: 32.6 g/dL (ref 30.0–36.0)
MCV: 94.3 fl (ref 78.0–100.0)
Monocytes Absolute: 0.4 10*3/uL (ref 0.1–1.0)
Monocytes Relative: 5.8 % (ref 3.0–12.0)
Neutro Abs: 5.1 10*3/uL (ref 1.4–7.7)
Neutrophils Relative %: 68 % (ref 43.0–77.0)
Platelets: 276 10*3/uL (ref 150.0–400.0)
RBC: 4.32 Mil/uL (ref 3.87–5.11)
RDW: 13.8 % (ref 11.5–15.5)
WBC: 7.5 10*3/uL (ref 4.0–10.5)

## 2014-04-06 LAB — COMPREHENSIVE METABOLIC PANEL
ALT: 23 U/L (ref 0–35)
AST: 28 U/L (ref 0–37)
Albumin: 3.5 g/dL (ref 3.5–5.2)
Alkaline Phosphatase: 75 U/L (ref 39–117)
BUN: 16 mg/dL (ref 6–23)
CO2: 24 mEq/L (ref 19–32)
Calcium: 8.2 mg/dL — ABNORMAL LOW (ref 8.4–10.5)
Chloride: 108 mEq/L (ref 96–112)
Creatinine, Ser: 0.6 mg/dL (ref 0.4–1.2)
GFR: 100.4 mL/min (ref >60.00)
Glucose, Bld: 81 mg/dL (ref 70–99)
Potassium: 4 mEq/L (ref 3.5–5.1)
Sodium: 142 mEq/L (ref 135–145)
Total Bilirubin: 0.6 mg/dL (ref 0.2–1.2)
Total Protein: 7.2 g/dL (ref 6.0–8.3)

## 2014-04-09 DIAGNOSIS — J449 Chronic obstructive pulmonary disease, unspecified: Secondary | ICD-10-CM | POA: Diagnosis not present

## 2014-04-09 DIAGNOSIS — J45909 Unspecified asthma, uncomplicated: Secondary | ICD-10-CM | POA: Diagnosis not present

## 2014-04-09 DIAGNOSIS — M19041 Primary osteoarthritis, right hand: Secondary | ICD-10-CM | POA: Diagnosis not present

## 2014-04-09 DIAGNOSIS — M79641 Pain in right hand: Secondary | ICD-10-CM | POA: Diagnosis not present

## 2014-04-09 DIAGNOSIS — M79642 Pain in left hand: Secondary | ICD-10-CM | POA: Diagnosis not present

## 2014-04-09 DIAGNOSIS — M19042 Primary osteoarthritis, left hand: Secondary | ICD-10-CM | POA: Diagnosis not present

## 2014-04-14 ENCOUNTER — Encounter: Payer: Self-pay | Admitting: Internal Medicine

## 2014-04-14 ENCOUNTER — Ambulatory Visit (INDEPENDENT_AMBULATORY_CARE_PROVIDER_SITE_OTHER): Payer: Medicare Other | Admitting: Internal Medicine

## 2014-04-14 ENCOUNTER — Other Ambulatory Visit: Payer: Self-pay | Admitting: Internal Medicine

## 2014-04-14 VITALS — BP 133/76 | HR 89 | Temp 97.6°F | Wt 129.5 lb

## 2014-04-14 DIAGNOSIS — G4710 Hypersomnia, unspecified: Secondary | ICD-10-CM | POA: Diagnosis not present

## 2014-04-14 DIAGNOSIS — N319 Neuromuscular dysfunction of bladder, unspecified: Secondary | ICD-10-CM | POA: Diagnosis not present

## 2014-04-14 DIAGNOSIS — M542 Cervicalgia: Secondary | ICD-10-CM | POA: Diagnosis not present

## 2014-04-14 DIAGNOSIS — I10 Essential (primary) hypertension: Secondary | ICD-10-CM | POA: Diagnosis not present

## 2014-04-14 DIAGNOSIS — G8929 Other chronic pain: Secondary | ICD-10-CM

## 2014-04-14 MED ORDER — ZOSTER VACCINE LIVE 19400 UNT/0.65ML ~~LOC~~ SOLR: 0.6500 mL | Freq: Once | SUBCUTANEOUS | Status: DC

## 2014-04-14 NOTE — Progress Notes (Signed)
Pre visit review using our clinic review tool, if applicable. No additional management support is needed unless otherwise documented below in the visit note. 

## 2014-04-14 NOTE — Assessment & Plan Note (Signed)
Continue amlodipine, last BMP satisfactory

## 2014-04-14 NOTE — Assessment & Plan Note (Signed)
Currently doing well, not needing any narcotic pain medication

## 2014-04-14 NOTE — Assessment & Plan Note (Signed)
Good compliance with Adderall, UDS today, refill when necessary

## 2014-04-14 NOTE — Patient Instructions (Signed)
Please go to the lab and provide a sample for a UDS  Next visit in 3-4 months for a physical, please make an appointment, come back  fasting

## 2014-04-14 NOTE — Progress Notes (Signed)
Subjective:    Patient ID: [name], female    DOB: [birth year], 80 y.o.   MRN: [id number]  DOS:  04/14/2014 Type of visit - description : rov Interval history: Routine check up, here with her husband Had a breast biopsy 3 months ago --->  benign Pain management, currently doing great, does not need any strong  medication; she does have leg pain but her doctors at [facility] increased  the dose of Tegretol Hypersomnia , symptoms well-controlled with Adderall, does not need a refill today Recently saw gynecology, diagnosed with a UTI.    ROS Denies chest pain or difficulty breathing No nausea, vomiting, diarrhea Labs reviewed, not due for any blood work today  Past Medical History  Diagnosis Date  . Anemia   . Asthma -COPD   . Chronic fatigue syndrome   . GERD (gastroesophageal reflux disease)   . Hyperlipidemia   . Hypertension   . Colon polyp   . Allergic rhinitis   . Vitamin D deficiency   . Pulmonary embolism     1980s  . Neuropathy   . Arthritis   . 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 Blood transfusion 1957  . Osteoporosis   . Hypothyroid   . Constipation   . Seizures     1990 last seizures on meds Phenobarb  . Skin cancer     squamous cell  . Neurogenic bladder 2013    husband caths pt  . chronic sinusitis 08/08/2012  . Shingles late 61's  . Cystocele 09/16/2012  . Tubular adenoma   . Breast mass     right breast in milk duct, bx neg    Past Surgical History  Procedure Laterality Date  . Tonsillectomy and adenoidectomy    . Nasal septum surgery    . Dilation and curettage of uterus    . Tubal ligation    . Cesarean section    . Sinus surgeries      x 4  . Left ovary and tube removed    . Vocal polyps removed    . Appendectomy    . Cystocele repair    . Carpal tunnel release      right  . Shoulder arthroscopy      x2 left, 1 right  . Left finger fusion      3 fingers on left hand/one finger right  . Right  median nerve decompression    . Duptyren's contracture right hand    . Finger ganglion cyst excision      right  . Abdominal hysterectomy    . Joint replacement      right and left basal joints of thumbs  . Bladder suspension    . Cataract extraction      bilateral  . Cervical neck ablation      x 7, C3-C6/3 screws and plate  . Squamous lesions removed      neck and face  . Basal cell carcinoma excision      face  . Panniculectomy    . Nasal polyp surgery      4 sinus surgeries  . Anterior fusion clivus-c2 extraoral w/ odontoid excision  8/14  . Proximal interphalangeal fusion (pip) Left 09/09/2013    Procedure: FUSION LEFT INDEX PROXIMAL INTERPHALANGEAL JOINT (PIP);  Surgeon: Cammie Sickle., MD;  Location: [facility];  Service: Orthopedics;  Laterality: Left;  . Breast ductal system excision Right 01/15/2014    Procedure: EXCISION DUCTAL SYSTEM RIGHT  BREAST;  Surgeon: Stark Klein, MD;  Location: [facility];  Service: General;  Laterality: Right;    History   Social History  . Marital Status: Married    Spouse Name: N/A    Number of Children: 3  . Years of Education: N/A   Occupational History  . retired, Licensed conveyancer    Social History Main Topics  . Smoking status: Former Smoker -- 0.50 packs/day for 3 years    Types: Cigarettes    Quit date: 05/29/1978  . Smokeless tobacco: Never Used     Comment: quit smoking 35 years ago  . Alcohol Use: No  . Drug Use: No  . Sexual Activity: Yes   Other Topics Concern  . Not on file   Social History Narrative   Pt lives at home with her spouse.   Moved to [place] ~ 2004        Medication List       This list is accurate as of: 04/14/14  5:43 PM.  Always use your most recent med list.               amLODipine 10 MG tablet  Commonly known as:  NORVASC  Take 1 tablet (10 mg total) by mouth every morning.     amphetamine-dextroamphetamine 10 MG tablet  Commonly known as:  ADDERALL    Take 1 tablet (10 mg total) by mouth 4 (four) times daily. May     arformoterol 15 MCG/2ML Nebu  Commonly known as:  BROVANA  Take 2 mLs (15 mcg total) by nebulization 2 (two) times daily.     atorvastatin 40 MG tablet  Commonly known as:  LIPITOR  Take 1 tablet (40 mg total) by mouth daily.     budesonide 0.25 MG/2ML nebulizer solution  Commonly known as:  PULMICORT  Take 2 mLs (0.25 mg total) by nebulization 2 (two) times daily.     CALCIUM-VITAMIN D PO  Take 1 tablet by mouth daily.     carbamazepine 100 MG 12 hr tablet  Commonly known as:  TEGRETOL XR  Take 200 mg by mouth 2 (two) times daily.     denosumab 60 MG/ML Soln injection  Commonly known as:  PROLIA  Inject 60 mg into the skin every 6 (six) months. Administer in upper arm, thigh, or abdomen     dexlansoprazole 60 MG capsule  Commonly known as:  DEXILANT  Take 60 mg by mouth. Take one pill twice a week     levothyroxine 25 MCG tablet  Commonly known as:  SYNTHROID, LEVOTHROID  TAKE 1&1/2 TABLETS DAILY     lidocaine 5 %  Commonly known as:  LIDODERM  Place 2 patches onto the skin as needed. Marland Kitchen Applies to back and neck.     mometasone 0.1 % cream  Commonly known as:  ELOCON  Apply 1 application topically daily.     mometasone 50 MCG/ACT nasal spray  Commonly known as:  NASONEX  PLACE 2 SPRAYS INTO THE NOSE DAILY     montelukast 10 MG tablet  Commonly known as:  SINGULAIR  Take 1 tablet (10 mg total) by mouth at bedtime.     ondansetron 4 MG disintegrating tablet  Commonly known as:  ZOFRAN-ODT  Take 4 mg by mouth daily.     PHENobarbital 32.4 MG tablet  Commonly known as:  LUMINAL  Take 1 tablet (32.4 mg total) by mouth 4 (four) times daily. one tablet at 6 am, one tablet  at noon,  two tabletss at 8 pm.     polyethylene glycol packet  Commonly known as:  MIRALAX / GLYCOLAX  Take 17 g by mouth at bedtime.     predniSONE 20 MG tablet  Commonly known as:  DELTASONE  Take 20 mg by mouth daily.      PROAIR HFA 108 (90 BASE) MCG/ACT inhaler  Generic drug:  albuterol  Inhale 1 puff into the lungs as needed.     VITAMIN C PO  Take 2 capsules by mouth daily.     Vitamin D (Ergocalciferol) 50000 UNITS Caps capsule  Commonly known as:  DRISDOL  TAKE 1 CAPSULE EVERY WEEK     VOLTAREN 1 % Gel  Generic drug:  diclofenac sodium  Apply 1 application topically 4 (four) times daily as needed. Applies to fingers and hands for gout.     zoster vaccine live (PF) 19400 UNT/0.65ML injection  Commonly known as:  ZOSTAVAX  Inject 19,400 Units into the skin once.           Objective:   Physical Exam BP 133/76 mmHg  Pulse 89  Temp(Src) 97.6 F (36.4 C) (Oral)  Wt 129 lb 8 oz (58.741 kg)  SpO2 99%  General -- alert, well-developed, NAD.  Lungs -- normal respiratory effort, no intercostal retractions, no accessory muscle use, and decreased  breath sounds.  Heart-- normal rate, regular rhythm, no murmur.  Extremities-- no pretibial edema bilaterally  Neurologic--  alert & oriented X3.   Psych-- Cognition and judgment appear intact. Cooperative with normal attention span and concentration. No anxious or depressed appearing.     Assessment & Plan:

## 2014-04-14 NOTE — Assessment & Plan Note (Signed)
Sees gynecology, recently diagnosed with a UTI, pessary was removed

## 2014-04-15 ENCOUNTER — Encounter: Payer: Self-pay | Admitting: Internal Medicine

## 2014-04-15 ENCOUNTER — Other Ambulatory Visit: Payer: Self-pay

## 2014-04-15 ENCOUNTER — Telehealth: Payer: Self-pay | Admitting: Internal Medicine

## 2014-04-15 MED ORDER — LEVOTHYROXINE SODIUM 25 MCG PO TABS: ORAL_TABLET | ORAL | Status: DC

## 2014-04-15 NOTE — Telephone Encounter (Signed)
I have electronically sent pt's info for Prolia insurance verification and will notify you once I have a response. Thank you. °

## 2014-04-16 ENCOUNTER — Other Ambulatory Visit: Payer: Self-pay | Admitting: Internal Medicine

## 2014-04-16 ENCOUNTER — Encounter: Payer: Self-pay | Admitting: Internal Medicine

## 2014-04-16 ENCOUNTER — Telehealth: Payer: Self-pay | Admitting: Internal Medicine

## 2014-04-16 DIAGNOSIS — Z79899 Other long term (current) drug therapy: Secondary | ICD-10-CM | POA: Diagnosis not present

## 2014-04-16 NOTE — Telephone Encounter (Signed)
emmi emailed °

## 2014-04-17 MED ORDER — DEXLANSOPRAZOLE 60 MG PO CPDR: 60.0000 mg | DELAYED_RELEASE_CAPSULE | Freq: Every day | ORAL | Status: DC

## 2014-04-20 ENCOUNTER — Other Ambulatory Visit: Payer: Self-pay

## 2014-04-22 ENCOUNTER — Telehealth: Payer: Self-pay

## 2014-04-22 NOTE — Telephone Encounter (Signed)
UDS: 04/16/2014   Positive for Adderall Positive for Phenobarbital  Low risk per Dr. Larose Kells 04/21/2014

## 2014-04-29 NOTE — Telephone Encounter (Signed)
Date error: Next due 06/15/2014.

## 2014-04-29 NOTE — Telephone Encounter (Signed)
Pt had Prolia 12/12/2013, she will be due for next Prolia 06/15/2013.

## 2014-04-29 NOTE — Telephone Encounter (Signed)
I have rec'd pt's insurance verification for Prolia.  [name] will have an estimated responsibility of $0 plus any applicable deductible. Please make her aware this is an estimate and we won't know an exact amt until both of her insurances have paid. I have sent a copy of the summary of benefits to be scanned into pt's chart. Please let me know if you have any questions. Thank you.

## 2014-05-05 NOTE — Telephone Encounter (Signed)
Please call Pt, inform her that she may schedule Nurse visit to receive Prolia injection any time after June 14, 2014.

## 2014-05-05 NOTE — Telephone Encounter (Signed)
Left detailed message informing patient of this and to call our office to schedule nurse visit anytime after 06/14/14

## 2014-05-11 DIAGNOSIS — M79641 Pain in right hand: Secondary | ICD-10-CM | POA: Diagnosis not present

## 2014-05-11 DIAGNOSIS — M79642 Pain in left hand: Secondary | ICD-10-CM | POA: Diagnosis not present

## 2014-05-11 DIAGNOSIS — M19041 Primary osteoarthritis, right hand: Secondary | ICD-10-CM | POA: Diagnosis not present

## 2014-05-11 DIAGNOSIS — M19042 Primary osteoarthritis, left hand: Secondary | ICD-10-CM | POA: Diagnosis not present

## 2014-05-11 DIAGNOSIS — M25532 Pain in left wrist: Secondary | ICD-10-CM | POA: Diagnosis not present

## 2014-05-11 DIAGNOSIS — M720 Palmar fascial fibromatosis [Dupuytren]: Secondary | ICD-10-CM | POA: Diagnosis not present

## 2014-05-11 DIAGNOSIS — G894 Chronic pain syndrome: Secondary | ICD-10-CM | POA: Diagnosis not present

## 2014-05-12 ENCOUNTER — Encounter: Payer: Self-pay | Admitting: Internal Medicine

## 2014-05-13 ENCOUNTER — Telehealth: Payer: Self-pay

## 2014-05-13 ENCOUNTER — Encounter: Payer: Self-pay | Admitting: Internal Medicine

## 2014-05-13 DIAGNOSIS — G6181 Chronic inflammatory demyelinating polyneuritis: Secondary | ICD-10-CM

## 2014-05-13 MED ORDER — AMPHETAMINE-DEXTROAMPHETAMINE 10 MG PO TABS: 10.0000 mg | ORAL_TABLET | Freq: Four times a day (QID) | ORAL | Status: DC

## 2014-05-13 NOTE — Telephone Encounter (Signed)
Pt informed via MyChart that Adderall rx has been placed at front desk for pick up as requested.

## 2014-05-13 NOTE — Telephone Encounter (Signed)
Pt is requesting refill on Adderall.  Last OV: 04/14/2014 Last Fill: 03/12/2014 # 120 0RF UDS: 04/16/2014 Low risk  Please advise.

## 2014-05-13 NOTE — Telephone Encounter (Signed)
done

## 2014-05-14 ENCOUNTER — Encounter: Payer: Self-pay | Admitting: Internal Medicine

## 2014-05-19 DIAGNOSIS — L570 Actinic keratosis: Secondary | ICD-10-CM | POA: Diagnosis not present

## 2014-05-19 DIAGNOSIS — L0212 Furuncle of neck: Secondary | ICD-10-CM | POA: Diagnosis not present

## 2014-05-19 DIAGNOSIS — D485 Neoplasm of uncertain behavior of skin: Secondary | ICD-10-CM | POA: Diagnosis not present

## 2014-06-05 ENCOUNTER — Encounter: Payer: Self-pay | Admitting: Internal Medicine

## 2014-06-05 ENCOUNTER — Ambulatory Visit (INDEPENDENT_AMBULATORY_CARE_PROVIDER_SITE_OTHER): Payer: Medicare Other | Admitting: Internal Medicine

## 2014-06-05 VITALS — BP 152/78 | HR 83 | Temp 98.1°F | Wt 130.0 lb

## 2014-06-05 DIAGNOSIS — L03115 Cellulitis of right lower limb: Secondary | ICD-10-CM | POA: Diagnosis not present

## 2014-06-05 MED ORDER — MUPIROCIN CALCIUM 2 % EX CREA: 1.0000 "application " | TOPICAL_CREAM | Freq: Two times a day (BID) | CUTANEOUS | Status: DC

## 2014-06-05 MED ORDER — DOXYCYCLINE HYCLATE 100 MG PO TABS: 100.0000 mg | ORAL_TABLET | Freq: Two times a day (BID) | ORAL | Status: DC

## 2014-06-05 NOTE — Progress Notes (Signed)
Subjective:    Patient ID: [name], female    DOB: 03/21/1933, 81 y.o.   MRN: [id number]  DOS:  06/05/2014 Type of visit - description : Acute, here with her husband Interval history: 2 weeks ago was visiting her family in [place] and somehow scratched her right shin, does not remember how. Since then the area has gotten red, the redness not increase in the last 2 days.   ROS Denies fever, chills, discharge. + Pain. Some swelling distal from the cellulitis around the ankle  Past Medical History  Diagnosis Date  . Anemia   . Asthma -COPD   . Chronic fatigue syndrome   . GERD (gastroesophageal reflux disease)   . Hyperlipidemia   . Hypertension   . Colon polyp   . Allergic rhinitis   . Vitamin D deficiency   . Pulmonary embolism     1980s  . Neuropathy   . Arthritis   . 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 Blood transfusion 1957  . Osteoporosis   . Hypothyroid   . Constipation   . Seizures     1990 last seizures on meds Phenobarb  . Skin cancer     squamous cell  . Neurogenic bladder 2013    husband caths pt  . chronic sinusitis 08/08/2012  . Shingles late 38's  . Cystocele 09/16/2012  . Tubular adenoma   . Breast mass     right breast in milk duct, bx neg    Past Surgical History  Procedure Laterality Date  . Tonsillectomy and adenoidectomy    . Nasal septum surgery    . Dilation and curettage of uterus    . Tubal ligation    . Cesarean section    . Sinus surgeries      x 4  . Left ovary and tube removed    . Vocal polyps removed    . Appendectomy    . Cystocele repair    . Carpal tunnel release      right  . Shoulder arthroscopy      x2 left, 1 right  . Left finger fusion      3 fingers on left hand/one finger right  . Right median nerve decompression    . Duptyren's contracture right hand    . Finger ganglion cyst excision      right  . Abdominal hysterectomy    . Joint replacement      right and left basal  joints of thumbs  . Bladder suspension    . Cataract extraction      bilateral  . Cervical neck ablation      x 7, C3-C6/3 screws and plate  . Squamous lesions removed      neck and face  . Basal cell carcinoma excision      face  . Panniculectomy    . Nasal polyp surgery      4 sinus surgeries  . Anterior fusion clivus-c2 extraoral w/ odontoid excision  8/14  . Proximal interphalangeal fusion (pip) Left 09/09/2013    Procedure: FUSION LEFT INDEX PROXIMAL INTERPHALANGEAL JOINT (PIP);  Surgeon: Cammie Sickle., MD;  Location: [facility];  Service: Orthopedics;  Laterality: Left;  . Breast ductal system excision Right 01/15/2014    Procedure: EXCISION DUCTAL SYSTEM RIGHT BREAST;  Surgeon: Stark Klein, MD;  Location: [facility];  Service: General;  Laterality: Right;    History   Social History  . Marital Status: Married  Spouse Name: N/A    Number of Children: 3  . Years of Education: N/A   Occupational History  . retired, Licensed conveyancer    Social History Main Topics  . Smoking status: Former Smoker -- 0.50 packs/day for 3 years    Types: Cigarettes    Quit date: 05/29/1978  . Smokeless tobacco: Never Used     Comment: quit smoking 35 years ago  . Alcohol Use: No  . Drug Use: No  . Sexual Activity: Yes   Other Topics Concern  . Not on file   Social History Narrative   Pt lives at home with her spouse.   Moved to [place] ~ 2004        Medication List       This list is accurate as of: 06/05/14  6:33 PM.  Always use your most recent med list.               amLODipine 10 MG tablet  Commonly known as:  NORVASC  Take 1 tablet (10 mg total) by mouth every morning.     amphetamine-dextroamphetamine 10 MG tablet  Commonly known as:  ADDERALL  Take 1 tablet (10 mg total) by mouth 4 (four) times daily. May     arformoterol 15 MCG/2ML Nebu  Commonly known as:  BROVANA  Take 2 mLs (15 mcg total) by nebulization 2 (two) times daily.       atorvastatin 40 MG tablet  Commonly known as:  LIPITOR  Take 1 tablet (40 mg total) by mouth daily.     budesonide 0.25 MG/2ML nebulizer solution  Commonly known as:  PULMICORT  Take 2 mLs (0.25 mg total) by nebulization 2 (two) times daily.     CALCIUM-VITAMIN D PO  Take 1 tablet by mouth daily.     carbamazepine 100 MG 12 hr tablet  Commonly known as:  TEGRETOL XR  Take 200 mg by mouth 2 (two) times daily.     denosumab 60 MG/ML Soln injection  Commonly known as:  PROLIA  Inject 60 mg into the skin every 6 (six) months. Administer in upper arm, thigh, or abdomen     dexlansoprazole 60 MG capsule  Commonly known as:  DEXILANT  Take 1 capsule (60 mg total) by mouth daily. SHOULD TAKE DAILY, NOT PRN!     doxycycline 100 MG tablet  Commonly known as:  VIBRA-TABS  Take 1 tablet (100 mg total) by mouth 2 (two) times daily.     levothyroxine 25 MCG tablet  Commonly known as:  SYNTHROID, LEVOTHROID  TAKE 1&1/2 TABLETS DAILY     lidocaine 5 %  Commonly known as:  LIDODERM  Place 2 patches onto the skin as needed. Marland Kitchen Applies to back and neck.     mometasone 0.1 % cream  Commonly known as:  ELOCON  Apply 1 application topically daily.     mometasone 50 MCG/ACT nasal spray  Commonly known as:  NASONEX  PLACE 2 SPRAYS INTO THE NOSE DAILY     montelukast 10 MG tablet  Commonly known as:  SINGULAIR  Take 1 tablet (10 mg total) by mouth at bedtime.     mupirocin cream 2 %  Commonly known as:  BACTROBAN  Apply 1 application topically 2 (two) times daily.     ondansetron 4 MG disintegrating tablet  Commonly known as:  ZOFRAN-ODT  Take 4 mg by mouth daily.     PHENobarbital 32.4 MG tablet  Commonly known as:  LUMINAL  Take 1 tablet (32.4 mg total) by mouth 4 (four) times daily. one tablet at 6 am, one tablet at noon,  two tabletss at 8 pm.     polyethylene glycol packet  Commonly known as:  MIRALAX / GLYCOLAX  Take 17 g by mouth at bedtime.     predniSONE 20 MG  tablet  Commonly known as:  DELTASONE  Take 20 mg by mouth daily.     PROAIR HFA 108 (90 BASE) MCG/ACT inhaler  Generic drug:  albuterol  Inhale 1 puff into the lungs as needed.     VITAMIN C PO  Take 2 capsules by mouth daily.     Vitamin D (Ergocalciferol) 50000 UNITS Caps capsule  Commonly known as:  DRISDOL  TAKE 1 CAPSULE EVERY WEEK     VOLTAREN 1 % Gel  Generic drug:  diclofenac sodium  Apply 1 application topically 4 (four) times daily as needed. Applies to fingers and hands for gout.           Objective:   Physical Exam  Constitutional: She appears well-developed and well-nourished. No distress.  Musculoskeletal:       Legs: Skin: She is not diaphoretic.  Psychiatric: She has a normal mood and affect. Her behavior is normal. Judgment and thought content normal.   BP 152/78 mmHg  Pulse 83  Temp(Src) 98.1 F (36.7 C) (Oral)  Wt 130 lb (58.968 kg)  SpO2 97%        Assessment & Plan:  Cellulitis, Last Td  2012, will prescribe doxycycline, watch for side effects such as diarrhea. Mupirocin. See instructions

## 2014-06-05 NOTE — Patient Instructions (Signed)
Keep the area clean and dry and cooperative with the antibiotic Doxycycline for 1 week  Elevated the leg at least 1 hour 2 times a day  Call anytime if you have fever, chills, increased swelling, the redness keeps spreading. Call if not improving gradually in the next 10 days   Cellulitis Cellulitis is an infection of the skin and the tissue beneath it. The infected area is usually red and tender. Cellulitis occurs most often in the arms and lower legs.  CAUSES  Cellulitis is caused by bacteria that enter the skin through cracks or cuts in the skin. The most common types of bacteria that cause cellulitis are staphylococci and streptococci. SIGNS AND SYMPTOMS   Redness and warmth.  Swelling.  Tenderness or pain.  Fever. DIAGNOSIS  Your health care provider can usually determine what is wrong based on a physical exam. Blood tests may also be done. TREATMENT  Treatment usually involves taking an antibiotic medicine. HOME CARE INSTRUCTIONS   Take your antibiotic medicine as directed by your health care provider. Finish the antibiotic even if you start to feel better.  Keep the infected arm or leg elevated to reduce swelling.  Apply a warm cloth to the affected area up to 4 times per day to relieve pain.  Take medicines only as directed by your health care provider.  Keep all follow-up visits as directed by your health care provider. SEEK MEDICAL CARE IF:   You notice red streaks coming from the infected area.  Your red area gets larger or turns dark in color.  Your bone or joint underneath the infected area becomes painful after the skin has healed.  Your infection returns in the same area or another area.  You notice a swollen bump in the infected area.  You develop new symptoms.  You have a fever. SEEK IMMEDIATE MEDICAL CARE IF:   You feel very sleepy.  You develop vomiting or diarrhea.  You have a general ill feeling (malaise) with muscle aches and  pains. MAKE SURE YOU:   Understand these instructions.  Will watch your condition.  Will get help right away if you are not doing well or get worse. Document Released: 02/22/2005 Document Revised: 09/29/2013 Document Reviewed: 07/31/2011 [facility] Patient Information 2015 [place], [place]. This information is not intended to replace advice given to you by your health care provider. Make sure you discuss any questions you have with your health care provider.

## 2014-06-05 NOTE — Progress Notes (Signed)
Pre visit review using our clinic review tool, if applicable. No additional management support is needed unless otherwise documented below in the visit note. 

## 2014-06-10 ENCOUNTER — Other Ambulatory Visit: Payer: Self-pay | Admitting: *Deleted

## 2014-06-10 MED ORDER — ONDANSETRON 4 MG PO TBDP: 4.0000 mg | ORAL_TABLET | Freq: Every day | ORAL | Status: DC

## 2014-06-11 DIAGNOSIS — M4802 Spinal stenosis, cervical region: Secondary | ICD-10-CM | POA: Diagnosis not present

## 2014-06-11 DIAGNOSIS — R252 Cramp and spasm: Secondary | ICD-10-CM | POA: Diagnosis not present

## 2014-06-15 ENCOUNTER — Other Ambulatory Visit: Payer: Self-pay | Admitting: Internal Medicine

## 2014-06-21 ENCOUNTER — Encounter: Payer: Self-pay | Admitting: Internal Medicine

## 2014-06-22 DIAGNOSIS — M79642 Pain in left hand: Secondary | ICD-10-CM | POA: Diagnosis not present

## 2014-06-22 DIAGNOSIS — M79641 Pain in right hand: Secondary | ICD-10-CM | POA: Diagnosis not present

## 2014-06-22 DIAGNOSIS — M25532 Pain in left wrist: Secondary | ICD-10-CM | POA: Diagnosis not present

## 2014-06-22 DIAGNOSIS — M05731 Rheumatoid arthritis with rheumatoid factor of right wrist without organ or systems involvement: Secondary | ICD-10-CM | POA: Diagnosis not present

## 2014-06-23 ENCOUNTER — Encounter: Payer: Self-pay | Admitting: *Deleted

## 2014-06-23 NOTE — Telephone Encounter (Signed)
Message to Dr Larose Kells

## 2014-07-02 DIAGNOSIS — R0602 Shortness of breath: Secondary | ICD-10-CM | POA: Diagnosis not present

## 2014-07-02 DIAGNOSIS — J45909 Unspecified asthma, uncomplicated: Secondary | ICD-10-CM | POA: Diagnosis not present

## 2014-07-10 ENCOUNTER — Other Ambulatory Visit: Payer: Self-pay | Admitting: Internal Medicine

## 2014-07-10 DIAGNOSIS — M810 Age-related osteoporosis without current pathological fracture: Secondary | ICD-10-CM | POA: Diagnosis not present

## 2014-07-10 DIAGNOSIS — J449 Chronic obstructive pulmonary disease, unspecified: Secondary | ICD-10-CM | POA: Diagnosis not present

## 2014-07-13 ENCOUNTER — Other Ambulatory Visit: Payer: Self-pay | Admitting: Internal Medicine

## 2014-07-13 ENCOUNTER — Encounter: Payer: Self-pay | Admitting: Internal Medicine

## 2014-07-14 ENCOUNTER — Other Ambulatory Visit: Payer: Self-pay | Admitting: Internal Medicine

## 2014-07-14 DIAGNOSIS — G6181 Chronic inflammatory demyelinating polyneuritis: Secondary | ICD-10-CM

## 2014-07-14 MED ORDER — AMPHETAMINE-DEXTROAMPHETAMINE 10 MG PO TABS: 10.0000 mg | ORAL_TABLET | Freq: Four times a day (QID) | ORAL | Status: DC

## 2014-07-15 DIAGNOSIS — R8290 Unspecified abnormal findings in urine: Secondary | ICD-10-CM | POA: Diagnosis not present

## 2014-07-15 DIAGNOSIS — N8189 Other female genital prolapse: Secondary | ICD-10-CM | POA: Diagnosis not present

## 2014-07-20 ENCOUNTER — Encounter: Payer: Medicare Other | Admitting: Internal Medicine

## 2014-07-28 ENCOUNTER — Other Ambulatory Visit: Payer: Self-pay | Admitting: Dermatology

## 2014-07-28 DIAGNOSIS — L821 Other seborrheic keratosis: Secondary | ICD-10-CM | POA: Diagnosis not present

## 2014-07-28 DIAGNOSIS — L280 Lichen simplex chronicus: Secondary | ICD-10-CM | POA: Diagnosis not present

## 2014-08-03 DIAGNOSIS — M79641 Pain in right hand: Secondary | ICD-10-CM | POA: Diagnosis not present

## 2014-08-03 DIAGNOSIS — M79642 Pain in left hand: Secondary | ICD-10-CM | POA: Diagnosis not present

## 2014-08-03 DIAGNOSIS — M19042 Primary osteoarthritis, left hand: Secondary | ICD-10-CM | POA: Diagnosis not present

## 2014-08-03 DIAGNOSIS — M19041 Primary osteoarthritis, right hand: Secondary | ICD-10-CM | POA: Diagnosis not present

## 2014-08-05 ENCOUNTER — Encounter: Payer: Self-pay | Admitting: *Deleted

## 2014-08-05 ENCOUNTER — Telehealth: Payer: Self-pay | Admitting: *Deleted

## 2014-08-05 NOTE — Telephone Encounter (Signed)
Pre-Visit Call completed with patient and chart updated.   Pre-Visit Info documented in Specialty Comments under [employer].    

## 2014-08-07 ENCOUNTER — Encounter: Payer: Self-pay | Admitting: Internal Medicine

## 2014-08-07 ENCOUNTER — Ambulatory Visit (INDEPENDENT_AMBULATORY_CARE_PROVIDER_SITE_OTHER): Payer: Medicare Other | Admitting: Internal Medicine

## 2014-08-07 ENCOUNTER — Other Ambulatory Visit: Payer: Self-pay

## 2014-08-07 VITALS — BP 128/78 | HR 86 | Temp 98.3°F | Ht 62.0 in | Wt 128.4 lb

## 2014-08-07 DIAGNOSIS — M810 Age-related osteoporosis without current pathological fracture: Secondary | ICD-10-CM

## 2014-08-07 DIAGNOSIS — C4490 Unspecified malignant neoplasm of skin, unspecified: Secondary | ICD-10-CM

## 2014-08-07 DIAGNOSIS — E785 Hyperlipidemia, unspecified: Secondary | ICD-10-CM

## 2014-08-07 DIAGNOSIS — L659 Nonscarring hair loss, unspecified: Secondary | ICD-10-CM | POA: Diagnosis not present

## 2014-08-07 DIAGNOSIS — G4710 Hypersomnia, unspecified: Secondary | ICD-10-CM

## 2014-08-07 DIAGNOSIS — F329 Major depressive disorder, single episode, unspecified: Secondary | ICD-10-CM

## 2014-08-07 DIAGNOSIS — Z0000 Encounter for general adult medical examination without abnormal findings: Secondary | ICD-10-CM | POA: Diagnosis not present

## 2014-08-07 DIAGNOSIS — F32A Depression, unspecified: Secondary | ICD-10-CM

## 2014-08-07 DIAGNOSIS — E559 Vitamin D deficiency, unspecified: Secondary | ICD-10-CM | POA: Diagnosis not present

## 2014-08-07 DIAGNOSIS — R5382 Chronic fatigue, unspecified: Secondary | ICD-10-CM

## 2014-08-07 LAB — LIPID PANEL
Cholesterol: 236 mg/dL — ABNORMAL HIGH (ref 0–200)
HDL: 84.7 mg/dL (ref >39.00)
LDL Cholesterol: 133 mg/dL — ABNORMAL HIGH (ref 0–99)
NonHDL: 151.3
Total CHOL/HDL Ratio: 3
Triglycerides: 92 mg/dL (ref 0.0–149.0)
VLDL: 18.4 mg/dL (ref 0.0–40.0)

## 2014-08-07 NOTE — Progress Notes (Signed)
Pre visit review using our clinic review tool, if applicable. No additional management support is needed unless otherwise documented below in the visit note. 

## 2014-08-07 NOTE — Assessment & Plan Note (Signed)
At baseline 

## 2014-08-07 NOTE — Assessment & Plan Note (Signed)
Refill Adderall as needed, last UDS low risk, sign a contract

## 2014-08-07 NOTE — Assessment & Plan Note (Signed)
On no medications, currently not an issue

## 2014-08-07 NOTE — Assessment & Plan Note (Addendum)
Will check FLP. On Lipitor, no change. Last LFTs normal

## 2014-08-07 NOTE — Progress Notes (Signed)
Subjective:    Patient ID: [name], female    DOB: 07/10/1932, 81 y.o.   MRN: [id number]  DOS:  08/07/2014 Type of visit - description :  Here for Medicare AWV: 1. Risk factors based on Past M, S, F history: reviewed 2. Physical Activities: [employer] x 2/week 3. Depression/mood: neg screening  4. Hearing:  No problems noted or reported  5. ADL's: needs help (husband) to take showers, able to self dress except for shoes, does not drive (hard to turn her neck)  6. Fall Risk: + recent falls, prevention discussed  7. home Safety: does feel safe at home  8. Height, weight, & visual acuity: see VS, sees eye doctor regulalrly 9. Counseling: provided 10. Labs ordered based on risk factors: if needed  11. Referral Coordination: if needed 12. Care Plan, see assessment and plan , written personalized plan provided  13. Cognitive Assessment: motor skills decreased d/t to DJD and cognition appropriate for age 35. Care team updated 15. End-of-life care discussed w/ pt, she already had the conversation w/ family  In addition, today we discussed the following:  History of low vitamin D, request a refill Hypothyroidism, good compliance with Synthroid. COPD, good compliance with all medications including prednisone. Neuropathy, seizure disorder, good compliance with meds, follow up at [facility]. Attention, ambulatory BPs within normal Osteoporosis, due for Prolia  Review of Systems  Constitutional: No fever, ? Chills ( "feel cold all the time  "). No unexplained wt changes. No unusual sweats. Chronic fatigue at baseline HEENT: No dental problems, ear discharge, facial swelling, voice changes. No eye discharge, redness or intolerance to light Respiratory: No wheezing or difficulty breathing. No cough , mucus production Cardiovascular: No CP, leg swelling or palpitations GI: Chronic GI symptoms at baseline including constipation and fecal incontinence Endocrine: No polyphagia, polyuria or  polydipsia GU: Chronic GU symptoms including bladder incontinence at baseline Musculoskeletal: No joint swellings or unusual aches or pains Skin: No change in the color of the skin, palor or rash; for the last few months states that she has been losing her hair in a global fashion Allergic, immunologic: No environmental allergies or food allergies Neurological: No dizziness or syncope. No headaches. No diplopia, slurred speech, motor deficits, facial numbness Hematological: No enlarged lymph nodes, easy bruising or bleeding Psychiatry: No suicidal ideas, hallucinations, behavior problems or confusion. No unusual/severe anxiety or depression.     Past Medical History  Diagnosis Date  . Anemia   . Asthma -COPD   . Chronic fatigue syndrome   . GERD (gastroesophageal reflux disease)   . Hyperlipidemia   . Hypertension   . Colon polyp   . Allergic rhinitis   . Vitamin D deficiency   . Pulmonary embolism     1980s  . Arthritis   . 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 Blood transfusion 1957  . Osteoporosis   . Hypothyroid   . Constipation   . Seizures     1990 last seizures on meds Phenobarb  . Skin cancer     squamous cell  . Neurogenic bladder 2013    husband caths pt  . chronic sinusitis 08/08/2012  . Shingles late 39's  . Cystocele 09/16/2012  . Tubular adenoma   . Breast mass     right breast in milk duct, bx neg    Past Surgical History  Procedure Laterality Date  . Tonsillectomy and adenoidectomy    . Nasal septum surgery    . Dilation and curettage  of uterus    . Tubal ligation    . Cesarean section    . Sinus surgeries      x 4  . Left ovary and tube removed    . Vocal polyps removed    . Appendectomy    . Cystocele repair    . Carpal tunnel release      right  . Shoulder arthroscopy      x2 left, 1 right  . Left finger fusion      3 fingers on left hand/one finger right  . Right median nerve decompression    . Duptyren's  contracture right hand    . Finger ganglion cyst excision      right  . Abdominal hysterectomy    . Joint replacement      right and left basal joints of thumbs  . Bladder suspension    . Cataract extraction      bilateral  . Cervical neck ablation      x 7, C3-C6/3 screws and plate  . Squamous lesions removed      neck and face  . Basal cell carcinoma excision      face  . Panniculectomy    . Nasal polyp surgery      4 sinus surgeries  . Anterior fusion clivus-c2 extraoral w/ odontoid excision  8/14  . Proximal interphalangeal fusion (pip) Left 09/09/2013    Procedure: FUSION LEFT INDEX PROXIMAL INTERPHALANGEAL JOINT (PIP);  Surgeon: Cammie Sickle., MD;  Location: [facility];  Service: Orthopedics;  Laterality: Left;  . Breast ductal system excision Right 01/15/2014    Procedure: EXCISION DUCTAL SYSTEM RIGHT BREAST;  Surgeon: Stark Klein, MD;  Location: [facility];  Service: General;  Laterality: Right;    History   Social History  . Marital Status: Married    Spouse Name: N/A  . Number of Children: 3  . Years of Education: N/A   Occupational History  . retired, Licensed conveyancer    Social History Main Topics  . Smoking status: Former Smoker -- 0.50 packs/day for 3 years    Types: Cigarettes    Quit date: 05/29/1978  . Smokeless tobacco: Never Used     Comment: quit smoking 35 years ago  . Alcohol Use: No  . Drug Use: No  . Sexual Activity: Yes   Other Topics Concern  . Not on file   Social History Narrative   Pt lives at home with her spouse.   Moved to [place] ~ 2004        Family History  Problem Relation Age of Onset  . Heart disease Mother   . Hyperlipidemia Mother     Jerilynn Mages, aunt  . Ovarian cancer Sister   . Lung cancer Sister     lung - stage 1  . Thyroid cancer Sister   . Colon cancer Father     80  . Stomach cancer Other     first cousin  . Skin cancer Daughter   . Malignant hyperthermia Cousin   . Breast cancer  Neg Hx        Medication List       This list is accurate as of: 08/07/14 11:59 PM.  Always use your most recent med list.               amLODipine 10 MG tablet  Commonly known as:  NORVASC  Take 1 tablet (10 mg total) by mouth every morning.  amphetamine-dextroamphetamine 10 MG tablet  Commonly known as:  ADDERALL  Take 1 tablet (10 mg total) by mouth 4 (four) times daily. May     arformoterol 15 MCG/2ML Nebu  Commonly known as:  BROVANA  Take 2 mLs (15 mcg total) by nebulization 2 (two) times daily.     atorvastatin 40 MG tablet  Commonly known as:  LIPITOR  TAKE ONE (1) TABLET EACH DAY     budesonide 0.25 MG/2ML nebulizer solution  Commonly known as:  PULMICORT  Take 2 mLs (0.25 mg total) by nebulization 2 (two) times daily.     CALCIUM-VITAMIN D PO  Take 1 tablet by mouth daily.     carbamazepine 100 MG 12 hr tablet  Commonly known as:  TEGRETOL XR  Take 200 mg by mouth 2 (two) times daily.     denosumab 60 MG/ML Soln injection  Commonly known as:  PROLIA  Inject 60 mg into the skin every 6 (six) months. Administer in upper arm, thigh, or abdomen     dexlansoprazole 60 MG capsule  Commonly known as:  DEXILANT  Take 1 capsule (60 mg total) by mouth daily. SHOULD TAKE DAILY, NOT PRN!     levothyroxine 25 MCG tablet  Commonly known as:  SYNTHROID, LEVOTHROID  TAKE 1 & 1/2 TABLETS DAILY     mometasone 50 MCG/ACT nasal spray  Commonly known as:  NASONEX  PLACE 2 SPRAYS INTO THE NOSE DAILY     montelukast 10 MG tablet  Commonly known as:  SINGULAIR  Take 1 tablet (10 mg total) by mouth at bedtime.     ondansetron 4 MG disintegrating tablet  Commonly known as:  ZOFRAN-ODT  Take 1 tablet (4 mg total) by mouth daily.     PHENobarbital 32.4 MG tablet  Commonly known as:  LUMINAL  Take 1 tablet (32.4 mg total) by mouth 4 (four) times daily. one tablet at 6 am, one tablet at noon,  two tabletss at 8 pm.     polyethylene glycol packet  Commonly known as:   MIRALAX / GLYCOLAX  Take 17 g by mouth at bedtime.     predniSONE 20 MG tablet  Commonly known as:  DELTASONE  Take 20 mg by mouth daily.     PROAIR HFA 108 (90 BASE) MCG/ACT inhaler  Generic drug:  albuterol  Inhale 1 puff into the lungs as needed.     VITAMIN C PO  Take 2 capsules by mouth daily.     Vitamin D 2000 UNITS Caps  Take 1 capsule by mouth daily.     VOLTAREN 1 % Gel  Generic drug:  diclofenac sodium  Apply 1 application topically 4 (four) times daily as needed. Applies to fingers and hands for gout.           Objective:   Physical Exam BP 128/78 mmHg  Pulse 86  Temp(Src) 98.3 F (36.8 C) (Oral)  Ht 5\' 2"  (1.575 m)  Wt 128 lb 6 oz (58.231 kg)  BMI 23.47 kg/m2  SpO2 95% General:   Well developed, well nourished . Fragile elderly female in no distress.  Neck:  Range of motion decrease. No  thyromegaly , normal carotid pulse HEENT:  Normocephalic . Face symmetric, atraumatic Lungs:  decrease breath sounds Normal respiratory effort, no intercostal retractions, no accessory muscle use. Heart: RRR,  no murmur.  Abdomen:  Not distended, soft, non-tender. No rebound or rigidity. No mass,organomegaly. No bruit Muscle skeletal: no pretibial edema bilaterally ; changes consistent  with DJD throughout her joints Skin: Exposed areas without rash. Not pale. Not jaundice Neurologic:  alert & oriented X3.  Speech normal, gait limited by DJD but unassisted  Psych: Cognition and judgment appear intact.  Cooperative with normal attention span and concentration.  Behavior appropriate. No anxious or depressed appearing.        Assessment & Plan:   Loosing her hair, check RPR

## 2014-08-07 NOTE — Assessment & Plan Note (Signed)
Reports she sees Dr. Allyson Sabal regularly

## 2014-08-07 NOTE — Assessment & Plan Note (Signed)
On high doses of vitamin D, recommend to switch to OTC 2000 units daily, check levels today and on  return to the office in 6 months

## 2014-08-07 NOTE — Assessment & Plan Note (Signed)
We will schedule a DEXA  and a Prolia injection

## 2014-08-07 NOTE — Assessment & Plan Note (Addendum)
Immunization Status: Flu vaccine--03/16/14 Tdap--04/11/11 Prevnar --12/11/13 Had a pnm shot  Shingles--04/14/14   Female care  Used to see gyn (Meizinger) no recent PAP, after she was Rx a pessary was rec to f/u prn. H/o hysterectomy  MMG-- 01/12/14- BI-RADS 2: Benign (recommended f/u 1 year), s/p breast bx 12-2013 (-)  CCS--Cscope 01/12/14- with Zenovia Jarred, MD   negative

## 2014-08-07 NOTE — Patient Instructions (Signed)
Get your blood work before you leave . Needs contract  Come back to the office in 6 months  for a routine check up    [place] cause injuries and can affect all age groups. It is possible to use preventive measures to significantly decrease the likelihood of falls. There are many simple measures which can make your home safer and prevent falls. OUTDOORS  Repair cracks and edges of walkways and driveways.  Remove high doorway thresholds.  Trim shrubbery on the main path into your home.  Have good outside lighting.  Clear walkways of tools, rocks, debris, and clutter.  Check that handrails are not broken and are securely fastened. Both sides of steps should have handrails.  Have leaves, snow, and ice cleared regularly.  Use sand or salt on walkways during winter months.  In the garage, clean up grease or oil spills. BATHROOM  Install night lights.  Install grab bars by the toilet and in the tub and shower.  Use non-skid mats or decals in the tub or shower.  Place a plastic non-slip stool in the shower to sit on, if needed.  Keep floors dry and clean up all water on the floor immediately.  Remove soap buildup in the tub or shower on a regular basis.  Secure bath mats with non-slip, double-sided rug tape.  Remove throw rugs and tripping hazards from the floors. BEDROOMS  Install night lights.  Make sure a bedside light is easy to reach.  Do not use oversized bedding.  Keep a telephone by your bedside.  Have a firm chair with side arms to use for getting dressed.  Remove throw rugs and tripping hazards from the floor. KITCHEN  Keep handles on pots and pans turned toward the center of the stove. Use back burners when possible.  Clean up spills quickly and allow time for drying.  Avoid walking on wet floors.  Avoid hot utensils and knives.  Position shelves so they are not too high or low.  Place commonly used objects within easy  reach.  If necessary, use a sturdy step stool with a grab bar when reaching.  Keep electrical cables out of the way.  Do not use floor polish or wax that makes floors slippery. If you must use wax, use non-skid floor wax.  Remove throw rugs and tripping hazards from the floor. STAIRWAYS  Never leave objects on stairs.  Place handrails on both sides of stairways and use them. Fix any loose handrails. Make sure handrails on both sides of the stairways are as long as the stairs.  Check carpeting to make sure it is firmly attached along stairs. Make repairs to worn or loose carpet promptly.  Avoid placing throw rugs at the top or bottom of stairways, or properly secure the rug with carpet tape to prevent slippage. Get rid of throw rugs, if possible.  Have an electrician put in a light switch at the top and bottom of the stairs. OTHER FALL PREVENTION TIPS  Wear low-heel or rubber-soled shoes that are supportive and fit well. Wear closed toe shoes.  When using a stepladder, make sure it is fully opened and both spreaders are firmly locked. Do not climb a closed stepladder.  Add color or contrast paint or tape to grab bars and handrails in your home. Place contrasting color strips on first and last steps.  Learn and use mobility aids as needed. Install an electrical emergency response system.  Turn on lights to  avoid dark areas. Replace light bulbs that burn out immediately. Get light switches that glow.  Arrange furniture to create clear pathways. Keep furniture in the same place.  Firmly attach carpet with non-skid or double-sided tape.  Eliminate uneven floor surfaces.  Select a carpet pattern that does not visually hide the edge of steps.  Be aware of all pets. OTHER HOME SAFETY TIPS  Set the water temperature for 120 F (48.8 C).  Keep emergency numbers on or near the telephone.  Keep smoke detectors on every level of the home and near sleeping areas. Document Released:  05/05/2002 Document Revised: 11/14/2011 Document Reviewed: 08/04/2011 [facility] Patient Information 2015 [place], [place]. This information is not intended to replace advice given to you by your health care provider. Make sure you discuss any questions you have with your health care provider.   Preventive Care for Adults Ages 35 and over  Blood pressure check.** / Every 1 to 2 years.  Lipid and cholesterol check.**/ Every 5 years beginning at age 44.  Lung cancer screening. / Every year if you are aged 74-80 years and have a 30-pack-year history of smoking and currently smoke or have quit within the past 15 years. Yearly screening is stopped once you have quit smoking for at least 15 years or develop a health problem that would prevent you from having lung cancer treatment.  Fecal occult blood test (FOBT) of stool. / Every year beginning at age 29 and continuing until age 20. You may not have to do this test if you get a colonoscopy every 10 years.  Flexible sigmoidoscopy** or colonoscopy.** / Every 5 years for a flexible sigmoidoscopy or every 10 years for a colonoscopy beginning at age 36 and continuing until age 76.  Hepatitis C blood test.** / For all people born from [birth year] through 1963 and any individual with known risks for hepatitis C.  Abdominal aortic aneurysm (AAA) screening.** / A one-time screening for ages 46 to 88 years who are current or former smokers.  Skin self-exam. / Monthly.  Influenza vaccine. / Every year.  Tetanus, diphtheria, and acellular pertussis (Tdap/Td) vaccine.** / 1 dose of Td every 10 years.  Varicella vaccine.** / Consult your health care provider.  Zoster vaccine.** / 1 dose for adults aged 6 years or older.  Pneumococcal 13-valent conjugate (PCV13) vaccine.** / Consult your health care provider.  Pneumococcal polysaccharide (PPSV23) vaccine.** / 1 dose for all adults aged 69 years and older.  Meningococcal vaccine.** / Consult your health care  provider.  Hepatitis A vaccine.** / Consult your health care provider.  Hepatitis B vaccine.** / Consult your health care provider.  Haemophilus influenzae type b (Hib) vaccine.** / Consult your health care provider. **Family history and personal history of risk and conditions may change your health care provider's recommendations. Document Released: 07/11/2001 Document Revised: 05/20/2013 Document Reviewed: 10/10/2010 [employer] Patient Information 2015 [place], [place]. This information is not intended to replace advice given to you by your health care provider. Make sure you discuss any questions you have with your health care provider.

## 2014-08-08 LAB — RPR

## 2014-08-11 ENCOUNTER — Ambulatory Visit (INDEPENDENT_AMBULATORY_CARE_PROVIDER_SITE_OTHER)
Admission: RE | Admit: 2014-08-11 | Discharge: 2014-08-11 | Disposition: A | Discharge status: Home / Self Care | Payer: Medicare Other | Source: Ambulatory Visit | Attending: Internal Medicine | Admitting: Internal Medicine

## 2014-08-11 DIAGNOSIS — M810 Age-related osteoporosis without current pathological fracture: Secondary | ICD-10-CM | POA: Diagnosis not present

## 2014-08-13 LAB — VITAMIN D 1,25 DIHYDROXY
Vitamin D 1, 25 (OH)2 Total: 40 pg/mL (ref 18–72)
Vitamin D2 1, 25 (OH)2: 40 pg/mL
Vitamin D3 1, 25 (OH)2: <8 pg/mL

## 2014-08-17 ENCOUNTER — Telehealth: Payer: Self-pay | Admitting: Internal Medicine

## 2014-08-17 DIAGNOSIS — E039 Hypothyroidism, unspecified: Secondary | ICD-10-CM

## 2014-08-17 NOTE — Telephone Encounter (Signed)
Caller name: Zela, Sobieski Relation to pt: self  Call back number: [phone]   Reason for call:  Pt would like discuss email my chart conversation regarding prolia. Pt states she is upset she was just seen 08/07/14 and could have received prolia at the time of appointment. Pt asked for Wilfrid Lund, CMA to give her a call. Thank you

## 2014-08-17 NOTE — Telephone Encounter (Signed)
TSH ordered.

## 2014-08-17 NOTE — Telephone Encounter (Signed)
Okay to check a TSH, DX hypothyroidism

## 2014-08-17 NOTE — Telephone Encounter (Signed)
Routing error, please see below.

## 2014-08-17 NOTE — Telephone Encounter (Signed)
Spoke with Pt, informed her of steps for receiving Prolia, Pt verbalized understanding. Informed Pt I would call her to schedule appt when Prolia comes in. Pt requested to have TSH checked when she returns to the office for Prolia. Informed her I would ask Dr. Larose Kells and let her know. Pt verbalized understanding.

## 2014-08-18 NOTE — Telephone Encounter (Signed)
Pt has Prolia injection shot scheduled for 08/20/2014 at 2:30 PM and lab appt for TSH schedule 08/20/2014 at 2:45.

## 2014-08-19 ENCOUNTER — Encounter: Payer: Self-pay | Admitting: Internal Medicine

## 2014-08-20 ENCOUNTER — Ambulatory Visit (INDEPENDENT_AMBULATORY_CARE_PROVIDER_SITE_OTHER): Payer: Medicare Other | Admitting: *Deleted

## 2014-08-20 ENCOUNTER — Other Ambulatory Visit: Payer: Self-pay

## 2014-08-20 ENCOUNTER — Other Ambulatory Visit (INDEPENDENT_AMBULATORY_CARE_PROVIDER_SITE_OTHER): Payer: Medicare Other

## 2014-08-20 DIAGNOSIS — E039 Hypothyroidism, unspecified: Secondary | ICD-10-CM | POA: Diagnosis not present

## 2014-08-20 DIAGNOSIS — M810 Age-related osteoporosis without current pathological fracture: Secondary | ICD-10-CM

## 2014-08-20 MED ORDER — DENOSUMAB 60 MG/ML ~~LOC~~ SOLN
60.0000 mg | Freq: Once | SUBCUTANEOUS | Status: AC
  Administered 2014-08-20: 60 mg via SUBCUTANEOUS

## 2014-08-20 MED ORDER — PHENOBARBITAL 32.4 MG PO TABS
32.4000 mg | ORAL_TABLET | Freq: Four times a day (QID) | ORAL | Status: DC
Start: 2014-08-20 — End: 2015-02-23

## 2014-08-20 NOTE — Progress Notes (Signed)
Pre visit review using our clinic review tool, if applicable. No additional management support is needed unless otherwise documented below in the visit note.  Patient tolerated injection well.  Patient escorted to lab for TSH.

## 2014-08-21 LAB — TSH: TSH: 0.977 u[IU]/mL (ref 0.350–4.500)

## 2014-08-28 ENCOUNTER — Telehealth: Payer: Self-pay | Admitting: *Deleted

## 2014-08-28 NOTE — Telephone Encounter (Signed)
Prior authorization for amphetamine-dextroamphetamine 10 mg initiated through [facility]. Awaiting determination. JG//CMA

## 2014-09-01 ENCOUNTER — Encounter: Payer: Self-pay | Admitting: *Deleted

## 2014-09-01 NOTE — Telephone Encounter (Signed)
PA denied. Appeal started, letter of medical necessity mailed to [facility]. Pt aware. JG//CMA

## 2014-09-01 NOTE — Telephone Encounter (Signed)
Additional info submitted to [facility] via phone. JG//CMA

## 2014-09-11 DIAGNOSIS — R252 Cramp and spasm: Secondary | ICD-10-CM | POA: Diagnosis not present

## 2014-09-11 DIAGNOSIS — M47812 Spondylosis without myelopathy or radiculopathy, cervical region: Secondary | ICD-10-CM | POA: Diagnosis not present

## 2014-09-11 DIAGNOSIS — M5022 Other cervical disc displacement, mid-cervical region: Secondary | ICD-10-CM | POA: Diagnosis not present

## 2014-09-11 DIAGNOSIS — Z981 Arthrodesis status: Secondary | ICD-10-CM | POA: Diagnosis not present

## 2014-09-11 DIAGNOSIS — M5412 Radiculopathy, cervical region: Secondary | ICD-10-CM | POA: Diagnosis not present

## 2014-09-15 ENCOUNTER — Encounter: Payer: Self-pay | Admitting: Internal Medicine

## 2014-09-16 ENCOUNTER — Other Ambulatory Visit: Payer: Self-pay | Admitting: Internal Medicine

## 2014-09-16 ENCOUNTER — Encounter: Payer: Self-pay | Admitting: Internal Medicine

## 2014-09-16 DIAGNOSIS — G6181 Chronic inflammatory demyelinating polyneuritis: Secondary | ICD-10-CM

## 2014-09-16 MED ORDER — AMPHETAMINE-DEXTROAMPHETAMINE 10 MG PO TABS: 10.0000 mg | ORAL_TABLET | Freq: Four times a day (QID) | ORAL | Status: DC

## 2014-09-16 NOTE — Telephone Encounter (Signed)
Spoke with Pt, informed her that Rx is ready for pick up at front desk. Pt verbalized understanding.

## 2014-09-23 ENCOUNTER — Telehealth: Payer: Self-pay

## 2014-09-23 NOTE — Telephone Encounter (Signed)
Received Prolia Eligibility for Pt. Pt covered for Prolia: 0% out of pocket after $166 deductible. Eligibility sent for scanning into chart.

## 2014-09-30 DIAGNOSIS — M19232 Secondary osteoarthritis, left wrist: Secondary | ICD-10-CM | POA: Diagnosis not present

## 2014-09-30 DIAGNOSIS — M19041 Primary osteoarthritis, right hand: Secondary | ICD-10-CM | POA: Diagnosis not present

## 2014-09-30 DIAGNOSIS — M720 Palmar fascial fibromatosis [Dupuytren]: Secondary | ICD-10-CM | POA: Diagnosis not present

## 2014-09-30 DIAGNOSIS — J449 Chronic obstructive pulmonary disease, unspecified: Secondary | ICD-10-CM | POA: Diagnosis not present

## 2014-09-30 DIAGNOSIS — J45909 Unspecified asthma, uncomplicated: Secondary | ICD-10-CM | POA: Diagnosis not present

## 2014-10-01 NOTE — Telephone Encounter (Signed)
Caller name: Ivor Messier Call back number: [phone]   Reason for call:  Additional diagnosis and additional medication patient has tried and failed

## 2014-10-05 NOTE — Telephone Encounter (Signed)
Spoke with [place] PA rep and additional information was provided. PA is still processing. JG//CMA

## 2014-10-06 ENCOUNTER — Other Ambulatory Visit: Payer: Self-pay | Admitting: Internal Medicine

## 2014-10-07 DIAGNOSIS — M19041 Primary osteoarthritis, right hand: Secondary | ICD-10-CM | POA: Diagnosis not present

## 2014-10-07 DIAGNOSIS — M720 Palmar fascial fibromatosis [Dupuytren]: Secondary | ICD-10-CM | POA: Diagnosis not present

## 2014-10-08 DIAGNOSIS — M4802 Spinal stenosis, cervical region: Secondary | ICD-10-CM | POA: Diagnosis not present

## 2014-10-08 DIAGNOSIS — Z981 Arthrodesis status: Secondary | ICD-10-CM | POA: Diagnosis not present

## 2014-10-08 DIAGNOSIS — M4322 Fusion of spine, cervical region: Secondary | ICD-10-CM | POA: Diagnosis not present

## 2014-10-13 NOTE — Telephone Encounter (Signed)
PA denied, appeal started 

## 2014-10-15 ENCOUNTER — Encounter: Payer: Self-pay | Admitting: Critical Care Medicine

## 2014-10-15 ENCOUNTER — Encounter: Payer: Self-pay | Admitting: Internal Medicine

## 2014-10-15 ENCOUNTER — Ambulatory Visit (INDEPENDENT_AMBULATORY_CARE_PROVIDER_SITE_OTHER): Payer: Medicare Other | Admitting: Critical Care Medicine

## 2014-10-15 VITALS — BP 153/82 | HR 80 | Temp 97.5°F | Ht 62.0 in | Wt 128.0 lb

## 2014-10-15 DIAGNOSIS — R0602 Shortness of breath: Secondary | ICD-10-CM

## 2014-10-15 DIAGNOSIS — J411 Mucopurulent chronic bronchitis: Secondary | ICD-10-CM | POA: Diagnosis not present

## 2014-10-15 MED ORDER — ARFORMOTEROL TARTRATE 15 MCG/2ML IN NEBU: 2.0000 mL | INHALATION_SOLUTION | Freq: Two times a day (BID) | RESPIRATORY_TRACT | Status: DC

## 2014-10-15 MED ORDER — PREDNISONE 20 MG PO TABS: 20.0000 mg | ORAL_TABLET | Freq: Every day | ORAL | Status: DC

## 2014-10-15 MED ORDER — MONTELUKAST SODIUM 10 MG PO TABS: 10.0000 mg | ORAL_TABLET | Freq: Every day | ORAL | Status: DC

## 2014-10-15 MED ORDER — MOMETASONE FUROATE 50 MCG/ACT NA SUSP: NASAL | Status: DC

## 2014-10-15 MED ORDER — ALBUTEROL SULFATE HFA 108 (90 BASE) MCG/ACT IN AERS: 1.0000 | INHALATION_SPRAY | RESPIRATORY_TRACT | Status: DC | PRN

## 2014-10-15 MED ORDER — BUDESONIDE 0.25 MG/2ML IN SUSP: 0.2500 mg | Freq: Two times a day (BID) | RESPIRATORY_TRACT | Status: DC

## 2014-10-15 NOTE — Progress Notes (Signed)
Subjective:    Patient ID: [name], female    DOB: [birth year], 81 y.o.   MRN: [id number]  HPI 10/15/2014 Chief Complaint  Patient presents with  . Yearly Follow up    Feels breathing is doing "great."  Coughing qhs but this is very seldom.  No SOB, wheezing, chest tightness, or CP.      Since last ov 2014:  Pt had breast leaking blood, surgery milk duct issue< no CA seen.  Two weeks ago carpal tunnel surgery, Gramm[place]. Uses cast at night.   Doing well with dyspne  Pt denies any significant sore throat, nasal congestion or excess secretions, fever, chills, sweats, unintended weight loss, pleurtic or exertional chest pain, orthopnea PND, or leg swelling Pt denies any increase in rescue therapy over baseline, denies waking up needing it or having any early am or nocturnal exacerbations of coughing/wheezing/or dyspnea. Pt also denies any obvious fluctuation in symptoms with  weather or environmental change or other alleviating or aggravating factors   Current Medications, Allergies, Complete Past Medical History, Past Surgical History, Family History, and Social History were reviewed in [employer] record.  Past Medical History  Diagnosis Date  . Anemia   . Asthma -COPD   . Chronic fatigue syndrome   . GERD (gastroesophageal reflux disease)   . Hyperlipidemia   . Hypertension   . Colon polyp   . Allergic rhinitis   . Vitamin D deficiency   . Pulmonary embolism     1980s  . Arthritis   . 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 Blood transfusion 1957  . Osteoporosis   . Hypothyroid   . Constipation   . Seizures     1990 last seizures on meds Phenobarb  . Skin cancer     squamous cell  . Neurogenic bladder 2013    husband caths pt  . chronic sinusitis 08/08/2012  . Shingles late 28's  . Cystocele 09/16/2012  . Tubular adenoma   . Breast mass     right breast in milk duct, bx neg     Family History  Problem Relation Age  of Onset  . Heart disease Mother   . Hyperlipidemia Mother     Jerilynn Mages, aunt  . Ovarian cancer Sister   . Lung cancer Sister     lung - stage 1  . Thyroid cancer Sister   . Colon cancer Father     71  . Stomach cancer Other     first cousin  . Skin cancer Daughter   . Malignant hyperthermia Cousin   . Breast cancer Neg Hx      History   Social History  . Marital Status: Married    Spouse Name: N/A  . Number of Children: 3  . Years of Education: N/A   Occupational History  . retired, Licensed conveyancer    Social History Main Topics  . Smoking status: Former Smoker -- 0.50 packs/day for 3 years    Types: Cigarettes    Quit date: 05/29/1978  . Smokeless tobacco: Never Used     Comment: quit smoking 35 years ago  . Alcohol Use: No  . Drug Use: No  . Sexual Activity: Yes   Other Topics Concern  . Not on file   Social History Narrative   Pt lives at home with her spouse.   Moved to [place] ~ 2004        Allergies  Allergen Reactions  . Iodine Other (See Comments)  Cardiac arrest  . Morphine And Related Other (See Comments)    Cardiac arrest     Outpatient Prescriptions Prior to Visit  Medication Sig Dispense Refill  . amLODipine (NORVASC) 10 MG tablet Take 1 tablet (10 mg total) by mouth every morning. 90 tablet 2  . amphetamine-dextroamphetamine (ADDERALL) 10 MG tablet Take 1 tablet (10 mg total) by mouth 4 (four) times daily. May 120 tablet 0  . arformoterol (BROVANA) 15 MCG/2ML NEBU Take 2 mLs (15 mcg total) by nebulization 2 (two) times daily. 120 mL 6  . Ascorbic Acid (VITAMIN C PO) Take 2 capsules by mouth daily.    Marland Kitchen atorvastatin (LIPITOR) 40 MG tablet TAKE ONE (1) TABLET EACH DAY 90 tablet 1  . budesonide (PULMICORT) 0.25 MG/2ML nebulizer solution Take 2 mLs (0.25 mg total) by nebulization 2 (two) times daily. 60 mL 6  . CALCIUM-VITAMIN D PO Take 1 tablet by mouth daily.     . carbamazepine (TEGRETOL XR) 100 MG 12 hr tablet Take by mouth. 3 tablets in the morning  and 2 tablets at night    . Cholecalciferol (VITAMIN D) 2000 UNITS CAPS Take 1 capsule by mouth daily.    Marland Kitchen denosumab (PROLIA) 60 MG/ML SOLN injection Inject 60 mg into the skin every 6 (six) months. Administer in upper arm, thigh, or abdomen    . dexlansoprazole (DEXILANT) 60 MG capsule Take 1 capsule (60 mg total) by mouth daily. SHOULD TAKE DAILY, NOT PRN! 30 capsule 2  . diclofenac sodium (VOLTAREN) 1 % GEL Apply 1 application topically 4 (four) times daily as needed. Applies to fingers and hands for gout.    Marland Kitchen levothyroxine (SYNTHROID, LEVOTHROID) 25 MCG tablet Take 1.5 tablets (37.5 mcg total) by mouth daily. 45 tablet 3  . mometasone (NASONEX) 50 MCG/ACT nasal spray PLACE 2 SPRAYS INTO THE NOSE DAILY 17 g 6  . montelukast (SINGULAIR) 10 MG tablet Take 1 tablet (10 mg total) by mouth at bedtime. 90 tablet 4  . ondansetron (ZOFRAN-ODT) 4 MG disintegrating tablet Take 1 tablet (4 mg total) by mouth daily. 90 tablet 0  . PHENobarbital (LUMINAL) 32.4 MG tablet Take 1 tablet (32.4 mg total) by mouth 4 (four) times daily. one tablet at 6 am, one tablet at noon,  two tabletss at 8 pm. 120 tablet 5  . polyethylene glycol (MIRALAX / GLYCOLAX) packet Take 17 g by mouth at bedtime. 14 each   . predniSONE (DELTASONE) 20 MG tablet Take 20 mg by mouth daily.     Marland Kitchen PROAIR HFA 108 (90 BASE) MCG/ACT inhaler Inhale 1 puff into the lungs as needed.      No facility-administered medications prior to visit.   Review of Systems  Constitutional: Negative for fever, chills, diaphoresis, appetite change, fatigue and unexpected weight change.  HENT: Negative for congestion, ear discharge, ear pain, hearing loss, nosebleeds, postnasal drip, rhinorrhea, sinus pressure, sneezing, sore throat, trouble swallowing and voice change.   Eyes: Negative for discharge and itching.  Respiratory: Positive for cough and shortness of breath. Negative for apnea, choking, chest tightness, wheezing and stridor.   Cardiovascular:  Negative for chest pain, palpitations and leg swelling.  Gastrointestinal: Negative for nausea, vomiting, abdominal pain and abdominal distention.  Endocrine: Negative.   Genitourinary: Negative.   Musculoskeletal: Negative for myalgias, joint swelling and arthralgias.  Skin: Negative for rash.  Allergic/Immunologic: Negative for environmental allergies.  Neurological: Negative for dizziness, syncope, weakness and headaches.  Hematological: Negative for adenopathy. Does not bruise/bleed easily.  Psychiatric/Behavioral:  Negative for sleep disturbance and agitation. The patient is not nervous/anxious.        Objective:   Physical Exam  Constitutional: She is oriented to person, place, and time. She appears well-developed and well-nourished. She is active.  HENT:  Head: Normocephalic and atraumatic.  Nose: No mucosal edema, rhinorrhea, sinus tenderness, nasal deformity or septal deviation. No epistaxis. Right sinus exhibits no maxillary sinus tenderness and no frontal sinus tenderness. Left sinus exhibits no maxillary sinus tenderness and no frontal sinus tenderness.  Mouth/Throat: Oropharynx is clear and moist. No oropharyngeal exudate.  Eyes: Conjunctivae and EOM are normal. Pupils are equal, round, and reactive to light. No scleral icterus.  Neck: Trachea normal and normal range of motion. Neck supple. No JVD present. No tracheal tenderness and no muscular tenderness present. Carotid bruit is not present. No rigidity. No tracheal deviation, no edema, no erythema and normal range of motion present. No thyromegaly present.  Cardiovascular: Normal rate, regular rhythm, S1 normal, S2 normal, normal heart sounds, intact distal pulses and normal pulses.  PMI is not displaced.  Exam reveals no gallop, no S3, no S4, no distant heart sounds and no friction rub.   No murmur heard.  No systolic murmur is present   No diastolic murmur is present  Pulmonary/Chest: No accessory muscle usage or stridor.  No apnea and no tachypnea. No respiratory distress. She has decreased breath sounds in the right lower field and the left lower field. She has no wheezes. She has no rhonchi. She has no rales. Chest wall is not dull to percussion. She exhibits no mass, no tenderness, no bony tenderness and no deformity.  Abdominal: Soft. Normal appearance and bowel sounds are normal. She exhibits no distension and no ascites. There is no hepatosplenomegaly. There is no tenderness. There is no rigidity, no rebound and no guarding.  Musculoskeletal: Normal range of motion.  Lymphadenopathy:       Head (right side): No submental and no submandibular adenopathy present.       Head (left side): No submental and no submandibular adenopathy present.    She has no cervical adenopathy.  Neurological: She is alert and oriented to person, place, and time. She has normal strength. No sensory deficit.  Skin: Skin is warm and dry. No rash noted. She is not diaphoretic. No pallor. Nails show no clubbing.  Psychiatric: She has a normal mood and affect. Her speech is normal and behavior is normal.  Vitals reviewed.         Assessment & Plan:  I personally reviewed all images and lab data in the [facility] system as well as any outside material available during this office visit and agree with the  radiology impressions.  I also have reviewed any data /notes/records if available in care everywhere.  COPD (chronic obstructive pulmonary disease) gold stage B Copd Gold B Improved Spirometry from prior study Atopic reversable component Steroid dependent Plan Cont pred 20mg  /d  Cont brovana/budesonide Cont singulair No other changes    Rhaya was seen today for yearly follow up.  Diagnoses and all orders for this visit:  SOB (shortness of breath) Orders: -     Spirometry with Graph  Mucopurulent chronic bronchitis  Other orders -     budesonide (PULMICORT) 0.25 MG/2ML nebulizer solution; Take 2 mLs (0.25 mg total) by  nebulization 2 (two) times daily. -     arformoterol (BROVANA) 15 MCG/2ML NEBU; Take 2 mLs (15 mcg total) by nebulization 2 (two) times daily. -  mometasone (NASONEX) 50 MCG/ACT nasal spray; PLACE 2 SPRAYS INTO THE NOSE DAILY -     montelukast (SINGULAIR) 10 MG tablet; Take 1 tablet (10 mg total) by mouth at bedtime. -     predniSONE (DELTASONE) 20 MG tablet; Take 1 tablet (20 mg total) by mouth daily. -     albuterol (PROAIR HFA) 108 (90 BASE) MCG/ACT inhaler; Inhale 1 puff into the lungs as needed.    I had an extended discussion with the patient reviewing all relevant studies completed to date and  lasting 15 to 20 minutes of a 25 minute visit on the following ongoing concerns:  Improved disease state, cont treatment

## 2014-10-15 NOTE — Assessment & Plan Note (Addendum)
Copd Gold B Improved Spirometry from prior study Atopic reversable component Steroid dependent Plan Cont pred 20mg  /d  Cont brovana/budesonide Cont singulair No other changes

## 2014-10-15 NOTE — Patient Instructions (Signed)
Refills on all lung meds issued for 12 months No change in medications Follow up on PT consult

## 2014-10-19 ENCOUNTER — Encounter: Payer: Self-pay | Admitting: Internal Medicine

## 2014-10-20 ENCOUNTER — Telehealth: Payer: Self-pay | Admitting: Internal Medicine

## 2014-10-20 DIAGNOSIS — E041 Nontoxic single thyroid nodule: Secondary | ICD-10-CM

## 2014-10-20 NOTE — Telephone Encounter (Signed)
1. MRI done at [facility] 09/11/2014 for neck pain show right thyroid lobe nodule, 2.3 cm in size. Incidental finding. I just enter a order for a thyroid ultrasound.  2. Also, patient needs an alternative for Adderall, please bring me a list of alternatives

## 2014-10-20 NOTE — Telephone Encounter (Signed)
Per letter that was sent from Pt, did she have Adderall alternatives attached?

## 2014-10-21 ENCOUNTER — Ambulatory Visit (HOSPITAL_BASED_OUTPATIENT_CLINIC_OR_DEPARTMENT_OTHER)
Admission: RE | Admit: 2014-10-21 | Discharge: 2014-10-21 | Disposition: A | Discharge status: Home / Self Care | Payer: Medicare Other | Source: Ambulatory Visit | Attending: Internal Medicine | Admitting: Internal Medicine

## 2014-10-21 DIAGNOSIS — E041 Nontoxic single thyroid nodule: Secondary | ICD-10-CM

## 2014-10-21 DIAGNOSIS — E042 Nontoxic multinodular goiter: Secondary | ICD-10-CM | POA: Diagnosis not present

## 2014-10-22 ENCOUNTER — Other Ambulatory Visit: Payer: Self-pay | Admitting: Internal Medicine

## 2014-10-22 ENCOUNTER — Encounter: Payer: Self-pay | Admitting: Internal Medicine

## 2014-10-22 DIAGNOSIS — Z981 Arthrodesis status: Secondary | ICD-10-CM | POA: Diagnosis not present

## 2014-10-22 DIAGNOSIS — M4802 Spinal stenosis, cervical region: Secondary | ICD-10-CM | POA: Diagnosis not present

## 2014-10-22 DIAGNOSIS — E042 Nontoxic multinodular goiter: Secondary | ICD-10-CM

## 2014-10-22 DIAGNOSIS — M4322 Fusion of spine, cervical region: Secondary | ICD-10-CM | POA: Diagnosis not present

## 2014-10-28 ENCOUNTER — Telehealth: Payer: Self-pay | Admitting: Internal Medicine

## 2014-10-28 NOTE — Telephone Encounter (Signed)
Caller name: Shaunna Relation to pt: self Call back number: [phone] Pharmacy:  Reason for call:   Patient inquiring about US biopsy appt. Please see appt notes in Epic. Looks like radiology is needing another order from Dr. Larose Kells.   Also, patient is wanting to know what she can take in the place of adderall. She states that she is sleeping all of the time since she has been off of adderall

## 2014-10-28 NOTE — Telephone Encounter (Signed)
Advise patient: I just sent a message to radiology regards to thyroid biopsy. They should be scheduling the procedure soon. As far as adderall , I will have to make a phone call before we decide what is the best alternative.

## 2014-10-28 NOTE — Telephone Encounter (Signed)
Noted  

## 2014-10-28 NOTE — Telephone Encounter (Signed)
Please see below. As you know, Pt is either unable to afford Adderall or insurance will not pay. According to [facility] preferred med list Adderall is a Tier 3 drug. Dextroamphetamine 5 mg (they will approve 90 tabs in 30 days) and 10 mg (they will approve 180 tabs in 30 days) which is Tier 2, which may be cheaper for Pt. As well as they will cover Dexmethylphenidate 2.5, 5 and 10 mg (they will approve 60 tabs in 30 days) which is also a Tier 2. Please advise.

## 2014-10-29 NOTE — Telephone Encounter (Signed)
Relation to pt: self Call back number: home # [phone] / cell # [phone]   Reason for call:  Pt would like to speak with CMA or MD regarding the update on her initial request. Please advise

## 2014-10-30 MED ORDER — DEXTROAMPHETAMINE SULFATE 10 MG PO TABS: 10.0000 mg | ORAL_TABLET | Freq: Three times a day (TID) | ORAL | Status: DC

## 2014-10-30 NOTE — Telephone Encounter (Signed)
FYI

## 2014-10-30 NOTE — Telephone Encounter (Signed)
Relation to pt: self Call back number: home # [phone]   Pt would like a call back. She states that she doesn't understand what we sent to radiology and where to go. No one has contacted her about an appt. She said this is too much trouble. She would have had it done at [facility] if she knew it would be an issue. Please contact her directly.

## 2014-10-30 NOTE — Telephone Encounter (Signed)
Spoke with Pt, informed her that we can changed Adderall to Dextrostat, and I have placed Rx at front desk. As far as her thyroid Bx goes Pt would like to have it completed in town if possible. Informed her that we would try our best to get the Bx ordered correctly. I informed Pt that I would discuss with Radiology and Dr. Larose Kells, and would try my best to call her back this afternoon if not Monday morning. Also informed Pt to let us know if she has any problems with the new Rx at the pharmacy. Pt verbalized understanding.    Per Radiology, they need orders for BOTH Thyroid Nodules for Bx before it can be scheduled. Since thyroid had bilateral nodules, orders must be placed for both bilateral nodules.

## 2014-10-30 NOTE — Addendum Note (Signed)
Addended by: Kathlene November E on: 10/30/2014 11:08 AM   Modules accepted: Orders, Medications

## 2014-10-30 NOTE — Telephone Encounter (Addendum)
1. Dextroamphetamine is indicated for narcolepsy   consequently will prescribe 10 mg 3 times a day. #90, no refills, may need to titrate up. Prescription printed. 2. As far as the biopsy, if she likes it to be done at [facility], please arrange a referral to see endocrinology, I can't order a procedure at [facility] [place]

## 2014-11-02 ENCOUNTER — Telehealth: Payer: Self-pay | Admitting: Internal Medicine

## 2014-11-02 ENCOUNTER — Encounter: Payer: Self-pay | Admitting: Internal Medicine

## 2014-11-02 NOTE — Telephone Encounter (Signed)
FYI

## 2014-11-02 NOTE — Telephone Encounter (Signed)
Caller name: Baker Janus with [facility] Can be reached: [phone]  Reason for call: States she has been back and forth with Dr. Larose Kells. She says that he has requested biopsy on 3 dominant nodules. She states they have 1 order and will need 2 more (1 order per nodule needing biopsy). She needs the specific locations/nodules for biopsy as well.

## 2014-11-02 NOTE — Telephone Encounter (Signed)
Spoke with [name], informed her that Dr. Larose Kells spoke with Dr. Benson Norway (spelling uncertain) there and he informed Dr. Larose Kells that only 1 order is sufficient for thyroid biopsy and that we can word orders as such that at the discretion of the radiologist. [name] verbalized understanding.

## 2014-11-02 NOTE — Telephone Encounter (Signed)
Please inform Baker Janus that I spoke directly with the interventional  radiologist, it is sufficient to send 1 request, so I will  phrase this request as follows: "Please biopsy the 2 dominant nodules at the discretion of the radiologist"  Let me know if we have problems scheduling the procedure

## 2014-11-03 ENCOUNTER — Ambulatory Visit: Payer: Medicare Other | Attending: Physician Assistant | Admitting: Rehabilitative and Restorative Service Providers"

## 2014-11-03 ENCOUNTER — Other Ambulatory Visit: Payer: Self-pay | Admitting: Internal Medicine

## 2014-11-03 DIAGNOSIS — R531 Weakness: Secondary | ICD-10-CM | POA: Insufficient documentation

## 2014-11-03 DIAGNOSIS — M1990 Unspecified osteoarthritis, unspecified site: Secondary | ICD-10-CM | POA: Insufficient documentation

## 2014-11-03 DIAGNOSIS — E785 Hyperlipidemia, unspecified: Secondary | ICD-10-CM | POA: Diagnosis not present

## 2014-11-03 DIAGNOSIS — G6181 Chronic inflammatory demyelinating polyneuritis: Secondary | ICD-10-CM | POA: Insufficient documentation

## 2014-11-03 DIAGNOSIS — E042 Nontoxic multinodular goiter: Secondary | ICD-10-CM

## 2014-11-03 DIAGNOSIS — K219 Gastro-esophageal reflux disease without esophagitis: Secondary | ICD-10-CM | POA: Diagnosis not present

## 2014-11-03 DIAGNOSIS — I10 Essential (primary) hypertension: Secondary | ICD-10-CM | POA: Insufficient documentation

## 2014-11-03 DIAGNOSIS — R269 Unspecified abnormalities of gait and mobility: Secondary | ICD-10-CM

## 2014-11-03 DIAGNOSIS — E559 Vitamin D deficiency, unspecified: Secondary | ICD-10-CM | POA: Insufficient documentation

## 2014-11-03 DIAGNOSIS — M810 Age-related osteoporosis without current pathological fracture: Secondary | ICD-10-CM | POA: Insufficient documentation

## 2014-11-03 DIAGNOSIS — R5383 Other fatigue: Secondary | ICD-10-CM | POA: Diagnosis not present

## 2014-11-03 NOTE — Therapy (Signed)
[facility] [address] [place], [place], [address] Phone: [phone]   Fax:  [phone]  Physical Therapy Evaluation  Patient Details  Name: [name] MRN: [id number] Date of Birth: Nov 16, 1932 Referring Provider:  Erline Hau, PA-C  Encounter Date: 11/03/2014      PT End of Session - 11/03/14 1250    Visit Number 1   Number of Visits 16   Date for PT Re-Evaluation 12/31/14   PT Start Time 1105   PT Stop Time 1150   PT Time Calculation (min) 45 min   Equipment Utilized During Treatment Gait belt   Activity Tolerance Patient tolerated treatment well   Behavior During Therapy [facility] for tasks assessed/performed      Past Medical History  Diagnosis Date  . Anemia   . Asthma -COPD   . Chronic fatigue syndrome   . GERD (gastroesophageal reflux disease)   . Hyperlipidemia   . Hypertension   . Colon polyp   . Allergic rhinitis   . Vitamin D deficiency   . Pulmonary embolism     1980s  . Arthritis   . 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 Blood transfusion 1957  . Osteoporosis   . Hypothyroid   . Constipation   . Seizures     1990 last seizures on meds Phenobarb  . Skin cancer     squamous cell  . Neurogenic bladder 2013    husband caths pt  . chronic sinusitis 08/08/2012  . Shingles late 88's  . Cystocele 09/16/2012  . Tubular adenoma   . Breast mass     right breast in milk duct, bx neg  . Thyroid nodule     Right thyroid lobe, only seen on Sagittal imaging measures 2.3 cm in craniocaudal dimension and appears stable    Past Surgical History  Procedure Laterality Date  . Tonsillectomy and adenoidectomy    . Nasal septum surgery    . Dilation and curettage of uterus    . Tubal ligation    . Cesarean section    . Sinus surgeries      x 4  . Left ovary and tube removed    . Vocal polyps removed    . Appendectomy    . Cystocele repair    . Carpal tunnel release       right  . Shoulder arthroscopy      x2 left, 1 right  . Left finger fusion      3 fingers on left hand/one finger right  . Right median nerve decompression    . Duptyren's contracture right hand    . Finger ganglion cyst excision      right  . Abdominal hysterectomy    . Joint replacement      right and left basal joints of thumbs  . Bladder suspension    . Cataract extraction      bilateral  . Cervical neck ablation      x 7, C3-C6/3 screws and plate  . Squamous lesions removed      neck and face  . Basal cell carcinoma excision      face  . Panniculectomy    . Nasal polyp surgery      4 sinus surgeries  . Anterior fusion clivus-c2 extraoral w/ odontoid excision  8/14  . Proximal interphalangeal fusion (pip) Left 09/09/2013    Procedure: FUSION LEFT INDEX PROXIMAL INTERPHALANGEAL JOINT (PIP);  Surgeon: Cammie Sickle., MD;  Location: [place];  Service: Orthopedics;  Laterality: Left;  . Breast ductal system excision Right 01/15/2014    Procedure: EXCISION DUCTAL SYSTEM RIGHT BREAST;  Surgeon: Stark Klein, MD;  Location: [place];  Service: General;  Laterality: Right;    There were no vitals filed for this visit.  Visit Diagnosis:  Abnormality of gait  Generalized weakness      Subjective Assessment - 11/03/14 1117    Subjective The patient reports increasing falls and weakness over the past few months.  Her husband notes difficulty and weakness ascending steps.  Pt is not able to negotiate unlevel surfaces safety at home.  She has a Marketing executive RW, however uses a quad cane in the home with husband's supervision.   Patient is accompained by: Family member   Patient Stated Goals Improve gait and balance; husband reports provide HEP and improve steps   Currently in Pain? No/denies            [facility] PT Assessment - 11/03/14 1109    Assessment   Medical Diagnosis cervical spinal stenosis with fusion, neck  UE/stretching/strengthening, mobility, gait, 2-3x/week   Onset Date/Surgical Date --  2014   Prior Therapy at our clinic received balance therapy, gait training   Precautions   Precautions Fall   Balance Screen   Has the patient fallen in the past 6 months Yes   How many times? 5   Has the patient had a decrease in activity level because of a fear of falling?  Yes   Is the patient reluctant to leave their home because of a fear of falling?  Yes   [place] Private residence   Living Arrangements Spouse/significant other   Available Help at Discharge Available 24 hours/day   Type of [place] to enter   Entrance Stairs-Number of Steps 3   Entrance Stairs-Rails --  No rail in garage   [place] - 4 wheels;Shower seat;Bedside commode;Toilet riser  bed rail   Observation/Other Assessments   Focus on Therapeutic Outcomes (FOTO)  48%   Sensation   Light Touch Impaired Detail   Light Touch Impaired Details Absent RLE;Absent LLE   ROM / Strength   AROM / PROM / Strength AROM;Strength   AROM   Overall AROM  Within functional limits for tasks performed   Strength   Overall Strength Comments 4/5 bilateral UEs for shoulder flexion/abduction, 4/5 bilateral elbow flexion/extension, 3/5 bilateral hip flexion, 4/5 bilateral knee flexion/extension, 3/5 bilateral ankle dorsiflexion   Bed Mobility   Bed Mobility Supine to Sit;Sit to Supine   Supine to Sit 7: Independent   Sit to Supine 7: Independent   Transfers   Transfers Sit to Stand;Stand to Sit   Ambulation/Gait   Ambulation/Gait Yes   Ambulation/Gait Assistance 5: Supervision   Assistive device Large base quad cane   Gait Pattern --  narrow base of support, frequent instability   Ambulation Surface Level   Gait velocity 1.89 ft/sec   Stairs Yes   Stairs Assistance 5: Supervision   Stair Management Technique Step to pattern;Two rails    Number of Stairs --  4   Standardized Balance Assessment   Standardized Balance Assessment Berg Balance Test   Berg Balance Test   Sit to Stand Able to stand  independently using hands   Standing Unsupported Able to stand safely 2 minutes   Sitting  with Back Unsupported but Feet Supported on Floor or Stool Able to sit safely and securely 2 minutes   Stand to Sit Controls descent by using hands   Transfers Able to transfer safely, definite need of hands   Standing Unsupported with Eyes Closed Able to stand 3 seconds   Standing Ubsupported with Feet Together Able to place feet together independently and stand for 1 minute with supervision   From Standing, Reach Forward with Outstretched Arm Can reach forward >12 cm safely (5")   From Standing Position, Pick up Object from [place] to pick up shoe safely and easily   From Standing Position, Turn to Look Behind Over each Shoulder Needs supervision when turning   Turn 360 Degrees Needs assistance while turning   Standing Unsupported, Alternately Place Feet on Step/Stool Needs assistance to keep from falling or unable to try   Standing Unsupported, One Foot in [place] to take small step independently and hold 30 seconds   Standing on One Leg Unable to try or needs assist to prevent fall   Total Score 32               PT Short Term Goals - 11/03/14 1251    PT SHORT TERM GOAL #1   Title The patient will be indep with HEP for LE strengthening, balance and gait.   Baseline Target date 12/02/2014   Time 4   Period Weeks   PT SHORT TERM GOAL #2   Title The patient will improve Berg from 32/56 up to 38/56 to demo decreasing risk for falls.   Baseline Target date 12/02/2014   Time 4   Period Weeks   PT SHORT TERM GOAL #3   Title The patient will improve gait speed from 1.89 ft/sec up to 2.1 ft/sec to demo improving mobility.   Baseline Target date 12/02/2014   Time 4   Period Weeks   PT SHORT TERM GOAL #4   Title The patient will  negotiate 4 steps with reciproal pattern with bilateral handrails modified indep.   Baseline Target date 12/02/2014   Time 4   Period Weeks           PT Long Term Goals - 11/03/14 1301    PT LONG TERM GOAL #1   Title The patient will have gym routine for post d/c wellness with supervision from husband.   Baseline Target date 01/01/2015   Time 8   Period Weeks   PT LONG TERM GOAL #2   Title The patient will improve Berg from 32/56 up to 42/56 to demo decreasing risk for falls.   Baseline Target date 01/01/2015   Time 8   Period Weeks   PT LONG TERM GOAL #3   Title The patient will improve gait speed from 1.89 ft/sec up to 2.4 ft/sec to demo improved mobility.   Baseline Target date 01/01/2015   Time 8   Period Weeks   PT LONG TERM GOAL #4   Title The patient will negotiate outdoor surfaces with rollater RW modified indep.   Baseline Target date 01/01/2015   Time 8   Period Weeks   PT LONG TERM GOAL #5   Title The patient will improve functional status survey results from 48% up to 60% for improved subjective report of mobility.   Baseline Target date 01/01/2015   Time 8   Period Weeks               Plan - 11/03/14 1304  Clinical Impression Statement he patient is an 81 yo female known to our clinic from prior PT for balance/gait.  She reports worsening balance and increased falls and presents today with high fall risk per Merrilee Jansky and gait speed.     Pt will benefit from skilled therapeutic intervention in order to improve on the following deficits Abnormal gait;Decreased balance;Impaired sensation;Postural dysfunction;Decreased mobility;Decreased coordination;Decreased strength;Decreased endurance;Difficulty walking   Rehab Potential Good   Clinical Impairments Affecting Rehab Potential significant neuropathy in LEs   PT Frequency 2x / week   PT Duration 8 weeks   PT Treatment/Interventions Gait training;ADLs/Self Care Home Management;Neuromuscular re-education;Balance  training;Therapeutic exercise;Therapeutic activities;Functional mobility training;Stair training;Patient/family education   PT Next Visit Plan begin HEP, LE strength + balance, gait training   Consulted and Agree with Plan of Care Patient;Family member/caregiver   Family Member Consulted spouse          G-Codes - 2014-11-09 1307    Functional Assessment Tool Used Berg=32/56 ; Gait speed=1.89 ft/sec with large based quad cane   Functional Limitation Mobility: Walking and moving around   Mobility: Walking and Moving Around Current Status [phone]) At least 40 percent but less than 60 percent impaired, limited or restricted   Mobility: Walking and Moving Around Goal Status [phone]) At least 20 percent but less than 40 percent impaired, limited or restricted       Problem List Patient Active Problem List   Diagnosis Date Noted  . Annual physical exam 08/07/2014  . Bloody discharge from nipple 12/29/2013  . Breast mass, right 12/11/2013  . At high risk for falls 12/11/2013  . Renal cysts, acquired, bilateral 05/04/2013  . Elevated LFTs 04/01/2013  . Cervical spinal stenosis 11/21/2012  . Nondiabetic gastroparesis 08/20/2012  . Unspecified hypothyroidism 08/08/2012  . Gastroparesis 05/06/2012  . Muscle cramp 01/19/2012  . Fatigue 01/14/2012  . Neurogenic bladder 11/05/2011  . Fecal incontinence 10/03/2011  . Osteoporosis 07/24/2011  . Adenoma of cecum 07/17/2011  . Chronic constipation 07/17/2011  . Skin lesion 07/08/2011  . Chronic neck pain 04/16/2011  . Rectocele 04/16/2011  . Hypersomnia 04/16/2011  . Vitamin D deficiency 04/16/2011  . Family history of colon cancer 04/16/2011  . Abnormal blood chemistry 04/16/2011  . Anemia   . COPD (chronic obstructive pulmonary disease) gold stage B   . Depression   . GERD (gastroesophageal reflux disease)   . Hyperlipidemia   . Hypertension   . Allergic rhinitis   . Pulmonary embolism   . Skin cancer   . Seizures   . CIDP (chronic  inflammatory demyelinating polyneuropathy)     Thank you for the referral of this patient.   [place], Dwight 11-09-2014, 1:07 PM  [facility] [address] [place], [place], [address] Phone: [phone]   Fax:  [phone]

## 2014-11-04 ENCOUNTER — Encounter: Payer: Self-pay | Admitting: Internal Medicine

## 2014-11-05 ENCOUNTER — Ambulatory Visit: Payer: Medicare Other | Admitting: Rehabilitative and Restorative Service Providers"

## 2014-11-05 DIAGNOSIS — E785 Hyperlipidemia, unspecified: Secondary | ICD-10-CM | POA: Diagnosis not present

## 2014-11-05 DIAGNOSIS — K219 Gastro-esophageal reflux disease without esophagitis: Secondary | ICD-10-CM | POA: Diagnosis not present

## 2014-11-05 DIAGNOSIS — R531 Weakness: Secondary | ICD-10-CM | POA: Diagnosis not present

## 2014-11-05 DIAGNOSIS — I10 Essential (primary) hypertension: Secondary | ICD-10-CM | POA: Diagnosis not present

## 2014-11-05 DIAGNOSIS — R5383 Other fatigue: Secondary | ICD-10-CM | POA: Diagnosis not present

## 2014-11-05 DIAGNOSIS — R269 Unspecified abnormalities of gait and mobility: Secondary | ICD-10-CM

## 2014-11-05 NOTE — Patient Instructions (Addendum)
Thoracic Self-Mobilization (Supine)   With rolled towel placed lengthwise at lower ribs level, lie back on towel with arms outstretched. Hold __2 minutes. Relax.  Do __1-2__ sessions per day.  http://orth.exer.us/1001   Copyright  VHI. All rights reserved.  Functional Quadriceps: Sit to Stand   Sit on edge of chair, feet flat on floor. Stand upright, extending knees fully.  FOCUS ON TALL POSTURE WITH SHOULDERS BACK AND DOWN. Repeat __10__ times per set. Do _1___ sets per session. Do __2__ sessions per day.  http://orth.exer.us/735   Copyright  VHI. All rights reserved.  Weight Shift: Anterior / Posterior (Righting / Equilibrium)   Stand with feet about 6-8" away from wall and hips/body leaning on wall.  Bring hips away from wall to come to upright standing with tall posture.  Hold for 3 seconds and then bump your bottom on the wall. Repeat _10___ times per session. Do _2___ sessions per day.  Copyright  VHI. All rights reserved.   Feet Apart, Varied Arm Positions - Eyes Closed   Stand with feet shoulder width apart and arms at your side. Close eyes and visualize upright position. Hold _10___ seconds. Repeat __4__ times per session. Do __2__ sessions per day.  Copyright  VHI. All rights reserved.  Toe Raise (Sitting)   MOVE FEET FORWARD ON THE FLOOR. Raise toes, keeping heels on floor. Repeat __10-20__ times per set.  Do __2__ sessions per day.  http://orth.exer.us/47   Copyright  VHI. All rights reserved.

## 2014-11-05 NOTE — Therapy (Signed)
[facility] [address] [place], [place], [address] Phone: [phone]   Fax:  [phone]  Physical Therapy Treatment  Patient Details  Name: [name] MRN: [id number] Date of Birth: 09-04-1932 Referring Provider:  Colon Branch, MD  Encounter Date: 11/05/2014      PT End of Session - 11/05/14 0930    Visit Number 2   Number of Visits 16   Date for PT Re-Evaluation 12/31/14   PT Start Time 0850   PT Stop Time 0930   PT Time Calculation (min) 40 min   Equipment Utilized During Treatment Gait belt   Activity Tolerance Patient tolerated treatment well   Behavior During Therapy [facility] for tasks assessed/performed      Past Medical History  Diagnosis Date  . Anemia   . Asthma -COPD   . Chronic fatigue syndrome   . GERD (gastroesophageal reflux disease)   . Hyperlipidemia   . Hypertension   . Colon polyp   . Allergic rhinitis   . Vitamin D deficiency   . Pulmonary embolism     1980s  . Arthritis   . 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 Blood transfusion 1957  . Osteoporosis   . Hypothyroid   . Constipation   . Seizures     1990 last seizures on meds Phenobarb  . Skin cancer     squamous cell  . Neurogenic bladder 2013    husband caths pt  . chronic sinusitis 08/08/2012  . Shingles late 71's  . Cystocele 09/16/2012  . Tubular adenoma   . Breast mass     right breast in milk duct, bx neg  . Thyroid nodule     Right thyroid lobe, only seen on Sagittal imaging measures 2.3 cm in craniocaudal dimension and appears stable    Past Surgical History  Procedure Laterality Date  . Tonsillectomy and adenoidectomy    . Nasal septum surgery    . Dilation and curettage of uterus    . Tubal ligation    . Cesarean section    . Sinus surgeries      x 4  . Left ovary and tube removed    . Vocal polyps removed    . Appendectomy    . Cystocele repair    . Carpal tunnel release       right  . Shoulder arthroscopy      x2 left, 1 right  . Left finger fusion      3 fingers on left hand/one finger right  . Right median nerve decompression    . Duptyren's contracture right hand    . Finger ganglion cyst excision      right  . Abdominal hysterectomy    . Joint replacement      right and left basal joints of thumbs  . Bladder suspension    . Cataract extraction      bilateral  . Cervical neck ablation      x 7, C3-C6/3 screws and plate  . Squamous lesions removed      neck and face  . Basal cell carcinoma excision      face  . Panniculectomy    . Nasal polyp surgery      4 sinus surgeries  . Anterior fusion clivus-c2 extraoral w/ odontoid excision  8/14  . Proximal interphalangeal fusion (pip) Left 09/09/2013    Procedure: FUSION LEFT INDEX PROXIMAL INTERPHALANGEAL JOINT (PIP);  Surgeon: Cammie Sickle., MD;  Location: [facility];  Service: Orthopedics;  Laterality: Left;  . Breast ductal system excision Right 01/15/2014    Procedure: EXCISION DUCTAL SYSTEM RIGHT BREAST;  Surgeon: Stark Klein, MD;  Location: [facility];  Service: General;  Laterality: Right;    There were no vitals filed for this visit.  Visit Diagnosis:  Abnormality of gait  Generalized weakness      Subjective Assessment - 11/05/14 2213    Subjective The patient's husband reports they stopped going to the gym this year because exercises were fatiguing (he reports he assisted Mrs. Mensinger during 30 min treadmill walk + 15 minutes balance sequence).   Currently in Pain? No/denies      THERAPEUTIC EXERCISE: Standing heel cord stretch Standing heel raises x 10 reps Seated ankle dorsiflexion x 10 reps Towel roll postural stretch Shoulder circles Sit<>stand x 10 reps with emphasis on posture  NEUROMUSCULAR RE-EDUCATION: Balance activities including wall bumps x 10 reps Standing with feet apart with eyes closed in corner for safety with  supervision Standing balance activities with head turns Standing trunk rotation with reaching for weight shifting activities Static standing balance activities in corner with CGA for safety  Gait: Ambulation with single point cane with tripod tip and CGA for safety x 230 ft  Dynamic gait with horizontal head turns with CGA for safety with single point cane with tripot tip x 100 ft        PT Education - 11/05/14 0928    Education provided Yes   Education Details HEP: standing eyes closed in corner, wall bumps, toe raises, sit<>stand, towel roll stretch   Person(s) Educated Patient;Spouse   Methods Explanation;Demonstration;Handout   Comprehension Returned demonstration;Verbalized understanding          PT Short Term Goals - 11/03/14 1251    PT SHORT TERM GOAL #1   Title The patient will be indep with HEP for LE strengthening, balance and gait.   Baseline Target date 12/02/2014   Time 4   Period Weeks   PT SHORT TERM GOAL #2   Title The patient will improve Berg from 32/56 up to 38/56 to demo decreasing risk for falls.   Baseline Target date 12/02/2014   Time 4   Period Weeks   PT SHORT TERM GOAL #3   Title The patient will improve gait speed from 1.89 ft/sec up to 2.1 ft/sec to demo improving mobility.   Baseline Target date 12/02/2014   Time 4   Period Weeks   PT SHORT TERM GOAL #4   Title The patient will negotiate 4 steps with reciproal pattern with bilateral handrails modified indep.   Baseline Target date 12/02/2014   Time 4   Period Weeks           PT Long Term Goals - 11/03/14 1301    PT LONG TERM GOAL #1   Title The patient will have gym routine for post d/c wellness with supervision from husband.   Baseline Target date 01/01/2015   Time 8   Period Weeks   PT LONG TERM GOAL #2   Title The patient will improve Berg from 32/56 up to 42/56 to demo decreasing risk for falls.   Baseline Target date 01/01/2015   Time 8   Period Weeks   PT LONG TERM GOAL #3   Title  The patient will improve gait speed from 1.89 ft/sec up to 2.4 ft/sec to demo improved mobility.   Baseline Target date 01/01/2015   Time  8   Period Weeks   PT LONG TERM GOAL #4   Title The patient will negotiate outdoor surfaces with rollater RW modified indep.   Baseline Target date 01/01/2015   Time 8   Period Weeks   PT LONG TERM GOAL #5   Title The patient will improve functional status survey results from 48% up to 60% for improved subjective report of mobility.   Baseline Target date 01/01/2015   Time 8   Period Weeks               Plan - 11/05/14 2214    Clinical Impression Statement The patient has good support from her husband to perform balance HEP.  She has decreased ankle DF with shortened heel cords noted.  PT to continue to progress balance and gait.   PT Next Visit Plan LE strengthening, balance, gait training, heel cord stretch, ankle DF   Consulted and Agree with Plan of Care Patient;Family member/caregiver   Family Member Consulted spouse        Problem List Patient Active Problem List   Diagnosis Date Noted  . Annual physical exam 08/07/2014  . Bloody discharge from nipple 12/29/2013  . Breast mass, right 12/11/2013  . At high risk for falls 12/11/2013  . Renal cysts, acquired, bilateral 05/04/2013  . Elevated LFTs 04/01/2013  . Cervical spinal stenosis 11/21/2012  . Nondiabetic gastroparesis 08/20/2012  . Unspecified hypothyroidism 08/08/2012  . Gastroparesis 05/06/2012  . Muscle cramp 01/19/2012  . Fatigue 01/14/2012  . Neurogenic bladder 11/05/2011  . Fecal incontinence 10/03/2011  . Osteoporosis 07/24/2011  . Adenoma of cecum 07/17/2011  . Chronic constipation 07/17/2011  . Skin lesion 07/08/2011  . Chronic neck pain 04/16/2011  . Rectocele 04/16/2011  . Hypersomnia 04/16/2011  . Vitamin D deficiency 04/16/2011  . Family history of colon cancer 04/16/2011  . Abnormal blood chemistry 04/16/2011  . Anemia   . COPD (chronic obstructive  pulmonary disease) gold stage B   . Depression   . GERD (gastroesophageal reflux disease)   . Hyperlipidemia   . Hypertension   . Allergic rhinitis   . Pulmonary embolism   . Skin cancer   . Seizures   . CIDP (chronic inflammatory demyelinating polyneuropathy)     Maisy Newport, PT 11/05/2014, 10:18 PM  [facility] [address] [place], [place], [address] Phone: [phone]   Fax:  [phone]

## 2014-11-09 ENCOUNTER — Ambulatory Visit: Payer: Medicare Other | Admitting: Rehabilitative and Restorative Service Providers"

## 2014-11-09 DIAGNOSIS — R5383 Other fatigue: Secondary | ICD-10-CM | POA: Diagnosis not present

## 2014-11-09 DIAGNOSIS — K219 Gastro-esophageal reflux disease without esophagitis: Secondary | ICD-10-CM | POA: Diagnosis not present

## 2014-11-09 DIAGNOSIS — R269 Unspecified abnormalities of gait and mobility: Secondary | ICD-10-CM

## 2014-11-09 DIAGNOSIS — R531 Weakness: Secondary | ICD-10-CM

## 2014-11-09 DIAGNOSIS — I10 Essential (primary) hypertension: Secondary | ICD-10-CM | POA: Diagnosis not present

## 2014-11-09 DIAGNOSIS — E785 Hyperlipidemia, unspecified: Secondary | ICD-10-CM | POA: Diagnosis not present

## 2014-11-09 NOTE — Therapy (Signed)
[facility] [address] [place], [place], [address] Phone: [phone]   Fax:  [phone]  Physical Therapy Treatment  Patient Details  Name: [name] MRN: [id number] Date of Birth: March 04, 1933 Referring Provider:  Colon Branch, MD  Encounter Date: 11/09/2014      PT End of Session - 11/09/14 2130    Visit Number 3   Number of Visits 16   Date for PT Re-Evaluation 12/31/14   PT Start Time 1020   PT Stop Time 1100   PT Time Calculation (min) 40 min   Equipment Utilized During Treatment Gait belt   Activity Tolerance Patient tolerated treatment well   Behavior During Therapy [facility] for tasks assessed/performed      Past Medical History  Diagnosis Date  . Anemia   . Asthma -COPD   . Chronic fatigue syndrome   . GERD (gastroesophageal reflux disease)   . Hyperlipidemia   . Hypertension   . Colon polyp   . Allergic rhinitis   . Vitamin D deficiency   . Pulmonary embolism     1980s  . Arthritis   . 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 Blood transfusion 1957  . Osteoporosis   . Hypothyroid   . Constipation   . Seizures     1990 last seizures on meds Phenobarb  . Skin cancer     squamous cell  . Neurogenic bladder 2013    husband caths pt  . chronic sinusitis 08/08/2012  . Shingles late 76's  . Cystocele 09/16/2012  . Tubular adenoma   . Breast mass     right breast in milk duct, bx neg  . Thyroid nodule     Right thyroid lobe, only seen on Sagittal imaging measures 2.3 cm in craniocaudal dimension and appears stable    Past Surgical History  Procedure Laterality Date  . Tonsillectomy and adenoidectomy    . Nasal septum surgery    . Dilation and curettage of uterus    . Tubal ligation    . Cesarean section    . Sinus surgeries      x 4  . Left ovary and tube removed    . Vocal polyps removed    . Appendectomy    . Cystocele repair    . Carpal tunnel release     right  . Shoulder arthroscopy      x2 left, 1 right  . Left finger fusion      3 fingers on left hand/one finger right  . Right median nerve decompression    . Duptyren's contracture right hand    . Finger ganglion cyst excision      right  . Abdominal hysterectomy    . Joint replacement      right and left basal joints of thumbs  . Bladder suspension    . Cataract extraction      bilateral  . Cervical neck ablation      x 7, C3-C6/3 screws and plate  . Squamous lesions removed      neck and face  . Basal cell carcinoma excision      face  . Panniculectomy    . Nasal polyp surgery      4 sinus surgeries  . Anterior fusion clivus-c2 extraoral w/ odontoid excision  8/14  . Proximal interphalangeal fusion (pip) Left 09/09/2013    Procedure: FUSION LEFT INDEX PROXIMAL INTERPHALANGEAL JOINT (PIP);  Surgeon: Cammie Sickle., MD;  Location:  [facility];  Service: Orthopedics;  Laterality: Left;  . Breast ductal system excision Right 01/15/2014    Procedure: EXCISION DUCTAL SYSTEM RIGHT BREAST;  Surgeon: Stark Klein, MD;  Location: [facility];  Service: General;  Laterality: Right;    There were no vitals filed for this visit.  Visit Diagnosis:  Abnormality of gait  Generalized weakness      Subjective Assessment - 11/09/14 1020    Subjective The patient reports she caught her L arm on door lever of camper and tore skin. Patient tried HEP and they went well.        THERAPEUTIC EXERCISE: Heel cord stretch standing at counter R/L x 2 reps Standing hip abduction in single limb stance with UE support x 10 reps R and L sides Seated ankle dorsiflexion x 10 reps   NEUROMUSCULAR RE-EDUCATION: Anterior and lateral lunges with return to midline with CGA x 5 reps to R and L sides Standing proactive balance doing small punches forward with minimal trunk movements Standing reaching from 90 degrees abduction to overhead reaching Rocker board standing  working on muscle control of board with anterior and posterior weight shifting Rocker board hip strategy with anterior targets for postural control Wall bumps with and without rocker board with cues for postural control  Gait: Attempted heel walking with CGA along countertop Gait with SPC x 230 ft with cues on arm swing and L heel strike Gait without device with CGA x 230 ft with cues on longer stride length        PT Education - 11/09/14 1110    Education provided Yes   Education Details Information on medical supply stores for single point cane with offset handle and tripod tip   Person(s) Educated Patient;Spouse   Methods Handout   Comprehension Verbalized understanding          PT Short Term Goals - 11/03/14 1251    PT SHORT TERM GOAL #1   Title The patient will be indep with HEP for LE strengthening, balance and gait.   Baseline Target date 12/02/2014   Time 4   Period Weeks   PT SHORT TERM GOAL #2   Title The patient will improve Berg from 32/56 up to 38/56 to demo decreasing risk for falls.   Baseline Target date 12/02/2014   Time 4   Period Weeks   PT SHORT TERM GOAL #3   Title The patient will improve gait speed from 1.89 ft/sec up to 2.1 ft/sec to demo improving mobility.   Baseline Target date 12/02/2014   Time 4   Period Weeks   PT SHORT TERM GOAL #4   Title The patient will negotiate 4 steps with reciproal pattern with bilateral handrails modified indep.   Baseline Target date 12/02/2014   Time 4   Period Weeks           PT Long Term Goals - 11/03/14 1301    PT LONG TERM GOAL #1   Title The patient will have gym routine for post d/c wellness with supervision from husband.   Baseline Target date 01/01/2015   Time 8   Period Weeks   PT LONG TERM GOAL #2   Title The patient will improve Berg from 32/56 up to 42/56 to demo decreasing risk for falls.   Baseline Target date 01/01/2015   Time 8   Period Weeks   PT LONG TERM GOAL #3   Title The patient will  improve gait speed from  1.89 ft/sec up to 2.4 ft/sec to demo improved mobility.   Baseline Target date 01/01/2015   Time 8   Period Weeks   PT LONG TERM GOAL #4   Title The patient will negotiate outdoor surfaces with rollater RW modified indep.   Baseline Target date 01/01/2015   Time 8   Period Weeks   PT LONG TERM GOAL #5   Title The patient will improve functional status survey results from 48% up to 60% for improved subjective report of mobility.   Baseline Target date 01/01/2015   Time 8   Period Weeks               Plan - 11/09/14 2131    Clinical Impression Statement The patient has decreased ankle DF and L foot slap noted during gait.  PT progressing balance and gait activities.   PT Next Visit Plan LE strengthening, balance, gait training, heel cord stretch, ankle DF   Consulted and Agree with Plan of Care Patient;Family member/caregiver   Family Member Consulted spouse        Problem List Patient Active Problem List   Diagnosis Date Noted  . Annual physical exam 08/07/2014  . Bloody discharge from nipple 12/29/2013  . Breast mass, right 12/11/2013  . At high risk for falls 12/11/2013  . Renal cysts, acquired, bilateral 05/04/2013  . Elevated LFTs 04/01/2013  . Cervical spinal stenosis 11/21/2012  . Nondiabetic gastroparesis 08/20/2012  . Unspecified hypothyroidism 08/08/2012  . Gastroparesis 05/06/2012  . Muscle cramp 01/19/2012  . Fatigue 01/14/2012  . Neurogenic bladder 11/05/2011  . Fecal incontinence 10/03/2011  . Osteoporosis 07/24/2011  . Adenoma of cecum 07/17/2011  . Chronic constipation 07/17/2011  . Skin lesion 07/08/2011  . Chronic neck pain 04/16/2011  . Rectocele 04/16/2011  . Hypersomnia 04/16/2011  . Vitamin D deficiency 04/16/2011  . Family history of colon cancer 04/16/2011  . Abnormal blood chemistry 04/16/2011  . Anemia   . COPD (chronic obstructive pulmonary disease) gold stage B   . Depression   . GERD (gastroesophageal reflux  disease)   . Hyperlipidemia   . Hypertension   . Allergic rhinitis   . Pulmonary embolism   . Skin cancer   . Seizures   . CIDP (chronic inflammatory demyelinating polyneuropathy)     Brissia Delisa, PT 11/09/2014, 9:35 PM  [facility] [address] [place], [place], [address] Phone: [phone]   Fax:  [phone]

## 2014-11-09 NOTE — Patient Instructions (Signed)
Medical supply stores: [facility] on [address] [phone]  [facility] on [address] or in [place] [phone] for both locations  [facility] on [address] [phone] 

## 2014-11-10 ENCOUNTER — Encounter: Payer: Self-pay | Admitting: Physical Therapy

## 2014-11-10 ENCOUNTER — Ambulatory Visit: Payer: Medicare Other | Admitting: Physical Therapy

## 2014-11-10 DIAGNOSIS — K219 Gastro-esophageal reflux disease without esophagitis: Secondary | ICD-10-CM | POA: Diagnosis not present

## 2014-11-10 DIAGNOSIS — R5383 Other fatigue: Secondary | ICD-10-CM | POA: Diagnosis not present

## 2014-11-10 DIAGNOSIS — R269 Unspecified abnormalities of gait and mobility: Secondary | ICD-10-CM

## 2014-11-10 DIAGNOSIS — E785 Hyperlipidemia, unspecified: Secondary | ICD-10-CM | POA: Diagnosis not present

## 2014-11-10 DIAGNOSIS — I10 Essential (primary) hypertension: Secondary | ICD-10-CM | POA: Diagnosis not present

## 2014-11-10 DIAGNOSIS — R531 Weakness: Secondary | ICD-10-CM | POA: Diagnosis not present

## 2014-11-10 NOTE — Therapy (Signed)
[facility] [address] [place], [place], [address] Phone: [phone]   Fax:  [phone]  Physical Therapy Treatment  Patient Details  Name: [name] MRN: [id number] Date of Birth: Sep 05, 1932 Referring Provider:  Colon Branch, MD  Encounter Date: 11/10/2014      PT End of Session - 11/10/14 1406    Visit Number 4   Number of Visits 16   Date for PT Re-Evaluation 12/31/14   PT Start Time 1402   PT Stop Time 1445   PT Time Calculation (min) 43 min   Equipment Utilized During Treatment Gait belt   Activity Tolerance Patient tolerated treatment well   Behavior During Therapy [facility] for tasks assessed/performed      Past Medical History  Diagnosis Date  . Anemia   . Asthma -COPD   . Chronic fatigue syndrome   . GERD (gastroesophageal reflux disease)   . Hyperlipidemia   . Hypertension   . Colon polyp   . Allergic rhinitis   . Vitamin D deficiency   . Pulmonary embolism     1980s  . Arthritis   . 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 Blood transfusion 1957  . Osteoporosis   . Hypothyroid   . Constipation   . Seizures     1990 last seizures on meds Phenobarb  . Skin cancer     squamous cell  . Neurogenic bladder 2013    husband caths pt  . chronic sinusitis 08/08/2012  . Shingles late 58's  . Cystocele 09/16/2012  . Tubular adenoma   . Breast mass     right breast in milk duct, bx neg  . Thyroid nodule     Right thyroid lobe, only seen on Sagittal imaging measures 2.3 cm in craniocaudal dimension and appears stable    Past Surgical History  Procedure Laterality Date  . Tonsillectomy and adenoidectomy    . Nasal septum surgery    . Dilation and curettage of uterus    . Tubal ligation    . Cesarean section    . Sinus surgeries      x 4  . Left ovary and tube removed    . Vocal polyps removed    . Appendectomy    . Cystocele repair    . Carpal tunnel release     right  . Shoulder arthroscopy      x2 left, 1 right  . Left finger fusion      3 fingers on left hand/one finger right  . Right median nerve decompression    . Duptyren's contracture right hand    . Finger ganglion cyst excision      right  . Abdominal hysterectomy    . Joint replacement      right and left basal joints of thumbs  . Bladder suspension    . Cataract extraction      bilateral  . Cervical neck ablation      x 7, C3-C6/3 screws and plate  . Squamous lesions removed      neck and face  . Basal cell carcinoma excision      face  . Panniculectomy    . Nasal polyp surgery      4 sinus surgeries  . Anterior fusion clivus-c2 extraoral w/ odontoid excision  8/14  . Proximal interphalangeal fusion (pip) Left 09/09/2013    Procedure: FUSION LEFT INDEX PROXIMAL INTERPHALANGEAL JOINT (PIP);  Surgeon: Cammie Sickle., MD;  Location:  [facility];  Service: Orthopedics;  Laterality: Left;  . Breast ductal system excision Right 01/15/2014    Procedure: EXCISION DUCTAL SYSTEM RIGHT BREAST;  Surgeon: Stark Klein, MD;  Location: [facility];  Service: General;  Laterality: Right;    There were no vitals filed for this visit.  Visit Diagnosis:  Abnormality of gait  Generalized weakness      Subjective Assessment - 11/10/14 1405    Subjective No new complaints. No falls or pain to report. Did purchase a straight cane with tripod tip.   Currently in Pain? No/denies      Treatment Gait 420 feet x 1 with cane with tripod tip with min guard assist. Cues on posture, equal step length and to use cane/not carry it. Increased assist for balance with turning and negotiating obstacles, up to min assist. 500 feet with cane/tripod tip on outdoor unlevel surfaces (grass, gravel and pavement), min guard assist to min assist for balance. Cues on increased hip/knee/DF with grass and gravel for increased foot clearance.  Exercise Seated DF against gravity, 5  sec hold x 10 reps, 2 sets, bil ankles together On bottom step of stairs with heels hanging off edge: lifting bil heels up and then lowering down below step level, x 10 reps, 2 sets Long sitting: DF with yellow band resistance x 10 reps bil ankles Seated edge of mat: assisted DF, resisted PF, emphasis on controlled return into DF with red band, X 10 reps bil ankles  Neuro re-ed: Heel walk forward x 4 laps seated with feet across blue foam beam: sit/stand 2 sets of 10 reps.  Standing with feet across blue foam beam: alternating fwd heel taps x 10 each side and alternating bwd toe taps x 10 each side.         PT Short Term Goals - 11/03/14 1251    PT SHORT TERM GOAL #1   Title The patient will be indep with HEP for LE strengthening, balance and gait.   Baseline Target date 12/02/2014   Time 4   Period Weeks   PT SHORT TERM GOAL #2   Title The patient will improve Berg from 32/56 up to 38/56 to demo decreasing risk for falls.   Baseline Target date 12/02/2014   Time 4   Period Weeks   PT SHORT TERM GOAL #3   Title The patient will improve gait speed from 1.89 ft/sec up to 2.1 ft/sec to demo improving mobility.   Baseline Target date 12/02/2014   Time 4   Period Weeks   PT SHORT TERM GOAL #4   Title The patient will negotiate 4 steps with reciproal pattern with bilateral handrails modified indep.   Baseline Target date 12/02/2014   Time 4   Period Weeks           PT Long Term Goals - 11/03/14 1301    PT LONG TERM GOAL #1   Title The patient will have gym routine for post d/c wellness with supervision from husband.   Baseline Target date 01/01/2015   Time 8   Period Weeks   PT LONG TERM GOAL #2   Title The patient will improve Berg from 32/56 up to 42/56 to demo decreasing risk for falls.   Baseline Target date 01/01/2015   Time 8   Period Weeks   PT LONG TERM GOAL #3   Title The patient will improve gait speed from 1.89 ft/sec up to 2.4 ft/sec to demo improved mobility.  Baseline Target date 01/01/2015   Time 8   Period Weeks   PT LONG TERM GOAL #4   Title The patient will negotiate outdoor surfaces with rollater RW modified indep.   Baseline Target date 01/01/2015   Time 8   Period Weeks   PT LONG TERM GOAL #5   Title The patient will improve functional status survey results from 48% up to 60% for improved subjective report of mobility.   Baseline Target date 01/01/2015   Time 8   Period Weeks            Plan - 11/10/14 1406    Clinical Impression Statement Pt challenged by exercises and balance activites today. Pt making steady progress toward goals.    Pt will benefit from skilled therapeutic intervention in order to improve on the following deficits Abnormal gait;Decreased balance;Impaired sensation;Postural dysfunction;Decreased mobility;Decreased coordination;Decreased strength;Decreased endurance;Difficulty walking   Rehab Potential Good   Clinical Impairments Affecting Rehab Potential significant neuropathy in LEs   PT Frequency 2x / week   PT Duration 8 weeks   PT Treatment/Interventions Gait training;ADLs/Self Care Home Management;Neuromuscular re-education;Balance training;Therapeutic exercise;Therapeutic activities;Functional mobility training;Stair training;Patient/family education   PT Next Visit Plan LE strengthening, balance, gait training, heel cord stretch, ankle DF   Consulted and Agree with Plan of Care Patient;Family member/caregiver   Family Member Consulted spouse        Problem List Patient Active Problem List   Diagnosis Date Noted  . Annual physical exam 08/07/2014  . Bloody discharge from nipple 12/29/2013  . Breast mass, right 12/11/2013  . At high risk for falls 12/11/2013  . Renal cysts, acquired, bilateral 05/04/2013  . Elevated LFTs 04/01/2013  . Cervical spinal stenosis 11/21/2012  . Nondiabetic gastroparesis 08/20/2012  . Unspecified hypothyroidism 08/08/2012  . Gastroparesis 05/06/2012  . Muscle cramp  01/19/2012  . Fatigue 01/14/2012  . Neurogenic bladder 11/05/2011  . Fecal incontinence 10/03/2011  . Osteoporosis 07/24/2011  . Adenoma of cecum 07/17/2011  . Chronic constipation 07/17/2011  . Skin lesion 07/08/2011  . Chronic neck pain 04/16/2011  . Rectocele 04/16/2011  . Hypersomnia 04/16/2011  . Vitamin D deficiency 04/16/2011  . Family history of colon cancer 04/16/2011  . Abnormal blood chemistry 04/16/2011  . Anemia   . COPD (chronic obstructive pulmonary disease) gold stage B   . Depression   . GERD (gastroesophageal reflux disease)   . Hyperlipidemia   . Hypertension   . Allergic rhinitis   . Pulmonary embolism   . Skin cancer   . Seizures   . CIDP (chronic inflammatory demyelinating polyneuropathy)     Willow Ora 11/11/2014, 7:49 PM  Willow Ora, PTA, [facility] [address] [place] [employer], [place] [address] [phone] 11/11/2014, 7:49 PM

## 2014-11-11 DIAGNOSIS — H04123 Dry eye syndrome of bilateral lacrimal glands: Secondary | ICD-10-CM | POA: Diagnosis not present

## 2014-11-11 DIAGNOSIS — H10413 Chronic giant papillary conjunctivitis, bilateral: Secondary | ICD-10-CM | POA: Diagnosis not present

## 2014-11-16 ENCOUNTER — Encounter: Payer: Self-pay | Admitting: Physical Therapy

## 2014-11-16 ENCOUNTER — Ambulatory Visit: Payer: Medicare Other | Admitting: Physical Therapy

## 2014-11-16 DIAGNOSIS — K219 Gastro-esophageal reflux disease without esophagitis: Secondary | ICD-10-CM | POA: Diagnosis not present

## 2014-11-16 DIAGNOSIS — R269 Unspecified abnormalities of gait and mobility: Secondary | ICD-10-CM | POA: Diagnosis not present

## 2014-11-16 DIAGNOSIS — R531 Weakness: Secondary | ICD-10-CM | POA: Diagnosis not present

## 2014-11-16 DIAGNOSIS — I10 Essential (primary) hypertension: Secondary | ICD-10-CM | POA: Diagnosis not present

## 2014-11-16 DIAGNOSIS — R5383 Other fatigue: Secondary | ICD-10-CM | POA: Diagnosis not present

## 2014-11-16 DIAGNOSIS — E785 Hyperlipidemia, unspecified: Secondary | ICD-10-CM | POA: Diagnosis not present

## 2014-11-16 NOTE — Therapy (Signed)
[facility] [address] [place], [place], [address] Phone: [phone]   Fax:  [phone]  Physical Therapy Treatment  Patient Details  Name: [name] MRN: [id number] Date of Birth: 02-24-1933 Referring Provider:  Colon Branch, MD  Encounter Date: 11/16/2014      PT End of Session - 11/16/14 0849    Visit Number 5   Number of Visits 16   Date for PT Re-Evaluation 12/31/14   PT Start Time 0845   PT Stop Time 0930   PT Time Calculation (min) 45 min   Equipment Utilized During Treatment Gait belt   Activity Tolerance Patient tolerated treatment well   Behavior During Therapy [facility] for tasks assessed/performed      Past Medical History  Diagnosis Date  . Anemia   . Asthma -COPD   . Chronic fatigue syndrome   . GERD (gastroesophageal reflux disease)   . Hyperlipidemia   . Hypertension   . Colon polyp   . Allergic rhinitis   . Vitamin D deficiency   . Pulmonary embolism     1980s  . Arthritis   . 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 Blood transfusion 1957  . Osteoporosis   . Hypothyroid   . Constipation   . Seizures     1990 last seizures on meds Phenobarb  . Skin cancer     squamous cell  . Neurogenic bladder 2013    husband caths pt  . chronic sinusitis 08/08/2012  . Shingles late 76's  . Cystocele 09/16/2012  . Tubular adenoma   . Breast mass     right breast in milk duct, bx neg  . Thyroid nodule     Right thyroid lobe, only seen on Sagittal imaging measures 2.3 cm in craniocaudal dimension and appears stable    Past Surgical History  Procedure Laterality Date  . Tonsillectomy and adenoidectomy    . Nasal septum surgery    . Dilation and curettage of uterus    . Tubal ligation    . Cesarean section    . Sinus surgeries      x 4  . Left ovary and tube removed    . Vocal polyps removed    . Appendectomy    . Cystocele repair    . Carpal tunnel release     right  . Shoulder arthroscopy      x2 left, 1 right  . Left finger fusion      3 fingers on left hand/one finger right  . Right median nerve decompression    . Duptyren's contracture right hand    . Finger ganglion cyst excision      right  . Abdominal hysterectomy    . Joint replacement      right and left basal joints of thumbs  . Bladder suspension    . Cataract extraction      bilateral  . Cervical neck ablation      x 7, C3-C6/3 screws and plate  . Squamous lesions removed      neck and face  . Basal cell carcinoma excision      face  . Panniculectomy    . Nasal polyp surgery      4 sinus surgeries  . Anterior fusion clivus-c2 extraoral w/ odontoid excision  8/14  . Proximal interphalangeal fusion (pip) Left 09/09/2013    Procedure: FUSION LEFT INDEX PROXIMAL INTERPHALANGEAL JOINT (PIP);  Surgeon: Cammie Sickle., MD;  Location:  [facility];  Service: Orthopedics;  Laterality: Left;  . Breast ductal system excision Right 01/15/2014    Procedure: EXCISION DUCTAL SYSTEM RIGHT BREAST;  Surgeon: Stark Klein, MD;  Location: [facility];  Service: General;  Laterality: Right;    There were no vitals filed for this visit.  Visit Diagnosis:  Abnormality of gait  Generalized weakness      Subjective Assessment - 11/16/14 0848    Subjective No new complaints. No falls or pain to report. Feels she is walking better with use of cane with tripod tip.   Currently in Pain? No/denies            [facility] Adult PT Treatment/Exercise - 11/16/14 0001    Ambulation/Gait   Ambulation/Gait Yes   Ambulation/Gait Assistance 5: Supervision   Ambulation/Gait Assistance Details --   Ambulation Distance (Feet) 1000 Feet  or more   Assistive device Straight cane  tripod tip   Gait Pattern Step-through pattern;Decreased stride length;Narrow base of support   Ambulation Surface Level;Unlevel;Indoor;Outdoor;Paved;Gravel;Grass   Dynamic Standing Balance    Dynamic Standing - Balance Support No upper extremity supported;During functional activity   Dynamic Standing - Level of Assistance 4: Min assist;3: Mod assist   Dynamic Standing - Balance Activities Compliant surfaces;Rocker board;Eyes open;Eyes closed;Head turns;Head nods   Dynamic Standing - Comments aire ex in corner: feet apart: eyes closed hold steady, eyes closed head movements up/down and left right; feet togther: eyes closed no head movements, eyes open head nods/shakes; balance board: both ways on board- eyes closed hold steady, eyes closed head movements up/down and left/right. seated with feet on air ex: sit/stands with no UE assist 2 sets of 10 reps.                                    PT Short Term Goals - 11/03/14 1251    PT SHORT TERM GOAL #1   Title The patient will be indep with HEP for LE strengthening, balance and gait.   Baseline Target date 12/02/2014   Time 4   Period Weeks   PT SHORT TERM GOAL #2   Title The patient will improve Berg from 32/56 up to 38/56 to demo decreasing risk for falls.   Baseline Target date 12/02/2014   Time 4   Period Weeks   PT SHORT TERM GOAL #3   Title The patient will improve gait speed from 1.89 ft/sec up to 2.1 ft/sec to demo improving mobility.   Baseline Target date 12/02/2014   Time 4   Period Weeks   PT SHORT TERM GOAL #4   Title The patient will negotiate 4 steps with reciproal pattern with bilateral handrails modified indep.   Baseline Target date 12/02/2014   Time 4   Period Weeks           PT Long Term Goals - 11/03/14 1301    PT LONG TERM GOAL #1   Title The patient will have gym routine for post d/c wellness with supervision from husband.   Baseline Target date 01/01/2015   Time 8   Period Weeks   PT LONG TERM GOAL #2   Title The patient will improve Berg from 32/56 up to 42/56 to demo decreasing risk for falls.   Baseline Target date 01/01/2015   Time 8   Period Weeks   PT LONG TERM GOAL #3   Title The patient  will  improve gait speed from 1.89 ft/sec up to 2.4 ft/sec to demo improved mobility.   Baseline Target date 01/01/2015   Time 8   Period Weeks   PT LONG TERM GOAL #4   Title The patient will negotiate outdoor surfaces with rollater RW modified indep.   Baseline Target date 01/01/2015   Time 8   Period Weeks   PT LONG TERM GOAL #5   Title The patient will improve functional status survey results from 48% up to 60% for improved subjective report of mobility.   Baseline Target date 01/01/2015   Time 8   Period Weeks           Plan - 11/16/14 0850    Clinical Impression Statement Pt with significant posterior preference, especially on complaint surfaces and with sit<>stand transfers if not using hands. Needs cues and facilitation to realize she is leaning backawards and to correct posture/weight shift foreward.  Appears steady with cane on level and paved outdoor surfaces. Pt is making progress toward goals   Pt will benefit from skilled therapeutic intervention in order to improve on the following deficits Abnormal gait;Decreased balance;Impaired sensation;Postural dysfunction;Decreased mobility;Decreased coordination;Decreased strength;Decreased endurance;Difficulty walking   Rehab Potential Good   Clinical Impairments Affecting Rehab Potential significant neuropathy in LEs   PT Frequency 2x / week   PT Duration 8 weeks   PT Treatment/Interventions Gait training;ADLs/Self Care Home Management;Neuromuscular re-education;Balance training;Therapeutic exercise;Therapeutic activities;Functional mobility training;Stair training;Patient/family education   PT Next Visit Plan LE strengthening, balance, gait training, heel cord stretch, ankle DF   Consulted and Agree with Plan of Care Patient;Family member/caregiver   Family Member Consulted spouse        Problem List Patient Active Problem List   Diagnosis Date Noted  . Annual physical exam 08/07/2014  . Bloody discharge from nipple 12/29/2013  .  Breast mass, right 12/11/2013  . At high risk for falls 12/11/2013  . Renal cysts, acquired, bilateral 05/04/2013  . Elevated LFTs 04/01/2013  . Cervical spinal stenosis 11/21/2012  . Nondiabetic gastroparesis 08/20/2012  . Unspecified hypothyroidism 08/08/2012  . Gastroparesis 05/06/2012  . Muscle cramp 01/19/2012  . Fatigue 01/14/2012  . Neurogenic bladder 11/05/2011  . Fecal incontinence 10/03/2011  . Osteoporosis 07/24/2011  . Adenoma of cecum 07/17/2011  . Chronic constipation 07/17/2011  . Skin lesion 07/08/2011  . Chronic neck pain 04/16/2011  . Rectocele 04/16/2011  . Hypersomnia 04/16/2011  . Vitamin D deficiency 04/16/2011  . Family history of colon cancer 04/16/2011  . Abnormal blood chemistry 04/16/2011  . Anemia   . COPD (chronic obstructive pulmonary disease) gold stage B   . Depression   . GERD (gastroesophageal reflux disease)   . Hyperlipidemia   . Hypertension   . Allergic rhinitis   . Pulmonary embolism   . Skin cancer   . Seizures   . CIDP (chronic inflammatory demyelinating polyneuropathy)     Willow Ora 11/16/2014, 1:30 PM  Willow Ora, PTA, [facility] [address], [place], [place] [address] [phone] 11/16/2014, 1:30 PM

## 2014-11-18 ENCOUNTER — Encounter: Payer: Self-pay | Admitting: Internal Medicine

## 2014-11-19 ENCOUNTER — Telehealth: Payer: Self-pay

## 2014-11-19 ENCOUNTER — Ambulatory Visit
Admission: RE | Admit: 2014-11-19 | Discharge: 2014-11-19 | Disposition: A | Discharge status: Home / Self Care | Payer: Medicare Other | Source: Ambulatory Visit | Attending: Internal Medicine | Admitting: Internal Medicine

## 2014-11-19 ENCOUNTER — Ambulatory Visit: Payer: Medicare Other | Admitting: Physical Therapy

## 2014-11-19 ENCOUNTER — Encounter: Payer: Self-pay | Admitting: Physical Therapy

## 2014-11-19 ENCOUNTER — Other Ambulatory Visit (HOSPITAL_COMMUNITY)
Admission: RE | Admit: 2014-11-19 | Discharge: 2014-11-19 | Disposition: A | Discharge status: Home / Self Care | Payer: Medicare Other | Source: Ambulatory Visit | Attending: Interventional Radiology | Admitting: Interventional Radiology

## 2014-11-19 DIAGNOSIS — I10 Essential (primary) hypertension: Secondary | ICD-10-CM | POA: Diagnosis not present

## 2014-11-19 DIAGNOSIS — R5383 Other fatigue: Secondary | ICD-10-CM | POA: Diagnosis not present

## 2014-11-19 DIAGNOSIS — R269 Unspecified abnormalities of gait and mobility: Secondary | ICD-10-CM | POA: Diagnosis not present

## 2014-11-19 DIAGNOSIS — K219 Gastro-esophageal reflux disease without esophagitis: Secondary | ICD-10-CM | POA: Diagnosis not present

## 2014-11-19 DIAGNOSIS — E042 Nontoxic multinodular goiter: Secondary | ICD-10-CM | POA: Diagnosis not present

## 2014-11-19 DIAGNOSIS — E041 Nontoxic single thyroid nodule: Secondary | ICD-10-CM | POA: Diagnosis present

## 2014-11-19 DIAGNOSIS — R531 Weakness: Secondary | ICD-10-CM | POA: Diagnosis not present

## 2014-11-19 DIAGNOSIS — E785 Hyperlipidemia, unspecified: Secondary | ICD-10-CM | POA: Diagnosis not present

## 2014-11-19 MED ORDER — DEXTROAMPHETAMINE SULFATE 10 MG PO TABS: 10.0000 mg | ORAL_TABLET | Freq: Four times a day (QID) | ORAL | Status: DC

## 2014-11-19 NOTE — Telephone Encounter (Signed)
-----   Message from Colon Branch, MD sent at 11/19/2014  1:23 PM EDT ----- Send a new prescription for dextromethamphetamine, change the sig to  4 times a day #120 no refills

## 2014-11-19 NOTE — Therapy (Signed)
[facility] [address] [place], [place], [address] Phone: [phone]   Fax:  [phone]  Physical Therapy Treatment  Patient Details  Name: [name] MRN: [id number] Date of Birth: 1933-05-12 Referring Provider:  Colon Branch, MD  Encounter Date: 11/19/2014      PT End of Session - 11/19/14 0849    Visit Number 6   Number of Visits 16   Date for PT Re-Evaluation 12/31/14   PT Start Time 0845   PT Stop Time 0930   PT Time Calculation (min) 45 min   Equipment Utilized During Treatment Gait belt   Activity Tolerance Patient tolerated treatment well   Behavior During Therapy [facility] for tasks assessed/performed      Past Medical History  Diagnosis Date  . Anemia   . Asthma -COPD   . Chronic fatigue syndrome   . GERD (gastroesophageal reflux disease)   . Hyperlipidemia   . Hypertension   . Colon polyp   . Allergic rhinitis   . Vitamin D deficiency   . Pulmonary embolism     1980s  . Arthritis   . 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 Blood transfusion 1957  . Osteoporosis   . Hypothyroid   . Constipation   . Seizures     1990 last seizures on meds Phenobarb  . Skin cancer     squamous cell  . Neurogenic bladder 2013    husband caths pt  . chronic sinusitis 08/08/2012  . Shingles late 47's  . Cystocele 09/16/2012  . Tubular adenoma   . Breast mass     right breast in milk duct, bx neg  . Thyroid nodule     Right thyroid lobe, only seen on Sagittal imaging measures 2.3 cm in craniocaudal dimension and appears stable    Past Surgical History  Procedure Laterality Date  . Tonsillectomy and adenoidectomy    . Nasal septum surgery    . Dilation and curettage of uterus    . Tubal ligation    . Cesarean section    . Sinus surgeries      x 4  . Left ovary and tube removed    . Vocal polyps removed    . Appendectomy    . Cystocele repair    . Carpal tunnel release       right  . Shoulder arthroscopy      x2 left, 1 right  . Left finger fusion      3 fingers on left hand/one finger right  . Right median nerve decompression    . Duptyren's contracture right hand    . Finger ganglion cyst excision      right  . Abdominal hysterectomy    . Joint replacement      right and left basal joints of thumbs  . Bladder suspension    . Cataract extraction      bilateral  . Cervical neck ablation      x 7, C3-C6/3 screws and plate  . Squamous lesions removed      neck and face  . Basal cell carcinoma excision      face  . Panniculectomy    . Nasal polyp surgery      4 sinus surgeries  . Anterior fusion clivus-c2 extraoral w/ odontoid excision  8/14  . Proximal interphalangeal fusion (pip) Left 09/09/2013    Procedure: FUSION LEFT INDEX PROXIMAL INTERPHALANGEAL JOINT (PIP);  Surgeon: Cammie Sickle., MD;  Location: [facility];  Service: Orthopedics;  Laterality: Left;  . Breast ductal system excision Right 01/15/2014    Procedure: EXCISION DUCTAL SYSTEM RIGHT BREAST;  Surgeon: Stark Klein, MD;  Location: [facility];  Service: General;  Laterality: Right;    There were no vitals filed for this visit.  Visit Diagnosis:  Abnormality of gait  Generalized weakness      Subjective Assessment - 11/19/14 0848    Subjective No new complaints. No falls or pain to report. Going for biopsy today due to thyroid nodules.    Currently in Pain? No/denies          [facility] Adult PT Treatment/Exercise - 11/19/14 0850    Ambulation/Gait   Ambulation/Gait Yes   Ambulation/Gait Assistance 5: Supervision;4: Min guard   Ambulation/Gait Assistance Details increased assist on complaint surfaces outside with minor loss of balance   Ambulation Distance (Feet) 375 Feet   Assistive device Straight cane  tripod tip   Gait Pattern Step-through pattern;Decreased stride length;Narrow base of support   Ambulation Surface  Level;Unlevel;Indoor;Outdoor;Gravel;Grass   Ramp Other (comment)  min guard assist with cane   Curb Other (comment)  min guard assist with cane   Dynamic Standing Balance   Dynamic Standing - Balance Support No upper extremity supported;During functional activity   Dynamic Standing - Level of Assistance 4: Min assist   Dynamic Standing - Balance Activities Compliant surfaces;Eyes open;Eyes closed;Head turns;Head nods   Dynamic Standing - Comments standing on air ex in corner: with eyes open, then with eyes closed, head nods/shakes.     ramp :performed both facing up/down with wide base of support. Min to mod assist for balance with cues on posture and weight shift. Eyes open- static hold no head movements, hold with head nods/shakes, progressed to eyes closed with no head movements to head nods/shakes Stepping strategies: fwd step and back, alternating feet x 10 each, bwd step and back, alternating feet x 10 each  Sit<>stand with blue foam beam Feet across blue foam beam, x 10 reps with up to min assist for balance and cues on foot position to decreased retropulsion with standing        PT Short Term Goals - 11/03/14 1251    PT SHORT TERM GOAL #1   Title The patient will be indep with HEP for LE strengthening, balance and gait.   Baseline Target date 12/02/2014   Time 4   Period Weeks   PT SHORT TERM GOAL #2   Title The patient will improve Berg from 32/56 up to 38/56 to demo decreasing risk for falls.   Baseline Target date 12/02/2014   Time 4   Period Weeks   PT SHORT TERM GOAL #3   Title The patient will improve gait speed from 1.89 ft/sec up to 2.1 ft/sec to demo improving mobility.   Baseline Target date 12/02/2014   Time 4   Period Weeks   PT SHORT TERM GOAL #4   Title The patient will negotiate 4 steps with reciproal pattern with bilateral handrails modified indep.   Baseline Target date 12/02/2014   Time 4   Period Weeks           PT Long Term Goals - 11/03/14 1301     PT LONG TERM GOAL #1   Title The patient will have gym routine for post d/c wellness with supervision from husband.   Baseline Target date 01/01/2015   Time 8   Period Weeks  PT LONG TERM GOAL #2   Title The patient will improve Berg from 32/56 up to 42/56 to demo decreasing risk for falls.   Baseline Target date 01/01/2015   Time 8   Period Weeks   PT LONG TERM GOAL #3   Title The patient will improve gait speed from 1.89 ft/sec up to 2.4 ft/sec to demo improved mobility.   Baseline Target date 01/01/2015   Time 8   Period Weeks   PT LONG TERM GOAL #4   Title The patient will negotiate outdoor surfaces with rollater RW modified indep.   Baseline Target date 01/01/2015   Time 8   Period Weeks   PT LONG TERM GOAL #5   Title The patient will improve functional status survey results from 48% up to 60% for improved subjective report of mobility.   Baseline Target date 01/01/2015   Time 8   Period Weeks           Plan - 11/19/14 0849    Clinical Impression Statement Pt continues to demo posterior lean/balance loss preference with balance activities. Making steady progress toward her goals.   Pt will benefit from skilled therapeutic intervention in order to improve on the following deficits Abnormal gait;Decreased balance;Impaired sensation;Postural dysfunction;Decreased mobility;Decreased coordination;Decreased strength;Decreased endurance;Difficulty walking   Rehab Potential Good   Clinical Impairments Affecting Rehab Potential significant neuropathy in LEs   PT Frequency 2x / week   PT Duration 8 weeks   PT Treatment/Interventions Gait training;ADLs/Self Care Home Management;Neuromuscular re-education;Balance training;Therapeutic exercise;Therapeutic activities;Functional mobility training;Stair training;Patient/family education   PT Next Visit Plan LE strengthening, balance, gait training, heel cord stretch, ankle DF   Consulted and Agree with Plan of Care Patient;Family  member/caregiver   Family Member Consulted spouse        Problem List Patient Active Problem List   Diagnosis Date Noted  . Annual physical exam 08/07/2014  . Bloody discharge from nipple 12/29/2013  . Breast mass, right 12/11/2013  . At high risk for falls 12/11/2013  . Renal cysts, acquired, bilateral 05/04/2013  . Elevated LFTs 04/01/2013  . Cervical spinal stenosis 11/21/2012  . Nondiabetic gastroparesis 08/20/2012  . Unspecified hypothyroidism 08/08/2012  . Gastroparesis 05/06/2012  . Muscle cramp 01/19/2012  . Fatigue 01/14/2012  . Neurogenic bladder 11/05/2011  . Fecal incontinence 10/03/2011  . Osteoporosis 07/24/2011  . Adenoma of cecum 07/17/2011  . Chronic constipation 07/17/2011  . Skin lesion 07/08/2011  . Chronic neck pain 04/16/2011  . Rectocele 04/16/2011  . Hypersomnia 04/16/2011  . Vitamin D deficiency 04/16/2011  . Family history of colon cancer 04/16/2011  . Abnormal blood chemistry 04/16/2011  . Anemia   . COPD (chronic obstructive pulmonary disease) gold stage B   . Depression   . GERD (gastroesophageal reflux disease)   . Hyperlipidemia   . Hypertension   . Allergic rhinitis   . Pulmonary embolism   . Skin cancer   . Seizures   . CIDP (chronic inflammatory demyelinating polyneuropathy)     Willow Ora 11/20/2014, 12:46 PM  Willow Ora, PTA, [facility] [address], [place], [place] [address] [phone] 11/20/2014, 12:47 PM

## 2014-11-19 NOTE — Telephone Encounter (Signed)
dextro methamphetamine 10 mg changed to 4 times daily, rx printed, awaiting MD signature. Rx placed at front desk for Pt to pick up at her convenience.

## 2014-11-23 ENCOUNTER — Encounter: Payer: Self-pay | Admitting: Internal Medicine

## 2014-11-23 DIAGNOSIS — L600 Ingrowing nail: Secondary | ICD-10-CM | POA: Diagnosis not present

## 2014-11-24 ENCOUNTER — Ambulatory Visit: Payer: Self-pay

## 2014-11-26 ENCOUNTER — Ambulatory Visit: Payer: Medicare Other | Admitting: Rehabilitative and Restorative Service Providers"

## 2014-11-26 DIAGNOSIS — K219 Gastro-esophageal reflux disease without esophagitis: Secondary | ICD-10-CM | POA: Diagnosis not present

## 2014-11-26 DIAGNOSIS — E785 Hyperlipidemia, unspecified: Secondary | ICD-10-CM | POA: Diagnosis not present

## 2014-11-26 DIAGNOSIS — R269 Unspecified abnormalities of gait and mobility: Secondary | ICD-10-CM | POA: Diagnosis not present

## 2014-11-26 DIAGNOSIS — R531 Weakness: Secondary | ICD-10-CM

## 2014-11-26 DIAGNOSIS — R5383 Other fatigue: Secondary | ICD-10-CM | POA: Diagnosis not present

## 2014-11-26 DIAGNOSIS — I10 Essential (primary) hypertension: Secondary | ICD-10-CM | POA: Diagnosis not present

## 2014-11-26 NOTE — Therapy (Signed)
[facility] [address] [place], [place], [address] Phone: [phone]   Fax:  [phone]  Physical Therapy Treatment  Patient Details  Name: [name] MRN: [id number] Date of Birth: Mar 13, 1933 Referring Provider:  Colon Branch, MD  Encounter Date: 11/26/2014      PT End of Session - 11/26/14 1053    Visit Number 7   Number of Visits 16   Date for PT Re-Evaluation 12/31/14   PT Start Time 0934   PT Stop Time 1016   PT Time Calculation (min) 42 min   Equipment Utilized During Treatment Gait belt   Activity Tolerance Patient tolerated treatment well   Behavior During Therapy [facility] for tasks assessed/performed      Past Medical History  Diagnosis Date  . Anemia   . Asthma -COPD   . Chronic fatigue syndrome   . GERD (gastroesophageal reflux disease)   . Hyperlipidemia   . Hypertension   . Colon polyp   . Allergic rhinitis   . Vitamin D deficiency   . Pulmonary embolism     1980s  . Arthritis   . 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 Blood transfusion 1957  . Osteoporosis   . Hypothyroid   . Constipation   . Seizures     1990 last seizures on meds Phenobarb  . Skin cancer     squamous cell  . Neurogenic bladder 2013    husband caths pt  . chronic sinusitis 08/08/2012  . Shingles late 40's  . Cystocele 09/16/2012  . Tubular adenoma   . Breast mass     right breast in milk duct, bx neg  . Thyroid nodule     Right thyroid lobe, only seen on Sagittal imaging measures 2.3 cm in craniocaudal dimension and appears stable    Past Surgical History  Procedure Laterality Date  . Tonsillectomy and adenoidectomy    . Nasal septum surgery    . Dilation and curettage of uterus    . Tubal ligation    . Cesarean section    . Sinus surgeries      x 4  . Left ovary and tube removed    . Vocal polyps removed    . Appendectomy    . Cystocele repair    . Carpal tunnel release     right  . Shoulder arthroscopy      x2 left, 1 right  . Left finger fusion      3 fingers on left hand/one finger right  . Right median nerve decompression    . Duptyren's contracture right hand    . Finger ganglion cyst excision      right  . Abdominal hysterectomy    . Joint replacement      right and left basal joints of thumbs  . Bladder suspension    . Cataract extraction      bilateral  . Cervical neck ablation      x 7, C3-C6/3 screws and plate  . Squamous lesions removed      neck and face  . Basal cell carcinoma excision      face  . Panniculectomy    . Nasal polyp surgery      4 sinus surgeries  . Anterior fusion clivus-c2 extraoral w/ odontoid excision  8/14  . Proximal interphalangeal fusion (pip) Left 09/09/2013    Procedure: FUSION LEFT INDEX PROXIMAL INTERPHALANGEAL JOINT (PIP);  Surgeon: Cammie Sickle., MD;  Location:  [facility];  Service: Orthopedics;  Laterality: Left;  . Breast ductal system excision Right 01/15/2014    Procedure: EXCISION DUCTAL SYSTEM RIGHT BREAST;  Surgeon: Stark Klein, MD;  Location: [facility];  Service: General;  Laterality: Right;    There were no vitals filed for this visit.  Visit Diagnosis:  Abnormality of gait  Generalized weakness      Subjective Assessment - 11/26/14 0936    Subjective The patient reports no new falls.  She heard back from biopsy and negative for cancer.  She is doing HEP.   She cancelled earlier session this week due to ingrown toenail procedure.   Patient Stated Goals Improve gait and balance; husband reports provide HEP and improve steps   Currently in Pain? No/denies       Gait: Gait with SPC x 230 ft x 2 reps with cues on bilateral heel strike and upper back positioning (shoulder blades back and down) with CGA for safety Gait without device with CGA to occasional min A with cues for posture and arm swing with verbal cues for visual spotting to improve balance x 100  ft x 2 reps Stair negotiation with step to and reciprocal pattern with SBA for safety x 4 steps with one handrail  NEUROMUSCULAR RE-EDUCATION: Rockerboard with ankle control adding resistance manually Rockerbaord with hip strategy posterior to anterior control with CGA to min A Rockerboard with hip bumps to countertop with min A  Physioball seated with balance reaction strategies with  Min A, marching, LE extension, shoulder circles and scapular retraction for posture re-ed Standing marching with ankle DF and standing DF with rocking through heels with UE support and CGA       PT Short Term Goals - 11/03/14 1251    PT SHORT TERM GOAL #1   Title The patient will be indep with HEP for LE strengthening, balance and gait.   Baseline Target date 12/02/2014   Time 4   Period Weeks   PT SHORT TERM GOAL #2   Title The patient will improve Berg from 32/56 up to 38/56 to demo decreasing risk for falls.   Baseline Target date 12/02/2014   Time 4   Period Weeks   PT SHORT TERM GOAL #3   Title The patient will improve gait speed from 1.89 ft/sec up to 2.1 ft/sec to demo improving mobility.   Baseline Target date 12/02/2014   Time 4   Period Weeks   PT SHORT TERM GOAL #4   Title The patient will negotiate 4 steps with reciproal pattern with bilateral handrails modified indep.   Baseline Target date 12/02/2014   Time 4   Period Weeks           PT Long Term Goals - 11/03/14 1301    PT LONG TERM GOAL #1   Title The patient will have gym routine for post d/c wellness with supervision from husband.   Baseline Target date 01/01/2015   Time 8   Period Weeks   PT LONG TERM GOAL #2   Title The patient will improve Berg from 32/56 up to 42/56 to demo decreasing risk for falls.   Baseline Target date 01/01/2015   Time 8   Period Weeks   PT LONG TERM GOAL #3   Title The patient will improve gait speed from 1.89 ft/sec up to 2.4 ft/sec to demo improved mobility.   Baseline Target date 01/01/2015   Time  8   Period Weeks  PT LONG TERM GOAL #4   Title The patient will negotiate outdoor surfaces with rollater RW modified indep.   Baseline Target date 01/01/2015   Time 8   Period Weeks   PT LONG TERM GOAL #5   Title The patient will improve functional status survey results from 48% up to 60% for improved subjective report of mobility.   Baseline Target date 01/01/2015   Time 8   Period Weeks               Plan - 11/26/14 1054    Clinical Impression Statement PT focusing on balance recovery from posterior direction with physioball sitting and posterior perturbations as well as standing rocker board posterior >anterior balance control.   PT Next Visit Plan Ant/posterior dynamic balance activities, core and posture during standing tasks   Consulted and Agree with Plan of Care Patient;Family member/caregiver   Family Member Consulted spouse        Problem List Patient Active Problem List   Diagnosis Date Noted  . Annual physical exam 08/07/2014  . Bloody discharge from nipple 12/29/2013  . Breast mass, right 12/11/2013  . At high risk for falls 12/11/2013  . Renal cysts, acquired, bilateral 05/04/2013  . Elevated LFTs 04/01/2013  . Cervical spinal stenosis 11/21/2012  . Nondiabetic gastroparesis 08/20/2012  . Unspecified hypothyroidism 08/08/2012  . Gastroparesis 05/06/2012  . Muscle cramp 01/19/2012  . Fatigue 01/14/2012  . Neurogenic bladder 11/05/2011  . Fecal incontinence 10/03/2011  . Osteoporosis 07/24/2011  . Adenoma of cecum 07/17/2011  . Chronic constipation 07/17/2011  . Skin lesion 07/08/2011  . Chronic neck pain 04/16/2011  . Rectocele 04/16/2011  . Hypersomnia 04/16/2011  . Vitamin D deficiency 04/16/2011  . Family history of colon cancer 04/16/2011  . Abnormal blood chemistry 04/16/2011  . Anemia   . COPD (chronic obstructive pulmonary disease) gold stage B   . Depression   . GERD (gastroesophageal reflux disease)   . Hyperlipidemia   . Hypertension    . Allergic rhinitis   . Pulmonary embolism   . Skin cancer   . Seizures   . CIDP (chronic inflammatory demyelinating polyneuropathy)     Corky Blumstein, PT 11/26/2014, 10:56 AM  [facility] [address] [place], [place], [address] Phone: [phone]   Fax:  [phone]

## 2014-12-03 ENCOUNTER — Encounter: Payer: Self-pay | Admitting: Internal Medicine

## 2014-12-03 ENCOUNTER — Ambulatory Visit: Payer: Medicare Other | Attending: Physician Assistant | Admitting: Rehabilitative and Restorative Service Providers"

## 2014-12-03 DIAGNOSIS — R269 Unspecified abnormalities of gait and mobility: Secondary | ICD-10-CM | POA: Diagnosis not present

## 2014-12-03 DIAGNOSIS — R531 Weakness: Secondary | ICD-10-CM | POA: Diagnosis not present

## 2014-12-03 NOTE — Therapy (Signed)
[facility] [address] [place], [place], [address] Phone: [phone]   Fax:  [phone]  Physical Therapy Treatment  Patient Details  Name: [name] MRN: [id number] Date of Birth: 10-22-1932 Referring Provider:  Colon Branch, MD  Encounter Date: 12/03/2014      PT End of Session - 12/03/14 1434    Visit Number 7   Number of Visits 16   Date for PT Re-Evaluation 12/31/14   PT Start Time 6962   PT Stop Time 1415   PT Time Calculation (min) 56 min   Equipment Utilized During Treatment Gait belt   Activity Tolerance Patient tolerated treatment well   Behavior During Therapy [facility] for tasks assessed/performed      Past Medical History  Diagnosis Date  . Anemia   . Asthma -COPD   . Chronic fatigue syndrome   . GERD (gastroesophageal reflux disease)   . Hyperlipidemia   . Hypertension   . Colon polyp   . Allergic rhinitis   . Vitamin D deficiency   . Pulmonary embolism     1980s  . Arthritis   . 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 Blood transfusion 1957  . Osteoporosis   . Hypothyroid   . Constipation   . Seizures     1990 last seizures on meds Phenobarb  . Skin cancer     squamous cell  . Neurogenic bladder 2013    husband caths pt  . chronic sinusitis 08/08/2012  . Shingles late 85's  . Cystocele 09/16/2012  . Tubular adenoma   . Breast mass     right breast in milk duct, bx neg  . Thyroid nodule     Right thyroid lobe, only seen on Sagittal imaging measures 2.3 cm in craniocaudal dimension and appears stable    Past Surgical History  Procedure Laterality Date  . Tonsillectomy and adenoidectomy    . Nasal septum surgery    . Dilation and curettage of uterus    . Tubal ligation    . Cesarean section    . Sinus surgeries      x 4  . Left ovary and tube removed    . Vocal polyps removed    . Appendectomy    . Cystocele repair    . Carpal tunnel release     right  . Shoulder arthroscopy      x2 left, 1 right  . Left finger fusion      3 fingers on left hand/one finger right  . Right median nerve decompression    . Duptyren's contracture right hand    . Finger ganglion cyst excision      right  . Abdominal hysterectomy    . Joint replacement      right and left basal joints of thumbs  . Bladder suspension    . Cataract extraction      bilateral  . Cervical neck ablation      x 7, C3-C6/3 screws and plate  . Squamous lesions removed      neck and face  . Basal cell carcinoma excision      face  . Panniculectomy    . Nasal polyp surgery      4 sinus surgeries  . Anterior fusion clivus-c2 extraoral w/ odontoid excision  8/14  . Proximal interphalangeal fusion (pip) Left 09/09/2013    Procedure: FUSION LEFT INDEX PROXIMAL INTERPHALANGEAL JOINT (PIP);  Surgeon: Cammie Sickle., MD;  Location:  [facility];  Service: Orthopedics;  Laterality: Left;  . Breast ductal system excision Right 01/15/2014    Procedure: EXCISION DUCTAL SYSTEM RIGHT BREAST;  Surgeon: Stark Klein, MD;  Location: [facility];  Service: General;  Laterality: Right;    There were no vitals filed for this visit.  Visit Diagnosis:  Abnormality of gait  Generalized weakness      Subjective Assessment - 12/03/14 1326    Subjective The patient walked on unlevel surfaces when travelling with husband's assistance.  HEP going well.  No falls. Pt feels more confident with ambulation.    Currently in Pain? No/denies      NEUROMUSCULAR RE-EDUCATION: Berg=44/56      [facility] PT Assessment - 12/03/14 1329    Ambulation/Gait   Gait velocity 2.36 ft/sec   Berg Balance Test   Sit to Stand Able to stand without using hands and stabilize independently   Standing Unsupported Able to stand safely 2 minutes   Sitting with Back Unsupported but Feet Supported on Floor or Stool Able to sit safely and securely 2 minutes   Stand to Sit Sits safely  with minimal use of hands   Transfers Able to transfer safely, minor use of hands   Standing Unsupported with Eyes Closed Able to stand 10 seconds safely   Standing Ubsupported with Feet Together Able to place feet together independently and stand 1 minute safely   From Standing, Reach Forward with Outstretched Arm Can reach forward >12 cm safely (5")   From Standing Position, Pick up Object from Floor Able to pick up shoe safely and easily   From Standing Position, Turn to Look Behind Over each Shoulder Turn sideways only but maintains balance   Turn 360 Degrees Needs close supervision or verbal cueing   Standing Unsupported, Alternately Place Feet on Step/Stool Able to complete 4 steps without aid or supervision   Standing Unsupported, One Foot in Front Able to take small step independently and hold 30 seconds   Standing on One Leg Able to lift leg independently and hold equal to or more than 3 seconds   Total Score 44     Gait: Treadmill emphasizing eccentric control of anterior tibialis focusing on heel strike and decreasing foot slap x 5 minutes Gait with tripod tip SPC with supervision on level surfaces >400 ft with cues on posture and ankle control  THERAPEUTIC EXERCISE: Elliptical x 2 minutes forward and 30 seconds backwards with CGA for safety Anterior tibialis with band (red) in standing focusing on eccentric control of ankles Standing ankle dorsiflexion alternating and then bilaterally with UE support in parallel bars Sit<>stand with emphasis on posture (shoulders back and down)  SELF CARE/HOME MANAGEMENT: Discussed gym routine/discharge planning, recommendation for endurance tasks due to patient's husband reports of shortness of breath being limiting factor          PT Education - 12/03/14 1427    Education provided Yes   Education Details discussed return to gym recommending: cardio with supervision (treadmill, elliptical x 2 min, seated stepper), strength (ankle), and  balance activities with assist.   Person(s) Educated Patient;Spouse   Methods Explanation   Comprehension Verbalized understanding          PT Short Term Goals - 12/03/14 1341    PT SHORT TERM GOAL #1   Title The patient will be indep with HEP for LE strengthening, balance and gait.   Baseline Target date 12/02/2014   Time 4   Period Weeks  Status Achieved   PT SHORT TERM GOAL #2   Title The patient will improve Berg from 32/56 up to 38/56 to demo decreasing risk for falls.   Baseline Pt scores 44/56 on 12/03/2014   Time 4   Period Weeks   Status Achieved   PT SHORT TERM GOAL #3   Title The patient will improve gait speed from 1.89 ft/sec up to 2.1 ft/sec to demo improving mobility.   Baseline Pt scores 2.36 ft/sec on 12/03/2014   Time 4   Period Weeks   Status Achieved   PT SHORT TERM GOAL #4   Title The patient will negotiate 4 steps with reciproal pattern with bilateral handrails modified indep.   Baseline Met on 12/03/2014 with one handrail.   Time 4   Period Weeks   Status Achieved           PT Long Term Goals - 12/03/14 1343    PT LONG TERM GOAL #1   Title The patient will have gym routine for post d/c wellness with supervision from husband.   Baseline Target date 01/01/2015   Time 8   Period Weeks   PT LONG TERM GOAL #2   Title The patient will improve Berg from 32/56 up to 42/56 to demo decreasing risk for falls.   Baseline See STG.   Time 8   Period Weeks   Status Achieved   PT LONG TERM GOAL #3   Title The patient will improve gait speed from 1.89 ft/sec up to 2.4 ft/sec to demo improved mobility.   Baseline Target date 01/01/2015   Time 8   Period Weeks   PT LONG TERM GOAL #4   Title The patient will negotiate outdoor surfaces with rollater RW modified indep.   Baseline Target date 01/01/2015   Time 8   Period Weeks   PT LONG TERM GOAL #5   Title The patient will improve functional status survey results from 48% up to 60% for improved subjective report of  mobility.   Baseline Target date 01/01/2015   Time 8   Period Weeks               Plan - 12/03/14 1428    Clinical Impression Statement The patient met STGs and LTG for berg score.  She reports feeling significant change in confidence with balance since beginning PT.  PT and patient discussed early d/c, however want to first have patient try gym routine to ensure post d/c plan with be effective at maintaining gains made in PT.  Also, PT to provide 3 core exercises to incorporate into routine at gym.   PT Next Visit Plan Discuss gym routine (if tried), core exercises provided for HEP/gym, towel roll posture stretch, ant/posterior weight shifting.   Consulted and Agree with Plan of Care Patient;Family member/caregiver   Family Member Consulted spouse        Problem List Patient Active Problem List   Diagnosis Date Noted  . Annual physical exam 08/07/2014  . Bloody discharge from nipple 12/29/2013  . Breast mass, right 12/11/2013  . At high risk for falls 12/11/2013  . Renal cysts, acquired, bilateral 05/04/2013  . Elevated LFTs 04/01/2013  . Cervical spinal stenosis 11/21/2012  . Nondiabetic gastroparesis 08/20/2012  . Unspecified hypothyroidism 08/08/2012  . Gastroparesis 05/06/2012  . Muscle cramp 01/19/2012  . Fatigue 01/14/2012  . Neurogenic bladder 11/05/2011  . Fecal incontinence 10/03/2011  . Osteoporosis 07/24/2011  . Adenoma of cecum 07/17/2011  . Chronic constipation  07/17/2011  . Skin lesion 07/08/2011  . Chronic neck pain 04/16/2011  . Rectocele 04/16/2011  . Hypersomnia 04/16/2011  . Vitamin D deficiency 04/16/2011  . Family history of colon cancer 04/16/2011  . Abnormal blood chemistry 04/16/2011  . Anemia   . COPD (chronic obstructive pulmonary disease) gold stage B   . Depression   . GERD (gastroesophageal reflux disease)   . Hyperlipidemia   . Hypertension   . Allergic rhinitis   . Pulmonary embolism   . Skin cancer   . Seizures   . CIDP  (chronic inflammatory demyelinating polyneuropathy)     Renn Dirocco, PT 12/03/2014, 2:37 PM  [facility] [address] [place], [place], [address] Phone: [phone]   Fax:  [phone]

## 2014-12-07 ENCOUNTER — Other Ambulatory Visit: Payer: Self-pay

## 2014-12-07 DIAGNOSIS — Z4789 Encounter for other orthopedic aftercare: Secondary | ICD-10-CM | POA: Diagnosis not present

## 2014-12-07 DIAGNOSIS — Z1231 Encounter for screening mammogram for malignant neoplasm of breast: Secondary | ICD-10-CM

## 2014-12-07 DIAGNOSIS — M654 Radial styloid tenosynovitis [de Quervain]: Secondary | ICD-10-CM | POA: Diagnosis not present

## 2014-12-07 DIAGNOSIS — L600 Ingrowing nail: Secondary | ICD-10-CM | POA: Diagnosis not present

## 2014-12-08 ENCOUNTER — Ambulatory Visit: Payer: Medicare Other | Admitting: Rehabilitative and Restorative Service Providers"

## 2014-12-08 DIAGNOSIS — R531 Weakness: Secondary | ICD-10-CM

## 2014-12-08 DIAGNOSIS — R269 Unspecified abnormalities of gait and mobility: Secondary | ICD-10-CM | POA: Diagnosis not present

## 2014-12-08 NOTE — Therapy (Signed)
[facility] [address] [place], [place], [address] Phone: [phone]   Fax:  [phone]  Physical Therapy Treatment  Patient Details  Name: [name] MRN: [id number] Date of Birth: [birth year] Referring Provider:  Colon Branch, MD  Encounter Date: 12/08/2014      PT End of Session - 12/08/14 1425    Visit Number 9  error in visit # on 7/8 visit   Number of Visits 16   Date for PT Re-Evaluation 12/31/14   Authorization Type g code every 10th visit   PT Start Time 1020   PT Stop Time 1104   PT Time Calculation (min) 44 min   Equipment Utilized During Treatment Gait belt   Activity Tolerance Patient tolerated treatment well   Behavior During Therapy [facility] for tasks assessed/performed      Past Medical History  Diagnosis Date  . Anemia   . Asthma -COPD   . Chronic fatigue syndrome   . GERD (gastroesophageal reflux disease)   . Hyperlipidemia   . Hypertension   . Colon polyp   . Allergic rhinitis   . Vitamin D deficiency   . Pulmonary embolism     1980s  . Arthritis   . 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 Blood transfusion 1957  . Osteoporosis   . Hypothyroid   . Constipation   . Seizures     1990 last seizures on meds Phenobarb  . Skin cancer     squamous cell  . Neurogenic bladder 2013    husband caths pt  . chronic sinusitis 08/08/2012  . Shingles late 55's  . Cystocele 09/16/2012  . Tubular adenoma   . Breast mass     right breast in milk duct, bx neg  . Thyroid nodule     Right thyroid lobe, only seen on Sagittal imaging measures 2.3 cm in craniocaudal dimension and appears stable    Past Surgical History  Procedure Laterality Date  . Tonsillectomy and adenoidectomy    . Nasal septum surgery    . Dilation and curettage of uterus    . Tubal ligation    . Cesarean section    . Sinus surgeries      x 4  . Left ovary and tube removed    . Vocal polyps  removed    . Appendectomy    . Cystocele repair    . Carpal tunnel release      right  . Shoulder arthroscopy      x2 left, 1 right  . Left finger fusion      3 fingers on left hand/one finger right  . Right median nerve decompression    . Duptyren's contracture right hand    . Finger ganglion cyst excision      right  . Abdominal hysterectomy    . Joint replacement      right and left basal joints of thumbs  . Bladder suspension    . Cataract extraction      bilateral  . Cervical neck ablation      x 7, C3-C6/3 screws and plate  . Squamous lesions removed      neck and face  . Basal cell carcinoma excision      face  . Panniculectomy    . Nasal polyp surgery      4 sinus surgeries  . Anterior fusion clivus-c2 extraoral w/ odontoid excision  8/14  . Proximal interphalangeal fusion (pip) Left 09/09/2013  Procedure: FUSION LEFT INDEX PROXIMAL INTERPHALANGEAL JOINT (PIP);  Surgeon: Robert V Sypher Jr., MD;  Location: [facility];  Service: Orthopedics;  Laterality: Left;  . Breast ductal system excision Right 01/15/2014    Procedure: EXCISION DUCTAL SYSTEM RIGHT BREAST;  Surgeon: Faera Byerly, MD;  Location: [facility];  Service: General;  Laterality: Right;    There were no vitals filed for this visit.  Visit Diagnosis:  Abnormality of gait  Generalized weakness      Subjective Assessment - 12/08/14 1023    Subjective The patient has been walking indoors without SPC reporting she slows down and widens her base of support.   Currently in Pain? No/denies      THERAPEUTIC EXERCISE: Supine bridges x 10 Pelvic tilts x 10 with towel roll Bridges with marching Supine marching with UEs for core stabilization Mini crunch with cues on technique x 15 reps  NEUROMUSCULAR RE-EDUCATION: Standing marching without UE support Discussion of balance activities for the gym for post d/c planning  Without device x 100 ft with wider base of support *PT  recommends continued use of SPC due to h/o falls and unsteadiness due to neuropathy       PT Education - 12/08/14 1424    Education provided Yes   Education Details HEP: supine bridge with marching, hamstring stretch and mini crunch   Person(s) Educated Patient;Spouse   Methods Handout;Demonstration;Explanation   Comprehension Verbalized understanding;Returned demonstration          PT Short Term Goals - 12/03/14 1341    PT SHORT TERM GOAL #1   Title The patient will be indep with HEP for LE strengthening, balance and gait.   Baseline Target date 12/02/2014   Time 4   Period Weeks   Status Achieved   PT SHORT TERM GOAL #2   Title The patient will improve Berg from 32/56 up to 38/56 to demo decreasing risk for falls.   Baseline Pt scores 44/56 on 12/03/2014   Time 4   Period Weeks   Status Achieved   PT SHORT TERM GOAL #3   Title The patient will improve gait speed from 1.89 ft/sec up to 2.1 ft/sec to demo improving mobility.   Baseline Pt scores 2.36 ft/sec on 12/03/2014   Time 4   Period Weeks   Status Achieved   PT SHORT TERM GOAL #4   Title The patient will negotiate 4 steps with reciproal pattern with bilateral handrails modified indep.   Baseline Met on 12/03/2014 with one handrail.   Time 4   Period Weeks   Status Achieved           PT Long Term Goals - 12/03/14 1343    PT LONG TERM GOAL #1   Title The patient will have gym routine for post d/c wellness with supervision from husband.   Baseline Target date 01/01/2015   Time 8   Period Weeks   PT LONG TERM GOAL #2   Title The patient will improve Berg from 32/56 up to 42/56 to demo decreasing risk for falls.   Baseline See STG.   Time 8   Period Weeks   Status Achieved   PT LONG TERM GOAL #3   Title The patient will improve gait speed from 1.89 ft/sec up to 2.4 ft/sec to demo improved mobility.   Baseline Target date 01/01/2015   Time 8   Period Weeks   PT LONG TERM GOAL #4   Title The patient will negotiate    outdoor surfaces with rollater RW modified indep.   Baseline Target date 01/01/2015   Time 8   Period Weeks   PT LONG TERM GOAL #5   Title The patient will improve functional status survey results from 48% up to 60% for improved subjective report of mobility.   Baseline Target date 01/01/2015   Time 8   Period Weeks               Plan - 12/08/14 1426    Clinical Impression Statement The patient is continuing to improve confidence with mobility.  PT added further post d/c program to perform at gym with husband's assistance.  Will recheck goals and plan to d/c early due to progress and meeting goals ahead of schedule.   PT Next Visit Plan Check progress with gym routine, anterior/posterior weight shifting, balance, d/c planning (anticipate early d/c)   Consulted and Agree with Plan of Care Patient;Family member/caregiver   Family Member Consulted spouse        Problem List Patient Active Problem List   Diagnosis Date Noted  . Annual physical exam 08/07/2014  . Bloody discharge from nipple 12/29/2013  . Breast mass, right 12/11/2013  . At high risk for falls 12/11/2013  . Renal cysts, acquired, bilateral 05/04/2013  . Elevated LFTs 04/01/2013  . Cervical spinal stenosis 11/21/2012  . Nondiabetic gastroparesis 08/20/2012  . Unspecified hypothyroidism 08/08/2012  . Gastroparesis 05/06/2012  . Muscle cramp 01/19/2012  . Fatigue 01/14/2012  . Neurogenic bladder 11/05/2011  . Fecal incontinence 10/03/2011  . Osteoporosis 07/24/2011  . Adenoma of cecum 07/17/2011  . Chronic constipation 07/17/2011  . Skin lesion 07/08/2011  . Chronic neck pain 04/16/2011  . Rectocele 04/16/2011  . Hypersomnia 04/16/2011  . Vitamin D deficiency 04/16/2011  . Family history of colon cancer 04/16/2011  . Abnormal blood chemistry 04/16/2011  . Anemia   . COPD (chronic obstructive pulmonary disease) gold stage B   . Depression   . GERD (gastroesophageal reflux disease)   . Hyperlipidemia    . Hypertension   . Allergic rhinitis   . Pulmonary embolism   . Skin cancer   . Seizures   . CIDP (chronic inflammatory demyelinating polyneuropathy)     WEAVER,CHRISTINA, PT 12/08/2014, 2:35 PM  [facility] [address] [place], [place], [address] Phone: [phone]   Fax:  [phone]      

## 2014-12-08 NOTE — Patient Instructions (Signed)
Bridge Pose, One Leg   Roll up from tailbone to bridge pose and lift leg into marching position.  Hold for 3 seconds and then switch legs. Focus on engaging posterior hip muscles.  Repeat __5__ times each leg.  Rest and repeat a second set.  Copyright  VHI. All rights reserved.  Crunch: Bent Knee   ARMS AT YOUR SIDE, tighten abdominals, raise shoulders and upper back toward ceiling while reaching towards your knees . Keep head and neck in line with spine. Keep low and middle back on floor. Complete _10-20___ repetitions.  http://st.exer.us/17   Copyright  VHI. All rights reserved.  Hamstring Stretch   Sitting with leg straight on bed, and foot of other leg on floor, lean forward toward toes of straight leg KEEPING BACK STRAIGHT. Hold _20-30___ seconds. Repeat __2__ times. Do __2__ sessions per day.  http://gt2.exer.us/303   Copyright  VHI. All rights reserved.

## 2014-12-10 DIAGNOSIS — R252 Cramp and spasm: Secondary | ICD-10-CM | POA: Diagnosis not present

## 2014-12-11 ENCOUNTER — Ambulatory Visit: Payer: Medicare Other | Admitting: Physical Therapy

## 2014-12-11 ENCOUNTER — Other Ambulatory Visit: Payer: Self-pay | Admitting: Internal Medicine

## 2014-12-11 ENCOUNTER — Encounter: Payer: Self-pay | Admitting: Physical Therapy

## 2014-12-11 DIAGNOSIS — R269 Unspecified abnormalities of gait and mobility: Secondary | ICD-10-CM | POA: Diagnosis not present

## 2014-12-11 DIAGNOSIS — R531 Weakness: Secondary | ICD-10-CM | POA: Diagnosis not present

## 2014-12-11 NOTE — Therapy (Signed)
[facility] [address] [place], [place], [address] Phone: [phone]   Fax:  [phone]  Physical Therapy Treatment  Patient Details  Name: [name] MRN: [id number] Date of Birth: 12-24-1932 Referring Provider:  Colon Branch, MD  Encounter Date: 12/11/2014      PT End of Session - 12/11/14 1450    Visit Number 10  error in visit # on 7/8 visit   Number of Visits 16   Date for PT Re-Evaluation 12/31/14   Authorization Type g code every 10th visit   PT Start Time 1448   PT Stop Time 1532   PT Time Calculation (min) 44 min   Equipment Utilized During Treatment Gait belt   Activity Tolerance Patient tolerated treatment well   Behavior During Therapy [facility] for tasks assessed/performed      Past Medical History  Diagnosis Date  . Anemia   . Asthma -COPD   . Chronic fatigue syndrome   . GERD (gastroesophageal reflux disease)   . Hyperlipidemia   . Hypertension   . Colon polyp   . Allergic rhinitis   . Vitamin D deficiency   . Pulmonary embolism     1980s  . Arthritis   . 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 Blood transfusion 1957  . Osteoporosis   . Hypothyroid   . Constipation   . Seizures     1990 last seizures on meds Phenobarb  . Skin cancer     squamous cell  . Neurogenic bladder 2013    husband caths pt  . chronic sinusitis 08/08/2012  . Shingles late 23's  . Cystocele 09/16/2012  . Tubular adenoma   . Breast mass     right breast in milk duct, bx neg  . Thyroid nodule     Right thyroid lobe, only seen on Sagittal imaging measures 2.3 cm in craniocaudal dimension and appears stable    Past Surgical History  Procedure Laterality Date  . Tonsillectomy and adenoidectomy    . Nasal septum surgery    . Dilation and curettage of uterus    . Tubal ligation    . Cesarean section    . Sinus surgeries      x 4  . Left ovary and tube removed    . Vocal polyps  removed    . Appendectomy    . Cystocele repair    . Carpal tunnel release      right  . Shoulder arthroscopy      x2 left, 1 right  . Left finger fusion      3 fingers on left hand/one finger right  . Right median nerve decompression    . Duptyren's contracture right hand    . Finger ganglion cyst excision      right  . Abdominal hysterectomy    . Joint replacement      right and left basal joints of thumbs  . Bladder suspension    . Cataract extraction      bilateral  . Cervical neck ablation      x 7, C3-C6/3 screws and plate  . Squamous lesions removed      neck and face  . Basal cell carcinoma excision      face  . Panniculectomy    . Nasal polyp surgery      4 sinus surgeries  . Anterior fusion clivus-c2 extraoral w/ odontoid excision  8/14  . Proximal interphalangeal fusion (pip) Left 09/09/2013  Procedure: FUSION LEFT INDEX PROXIMAL INTERPHALANGEAL JOINT (PIP);  Surgeon: Cammie Sickle., MD;  Location: [facility];  Service: Orthopedics;  Laterality: Left;  . Breast ductal system excision Right 01/15/2014    Procedure: EXCISION DUCTAL SYSTEM RIGHT BREAST;  Surgeon: Stark Klein, MD;  Location: [facility];  Service: General;  Laterality: Right;    There were no vitals filed for this visit.  Visit Diagnosis:  Generalized weakness  Abnormality of gait      Subjective Assessment - 12/11/14 1450    Subjective No new complaints or falls to report. Denies pain. Pt reported has not been to gym with husband yet, but plans to go after next week when returns from vacation.   Patient is accompained by: Family member   Patient Stated Goals Improve gait and balance; husband reports provide HEP and improve steps   Currently in Pain? No/denies           [facility] Adult PT Treatment/Exercise - 12/11/14 1457    Ambulation/Gait   Ambulation/Gait Yes   Ambulation/Gait Assistance 5: Supervision;4: Min guard   Ambulation/Gait Assistance Details  supervision with occasional min guard with turns; decreased cervical/trunk rotation and occasional misstep with scanning environment   Ambulation Distance (Feet) 330 Feet   Assistive device Straight cane  tripod tip   Gait Pattern Step-through pattern;Decreased stride length;Narrow base of support   Ambulation Surface Level;Indoor   Gait velocity 2.53 ft/sec  12.97 sec with tripod tip straight cane      Neuro Re-Ed: -At counter on level surface (min guard for safety; single UE support on counter needed; cues for posture, sequencing, and technique): marching, heel walking, toe walking, tandem walking, cross overs, braiding x2 laps each ex -On air ex pad in parallel bars (min guard to min assist for balance; cues for posture, sequencing, and technique):  DLS eyes open without UE support x30 sec  DLS eyes closed without UE support 3x20 sec (increased posterior and R lateral sway)  Toe taps to front 2x20; toe taps to back 2x20; toe taps to side x20 (slight B UE support needed and frequent cues for maintaining foot position on pad for each ex)           PT Short Term Goals - 12/03/14 1341    PT SHORT TERM GOAL #1   Title The patient will be indep with HEP for LE strengthening, balance and gait.   Baseline Target date 12/02/2014   Time 4   Period Weeks   Status Achieved   PT SHORT TERM GOAL #2   Title The patient will improve Berg from 32/56 up to 38/56 to demo decreasing risk for falls.   Baseline Pt scores 44/56 on 12/03/2014   Time 4   Period Weeks   Status Achieved   PT SHORT TERM GOAL #3   Title The patient will improve gait speed from 1.89 ft/sec up to 2.1 ft/sec to demo improving mobility.   Baseline Pt scores 2.36 ft/sec on 12/03/2014   Time 4   Period Weeks   Status Achieved   PT SHORT TERM GOAL #4   Title The patient will negotiate 4 steps with reciproal pattern with bilateral handrails modified indep.   Baseline Met on 12/03/2014 with one handrail.   Time 4   Period Weeks    Status Achieved           PT Long Term Goals - 12/11/14 1558    PT LONG TERM GOAL #1  Title The patient will have gym routine for post d/c wellness with supervision from husband.   Baseline Target date 01/01/2015   Time 8   Period Weeks   PT LONG TERM GOAL #2   Title The patient will improve Berg from 32/56 up to 42/56 to demo decreasing risk for falls.   Baseline See STG.   Time 8   Period Weeks   Status Achieved   PT LONG TERM GOAL #3   Title The patient will improve gait speed from 1.89 ft/sec up to 2.4 ft/sec to demo improved mobility.   Baseline Target date 01/01/2015; met 12/11/14: 2.53 ft/sec with tripod tip straight cane   Time 8   Period Weeks   Status Achieved   PT LONG TERM GOAL #4   Title The patient will negotiate outdoor surfaces with rollater RW modified indep.   Baseline Target date 01/01/2015   Time 8   Period Weeks   PT LONG TERM GOAL #5   Title The patient will improve functional status survey results from 48% up to 60% for improved subjective report of mobility.   Baseline Target date 01/01/2015   Time 8   Period Weeks            Plan - 12/11/14 1548    Clinical Impression Statement Pt is continuing to improve confidence with mobility. Challenged with standing balance activities on compliant surface. Met LTG #3 with increased gait speed to 2.53 ft/sec. Making progress toward other goals. Continue with PT plan to assess goals and progress with gym routine next session and plan to d/c early due to progress and potentially meeting goals ahead of schedule.    Pt will benefit from skilled therapeutic intervention in order to improve on the following deficits Abnormal gait;Decreased balance;Impaired sensation;Postural dysfunction;Decreased mobility;Decreased coordination;Decreased strength;Decreased endurance;Difficulty walking   Rehab Potential Good   Clinical Impairments Affecting Rehab Potential significant neuropathy in LEs   PT Frequency 2x / week   PT  Duration 8 weeks   PT Treatment/Interventions Gait training;ADLs/Self Care Home Management;Neuromuscular re-education;Balance training;Therapeutic exercise;Therapeutic activities;Functional mobility training;Stair training;Patient/family education   PT Next Visit Plan Check progress with gym routine, anterior/posterior weight shifting, balance, d/c planning (anticipate early d/c)   Consulted and Agree with Plan of Care Patient;Family member/caregiver   Family Member Consulted spouse        Problem List Patient Active Problem List   Diagnosis Date Noted  . Annual physical exam 08/07/2014  . Bloody discharge from nipple 12/29/2013  . Breast mass, right 12/11/2013  . At high risk for falls 12/11/2013  . Renal cysts, acquired, bilateral 05/04/2013  . Elevated LFTs 04/01/2013  . Cervical spinal stenosis 11/21/2012  . Nondiabetic gastroparesis 08/20/2012  . Unspecified hypothyroidism 08/08/2012  . Gastroparesis 05/06/2012  . Muscle cramp 01/19/2012  . Fatigue 01/14/2012  . Neurogenic bladder 11/05/2011  . Fecal incontinence 10/03/2011  . Osteoporosis 07/24/2011  . Adenoma of cecum 07/17/2011  . Chronic constipation 07/17/2011  . Skin lesion 07/08/2011  . Chronic neck pain 04/16/2011  . Rectocele 04/16/2011  . Hypersomnia 04/16/2011  . Vitamin D deficiency 04/16/2011  . Family history of colon cancer 04/16/2011  . Abnormal blood chemistry 04/16/2011  . Anemia   . COPD (chronic obstructive pulmonary disease) gold stage B   . Depression   . GERD (gastroesophageal reflux disease)   . Hyperlipidemia   . Hypertension   . Allergic rhinitis   . Pulmonary embolism   . Skin cancer   . Seizures   .  CIDP (chronic inflammatory demyelinating polyneuropathy)     Rubye Oaks 12/11/2014, 3:59 PM  Rubye Oaks, [facility] [address] [place], [place], [address] Phone: [phone]   Fax:   [phone]

## 2014-12-15 ENCOUNTER — Ambulatory Visit: Payer: Medicare Other | Admitting: Physical Therapy

## 2014-12-17 ENCOUNTER — Ambulatory Visit: Payer: Medicare Other | Admitting: Rehabilitative and Restorative Service Providers"

## 2014-12-18 ENCOUNTER — Ambulatory Visit: Payer: Medicare Other | Admitting: Physical Therapy

## 2014-12-21 ENCOUNTER — Other Ambulatory Visit: Payer: Self-pay | Admitting: Internal Medicine

## 2014-12-22 ENCOUNTER — Encounter: Payer: Self-pay | Admitting: Physical Therapy

## 2014-12-22 ENCOUNTER — Ambulatory Visit: Payer: Medicare Other | Admitting: Physical Therapy

## 2014-12-22 DIAGNOSIS — R531 Weakness: Secondary | ICD-10-CM | POA: Diagnosis not present

## 2014-12-22 DIAGNOSIS — R269 Unspecified abnormalities of gait and mobility: Secondary | ICD-10-CM | POA: Diagnosis not present

## 2014-12-22 NOTE — Therapy (Signed)
[facility] [address] [place], [place], [address] Phone: [phone]   Fax:  [phone]  Physical Therapy Treatment  Patient Details  Name: [name] MRN: [id number] Date of Birth: 03/16/1933 Referring Provider:  Colon Branch, MD  Encounter Date: 12/22/2014      PT End of Session - 12/22/14 1318    Visit Number 11   Number of Visits 16   Date for PT Re-Evaluation 12/31/14   Authorization Type g code every 10th visit   PT Start Time 1316   PT Stop Time 1350   PT Time Calculation (min) 34 min   Equipment Utilized During Treatment Gait belt   Activity Tolerance Patient tolerated treatment well   Behavior During Therapy [facility] for tasks assessed/performed      Past Medical History  Diagnosis Date  . Anemia   . Asthma -COPD   . Chronic fatigue syndrome   . GERD (gastroesophageal reflux disease)   . Hyperlipidemia   . Hypertension   . Colon polyp   . Allergic rhinitis   . Vitamin D deficiency   . Pulmonary embolism     1980s  . Arthritis   . 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 Blood transfusion 1957  . Osteoporosis   . Hypothyroid   . Constipation   . Seizures     1990 last seizures on meds Phenobarb  . Skin cancer     squamous cell  . Neurogenic bladder 2013    husband caths pt  . chronic sinusitis 08/08/2012  . Shingles late 31's  . Cystocele 09/16/2012  . Tubular adenoma   . Breast mass     right breast in milk duct, bx neg  . Thyroid nodule     Right thyroid lobe, only seen on Sagittal imaging measures 2.3 cm in craniocaudal dimension and appears stable    Past Surgical History  Procedure Laterality Date  . Tonsillectomy and adenoidectomy    . Nasal septum surgery    . Dilation and curettage of uterus    . Tubal ligation    . Cesarean section    . Sinus surgeries      x 4  . Left ovary and tube removed    . Vocal polyps removed    . Appendectomy    .  Cystocele repair    . Carpal tunnel release      right  . Shoulder arthroscopy      x2 left, 1 right  . Left finger fusion      3 fingers on left hand/one finger right  . Right median nerve decompression    . Duptyren's contracture right hand    . Finger ganglion cyst excision      right  . Abdominal hysterectomy    . Joint replacement      right and left basal joints of thumbs  . Bladder suspension    . Cataract extraction      bilateral  . Cervical neck ablation      x 7, C3-C6/3 screws and plate  . Squamous lesions removed      neck and face  . Basal cell carcinoma excision      face  . Panniculectomy    . Nasal polyp surgery      4 sinus surgeries  . Anterior fusion clivus-c2 extraoral w/ odontoid excision  8/14  . Proximal interphalangeal fusion (pip) Left 09/09/2013    Procedure: FUSION LEFT INDEX PROXIMAL INTERPHALANGEAL  JOINT (PIP);  Surgeon: Cammie Sickle., MD;  Location: [facility];  Service: Orthopedics;  Laterality: Left;  . Breast ductal system excision Right 01/15/2014    Procedure: EXCISION DUCTAL SYSTEM RIGHT BREAST;  Surgeon: Stark Klein, MD;  Location: [facility];  Service: General;  Laterality: Right;    There were no vitals filed for this visit.  Visit Diagnosis:  Generalized weakness  Abnormality of gait      Subjective Assessment - 12/22/14 1319    Subjective No new complaints or falls to report. Denies pain. Pt reports going to gym and husband had her use elliptical and bike with no problem. Some issues with a couple of exercises and wants to review them today.   Patient is accompained by: Family member   Patient Stated Goals Improve gait and balance; husband reports provide HEP and improve steps   Currently in Pain? No/denies          [facility] Adult PT Treatment/Exercise - 12/22/14 1344    Ambulation/Gait   Ambulation/Gait Yes   Ambulation/Gait Assistance 5: Supervision   Ambulation/Gait Assistance Details  supervision for safety, especially on grass   Ambulation Distance (Feet) 500 Feet   Assistive device Straight cane  tripod tip   Gait Pattern Step-through pattern;Decreased stride length;Narrow base of support   Ambulation Surface Level;Unlevel;Indoor;Outdoor;Paved;Gravel;Grass           PT Education - 12/22/14 1348    Education provided Yes   Education Details Reviewed HEP: supine bridge with marching, hamstring stretch, and mini crunch   Person(s) Educated Patient;Spouse   Methods Explanation;Demonstration;Verbal cues   Comprehension Verbalized understanding;Returned demonstration          PT Short Term Goals - 12/03/14 1341    PT SHORT TERM GOAL #1   Title The patient will be indep with HEP for LE strengthening, balance and gait.   Baseline Target date 12/02/2014   Time 4   Period Weeks   Status Achieved   PT SHORT TERM GOAL #2   Title The patient will improve Berg from 32/56 up to 38/56 to demo decreasing risk for falls.   Baseline Pt scores 44/56 on 12/03/2014   Time 4   Period Weeks   Status Achieved   PT SHORT TERM GOAL #3   Title The patient will improve gait speed from 1.89 ft/sec up to 2.1 ft/sec to demo improving mobility.   Baseline Pt scores 2.36 ft/sec on 12/03/2014   Time 4   Period Weeks   Status Achieved   PT SHORT TERM GOAL #4   Title The patient will negotiate 4 steps with reciproal pattern with bilateral handrails modified indep.   Baseline Met on 12/03/2014 with one handrail.   Time 4   Period Weeks   Status Achieved           PT Long Term Goals - 12/22/14 1355    PT LONG TERM GOAL #1   Title The patient will have gym routine for post d/c wellness with supervision from husband.   Baseline Target date 01/01/2015; Met 12/22/14   Time 8   Period Weeks   Status Achieved   PT LONG TERM GOAL #2   Title The patient will improve Berg from 32/56 up to 42/56 to demo decreasing risk for falls.   Baseline See STG.   Time 8   Period Weeks   Status  Achieved   PT LONG TERM GOAL #3   Title The patient will  improve gait speed from 1.89 ft/sec up to 2.4 ft/sec to demo improved mobility.   Baseline Target date 01/01/2015; met 12/11/14: 2.53 ft/sec with tripod tip straight cane   Time 8   Period Weeks   Status Achieved   PT LONG TERM GOAL #4   Title The patient will negotiate outdoor surfaces with rollater RW modified indep.   Baseline Target date 01/01/2015; Partially met 12/22/14: able to negotiate outdoor surfaces with [facility] with supervision   Time 8   Period Weeks   Status Partially Met   PT LONG TERM GOAL #5   Title The patient will improve functional status survey results from 48% up to 60% for improved subjective report of mobility.   Baseline Target date 01/01/2015; Met 12/22/14 with 62%   Time 8   Period Weeks   Status Achieved           Plan - 12/22/14 1451    Clinical Impression Statement Pt initiated gym routine yesterday with husband, and after reviewing HEP today, both feel confident that pt can continue with gym routine for post d/c wellness. Pt met LTG 1 and 5 today, and partially met LTG 4. Pt agreeable to d/c today based on progress toward goals and PT POC.   Pt will benefit from skilled therapeutic intervention in order to improve on the following deficits Abnormal gait;Decreased balance;Impaired sensation;Postural dysfunction;Decreased mobility;Decreased coordination;Decreased strength;Decreased endurance;Difficulty walking   Rehab Potential Good   Clinical Impairments Affecting Rehab Potential significant neuropathy in LEs   PT Frequency 2x / week   PT Duration 8 weeks   PT Treatment/Interventions Gait training;ADLs/Self Care Home Management;Neuromuscular re-education;Balance training;Therapeutic exercise;Therapeutic activities;Functional mobility training;Stair training;Patient/family education   PT Next Visit Plan d/c today (12/22/14) per PT POC   Consulted and Agree with Plan of Care Patient;Family member/caregiver    Family Member Consulted spouse        Problem List Patient Active Problem List   Diagnosis Date Noted  . Annual physical exam 08/07/2014  . Bloody discharge from nipple 12/29/2013  . Breast mass, right 12/11/2013  . At high risk for falls 12/11/2013  . Renal cysts, acquired, bilateral 05/04/2013  . Elevated LFTs 04/01/2013  . Cervical spinal stenosis 11/21/2012  . Nondiabetic gastroparesis 08/20/2012  . Unspecified hypothyroidism 08/08/2012  . Gastroparesis 05/06/2012  . Muscle cramp 01/19/2012  . Fatigue 01/14/2012  . Neurogenic bladder 11/05/2011  . Fecal incontinence 10/03/2011  . Osteoporosis 07/24/2011  . Adenoma of cecum 07/17/2011  . Chronic constipation 07/17/2011  . Skin lesion 07/08/2011  . Chronic neck pain 04/16/2011  . Rectocele 04/16/2011  . Hypersomnia 04/16/2011  . Vitamin D deficiency 04/16/2011  . Family history of colon cancer 04/16/2011  . Abnormal blood chemistry 04/16/2011  . Anemia   . COPD (chronic obstructive pulmonary disease) gold stage B   . Depression   . GERD (gastroesophageal reflux disease)   . Hyperlipidemia   . Hypertension   . Allergic rhinitis   . Pulmonary embolism   . Skin cancer   . Seizures   . CIDP (chronic inflammatory demyelinating polyneuropathy)     Rubye Oaks 12/22/2014, 3:06 PM  [place], [place], [place], [facility] [address], [place], [place] [address] [phone] 12/23/2014, 2:31 PM

## 2014-12-24 ENCOUNTER — Telehealth: Payer: Self-pay | Admitting: Internal Medicine

## 2014-12-24 NOTE — Telephone Encounter (Signed)
Reason for call: Pt called and scheduled lab appt 12/25/14 10:00am (see below). Please enter orders.    amLODipine (NORVASC) 10 MG tablet [[id number]]     Order Details    Dose: 10 mg Route: Oral Frequency: Daily   Order Comments:   Requested drug refills are authorized, however, the patient needs further evaluation and/or laboratory testing before further refills are given. Ask her to make an appointment for this.

## 2014-12-24 NOTE — Telephone Encounter (Signed)
Scheduled for 8/16

## 2014-12-24 NOTE — Telephone Encounter (Signed)
Please inform Pt that she is due for a routine F/U with Dr. Larose Kells, not just labs. Please have her schedule a routine F/U and cancel lab appt. Thank you.

## 2014-12-25 ENCOUNTER — Ambulatory Visit: Payer: Medicare Other | Admitting: Rehabilitative and Restorative Service Providers"

## 2014-12-25 ENCOUNTER — Other Ambulatory Visit: Payer: Medicare Other

## 2014-12-28 ENCOUNTER — Encounter: Payer: Self-pay | Admitting: Rehabilitative and Restorative Service Providers"

## 2014-12-28 NOTE — Therapy (Signed)
[facility] [address] [place], [place], [address] Phone: [phone]   Fax:  [phone]  Patient Details  Name: [name] MRN: [id number] Date of Birth: Mar 19, 1933 Referring Provider:  No ref. provider found  Encounter Date: last encounter 12/22/2014  PHYSICAL THERAPY DISCHARGE SUMMARY  Visits from Start of Care: 11  Current functional level related to goals / functional outcomes:     PT Short Term Goals - 12/03/14 1341    PT SHORT TERM GOAL #1   Title The patient will be indep with HEP for LE strengthening, balance and gait.   Baseline Target date 12/02/2014   Time 4   Period Weeks   Status Achieved   PT SHORT TERM GOAL #2   Title The patient will improve Berg from 32/56 up to 38/56 to demo decreasing risk for falls.   Baseline Pt scores 44/56 on 12/03/2014   Time 4   Period Weeks   Status Achieved   PT SHORT TERM GOAL #3   Title The patient will improve gait speed from 1.89 ft/sec up to 2.1 ft/sec to demo improving mobility.   Baseline Pt scores 2.36 ft/sec on 12/03/2014   Time 4   Period Weeks   Status Achieved   PT SHORT TERM GOAL #4   Title The patient will negotiate 4 steps with reciproal pattern with bilateral handrails modified indep.   Baseline Met on 12/03/2014 with one handrail.   Time 4   Period Weeks   Status Achieved         PT Long Term Goals - 12/22/14 1355    PT LONG TERM GOAL #1   Title The patient will have gym routine for post d/c wellness with supervision from husband.   Baseline Target date 01/01/2015; Met 12/22/14   Time 8   Period Weeks   Status Achieved   PT LONG TERM GOAL #2   Title The patient will improve Berg from 32/56 up to 42/56 to demo decreasing risk for falls.   Baseline See STG.   Time 8   Period Weeks   Status Achieved   PT LONG TERM GOAL #3   Title The patient will improve gait speed from 1.89 ft/sec up to 2.4 ft/sec to demo improved mobility.   Baseline  Target date 01/01/2015; met 12/11/14: 2.53 ft/sec with tripod tip straight cane   Time 8   Period Weeks   Status Achieved   PT LONG TERM GOAL #4   Title The patient will negotiate outdoor surfaces with rollater RW modified indep.   Baseline Target date 01/01/2015; Partially met 12/22/14: able to negotiate outdoor surfaces with [facility] with supervision   Time 8   Period Weeks   Status Partially Met   PT LONG TERM GOAL #5   Title The patient will improve functional status survey results from 48% up to 60% for improved subjective report of mobility.   Baseline Target date 01/01/2015; Met 12/22/14 with 62%   Time 8   Period Weeks   Status Achieved        Remaining deficits: Decreased high level balance Continued fall risk with patient education/family education and home program for post d/c wellness.   Education / Equipment: HEP, fall prevention  Plan: Patient agrees to discharge.  Patient goals were met. Patient is being discharged due to meeting the stated rehab goals.  ?????Thank you for the referral of this patient. Rudell Cobb, MPT  [name] 12/28/2014, 8:33 AM  [facility] [address] [place], [place], [address] Phone: [phone]   Fax:  [phone]

## 2014-12-29 ENCOUNTER — Ambulatory Visit: Payer: Medicare Other | Admitting: Physical Therapy

## 2014-12-29 ENCOUNTER — Encounter: Payer: Self-pay | Admitting: Internal Medicine

## 2014-12-29 DIAGNOSIS — Z79899 Other long term (current) drug therapy: Secondary | ICD-10-CM | POA: Diagnosis not present

## 2014-12-30 MED ORDER — DEXTROAMPHETAMINE SULFATE 10 MG PO TABS: 10.0000 mg | ORAL_TABLET | Freq: Four times a day (QID) | ORAL | Status: DC

## 2014-12-30 NOTE — Telephone Encounter (Signed)
Pt is requesting refill on Dextrostat. States that the 4 tablets daily is working fine.  Last OV: 08/07/2014 Last Fill: 11/19/2014 #120 0RF UDS: Needed at pick up due to medication change.  Please advise.

## 2014-12-30 NOTE — Telephone Encounter (Signed)
Rx printed, awaiting MD signature.  

## 2014-12-30 NOTE — Telephone Encounter (Signed)
Spoke with Pt, informed her that Rx has been placed at front desk for pick up at her convenience.

## 2015-01-01 ENCOUNTER — Ambulatory Visit: Payer: Medicare Other | Admitting: Rehabilitative and Restorative Service Providers"

## 2015-01-05 ENCOUNTER — Ambulatory Visit: Payer: Medicare Other | Admitting: Rehabilitative and Restorative Service Providers"

## 2015-01-06 ENCOUNTER — Other Ambulatory Visit: Payer: Self-pay

## 2015-01-11 ENCOUNTER — Telehealth: Payer: Self-pay

## 2015-01-11 DIAGNOSIS — N281 Cyst of kidney, acquired: Secondary | ICD-10-CM | POA: Diagnosis not present

## 2015-01-11 DIAGNOSIS — N319 Neuromuscular dysfunction of bladder, unspecified: Secondary | ICD-10-CM | POA: Diagnosis not present

## 2015-01-11 DIAGNOSIS — N819 Female genital prolapse, unspecified: Secondary | ICD-10-CM | POA: Diagnosis not present

## 2015-01-11 NOTE — Telephone Encounter (Signed)
UDS: 01/01/2015  Positive for Phenobarbital Positive for Amphetamine   Low risk per Dr. Larose Kells 01/11/2015

## 2015-01-12 ENCOUNTER — Encounter: Payer: Self-pay | Admitting: Internal Medicine

## 2015-01-12 ENCOUNTER — Telehealth: Payer: Self-pay

## 2015-01-12 ENCOUNTER — Ambulatory Visit (INDEPENDENT_AMBULATORY_CARE_PROVIDER_SITE_OTHER): Payer: Medicare Other | Admitting: Internal Medicine

## 2015-01-12 VITALS — BP 126/82 | HR 84 | Temp 97.6°F | Ht 62.0 in | Wt 130.4 lb

## 2015-01-12 DIAGNOSIS — G6181 Chronic inflammatory demyelinating polyneuritis: Secondary | ICD-10-CM | POA: Diagnosis not present

## 2015-01-12 DIAGNOSIS — J411 Mucopurulent chronic bronchitis: Secondary | ICD-10-CM

## 2015-01-12 DIAGNOSIS — E039 Hypothyroidism, unspecified: Secondary | ICD-10-CM | POA: Diagnosis not present

## 2015-01-12 DIAGNOSIS — N281 Cyst of kidney, acquired: Secondary | ICD-10-CM | POA: Diagnosis not present

## 2015-01-12 DIAGNOSIS — M810 Age-related osteoporosis without current pathological fracture: Secondary | ICD-10-CM | POA: Diagnosis not present

## 2015-01-12 DIAGNOSIS — I10 Essential (primary) hypertension: Secondary | ICD-10-CM

## 2015-01-12 LAB — TSH: TSH: 1.03 u[IU]/mL (ref 0.35–4.50)

## 2015-01-12 NOTE — Patient Instructions (Signed)
Get your blood work before you leave    

## 2015-01-12 NOTE — Progress Notes (Signed)
Subjective:    Patient ID: [name], female    DOB: [birth year], 81 y.o.   MRN: [id number]  DOS:  01/12/2015 Type of visit - description : Routine visit, here with her husband Interval history:   Her main concern is that she has gained some weight despite eating very little.  Wt Readings from Last 3 Encounters:  01/12/15 130 lb 6 oz (59.138 kg)  10/15/14 128 lb (58.06 kg)  08/07/14 128 lb 6 oz (58.231 kg)   Osteoporosis: Due for a prolia next month, needs to start the process of getting it approved. Ataxia, difficulty with gait: Status post rehabilitation prescribed by [facility], doing very good at this point Hypersomnia: Under excellent control with dextroamphetamine  Labs done elsewhere last month: Hemoglobin okay, creatinine 0.7, potassium normal, LFTs okay  Review of Systems Denies chest pain, difficulty breathing No nausea, vomiting, diarrhea. At this point has no major problems with pain.   Past Medical History  Diagnosis Date  . Anemia   . Asthma -COPD   . Chronic fatigue syndrome   . GERD (gastroesophageal reflux disease)   . Hyperlipidemia   . Hypertension   . Colon polyp   . Allergic rhinitis   . Vitamin D deficiency   . Pulmonary embolism     1980s  . Arthritis   . 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 Blood transfusion 1957  . Osteoporosis   . Hypothyroid   . Constipation   . Seizures     1990 last seizures on meds Phenobarb  . Skin cancer     squamous cell  . Neurogenic bladder 2013    husband caths pt  . chronic sinusitis 08/08/2012  . Shingles late 35's  . Cystocele 09/16/2012  . Tubular adenoma   . Breast mass     right breast in milk duct, bx neg  . Thyroid nodule     Right thyroid lobe, only seen on Sagittal imaging measures 2.3 cm in craniocaudal dimension and appears stable    Past Surgical History  Procedure Laterality Date  . Tonsillectomy and adenoidectomy    . Nasal septum surgery    . Dilation and  curettage of uterus    . Tubal ligation    . Cesarean section    . Sinus surgeries      x 4  . Left ovary and tube removed    . Vocal polyps removed    . Appendectomy    . Cystocele repair    . Carpal tunnel release      right  . Shoulder arthroscopy      x2 left, 1 right  . Left finger fusion      3 fingers on left hand/one finger right  . Right median nerve decompression    . Duptyren's contracture right hand    . Finger ganglion cyst excision      right  . Abdominal hysterectomy    . Joint replacement      right and left basal joints of thumbs  . Bladder suspension    . Cataract extraction      bilateral  . Cervical neck ablation      x 7, C3-C6/3 screws and plate  . Squamous lesions removed      neck and face  . Basal cell carcinoma excision      face  . Panniculectomy    . Nasal polyp surgery      4 sinus surgeries  . Anterior fusion clivus-c2  extraoral w/ odontoid excision  8/14  . Proximal interphalangeal fusion (pip) Left 09/09/2013    Procedure: FUSION LEFT INDEX PROXIMAL INTERPHALANGEAL JOINT (PIP);  Surgeon: Cammie Sickle., MD;  Location: [facility];  Service: Orthopedics;  Laterality: Left;  . Breast ductal system excision Right 01/15/2014    Procedure: EXCISION DUCTAL SYSTEM RIGHT BREAST;  Surgeon: Stark Klein, MD;  Location: [facility];  Service: General;  Laterality: Right;    Social History   Social History  . Marital Status: Married    Spouse Name: N/A  . Number of Children: 3  . Years of Education: N/A   Occupational History  . retired, Licensed conveyancer    Social History Main Topics  . Smoking status: Former Smoker -- 0.50 packs/day for 3 years    Types: Cigarettes    Quit date: 05/29/1978  . Smokeless tobacco: Never Used     Comment: quit smoking 35 years ago  . Alcohol Use: No  . Drug Use: No  . Sexual Activity: Yes   Other Topics Concern  . Not on file   Social History Narrative   Pt lives at home  with her spouse.   Moved to [place] ~ 2004           Medication List       This list is accurate as of: 01/12/15 11:59 PM.  Always use your most recent med list.               albuterol 108 (90 BASE) MCG/ACT inhaler  Commonly known as:  PROAIR HFA  Inhale 1 puff into the lungs as needed.     amLODipine 10 MG tablet  Commonly known as:  NORVASC  Take 1 tablet (10 mg total) by mouth daily.     arformoterol 15 MCG/2ML Nebu  Commonly known as:  BROVANA  Take 2 mLs (15 mcg total) by nebulization 2 (two) times daily.     atorvastatin 40 MG tablet  Commonly known as:  LIPITOR  Take 1 tablet (40 mg total) by mouth daily.     budesonide 0.25 MG/2ML nebulizer solution  Commonly known as:  PULMICORT  Take 2 mLs (0.25 mg total) by nebulization 2 (two) times daily.     CALCIUM-VITAMIN D PO  Take 1 tablet by mouth daily.     carbamazepine 100 MG 12 hr tablet  Commonly known as:  TEGRETOL XR  Take by mouth. 3 tablets in the morning and 2 tablets at night     denosumab 60 MG/ML Soln injection  Commonly known as:  PROLIA  Inject 60 mg into the skin every 6 (six) months. Administer in upper arm, thigh, or abdomen     dexlansoprazole 60 MG capsule  Commonly known as:  DEXILANT  Take 1 capsule (60 mg total) by mouth daily. SHOULD TAKE DAILY, NOT PRN!     dextroamphetamine 10 MG tablet  Commonly known as:  DEXTROSTAT  Take 1 tablet (10 mg total) by mouth 4 (four) times daily.     levothyroxine 25 MCG tablet  Commonly known as:  SYNTHROID, LEVOTHROID  Take 1.5 tablets (37.5 mcg total) by mouth daily.     mometasone 50 MCG/ACT nasal spray  Commonly known as:  NASONEX  PLACE 2 SPRAYS INTO THE NOSE DAILY     montelukast 10 MG tablet  Commonly known as:  SINGULAIR  Take 1 tablet (10 mg total) by mouth at bedtime.     ondansetron 4 MG  disintegrating tablet  Commonly known as:  ZOFRAN-ODT  Take 1 tablet (4 mg total) by mouth daily.     PHENobarbital 32.4 MG tablet  Commonly  known as:  LUMINAL  Take 1 tablet (32.4 mg total) by mouth 4 (four) times daily. one tablet at 6 am, one tablet at noon,  two tabletss at 8 pm.     polyethylene glycol packet  Commonly known as:  MIRALAX / GLYCOLAX  Take 17 g by mouth at bedtime.     predniSONE 20 MG tablet  Commonly known as:  DELTASONE  Take 1 tablet (20 mg total) by mouth daily.     VITAMIN C PO  Take 2 capsules by mouth daily.     Vitamin D 2000 UNITS Caps  Take 1 capsule by mouth daily.     VOLTAREN 1 % Gel  Generic drug:  diclofenac sodium  Apply 1 application topically 4 (four) times daily as needed. Applies to fingers and hands for gout.           Objective:   Physical Exam BP 126/82 mmHg  Pulse 84  Temp(Src) 97.6 F (36.4 C) (Oral)  Ht 5\' 2"  (1.575 m)  Wt 130 lb 6 oz (59.138 kg)  BMI 23.84 kg/m2  SpO2 96% General:   Well developed, well nourished . NAD.  HEENT:  Normocephalic . Face symmetric, atraumatic Lungs:  CTA B Normal respiratory effort, no intercostal retractions, no accessory muscle use. Heart: RRR,  no murmur.  No pretibial edema bilaterally  Skin: Not pale. Not jaundice Neurologic:  alert & oriented X3.  Speech normal, gait appropriate for age and unassisted Psych--  Cognition and judgment appear intact.  Cooperative with normal attention span and concentration.  Behavior appropriate. No anxious or depressed appearing.      Assessment & Plan:   Hypertension: Well controlled COPD: We'll control, occasionally uses albuterol at night. Hypothyroidism, recheck labs Weight gain: Recommend a healthy diet and observation. BMI is very good. Recent TSH and other labs normal.

## 2015-01-12 NOTE — Assessment & Plan Note (Addendum)
saw urology 01-11-15, got a ultrasound,

## 2015-01-12 NOTE — Telephone Encounter (Signed)
Prolia Engineer, structural. Pt's next Prolia due after 02/16/2015. Insurance verification form printed and sent for scanning. Awaiting insurance response.

## 2015-01-12 NOTE — Progress Notes (Signed)
Pre visit review using our clinic review tool, if applicable. No additional management support is needed unless otherwise documented below in the visit note. 

## 2015-01-12 NOTE — Assessment & Plan Note (Addendum)
Currently follow-up at [facility], s/p rehabilitation, feeling better

## 2015-01-12 NOTE — Assessment & Plan Note (Addendum)
Density test 07-2014 with a T score of -2.0, improving. Next prolia September 2016

## 2015-01-18 ENCOUNTER — Ambulatory Visit
Admission: RE | Admit: 2015-01-18 | Discharge: 2015-01-18 | Disposition: A | Discharge status: Home / Self Care | Payer: Medicare Other | Source: Ambulatory Visit

## 2015-01-18 DIAGNOSIS — Z1231 Encounter for screening mammogram for malignant neoplasm of breast: Secondary | ICD-10-CM | POA: Diagnosis not present

## 2015-01-19 NOTE — Telephone Encounter (Signed)
Message sent to [facility] for Prolia to be ordered.

## 2015-01-19 NOTE — Telephone Encounter (Signed)
Received Prolia eligibility check on 01/19/2015. Pt's estimated out of pocket cost is 0 % plus $166 deductible. Eligibility check sent for scanning into chart. Record ID: [id number]

## 2015-01-21 NOTE — Telephone Encounter (Signed)
Prolia received, Pt's name and DOB placed on box and placed in fridge.

## 2015-01-21 NOTE — Telephone Encounter (Signed)
[facility] at home number informing Pt that we have received insurance verification through her insurance and that she may call the office at her convenience to schedule a nurse visit to receive Prolia after February 16, 2015.

## 2015-01-26 ENCOUNTER — Encounter: Payer: Self-pay | Admitting: Internal Medicine

## 2015-01-26 DIAGNOSIS — D485 Neoplasm of uncertain behavior of skin: Secondary | ICD-10-CM | POA: Diagnosis not present

## 2015-01-27 NOTE — Telephone Encounter (Signed)
Pt is requesting refill on Dextrostat.  Last OV: 01/12/2015 Last Fill: 12/30/2014 #120 0RF UDS: 01/01/2015 Low risk  Please advise.

## 2015-01-28 ENCOUNTER — Other Ambulatory Visit: Payer: Self-pay

## 2015-01-28 MED ORDER — DEXTROAMPHETAMINE SULFATE 10 MG PO TABS: 10.0000 mg | ORAL_TABLET | Freq: Four times a day (QID) | ORAL | Status: DC

## 2015-01-28 NOTE — Telephone Encounter (Signed)
Rx placed at front desk for pick up at Pt's convenience.  

## 2015-02-04 ENCOUNTER — Other Ambulatory Visit: Payer: Self-pay | Admitting: Internal Medicine

## 2015-02-12 ENCOUNTER — Encounter: Payer: Self-pay | Admitting: Internal Medicine

## 2015-02-12 ENCOUNTER — Ambulatory Visit (INDEPENDENT_AMBULATORY_CARE_PROVIDER_SITE_OTHER): Payer: Medicare Other | Admitting: Internal Medicine

## 2015-02-12 VITALS — BP 128/80 | HR 84 | Ht 61.75 in | Wt 129.5 lb

## 2015-02-12 DIAGNOSIS — R6889 Other general symptoms and signs: Secondary | ICD-10-CM

## 2015-02-12 DIAGNOSIS — K3184 Gastroparesis: Secondary | ICD-10-CM

## 2015-02-12 DIAGNOSIS — Z86010 Personal history of colonic polyps: Secondary | ICD-10-CM

## 2015-02-12 DIAGNOSIS — K219 Gastro-esophageal reflux disease without esophagitis: Secondary | ICD-10-CM | POA: Diagnosis not present

## 2015-02-12 DIAGNOSIS — E039 Hypothyroidism, unspecified: Secondary | ICD-10-CM | POA: Diagnosis not present

## 2015-02-12 NOTE — Progress Notes (Signed)
Subjective:    Patient ID: [name], female    DOB: [birth year], 81 y.o.   MRN: [id number]  HPI Layce Sprung is an 81 year old female with a past medical history of large right-sided sessile serrated adenoma status post right hemicolectomy in 2013, gastroparesis, GERD, history of fundic gland polyp with low-grade dysplasia status post resection, CIDP, COPD, hypertension, hyperlipidemia, rectocele, urinary retention, polyarthritis who seen for follow-up. She was last seen in the office in July 2015. Thereafter she came for repeat colonoscopy after having a positive Cologuard. This was performed on 02/03/2014. This revealed a normal neoterminal ileum. There was evidence of prior surgical anastomosis in the right colon. The colon mucosa was otherwise normal. She had small external hemorrhoids. No polyps or tumors were seen.  She reports that she has been doing overall fairly well. She is having issues with being cold frequently and having noticed some hair loss. She questions whether her thyroid is "out of whack". From a GI perspective, nausea has been minimal. She does continue on Dexilant and using Zofran on an as-needed basis. She is not taking Reglan. Appetite has been fairly good for her. She is eating 1 or 2 meals per day. She does still use MiraLAX to help induce bowel movement. Though on occasion she has noticed some loose stools occurring postprandially. This is also been associated with mucus in her stool. She denies seeing rectal bleeding or melena. If she becomes constipated she has left-sided abdominal discomfort.  She has been discharged from hematology clinic where she was followed for low iron. She reports hemoglobin and iron studies have been stable   Review of Systems As per history of present illness, otherwise negative  Current Medications, Allergies, Past Medical History, Past Surgical History, Family History and Social History were reviewed in [employer] record.     Objective:   Physical Exam BP 128/80 mmHg  Pulse 84  Ht 5' 1.75" (1.568 m)  Wt 129 lb 8 oz (58.741 kg)  BMI 23.89 kg/m2 Constitutional: Well-developed and well-nourished. No distress. HEENT: Normocephalic and atraumatic. Oropharynx is clear and moist. No oropharyngeal exudate. Conjunctivae are normal.  No scleral icterus. Neck: Neck supple. Trachea midline. Cardiovascular: Normal rate, regular rhythm and intact distal pulses. No M/R/G Pulmonary/chest: Effort normal and breath sounds normal. No wheezing, rales or rhonchi. Abdominal: Soft, nontender, nondistended. Bowel sounds active throughout. There are no masses palpable. No hepatosplenomegaly. Extremities: no clubbing, cyanosis, or edema Lymphadenopathy: No cervical adenopathy noted. Neurological: Alert and oriented to person place and time. Skin: Skin is warm and dry. No rashes noted. Psychiatric: Normal mood and affect. Behavior is normal.  CBC    Component Value Date/Time   WBC 7.5 04/06/2014 0954   WBC 5.9 10/27/2013 1532   RBC 4.32 04/06/2014 0954   RBC 4.48 10/27/2013 1532   RBC 4.68 01/30/2013 0956   HGB 13.3 04/06/2014 0954   HGB 13.5 10/27/2013 1532   HCT 40.7 04/06/2014 0954   HCT 40.2 10/27/2013 1532   PLT 276.0 04/06/2014 0954   PLT 267 10/27/2013 1532   MCV 94.3 04/06/2014 0954   MCV 90 10/27/2013 1532   MCH 30.1 10/27/2013 1532   MCH 29.6 12/06/2012 1909   MCHC 32.6 04/06/2014 0954   MCHC 33.6 10/27/2013 1532   RDW 13.8 04/06/2014 0954   RDW 13.5 10/27/2013 1532   LYMPHSABS 1.7 04/06/2014 0954   LYMPHSABS 2.0 10/27/2013 1532   MONOABS 0.4 04/06/2014 0954   EOSABS 0.2 04/06/2014 0954  EOSABS 0.2 10/27/2013 1532   BASOSABS 0.0 04/06/2014 0954   BASOSABS 0.0 10/27/2013 1532    CMP     Component Value Date/Time   NA 142 04/06/2014 0954   K 4.0 04/06/2014 0954   CL 108 04/06/2014 0954   CO2 24 04/06/2014 0954   GLUCOSE 81 04/06/2014 0954   BUN 16 04/06/2014 0954    CREATININE 0.6 04/06/2014 0954   CREATININE 0.78 06/27/2012 1139   CALCIUM 8.2* 04/06/2014 0954   PROT 7.2 04/06/2014 0954   ALBUMIN 3.5 04/06/2014 0954   AST 28 04/06/2014 0954   ALT 23 04/06/2014 0954   ALKPHOS 75 04/06/2014 0954   BILITOT 0.6 04/06/2014 0954   GFRNONAA 79* 09/03/2013 1600   GFRAA >90 09/03/2013 1600    Iron/TIBC/Ferritin/ %Sat    Component Value Date/Time   IRON 69 10/27/2013 1532   IRON 63 09/25/2012 1424   TIBC 268 10/27/2013 1532   TIBC 296 09/25/2012 1424   FERRITIN 68 10/27/2013 1532   FERRITIN 81 09/25/2012 1424   IRONPCTSAT 26 10/27/2013 1532   IRONPCTSAT 21 09/25/2012 1424      Assessment & Plan:  81 year old female with a past medical history of large right-sided sessile serrated adenoma status post right hemicolectomy in 2013, gastroparesis, GERD, history of fundic gland polyp with low-grade dysplasia status post resection, CIDP, COPD, hypertension, hyperlipidemia, rectocele, urinary retention, polyarthritis who seen for follow-up.  1. GERD/gastroparesis -- she has done very well managing gastroparesis from a diet perspective. She is not requiring Reglan which I think is ideal given her history of neurologic disorder and neuropathy. She is having no heartburn symptoms. We will try discontinuing Dexilant. I'll have her do 60 mg every other day for 1 week and then stop. She is asked to call me if she has return of heartburn or any dysphagia, indigestion, dyspepsia, nausea or vomiting. She voices understanding  2.  History history of colon polyps status post right hemicolectomy -- Cologuard was positive but colonoscopy was negative. We discussed this today. The prep was good and I do not think this test bears repeating at this time. She is in agreement. Will likely not require further colorectal cancer screening given she is now 81 years old  3. Mucus in stool/intermittent loose stool/history of chronic constipation -- at risk for bacterial overgrowth. We  discussed treating with metronidazole 250 3 times a day 7 days empirically for overgrowth. She would like to monitor this for now and let me know if it worsens or continues. She will continue MiraLAX on an as-needed basis given her longer standing chronic constipation  4. Cold intolerance/hair loss/hypothyroidism -- she would like to follow-up with Dr. Cruzita Lederer whom she has seen in the past. I will try to facilitate this appointment  One-year follow-up sooner if necessary 25 minutes spent with the patient today

## 2015-02-12 NOTE — Patient Instructions (Addendum)
Decrease Dexilant to 1 tab every other day x 1 week then discontinue.  We have made you a follow up with Dr Cruzita Lederer for Sept 20 th at 9:15am  If your bowels continue to be irregular, please call the office.

## 2015-02-15 ENCOUNTER — Encounter: Payer: Self-pay | Admitting: Internal Medicine

## 2015-02-15 DIAGNOSIS — Z86010 Personal history of colonic polyps: Secondary | ICD-10-CM | POA: Diagnosis not present

## 2015-02-15 DIAGNOSIS — D241 Benign neoplasm of right breast: Secondary | ICD-10-CM | POA: Diagnosis not present

## 2015-02-16 ENCOUNTER — Ambulatory Visit: Payer: Medicare Other | Admitting: Internal Medicine

## 2015-02-16 ENCOUNTER — Other Ambulatory Visit: Payer: Self-pay | Admitting: Internal Medicine

## 2015-02-16 MED ORDER — DEXTROAMPHETAMINE SULFATE 10 MG PO TABS: 10.0000 mg | ORAL_TABLET | Freq: Four times a day (QID) | ORAL | Status: DC

## 2015-02-17 ENCOUNTER — Ambulatory Visit (INDEPENDENT_AMBULATORY_CARE_PROVIDER_SITE_OTHER): Payer: Medicare Other

## 2015-02-17 DIAGNOSIS — Z23 Encounter for immunization: Secondary | ICD-10-CM

## 2015-02-17 DIAGNOSIS — M810 Age-related osteoporosis without current pathological fracture: Secondary | ICD-10-CM

## 2015-02-17 MED ORDER — INFLUENZA VAC SPLIT QUAD 0.5 ML IM SUSY
0.5000 mL | PREFILLED_SYRINGE | Freq: Once | INTRAMUSCULAR | Status: AC
Start: 2015-02-17 — End: 2015-02-17
  Administered 2015-02-17: 0.5 mL via INTRAMUSCULAR

## 2015-02-17 MED ORDER — DENOSUMAB 60 MG/ML ~~LOC~~ SOLN
60.0000 mg | Freq: Once | SUBCUTANEOUS | Status: AC
  Administered 2015-02-17: 60 mg via SUBCUTANEOUS

## 2015-02-17 MED ORDER — DENOSUMAB 60 MG/ML ~~LOC~~ SOLN: 60.0000 mg | Freq: Once | SUBCUTANEOUS | Status: DC

## 2015-02-18 DIAGNOSIS — J45909 Unspecified asthma, uncomplicated: Secondary | ICD-10-CM | POA: Diagnosis not present

## 2015-02-18 DIAGNOSIS — J449 Chronic obstructive pulmonary disease, unspecified: Secondary | ICD-10-CM | POA: Diagnosis not present

## 2015-02-23 ENCOUNTER — Other Ambulatory Visit: Payer: Self-pay | Admitting: Internal Medicine

## 2015-02-23 NOTE — Telephone Encounter (Signed)
Rx faxed to [facility].  

## 2015-02-23 NOTE — Telephone Encounter (Signed)
Rx printed, awaiting MD signature.  

## 2015-02-23 NOTE — Telephone Encounter (Signed)
Pt is requesting refill on Phenobarbital.  Last OV: 02/12/2015 Last Fill: 08/20/2014 #120 5RF UDS: 01/01/2015 Low risk  Please advise.

## 2015-02-23 NOTE — Telephone Encounter (Signed)
Ok 120 and 6 RF

## 2015-03-03 ENCOUNTER — Telehealth: Payer: Self-pay | Admitting: Internal Medicine

## 2015-03-03 NOTE — Telephone Encounter (Signed)
Spoke with Mikki Santee at [facility], informed that he was able to straighten out with insurance for coverage of medication.

## 2015-03-03 NOTE — Telephone Encounter (Signed)
Caller name: Mikki Santee  Relation to pt: [facility] Call back number: [phone] Pharmacy: [facility] [phone]  Reason for call:  Patient is out of town requesting a refill dextroamphetamine (DEXTROSTAT) 10 MG tablet

## 2015-03-29 ENCOUNTER — Encounter: Payer: Self-pay | Admitting: Internal Medicine

## 2015-03-29 ENCOUNTER — Other Ambulatory Visit: Payer: Self-pay | Admitting: Internal Medicine

## 2015-03-29 MED ORDER — DEXTROAMPHETAMINE SULFATE 10 MG PO TABS: 10.0000 mg | ORAL_TABLET | Freq: Four times a day (QID) | ORAL | Status: DC

## 2015-03-29 NOTE — Telephone Encounter (Signed)
Rx for November and December 2016 printed, awaiting MD signature.

## 2015-03-29 NOTE — Telephone Encounter (Signed)
Pt is requesting refill on Dextrostat.   Last OV: 01/12/2015 Last Fill: 02/16/2015 #120 and 0RF For October 2016 UDS: 01/01/2015 Low risk  Please advise.

## 2015-03-29 NOTE — Telephone Encounter (Signed)
Okay, 2 prescriptions 

## 2015-03-30 ENCOUNTER — Ambulatory Visit (INDEPENDENT_AMBULATORY_CARE_PROVIDER_SITE_OTHER): Payer: Medicare Other | Admitting: Internal Medicine

## 2015-03-30 ENCOUNTER — Encounter: Payer: Self-pay | Admitting: Internal Medicine

## 2015-03-30 VITALS — BP 138/64 | HR 91 | Temp 98.1°F | Resp 14 | Wt 132.6 lb

## 2015-03-30 DIAGNOSIS — E042 Nontoxic multinodular goiter: Secondary | ICD-10-CM

## 2015-03-30 DIAGNOSIS — E039 Hypothyroidism, unspecified: Secondary | ICD-10-CM

## 2015-03-30 LAB — T3, FREE: T3, Free: 3.3 pg/mL (ref 2.3–4.2)

## 2015-03-30 LAB — T4, FREE: Free T4: 0.58 ng/dL — ABNORMAL LOW (ref 0.60–1.60)

## 2015-03-30 LAB — TSH: TSH: 1.27 u[IU]/mL (ref 0.35–4.50)

## 2015-03-30 NOTE — Patient Instructions (Addendum)
Please stop at the lab.  Take the thyroid hormone every day, with water, >30 minutes before breakfast, separated by >4 hours from acid reflux medications, calcium, iron, multivitamins.  Please come back for a follow-up appointment in 6 months.

## 2015-03-30 NOTE — Progress Notes (Signed)
Subjective:     Patient ID: [name], female   DOB: [birth year], 81 y.o.   MRN: [id number]  HPI [name] is a pleasant 81 y.o. woman, returning for investigation for thyroid nodules. Last visit was 07/2012 for fatigue r/o hypothyroidism. She is here with her husband who offers part of the history.  To investigate her fatigue in the past, she had thyroid tests checked, and because her free T4 was slightly low (at 0.69) - [TSH normal (3.257)], she was started on Synthroid 25 mcg daily. On repeat, her TSH remained the same (3.213), however the free T4 normalized (1.14). Since last visit, her dose of levothyroxine was increased to 37.5 g daily.   She does remember that she had an episode of hyperthyroidism in the distant past (when she was in her 41s), which resolved by itself.  She take LT4 ([place] brand) 37.5 mcg: - in am - fasting - with Gatorade G2 - no b'fast - she was on PPI (Dexilant) in am - stopped 2-3 mo ago. - + Ca at night - no Fe - no MVI  Of note, she is on daily Prednisone 20 mg daily (took the dose this am).  Latest TFTs: Lab Results  Component Value Date   TSH 1.03 01/12/2015   TSH 0.977 08/20/2014   TSH 0.95 06/03/2013   TSH 1.05 04/01/2013   TSH 0.872 08/20/2012   TSH 3.213 08/05/2012   FREET4 1.14 08/05/2012   FREET4 0.69* 06/27/2012   She has 3 thyroid nodules per last thyroid U/S from 10/21/2014: Right thyroid lobe: 33 x 18 x 17 mm. Dominant 23 x 17 x 15 mm solid nodule, mid lobe. Medially in adjacent 11 x 8 x 5 mm solid nodule. Left thyroid lobe: 34 x 13 x 12 mm. Solitary 17 x 10 x 12 mm solid nodule, mid lobe. IsthmusThickness: 2 mm. No nodules visualized. Lymphadenopathy: None visualized.  Bx'es of the 2 dominant nodules on 11/19/2014: all benign.  No dysphagia/SOB with lying down/no choking.  Review of Systems Constitutional: no weight gain/loss, +++ fatigue, no subjective hypo- or hyperthermia Eyes: no blurry vision, no xerophthalmia ENT:  no sore throat, no nodules palpated in throat, no dysphagia/odynophagia, no hoarseness Cardiovascular: no CP/SOB/palpitations/+ hand and feet swelling - occasionally Respiratory: no cough/SOB, occasional wheezing Gastrointestinal: no N/V/D/C Musculoskeletal:no muscle/joint aches Skin: no rashes, + hair loss Neurological: no tremors/+ numbness (feet)/no tingling/dizziness  I reviewed pt's medications, allergies, PMH, social hx, family hx, and changes were documented in the history of present illness. She had several sx'es since last visit: cervical fusion 12/2012; finger fusion 2015; breast ductal excision 2015; palmar fasciotomy 2016. Otherwise, unchanged from my initial visit note.  Past Medical History  Diagnosis Date  . Anemia   . Asthma -COPD   . Chronic fatigue syndrome   . GERD (gastroesophageal reflux disease)   . Hyperlipidemia   . Hypertension   . Colon polyp   . Allergic rhinitis   . Vitamin D deficiency   . Pulmonary embolism ([place])     1980s  . Arthritis   . Chronic inflammatory demyelinating polyneuropathy ([place]) 03/2011  . Blood transfusion 1957  . Osteoporosis   . Hypothyroid   . Constipation   . Seizures ([place])     1990 last seizures on meds Phenobarb  . Skin cancer     squamous cell  . Neurogenic bladder 2013    husband caths pt, manages with timed void  . chronic sinusitis 08/08/2012  . Shingles late  90's  . Cystocele 09/16/2012  . Tubular adenoma   . Breast mass     right breast in milk duct, bx neg  . Thyroid nodule     Right thyroid lobe, only seen on Sagittal imaging measures 2.3 cm in craniocaudal dimension and appears stable  . Renal cyst     Non-complex, contrast MRI 2013 with L>R non-complex cysts, dominant 3.7 left lower pole   Past Surgical History  Procedure Laterality Date  . Tonsillectomy and adenoidectomy    . Nasal septum surgery    . Dilation and curettage of uterus    . Tubal ligation    . Cesarean section    . Sinus surgeries      x  4  . Left ovary and tube removed    . Vocal polyps removed    . Appendectomy    . Cystocele repair    . Carpal tunnel release      right  . Shoulder arthroscopy      x2 left, 1 right  . Left finger fusion      3 fingers on left hand/one finger right  . Right median nerve decompression    . Duptyren's contracture right hand    . Finger ganglion cyst excision      right  . Abdominal hysterectomy    . Joint replacement      right and left basal joints of thumbs  . Bladder suspension    . Cataract extraction      bilateral  . Cervical neck ablation      x 7, C3-C6/3 screws and plate  . Squamous lesions removed      neck and face  . Basal cell carcinoma excision      face  . Panniculectomy    . Nasal polyp surgery      4 sinus surgeries  . Anterior fusion clivus-c2 extraoral w/ odontoid excision  8/14  . Proximal interphalangeal fusion (pip) Left 09/09/2013    Procedure: FUSION LEFT INDEX PROXIMAL INTERPHALANGEAL JOINT (PIP);  Surgeon: Cammie Sickle., MD;  Location: [facility];  Service: Orthopedics;  Laterality: Left;  . Breast ductal system excision Right 01/15/2014    Procedure: EXCISION DUCTAL SYSTEM RIGHT BREAST;  Surgeon: Stark Klein, MD;  Location: [place];  Service: General;  Laterality: Right;   History   Social History  . Marital Status: Married    Spouse Name: N/A    Number of Children: 3  . Years of Education: N/A   Occupational History  . Retired Emergency planning/management officer    Social History Main Topics  . Smoking status: Former Smoker -- 0.50 packs/day for 3 years    Types: Cigarettes    Quit date: 05/29/1981  . Smokeless tobacco: Never Used  . Alcohol Use: No  . Drug Use: No  . Sexually Active: Yes   Medication Sig  . amLODipine (NORVASC) 10 MG tablet Take 1 tablet (10 mg total) by mouth every morning.  Marland Kitchen amphetamine-dextroamphetamine (ADDERALL) 10 MG tablet Take 1 tablet (10 mg total) by mouth 4 (four) times daily.  Marland Kitchen  arformoterol (BROVANA) 15 MCG/2ML NEBU Take 2 mLs (15 mcg total) by nebulization 2 (two) times daily.  . budesonide (PULMICORT) 0.25 MG/2ML nebulizer solution Take 2 mLs (0.25 mg total) by nebulization 2 (two) times daily.  Marland Kitchen CALCIUM-VITAMIN D PO Take 1 tablet by mouth daily.   . cetirizine (ZYRTEC) 10 MG tablet Take 10 mg by mouth daily.    Marland Kitchen  dexlansoprazole (DEXILANT) 60 MG capsule Take 1 capsule (60 mg total) by mouth daily.  . diclofenac sodium (VOLTAREN) 1 % GEL Apply 1 application topically 4 (four) times daily as needed. Applies to fingers and hands for gout.  Marland Kitchen HYDROcodone-homatropine (HYCODAN) 5-1.5 MG/5ML syrup Take 5 mLs by mouth as needed. For cough.  Marland Kitchen levofloxacin (LEVAQUIN) 500 MG tablet Take 500 mg by mouth daily.  Marland Kitchen levothyroxine (SYNTHROID, LEVOTHROID) 25 MCG tablet Take 1 tablet (25 mcg total) by mouth daily.  Marland Kitchen lidocaine (LIDODERM) 5 % Place 2 patches onto the skin every morning. Remove & Discard patch within 12 hours or as directed by MD. Applies to back and neck.  . mometasone (NASONEX) 50 MCG/ACT nasal spray Place 2 sprays into the nose daily.  . montelukast (SINGULAIR) 10 MG tablet Take 10 mg by mouth at bedtime.  . ondansetron (ZOFRAN-ODT) 4 MG disintegrating tablet Take 1 tablet (4 mg total) by mouth every 8 (eight) hours as needed for nausea.  Marland Kitchen oxyCODONE-acetaminophen (PERCOCET) 10-325 MG per tablet Take 1 tablet by mouth every 4 (four) hours as needed for pain. For breakthrough pain. Stated she hasn't had to use since starting on Oxycontin 12h  . oxymorphone (OPANA ER) 20 MG 12 hr tablet Take 20 mg by mouth as needed.   Marland Kitchen PHENobarbital (LUMINAL) 32.4 MG tablet Take 1-2 tablets (32.4-64.8 mg total) by mouth 4 (four) times daily -  with meals and at bedtime. She takes one tablet at 6 am, one tablet at noon and two tabletss at 8 pm.  . polyethylene glycol (MIRALAX / GLYCOLAX) packet Take 17 g by mouth at bedtime.  . predniSONE (DELTASONE) 20 MG tablet Take 20 mg by mouth  daily.  . simvastatin (ZOCOR) 40 MG tablet Take 1 tablet (40 mg total) by mouth at bedtime.  . theophylline (THEODUR) 200 MG 12 hr tablet Two by mouth twice a day  . Vitamin D, Ergocalciferol, (DRISDOL) 50000 UNITS CAPS TAKE 1 CAPSULE EVERY WEEK   No current facility-administered medications on file prior to visit.   Allergies  Allergen Reactions  . Iodine Other (See Comments)    Cardiac arrest  . Morphine And Related Other (See Comments)    Cardiac arrest   Family History  Problem Relation Age of Onset  . Heart disease Mother   . Hyperlipidemia Mother     Jerilynn Mages, aunt  . Ovarian cancer Sister   . Lung cancer Sister     lung - stage 1  . Thyroid cancer Sister   . Colon cancer Father     43  . Stomach cancer Other     first cousin  . Skin cancer Daughter   . Malignant hyperthermia Cousin   . Breast cancer Neg Hx    Objective:   Physical Exam BP 138/64 mmHg  Pulse 91  Temp(Src) 98.1 F (36.7 C) (Oral)  Resp 14  Wt 132 lb 9.6 oz (60.147 kg)  SpO2 98% Body mass index is 24.46 kg/(m^2). Wt Readings from Last 3 Encounters:  03/30/15 132 lb 9.6 oz (60.147 kg)  02/12/15 129 lb 8 oz (58.741 kg)  01/12/15 130 lb 6 oz (59.138 kg)   Constitutional: normal weight, in NAD, walks with help from husband or with walker Eyes: PERRLA, EOMI, no exophthalmos ENT: drymucous membranes, no thyromegaly, no cervical lymphadenopathy Cardiovascular: Tachycardia, RR, No MRG Respiratory: CTA B Gastrointestinal: abdomen soft, NT, ND, BS+ Musculoskeletal: finger deformities - Dupuytren contracture in several fingers, immobile joints do to fusion, strength intact  in all 4 Skin: moist, warm, no rashes Neurological: no tremor with outstretched hands, DTR 0/4  Assessment:     1. Very mild hypothyroidism  - on LT4 25  2. Thyroid nodules - 3 - 2 dominant nodules benign    Plan:     1. Mild hypothyroidism - Patient has a history of very mild hypothyroidism, but she was tried on levothyroxine to  see if this improved her significant fatigue. She had extensive investigation for her fatigue in the past (also review the last visit note from 07/2012) with essentially negative workup. - She is doing well on levothyroxine 37.5 g daily, however, she was not taking it correctly for a long time, since she took levothyroxine along with PPI in a.m. She stopped Dexilant 2 months ago. - We discussed about the fact that she needs to start taking the thyroid hormone correctly: every day, with water, >30 minutes before breakfast, separated by >4 hours from acid reflux medications, calcium, iron, multivitamins. - We will recheck her TFTs today, I will also add thyroid antibodies: TPO and ATA to look for Hashimoto thyroiditis. - Return in about 6 months (around 09/27/2015).  2. Thyroid nodules - No neck compression symptoms   - It is reassuring that the dominant 2 nodules were biopsied and the biopsy is benign - We will keep an eye on her nodules and we may need to repeat the thyroid ultrasound in 2-5 years from now  Office Visit on 03/30/2015  Component Date Value Ref Range Status  . Free T4 03/30/2015 0.58* 0.60 - 1.60 ng/dL Final  . T3, Free 03/30/2015 3.3  2.3 - 4.2 pg/mL Final  . TSH 03/30/2015 1.27  0.35 - 4.50 uIU/mL Final   Free T4 is slightly low, however, this is not unusual with phenobarbital and especially carbamazepine (see below study).  Seizure. 2016 Feb;35:72-9. doi: [email or web address]/[email or web address]. Epub 2016 Jan 14.  Effects of antiepileptic drug on thyroid hormones in patients with epilepsy: A meta-analysis. Tanna Savoy, Shen CH1, Kenney Houseman, Fang GL1, Ming WJ1, Derry Lory, Ding MP2.  I suggest to continue the same dose of levothyroxine for now, as the TSH is perfect.  The thyroid antibodies are pending.

## 2015-03-31 LAB — THYROID PEROXIDASE ANTIBODY: Thyroperoxidase Ab SerPl-aCnc: <1 IU/mL (ref <9)

## 2015-03-31 LAB — THYROGLOBULIN ANTIBODY: Thyroglobulin Ab: <1 IU/mL (ref <2)

## 2015-04-01 DIAGNOSIS — H40013 Open angle with borderline findings, low risk, bilateral: Secondary | ICD-10-CM | POA: Diagnosis not present

## 2015-04-01 DIAGNOSIS — H10413 Chronic giant papillary conjunctivitis, bilateral: Secondary | ICD-10-CM | POA: Diagnosis not present

## 2015-04-01 DIAGNOSIS — H04123 Dry eye syndrome of bilateral lacrimal glands: Secondary | ICD-10-CM | POA: Diagnosis not present

## 2015-04-01 DIAGNOSIS — H02055 Trichiasis without entropian left lower eyelid: Secondary | ICD-10-CM | POA: Diagnosis not present

## 2015-04-06 DIAGNOSIS — C44622 Squamous cell carcinoma of skin of right upper limb, including shoulder: Secondary | ICD-10-CM | POA: Diagnosis not present

## 2015-04-13 DIAGNOSIS — M1812 Unilateral primary osteoarthritis of first carpometacarpal joint, left hand: Secondary | ICD-10-CM | POA: Diagnosis not present

## 2015-04-13 DIAGNOSIS — M19041 Primary osteoarthritis, right hand: Secondary | ICD-10-CM | POA: Diagnosis not present

## 2015-04-13 DIAGNOSIS — M19032 Primary osteoarthritis, left wrist: Secondary | ICD-10-CM | POA: Diagnosis not present

## 2015-04-24 ENCOUNTER — Encounter: Payer: Self-pay | Admitting: Internal Medicine

## 2015-04-26 ENCOUNTER — Other Ambulatory Visit: Payer: Self-pay

## 2015-04-26 ENCOUNTER — Telehealth: Payer: Self-pay | Admitting: Internal Medicine

## 2015-04-26 MED ORDER — DEXTROAMPHETAMINE SULFATE 10 MG PO TABS: 10.0000 mg | ORAL_TABLET | Freq: Four times a day (QID) | ORAL | Status: DC

## 2015-04-26 NOTE — Telephone Encounter (Signed)
Rx printed, awaiting MD signature.  

## 2015-04-26 NOTE — Telephone Encounter (Signed)
Please print a prescription for #120 for December

## 2015-04-27 ENCOUNTER — Telehealth: Payer: Self-pay | Admitting: Internal Medicine

## 2015-04-27 MED ORDER — PHENOBARBITAL 32.4 MG PO TABS: ORAL_TABLET | ORAL | Status: DC

## 2015-04-27 NOTE — Telephone Encounter (Signed)
Okay #120, 5 refills

## 2015-04-27 NOTE — Addendum Note (Signed)
Addended by: Kelle Darting A on: 04/27/2015 02:06 PM   Modules accepted: Orders

## 2015-04-27 NOTE — Telephone Encounter (Signed)
Rx placed at front desk for pick up. Left message for pt's spouse to return my call.

## 2015-04-27 NOTE — Telephone Encounter (Signed)
Phenobarbital was refilled on 02/23/2015 #120 tablets and 5 refills.

## 2015-04-27 NOTE — Telephone Encounter (Signed)
Caller name: Self   Can be reached: [phone]   Reason for call: Need prescription for PHENobarbital (LUMINAL) 32.4 MG tablet [id number] States that she needs this one.

## 2015-04-27 NOTE — Telephone Encounter (Signed)
Caller name:RICHARD Relationship to patient:SPOUSE  Can be [phone] Pharmacy:[facility] [phone]  Reason for call:PHENOBARBITAL 32.4MG    SHE HAD 5 REFILLS WHEN THEY WENT TO [place] THEY HAD A PHARMACY CALL AND TRANSFER THE RX TO THEM IN [place].    THIS CAN ONLY BE DONE BY THE PHARMACY ONCE.  IT HAS TO BE REISSUED BY DR PAZ.   SHE USES THIS FOR SEIZURES 4 TIMES DAILY AND SHE IS ALMOST OUT OF THE MED

## 2015-04-27 NOTE — Telephone Encounter (Signed)
Rx printed and forwarded to Provider for signature.

## 2015-04-30 ENCOUNTER — Encounter (HOSPITAL_BASED_OUTPATIENT_CLINIC_OR_DEPARTMENT_OTHER): Payer: Self-pay | Admitting: *Deleted

## 2015-05-03 ENCOUNTER — Encounter (HOSPITAL_BASED_OUTPATIENT_CLINIC_OR_DEPARTMENT_OTHER)
Admission: RE | Admit: 2015-05-03 | Discharge: 2015-05-03 | Disposition: A | Discharge status: Home / Self Care | Payer: Medicare Other | Source: Ambulatory Visit | Attending: Orthopedic Surgery | Admitting: Orthopedic Surgery

## 2015-05-03 ENCOUNTER — Other Ambulatory Visit: Payer: Self-pay | Admitting: Orthopedic Surgery

## 2015-05-03 ENCOUNTER — Encounter: Payer: Self-pay | Admitting: Internal Medicine

## 2015-05-03 DIAGNOSIS — N319 Neuromuscular dysfunction of bladder, unspecified: Secondary | ICD-10-CM | POA: Diagnosis not present

## 2015-05-03 DIAGNOSIS — E785 Hyperlipidemia, unspecified: Secondary | ICD-10-CM | POA: Diagnosis not present

## 2015-05-03 DIAGNOSIS — Z7952 Long term (current) use of systemic steroids: Secondary | ICD-10-CM | POA: Diagnosis not present

## 2015-05-03 DIAGNOSIS — K219 Gastro-esophageal reflux disease without esophagitis: Secondary | ICD-10-CM | POA: Diagnosis not present

## 2015-05-03 DIAGNOSIS — J449 Chronic obstructive pulmonary disease, unspecified: Secondary | ICD-10-CM | POA: Diagnosis not present

## 2015-05-03 DIAGNOSIS — E559 Vitamin D deficiency, unspecified: Secondary | ICD-10-CM | POA: Diagnosis not present

## 2015-05-03 DIAGNOSIS — Z86711 Personal history of pulmonary embolism: Secondary | ICD-10-CM | POA: Diagnosis not present

## 2015-05-03 DIAGNOSIS — E039 Hypothyroidism, unspecified: Secondary | ICD-10-CM | POA: Diagnosis not present

## 2015-05-03 DIAGNOSIS — I10 Essential (primary) hypertension: Secondary | ICD-10-CM | POA: Diagnosis not present

## 2015-05-03 DIAGNOSIS — M19041 Primary osteoarthritis, right hand: Secondary | ICD-10-CM | POA: Diagnosis not present

## 2015-05-03 DIAGNOSIS — Z7951 Long term (current) use of inhaled steroids: Secondary | ICD-10-CM | POA: Diagnosis not present

## 2015-05-03 DIAGNOSIS — Z87891 Personal history of nicotine dependence: Secondary | ICD-10-CM | POA: Diagnosis not present

## 2015-05-03 DIAGNOSIS — Z79899 Other long term (current) drug therapy: Secondary | ICD-10-CM | POA: Diagnosis not present

## 2015-05-03 DIAGNOSIS — J45909 Unspecified asthma, uncomplicated: Secondary | ICD-10-CM | POA: Diagnosis not present

## 2015-05-03 DIAGNOSIS — Z7983 Long term (current) use of bisphosphonates: Secondary | ICD-10-CM | POA: Diagnosis not present

## 2015-05-03 LAB — BASIC METABOLIC PANEL
Anion gap: 6 (ref 5–15)
BUN: 19 mg/dL (ref 6–20)
CO2: 29 mmol/L (ref 22–32)
Calcium: 9.1 mg/dL (ref 8.9–10.3)
Chloride: 106 mmol/L (ref 101–111)
Creatinine, Ser: 0.71 mg/dL (ref 0.44–1.00)
GFR calc Af Amer: >60 mL/min (ref >60)
GFR calc non Af Amer: >60 mL/min (ref >60)
Glucose, Bld: 89 mg/dL (ref 65–99)
Potassium: 4.7 mmol/L (ref 3.5–5.1)
Sodium: 141 mmol/L (ref 135–145)

## 2015-05-06 ENCOUNTER — Encounter (HOSPITAL_BASED_OUTPATIENT_CLINIC_OR_DEPARTMENT_OTHER): Payer: Self-pay | Admitting: *Deleted

## 2015-05-06 ENCOUNTER — Ambulatory Visit (HOSPITAL_BASED_OUTPATIENT_CLINIC_OR_DEPARTMENT_OTHER): Payer: Medicare Other | Admitting: Certified Registered"

## 2015-05-06 ENCOUNTER — Ambulatory Visit (HOSPITAL_BASED_OUTPATIENT_CLINIC_OR_DEPARTMENT_OTHER)
Admission: RE | Admit: 2015-05-06 | Discharge: 2015-05-06 | Disposition: A | Discharge status: Home / Self Care | Payer: Medicare Other | Source: Ambulatory Visit | Attending: Orthopedic Surgery | Admitting: Orthopedic Surgery

## 2015-05-06 ENCOUNTER — Encounter (HOSPITAL_BASED_OUTPATIENT_CLINIC_OR_DEPARTMENT_OTHER)
Admission: RE | Disposition: A | Discharge status: Home / Self Care | Payer: Self-pay | Source: Ambulatory Visit | Attending: Orthopedic Surgery

## 2015-05-06 DIAGNOSIS — Z87891 Personal history of nicotine dependence: Secondary | ICD-10-CM | POA: Insufficient documentation

## 2015-05-06 DIAGNOSIS — Z7951 Long term (current) use of inhaled steroids: Secondary | ICD-10-CM | POA: Insufficient documentation

## 2015-05-06 DIAGNOSIS — M19041 Primary osteoarthritis, right hand: Secondary | ICD-10-CM | POA: Insufficient documentation

## 2015-05-06 DIAGNOSIS — K219 Gastro-esophageal reflux disease without esophagitis: Secondary | ICD-10-CM | POA: Insufficient documentation

## 2015-05-06 DIAGNOSIS — J449 Chronic obstructive pulmonary disease, unspecified: Secondary | ICD-10-CM | POA: Insufficient documentation

## 2015-05-06 DIAGNOSIS — J45909 Unspecified asthma, uncomplicated: Secondary | ICD-10-CM | POA: Insufficient documentation

## 2015-05-06 DIAGNOSIS — Z7952 Long term (current) use of systemic steroids: Secondary | ICD-10-CM | POA: Insufficient documentation

## 2015-05-06 DIAGNOSIS — Z86711 Personal history of pulmonary embolism: Secondary | ICD-10-CM | POA: Insufficient documentation

## 2015-05-06 DIAGNOSIS — E039 Hypothyroidism, unspecified: Secondary | ICD-10-CM | POA: Insufficient documentation

## 2015-05-06 DIAGNOSIS — N319 Neuromuscular dysfunction of bladder, unspecified: Secondary | ICD-10-CM | POA: Insufficient documentation

## 2015-05-06 DIAGNOSIS — E559 Vitamin D deficiency, unspecified: Secondary | ICD-10-CM | POA: Insufficient documentation

## 2015-05-06 DIAGNOSIS — Z79899 Other long term (current) drug therapy: Secondary | ICD-10-CM | POA: Insufficient documentation

## 2015-05-06 DIAGNOSIS — I10 Essential (primary) hypertension: Secondary | ICD-10-CM | POA: Insufficient documentation

## 2015-05-06 DIAGNOSIS — E785 Hyperlipidemia, unspecified: Secondary | ICD-10-CM | POA: Insufficient documentation

## 2015-05-06 DIAGNOSIS — Z7983 Long term (current) use of bisphosphonates: Secondary | ICD-10-CM | POA: Insufficient documentation

## 2015-05-06 HISTORY — PX: STERIOD INJECTION: SHX5046

## 2015-05-06 HISTORY — PX: FINGER ARTHRODESIS: SHX5000

## 2015-05-06 SURGERY — FUSION, JOINT, FINGER
Anesthesia: Monitor Anesthesia Care | Site: Finger | Laterality: Right

## 2015-05-06 MED ORDER — LACTATED RINGERS IV SOLN
INTRAVENOUS | Status: DC
  Administered 2015-05-06 (×2): 10 mL/h via INTRAVENOUS

## 2015-05-06 MED ORDER — GLYCOPYRROLATE 0.2 MG/ML IJ SOLN: 0.2000 mg | Freq: Once | INTRAMUSCULAR | Status: DC | PRN

## 2015-05-06 MED ORDER — DEXAMETHASONE SODIUM PHOSPHATE 10 MG/ML IJ SOLN
INTRAMUSCULAR | Status: AC
  Filled 2015-05-06: qty 1

## 2015-05-06 MED ORDER — CHLORHEXIDINE GLUCONATE 4 % EX LIQD: 60.0000 mL | Freq: Once | CUTANEOUS | Status: DC

## 2015-05-06 MED ORDER — CEFAZOLIN SODIUM-DEXTROSE 2-3 GM-% IV SOLR
INTRAVENOUS | Status: AC
  Filled 2015-05-06: qty 50

## 2015-05-06 MED ORDER — LIDOCAINE HCL (CARDIAC) 20 MG/ML IV SOLN
INTRAVENOUS | Status: AC
  Filled 2015-05-06: qty 5

## 2015-05-06 MED ORDER — FENTANYL CITRATE (PF) 100 MCG/2ML IJ SOLN
INTRAMUSCULAR | Status: AC
  Filled 2015-05-06: qty 2

## 2015-05-06 MED ORDER — LIDOCAINE HCL (PF) 1 % IJ SOLN
INTRAMUSCULAR | Status: DC | PRN
  Administered 2015-05-06: .5 mL

## 2015-05-06 MED ORDER — PROPOFOL 500 MG/50ML IV EMUL
INTRAVENOUS | Status: DC | PRN
  Administered 2015-05-06: 100 ug/kg/min via INTRAVENOUS

## 2015-05-06 MED ORDER — CEFAZOLIN SODIUM-DEXTROSE 2-3 GM-% IV SOLR
2.0000 g | INTRAVENOUS | Status: AC
  Administered 2015-05-06: 2 g via INTRAVENOUS

## 2015-05-06 MED ORDER — ONDANSETRON HCL 4 MG/2ML IJ SOLN
INTRAMUSCULAR | Status: AC
  Filled 2015-05-06: qty 2

## 2015-05-06 MED ORDER — LIDOCAINE HCL (CARDIAC) 20 MG/ML IV SOLN
INTRAVENOUS | Status: DC | PRN
  Administered 2015-05-06: 25 mg via INTRAVENOUS

## 2015-05-06 MED ORDER — SCOPOLAMINE 1 MG/3DAYS TD PT72: 1.0000 | MEDICATED_PATCH | Freq: Once | TRANSDERMAL | Status: DC | PRN

## 2015-05-06 MED ORDER — BETAMETHASONE SOD PHOS & ACET 6 (3-3) MG/ML IJ SUSP
INTRAMUSCULAR | Status: AC
  Filled 2015-05-06: qty 1

## 2015-05-06 MED ORDER — BUPIVACAINE-EPINEPHRINE (PF) 0.5% -1:200000 IJ SOLN
INTRAMUSCULAR | Status: DC | PRN
  Administered 2015-05-06: 25 mL

## 2015-05-06 MED ORDER — BETAMETHASONE SOD PHOS & ACET 6 (3-3) MG/ML IJ SUSP
12.0000 mg | INTRAMUSCULAR | Status: DC
  Filled 2015-05-06 (×2): qty 2

## 2015-05-06 MED ORDER — MIDAZOLAM HCL 2 MG/2ML IJ SOLN: 1.0000 mg | INTRAMUSCULAR | Status: DC | PRN

## 2015-05-06 MED ORDER — PROPOFOL 10 MG/ML IV BOLUS
INTRAVENOUS | Status: DC | PRN
  Administered 2015-05-06: 30 mg via INTRAVENOUS

## 2015-05-06 MED ORDER — BETAMETHASONE SOD PHOS & ACET 6 (3-3) MG/ML IJ SUSP
INTRAMUSCULAR | Status: DC | PRN
  Administered 2015-05-06: .5 mL via INTRA_ARTICULAR

## 2015-05-06 MED ORDER — FENTANYL CITRATE (PF) 100 MCG/2ML IJ SOLN
50.0000 ug | INTRAMUSCULAR | Status: DC | PRN
  Administered 2015-05-06: 50 ug via INTRAVENOUS

## 2015-05-06 MED ORDER — LIDOCAINE HCL (PF) 1 % IJ SOLN
INTRAMUSCULAR | Status: AC
  Filled 2015-05-06: qty 30

## 2015-05-06 SURGICAL SUPPLY — 61 items
BANDAGE ELASTIC 3 VELCRO ST LF (GAUZE/BANDAGES/DRESSINGS) ×2 IMPLANT
BLADE MINI RND TIP GREEN BEAV (BLADE) ×2 IMPLANT
BLADE SURG 15 STRL LF DISP TIS (BLADE) ×2 IMPLANT
BLADE SURG 15 STRL SS (BLADE) ×4
BNDG CMPR 9X4 STRL LF SNTH (GAUZE/BANDAGES/DRESSINGS) ×1
BNDG CMPR MD 5X2 ELC HKLP STRL (GAUZE/BANDAGES/DRESSINGS)
BNDG ELASTIC 2 VLCR STRL LF (GAUZE/BANDAGES/DRESSINGS) IMPLANT
BNDG ESMARK 4X9 LF (GAUZE/BANDAGES/DRESSINGS) ×2 IMPLANT
BNDG GAUZE ELAST 4 BULKY (GAUZE/BANDAGES/DRESSINGS) ×2 IMPLANT
BUR FAST CUTTING MED (BURR) IMPLANT
CHLORAPREP W/TINT 26ML (MISCELLANEOUS) ×2 IMPLANT
CORDS BIPOLAR (ELECTRODE) ×2 IMPLANT
COVER BACK TABLE 60X90IN (DRAPES) ×2 IMPLANT
COVER MAYO STAND STRL (DRAPES) ×2 IMPLANT
CUFF TOURNIQUET SINGLE 18IN (TOURNIQUET CUFF) ×2 IMPLANT
DRAPE EXTREMITY T 121X128X90 (DRAPE) ×2 IMPLANT
DRAPE OEC MINIVIEW 54X84 (DRAPES) ×2 IMPLANT
DRAPE SURG 17X23 STRL (DRAPES) ×2 IMPLANT
GAUZE SPONGE 4X4 12PLY STRL (GAUZE/BANDAGES/DRESSINGS) ×2 IMPLANT
GAUZE XEROFORM 1X8 LF (GAUZE/BANDAGES/DRESSINGS) ×2 IMPLANT
GLOVE BIO SURGEON STRL SZ7.5 (GLOVE) ×2 IMPLANT
GLOVE BIOGEL PI IND STRL 7.0 (GLOVE) IMPLANT
GLOVE BIOGEL PI IND STRL 8 (GLOVE) ×1 IMPLANT
GLOVE BIOGEL PI IND STRL 8.5 (GLOVE) ×1 IMPLANT
GLOVE BIOGEL PI INDICATOR 7.0 (GLOVE) ×2
GLOVE BIOGEL PI INDICATOR 8 (GLOVE) ×1
GLOVE BIOGEL PI INDICATOR 8.5 (GLOVE) ×1
GLOVE ECLIPSE 7.0 STRL STRAW (GLOVE) ×1 IMPLANT
GLOVE SURG ORTHO 8.0 STRL STRW (GLOVE) ×1 IMPLANT
GLOVE SURG SS PI 8.5 STRL IVOR (GLOVE) ×1
GLOVE SURG SS PI 8.5 STRL STRW (GLOVE) IMPLANT
GOWN STRL REUS W/ TWL LRG LVL3 (GOWN DISPOSABLE) ×1 IMPLANT
GOWN STRL REUS W/TWL LRG LVL3 (GOWN DISPOSABLE) ×2
GOWN STRL REUS W/TWL XL LVL3 (GOWN DISPOSABLE) ×2 IMPLANT
K-WIRE 0.35X4 (WIRE) ×6
KWIRE 0.35X4 (WIRE) IMPLANT
NDL HYPO 25X1 1.5 SAFETY (NEEDLE) IMPLANT
NDL SAFETY ECLIPSE 18X1.5 (NEEDLE) IMPLANT
NEEDLE HYPO 18GX1.5 SHARP (NEEDLE) ×2
NEEDLE HYPO 25X1 1.5 SAFETY (NEEDLE) ×2 IMPLANT
NS IRRIG 1000ML POUR BTL (IV SOLUTION) ×2 IMPLANT
PACK BASIN DAY SURGERY FS (CUSTOM PROCEDURE TRAY) ×2 IMPLANT
PAD CAST 3X4 CTTN HI CHSV (CAST SUPPLIES) ×1 IMPLANT
PADDING CAST ABS 3INX4YD NS (CAST SUPPLIES) ×1
PADDING CAST ABS 4INX4YD NS (CAST SUPPLIES) ×1
PADDING CAST ABS COTTON 3X4 (CAST SUPPLIES) IMPLANT
PADDING CAST ABS COTTON 4X4 ST (CAST SUPPLIES) ×1 IMPLANT
PADDING CAST COTTON 3X4 STRL (CAST SUPPLIES) ×2
SCREW LAG CANNULATED 2.0X20 (Screw) ×1 IMPLANT
SCREW LAG CANNULATED 2.0X28MM (Screw) ×1 IMPLANT
SLEEVE SCD COMPRESS KNEE MED (MISCELLANEOUS) ×1 IMPLANT
SPLINT PLASTER CAST XFAST 3X15 (CAST SUPPLIES) IMPLANT
SPLINT PLASTER XTRA FASTSET 3X (CAST SUPPLIES) ×20
STOCKINETTE 4X48 STRL (DRAPES) ×2 IMPLANT
SUT ETHILON 3 0 PS 1 (SUTURE) IMPLANT
SUT ETHILON 4 0 PS 2 18 (SUTURE) ×1 IMPLANT
SUT MERSILENE 4 0 P 3 (SUTURE) ×1 IMPLANT
SYR BULB 3OZ (MISCELLANEOUS) ×2 IMPLANT
SYR CONTROL 10ML LL (SYRINGE) ×1 IMPLANT
TOWEL OR 17X24 6PK STRL BLUE (TOWEL DISPOSABLE) ×2 IMPLANT
UNDERPAD 30X30 (UNDERPADS AND DIAPERS) ×2 IMPLANT

## 2015-05-06 NOTE — Op Note (Signed)
Surgical Assistant Note  I assisted on procedure done by: Leanora Cover, MD  Procedure: Arthrodesis PIP joint Right Ring Finger on 05/06/2015  I performed retraction stabilization during the insertion of an intramedullary screw and bone grafting

## 2015-05-06 NOTE — Addendum Note (Signed)
Addendum  created 05/06/15 1517 by Lauretta Grill, MD   Modules edited: Orders, PRL Based Order Sets

## 2015-05-06 NOTE — Anesthesia Postprocedure Evaluation (Signed)
Anesthesia Post Note  Patient: FRADEL PRIDEAUX  Procedure(s) Performed: Procedure(s) (LRB): RIGHT RING PROXIMAL INTERPHALANGEAL FUSION (PIP) (Right) STEROID INJECTION (Right)  Patient location during evaluation: PACU Anesthesia Type: MAC and Regional Level of consciousness: awake and alert Pain management: pain level controlled Vital Signs Assessment: post-procedure vital signs reviewed and stable Respiratory status: spontaneous breathing, nonlabored ventilation, respiratory function stable and patient connected to nasal cannula oxygen Cardiovascular status: blood pressure returned to baseline and stable Postop Assessment: no signs of nausea or vomiting Anesthetic complications: no    Last Vitals:  Filed Vitals:   05/06/15 1125 05/06/15 1428  BP:  142/77  Pulse: 93 87  Temp:    Resp: 15 17    Last Pain:  Filed Vitals:   05/06/15 1434  PainSc: 5                  Andora Krull JENNETTE

## 2015-05-06 NOTE — Transfer of Care (Signed)
Immediate Anesthesia Transfer of Care Note  Patient: [name]  Procedure(s) Performed: Procedure(s) with comments: RIGHT RING PROXIMAL INTERPHALANGEAL FUSION (PIP) (Right) STEROID INJECTION (Right) - Right Index Finger Proximal InterPhalangeal Joint Injection  Patient Location: PACU  Anesthesia Type:MAC and Bier block  Level of Consciousness: awake, alert  and oriented  Airway & Oxygen Therapy: Patient Spontanous Breathing and Patient connected to face mask oxygen  Post-op Assessment: Report given to RN, Post -op Vital signs reviewed and stable and Patient moving all extremities  Post vital signs: Reviewed and stable  Last Vitals:  Filed Vitals:   05/06/15 1120 05/06/15 1125  BP:    Pulse: 91 93  Temp:    Resp: 15 15    Complications: No apparent anesthesia complications

## 2015-05-06 NOTE — Brief Op Note (Signed)
05/06/2015  2:29 PM  PATIENT:  Bruna Potter  81 y.o. 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  PROCEDURE:  Procedure(s) with comments: RIGHT RING PROXIMAL INTERPHALANGEAL FUSION (PIP) (Right) STEROID INJECTION (Right) - Right Index Finger Proximal InterPhalangeal Joint Injection  SURGEON:  Surgeon(s) and Role:    * Leanora Cover, MD - Primary    * Daryll Brod, MD - Assisting  PHYSICIAN ASSISTANT:   ASSISTANTS: Daryll Brod, MD   ANESTHESIA:   regional and IV sedation  EBL:  Total I/O In: 700 [I.V.:700] Out: -   BLOOD ADMINISTERED:none  DRAINS: none   LOCAL MEDICATIONS USED:  NONE  SPECIMEN:  No Specimen  DISPOSITION OF SPECIMEN:  N/A  COUNTS:  YES  TOURNIQUET:   Total Tourniquet Time Documented: Upper Arm (Right) - 49 minutes Total: Upper Arm (Right) - 49 minutes   DICTATION: .Other Dictation: Dictation Number [id number]  PLAN OF CARE: Discharge to home after PACU  PATIENT DISPOSITION:  PACU - hemodynamically stable.

## 2015-05-06 NOTE — Progress Notes (Signed)
Assisted Dr. M. Judd with right, ultrasound guided, supraclavicular block. Side rails up, monitors on throughout procedure. See vital signs in flow sheet. Tolerated Procedure well. 

## 2015-05-06 NOTE — Anesthesia Preprocedure Evaluation (Addendum)
Anesthesia Evaluation  Patient identified by MRN, date of birth, ID band Patient awake    Reviewed: Allergy & Precautions, NPO status , Patient's Chart, lab work & pertinent test results  History of Anesthesia Complications Negative for: history of anesthetic complications  Airway Mallampati: III  TM Distance: >3 FB Neck ROM: Full    Dental no notable dental hx. (+) Dental Advisory Given   Pulmonary asthma , COPD, former smoker,    Pulmonary exam normal breath sounds clear to auscultation       Cardiovascular hypertension, negative cardio ROS Normal cardiovascular exam Rhythm:Regular Rate:Normal     Neuro/Psych Seizures -,  PSYCHIATRIC DISORDERS Anxiety Depression negative neurological ROS  negative psych ROS   GI/Hepatic Neg liver ROS, GERD  Medicated and Controlled,  Endo/Other  Hypothyroidism   Renal/GU negative Renal ROS  negative genitourinary   Musculoskeletal negative musculoskeletal ROS (+) Arthritis ,   Abdominal   Peds negative pediatric ROS (+)  Hematology  (+) anemia ,   Anesthesia Other Findings   Reproductive/Obstetrics negative OB ROS                           Anesthesia Physical Anesthesia Plan  ASA: II  Anesthesia Plan: Regional and MAC   Post-op Pain Management: MAC Combined w/ Regional for Post-op pain   Induction: Intravenous  Airway Management Planned: Nasal Cannula  Additional Equipment:   Intra-op Plan:   Post-operative Plan: Extubation in OR  Informed Consent: I have reviewed the patients History and Physical, chart, labs and discussed the procedure including the risks, benefits and alternatives for the proposed anesthesia with the patient or authorized representative who has indicated his/her understanding and acceptance.   Dental advisory given  Plan Discussed with: CRNA  Anesthesia Plan Comments:         Anesthesia Quick Evaluation

## 2015-05-06 NOTE — Op Note (Signed)
[id number] 

## 2015-05-06 NOTE — Anesthesia Procedure Notes (Addendum)
Procedure Name: MAC Date/Time: 05/06/2015 1:20 PM Performed by: Baxter Flattery Pre-anesthesia Checklist: Patient identified, Emergency Drugs available, Suction available and Patient being monitored Patient Re-evaluated:Patient Re-evaluated prior to inductionOxygen Delivery Method: Simple face mask Preoxygenation: Pre-oxygenation with 100% oxygen Intubation Type: IV induction Ventilation: Mask ventilation without difficulty Dental Injury: Teeth and Oropharynx as per pre-operative assessment    Anesthesia Regional Block:  Supraclavicular block  Pre-Anesthetic Checklist: ,, timeout performed, Correct Patient, Correct Site, Correct Laterality, Correct Procedure, Correct Position, site marked, Risks and benefits discussed,  Surgical consent,  Pre-op evaluation,  At surgeon's request and post-op pain management  Laterality: Right  Prep: chloraprep       Needles:  Injection technique: Single-shot  Needle Type: Echogenic Stimulator Needle      Needle Gauge: 21 and 21 G    Additional Needles:  Procedures: ultrasound guided (picture in chart) Supraclavicular block Narrative:  Injection made incrementally with aspirations every 5 mL.  Performed by: Personally  Anesthesiologist: Kaitlyn Skowron, Stanton Kidney  Additional Notes: Patient tolerated the procedure well without complications

## 2015-05-06 NOTE — Op Note (Signed)
Intra-operative fluoroscopic images in the AP, lateral, and oblique views were taken and evaluated by myself.  Reduction and hardware placement were confirmed.  There was no intraarticular penetration of permanent hardware.  

## 2015-05-06 NOTE — H&P (Signed)
[name] is an 81 y.o. female.   Chief Complaint: right ring finger pip athritis HPI: [name] is an 81 year old right hand dominant female with previous patient of Dr. Daylene Katayama seen last in July 2015.  She has pain in the right ring finger PIP joint for many months as well as left wrist pain. These have both bothering her for approximately five months. The right ring finger she does not remember a specific injury. This has been worsening over that time. She has been using Voltaren gel with some help. She has had an injection in July which was helpful. She has not tried splinting. The left wrist, she has had approximately five months of increasing pain. She has had no injury that she notes. This has been limiting for her. She had injections again in July which was helpful. She has used Voltaren gel with some relief. She has had multiple fusions of PIP joints including the index, long, and ring fingers of the left hand and the PIP joint of the right long finger. These have been very successful for her. She describes difficulties with grip.  She wishes to have a fusion of the right ring finger pip joint and an injection of the right index finger pip joint.   Past Medical History  Diagnosis Date  . Anemia   . Asthma -COPD   . Chronic fatigue syndrome   . GERD (gastroesophageal reflux disease)   . Hyperlipidemia   . Hypertension   . Colon polyp   . Allergic rhinitis   . Vitamin D deficiency   . Pulmonary embolism ([place])     1980s  . Arthritis   . Chronic inflammatory demyelinating polyneuropathy ([place]) 03/2011  . Blood transfusion 1957  . Osteoporosis   . Hypothyroid   . Constipation   . Seizures ([place])     1990 last seizures on meds Phenobarb  . Skin cancer     squamous cell  . Neurogenic bladder 2013    husband caths pt, manages with timed void  . chronic sinusitis 08/08/2012  . Shingles late 36's  . Cystocele 09/16/2012  . Tubular adenoma   . Breast mass     right breast in  milk duct, bx neg  . Thyroid nodule     Right thyroid lobe, only seen on Sagittal imaging measures 2.3 cm in craniocaudal dimension and appears stable  . Renal cyst     Non-complex, contrast MRI 2013 with L>R non-complex cysts, dominant 3.7 left lower pole  . COPD (chronic obstructive pulmonary disease) [facility])     Past Surgical History  Procedure Laterality Date  . Tonsillectomy and adenoidectomy    . Nasal septum surgery    . Dilation and curettage of uterus    . Tubal ligation    . Cesarean section    . Sinus surgeries      x 4  . Left ovary and tube removed    . Vocal polyps removed    . Appendectomy    . Cystocele repair    . Carpal tunnel release      right  . Shoulder arthroscopy      x2 left, 1 right  . Left finger fusion      3 fingers on left hand/one finger right  . Right median nerve decompression    . Duptyren's contracture right hand    . Finger ganglion cyst excision      right  . Abdominal hysterectomy    . Joint replacement  right and left basal joints of thumbs  . Bladder suspension    . Cataract extraction      bilateral  . Cervical neck ablation      x 7, C3-C6/3 screws and plate  . Squamous lesions removed      neck and face  . Basal cell carcinoma excision      face  . Panniculectomy    . Nasal polyp surgery      4 sinus surgeries  . Anterior fusion clivus-c2 extraoral w/ odontoid excision  8/14  . Proximal interphalangeal fusion (pip) Left 09/09/2013    Procedure: FUSION LEFT INDEX PROXIMAL INTERPHALANGEAL JOINT (PIP);  Surgeon: Cammie Sickle., MD;  Location: [facility];  Service: Orthopedics;  Laterality: Left;  . Breast ductal system excision Right 01/15/2014    Procedure: EXCISION DUCTAL SYSTEM RIGHT BREAST;  Surgeon: Stark Klein, MD;  Location: [place];  Service: General;  Laterality: Right;    Family History  Problem Relation Age of Onset  . Heart disease Mother   . Hyperlipidemia Mother     Jerilynn Mages,  aunt  . Ovarian cancer Sister   . Lung cancer Sister     lung - stage 1  . Thyroid cancer Sister   . Colon cancer Father     11  . Stomach cancer Other     first cousin  . Skin cancer Daughter   . Malignant hyperthermia Cousin   . Breast cancer Neg Hx    Social History:  reports that she quit smoking about 36 years ago. Her smoking use included Cigarettes. She has a 1.5 pack-year smoking history. She has never used smokeless tobacco. She reports that she does not drink alcohol or use illicit drugs.  Allergies:  Allergies  Allergen Reactions  . Iodine Other (See Comments)    Cardiac arrest  . Morphine And Related Other (See Comments)    Cardiac arrest    Medications Prior to Admission  Medication Sig Dispense Refill  . albuterol (PROAIR HFA) 108 (90 BASE) MCG/ACT inhaler Inhale 1 puff into the lungs as needed. 18 g 4  . amLODipine (NORVASC) 10 MG tablet Take 1 tablet (10 mg total) by mouth daily. 30 tablet 3  . arformoterol (BROVANA) 15 MCG/2ML NEBU Take 2 mLs (15 mcg total) by nebulization 2 (two) times daily. 120 mL 11  . Ascorbic Acid (VITAMIN C PO) Take 2 capsules by mouth daily.    Marland Kitchen atorvastatin (LIPITOR) 40 MG tablet Take 1 tablet (40 mg total) by mouth daily at 12 noon. 90 tablet 2  . budesonide (PULMICORT) 0.25 MG/2ML nebulizer solution Take 2 mLs (0.25 mg total) by nebulization 2 (two) times daily. 60 mL 11  . CALCIUM-VITAMIN D PO Take 1 tablet by mouth daily.     . Cholecalciferol (VITAMIN D) 2000 UNITS CAPS Take 1 capsule by mouth daily.    Marland Kitchen dexlansoprazole (DEXILANT) 60 MG capsule Take 60 mg by mouth daily.    Marland Kitchen dextroamphetamine (DEXTROSTAT) 10 MG tablet Take 1 tablet (10 mg total) by mouth 4 (four) times daily. 120 tablet 0  . diclofenac sodium (VOLTAREN) 1 % GEL Apply 1 application topically 4 (four) times daily as needed. Applies to fingers and hands for gout.    Marland Kitchen levothyroxine (SYNTHROID, LEVOTHROID) 25 MCG tablet Take 1.5 tablets (37.5 mcg total) by mouth  daily before breakfast. 45 tablet 5  . mometasone (NASONEX) 50 MCG/ACT nasal spray PLACE 2 SPRAYS INTO THE NOSE DAILY 17  g 6  . montelukast (SINGULAIR) 10 MG tablet Take 1 tablet (10 mg total) by mouth at bedtime. 90 tablet 4  . PHENobarbital (LUMINAL) 32.4 MG tablet Take 1 tablet by mouth daily at 6 AM, NOON, and 2 tablets by mouth at 8 PM. 120 tablet 5  . polyethylene glycol (MIRALAX / GLYCOLAX) packet Take 17 g by mouth at bedtime. 14 each   . predniSONE (DELTASONE) 20 MG tablet Take 1 tablet (20 mg total) by mouth daily. 30 tablet 11  . TEGRETOL-XR 100 MG 12 hr tablet Take 500 mg by mouth daily.   1  . denosumab (PROLIA) 60 MG/ML SOLN injection Inject 60 mg into the skin once. Administer in upper arm, thigh, or abdomen 180 mL 0  . ondansetron (ZOFRAN-ODT) 4 MG disintegrating tablet Take 1 tablet (4 mg total) by mouth daily. 90 tablet 0    No results found for this or any previous visit (from the past 48 hour(s)).  No results found.   A comprehensive review of systems was negative except for: Musculoskeletal: positive for arthralgias Neurological: positive for seizures  Blood pressure 162/75, pulse 93, temperature 98.5 F (36.9 C), temperature source Oral, resp. rate 15, weight 59.875 kg (132 lb), SpO2 100 %.  General appearance: alert, cooperative and appears stated age Head: Normocephalic, without obvious abnormality, atraumatic Neck: supple, symmetrical, trachea midline Resp: clear to auscultation bilaterally Cardio: regular rate and rhythm GI: non tender Extremities: intact sensation and capillary refill all digits.  +epl/fpl/io.  no wounds. Pulses: 2+ and symmetric Skin: Skin color, texture, turgor normal. No rashes or lesions Neurologic: Grossly normal Incision/Wound: none  Assessment/Plan Right ring finger pip joint and index finger pip joint arthritis.  She wishes to have an injection of the index finger pip joint and a fusion of the ring finger pip joint.  Non  operative and operative treatment options were discussed with the patient and patient wishes to proceed with operative treatment. Risks, benefits, and alternatives of surgery were discussed and the patient agrees with the plan of care.   Kierston Plasencia R 05/06/2015, 1:02 PM

## 2015-05-06 NOTE — Discharge Instructions (Addendum)

## 2015-05-07 ENCOUNTER — Encounter (HOSPITAL_BASED_OUTPATIENT_CLINIC_OR_DEPARTMENT_OTHER): Payer: Self-pay | Admitting: Orthopedic Surgery

## 2015-05-07 NOTE — Op Note (Signed)
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  PROCEDURE:   1. Right ring finger proximal interphalangeal joint Arthrodesis. 2. Right index finger injection of proximal interphalangeal joint with half and half solution of 1% plain lidocaine and Celestone 1 mL total  SURGEON:  Leanora Cover, MD  ASSISTANT:  Daryll Brod, MD.  ANESTHESIA:  Regional with sedation.  IV FLUIDS:  Per anesthesia flow sheet.  ESTIMATED BLOOD LOSS:  Minimal.  COMPLICATIONS:  None.  SPECIMENS:  None.  TOURNIQUET TIME:  49 minutes.  DISPOSITION:  Stable to PACU.  INDICATIONS:  Ms. Urben is an 81 year old female who has had multiple PIP joints fused in the past.  She has had continued pain of the right ring finger PIP joint and would like to have this fused.  She has also had pain in the index finger PIP joint and would like an injection. Risks, benefits and alternatives of the surgery were discussed including the risk of blood loss; infection; damage to nerves, vessels, tendons, ligaments, bone; failure of surgery; need for additional surgery; complications with wound healing; continued pain; nonunion; malunion; stiffness.  She voiced understanding of these risks and elected to proceed.  OPERATIVE COURSE:  After being identified preoperatively by myself, the patient and I agreed upon the procedure and site of procedure.  Surgical site was marked.  The risks, benefits, and alternatives of the surgery were  reviewed and she wished to proceed.  Surgical consent had been signed.  She was given IV Ancef as preoperative antibiotic prophylaxis. She was transferred to the operating room and placed on the operating room table in supine position with the right upper extremity on an armboard.  Regional block had been performed by Anesthesia in preoperative holding.  Sedation was induced in the operating room. Right upper extremity was prepped and draped in normal sterile orthopedic fashion.  A surgical pause was performed between the surgeons, anesthesia and operating room staff, and all were in agreement as to the patient, procedure, and site of procedure.  Tourniquet at the proximal aspect of the extremity was inflated to 250 mmHg after exsanguination of the limb with Esmarch bandage.  The injection of the index finger PIP joint was performed first.  Approximately 1 mL of half and half solution of 1% plain lidocaine and Celestone was injected. Attention was turned to the ring finger.  An incision was made at the dorsum of the PIP joint and carried into subcutaneous tissues by spreading technique.  Bipolar electrocautery was used to obtain hemostasis.  The extensor tendon was incised and elevated.  The collateral ligaments were released.  The rongeurs were used to prepare the bone surfaces, which were  eburnated.  The remainder of articular cartilage and eburnated bone was taken down to subchondral bone.  A good apposition of subchondral bone was obtained between the proximal and middle phalanges.  A guidepin was placed from the proximal phalanx into the middle phalanx and C-arm was used to confirm its position.  A screw from the [employer] set was selected and the guidepin measured.  The screw was placed over the guidepin with good purchase.  C-arm was used in AP and lateral projections to ensure appropriate reduction and position of hardware, which was the case.  The wound was copiously irrigated  with sterile saline.  The extensor tendon was repaired with a 4-0 Mersilene in a running fashion.  The skin was closed with 4-0 nylon in a horizontal mattress fashion.  The wound was dressed with sterile Xeroform, 4x4s, and wrapped with Kerlix bandage.  A volar splint was placed including the long, ring, and small fingers with the MPs flexed and the IPs extended.  This was wrapped with Kerlix and Ace bandage. Tourniquet was deflated at 49 minutes.  Fingertips were pink with brisk capillary refill after deflation of the tourniquet.  Operative drapes were broken down and the patient was awoken from anesthesia safely.  She was transferred back to stretcher and taken to PACU in stable condition. I will see her back in the office in 1 week for postoperative followup. She requested no pain medications, then we will write a prescription for Norco 5/325, 1-2 p.o. q.6 hours p.r.n. pain, dispensed #30.  She does not have to get this filled if she is comfortable.     Leanora Cover, MD     KK/MEDQ  D:  05/06/2015  T:  05/07/2015  Job:  [id number]

## 2015-05-14 ENCOUNTER — Other Ambulatory Visit: Payer: Self-pay | Admitting: Internal Medicine

## 2015-05-14 ENCOUNTER — Telehealth: Payer: Self-pay | Admitting: Internal Medicine

## 2015-05-14 DIAGNOSIS — M79642 Pain in left hand: Secondary | ICD-10-CM | POA: Diagnosis not present

## 2015-05-14 DIAGNOSIS — M1990 Unspecified osteoarthritis, unspecified site: Secondary | ICD-10-CM | POA: Diagnosis not present

## 2015-05-14 DIAGNOSIS — M1812 Unilateral primary osteoarthritis of first carpometacarpal joint, left hand: Secondary | ICD-10-CM | POA: Diagnosis not present

## 2015-05-14 NOTE — Telephone Encounter (Signed)
I have attempted to call patient back with busy signal x 2. I will attempt to call back at a later time. Per Dr Vena Rua last office note "She is having no heartburn symptoms. We will try discontinuing Dexilant. I'll have her do 60 mg every other day for 1 week and then stop. She is asked to call me if she has return of heartburn or any dysphagia, indigestion, dyspepsia, nausea or vomiting. She voices understanding." Is patient having return heartburn symptoms?

## 2015-05-17 ENCOUNTER — Encounter: Payer: Self-pay | Admitting: Internal Medicine

## 2015-05-17 NOTE — Telephone Encounter (Signed)
I would recommend ranitidine 150 mg BID PRN for occasional indigestion If this is not helpful, let me know. With her history of constipation and issues with BM would avoid pepto if possible

## 2015-05-17 NOTE — Telephone Encounter (Signed)
Dr Hilarie Fredrickson-  Very nice patient states that she has "indigestion" at times that seems to be relieved by occasional pepto bismol. I have advised that she likely does not need a PPI (Dexilant) for occasional indigestion but she would just like to be 100% sure that this is what you wanted. She also would like you to know that "I absolutely refuse to take Reglan because I will be the lucky one to get all those side effects and I dont want that." Dr Hilarie Fredrickson, please advise.Marland KitchenMarland KitchenMarland Kitchen

## 2015-05-17 NOTE — Telephone Encounter (Signed)
Patient advised of Dr Vena Rua recommendations. Patient verbalizes understanding.

## 2015-05-17 NOTE — Telephone Encounter (Signed)
I have left a voicemail for patient to call back. 

## 2015-05-20 ENCOUNTER — Other Ambulatory Visit: Payer: Self-pay | Admitting: Internal Medicine

## 2015-05-20 DIAGNOSIS — G6181 Chronic inflammatory demyelinating polyneuritis: Secondary | ICD-10-CM | POA: Diagnosis not present

## 2015-05-20 DIAGNOSIS — J449 Chronic obstructive pulmonary disease, unspecified: Secondary | ICD-10-CM | POA: Diagnosis not present

## 2015-05-20 DIAGNOSIS — J45909 Unspecified asthma, uncomplicated: Secondary | ICD-10-CM | POA: Diagnosis not present

## 2015-05-21 DIAGNOSIS — M1812 Unilateral primary osteoarthritis of first carpometacarpal joint, left hand: Secondary | ICD-10-CM | POA: Diagnosis not present

## 2015-05-23 ENCOUNTER — Encounter: Payer: Self-pay | Admitting: Internal Medicine

## 2015-05-26 ENCOUNTER — Other Ambulatory Visit: Payer: Self-pay

## 2015-06-14 ENCOUNTER — Ambulatory Visit (INDEPENDENT_AMBULATORY_CARE_PROVIDER_SITE_OTHER): Payer: Medicare Other | Admitting: Internal Medicine

## 2015-06-14 ENCOUNTER — Encounter: Payer: Self-pay | Admitting: Internal Medicine

## 2015-06-14 ENCOUNTER — Telehealth: Payer: Self-pay

## 2015-06-14 ENCOUNTER — Ambulatory Visit (HOSPITAL_BASED_OUTPATIENT_CLINIC_OR_DEPARTMENT_OTHER)
Admission: RE | Admit: 2015-06-14 | Discharge: 2015-06-14 | Disposition: A | Discharge status: Home / Self Care | Payer: Medicare Other | Source: Ambulatory Visit | Attending: Internal Medicine | Admitting: Internal Medicine

## 2015-06-14 VITALS — BP 124/68 | HR 93 | Temp 98.3°F | Ht 62.0 in | Wt 131.4 lb

## 2015-06-14 DIAGNOSIS — I10 Essential (primary) hypertension: Secondary | ICD-10-CM

## 2015-06-14 DIAGNOSIS — M5134 Other intervertebral disc degeneration, thoracic region: Secondary | ICD-10-CM | POA: Diagnosis not present

## 2015-06-14 DIAGNOSIS — R569 Unspecified convulsions: Secondary | ICD-10-CM | POA: Diagnosis not present

## 2015-06-14 DIAGNOSIS — M47814 Spondylosis without myelopathy or radiculopathy, thoracic region: Secondary | ICD-10-CM | POA: Diagnosis not present

## 2015-06-14 DIAGNOSIS — M546 Pain in thoracic spine: Secondary | ICD-10-CM | POA: Insufficient documentation

## 2015-06-14 DIAGNOSIS — L680 Hirsutism: Secondary | ICD-10-CM

## 2015-06-14 LAB — TESTOSTERONE: Testosterone: 34.35 ng/dL (ref 15.00–40.00)

## 2015-06-14 NOTE — Telephone Encounter (Signed)
Pt due for Prolia on or after 08/18/2015. Insurance verification started online at [employer]. Insurance verification request form sent for scanning. Awaiting insurance determination.

## 2015-06-14 NOTE — Patient Instructions (Addendum)
BEFORE YOU LEAVE THE OFFICE:  GO TO THE LAB  Get the blood work    GO TO THE FRONT DESK Schedule a complete physical exam to be done in 2-3 months Please be fasting    AFTER YOU LEAVE THE OFFICE:  Stop by the first floor and get the XR

## 2015-06-14 NOTE — Progress Notes (Signed)
Pre visit review using our clinic review tool, if applicable. No additional management support is needed unless otherwise documented below in the visit note. 

## 2015-06-14 NOTE — Progress Notes (Addendum)
Subjective:    Patient ID: [name], female    DOB: 28-Dec-1932, 82 y.o.   MRN: [id number]  DOS:  06/14/2015 Type of visit - description : Routine visit, here with her has been Interval history: Her main concern today is thoracic back pain, started suddenly on December 2,2016 witht pain at the right side of the thoracic spine, some radiation distally, went to see her pulmonologist, a chest x-ray was done and it was ok she was told . Since then, the pain has center at the mid thoracic spine, no radiation. HTN: Good compliance with medications, ambulatory BPs within normal when checked Also concerned about hair growing on her arm. Was recommended to check a DHEA per pulmonary  Review of Systems  Denies fever, chills. No unusual upper or lower extremity paresthesias. Coordination offer lower extremities are at baseline. She has neurogenic bladder, no unusual symptoms.  Past Medical History  Diagnosis Date  . Anemia   . Asthma -COPD   . Chronic fatigue syndrome   . GERD (gastroesophageal reflux disease)   . Hyperlipidemia   . Hypertension   . Colon polyp   . Allergic rhinitis   . Vitamin D deficiency   . Pulmonary embolism ([place])     1980s  . Arthritis   . Chronic inflammatory demyelinating polyneuropathy ([place]) 03/2011  . Blood transfusion 1957  . Osteoporosis   . Hypothyroid   . Constipation   . Seizures ([place])     1990 last seizures on meds Phenobarb  . Skin cancer     squamous cell  . Neurogenic bladder 2013    husband caths pt, manages with timed void  . chronic sinusitis 08/08/2012  . Shingles late 77's  . Cystocele 09/16/2012  . Tubular adenoma   . Breast mass     right breast in milk duct, bx neg  . Thyroid nodule     Right thyroid lobe, only seen on Sagittal imaging measures 2.3 cm in craniocaudal dimension and appears stable  . Renal cyst     Non-complex, contrast MRI 2013 with L>R non-complex cysts, dominant 3.7 left lower pole  . COPD (chronic  obstructive pulmonary disease) [facility])     Past Surgical History  Procedure Laterality Date  . Tonsillectomy and adenoidectomy    . Nasal septum surgery    . Dilation and curettage of uterus    . Tubal ligation    . Cesarean section    . Sinus surgeries      x 4  . Left ovary and tube removed    . Vocal polyps removed    . Appendectomy    . Cystocele repair    . Carpal tunnel release      right  . Shoulder arthroscopy      x2 left, 1 right  . Left finger fusion      3 fingers on left hand/one finger right  . Right median nerve decompression    . Duptyren's contracture right hand    . Finger ganglion cyst excision      right  . Abdominal hysterectomy    . Joint replacement      right and left basal joints of thumbs  . Bladder suspension    . Cataract extraction      bilateral  . Cervical neck ablation      x 7, C3-C6/3 screws and plate  . Squamous lesions removed      neck and face  . Basal cell carcinoma excision  face  . Panniculectomy    . Nasal polyp surgery      4 sinus surgeries  . Anterior fusion clivus-c2 extraoral w/ odontoid excision  8/14  . Proximal interphalangeal fusion (pip) Left 09/09/2013    Procedure: FUSION LEFT INDEX PROXIMAL INTERPHALANGEAL JOINT (PIP);  Surgeon: Cammie Sickle., MD;  Location: [facility];  Service: Orthopedics;  Laterality: Left;  . Breast ductal system excision Right 01/15/2014    Procedure: EXCISION DUCTAL SYSTEM RIGHT BREAST;  Surgeon: Stark Klein, MD;  Location: [place];  Service: General;  Laterality: Right;  . Finger arthrodesis Right 05/06/2015    Procedure: RIGHT RING PROXIMAL INTERPHALANGEAL FUSION (PIP);  Surgeon: Leanora Cover, MD;  Location: [facility];  Service: Orthopedics;  Laterality: Right;  . Steriod injection Right 05/06/2015    Procedure: STEROID INJECTION;  Surgeon: Leanora Cover, MD;  Location: [place];  Service: Orthopedics;  Laterality: Right;   Right Index Finger Proximal InterPhalangeal Joint Injection    Social History   Social History  . Marital Status: Married    Spouse Name: N/A  . Number of Children: 3  . Years of Education: N/A   Occupational History  . retired, Licensed conveyancer    Social History Main Topics  . Smoking status: Former Smoker -- 0.50 packs/day for 3 years    Types: Cigarettes    Quit date: 05/29/1978  . Smokeless tobacco: Never Used     Comment: quit smoking 35 years ago  . Alcohol Use: No  . Drug Use: No  . Sexual Activity: Yes   Other Topics Concern  . Not on file   Social History Narrative   Pt lives at home with her spouse.   Moved to [place] ~ 2004           Medication List       This list is accurate as of: 06/14/15  6:15 PM.  Always use your most recent med list.               albuterol 108 (90 Base) MCG/ACT inhaler  Commonly known as:  PROAIR HFA  Inhale 1 puff into the lungs as needed.     amLODipine 10 MG tablet  Commonly known as:  NORVASC  TAKE ONE (1) TABLET EACH DAY     arformoterol 15 MCG/2ML Nebu  Commonly known as:  BROVANA  Take 2 mLs (15 mcg total) by nebulization 2 (two) times daily.     atorvastatin 40 MG tablet  Commonly known as:  LIPITOR  Take 1 tablet (40 mg total) by mouth daily at 12 noon.     budesonide 0.25 MG/2ML nebulizer solution  Commonly known as:  PULMICORT  Take 2 mLs (0.25 mg total) by nebulization 2 (two) times daily.     CALCIUM-VITAMIN D PO  Take 1 tablet by mouth daily.     denosumab 60 MG/ML Soln injection  Commonly known as:  PROLIA  Inject 60 mg into the skin once. Administer in upper arm, thigh, or abdomen     DEXILANT 60 MG capsule  Generic drug:  dexlansoprazole  Take 60 mg by mouth daily. Reported on 06/14/2015     dextroamphetamine 10 MG tablet  Commonly known as:  DEXTROSTAT  Take 1 tablet (10 mg total) by mouth 4 (four) times daily.     levothyroxine 25 MCG tablet  Commonly known as:  SYNTHROID, LEVOTHROID  Take  1.5 tablets (37.5 mcg total) by mouth  daily before breakfast.     mometasone 50 MCG/ACT nasal spray  Commonly known as:  NASONEX  PLACE 2 SPRAYS INTO THE NOSE DAILY     montelukast 10 MG tablet  Commonly known as:  SINGULAIR  Take 1 tablet (10 mg total) by mouth at bedtime.     ondansetron 4 MG disintegrating tablet  Commonly known as:  ZOFRAN-ODT  Take 1 tablet (4 mg total) by mouth daily.     PHENobarbital 32.4 MG tablet  Commonly known as:  LUMINAL  Take 1 tablet by mouth daily at 6 AM, NOON, and 2 tablets by mouth at 8 PM.     polyethylene glycol packet  Commonly known as:  MIRALAX / GLYCOLAX  Take 17 g by mouth at bedtime.     predniSONE 20 MG tablet  Commonly known as:  DELTASONE  Take 1 tablet (20 mg total) by mouth daily.     ranitidine 150 MG tablet  Commonly known as:  ZANTAC  Take 150 mg by mouth 2 (two) times daily.     TEGRETOL-XR 100 MG 12 hr tablet  Generic drug:  carbamazepine  Take 500 mg by mouth daily.     VITAMIN C PO  Take 2 capsules by mouth daily.     Vitamin D 2000 units Caps  Take 1 capsule by mouth daily.     VOLTAREN 1 % Gel  Generic drug:  diclofenac sodium  Apply 1 application topically 4 (four) times daily as needed. Applies to fingers and hands for gout.           Objective:   Physical Exam BP 124/68 mmHg  Pulse 93  Temp(Src) 98.3 F (36.8 C) (Oral)  Ht 5\' 2"  (1.575 m)  Wt 131 lb 6 oz (59.591 kg)  BMI 24.02 kg/m2  SpO2 98% General:   Well developed, well nourished . NAD.  HEENT:  Normocephalic . Face symmetric, atraumatic Lungs:  CTA B Normal respiratory effort, no intercostal retractions, no accessory muscle use. Heart: RRR,  no murmur.  No pretibial edema bilaterally MSK: No TTP at the thoracic spine.  Skin: Not pale. Not jaundice., 1 cm long, dark hairs and the forearm Neurologic:  alert & oriented X3.  Speech normal, gait  assisted by a walker. Psych--  Cognition and judgment appear intact.  Cooperative with  normal attention span and concentration.  Behavior appropriate. No anxious or depressed appearing.      Assessment & Plan:   Assessment HTN Hyperlipidemia Hypothyroidism thyroid nodule -- per MRI @ [facility], [place] confirmatory, Bx 10-2014 BENIGN THYROID NODULE (BETHESDA CATEGORY II). Pulmonary: Asthma, COPD on chronic prednisone  GERD Osteoporosis --on prolia DJD Chronic fatigue syndrome Hyypersomnia Neuro: --ATAXIA/ CIPD--Chronic inflammatory demyelinating polyneuropathy dx ~2014, on tegretol --Uses a walker since approximately 02-2015 --H/o Seizures  (on phenobarbital, rx by pcp, had last sz in the 90s when she was trying to d/c phenobarbital) --Neurogenic bladder- self catheterize prn,h/o urinary retention, prolapse, sees neurology Pain mngmt:  Used to take pain meds for neck pain, cervical myelopathy , better after surgery; intolerant to morphine, able to tolerate oxycodone Vitamin D deficiency H/o Skin cancer H/o pulmonary emboli 1980s  Plan: HTN: Well-controlled, last BMP satisfactory Hypothyroidism: Well-controlled. Hirsutism: Testosterone and DHEA level. Thoracic back pain: High risk for Fx, check a spine x-ray, if pain persist will recommend to see ortho Seizure disorder, on phenobarbital, check levels.  RTC 3 months CPX  Multiple questions answered to the best of my ability, chart reviewed.

## 2015-06-18 ENCOUNTER — Emergency Department (HOSPITAL_COMMUNITY): Payer: No Typology Code available for payment source

## 2015-06-18 ENCOUNTER — Emergency Department (HOSPITAL_COMMUNITY)
Admission: EM | Admit: 2015-06-18 | Discharge: 2015-06-18 | Disposition: A | Discharge status: Home / Self Care | Payer: No Typology Code available for payment source | Attending: Emergency Medicine | Admitting: Emergency Medicine

## 2015-06-18 ENCOUNTER — Encounter (HOSPITAL_COMMUNITY): Payer: Self-pay | Admitting: Emergency Medicine

## 2015-06-18 DIAGNOSIS — E039 Hypothyroidism, unspecified: Secondary | ICD-10-CM | POA: Diagnosis not present

## 2015-06-18 DIAGNOSIS — G8929 Other chronic pain: Secondary | ICD-10-CM | POA: Insufficient documentation

## 2015-06-18 DIAGNOSIS — Z85828 Personal history of other malignant neoplasm of skin: Secondary | ICD-10-CM | POA: Diagnosis not present

## 2015-06-18 DIAGNOSIS — Z86711 Personal history of pulmonary embolism: Secondary | ICD-10-CM | POA: Diagnosis not present

## 2015-06-18 DIAGNOSIS — Z7952 Long term (current) use of systemic steroids: Secondary | ICD-10-CM | POA: Diagnosis not present

## 2015-06-18 DIAGNOSIS — E559 Vitamin D deficiency, unspecified: Secondary | ICD-10-CM | POA: Diagnosis not present

## 2015-06-18 DIAGNOSIS — K219 Gastro-esophageal reflux disease without esophagitis: Secondary | ICD-10-CM | POA: Insufficient documentation

## 2015-06-18 DIAGNOSIS — Z87891 Personal history of nicotine dependence: Secondary | ICD-10-CM | POA: Insufficient documentation

## 2015-06-18 DIAGNOSIS — M1990 Unspecified osteoarthritis, unspecified site: Secondary | ICD-10-CM | POA: Diagnosis not present

## 2015-06-18 DIAGNOSIS — J449 Chronic obstructive pulmonary disease, unspecified: Secondary | ICD-10-CM | POA: Insufficient documentation

## 2015-06-18 DIAGNOSIS — Z862 Personal history of diseases of the blood and blood-forming organs and certain disorders involving the immune mechanism: Secondary | ICD-10-CM | POA: Insufficient documentation

## 2015-06-18 DIAGNOSIS — Y9389 Activity, other specified: Secondary | ICD-10-CM | POA: Diagnosis not present

## 2015-06-18 DIAGNOSIS — Z7951 Long term (current) use of inhaled steroids: Secondary | ICD-10-CM | POA: Diagnosis not present

## 2015-06-18 DIAGNOSIS — Q6100 Congenital renal cyst, unspecified: Secondary | ICD-10-CM | POA: Diagnosis not present

## 2015-06-18 DIAGNOSIS — Z8669 Personal history of other diseases of the nervous system and sense organs: Secondary | ICD-10-CM | POA: Insufficient documentation

## 2015-06-18 DIAGNOSIS — M810 Age-related osteoporosis without current pathological fracture: Secondary | ICD-10-CM | POA: Diagnosis not present

## 2015-06-18 DIAGNOSIS — T148 Other injury of unspecified body region: Secondary | ICD-10-CM | POA: Diagnosis not present

## 2015-06-18 DIAGNOSIS — Z79899 Other long term (current) drug therapy: Secondary | ICD-10-CM | POA: Diagnosis not present

## 2015-06-18 DIAGNOSIS — E785 Hyperlipidemia, unspecified: Secondary | ICD-10-CM | POA: Diagnosis not present

## 2015-06-18 DIAGNOSIS — I10 Essential (primary) hypertension: Secondary | ICD-10-CM | POA: Insufficient documentation

## 2015-06-18 DIAGNOSIS — Y92410 Unspecified street and highway as the place of occurrence of the external cause: Secondary | ICD-10-CM | POA: Diagnosis not present

## 2015-06-18 DIAGNOSIS — S3992XA Unspecified injury of lower back, initial encounter: Secondary | ICD-10-CM | POA: Diagnosis not present

## 2015-06-18 DIAGNOSIS — Z8619 Personal history of other infectious and parasitic diseases: Secondary | ICD-10-CM | POA: Insufficient documentation

## 2015-06-18 DIAGNOSIS — Z86010 Personal history of colonic polyps: Secondary | ICD-10-CM | POA: Diagnosis not present

## 2015-06-18 DIAGNOSIS — Z8742 Personal history of other diseases of the female genital tract: Secondary | ICD-10-CM | POA: Insufficient documentation

## 2015-06-18 DIAGNOSIS — Z87448 Personal history of other diseases of urinary system: Secondary | ICD-10-CM | POA: Diagnosis not present

## 2015-06-18 DIAGNOSIS — S79911A Unspecified injury of right hip, initial encounter: Secondary | ICD-10-CM | POA: Diagnosis not present

## 2015-06-18 DIAGNOSIS — Z86018 Personal history of other benign neoplasm: Secondary | ICD-10-CM | POA: Diagnosis not present

## 2015-06-18 DIAGNOSIS — Y998 Other external cause status: Secondary | ICD-10-CM | POA: Insufficient documentation

## 2015-06-18 DIAGNOSIS — M25532 Pain in left wrist: Secondary | ICD-10-CM | POA: Diagnosis not present

## 2015-06-18 DIAGNOSIS — M549 Dorsalgia, unspecified: Secondary | ICD-10-CM | POA: Diagnosis not present

## 2015-06-18 LAB — PHENOBARBITAL LEVEL: Phenobarbital: 38.3 mg/L (ref 15.0–40.0)
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
## 2015-06-18 NOTE — Discharge Instructions (Signed)
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
## 2015-06-23 ENCOUNTER — Telehealth: Payer: Self-pay | Admitting: *Deleted

## 2015-06-23 ENCOUNTER — Encounter: Payer: Self-pay | Admitting: Internal Medicine

## 2015-06-23 DIAGNOSIS — M546 Pain in thoracic spine: Secondary | ICD-10-CM

## 2015-06-23 LAB — DHEA

## 2015-06-23 NOTE — Telephone Encounter (Signed)
Pt says that she was told about scheduling a lab appt. Pt says that she also sent PCP a message in regards to pain that she's currently having via My Chart, she is awaiting the providers response before scheduling lab appt incase in the event that the provider wants to see her again she can have one visit instead of two.  Please advise   I will be more than happy to call pt back and schedule.

## 2015-06-23 NOTE — Telephone Encounter (Signed)
Okay to recheck if so desired, make pt aware that her insurance may not like to pay for the additional phenobarbital level

## 2015-06-23 NOTE — Telephone Encounter (Signed)
Please advise 

## 2015-06-23 NOTE — Telephone Encounter (Signed)
[facility] called to report that they needed more blood for the DHEA lab test . Pt notified / will schedule lab appointment at her convenience. Pt states that she also wants to have her phenobarbital level rechecked since its on the high side of normal .. Please advise.

## 2015-06-24 ENCOUNTER — Encounter: Payer: Self-pay | Admitting: Internal Medicine

## 2015-06-24 ENCOUNTER — Other Ambulatory Visit (INDEPENDENT_AMBULATORY_CARE_PROVIDER_SITE_OTHER): Payer: Medicare Other

## 2015-06-24 DIAGNOSIS — M546 Pain in thoracic spine: Secondary | ICD-10-CM | POA: Diagnosis not present

## 2015-06-24 DIAGNOSIS — R569 Unspecified convulsions: Secondary | ICD-10-CM | POA: Diagnosis not present

## 2015-06-24 NOTE — Telephone Encounter (Signed)
Referral placed.

## 2015-06-24 NOTE — Telephone Encounter (Signed)
Patient continue with thoracic pain, states that at [facility] he sees neurology and neurosurgeon. We agreed to send her to local orthopedic doctor. She will come for blood work, see above [facility] please enter a orthopedic referral ---DX thoracic pain

## 2015-06-24 NOTE — Telephone Encounter (Signed)
Message has been forwarded to Dr. Larose Kells on 06/23/2015, awaiting response from him.

## 2015-06-25 LAB — PHENOBARBITAL LEVEL: Phenobarbital: 38.2 mg/L (ref 15.0–40.0)

## 2015-06-25 NOTE — Progress Notes (Unsigned)
Pt is going to f/u neurology 07/08/15 at [facility]. She said she is going to manage her pain with patches and heating pad until then. She said that [facility] needed full copies of her chart from [place] before being seen. [facility] turn around would be up to 6 months for the info to be sent. She said she is not going to get the records from [place]. She'll let them treat her at [facility] and worry about it from then.  If there is someone else that she can see without having all of the records from [place] she will go, but thinks [place] wants to much and it is a completely different problem, unrelated to the current issue.

## 2015-06-26 ENCOUNTER — Encounter: Payer: Self-pay | Admitting: Internal Medicine

## 2015-06-30 MED ORDER — DEXTROAMPHETAMINE SULFATE 10 MG PO TABS: 10.0000 mg | ORAL_TABLET | Freq: Four times a day (QID) | ORAL | Status: DC

## 2015-06-30 NOTE — Telephone Encounter (Signed)
Pt is requesting refill on Dextrostat.  Last OV: 06/14/2015 Last Fill: 04/26/2015 #120 and 0RF  For December 2016, Pt sig: 1 tablet qid UDS: 01/01/2015 Low risk  Please advise.

## 2015-06-30 NOTE — Telephone Encounter (Signed)
Print 2 prescription for dextroamphetamine  Received: Today    Colon Branch, MD  Damita Dunnings, CMA

## 2015-06-30 NOTE — Telephone Encounter (Signed)
Rx's placed at front desk for pick up at Pt's convenience. Pt informed via MyChart.

## 2015-06-30 NOTE — Telephone Encounter (Signed)
Rx's for February and March 2017 printed, awaiting MD signature.

## 2015-07-02 ENCOUNTER — Encounter: Payer: Self-pay | Admitting: Internal Medicine

## 2015-07-03 LAB — DHEA

## 2015-07-05 NOTE — Telephone Encounter (Signed)
Chart updated, cscope no longer indicated. Letter completed and released to MyChart.

## 2015-07-05 NOTE — Telephone Encounter (Signed)
Letter and colonoscopy, let her know when letter ready  Received: Today    Colon Branch, MD  [name], CMA            1. Please modify his chart, does not need anymore colonoscopies or colon cancer screening  2. Print a letter   To whom it may concern  [name] is a patient of mine, she has a number of medical issues that impair her mobility, in fact she is now dependent on a walker for all her activities .  She also has a number of stomach problems and needs to use the restroom frequently and sometimes unexpectedly.  In my medical opinion it would be extremely difficult for this lady to fulfill her [place], I'm respectfully recommending her to be excused

## 2015-07-06 ENCOUNTER — Telehealth: Payer: Self-pay | Admitting: *Deleted

## 2015-07-06 NOTE — Telephone Encounter (Signed)
Patient dropped off Juror Duty Information sheet with [employer] envelope, requests Letter to be created explaining why and/or excusing her from jury duty; forwarded to provider/SLS 02/07

## 2015-07-06 NOTE — Telephone Encounter (Signed)
Letter printed and placed in [employer] envelope back to patient/SLS 02/07

## 2015-07-08 DIAGNOSIS — R252 Cramp and spasm: Secondary | ICD-10-CM | POA: Diagnosis not present

## 2015-07-08 DIAGNOSIS — M4802 Spinal stenosis, cervical region: Secondary | ICD-10-CM | POA: Diagnosis not present

## 2015-07-14 ENCOUNTER — Encounter: Payer: Self-pay | Admitting: Internal Medicine

## 2015-07-15 ENCOUNTER — Encounter: Payer: Self-pay | Admitting: Internal Medicine

## 2015-07-16 ENCOUNTER — Other Ambulatory Visit: Payer: Self-pay | Admitting: Internal Medicine

## 2015-07-16 DIAGNOSIS — R799 Abnormal finding of blood chemistry, unspecified: Secondary | ICD-10-CM

## 2015-07-21 DIAGNOSIS — L821 Other seborrheic keratosis: Secondary | ICD-10-CM | POA: Diagnosis not present

## 2015-07-28 NOTE — Telephone Encounter (Signed)
Message sent to Tricia for Prolia to be ordered.  

## 2015-07-28 NOTE — Telephone Encounter (Signed)
Received eligibility list for Pt. Estimated out-of-pocket cost for Pt is 0%. Eligibility list sent for scanning.

## 2015-07-30 DIAGNOSIS — M1990 Unspecified osteoarthritis, unspecified site: Secondary | ICD-10-CM | POA: Diagnosis not present

## 2015-07-31 ENCOUNTER — Other Ambulatory Visit: Payer: Self-pay | Admitting: Internal Medicine

## 2015-08-03 NOTE — Telephone Encounter (Signed)
Prolia in fridge, MyChart message sent to Pt for her to schedule appt.

## 2015-08-04 NOTE — Telephone Encounter (Signed)
Pt requesting Prolia injection be completed at CPE on 09/15/2015.

## 2015-08-19 DIAGNOSIS — R0602 Shortness of breath: Secondary | ICD-10-CM | POA: Diagnosis not present

## 2015-08-19 DIAGNOSIS — J45909 Unspecified asthma, uncomplicated: Secondary | ICD-10-CM | POA: Diagnosis not present

## 2015-08-19 DIAGNOSIS — J309 Allergic rhinitis, unspecified: Secondary | ICD-10-CM | POA: Diagnosis not present

## 2015-09-04 ENCOUNTER — Encounter: Payer: Self-pay | Admitting: Internal Medicine

## 2015-09-06 ENCOUNTER — Other Ambulatory Visit: Payer: Self-pay | Admitting: Internal Medicine

## 2015-09-06 DIAGNOSIS — L308 Other specified dermatitis: Secondary | ICD-10-CM | POA: Diagnosis not present

## 2015-09-06 MED ORDER — DEXTROAMPHETAMINE SULFATE 10 MG PO TABS: 10.0000 mg | ORAL_TABLET | Freq: Four times a day (QID) | ORAL | Status: DC

## 2015-09-06 NOTE — Telephone Encounter (Signed)
Rx placed at front desk for Pt to pick up at her convenience. Pt informed via MyChart.

## 2015-09-06 NOTE — Telephone Encounter (Signed)
Rx's for April and May 2017 printed, awaiting MD signature.

## 2015-09-06 NOTE — Telephone Encounter (Signed)
Print dextroamphetamine, 2 prescriptions  Received: Today    Colon Branch, MD  Damita Dunnings, CMA

## 2015-09-14 ENCOUNTER — Encounter: Payer: Self-pay | Admitting: Behavioral Health

## 2015-09-14 ENCOUNTER — Telehealth: Payer: Self-pay | Admitting: Behavioral Health

## 2015-09-14 NOTE — Telephone Encounter (Signed)
Pre-Visit Call completed with patient and chart updated.   Pre-Visit Info documented in Specialty Comments under [employer].    

## 2015-09-15 ENCOUNTER — Encounter: Payer: Self-pay | Admitting: Internal Medicine

## 2015-09-15 ENCOUNTER — Ambulatory Visit (INDEPENDENT_AMBULATORY_CARE_PROVIDER_SITE_OTHER): Payer: Medicare Other | Admitting: Internal Medicine

## 2015-09-15 VITALS — BP 116/74 | HR 95 | Temp 98.1°F | Ht 62.0 in | Wt 134.2 lb

## 2015-09-15 DIAGNOSIS — I10 Essential (primary) hypertension: Secondary | ICD-10-CM | POA: Diagnosis not present

## 2015-09-15 DIAGNOSIS — R5383 Other fatigue: Secondary | ICD-10-CM

## 2015-09-15 DIAGNOSIS — E039 Hypothyroidism, unspecified: Secondary | ICD-10-CM

## 2015-09-15 DIAGNOSIS — Z09 Encounter for follow-up examination after completed treatment for conditions other than malignant neoplasm: Secondary | ICD-10-CM

## 2015-09-15 DIAGNOSIS — Z0001 Encounter for general adult medical examination with abnormal findings: Secondary | ICD-10-CM | POA: Diagnosis not present

## 2015-09-15 DIAGNOSIS — M810 Age-related osteoporosis without current pathological fracture: Secondary | ICD-10-CM

## 2015-09-15 DIAGNOSIS — E785 Hyperlipidemia, unspecified: Secondary | ICD-10-CM | POA: Diagnosis not present

## 2015-09-15 DIAGNOSIS — Z0000 Encounter for general adult medical examination without abnormal findings: Secondary | ICD-10-CM

## 2015-09-15 MED ORDER — DENOSUMAB 60 MG/ML ~~LOC~~ SOLN
60.0000 mg | Freq: Once | SUBCUTANEOUS | Status: AC
  Administered 2015-09-15: 60 mg via SUBCUTANEOUS

## 2015-09-15 NOTE — Patient Instructions (Signed)
Get your blood work before you leave   next visit in 6 months, please make an appointment  Fall Prevention and [place] cause injuries and can affect all age groups. It is possible to use preventive measures to significantly decrease the likelihood of falls. There are many simple measures which can make your home safer and prevent falls. OUTDOORS  Repair cracks and edges of walkways and driveways.  Remove high doorway thresholds.  Trim shrubbery on the main path into your home.  Have good outside lighting.  Clear walkways of tools, rocks, debris, and clutter.  Check that handrails are not broken and are securely fastened. Both sides of steps should have handrails.  Have leaves, snow, and ice cleared regularly.  Use sand or salt on walkways during winter months.  In the garage, clean up grease or oil spills. BATHROOM  Install night lights.  Install grab bars by the toilet and in the tub and shower.  Use non-skid mats or decals in the tub or shower.  Place a plastic non-slip stool in the shower to sit on, if needed.  Keep floors dry and clean up all water on the floor immediately.  Remove soap buildup in the tub or shower on a regular basis.  Secure bath mats with non-slip, double-sided rug tape.  Remove throw rugs and tripping hazards from the floors. BEDROOMS  Install night lights.  Make sure a bedside light is easy to reach.  Do not use oversized bedding.  Keep a telephone by your bedside.  Have a firm chair with side arms to use for getting dressed.  Remove throw rugs and tripping hazards from the floor. KITCHEN  Keep handles on pots and pans turned toward the center of the stove. Use back burners when possible.  Clean up spills quickly and allow time for drying.  Avoid walking on wet floors.  Avoid hot utensils and knives.  Position shelves so they are not too high or low.  Place commonly used objects within easy reach.  If necessary,  use a sturdy step stool with a grab bar when reaching.  Keep electrical cables out of the way.  Do not use floor polish or wax that makes floors slippery. If you must use wax, use non-skid floor wax.  Remove throw rugs and tripping hazards from the floor. STAIRWAYS  Never leave objects on stairs.  Place handrails on both sides of stairways and use them. Fix any loose handrails. Make sure handrails on both sides of the stairways are as long as the stairs.  Check carpeting to make sure it is firmly attached along stairs. Make repairs to worn or loose carpet promptly.  Avoid placing throw rugs at the top or bottom of stairways, or properly secure the rug with carpet tape to prevent slippage. Get rid of throw rugs, if possible.  Have an electrician put in a light switch at the top and bottom of the stairs. OTHER FALL PREVENTION TIPS  Wear low-heel or rubber-soled shoes that are supportive and fit well. Wear closed toe shoes.  When using a stepladder, make sure it is fully opened and both spreaders are firmly locked. Do not climb a closed stepladder.  Add color or contrast paint or tape to grab bars and handrails in your home. Place contrasting color strips on first and last steps.  Learn and use mobility aids as needed. Install an electrical emergency response system.  Turn on lights to avoid dark areas. Replace light bulbs that burn out  immediately. Get light switches that glow.  Arrange furniture to create clear pathways. Keep furniture in the same place.  Firmly attach carpet with non-skid or double-sided tape.  Eliminate uneven floor surfaces.  Select a carpet pattern that does not visually hide the edge of steps.  Be aware of all pets. OTHER HOME SAFETY TIPS  Set the water temperature for 120 F (48.8 C).  Keep emergency numbers on or near the telephone.  Keep smoke detectors on every level of the home and near sleeping areas. Document Released: 05/05/2002 Document  Revised: 11/14/2011 Document Reviewed: 08/04/2011 [facility] Patient Information 2015 [place], [place]. This information is not intended to replace advice given to you by your health care provider. Make sure you discuss any questions you have with your health care provider.   Preventive Care for Adults Ages 19 and over  Blood pressure check.** / Every 1 to 2 years.  Lipid and cholesterol check.**/ Every 5 years beginning at age 20.  Lung cancer screening. / Every year if you are aged 33-80 years and have a 30-pack-year history of smoking and currently smoke or have quit within the past 15 years. Yearly screening is stopped once you have quit smoking for at least 15 years or develop a health problem that would prevent you from having lung cancer treatment.  Fecal occult blood test (FOBT) of stool. / Every year beginning at age 79 and continuing until age 68. You may not have to do this test if you get a colonoscopy every 10 years.  Flexible sigmoidoscopy** or colonoscopy.** / Every 5 years for a flexible sigmoidoscopy or every 10 years for a colonoscopy beginning at age 41 and continuing until age 95.  Hepatitis C blood test.** / For all people born from [birth year] through 1963 and any individual with known risks for hepatitis C.  Abdominal aortic aneurysm (AAA) screening.** / A one-time screening for ages 65 to 75 years who are current or former smokers.  Skin self-exam. / Monthly.  Influenza vaccine. / Every year.  Tetanus, diphtheria, and acellular pertussis (Tdap/Td) vaccine.** / 1 dose of Td every 10 years.  Varicella vaccine.** / Consult your health care provider.  Zoster vaccine.** / 1 dose for adults aged 16 years or older.  Pneumococcal 13-valent conjugate (PCV13) vaccine.** / Consult your health care provider.  Pneumococcal polysaccharide (PPSV23) vaccine.** / 1 dose for all adults aged 15 years and older.  Meningococcal vaccine.** / Consult your health care provider.  Hepatitis  A vaccine.** / Consult your health care provider.  Hepatitis B vaccine.** / Consult your health care provider.  Haemophilus influenzae type b (Hib) vaccine.** / Consult your health care provider. **Family history and personal history of risk and conditions may change your health care provider's recommendations. Document Released: 07/11/2001 Document Revised: 05/20/2013 Document Reviewed: 10/10/2010 [facility] Patient Information 2015 [place], [place]. This information is not intended to replace advice given to you by your health care provider. Make sure you discuss any questions you have with your health care provider.

## 2015-09-15 NOTE — Progress Notes (Signed)
Pre visit review using our clinic review tool, if applicable. No additional management support is needed unless otherwise documented below in the visit note. 

## 2015-09-15 NOTE — Assessment & Plan Note (Addendum)
Immunization Status: Tdap--04/11/11; Prevnar --12/11/13;  pnm shot 2010; Shingles--04/14/14 No further PAPs, see previous entries    MMG--  s/p breast bx 12-2013 (-), neg MMG 2016  CCS--Cscope 01/12/14- with Zenovia Jarred, MD   Negative; no further cscope d/t age  Doing great w/ diet, unable to exercise

## 2015-09-15 NOTE — Progress Notes (Signed)
Subjective:    Patient ID: [name], female    DOB: [birth year], 82 y.o.   MRN: [id number]  DOS:  09/15/2015 Type of visit - description :    Here for Medicare AWV: 1. Risk factors based on Past M, S, F history: reviewed 2. Physical Activities: sedentary d/t med issues  3. Depression/mood: neg screening   4. Hearing:  L decreased hearing, saw audiology, rec hearing aids, declined so far   5. ADL's: needs help (husband) to take showers, able to self dress except for shoes, does not drive   6. Fall Risk: no recent falls, prevention discussed  , uses a Rolator  7. home Safety: does feel safe at home   8. Height, weight, & visual acuity: see VS, sees eye doctor regulalrly 9. Counseling: provided 10. Labs ordered based on risk factors: if needed   11. Referral Coordination: if needed 12. Care Plan, see assessment and plan , written personalized plan provided   13. Cognitive Assessment: motor skills decreased d/t to DJD and cognition appropriate for age 80. Care team updated 15. End-of-life care discussed , has a HC-POA  In addition, today we discussed the following: COPD: Good compliance of medication, currently asymptomatic HTN: Good compliance of medication, ambulatory BPs usually within normal Chronic fatigue, hypersomnolence: On dextroamphetamine, somnolence is well-controlled, she continue to feeling fatigue. GERD: As long as she takes Zantac, symptoms are well-controlled Complaining of weight gain despite eating healthy. Has not been able to exercise much  Wt Readings from Last 3 Encounters:  09/15/15 134 lb 4 oz (60.895 kg)  06/18/15 130 lb (58.968 kg)  06/14/15 131 lb 6 oz (59.591 kg)     Review of Systems  Constitutional: No fever. No chills.  No unusual sweats  HEENT: No dental problems, no ear discharge, no facial swelling, no voice changes. No eye discharge, no eye  redness , no  intolerance to light   Respiratory: No wheezing , no  difficulty breathing. No  cough , no mucus production  Cardiovascular: No CP, no leg swelling , no  Palpitations  GI: no nausea, no vomiting, no diarrhea , no  abdominal pain.  No blood in the stools. No dysphagia, no odynophagia    Endocrine: No polyphagia, no polyuria , no polydipsia  GU: No dysuria, gross hematuria, difficulty urinating. No urinary urgency, no frequency.  Musculoskeletal: No joint swellings or unusual aches or pains  Skin: No change in the color of the skin, palor , no  Rash. Chronic generalized itching, already assessed by Dr. Allyson Sabal  Allergic, immunologic: No environmental allergies , no  food allergies  Neurological: No dizziness no  syncope. No headaches. No diplopia, no slurred, no slurred speech, no motor deficits, no facial  Numbness  Hematological: No enlarged lymph nodes, no easy bruising , no unusual bleedings  Psychiatry: No suicidal ideas, no hallucinations, no beavior problems, no confusion.  No unusual/severe anxiety, no depression   Past Medical History  Diagnosis Date  . Anemia   . Asthma -COPD   . Chronic fatigue syndrome   . GERD (gastroesophageal reflux disease)   . Hyperlipidemia   . Hypertension   . Colon polyp   . Allergic rhinitis   . Vitamin D deficiency   . Pulmonary embolism ([place])     1980s  . Arthritis   . Chronic inflammatory demyelinating polyneuropathy ([place]) 03/2011  . Blood transfusion 1957  . Osteoporosis   . Hypothyroid   . Constipation   . Seizures ([place])  1990 last seizures on meds Phenobarb  . Skin cancer     squamous cell  . Neurogenic bladder 2013    husband caths pt, manages with timed void  . chronic sinusitis 08/08/2012  . Shingles late 76's  . Cystocele 09/16/2012  . Tubular adenoma   . Breast mass     right breast in milk duct, bx neg  . Thyroid nodule     Right thyroid lobe, only seen on Sagittal imaging measures 2.3 cm in craniocaudal dimension and appears stable  . Renal cyst     Non-complex, contrast MRI 2013 with L>R  non-complex cysts, dominant 3.7 left lower pole  . COPD (chronic obstructive pulmonary disease) [facility])     Past Surgical History  Procedure Laterality Date  . Tonsillectomy and adenoidectomy    . Nasal septum surgery    . Dilation and curettage of uterus    . Tubal ligation    . Cesarean section    . Sinus surgeries      x 4  . Left ovary and tube removed    . Vocal polyps removed    . Appendectomy    . Cystocele repair    . Carpal tunnel release      right  . Shoulder arthroscopy      x2 left, 1 right  . Left finger fusion      3 fingers on left hand/one finger right  . Right median nerve decompression    . Duptyren's contracture right hand    . Finger ganglion cyst excision      right  . Abdominal hysterectomy    . Joint replacement      right and left basal joints of thumbs  . Bladder suspension    . Cataract extraction      bilateral  . Cervical neck ablation      x 7, C3-C6/3 screws and plate  . Squamous lesions removed      neck and face  . Basal cell carcinoma excision      face  . Panniculectomy    . Nasal polyp surgery      4 sinus surgeries  . Anterior fusion clivus-c2 extraoral w/ odontoid excision  8/14  . Proximal interphalangeal fusion (pip) Left 09/09/2013    Procedure: FUSION LEFT INDEX PROXIMAL INTERPHALANGEAL JOINT (PIP);  Surgeon: Cammie Sickle., MD;  Location: [facility];  Service: Orthopedics;  Laterality: Left;  . Breast ductal system excision Right 01/15/2014    Procedure: EXCISION DUCTAL SYSTEM RIGHT BREAST;  Surgeon: Stark Klein, MD;  Location: [place];  Service: General;  Laterality: Right;  . Finger arthrodesis Right 05/06/2015    Procedure: RIGHT RING PROXIMAL INTERPHALANGEAL FUSION (PIP);  Surgeon: Leanora Cover, MD;  Location: [place];  Service: Orthopedics;  Laterality: Right;  . Steriod injection Right 05/06/2015    Procedure: STEROID INJECTION;  Surgeon: Leanora Cover, MD;  Location:  [place];  Service: Orthopedics;  Laterality: Right;  Right Index Finger Proximal InterPhalangeal Joint Injection   Family History  Problem Relation Age of Onset  . Heart disease Mother   . Hyperlipidemia Mother     Jerilynn Mages, aunt  . Ovarian cancer Sister   . Lung cancer Sister     lung - stage 1  . Thyroid cancer Sister   . Colon cancer Father     44  . Stomach cancer Other     first cousin  . Skin cancer  Daughter   . Malignant hyperthermia Cousin   . Breast cancer Neg Hx     Social History   Social History  . Marital Status: Married    Spouse Name: N/A  . Number of Children: 3  . Years of Education: N/A   Occupational History  . retired, Licensed conveyancer    Social History Main Topics  . Smoking status: Former Smoker -- 0.50 packs/day for 3 years    Types: Cigarettes    Quit date: 05/29/1978  . Smokeless tobacco: Never Used     Comment: quit smoking 35 years ago  . Alcohol Use: No  . Drug Use: No  . Sexual Activity: Yes   Other Topics Concern  . Not on file   Social History Narrative   Pt lives at home with her spouse.   Moved to [place] ~ 2004   Son is a Pharmacist, community in town   Son in [place]   Daughter in [place]           Medication List       This list is accurate as of: 09/15/15 11:59 PM.  Always use your most recent med list.               albuterol 108 (90 Base) MCG/ACT inhaler  Commonly known as:  PROAIR HFA  Inhale 1 puff into the lungs as needed.     amLODipine 10 MG tablet  Commonly known as:  NORVASC  TAKE ONE (1) TABLET EACH DAY     arformoterol 15 MCG/2ML Nebu  Commonly known as:  BROVANA  Take 2 mLs (15 mcg total) by nebulization 2 (two) times daily.     atorvastatin 40 MG tablet  Commonly known as:  LIPITOR  Take 1 tablet (40 mg total) by mouth daily at 12 noon.     budesonide 0.25 MG/2ML nebulizer solution  Commonly known as:  PULMICORT  Take 2 mLs (0.25 mg total) by nebulization 2 (two) times daily.      CALCIUM-VITAMIN D PO  Take 1 tablet by mouth daily.     dextroamphetamine 10 MG tablet  Commonly known as:  DEXTROSTAT  Take 1 tablet (10 mg total) by mouth 4 (four) times daily.     dextroamphetamine 10 MG tablet  Commonly known as:  DEXTROSTAT  Take 1 tablet (10 mg total) by mouth 4 (four) times daily.     levothyroxine 25 MCG tablet  Commonly known as:  SYNTHROID, LEVOTHROID  Take 1.5 tablets (37.5 mcg total) by mouth daily before breakfast.     lidocaine 4 % cream  Commonly known as:  LMX  Apply 1 application topically at bedtime.     mometasone 50 MCG/ACT nasal spray  Commonly known as:  NASONEX  PLACE 2 SPRAYS INTO THE NOSE DAILY     montelukast 10 MG tablet  Commonly known as:  SINGULAIR  Take 1 tablet (10 mg total) by mouth at bedtime.     PHENobarbital 32.4 MG tablet  Commonly known as:  LUMINAL  Take 1 tablet by mouth daily at 6 AM, NOON, and 2 tablets by mouth at 8 PM.     polyethylene glycol packet  Commonly known as:  MIRALAX / GLYCOLAX  Take 17 g by mouth at bedtime.     predniSONE 20 MG tablet  Commonly known as:  DELTASONE  Take 1 tablet (20 mg total) by mouth daily.     ranitidine 150 MG capsule  Commonly known as:  ZANTAC  Take 150 mg by mouth daily. Reported on 09/14/2015     TEGRETOL-XR 100 MG 12 hr tablet  Generic drug:  carbamazepine  Take 500 mg by mouth daily.     VITAMIN C PO  Take 2 capsules by mouth daily.     Vitamin D 2000 units Caps  Take 1 capsule by mouth daily. Reported on 09/14/2015     VOLTAREN 1 % Gel  Generic drug:  diclofenac sodium  Apply 1 application topically 4 (four) times daily as needed. Applies to fingers and hands for gout.           Objective:   Physical Exam BP 116/74 mmHg  Pulse 95  Temp(Src) 98.1 F (36.7 C) (Oral)  Ht 5\' 2"  (1.575 m)  Wt 134 lb 4 oz (60.895 kg)  BMI 24.55 kg/m2  SpO2 95%  General:   Well developed, well nourished . NAD.    HEENT:  Normocephalic . Face symmetric,  atraumatic Lungs:  Decreased breath sounds but otherwise clear Normal respiratory effort, no intercostal retractions, no accessory muscle use. Heart: RRR,  no murmur.  No pretibial edema bilaterally  Abdomen:  Not distended, soft, non-tender. No rebound or rigidity.   Skin: Exposed areas without rash.  Neurologic:  alert & oriented X3.  Speech normal Gait: Severely limited, uses Rollator appropriately, needs help with transferring. Psych: Cognition and judgment appear intact.  Cooperative with normal attention span and concentration.  Behavior appropriate. No anxious or depressed appearing.    Assessment & Plan:    Assessment HTN Hyperlipidemia Hypothyroidism thyroid nodule -- per MRI @ [facility], [place] confirmatory, Bx 10-2014 BENIGN THYROID NODULE (B[place]ES[place] CATEGORY II). Hypersomnia -- dextroamphetamine rx by pcp Chronic fatigue syndrome, h/o sleep studies (-) DJD Pulmonary:  ---Asthma, COPD on chronic prednisone ; Dr Jarome Lamas and [place] ---h/o PE in the 80s GERD, GI Dr Hilarie Fredrickson  Osteoporosis  ----T score 2014 -3.0, T score 07-2014 -2.0 ----H/o fosamax x years, Forteo qd (no help per pt) ; on prolia since ~ 2016 Neuro: --ATAXIA/ CIPD--Chronic inflammatory demyelinating polyneuropathy dx ~2014, on tegretol by neurology Dr Justin Mend @ [place] --Uses a walker since approximately 02-2015 --H/o Seizures  (on phenobarbital, rx by pcp, had last sz in the 90s when she was trying to d/c phenobarbital) --Neurogenic bladder- self catheterize prn,h/o urinary retention, prolapse, sees neurology Pain mngmt:   Used to take pain meds for neck pain, cervical myelopathy , better after surgery; intolerant to morphine, able to tolerate oxycodone Vitamin D deficiency H/o Skin cancer DR Allyson Sabal H/o pulmonary emboli 1980s  Plan: HTN: Continue amlodipine, recent BMP satisfactory Hyperlipidemia: Continue Lipitor, check a FLP, AST, ALT Hypothyroidism: Last TSH satisfactory, recheck. Continue  levothyroxine Osteoporosis: Prolia injection today, next in 6 months MSK:   mobility is limited by DJD, ataxia, CIPD Chronic fatigue syndrome: Ongoing fatigue. Vitamin D has been normal, last vitamin D was in the low side. Recheck a B12 RTC 6 months

## 2015-09-16 ENCOUNTER — Encounter: Payer: Self-pay | Admitting: Internal Medicine

## 2015-09-16 DIAGNOSIS — Z09 Encounter for follow-up examination after completed treatment for conditions other than malignant neoplasm: Secondary | ICD-10-CM | POA: Insufficient documentation

## 2015-09-16 LAB — AST: AST: 33 U/L (ref 0–37)

## 2015-09-16 LAB — CBC WITH DIFFERENTIAL/PLATELET
Basophils Absolute: 0 10*3/uL (ref 0.0–0.1)
Basophils Relative: 0.3 % (ref 0.0–3.0)
Eosinophils Absolute: 0 10*3/uL (ref 0.0–0.7)
Eosinophils Relative: 0.4 % (ref 0.0–5.0)
HCT: 42.2 % (ref 36.0–46.0)
Hemoglobin: 14 g/dL (ref 12.0–15.0)
Lymphocytes Relative: 17.4 % (ref 12.0–46.0)
Lymphs Abs: 1 10*3/uL (ref 0.7–4.0)
MCHC: 33.2 g/dL (ref 30.0–36.0)
MCV: 92.8 fl (ref 78.0–100.0)
Monocytes Absolute: 0.2 10*3/uL (ref 0.1–1.0)
Monocytes Relative: 2.6 % — ABNORMAL LOW (ref 3.0–12.0)
Neutro Abs: 4.7 10*3/uL (ref 1.4–7.7)
Neutrophils Relative %: 79.3 % — ABNORMAL HIGH (ref 43.0–77.0)
Platelets: 288 10*3/uL (ref 150.0–400.0)
RBC: 4.55 Mil/uL (ref 3.87–5.11)
RDW: 13.3 % (ref 11.5–15.5)
WBC: 5.9 10*3/uL (ref 4.0–10.5)

## 2015-09-16 LAB — LIPID PANEL
Cholesterol: 202 mg/dL — ABNORMAL HIGH (ref 0–200)
HDL: 80 mg/dL (ref >39.00)
LDL Cholesterol: 111 mg/dL — ABNORMAL HIGH (ref 0–99)
NonHDL: 122
Total CHOL/HDL Ratio: 3
Triglycerides: 53 mg/dL (ref 0.0–149.0)
VLDL: 10.6 mg/dL (ref 0.0–40.0)

## 2015-09-16 LAB — TSH: TSH: 0.99 u[IU]/mL (ref 0.35–4.50)

## 2015-09-16 LAB — VITAMIN B12: Vitamin B-12: 192 pg/mL — ABNORMAL LOW (ref 211–911)

## 2015-09-16 LAB — ALT: ALT: 33 U/L (ref 0–35)

## 2015-09-16 NOTE — Assessment & Plan Note (Signed)
HTN: Continue amlodipine, recent BMP satisfactory Hyperlipidemia: Continue Lipitor, check a FLP, AST, ALT Hypothyroidism: Last TSH satisfactory, recheck. Continue levothyroxine Osteoporosis: Prolia injection today, next in 6 months MSK:   mobility is limited by DJD, ataxia, CIPD Chronic fatigue syndrome: Ongoing fatigue. Vitamin D has been normal, last vitamin D was in the low side. Recheck a B12 RTC 6 months

## 2015-09-21 ENCOUNTER — Ambulatory Visit (INDEPENDENT_AMBULATORY_CARE_PROVIDER_SITE_OTHER): Payer: Medicare Other | Admitting: *Deleted

## 2015-09-21 DIAGNOSIS — E538 Deficiency of other specified B group vitamins: Secondary | ICD-10-CM

## 2015-09-21 MED ORDER — CYANOCOBALAMIN 1000 MCG/ML IJ SOLN
1000.0000 ug | Freq: Once | INTRAMUSCULAR | Status: AC
  Administered 2015-09-21: 1000 ug via INTRAMUSCULAR

## 2015-09-21 NOTE — Progress Notes (Signed)
Pre visit review using our clinic review tool, if applicable. No additional management support is needed unless otherwise documented below in the visit note.  Pt tolerated injection well.   Next appt: 09/28/15  Dorrene German, RN

## 2015-09-27 ENCOUNTER — Encounter: Payer: Self-pay | Admitting: Internal Medicine

## 2015-09-27 ENCOUNTER — Ambulatory Visit: Payer: Medicare Other | Admitting: Internal Medicine

## 2015-09-28 ENCOUNTER — Other Ambulatory Visit: Payer: Self-pay | Admitting: Internal Medicine

## 2015-09-28 ENCOUNTER — Ambulatory Visit (INDEPENDENT_AMBULATORY_CARE_PROVIDER_SITE_OTHER): Payer: Medicare Other | Admitting: *Deleted

## 2015-09-28 DIAGNOSIS — E538 Deficiency of other specified B group vitamins: Secondary | ICD-10-CM | POA: Diagnosis not present

## 2015-09-28 MED ORDER — CYANOCOBALAMIN 1000 MCG/ML IJ SOLN
1000.0000 ug | Freq: Once | INTRAMUSCULAR | Status: AC
  Administered 2015-09-28: 1000 ug via INTRAMUSCULAR

## 2015-09-28 NOTE — Telephone Encounter (Signed)
Requesting Phenobarbital 32.4mg -Take 1 tablet by mouth daily at 6 am and noon and take 2 tablets by mouth at 8 pm. Last refill:04-27-15;#120,5 Last OV:09-15-15 Please advise.//AB/CMA

## 2015-09-28 NOTE — Progress Notes (Signed)
Pre visit review using our clinic review tool, if applicable. No additional management support is needed unless otherwise documented below in the visit note.  Pt tolerated injection well.   Next appt: 10/05/15  Dorrene German

## 2015-09-29 ENCOUNTER — Other Ambulatory Visit: Payer: Self-pay | Admitting: *Deleted

## 2015-09-29 DIAGNOSIS — H10413 Chronic giant papillary conjunctivitis, bilateral: Secondary | ICD-10-CM | POA: Diagnosis not present

## 2015-09-29 DIAGNOSIS — H52223 Regular astigmatism, bilateral: Secondary | ICD-10-CM | POA: Diagnosis not present

## 2015-09-29 DIAGNOSIS — H40013 Open angle with borderline findings, low risk, bilateral: Secondary | ICD-10-CM | POA: Diagnosis not present

## 2015-09-29 DIAGNOSIS — H04123 Dry eye syndrome of bilateral lacrimal glands: Secondary | ICD-10-CM | POA: Diagnosis not present

## 2015-09-29 DIAGNOSIS — H02055 Trichiasis without entropian left lower eyelid: Secondary | ICD-10-CM | POA: Diagnosis not present

## 2015-09-29 DIAGNOSIS — H524 Presbyopia: Secondary | ICD-10-CM | POA: Diagnosis not present

## 2015-09-29 MED ORDER — ATORVASTATIN CALCIUM 40 MG PO TABS: 40.0000 mg | ORAL_TABLET | Freq: Every day | ORAL | Status: DC

## 2015-09-29 NOTE — Telephone Encounter (Signed)
Rx sent to the pharmacy by e-script.//AB/CMA 

## 2015-09-29 NOTE — Telephone Encounter (Signed)
done

## 2015-09-30 ENCOUNTER — Other Ambulatory Visit: Payer: Medicare Other

## 2015-09-30 ENCOUNTER — Other Ambulatory Visit: Payer: Self-pay | Admitting: Internal Medicine

## 2015-09-30 DIAGNOSIS — R5382 Chronic fatigue, unspecified: Secondary | ICD-10-CM

## 2015-09-30 DIAGNOSIS — R799 Abnormal finding of blood chemistry, unspecified: Secondary | ICD-10-CM | POA: Diagnosis not present

## 2015-10-01 LAB — CORTISOL-AM, BLOOD: Cortisol - AM: 9 ug/dL

## 2015-10-01 LAB — DHEA-SULFATE: DHEA-SO4: <2 ug/dL — ABNORMAL LOW (ref 7–177)

## 2015-10-04 LAB — ACTH: C206 ACTH: 6 pg/mL (ref 6–50)

## 2015-10-05 ENCOUNTER — Encounter: Payer: Self-pay | Admitting: Internal Medicine

## 2015-10-05 ENCOUNTER — Ambulatory Visit (INDEPENDENT_AMBULATORY_CARE_PROVIDER_SITE_OTHER): Payer: Medicare Other | Admitting: *Deleted

## 2015-10-05 DIAGNOSIS — E538 Deficiency of other specified B group vitamins: Secondary | ICD-10-CM

## 2015-10-05 MED ORDER — CYANOCOBALAMIN 1000 MCG/ML IJ SOLN
1000.0000 ug | Freq: Once | INTRAMUSCULAR | Status: AC
  Administered 2015-10-05: 1000 ug via INTRAMUSCULAR

## 2015-10-05 NOTE — Progress Notes (Signed)
Pre visit review using our clinic review tool, if applicable. No additional management support is needed unless otherwise documented below in the visit note.  Pt tolerated injection well.   Next appt: 10/12/15  Dorrene German, RN

## 2015-10-06 ENCOUNTER — Encounter: Payer: Self-pay | Admitting: Internal Medicine

## 2015-10-12 ENCOUNTER — Ambulatory Visit (INDEPENDENT_AMBULATORY_CARE_PROVIDER_SITE_OTHER): Payer: Medicare Other | Admitting: Behavioral Health

## 2015-10-12 ENCOUNTER — Other Ambulatory Visit: Payer: Self-pay | Admitting: Internal Medicine

## 2015-10-12 ENCOUNTER — Encounter: Payer: Self-pay | Admitting: Internal Medicine

## 2015-10-12 DIAGNOSIS — E538 Deficiency of other specified B group vitamins: Secondary | ICD-10-CM | POA: Diagnosis not present

## 2015-10-12 MED ORDER — TRIAMCINOLONE ACETONIDE 0.1 % MT PSTE: 1.0000 "application " | PASTE | Freq: Two times a day (BID) | OROMUCOSAL | Status: DC

## 2015-10-12 MED ORDER — CYANOCOBALAMIN 1000 MCG/ML IJ SOLN
1000.0000 ug | Freq: Once | INTRAMUSCULAR | Status: AC
  Administered 2015-10-12: 1000 ug via INTRAMUSCULAR

## 2015-10-12 NOTE — Progress Notes (Signed)
Pre visit review using our clinic review tool, if applicable. No additional management support is needed unless otherwise documented below in the visit note.  Patient in clinic today for B12 injection. IM given in Left Deltoid. Patient tolerated injection well. Next appointment scheduled for 11/12/15 at 2:00 PM.

## 2015-10-21 ENCOUNTER — Ambulatory Visit (INDEPENDENT_AMBULATORY_CARE_PROVIDER_SITE_OTHER): Payer: Medicare Other | Admitting: Adult Health

## 2015-10-21 ENCOUNTER — Encounter: Payer: Self-pay | Admitting: Adult Health

## 2015-10-21 VITALS — BP 133/72 | HR 89 | Ht 62.0 in | Wt 131.0 lb

## 2015-10-21 DIAGNOSIS — J449 Chronic obstructive pulmonary disease, unspecified: Secondary | ICD-10-CM

## 2015-10-21 MED ORDER — PREDNISONE 20 MG PO TABS: 20.0000 mg | ORAL_TABLET | Freq: Every day | ORAL | Status: DC

## 2015-10-21 MED ORDER — PREDNISONE 10 MG PO TABS: 15.0000 mg | ORAL_TABLET | Freq: Every day | ORAL | Status: DC

## 2015-10-21 NOTE — Progress Notes (Signed)
Subjective:    Patient ID: [name], female    DOB: [birth year], 82 y.o.   MRN: [id number]  HPI 82 yo female former smoker with GOLD B COPD , steroid dependent   TEST  Golds B   Copd not oxygen dependent but steroid dependent 2013 :FeV1 53%  Fef 25 75 20% 10/15/2014: FeV1 71%  Significant lifelong asthmatic atopic features   10/21/2015 Follow up : COPD /steriod dependent  Pt returns for 1 year follow up  Says she is doing well. No flare of cough or wheezing . No increased SABA use. No rx for abx, steroids for last year.  Remains on Brovana and Pulmicort neb Twice daily  , singulair daily  Has been on steroids for >60yrs. Currently on prednisone 20mg  daily  Has been on higher doses in past but last year 20mg  daily  Has not tried to go below this dose .  She Denies any chest pain, orthopnea, PND or leg swelling  Past Medical History  Diagnosis Date  . Anemia   . Asthma -COPD   . Chronic fatigue syndrome   . GERD (gastroesophageal reflux disease)   . Hyperlipidemia   . Hypertension   . Colon polyp   . Allergic rhinitis   . Vitamin D deficiency   . Pulmonary embolism ([place])     1980s  . Arthritis   . Chronic inflammatory demyelinating polyneuropathy ([place]) 03/2011  . Blood transfusion 1957  . Osteoporosis   . Hypothyroid   . Constipation   . Seizures ([place])     1990 last seizures on meds Phenobarb  . Skin cancer     squamous cell  . Neurogenic bladder 2013    husband caths pt, manages with timed void  . chronic sinusitis 08/08/2012  . Shingles late 19's  . Cystocele 09/16/2012  . Tubular adenoma   . Breast mass     right breast in milk duct, bx neg  . Thyroid nodule     Right thyroid lobe, only seen on Sagittal imaging measures 2.3 cm in craniocaudal dimension and appears stable  . Renal cyst     Non-complex, contrast MRI 2013 with L>R non-complex cysts, dominant 3.7 left lower pole  . COPD (chronic obstructive pulmonary disease) ([place])    Current Outpatient  Prescriptions on File Prior to Visit  Medication Sig Dispense Refill  . albuterol (PROAIR HFA) 108 (90 BASE) MCG/ACT inhaler Inhale 1 puff into the lungs as needed. 18 g 4  . amLODipine (NORVASC) 10 MG tablet TAKE ONE (1) TABLET EACH DAY 30 tablet 1  . arformoterol (BROVANA) 15 MCG/2ML NEBU Take 2 mLs (15 mcg total) by nebulization 2 (two) times daily. 120 mL 11  . Ascorbic Acid (VITAMIN C PO) Take 2 capsules by mouth daily.    Marland Kitchen atorvastatin (LIPITOR) 40 MG tablet Take 1 tablet (40 mg total) by mouth daily at 12 noon. 90 tablet 2  . budesonide (PULMICORT) 0.25 MG/2ML nebulizer solution Take 2 mLs (0.25 mg total) by nebulization 2 (two) times daily. 60 mL 11  . CALCIUM-VITAMIN D PO Take 1 tablet by mouth daily.     Marland Kitchen dextroamphetamine (DEXTROSTAT) 10 MG tablet Take 1 tablet (10 mg total) by mouth 4 (four) times daily. 120 tablet 0  . levothyroxine (SYNTHROID, LEVOTHROID) 25 MCG tablet Take 1.5 tablets (37.5 mcg total) by mouth daily before breakfast. 45 tablet 3  . mometasone (NASONEX) 50 MCG/ACT nasal spray PLACE 2 SPRAYS INTO THE NOSE DAILY 17 g 6  .  montelukast (SINGULAIR) 10 MG tablet Take 1 tablet (10 mg total) by mouth at bedtime. 90 tablet 4  . PHENobarbital (LUMINAL) 32.4 MG tablet TAKE 1 TABLET BY MOUTH DAILY AT 6 AM AND NOON AND TAKE 2 TABLETS BY MOUTH AT 8 PM 120 tablet 5  . polyethylene glycol (MIRALAX / GLYCOLAX) packet Take 17 g by mouth at bedtime. 14 each   . TEGRETOL-XR 100 MG 12 hr tablet Take 500 mg by mouth daily.   1   No current facility-administered medications on file prior to visit.       Review of Systems Constitutional:   No  weight loss, night sweats,  Fevers, chills, fatigue, or  lassitude.  HEENT:   No headaches,  Difficulty swallowing,  Tooth/dental problems, or  Sore throat,                No sneezing, itching, ear ache, nasal congestion, post nasal drip,   CV:  No chest pain,  Orthopnea, PND, swelling in lower extremities, anasarca, dizziness,  palpitations, syncope.   GI  No heartburn, indigestion, abdominal pain, nausea, vomiting, diarrhea, change in bowel habits, loss of appetite, bloody stools.   Resp: No shortness of breath with exertion or at rest.  No excess mucus, no productive cough,  No non-productive cough,  No coughing up of blood.  No change in color of mucus.  No wheezing.  No chest wall deformity  Skin: no rash or lesions.  GU: no dysuria, change in color of urine, no urgency or frequency.  No flank pain, no hematuria   MS:  No joint pain or swelling.  No decreased range of motion.  No back pain.  Psych:  No change in mood or affect. No depression or anxiety.  No memory loss.         Objective:   Physical Exam Filed Vitals:   10/21/15 1205  BP: 133/72  Pulse: 89  Height: 5\' 2"  (1.575 m)  Weight: 131 lb (59.421 kg)  SpO2: 96%   GEN: A/Ox3; pleasant , NAD, elderly , walks with walker   HEENT:  [place]/AT,  EACs-clear, TMs-wnl, NOSE-clear, THROAT-clear, no lesions, no postnasal drip or exudate noted.   NECK:  Supple w/ fair ROM; no JVD; normal carotid impulses w/o bruits; no thyromegaly or nodules palpated; no lymphadenopathy.  RESP  Clear  P & A; w/o, wheezes/ rales/ or rhonchi.no accessory muscle use, no dullness to percussion  CARD:  RRR, no m/r/g  , no peripheral edema, pulses intact, no cyanosis or clubbing.  GI:   Soft & nt; nml bowel sounds; no organomegaly or masses detected.  Musco: Warm bil, no deformities or joint swelling noted.  Arthritic changes   Neuro: alert, no focal deficits noted.    Skin: Warm, no lesions or rashes  Tammy Parrett NP-C  [facility]  10/21/2015        Assessment & Plan:

## 2015-10-21 NOTE — Assessment & Plan Note (Signed)
COPD, well controlled. Patient is chronically steroid dependent. Tried to very slowly go down on her prednisone to see if we could have a lower baseline dose.  Plan  ontinue on Brovana and Pulmicort Neb .Twice daily  .  Decrease Prednisone 15mg  alternating with 20mg  daily for 1 month and then 15mg  daily -hold at this dose Follow up with Dr. Elsworth Soho  In 3 months and As needed

## 2015-10-21 NOTE — Patient Instructions (Signed)
Continue on Brovana and Pulmicort Neb .Twice daily  .  Decrease Prednisone 15mg  alternating with 20mg  daily for 1 month and then 15mg  daily -hold at this dose Follow up with Dr. Elsworth Soho  In 3 months and As needed

## 2015-11-04 ENCOUNTER — Encounter: Payer: Self-pay | Admitting: Internal Medicine

## 2015-11-04 NOTE — Telephone Encounter (Signed)
Pt is requesting refill on Dextroamphetamine 10 mg.  Last OV: 09/15/2015 Last Fill: 09/06/2015 #120 and 0RF UDS: 01/01/2015 Low risk  Please advise.

## 2015-11-05 MED ORDER — DEXTROAMPHETAMINE SULFATE 10 MG PO TABS: 10.0000 mg | ORAL_TABLET | Freq: Four times a day (QID) | ORAL | Status: DC

## 2015-11-05 NOTE — Telephone Encounter (Signed)
Pt informed Rx's have been placed at front desk for pick up at her convenience.

## 2015-11-05 NOTE — Progress Notes (Signed)
Reviewed & agree with plan  

## 2015-11-05 NOTE — Telephone Encounter (Signed)
Rx's for June and July 2017 printed, awaiting MD signature.

## 2015-11-05 NOTE — Telephone Encounter (Signed)
Ok #120 x2 Rxs

## 2015-11-07 ENCOUNTER — Encounter: Payer: Self-pay | Admitting: Adult Health

## 2015-11-08 NOTE — Telephone Encounter (Signed)
[name], please see the email from pt below and advise any further recs thanks  Dear Lynelle Smoke-  I have been following your instructions regarding decreasing Prednisone 15 mg. alternating with my usual 20 mg. dose daily.  Even though I only started this new routine less than 3 1/2 weeks ago, I am noticing adverse reactions, i.e. needing to use my inhaler more frequently, shortness of breath, more tiredness than before; I am returning to 20 mg. daily effective tomorrow. If there is no improvement, I will see Dr.Alva. I am sure this is agreeable with you.  cc: Kathlene November, MD thanks, Pamala Hurry

## 2015-11-11 ENCOUNTER — Ambulatory Visit (INDEPENDENT_AMBULATORY_CARE_PROVIDER_SITE_OTHER): Payer: Medicare Other | Admitting: Behavioral Health

## 2015-11-11 DIAGNOSIS — E538 Deficiency of other specified B group vitamins: Secondary | ICD-10-CM | POA: Diagnosis not present

## 2015-11-11 MED ORDER — CYANOCOBALAMIN 1000 MCG/ML IJ SOLN
1000.0000 ug | Freq: Once | INTRAMUSCULAR | Status: AC
  Administered 2015-11-11: 1000 ug via INTRAMUSCULAR

## 2015-11-11 NOTE — Progress Notes (Signed)
Pre visit review using our clinic review tool, if applicable. No additional management support is needed unless otherwise documented below in the visit note.  Patient in office for B12 injection. IM given in Left Deltoid. Patient tolerated injection well. Next appointment scheduled for 12/14/15 at 2:00 PM.

## 2015-11-12 ENCOUNTER — Ambulatory Visit: Payer: Medicare Other

## 2015-11-23 DIAGNOSIS — L821 Other seborrheic keratosis: Secondary | ICD-10-CM | POA: Diagnosis not present

## 2015-11-23 DIAGNOSIS — L814 Other melanin hyperpigmentation: Secondary | ICD-10-CM | POA: Diagnosis not present

## 2015-12-01 ENCOUNTER — Encounter: Payer: Self-pay | Admitting: Internal Medicine

## 2015-12-01 MED ORDER — AMLODIPINE BESYLATE 10 MG PO TABS: 10.0000 mg | ORAL_TABLET | Freq: Every day | ORAL | Status: DC

## 2015-12-02 ENCOUNTER — Other Ambulatory Visit: Payer: Self-pay | Admitting: Internal Medicine

## 2015-12-13 ENCOUNTER — Other Ambulatory Visit: Payer: Self-pay | Admitting: Internal Medicine

## 2015-12-13 DIAGNOSIS — Z1231 Encounter for screening mammogram for malignant neoplasm of breast: Secondary | ICD-10-CM

## 2015-12-14 ENCOUNTER — Ambulatory Visit (INDEPENDENT_AMBULATORY_CARE_PROVIDER_SITE_OTHER): Payer: Medicare Other | Admitting: Behavioral Health

## 2015-12-14 DIAGNOSIS — E538 Deficiency of other specified B group vitamins: Secondary | ICD-10-CM

## 2015-12-14 MED ORDER — CYANOCOBALAMIN 1000 MCG/ML IJ SOLN
1000.0000 ug | Freq: Once | INTRAMUSCULAR | Status: AC
  Administered 2015-12-14: 1000 ug via INTRAMUSCULAR

## 2015-12-14 NOTE — Progress Notes (Signed)
Pre visit review using our clinic review tool, if applicable. No additional management support is needed unless otherwise documented below in the visit note.  Patient presents in office for B12 injection. IM given in Left Deltoid. Patient tolerated injection well. She will call the office later to schedule the next appointment.

## 2015-12-20 DIAGNOSIS — J45909 Unspecified asthma, uncomplicated: Secondary | ICD-10-CM | POA: Diagnosis not present

## 2015-12-20 DIAGNOSIS — J449 Chronic obstructive pulmonary disease, unspecified: Secondary | ICD-10-CM | POA: Diagnosis not present

## 2015-12-20 DIAGNOSIS — E538 Deficiency of other specified B group vitamins: Secondary | ICD-10-CM | POA: Diagnosis not present

## 2015-12-20 DIAGNOSIS — R0602 Shortness of breath: Secondary | ICD-10-CM | POA: Diagnosis not present

## 2015-12-21 DIAGNOSIS — E559 Vitamin D deficiency, unspecified: Secondary | ICD-10-CM | POA: Diagnosis not present

## 2015-12-21 DIAGNOSIS — E538 Deficiency of other specified B group vitamins: Secondary | ICD-10-CM | POA: Diagnosis not present

## 2015-12-21 DIAGNOSIS — I10 Essential (primary) hypertension: Secondary | ICD-10-CM | POA: Diagnosis not present

## 2015-12-29 ENCOUNTER — Other Ambulatory Visit: Payer: Self-pay | Admitting: Adult Health

## 2015-12-29 MED ORDER — MONTELUKAST SODIUM 10 MG PO TABS: 10.0000 mg | ORAL_TABLET | Freq: Every day | ORAL | 1 refills | Status: DC

## 2015-12-29 NOTE — Telephone Encounter (Signed)
Received paper refill from [facility] for  singular 10mg  #90 with 4 refills Last refilled 10/15/14  Refilled rx with 1 refill. Nothing further needed.

## 2016-01-03 ENCOUNTER — Telehealth: Payer: Self-pay

## 2016-01-03 NOTE — Telephone Encounter (Signed)
Received mailed letter from Pt, she is changing PCP to another provider at [facility]. Letter sent for scanning and Dr. Larose Kells removed as PCP.

## 2016-01-06 DIAGNOSIS — R252 Cramp and spasm: Secondary | ICD-10-CM | POA: Diagnosis not present

## 2016-01-06 DIAGNOSIS — M4802 Spinal stenosis, cervical region: Secondary | ICD-10-CM | POA: Diagnosis not present

## 2016-01-07 DIAGNOSIS — N319 Neuromuscular dysfunction of bladder, unspecified: Secondary | ICD-10-CM | POA: Diagnosis not present

## 2016-01-14 DIAGNOSIS — N202 Calculus of kidney with calculus of ureter: Secondary | ICD-10-CM | POA: Diagnosis not present

## 2016-01-14 DIAGNOSIS — N281 Cyst of kidney, acquired: Secondary | ICD-10-CM | POA: Diagnosis not present

## 2016-01-14 DIAGNOSIS — N319 Neuromuscular dysfunction of bladder, unspecified: Secondary | ICD-10-CM | POA: Diagnosis not present

## 2016-01-18 DIAGNOSIS — Z0000 Encounter for general adult medical examination without abnormal findings: Secondary | ICD-10-CM | POA: Diagnosis not present

## 2016-01-18 DIAGNOSIS — E559 Vitamin D deficiency, unspecified: Secondary | ICD-10-CM | POA: Diagnosis not present

## 2016-01-18 DIAGNOSIS — Z1389 Encounter for screening for other disorder: Secondary | ICD-10-CM | POA: Diagnosis not present

## 2016-01-18 DIAGNOSIS — R0602 Shortness of breath: Secondary | ICD-10-CM | POA: Diagnosis not present

## 2016-01-18 DIAGNOSIS — R5383 Other fatigue: Secondary | ICD-10-CM | POA: Diagnosis not present

## 2016-01-18 DIAGNOSIS — Z113 Encounter for screening for infections with a predominantly sexual mode of transmission: Secondary | ICD-10-CM | POA: Diagnosis not present

## 2016-01-18 DIAGNOSIS — E782 Mixed hyperlipidemia: Secondary | ICD-10-CM | POA: Diagnosis not present

## 2016-01-18 DIAGNOSIS — I10 Essential (primary) hypertension: Secondary | ICD-10-CM | POA: Diagnosis not present

## 2016-01-20 ENCOUNTER — Ambulatory Visit
Admission: RE | Admit: 2016-01-20 | Discharge: 2016-01-20 | Disposition: A | Discharge status: Home / Self Care | Payer: Medicare Other | Source: Ambulatory Visit | Attending: Internal Medicine | Admitting: Internal Medicine

## 2016-01-20 DIAGNOSIS — Z1231 Encounter for screening mammogram for malignant neoplasm of breast: Secondary | ICD-10-CM

## 2016-02-10 DIAGNOSIS — J111 Influenza due to unidentified influenza virus with other respiratory manifestations: Secondary | ICD-10-CM | POA: Diagnosis not present

## 2016-02-18 DIAGNOSIS — L600 Ingrowing nail: Secondary | ICD-10-CM | POA: Diagnosis not present

## 2016-03-04 IMAGING — US US THYROID BIOPSY
1 series · 10 of 10 positions shown · non-contrast
Comparison: Recent thyroid ultrasound

CLINICAL DATA: Bilateral dominant thyroid nodules

EXAM:
ULTRASOUND GUIDED NEEDLE ASPIRATE BIOPSY OF THE THYROID GLAND

[Series 1: us thyroid biopsy · 0.06mm/px · 10 acquisitions, 10 frames shown]
[im 1/10]
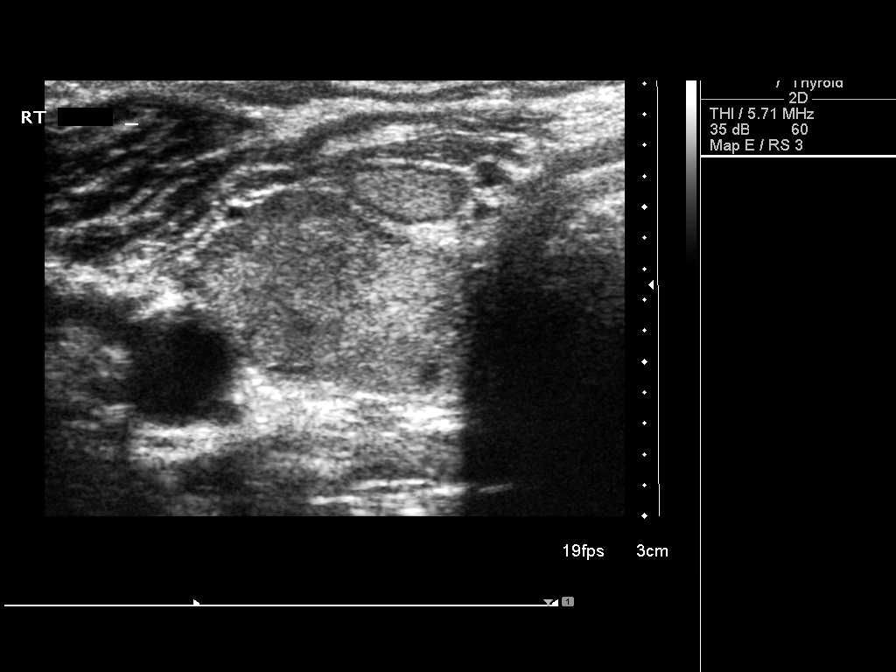
[im 2/10]
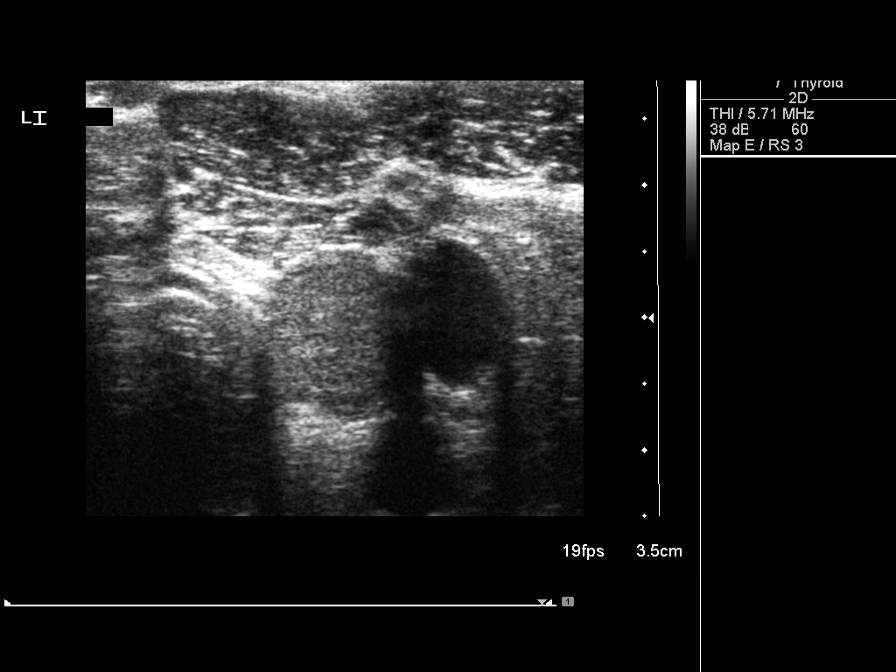
[im 3/10]
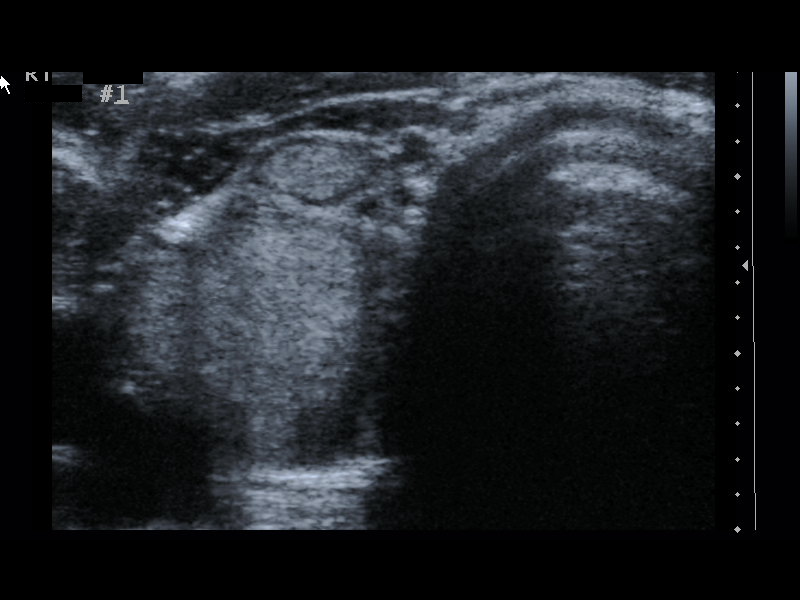
[im 4/10]
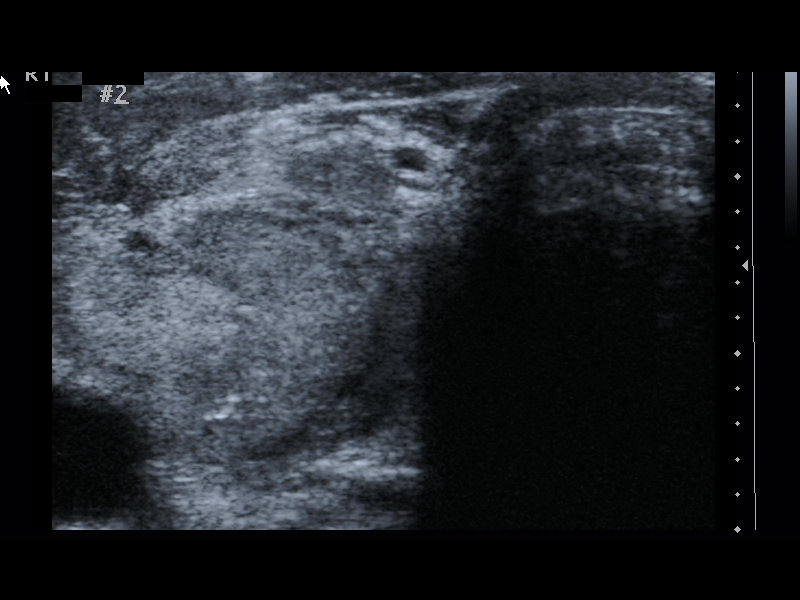
[im 5/10]
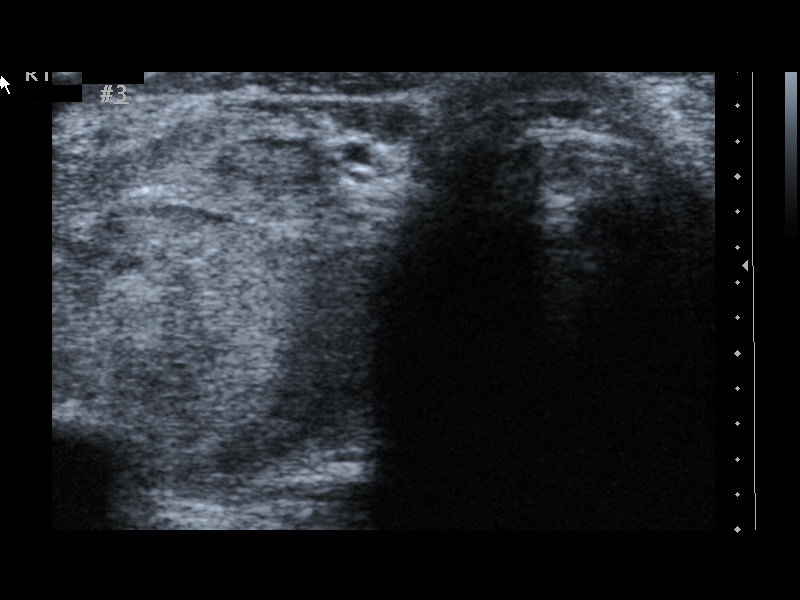
[im 6/10]
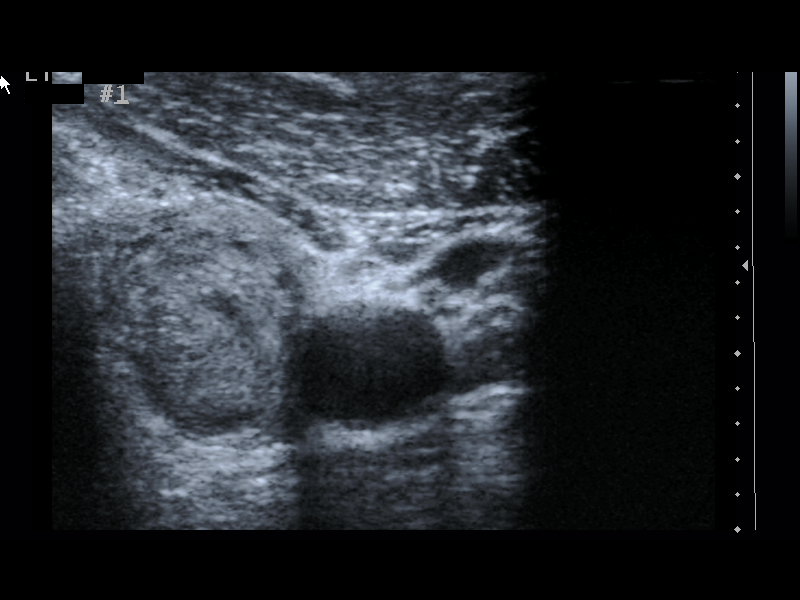
[im 7/10]
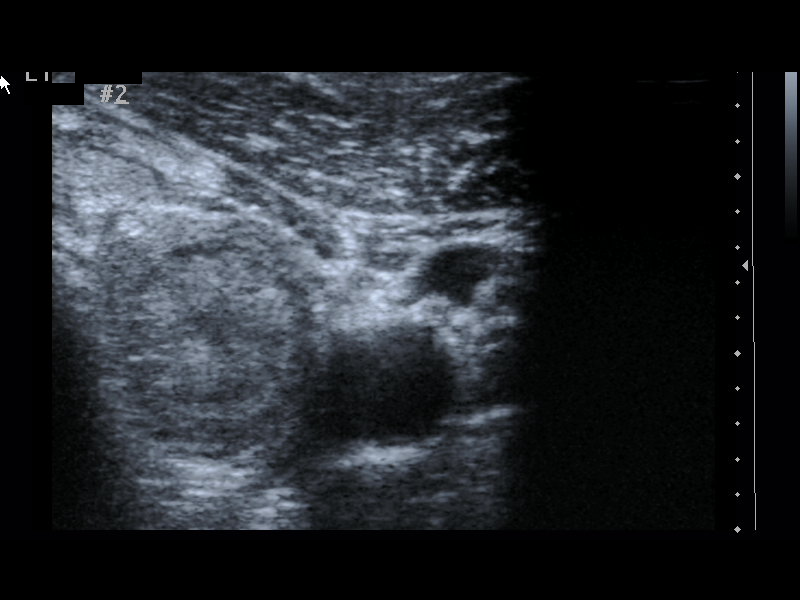
[im 8/10]
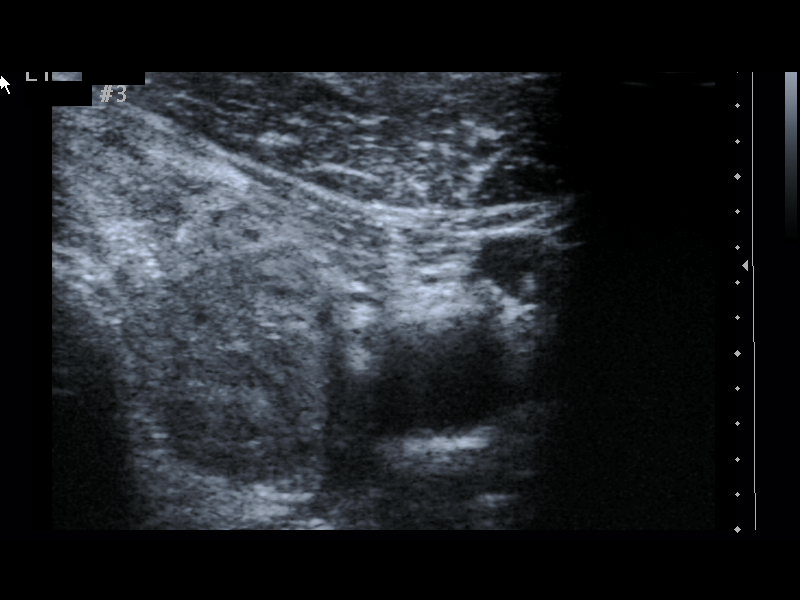
[im 9/10]
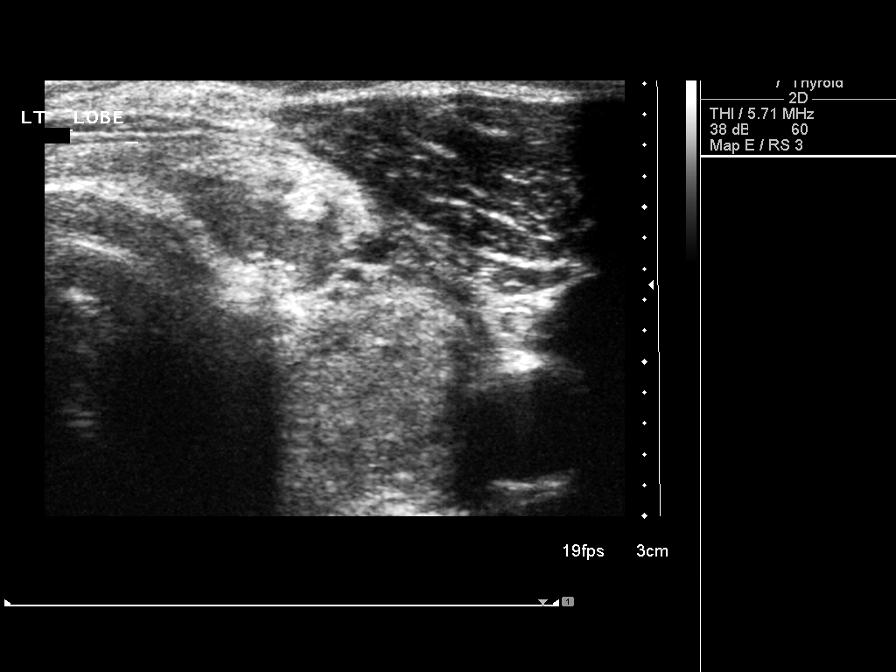
[im 10/10]
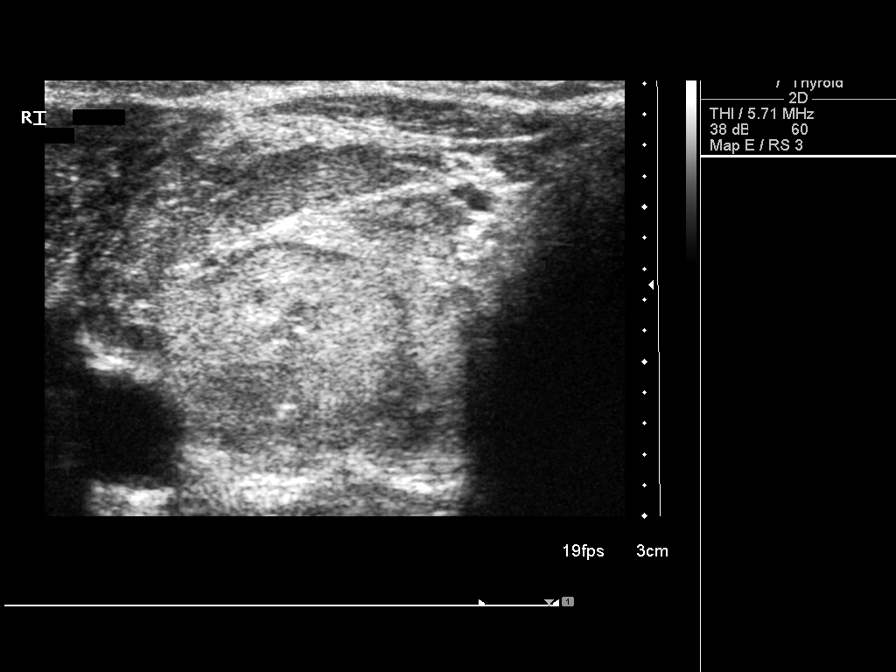

[10 of 10 positions shown; findings below may reference images not displayed]

PROCEDURE:
Thyroid biopsy was thoroughly discussed with the patient and
questions were answered. The benefits, risks, alternatives, and
complications were also discussed. The patient understands and
wishes to proceed with the procedure. Written consent was obtained.

Ultrasound was performed to localize and mark an adequate site for
the biopsy. The patient was then prepped and draped in a normal
sterile fashion. Local anesthesia was provided with 1% lidocaine.
Using direct ultrasound guidance, 3 passes were made using needles
into the nodule within the right lobe of the thyroid. Ultrasound was
used to confirm needle placements on all occasions. Specimens were
sent to Pathology for analysis.

Complications:  None immediate

Ultrasound was performed to localize and mark an adequate site for
the biopsy. The patient was then prepped and draped in a normal
sterile fashion. Local anesthesia was provided with 1% lidocaine.
Using direct ultrasound guidance, 3 passes were made using needles
into the nodule within the left lobe of the thyroid. Ultrasound was
used to confirm needle placements on all occasions. Specimens were
sent to Pathology for analysis.

Complications:  None immediate
FINDINGS: Imaging confirms needle placed in the dominant right thyroid nodule

Imaging confirms needle placement in the dominant left thyroid
nodule
IMPRESSION: Ultrasound guided needle aspirate biopsy performed of the dominant
right thyroid nodule.

Ultrasound guided needle aspirate biopsy performed of the dominant
left thyroid nodule.

## 2016-03-20 DIAGNOSIS — E039 Hypothyroidism, unspecified: Secondary | ICD-10-CM | POA: Diagnosis not present

## 2016-03-20 DIAGNOSIS — I10 Essential (primary) hypertension: Secondary | ICD-10-CM | POA: Diagnosis not present

## 2016-03-20 DIAGNOSIS — M810 Age-related osteoporosis without current pathological fracture: Secondary | ICD-10-CM | POA: Diagnosis not present

## 2016-03-20 DIAGNOSIS — R5382 Chronic fatigue, unspecified: Secondary | ICD-10-CM | POA: Diagnosis not present

## 2016-03-27 DIAGNOSIS — Z79899 Other long term (current) drug therapy: Secondary | ICD-10-CM | POA: Diagnosis not present

## 2016-03-27 DIAGNOSIS — E039 Hypothyroidism, unspecified: Secondary | ICD-10-CM | POA: Diagnosis not present

## 2016-03-30 ENCOUNTER — Ambulatory Visit: Payer: Medicare Other | Admitting: Internal Medicine

## 2016-04-03 DIAGNOSIS — H04123 Dry eye syndrome of bilateral lacrimal glands: Secondary | ICD-10-CM | POA: Diagnosis not present

## 2016-04-03 DIAGNOSIS — H40013 Open angle with borderline findings, low risk, bilateral: Secondary | ICD-10-CM | POA: Diagnosis not present

## 2016-04-03 DIAGNOSIS — H02055 Trichiasis without entropian left lower eyelid: Secondary | ICD-10-CM | POA: Diagnosis not present

## 2016-04-03 DIAGNOSIS — H52223 Regular astigmatism, bilateral: Secondary | ICD-10-CM | POA: Diagnosis not present

## 2016-04-03 DIAGNOSIS — H524 Presbyopia: Secondary | ICD-10-CM | POA: Diagnosis not present

## 2016-04-03 DIAGNOSIS — H10413 Chronic giant papillary conjunctivitis, bilateral: Secondary | ICD-10-CM | POA: Diagnosis not present

## 2016-04-04 DIAGNOSIS — L309 Dermatitis, unspecified: Secondary | ICD-10-CM | POA: Diagnosis not present

## 2016-04-28 DIAGNOSIS — E039 Hypothyroidism, unspecified: Secondary | ICD-10-CM | POA: Diagnosis not present

## 2016-04-28 DIAGNOSIS — I10 Essential (primary) hypertension: Secondary | ICD-10-CM | POA: Diagnosis not present

## 2016-04-28 DIAGNOSIS — R5382 Chronic fatigue, unspecified: Secondary | ICD-10-CM | POA: Diagnosis not present

## 2016-04-28 DIAGNOSIS — D649 Anemia, unspecified: Secondary | ICD-10-CM | POA: Diagnosis not present

## 2016-05-04 ENCOUNTER — Telehealth: Payer: Self-pay | Admitting: Internal Medicine

## 2016-05-04 NOTE — Telephone Encounter (Signed)
Patient states that she is now having daily heartburn again and wants to have new medication called to her pharmacy. I have advised her that per Dr Hilarie Fredrickson, she needs a follow up 1 year after her last visit (01-2015).  Dr Vena Rua first available is not until 07-07-15. I attempted to schedule patient to see one of our PA's. However, she only wants to see Dr Hilarie Fredrickson. I have again emphasized the importance of antireflux measures, head of bed elevation and avoiding late night meals/snacks, all of which patient says she follows. She does tell me that she has not been taking the ranitidine 150 mg twice daily as needed for reflux which is what Dr Hilarie Fredrickson recommended when she called last December. I have asked her to start back on this twice daily for a couple of weeks and then twice daily PRN after that until her office visit. She is to let us know if no improvement in her symptoms.

## 2016-05-05 DIAGNOSIS — M810 Age-related osteoporosis without current pathological fracture: Secondary | ICD-10-CM | POA: Diagnosis not present

## 2016-05-05 DIAGNOSIS — Z7951 Long term (current) use of inhaled steroids: Secondary | ICD-10-CM | POA: Diagnosis not present

## 2016-05-05 DIAGNOSIS — J449 Chronic obstructive pulmonary disease, unspecified: Secondary | ICD-10-CM | POA: Diagnosis not present

## 2016-06-20 DIAGNOSIS — J449 Chronic obstructive pulmonary disease, unspecified: Secondary | ICD-10-CM | POA: Diagnosis not present

## 2016-06-20 DIAGNOSIS — R5383 Other fatigue: Secondary | ICD-10-CM | POA: Diagnosis not present

## 2016-06-20 DIAGNOSIS — M25569 Pain in unspecified knee: Secondary | ICD-10-CM | POA: Diagnosis not present

## 2016-06-20 DIAGNOSIS — R5382 Chronic fatigue, unspecified: Secondary | ICD-10-CM | POA: Diagnosis not present

## 2016-06-29 DIAGNOSIS — E559 Vitamin D deficiency, unspecified: Secondary | ICD-10-CM | POA: Diagnosis not present

## 2016-06-29 DIAGNOSIS — E782 Mixed hyperlipidemia: Secondary | ICD-10-CM | POA: Diagnosis not present

## 2016-06-29 DIAGNOSIS — E039 Hypothyroidism, unspecified: Secondary | ICD-10-CM | POA: Diagnosis not present

## 2016-06-29 DIAGNOSIS — Z79899 Other long term (current) drug therapy: Secondary | ICD-10-CM | POA: Diagnosis not present

## 2016-07-06 ENCOUNTER — Ambulatory Visit (INDEPENDENT_AMBULATORY_CARE_PROVIDER_SITE_OTHER): Payer: Medicare Other | Admitting: Internal Medicine

## 2016-07-06 ENCOUNTER — Encounter: Payer: Self-pay | Admitting: Internal Medicine

## 2016-07-06 VITALS — BP 120/70 | HR 100 | Ht 61.75 in | Wt 135.0 lb

## 2016-07-06 DIAGNOSIS — K5909 Other constipation: Secondary | ICD-10-CM

## 2016-07-06 DIAGNOSIS — K219 Gastro-esophageal reflux disease without esophagitis: Secondary | ICD-10-CM

## 2016-07-06 MED ORDER — POLYETHYLENE GLYCOL 3350 17 GM/SCOOP PO POWD: ORAL | 3 refills | Status: DC

## 2016-07-06 MED ORDER — RANITIDINE HCL 150 MG PO TABS: 150.0000 mg | ORAL_TABLET | Freq: Two times a day (BID) | ORAL | 3 refills | Status: DC

## 2016-07-06 NOTE — Patient Instructions (Signed)
We have sent the following medications to your pharmacy for you to pick up at your convenience: Ranitidine 150 mg twice daily Miralax 1 capful daily  Please follow up with Dr Hilarie Fredrickson in 1-2 years.  If you are age 81 or older, your body mass index should be between 23-30. Your Body mass index is 24.89 kg/m. If this is out of the aforementioned range listed, please consider follow up with your Primary Care Provider.  If you are age 33 or younger, your body mass index should be between 19-25. Your Body mass index is 24.89 kg/m. If this is out of the aformentioned range listed, please consider follow up with your Primary Care Provider.

## 2016-07-07 NOTE — Progress Notes (Signed)
   Subjective:    Patient ID: [name], female    DOB: 01-22-1933, 83 y.o.   MRN: [id number]  HPI [name] is an 83 year old female with a history of large right-sided colon polyp status post right hemicolectomy in 2013, GERD, gastroparesis, chronic constipation with pelvic floor dysfunction, history of fundic gland polyp with low-grade dysplasia status post resection, CIDP, COPD, hypertension, hyperlipidemia and polyarthritis who is here for follow-up. She was last seen in September 2016.  She had called several months ago with a flare of her reflux, heartburn and regurgitation. We stopped Dexilant after her last visit which she did well after for many months. She had return of "horrible indigestion" and we recommended ranitidine which she has been using 150 twice a day. This has dramatically improved her symptoms. Her husband states that she will often skip doses since she feels better. She denies dysphagia and odynophagia. Weight is stable to slightly increased which she is concerned about. She feels "fat". No trouble swallowing. Bowel habits of been regular as long as she uses MiraLAX though she does often not have control of BM. This is long-standing. She wears adult diapers. She also has incontinence intermittently of urine. No blood in her stool or melena. No abdominal pain.  She is now following with primary care with Dr. Annetta Maw at [facility].  She continues to follow with [facility]  Review of Systems As per history of present illness, otherwise negative  Current Medications, Allergies, Past Medical History, Past Surgical History, Family History and Social History were reviewed in [employer] record.     Objective:   Physical Exam BP 120/70 (BP Location: Left Arm, Patient Position: Sitting, Cuff Size: Normal)   Pulse 100   Ht 5' 1.75" (1.568 m)   Wt 135 lb (61.2 kg)   BMI 24.89 kg/m  Constitutional: Well-developed and  well-nourished. No distress. HEENT: Normocephalic and atraumatic.  Conjunctivae are normal.  No scleral icterus. Neck: Neck supple. Trachea midline. Cardiovascular: Normal rate, regular rhythm and intact distal pulses. No M/R/G Pulmonary/chest: Effort normal and breath sounds normal. No wheezing, rales or rhonchi. Abdominal: Soft, nontender, nondistended. Bowel sounds active throughout.  Extremities: no clubbing, cyanosis, or edema, Changes of arthritis bilateral hands  Neurological: Alert and oriented to person place and time. Skin: Skin is warm and dry.  Psychiatric: Normal mood and affect. Behavior is normal.    Assessment & Plan:   83 year old female with a history of large right-sided colon polyp status post right hemicolectomy in 2013, GERD, gastroparesis, chronic constipation with pelvic floor dysfunction, history of fundic gland polyp with low-grade dysplasia status post resection, CIDP, COPD, hypertension, hyperlipidemia and polyarthritis who is here for follow-up.  1. GERD -- Flare of reflux disease off of therapy. Responding to ranitidine 150 twice a day. I recommended she continue this uninterrupted at 150 mg twice daily. GERD diet and reflux precautions discussed  2. Chronic constipation -- MiraLAX works well for her. She will continue 17 g daily also uninterrupted  3. History of colon polyps status post right hemicolectomy -- most recent colonoscopy in 2016 unremarkable, further screening/surveillance deferred due to age  4 minutes spent with the patient today. Greater than 50% was spent in counseling and coordination of care with the patient

## 2016-07-14 DIAGNOSIS — G6181 Chronic inflammatory demyelinating polyneuritis: Secondary | ICD-10-CM | POA: Diagnosis not present

## 2016-07-14 DIAGNOSIS — J309 Allergic rhinitis, unspecified: Secondary | ICD-10-CM | POA: Diagnosis not present

## 2016-07-14 DIAGNOSIS — J209 Acute bronchitis, unspecified: Secondary | ICD-10-CM | POA: Diagnosis not present

## 2016-07-14 DIAGNOSIS — J449 Chronic obstructive pulmonary disease, unspecified: Secondary | ICD-10-CM | POA: Diagnosis not present

## 2016-07-20 ENCOUNTER — Other Ambulatory Visit: Payer: Self-pay | Admitting: Dermatology

## 2016-07-20 DIAGNOSIS — L309 Dermatitis, unspecified: Secondary | ICD-10-CM | POA: Diagnosis not present

## 2016-07-20 DIAGNOSIS — D485 Neoplasm of uncertain behavior of skin: Secondary | ICD-10-CM | POA: Diagnosis not present

## 2016-07-20 DIAGNOSIS — L905 Scar conditions and fibrosis of skin: Secondary | ICD-10-CM | POA: Diagnosis not present

## 2016-07-21 DIAGNOSIS — G6181 Chronic inflammatory demyelinating polyneuritis: Secondary | ICD-10-CM | POA: Diagnosis not present

## 2016-07-21 DIAGNOSIS — I10 Essential (primary) hypertension: Secondary | ICD-10-CM | POA: Diagnosis not present

## 2016-07-21 DIAGNOSIS — R0602 Shortness of breath: Secondary | ICD-10-CM | POA: Diagnosis not present

## 2016-07-21 DIAGNOSIS — I2781 Cor pulmonale (chronic): Secondary | ICD-10-CM | POA: Diagnosis not present

## 2016-07-21 DIAGNOSIS — J9801 Acute bronchospasm: Secondary | ICD-10-CM | POA: Diagnosis not present

## 2016-07-24 DIAGNOSIS — R0602 Shortness of breath: Secondary | ICD-10-CM | POA: Diagnosis not present

## 2016-07-24 DIAGNOSIS — I2781 Cor pulmonale (chronic): Secondary | ICD-10-CM | POA: Diagnosis not present

## 2016-07-24 DIAGNOSIS — R0989 Other specified symptoms and signs involving the circulatory and respiratory systems: Secondary | ICD-10-CM | POA: Diagnosis not present

## 2016-07-24 DIAGNOSIS — M79606 Pain in leg, unspecified: Secondary | ICD-10-CM | POA: Diagnosis not present

## 2016-07-24 DIAGNOSIS — I10 Essential (primary) hypertension: Secondary | ICD-10-CM | POA: Diagnosis not present

## 2016-07-24 DIAGNOSIS — R000 Tachycardia, unspecified: Secondary | ICD-10-CM | POA: Diagnosis not present

## 2016-07-25 DIAGNOSIS — M341 CR(E)ST syndrome: Secondary | ICD-10-CM | POA: Diagnosis not present

## 2016-08-17 DIAGNOSIS — M4802 Spinal stenosis, cervical region: Secondary | ICD-10-CM | POA: Diagnosis not present

## 2016-08-17 DIAGNOSIS — R252 Cramp and spasm: Secondary | ICD-10-CM | POA: Diagnosis not present

## 2016-08-18 DIAGNOSIS — J309 Allergic rhinitis, unspecified: Secondary | ICD-10-CM | POA: Diagnosis not present

## 2016-08-18 DIAGNOSIS — J449 Chronic obstructive pulmonary disease, unspecified: Secondary | ICD-10-CM | POA: Diagnosis not present

## 2016-08-18 DIAGNOSIS — G6181 Chronic inflammatory demyelinating polyneuritis: Secondary | ICD-10-CM | POA: Diagnosis not present

## 2016-09-05 DIAGNOSIS — E782 Mixed hyperlipidemia: Secondary | ICD-10-CM | POA: Diagnosis not present

## 2016-09-05 DIAGNOSIS — F419 Anxiety disorder, unspecified: Secondary | ICD-10-CM | POA: Diagnosis not present

## 2016-09-05 DIAGNOSIS — G6181 Chronic inflammatory demyelinating polyneuritis: Secondary | ICD-10-CM | POA: Diagnosis not present

## 2016-09-05 DIAGNOSIS — E039 Hypothyroidism, unspecified: Secondary | ICD-10-CM | POA: Diagnosis not present

## 2016-09-05 DIAGNOSIS — M810 Age-related osteoporosis without current pathological fracture: Secondary | ICD-10-CM | POA: Diagnosis not present

## 2016-09-11 ENCOUNTER — Other Ambulatory Visit: Payer: Self-pay | Admitting: Dermatology

## 2016-09-11 DIAGNOSIS — C44311 Basal cell carcinoma of skin of nose: Secondary | ICD-10-CM | POA: Diagnosis not present

## 2016-09-11 DIAGNOSIS — L309 Dermatitis, unspecified: Secondary | ICD-10-CM | POA: Diagnosis not present

## 2016-09-13 DIAGNOSIS — R0602 Shortness of breath: Secondary | ICD-10-CM | POA: Diagnosis not present

## 2016-09-13 DIAGNOSIS — J45901 Unspecified asthma with (acute) exacerbation: Secondary | ICD-10-CM | POA: Diagnosis not present

## 2016-09-13 DIAGNOSIS — J441 Chronic obstructive pulmonary disease with (acute) exacerbation: Secondary | ICD-10-CM | POA: Diagnosis not present

## 2016-10-05 DIAGNOSIS — Z6825 Body mass index (BMI) 25.0-25.9, adult: Secondary | ICD-10-CM | POA: Diagnosis not present

## 2016-10-05 DIAGNOSIS — J301 Allergic rhinitis due to pollen: Secondary | ICD-10-CM | POA: Diagnosis not present

## 2016-10-05 DIAGNOSIS — J3489 Other specified disorders of nose and nasal sinuses: Secondary | ICD-10-CM | POA: Diagnosis not present

## 2016-10-16 DIAGNOSIS — H40013 Open angle with borderline findings, low risk, bilateral: Secondary | ICD-10-CM | POA: Diagnosis not present

## 2016-10-16 DIAGNOSIS — H524 Presbyopia: Secondary | ICD-10-CM | POA: Diagnosis not present

## 2016-10-16 DIAGNOSIS — H04123 Dry eye syndrome of bilateral lacrimal glands: Secondary | ICD-10-CM | POA: Diagnosis not present

## 2016-10-16 DIAGNOSIS — H02055 Trichiasis without entropian left lower eyelid: Secondary | ICD-10-CM | POA: Diagnosis not present

## 2016-10-16 DIAGNOSIS — H52223 Regular astigmatism, bilateral: Secondary | ICD-10-CM | POA: Diagnosis not present

## 2016-10-16 DIAGNOSIS — H10413 Chronic giant papillary conjunctivitis, bilateral: Secondary | ICD-10-CM | POA: Diagnosis not present

## 2016-10-30 DIAGNOSIS — L905 Scar conditions and fibrosis of skin: Secondary | ICD-10-CM | POA: Diagnosis not present

## 2016-10-30 DIAGNOSIS — Z85828 Personal history of other malignant neoplasm of skin: Secondary | ICD-10-CM | POA: Diagnosis not present

## 2016-11-17 DIAGNOSIS — J209 Acute bronchitis, unspecified: Secondary | ICD-10-CM | POA: Diagnosis not present

## 2016-11-17 DIAGNOSIS — J329 Chronic sinusitis, unspecified: Secondary | ICD-10-CM | POA: Diagnosis not present

## 2016-11-17 DIAGNOSIS — R0602 Shortness of breath: Secondary | ICD-10-CM | POA: Diagnosis not present

## 2016-11-17 DIAGNOSIS — J449 Chronic obstructive pulmonary disease, unspecified: Secondary | ICD-10-CM | POA: Diagnosis not present

## 2016-11-20 DIAGNOSIS — J45909 Unspecified asthma, uncomplicated: Secondary | ICD-10-CM | POA: Diagnosis not present

## 2016-11-20 DIAGNOSIS — J449 Chronic obstructive pulmonary disease, unspecified: Secondary | ICD-10-CM | POA: Diagnosis not present

## 2016-12-04 DIAGNOSIS — Z79899 Other long term (current) drug therapy: Secondary | ICD-10-CM | POA: Diagnosis not present

## 2016-12-04 DIAGNOSIS — F419 Anxiety disorder, unspecified: Secondary | ICD-10-CM | POA: Diagnosis not present

## 2016-12-04 DIAGNOSIS — I10 Essential (primary) hypertension: Secondary | ICD-10-CM | POA: Diagnosis not present

## 2016-12-08 ENCOUNTER — Other Ambulatory Visit: Payer: Self-pay | Admitting: Pulmonary Disease

## 2016-12-08 DIAGNOSIS — Z1231 Encounter for screening mammogram for malignant neoplasm of breast: Secondary | ICD-10-CM

## 2016-12-15 DIAGNOSIS — G6181 Chronic inflammatory demyelinating polyneuritis: Secondary | ICD-10-CM | POA: Diagnosis not present

## 2016-12-15 DIAGNOSIS — Z9181 History of falling: Secondary | ICD-10-CM | POA: Diagnosis not present

## 2016-12-15 DIAGNOSIS — G40901 Epilepsy, unspecified, not intractable, with status epilepticus: Secondary | ICD-10-CM | POA: Diagnosis not present

## 2016-12-15 DIAGNOSIS — M79644 Pain in right finger(s): Secondary | ICD-10-CM | POA: Diagnosis not present

## 2016-12-18 ENCOUNTER — Other Ambulatory Visit: Payer: Self-pay

## 2016-12-28 DIAGNOSIS — Z85828 Personal history of other malignant neoplasm of skin: Secondary | ICD-10-CM | POA: Diagnosis not present

## 2016-12-28 DIAGNOSIS — L820 Inflamed seborrheic keratosis: Secondary | ICD-10-CM | POA: Diagnosis not present

## 2016-12-28 DIAGNOSIS — L309 Dermatitis, unspecified: Secondary | ICD-10-CM | POA: Diagnosis not present

## 2017-01-02 DIAGNOSIS — N319 Neuromuscular dysfunction of bladder, unspecified: Secondary | ICD-10-CM | POA: Diagnosis not present

## 2017-01-09 DIAGNOSIS — N281 Cyst of kidney, acquired: Secondary | ICD-10-CM | POA: Diagnosis not present

## 2017-01-09 DIAGNOSIS — N202 Calculus of kidney with calculus of ureter: Secondary | ICD-10-CM | POA: Diagnosis not present

## 2017-01-09 DIAGNOSIS — N319 Neuromuscular dysfunction of bladder, unspecified: Secondary | ICD-10-CM | POA: Diagnosis not present

## 2017-01-18 DIAGNOSIS — M129 Arthropathy, unspecified: Secondary | ICD-10-CM | POA: Diagnosis not present

## 2017-01-18 DIAGNOSIS — Z0000 Encounter for general adult medical examination without abnormal findings: Secondary | ICD-10-CM | POA: Diagnosis not present

## 2017-01-18 DIAGNOSIS — Z113 Encounter for screening for infections with a predominantly sexual mode of transmission: Secondary | ICD-10-CM | POA: Diagnosis not present

## 2017-01-18 DIAGNOSIS — Z79899 Other long term (current) drug therapy: Secondary | ICD-10-CM | POA: Diagnosis not present

## 2017-01-18 DIAGNOSIS — E7800 Pure hypercholesterolemia, unspecified: Secondary | ICD-10-CM | POA: Diagnosis not present

## 2017-01-18 DIAGNOSIS — Z1389 Encounter for screening for other disorder: Secondary | ICD-10-CM | POA: Diagnosis not present

## 2017-01-18 DIAGNOSIS — D539 Nutritional anemia, unspecified: Secondary | ICD-10-CM | POA: Diagnosis not present

## 2017-01-18 DIAGNOSIS — E559 Vitamin D deficiency, unspecified: Secondary | ICD-10-CM | POA: Diagnosis not present

## 2017-01-18 DIAGNOSIS — R0602 Shortness of breath: Secondary | ICD-10-CM | POA: Diagnosis not present

## 2017-01-24 ENCOUNTER — Ambulatory Visit
Admission: RE | Admit: 2017-01-24 | Discharge: 2017-01-24 | Disposition: A | Discharge status: Home / Self Care | Payer: Medicare Other | Source: Ambulatory Visit | Attending: Pulmonary Disease | Admitting: Pulmonary Disease

## 2017-01-24 DIAGNOSIS — Z1231 Encounter for screening mammogram for malignant neoplasm of breast: Secondary | ICD-10-CM

## 2017-02-05 DIAGNOSIS — L821 Other seborrheic keratosis: Secondary | ICD-10-CM | POA: Diagnosis not present

## 2017-02-05 DIAGNOSIS — L309 Dermatitis, unspecified: Secondary | ICD-10-CM | POA: Diagnosis not present

## 2017-02-05 DIAGNOSIS — L570 Actinic keratosis: Secondary | ICD-10-CM | POA: Diagnosis not present

## 2017-02-05 DIAGNOSIS — L820 Inflamed seborrheic keratosis: Secondary | ICD-10-CM | POA: Diagnosis not present

## 2017-02-15 DIAGNOSIS — E7800 Pure hypercholesterolemia, unspecified: Secondary | ICD-10-CM | POA: Diagnosis not present

## 2017-02-15 DIAGNOSIS — F419 Anxiety disorder, unspecified: Secondary | ICD-10-CM | POA: Diagnosis not present

## 2017-02-15 DIAGNOSIS — E559 Vitamin D deficiency, unspecified: Secondary | ICD-10-CM | POA: Diagnosis not present

## 2017-02-15 DIAGNOSIS — I10 Essential (primary) hypertension: Secondary | ICD-10-CM | POA: Diagnosis not present

## 2017-02-15 DIAGNOSIS — E041 Nontoxic single thyroid nodule: Secondary | ICD-10-CM | POA: Diagnosis not present

## 2017-02-15 DIAGNOSIS — Z79899 Other long term (current) drug therapy: Secondary | ICD-10-CM | POA: Diagnosis not present

## 2017-02-20 DIAGNOSIS — R0602 Shortness of breath: Secondary | ICD-10-CM | POA: Diagnosis not present

## 2017-02-20 DIAGNOSIS — Z23 Encounter for immunization: Secondary | ICD-10-CM | POA: Diagnosis not present

## 2017-02-20 DIAGNOSIS — J449 Chronic obstructive pulmonary disease, unspecified: Secondary | ICD-10-CM | POA: Diagnosis not present

## 2017-03-16 DIAGNOSIS — Z79899 Other long term (current) drug therapy: Secondary | ICD-10-CM | POA: Diagnosis not present

## 2017-03-16 DIAGNOSIS — R5382 Chronic fatigue, unspecified: Secondary | ICD-10-CM | POA: Diagnosis not present

## 2017-03-16 DIAGNOSIS — J449 Chronic obstructive pulmonary disease, unspecified: Secondary | ICD-10-CM | POA: Diagnosis not present

## 2017-03-16 DIAGNOSIS — I10 Essential (primary) hypertension: Secondary | ICD-10-CM | POA: Diagnosis not present

## 2017-03-16 DIAGNOSIS — E559 Vitamin D deficiency, unspecified: Secondary | ICD-10-CM | POA: Diagnosis not present

## 2017-04-05 DIAGNOSIS — L308 Other specified dermatitis: Secondary | ICD-10-CM | POA: Diagnosis not present

## 2017-04-13 DIAGNOSIS — J449 Chronic obstructive pulmonary disease, unspecified: Secondary | ICD-10-CM | POA: Diagnosis not present

## 2017-04-13 DIAGNOSIS — Z79899 Other long term (current) drug therapy: Secondary | ICD-10-CM | POA: Diagnosis not present

## 2017-04-13 DIAGNOSIS — R5382 Chronic fatigue, unspecified: Secondary | ICD-10-CM | POA: Diagnosis not present

## 2017-04-13 DIAGNOSIS — I10 Essential (primary) hypertension: Secondary | ICD-10-CM | POA: Diagnosis not present

## 2017-04-13 DIAGNOSIS — E559 Vitamin D deficiency, unspecified: Secondary | ICD-10-CM | POA: Diagnosis not present

## 2017-05-03 DIAGNOSIS — R252 Cramp and spasm: Secondary | ICD-10-CM | POA: Diagnosis not present

## 2017-05-03 DIAGNOSIS — M4802 Spinal stenosis, cervical region: Secondary | ICD-10-CM | POA: Diagnosis not present

## 2017-05-11 DIAGNOSIS — J449 Chronic obstructive pulmonary disease, unspecified: Secondary | ICD-10-CM | POA: Diagnosis not present

## 2017-05-11 DIAGNOSIS — I10 Essential (primary) hypertension: Secondary | ICD-10-CM | POA: Diagnosis not present

## 2017-05-11 DIAGNOSIS — Z79899 Other long term (current) drug therapy: Secondary | ICD-10-CM | POA: Diagnosis not present

## 2017-05-11 DIAGNOSIS — E559 Vitamin D deficiency, unspecified: Secondary | ICD-10-CM | POA: Diagnosis not present

## 2017-05-11 DIAGNOSIS — R5382 Chronic fatigue, unspecified: Secondary | ICD-10-CM | POA: Diagnosis not present

## 2017-05-15 DIAGNOSIS — J449 Chronic obstructive pulmonary disease, unspecified: Secondary | ICD-10-CM | POA: Diagnosis not present

## 2017-05-15 DIAGNOSIS — R0602 Shortness of breath: Secondary | ICD-10-CM | POA: Diagnosis not present

## 2017-05-15 DIAGNOSIS — I10 Essential (primary) hypertension: Secondary | ICD-10-CM | POA: Diagnosis not present

## 2017-05-15 DIAGNOSIS — R05 Cough: Secondary | ICD-10-CM | POA: Diagnosis not present

## 2017-07-11 ENCOUNTER — Other Ambulatory Visit: Payer: Self-pay | Admitting: Internal Medicine

## 2017-12-03 ENCOUNTER — Ambulatory Visit: Payer: Medicare Other | Attending: Rehabilitation | Admitting: Physical Therapy

## 2017-12-03 ENCOUNTER — Other Ambulatory Visit: Payer: Self-pay

## 2017-12-03 DIAGNOSIS — R2681 Unsteadiness on feet: Secondary | ICD-10-CM | POA: Diagnosis present

## 2017-12-03 DIAGNOSIS — R2689 Other abnormalities of gait and mobility: Secondary | ICD-10-CM | POA: Insufficient documentation

## 2017-12-03 DIAGNOSIS — M6249 Contracture of muscle, multiple sites: Secondary | ICD-10-CM | POA: Insufficient documentation

## 2017-12-03 DIAGNOSIS — R293 Abnormal posture: Secondary | ICD-10-CM

## 2017-12-03 DIAGNOSIS — M542 Cervicalgia: Secondary | ICD-10-CM | POA: Diagnosis not present

## 2017-12-03 DIAGNOSIS — R296 Repeated falls: Secondary | ICD-10-CM | POA: Diagnosis present

## 2017-12-03 DIAGNOSIS — M6281 Muscle weakness (generalized): Secondary | ICD-10-CM | POA: Insufficient documentation

## 2017-12-04 NOTE — Therapy (Signed)
[facility] [address] [place], [place], [address] Phone: [phone]   Fax:  [phone]  Physical Therapy Evaluation  Patient Details  Name: [name] MRN: [id number] Date of Birth: 12-06-1932 Referring Provider: Cira Servant, DO   Encounter Date: 12/03/2017  PT End of Session - 12/03/17 1202    Visit Number  1    Number of Visits  26    Date for PT Re-Evaluation  03/01/18    Authorization Type  Medicare & BCBS supplement, no VL, 100% covered    PT Start Time  1100    PT Stop Time  1148    PT Time Calculation (min)  48 min    Equipment Utilized During Treatment  Gait belt    Activity Tolerance  Patient tolerated treatment well    Behavior During Therapy  WFL for tasks assessed/performed       Past Medical History:  Diagnosis Date  . Allergic rhinitis   . Anemia   . Arthritis   . Asthma -COPD   . Blood transfusion 1957  . Breast mass    right breast in milk duct, bx neg  . Chronic fatigue syndrome   . Chronic inflammatory demyelinating polyneuropathy ([place]) 03/2011  . chronic sinusitis 08/08/2012  . Colon polyp   . Constipation   . COPD (chronic obstructive pulmonary disease) ([place])   . Cystocele 09/16/2012  . GERD (gastroesophageal reflux disease)   . Hyperlipidemia   . Hypertension   . Hypothyroid   . Neurogenic bladder 2013   husband caths pt, manages with timed void  . Osteoporosis   . Pulmonary embolism ([place])    1980s  . Renal cyst    Non-complex, contrast MRI 2013 with L>R non-complex cysts, dominant 3.7 left lower pole  . Seizures ([place])    1990 last seizures on meds Phenobarb  . Shingles late 17's  . Skin cancer    squamous cell  . Thyroid nodule    Right thyroid lobe, only seen on Sagittal imaging measures 2.3 cm in craniocaudal dimension and appears stable  . Tubular adenoma   . Vitamin D deficiency     Past Surgical History:  Procedure Laterality Date  . ABDOMINAL  HYSTERECTOMY    . ANTERIOR FUSION CLIVUS-C2 EXTRAORAL W/ ODONTOID EXCISION  8/14  . APPENDECTOMY    . BASAL CELL CARCINOMA EXCISION     face  . BLADDER SUSPENSION    . BREAST DUCTAL SYSTEM EXCISION Right 01/15/2014   Procedure: EXCISION DUCTAL SYSTEM RIGHT BREAST;  Surgeon: Stark Klein, MD;  Location: [facility];  Service: General;  Laterality: Right;  . BREAST EXCISIONAL BIOPSY    . CARPAL TUNNEL RELEASE     right  . CATARACT EXTRACTION     bilateral  . cervical neck ablation     x 7, C3-C6/3 screws and plate  . CESAREAN SECTION    . CYSTOCELE REPAIR    . DILATION AND CURETTAGE OF UTERUS    . duptyren's contracture right hand    . FINGER ARTHRODESIS Right 05/06/2015   Procedure: RIGHT RING PROXIMAL INTERPHALANGEAL FUSION (PIP);  Surgeon: Leanora Cover, MD;  Location: [facility];  Service: Orthopedics;  Laterality: Right;  . FINGER GANGLION CYST EXCISION     right  . JOINT REPLACEMENT     right and left basal joints of thumbs  . left finger fusion     3 fingers on left hand/one finger right  . left  ovary and tube removed    . NASAL POLYP SURGERY     4 sinus surgeries  . NASAL SEPTUM SURGERY    . PANNICULECTOMY    . PROXIMAL INTERPHALANGEAL FUSION (PIP) Left 09/09/2013   Procedure: FUSION LEFT INDEX PROXIMAL INTERPHALANGEAL JOINT (PIP);  Surgeon: Cammie Sickle., MD;  Location[facility];  Service: Orthopedics;  Laterality: Left;  . right median nerve decompression    . SHOULDER ARTHROSCOPY     x2 left, 1 right  . sinus surgeries     x 4  . squamous lesions removed     neck and face  . STERIOD INJECTION Right 05/06/2015   Procedure: STEROID INJECTION;  Surgeon: Leanora Cover, MD;  Location: [facility];  Service: Orthopedics;  Laterality: Right;  Right Index Finger Proximal InterPhalangeal Joint Injection  . TONSILLECTOMY AND ADENOIDECTOMY    . TUBAL LIGATION    . vocal polyps removed      There were no vitals  filed for this visit.   Subjective Assessment - 12/03/17 1100    Subjective  This 84yo female was referred on 11/27/2017 by Cira Servant, DO for gait disorder & cervical spine pain. She had 11 PT visits 11/03/14-12/28/14.  She reports atleast 3 falls in last month.    Pertinent History  OA, asthma, COPD, HTN, neuropathy, ant cervical fusion, breast CA, arthroscopic left knee sg, 3 frozen shoulders, Right basal joint replacement Rt hand, left middle finger fusion, carpal tunnel syndrome,     Patient Stated Goals  to improve gait so does not fall.     Currently in Pain?  Yes    Pain Score  5  In last week, worst 9/10, best 4/10    Pain Location  Neck    Pain Orientation  Left;Posterior;Mid    Pain Descriptors / Indicators  Discomfort    Pain Type  Chronic pain    Pain Onset  More than a month ago    Pain Frequency  Constant    Aggravating Factors   no pain patch, moving neck especially when stiff    Pain Relieving Factors  pain patch,          [facility] PT Assessment - 12/03/17 1100      Assessment   Medical Diagnosis  Gait disorder, neck pain    Referring Provider  Cira Servant, DO    Onset Date/Surgical Date  11/27/17 PT referral     Hand Dominance  Right    Prior Therapy  11 PT visits 11/03/14-12/28/14      Precautions   Precautions  Fall      Balance Screen   Has the patient fallen in the past 6 months  Yes    How many times?  >5 times    Has the patient had a decrease in activity level because of a fear of falling?   Yes    Is the patient reluctant to leave their home because of a fear of falling?   No      Home Environment   Living Environment  Private residence    Living Arrangements  Spouse/significant other no pets    Type of [place] moving single level townhouse level entry    [place] to enter    Entrance Stairs-Number of Steps  2    [place]  One level    [place] - 4 wheels;[place]ane -  single point;Toilet riser       Prior Function   Level of Independence  Independent    Vocation  Retired    Leisure  read,       Haematologist  Postural limitations    Postural Limitations  Rounded Shoulders;Forward head;Decreased lumbar lordosis;Flexed trunk      ROM / Strength   AROM / PROM / Strength  AROM;Strength      AROM   Overall AROM   Deficits    AROM Assessment Site  Cervical    Cervical Flexion  23    Cervical Extension  20    Cervical - Right Side Bend  8    Cervical - Left Side Bend  21    Cervical - Right Rotation  35    Cervical - Left Rotation  12      Strength   Overall Strength  Deficits    Strength Assessment Site  Hip;Knee;Ankle    Right Hip Flexion  3-/5    Right Hip Extension  3-/5 tested in standing    Right Hip ABduction  3-/5 tested in standing    Left Hip Flexion  3-/5    Left Hip Extension  3-/5 tested in standing    Left Hip ABduction  3-/5 tested in standing    Right/Left Knee  Right;Left    Right Knee Flexion  3-/5 tested in standing    Right Knee Extension  3-/5    Left Knee Flexion  3-/5 tested in standing    Left Knee Extension  3-/5    Right Ankle Dorsiflexion  4/5    Right Ankle Plantar Flexion  2+/5    Left Ankle Dorsiflexion  4/5    Left Ankle Plantar Flexion  2+/5      Transfers   Transfers  Sit to Stand;Stand to Sit    Sit to Stand  5: Supervision;With upper extremity assist;With armrests;From chair/3-in-1 requires touch on walker for stabilization    Stand to Sit  5: Supervision;With upper extremity assist;With armrests;To chair/3-in-1 walker for stability      Ambulation/Gait   Ambulation/Gait  Yes    Ambulation/Gait Assistance  5: Supervision    Ambulation/Gait Assistance Details  Unsafe rollator walker support: hands not on brakes, walker too anterior facilitating flexed trunk and does not lock brakes with sit to/from stand.     Ambulation Distance (Feet)  150 Feet    Assistive device  Rollator    Gait Pattern   Step-through pattern;Decreased step length - right;Decreased step length - left;Decreased stride length;Trunk flexed    Ambulation Surface  Indoor;Level    Gait velocity  1.54 ft/sec  <1.8 ft/sec indicates high fall risk    Gait Comments  Vital signs:  rest- HR 86 SpO2 93%, after gait & balance assessment  HR 87 SpO2 97%      Standardized Balance Assessment   Standardized Balance Assessment  Berg Balance Test;Timed Up and Go Test      Berg Balance Test   Sit to Stand  Needs minimal aid to stand or to stabilize    Standing Unsupported  Needs several tries to stand 30 seconds unsupported    Sitting with Back Unsupported but Feet Supported on Floor or Stool  Able to sit safely and securely 2 minutes    Stand to Sit  Uses backs of legs against chair to control descent    Transfers  Able to transfer safely, definite need of hands  Standing Unsupported with Eyes Closed  Needs help to keep from falling    Standing Ubsupported with Feet Together  Needs help to attain position and unable to hold for 15 seconds    From Standing, Reach Forward with Outstretched Arm  Reaches forward but needs supervision    From Standing Position, Pick up Object from Floor  Unable to try/needs assist to keep balance    From Standing Position, Turn to Look Behind Over each Shoulder  Needs assist to keep from losing balance and falling    Turn 360 Degrees  Needs assistance while turning    Standing Unsupported, Alternately Place Feet on Step/Stool  Needs assistance to keep from falling or unable to try    Standing Unsupported, One Foot in [employer] balance while stepping or standing    Standing on One Leg  Unable to try or needs assist to prevent fall    Total Score  12      Timed Up and Go Test   Normal TUG (seconds)  18.93 rollator walker                Objective measurements completed on examination: See above findings.              PT Education - 12/03/17 1146    Education Details  PT  plan of care, rollator walker locking brakes sit to/from stand and hand position on brakes with gait,  ankle, hip & step strategies    Person(s) Educated  Patient    Methods  Explanation;Verbal cues;Demonstration    Comprehension  Verbal cues required;Need further instruction;Verbalized understanding       PT Short Term Goals - 12/03/17 1838      PT SHORT TERM GOAL #1   Title  Patient & husband demonstrate understanding of initial HEP. (All STGs Target Date: 01/03/2018)    Time  1    Period  Months    Status  New    Target Date  01/03/18      PT SHORT TERM GOAL #2   Title  Patient demonstrates safe rollator walker use including moving light items to/from seat with verbal cues only.     Time  1    Period  Months    Status  New    Target Date  01/03/18      PT SHORT TERM GOAL #3   Title  Patient negotiates ramps & curbs with rollator walker with modA.     Time  1    Period  Months    Status  New    Target Date  01/03/18        PT Long Term Goals - 12/03/17 1830      PT LONG TERM GOAL #1   Title  Patient & husband verbalize & demonstrate understanding of ongoing HEP. (All LTGs Target Date: 03/01/2018)    Time  3    Period  Months    Status  New    Target Date  03/01/18      PT LONG TERM GOAL #2   Title  Patient & husband verbalize community fitness options including equipment.     Time  3    Period  Months    Status  New    Target Date  03/01/18      PT LONG TERM GOAL #3   Title  Patient ambulates 300' with rollator walker with supervision with safe rollator practices.     Time  3  Period  Months    Status  New    Target Date  03/01/18      PT LONG TERM GOAL #4   Title  Patient negotiates around household furniture, uses rollator walker to transport items like plate and sit to/from stand with rollator walker safely modified independent.     Time  3    Period  Months    Status  New    Target Date  03/01/18      PT LONG TERM GOAL #5   Title  Patient  negotiates ramps & curbs with rollator walker with husband assist (minA) safely.     Time  3    Period  Months    Status  New    Target Date  03/01/18      PT LONG TERM GOAL #6   Title  Timed Up & Go with rollator walker <13.5 seconds to indicate lower fall risk.     Time  3    Period  Months    Status  New    Target Date  03/01/18      PT LONG TERM GOAL #7   Title  Gait velocity with rollator walker >1.8 ft/sec to indicate lower fall risk.     Time  3    Period  Months    Status  New    Target Date  03/01/18      PT LONG TERM GOAL #8   Title  Patient reports neck pain increases <2 increments on 0-10 scale with standing & gait activities.     Time  3    Period  Months    Status  New    Target Date  03/01/18             Plan - 12/03/17 1817    Clinical Impression Statement  This 84yo female has experienced increased incidents of falls over last few months. She denies syncope, dizziness or tripping. Most falls are backward per report.  Patient has neck pain managed by pain patch. She has decreased neck ROM but history of cervical fusion.  She has weakness of bilateral lower extremities. She has high fall risk & dependency in standing ADLs with Merrilee Jansky Balance 12/56 and Timed Up-Go 18.93 sec with rollator walker. Patient ambulates unsafely with rollator walker at 1.54 ft/sec indicating high fall risk. Patient would benefit from skilled PT to improve mobility & safety.     History and Personal Factors relevant to plan of care:  lives with husband who provides care, moving to new home in next few months    Clinical Presentation  Evolving    Clinical Presentation due to:  high fall risk, multiple fall history, COPD, multiple medical issues.    Clinical Decision Making  Moderate    Rehab Potential  Good    Clinical Impairments Affecting Rehab Potential  unsafe rollator walker use    PT Frequency  2x / week    PT Duration  Other (comment) 90 days (13 weeks)    PT  Treatment/Interventions  ADLs/Self Care Home Management;DME Instruction;Gait training;Stair training;Functional mobility training;Therapeutic activities;Therapeutic exercise;Balance training;Neuromuscular re-education;Cognitive remediation;Patient/family education;Passive range of motion;Vestibular    PT Next Visit Plan  instruct in safe rollator walker use including ramps & curbs,  initiate HEP    Consulted and Agree with Plan of Care  Patient;Family member/caregiver    Family Member Consulted  husband, Thessaly Mccullers       Patient will benefit from skilled therapeutic intervention in  order to improve the following deficits and impairments:  Abnormal gait, Decreased activity tolerance, Decreased balance, Decreased endurance, Decreased knowledge of use of DME, Decreased mobility, Decreased range of motion, Decreased strength, Impaired flexibility, Postural dysfunction, Pain  Visit Diagnosis: Cervicalgia  Abnormal posture  Muscle weakness (generalized)  Contracture of muscle, multiple sites  Repeated falls  Unsteadiness on feet  Other abnormalities of gait and mobility     Problem List Patient Active Problem List   Diagnosis Date Noted  . PCP NOTES >>>>>>>>>>>>>>>>>>> 09/16/2015  . Multiple thyroid nodules 03/30/2015  . Annual physical exam 08/07/2014  . Bloody discharge from nipple - s/p surgery, resolved  12/29/2013  . Breast mass, right 12/11/2013  . At high risk for falls 12/11/2013  . Renal cysts, acquired, bilateral--per urology 05/04/2013  . Elevated LFTs 04/01/2013  . Cervical spinal stenosis 11/21/2012  . Nondiabetic gastroparesis 08/20/2012  . Hypothyroidism 08/08/2012  . Gastroparesis 05/06/2012  . Muscle cramp 01/19/2012  . Fatigue 01/14/2012  . Neurogenic bladder, prolapse ,h/o urinary retention, self cath, sees urology 11/05/2011  . Fecal incontinence 10/03/2011  . Osteoporosis 07/24/2011  . Adenoma of cecum 07/17/2011  . Chronic constipation 07/17/2011  .  Skin lesion 07/08/2011  . Chronic neck pain 04/16/2011  . Rectocele 04/16/2011  . Hypersomnia-- on dextroamphetamine 04/16/2011  . Vitamin D deficiency 04/16/2011  . Family history of colon cancer 04/16/2011  . Abnormal blood chemistry 04/16/2011  . Anemia   . COPD (chronic obstructive pulmonary disease) gold stage B   . Depression   . GERD (gastroesophageal reflux disease)   . Hyperlipidemia   . Hypertension   . Allergic rhinitis   . Pulmonary embolism-- in the 35s   . Skin cancer   . Seizures ([place])   . CIDP (chronic inflammatory demyelinating polyneuropathy) , Ataxia, Imbalance -- f/u at [facility], DPT 12/04/2017, 5:42 AM  [facility] [address] [place], [place], [address] Phone: [phone]   Fax:  [phone]  Name: [name] MRN: [id number] Date of Birth: Nov 02, 1932

## 2017-12-12 ENCOUNTER — Ambulatory Visit: Payer: Medicare Other | Admitting: Physical Therapy

## 2017-12-12 ENCOUNTER — Encounter: Payer: Self-pay | Admitting: Physical Therapy

## 2017-12-12 DIAGNOSIS — M542 Cervicalgia: Secondary | ICD-10-CM | POA: Diagnosis not present

## 2017-12-12 DIAGNOSIS — R2681 Unsteadiness on feet: Secondary | ICD-10-CM

## 2017-12-12 DIAGNOSIS — R293 Abnormal posture: Secondary | ICD-10-CM

## 2017-12-12 DIAGNOSIS — R2689 Other abnormalities of gait and mobility: Secondary | ICD-10-CM

## 2017-12-12 DIAGNOSIS — M6281 Muscle weakness (generalized): Secondary | ICD-10-CM

## 2017-12-13 NOTE — Therapy (Signed)
[facility] [address] [place], [place], [address] Phone: [phone]   Fax:  [phone]  Physical Therapy Treatment  Patient Details  Name: [name] MRN: [id number] Date of Birth: 31-Oct-1932 Referring Provider: Cira Servant, DO   Encounter Date: 12/12/2017  PT End of Session - 12/12/17 1855    Visit Number  2    Number of Visits  26    Date for PT Re-Evaluation  03/01/18    Authorization Type  Medicare & BCBS supplement, no VL, 100% covered    PT Start Time  1018    PT Stop Time  1100    PT Time Calculation (min)  42 min    Equipment Utilized During Treatment  Gait belt    Activity Tolerance  Patient tolerated treatment well    Behavior During Therapy  WFL for tasks assessed/performed       Past Medical History:  Diagnosis Date  . Allergic rhinitis   . Anemia   . Arthritis   . Asthma -COPD   . Blood transfusion 1957  . Breast mass    right breast in milk duct, bx neg  . Chronic fatigue syndrome   . Chronic inflammatory demyelinating polyneuropathy ([place]) 03/2011  . chronic sinusitis 08/08/2012  . Colon polyp   . Constipation   . COPD (chronic obstructive pulmonary disease) ([place])   . Cystocele 09/16/2012  . GERD (gastroesophageal reflux disease)   . Hyperlipidemia   . Hypertension   . Hypothyroid   . Neurogenic bladder 2013   husband caths pt, manages with timed void  . Osteoporosis   . Pulmonary embolism ([place])    1980s  . Renal cyst    Non-complex, contrast MRI 2013 with L>R non-complex cysts, dominant 3.7 left lower pole  . Seizures ([place])    1990 last seizures on meds Phenobarb  . Shingles late 103's  . Skin cancer    squamous cell  . Thyroid nodule    Right thyroid lobe, only seen on Sagittal imaging measures 2.3 cm in craniocaudal dimension and appears stable  . Tubular adenoma   . Vitamin D deficiency     Past Surgical History:  Procedure Laterality Date  . ABDOMINAL  HYSTERECTOMY    . ANTERIOR FUSION CLIVUS-C2 EXTRAORAL W/ ODONTOID EXCISION  8/14  . APPENDECTOMY    . BASAL CELL CARCINOMA EXCISION     face  . BLADDER SUSPENSION    . BREAST DUCTAL SYSTEM EXCISION Right 01/15/2014   Procedure: EXCISION DUCTAL SYSTEM RIGHT BREAST;  Surgeon: Stark Klein, MD;  Location: [facility];  Service: General;  Laterality: Right;  . BREAST EXCISIONAL BIOPSY    . CARPAL TUNNEL RELEASE     right  . CATARACT EXTRACTION     bilateral  . cervical neck ablation     x 7, C3-C6/3 screws and plate  . CESAREAN SECTION    . CYSTOCELE REPAIR    . DILATION AND CURETTAGE OF UTERUS    . duptyren's contracture right hand    . FINGER ARTHRODESIS Right 05/06/2015   Procedure: RIGHT RING PROXIMAL INTERPHALANGEAL FUSION (PIP);  Surgeon: Leanora Cover, MD;  Location: [facility];  Service: Orthopedics;  Laterality: Right;  . FINGER GANGLION CYST EXCISION     right  . JOINT REPLACEMENT     right and left basal joints of thumbs  . left finger fusion     3 fingers on left hand/one finger right  . left  ovary and tube removed    . NASAL POLYP SURGERY     4 sinus surgeries  . NASAL SEPTUM SURGERY    . PANNICULECTOMY    . PROXIMAL INTERPHALANGEAL FUSION (PIP) Left 09/09/2013   Procedure: FUSION LEFT INDEX PROXIMAL INTERPHALANGEAL JOINT (PIP);  Surgeon: Cammie Sickle., MD;  Location: [facility];  Service: Orthopedics;  Laterality: Left;  . right median nerve decompression    . SHOULDER ARTHROSCOPY     x2 left, 1 right  . sinus surgeries     x 4  . squamous lesions removed     neck and face  . STERIOD INJECTION Right 05/06/2015   Procedure: STEROID INJECTION;  Surgeon: Leanora Cover, MD;  Location: [facility];  Service: Orthopedics;  Laterality: Right;  Right Index Finger Proximal InterPhalangeal Joint Injection  . TONSILLECTOMY AND ADENOIDECTOMY    . TUBAL LIGATION    . vocal polyps removed      There were no vitals  filed for this visit.  Subjective Assessment - 12/12/17 1015    Subjective  No falls. They closed on new home but are not going to move until September to remodel the bathroom.     Pertinent History  OA, asthma, COPD, HTN, neuropathy, ant cervical fusion, breast CA, arthroscopic left knee sg, 3 frozen shoulders, Right basal joint replacement Rt hand, left middle finger fusion, carpal tunnel syndrome,     Patient Stated Goals  to improve gait so does not fall.     Currently in Pain?  Yes    Pain Score  1  prior to patch 8/10    Pain Location  Neck    Pain Orientation  Left;Posterior;Mid    Pain Descriptors / Indicators  Discomfort    Pain Type  Chronic pain    Pain Onset  More than a month ago    Pain Frequency  Constant    Aggravating Factors   no pain patch, moving neck especially when stiff    Pain Relieving Factors  pain patch       Gait Training: Pt's husband observing with PT instructing him in cues to facilitate carryover & improve memory / behavior changes. PT demo & instructed in rollator walker safety including brake management, position within rollator & sit to/from stand on seat. Pt ambulated around furniture 150' X 3 sitting on chairs with armrests & without armrests and rollator seat with verbal cues. PT instructed in sit to/from stand technique with weight shift. PT instructed with demo in rollator walker use on ramps & curbs. Pt return demo with minA & verbal/tactile cues 2 reps each.  PT demo rollator use in kitchen with moving items to/from refrigerator to countertop and moving plate from counter to table. Pt & husband verbalized understanding.                            PT Short Term Goals - 12/03/17 1838      PT SHORT TERM GOAL #1   Title  Patient & husband demonstrate understanding of initial HEP. (All STGs Target Date: 01/03/2018)    Time  1    Period  Months    Status  New    Target Date  01/03/18      PT SHORT TERM GOAL #2   Title   Patient demonstrates safe rollator walker use including moving light items to/from seat with verbal cues only.  Time  1    Period  Months    Status  New    Target Date  01/03/18      PT SHORT TERM GOAL #3   Title  Patient negotiates ramps & curbs with rollator walker with modA.     Time  1    Period  Months    Status  New    Target Date  01/03/18        PT Long Term Goals - 12/03/17 1830      PT LONG TERM GOAL #1   Title  Patient & husband verbalize & demonstrate understanding of ongoing HEP. (All LTGs Target Date: 03/01/2018)    Time  3    Period  Months    Status  New    Target Date  03/01/18      PT LONG TERM GOAL #2   Title  Patient & husband verbalize community fitness options including equipment.     Time  3    Period  Months    Status  New    Target Date  03/01/18      PT LONG TERM GOAL #3   Title  Patient ambulates 300' with rollator walker with supervision with safe rollator practices.     Time  3    Period  Months    Status  New    Target Date  03/01/18      PT LONG TERM GOAL #4   Title  Patient negotiates around household furniture, uses rollator walker to transport items like plate and sit to/from stand with rollator walker safely modified independent.     Time  3    Period  Months    Status  New    Target Date  03/01/18      PT LONG TERM GOAL #5   Title  Patient negotiates ramps & curbs with rollator walker with husband assist (minA) safely.     Time  3    Period  Months    Status  New    Target Date  03/01/18      PT LONG TERM GOAL #6   Title  Timed Up & Go with rollator walker <13.5 seconds to indicate lower fall risk.     Time  3    Period  Months    Status  New    Target Date  03/01/18      PT LONG TERM GOAL #7   Title  Gait velocity with rollator walker >1.8 ft/sec to indicate lower fall risk.     Time  3    Period  Months    Status  New    Target Date  03/01/18      PT LONG TERM GOAL #8   Title  Patient reports neck pain  increases <2 increments on 0-10 scale with standing & gait activities.     Time  3    Period  Months    Status  New    Target Date  03/01/18            Plan - 12/12/17 1800    Clinical Impression Statement  Patient and her husband appear to have better understanding of safe rollator walker use including ramps, curbs & kitchen use. Due to cognitive limitations of patient she will need reminders. Her husband verbalized understanding of consistency of cues to facilitate better carryover.     Rehab Potential  Good    Clinical Impairments Affecting Rehab Potential  unsafe rollator walker use    PT Frequency  2x / week    PT Duration  Other (comment) 90 days (13 weeks)    PT Treatment/Interventions  ADLs/Self Care Home Management;DME Instruction;Gait training;Stair training;Functional mobility training;Therapeutic activities;Therapeutic exercise;Balance training;Neuromuscular re-education;Cognitive remediation;Patient/family education;Passive range of motion;Vestibular    PT Next Visit Plan  review safe rollator walker use including ramps & curbs,  initiate HEP    Consulted and Agree with Plan of Care  Patient;Family member/caregiver    Family Member Consulted  husband, Krishana Lutze       Patient will benefit from skilled therapeutic intervention in order to improve the following deficits and impairments:  Abnormal gait, Decreased activity tolerance, Decreased balance, Decreased endurance, Decreased knowledge of use of DME, Decreased mobility, Decreased range of motion, Decreased strength, Impaired flexibility, Postural dysfunction, Pain  Visit Diagnosis: Cervicalgia  Abnormal posture  Muscle weakness (generalized)  Unsteadiness on feet  Other abnormalities of gait and mobility     Problem List Patient Active Problem List   Diagnosis Date Noted  . PCP NOTES >>>>>>>>>>>>>>>>>>> 09/16/2015  . Multiple thyroid nodules 03/30/2015  . Annual physical exam 08/07/2014  . Bloody  discharge from nipple - s/p surgery, resolved  12/29/2013  . Breast mass, right 12/11/2013  . At high risk for falls 12/11/2013  . Renal cysts, acquired, bilateral--per urology 05/04/2013  . Elevated LFTs 04/01/2013  . Cervical spinal stenosis 11/21/2012  . Nondiabetic gastroparesis 08/20/2012  . Hypothyroidism 08/08/2012  . Gastroparesis 05/06/2012  . Muscle cramp 01/19/2012  . Fatigue 01/14/2012  . Neurogenic bladder, prolapse ,h/o urinary retention, self cath, sees urology 11/05/2011  . Fecal incontinence 10/03/2011  . Osteoporosis 07/24/2011  . Adenoma of cecum 07/17/2011  . Chronic constipation 07/17/2011  . Skin lesion 07/08/2011  . Chronic neck pain 04/16/2011  . Rectocele 04/16/2011  . Hypersomnia-- on dextroamphetamine 04/16/2011  . Vitamin D deficiency 04/16/2011  . Family history of colon cancer 04/16/2011  . Abnormal blood chemistry 04/16/2011  . Anemia   . COPD (chronic obstructive pulmonary disease) gold stage B   . Depression   . GERD (gastroesophageal reflux disease)   . Hyperlipidemia   . Hypertension   . Allergic rhinitis   . Pulmonary embolism-- in the 68s   . Skin cancer   . Seizures ([place])   . CIDP (chronic inflammatory demyelinating polyneuropathy) , Ataxia, Imbalance -- f/u at [facility] PT,DPT 12/13/2017, 7:09 AM  [facility] [address] [place], [place], [address] Phone: [phone]   Fax:  [phone]  Name: SEJLA MARZANO MRN: [id number] Date of Birth: 06-07-1932

## 2017-12-19 ENCOUNTER — Ambulatory Visit: Payer: Medicare Other | Admitting: Physical Therapy

## 2017-12-19 ENCOUNTER — Encounter: Payer: Self-pay | Admitting: Physical Therapy

## 2017-12-19 DIAGNOSIS — M542 Cervicalgia: Secondary | ICD-10-CM

## 2017-12-19 DIAGNOSIS — R2681 Unsteadiness on feet: Secondary | ICD-10-CM

## 2017-12-19 DIAGNOSIS — R293 Abnormal posture: Secondary | ICD-10-CM

## 2017-12-19 DIAGNOSIS — R2689 Other abnormalities of gait and mobility: Secondary | ICD-10-CM

## 2017-12-19 DIAGNOSIS — M6281 Muscle weakness (generalized): Secondary | ICD-10-CM

## 2017-12-19 NOTE — Patient Instructions (Signed)
Access Code: [email or web address]  URL: [email or web address]  Date: 12/19/2017  Prepared by: Halina Andreas   Exercises  Sit to Stand - 10 reps - 1 sets - 1x daily - 5x weekly  Standing Heel Raise - 10 reps - 1 sets - 1x daily - 5x weekly  Side Stepping with Counter Support - 10 reps - 1 sets - 1x daily - 5x weekly  Standing Marching - 10 reps - 1 sets - 1x daily - 5x weekly  Standing Balance in Corner - 2 sets - 30 hold - 1x daily - 5x weekly  Wide Stance with Head Nods on Foam Pad - 10 reps - 3 sets - 1x daily - 7x weekly  Wide Stance with Head Rotation on Foam Pad - 10 reps - 3 sets - 1x daily - 7x weekly

## 2017-12-19 NOTE — Therapy (Signed)
[facility] [address] [place], [place], [address] Phone: [phone]   Fax:  [phone]  Physical Therapy Treatment  Patient Details  Name: [name] MRN: [id number] Date of Birth: 1932-07-07 Referring Provider: Cira Servant, DO   Encounter Date: 12/19/2017  PT End of Session - 12/19/17 1404    Visit Number  3    Number of Visits  26    Date for PT Re-Evaluation  03/01/18    Authorization Type  Medicare & BCBS supplement, no VL, 100% covered    PT Start Time  1315    PT Stop Time  1359    PT Time Calculation (min)  44 min    Equipment Utilized During Treatment  Gait belt    Activity Tolerance  Patient tolerated treatment well    Behavior During Therapy  WFL for tasks assessed/performed       Past Medical History:  Diagnosis Date  . Allergic rhinitis   . Anemia   . Arthritis   . Asthma -COPD   . Blood transfusion 1957  . Breast mass    right breast in milk duct, bx neg  . Chronic fatigue syndrome   . Chronic inflammatory demyelinating polyneuropathy ([facility]) 03/2011  . chronic sinusitis 08/08/2012  . Colon polyp   . Constipation   . COPD (chronic obstructive pulmonary disease) ([facility])   . Cystocele 09/16/2012  . GERD (gastroesophageal reflux disease)   . Hyperlipidemia   . Hypertension   . Hypothyroid   . Neurogenic bladder 2013   husband caths pt, manages with timed void  . Osteoporosis   . Pulmonary embolism ([facility])    1980s  . Renal cyst    Non-complex, contrast MRI 2013 with L>R non-complex cysts, dominant 3.7 left lower pole  . Seizures ([facility])    1990 last seizures on meds Phenobarb  . Shingles late 46's  . Skin cancer    squamous cell  . Thyroid nodule    Right thyroid lobe, only seen on Sagittal imaging measures 2.3 cm in craniocaudal dimension and appears stable  . Tubular adenoma   . Vitamin D deficiency     Past Surgical History:  Procedure Laterality Date  . ABDOMINAL  HYSTERECTOMY    . ANTERIOR FUSION CLIVUS-C2 EXTRAORAL W/ ODONTOID EXCISION  8/14  . APPENDECTOMY    . BASAL CELL CARCINOMA EXCISION     face  . BLADDER SUSPENSION    . BREAST DUCTAL SYSTEM EXCISION Right 01/15/2014   Procedure: EXCISION DUCTAL SYSTEM RIGHT BREAST;  Surgeon: Stark Klein, MD;  Location: [facility];  Service: General;  Laterality: Right;  . BREAST EXCISIONAL BIOPSY    . CARPAL TUNNEL RELEASE     right  . CATARACT EXTRACTION     bilateral  . cervical neck ablation     x 7, C3-C6/3 screws and plate  . CESAREAN SECTION    . CYSTOCELE REPAIR    . DILATION AND CURETTAGE OF UTERUS    . duptyren's contracture right hand    . FINGER ARTHRODESIS Right 05/06/2015   Procedure: RIGHT RING PROXIMAL INTERPHALANGEAL FUSION (PIP);  Surgeon: Leanora Cover, MD;  Location: [facility];  Service: Orthopedics;  Laterality: Right;  . FINGER GANGLION CYST EXCISION     right  . JOINT REPLACEMENT     right and left basal joints of thumbs  . left finger fusion     3 fingers on left hand/one finger right  . left  ovary and tube removed    . NASAL POLYP SURGERY     4 sinus surgeries  . NASAL SEPTUM SURGERY    . PANNICULECTOMY    . PROXIMAL INTERPHALANGEAL FUSION (PIP) Left 09/09/2013   Procedure: FUSION LEFT INDEX PROXIMAL INTERPHALANGEAL JOINT (PIP);  Surgeon: Cammie Sickle., MD;  Location: [facility];  Service: Orthopedics;  Laterality: Left;  . right median nerve decompression    . SHOULDER ARTHROSCOPY     x2 left, 1 right  . sinus surgeries     x 4  . squamous lesions removed     neck and face  . STERIOD INJECTION Right 05/06/2015   Procedure: STEROID INJECTION;  Surgeon: Leanora Cover, MD;  Location: [facility];  Service: Orthopedics;  Laterality: Right;  Right Index Finger Proximal InterPhalangeal Joint Injection  . TONSILLECTOMY AND ADENOIDECTOMY    . TUBAL LIGATION    . vocal polyps removed      There were no vitals  filed for this visit.  Subjective Assessment - 12/19/17 1313    Subjective  No falls. No new changes.     Pertinent History  OA, asthma, COPD, HTN, neuropathy, ant cervical fusion, breast CA, arthroscopic left knee sg, 3 frozen shoulders, Right basal joint replacement Rt hand, left middle finger fusion, carpal tunnel syndrome,     Patient Stated Goals  to improve gait so does not fall.     Currently in Pain?  Yes    Pain Score  6     Pain Location  Neck    Pain Orientation  Left;Posterior;Medial    Pain Descriptors / Indicators  Discomfort    Pain Type  Chronic pain    Pain Onset  More than a month ago          [facility] Adult PT Treatment/Exercise - 12/19/17 1408      Transfers   Sit to Stand  --      High Level Balance   High Level Balance Activities  Side stepping;Marching forwards    High Level Balance Comments  at counter top side stepping x 2 sets with hovering UE on conter min gaurd to maintain balance. Walking marches with UE support and min guard assit x 2 sets. For pt safety Marching in place at counter was added to HEP.       Neuro Re-ed    Neuro Re-ed Details   in corner with chair for safety starting on level surface EO/EC with head turns/ nods then progressing to standing on 2 pillows. On pillows with feet apart with EO x 30 secs then EC x 30 secs. Pt needing min guard to maintain and intermittent UE support with EC to prevent LOB. Head turns and nods with EO x 10 rep x 2 sets each with no UE support and min guard assist to maintain balance. On rocker board fwd/bwd and side/side x 10 reps x 2 sets each. Verbal cueing on tilting board bwd using knees/ ankles and not trunk. Pt needing intermittent UE support for both ex's and min assist to prevent LOB. Holding rocker board steady for 30 sec x 2 sets intermittent UE support/ leaning against wall to catch balance x 2 and min assist to prevent LOB.       Knee/Hip Exercises: Standing   Heel Raises  2 sets;Both;10 reps with UE  support on counter    Other Standing Knee Exercises  sit to stand x 10 reps x 1 set with  S and no UE support          PT Short Term Goals - 12/03/17 1838      PT SHORT TERM GOAL #1   Title  Patient & husband demonstrate understanding of initial HEP. (All STGs Target Date: 01/03/2018)    Time  1    Period  Months    Status  New    Target Date  01/03/18      PT SHORT TERM GOAL #2   Title  Patient demonstrates safe rollator walker use including moving light items to/from seat with verbal cues only.     Time  1    Period  Months    Status  New    Target Date  01/03/18      PT SHORT TERM GOAL #3   Title  Patient negotiates ramps & curbs with rollator walker with modA.     Time  1    Period  Months    Status  New    Target Date  01/03/18        PT Long Term Goals - 12/03/17 1830      PT LONG TERM GOAL #1   Title  Patient & husband verbalize & demonstrate understanding of ongoing HEP. (All LTGs Target Date: 03/01/2018)    Time  3    Period  Months    Status  New    Target Date  03/01/18      PT LONG TERM GOAL #2   Title  Patient & husband verbalize community fitness options including equipment.     Time  3    Period  Months    Status  New    Target Date  03/01/18      PT LONG TERM GOAL #3   Title  Patient ambulates 300' with rollator walker with supervision with safe rollator practices.     Time  3    Period  Months    Status  New    Target Date  03/01/18      PT LONG TERM GOAL #4   Title  Patient negotiates around household furniture, uses rollator walker to transport items like plate and sit to/from stand with rollator walker safely modified independent.     Time  3    Period  Months    Status  New    Target Date  03/01/18      PT LONG TERM GOAL #5   Title  Patient negotiates ramps & curbs with rollator walker with husband assist (minA) safely.     Time  3    Period  Months    Status  New    Target Date  03/01/18      PT LONG TERM GOAL #6   Title  Timed  Up & Go with rollator walker <13.5 seconds to indicate lower fall risk.     Time  3    Period  Months    Status  New    Target Date  03/01/18      PT LONG TERM GOAL #7   Title  Gait velocity with rollator walker >1.8 ft/sec to indicate lower fall risk.     Time  3    Period  Months    Status  New    Target Date  03/01/18      PT LONG TERM GOAL #8   Title  Patient reports neck pain increases <2 increments on 0-10 scale with standing & gait activities.  Time  3    Period  Months    Status  New    Target Date  03/01/18            Plan - 12/19/17 1405    Clinical Impression Statement  Today's session focused on issuing an HEP. Pt is motivated to improve her gait and balance. Pt would benefit from continued PT to progress toward goals and reduce fall risk.    Rehab Potential  Good    Clinical Impairments Affecting Rehab Potential  unsafe rollator walker use    PT Frequency  2x / week    PT Duration  Other (comment) 90 days (13 weeks)    PT Treatment/Interventions  ADLs/Self Care Home Management;DME Instruction;Gait training;Stair training;Functional mobility training;Therapeutic activities;Therapeutic exercise;Balance training;Neuromuscular re-education;Cognitive remediation;Patient/family education;Passive range of motion;Vestibular    PT Next Visit Plan  review safe rollator walker use including ramps & curbs,  keep adding to HEP as pt progresses, LE strengthening and balance on compliant surface    Consulted and Agree with Plan of Care  Patient;Family member/caregiver    Family Member Consulted  husband, Ismelda Weatherman       Patient will benefit from skilled therapeutic intervention in order to improve the following deficits and impairments:  Abnormal gait, Decreased activity tolerance, Decreased balance, Decreased endurance, Decreased knowledge of use of DME, Decreased mobility, Decreased range of motion, Decreased strength, Impaired flexibility, Postural dysfunction,  Pain  Visit Diagnosis: Cervicalgia  Abnormal posture  Muscle weakness (generalized)  Unsteadiness on feet  Other abnormalities of gait and mobility     Problem List Patient Active Problem List   Diagnosis Date Noted  . PCP NOTES >>>>>>>>>>>>>>>>>>> 09/16/2015  . Multiple thyroid nodules 03/30/2015  . Annual physical exam 08/07/2014  . Bloody discharge from nipple - s/p surgery, resolved  12/29/2013  . Breast mass, right 12/11/2013  . At high risk for falls 12/11/2013  . Renal cysts, acquired, bilateral--per urology 05/04/2013  . Elevated LFTs 04/01/2013  . Cervical spinal stenosis 11/21/2012  . Nondiabetic gastroparesis 08/20/2012  . Hypothyroidism 08/08/2012  . Gastroparesis 05/06/2012  . Muscle cramp 01/19/2012  . Fatigue 01/14/2012  . Neurogenic bladder, prolapse ,h/o urinary retention, self cath, sees urology 11/05/2011  . Fecal incontinence 10/03/2011  . Osteoporosis 07/24/2011  . Adenoma of cecum 07/17/2011  . Chronic constipation 07/17/2011  . Skin lesion 07/08/2011  . Chronic neck pain 04/16/2011  . Rectocele 04/16/2011  . Hypersomnia-- on dextroamphetamine 04/16/2011  . Vitamin D deficiency 04/16/2011  . Family history of colon cancer 04/16/2011  . Abnormal blood chemistry 04/16/2011  . Anemia   . COPD (chronic obstructive pulmonary disease) gold stage B   . Depression   . GERD (gastroesophageal reflux disease)   . Hyperlipidemia   . Hypertension   . Allergic rhinitis   . Pulmonary embolism-- in the 52s   . Skin cancer   . Seizures ([place])   . CIDP (chronic inflammatory demyelinating polyneuropathy) , Ataxia, Imbalance -- f/u at [facility], [place] 12/19/2017, 2:22 PM  [facility] [address] [place], [place], [address] Phone: [phone]   Fax:  [phone]  Name: [name] MRN: [id number] Date of Birth: Oct 06, 1932

## 2017-12-21 ENCOUNTER — Encounter: Payer: Self-pay | Admitting: Physical Therapy

## 2017-12-21 ENCOUNTER — Ambulatory Visit: Payer: Medicare Other | Admitting: Physical Therapy

## 2017-12-21 DIAGNOSIS — R293 Abnormal posture: Secondary | ICD-10-CM

## 2017-12-21 DIAGNOSIS — M6281 Muscle weakness (generalized): Secondary | ICD-10-CM

## 2017-12-21 DIAGNOSIS — M542 Cervicalgia: Secondary | ICD-10-CM | POA: Diagnosis not present

## 2017-12-21 DIAGNOSIS — R2681 Unsteadiness on feet: Secondary | ICD-10-CM

## 2017-12-21 DIAGNOSIS — R296 Repeated falls: Secondary | ICD-10-CM

## 2017-12-21 DIAGNOSIS — R2689 Other abnormalities of gait and mobility: Secondary | ICD-10-CM

## 2017-12-21 NOTE — Patient Instructions (Signed)
Access Code: [email or web address]  URL: [email or web address]  Date: 12/21/2017  Prepared by: Willow Ora   Exercises  Sit to Stand - 10 reps - 1 sets - 1x daily - 3-4x weekly  Standing Heel Raise - 10 reps - 1 sets - 5 hold - 1x daily - 3-4x weekly  Side Stepping with Counter Support - 3 reps - 1 sets - 1x daily - 3-4x weekly  Standing Marching - 10 reps - 1 sets - 1x daily - 3-4x weekly  Wide Stance with Head Nods on Foam Pad - 10 reps - 1 sets - 1x daily - 3-4x weekly  Wide Stance with Head Rotation on Foam Pad - 10 reps - 1 sets - 1x daily - 5x weekly  Wide Stance with Eyes Closed - 3 reps - 1 sets - 30 hold - 1x daily - 3-4x weekly

## 2017-12-22 NOTE — Therapy (Signed)
[facility] [address] [place], [place], [address] Phone: [phone]   Fax:  [phone]  Physical Therapy Treatment  Patient Details  Name: [name] MRN: [id number] Date of Birth: 01-28-1933 Referring Provider: Cira Servant, DO   Encounter Date: 12/21/2017  PT End of Session - 12/21/17 1106    Visit Number  4    Number of Visits  26    Date for PT Re-Evaluation  03/01/18    Authorization Type  Medicare & BCBS supplement, no VL, 100% covered    PT Start Time  1104    PT Stop Time  1145    PT Time Calculation (min)  41 min    Equipment Utilized During Treatment  Gait belt    Activity Tolerance  Patient tolerated treatment well    Behavior During Therapy  WFL for tasks assessed/performed       Past Medical History:  Diagnosis Date  . Allergic rhinitis   . Anemia   . Arthritis   . Asthma -COPD   . Blood transfusion 1957  . Breast mass    right breast in milk duct, bx neg  . Chronic fatigue syndrome   . Chronic inflammatory demyelinating polyneuropathy ([facility]) 03/2011  . chronic sinusitis 08/08/2012  . Colon polyp   . Constipation   . COPD (chronic obstructive pulmonary disease) ([facility])   . Cystocele 09/16/2012  . GERD (gastroesophageal reflux disease)   . Hyperlipidemia   . Hypertension   . Hypothyroid   . Neurogenic bladder 2013   husband caths pt, manages with timed void  . Osteoporosis   . Pulmonary embolism ([facility])    1980s  . Renal cyst    Non-complex, contrast MRI 2013 with L>R non-complex cysts, dominant 3.7 left lower pole  . Seizures ([facility])    1990 last seizures on meds Phenobarb  . Shingles late 36's  . Skin cancer    squamous cell  . Thyroid nodule    Right thyroid lobe, only seen on Sagittal imaging measures 2.3 cm in craniocaudal dimension and appears stable  . Tubular adenoma   . Vitamin D deficiency     Past Surgical History:  Procedure Laterality Date  . ABDOMINAL  HYSTERECTOMY    . ANTERIOR FUSION CLIVUS-C2 EXTRAORAL W/ ODONTOID EXCISION  8/14  . APPENDECTOMY    . BASAL CELL CARCINOMA EXCISION     face  . BLADDER SUSPENSION    . BREAST DUCTAL SYSTEM EXCISION Right 01/15/2014   Procedure: EXCISION DUCTAL SYSTEM RIGHT BREAST;  Surgeon: Stark Klein, MD;  Location: [facility];  Service: General;  Laterality: Right;  . BREAST EXCISIONAL BIOPSY    . CARPAL TUNNEL RELEASE     right  . CATARACT EXTRACTION     bilateral  . cervical neck ablation     x 7, C3-C6/3 screws and plate  . CESAREAN SECTION    . CYSTOCELE REPAIR    . DILATION AND CURETTAGE OF UTERUS    . duptyren's contracture right hand    . FINGER ARTHRODESIS Right 05/06/2015   Procedure: RIGHT RING PROXIMAL INTERPHALANGEAL FUSION (PIP);  Surgeon: Leanora Cover, MD;  Location: [facility];  Service: Orthopedics;  Laterality: Right;  . FINGER GANGLION CYST EXCISION     right  . JOINT REPLACEMENT     right and left basal joints of thumbs  . left finger fusion     3 fingers on left hand/one finger right  . left  ovary and tube removed    . NASAL POLYP SURGERY     4 sinus surgeries  . NASAL SEPTUM SURGERY    . PANNICULECTOMY    . PROXIMAL INTERPHALANGEAL FUSION (PIP) Left 09/09/2013   Procedure: FUSION LEFT INDEX PROXIMAL INTERPHALANGEAL JOINT (PIP);  Surgeon: Cammie Sickle., MD;  Location: [facility];  Service: Orthopedics;  Laterality: Left;  . right median nerve decompression    . SHOULDER ARTHROSCOPY     x2 left, 1 right  . sinus surgeries     x 4  . squamous lesions removed     neck and face  . STERIOD INJECTION Right 05/06/2015   Procedure: STEROID INJECTION;  Surgeon: Leanora Cover, MD;  Location: [facility];  Service: Orthopedics;  Laterality: Right;  Right Index Finger Proximal InterPhalangeal Joint Injection  . TONSILLECTOMY AND ADENOIDECTOMY    . TUBAL LIGATION    . vocal polyps removed      There were no vitals  filed for this visit.  Subjective Assessment - 12/21/17 1105    Subjective  No new complaints. No falls to report. Reports doing her exercises with no issues.     Pertinent History  OA, asthma, COPD, HTN, neuropathy, ant cervical fusion, breast CA, arthroscopic left knee sg, 3 frozen shoulders, Right basal joint replacement Rt hand, left middle finger fusion, carpal tunnel syndrome,     Patient Stated Goals  to improve gait so does not fall.     Currently in Pain?  Yes    Pain Score  5     Pain Location  Neck    Pain Orientation  Posterior;Left;Right    Pain Descriptors / Indicators  Aching;Discomfort    Pain Type  Chronic pain    Pain Onset  More than a month ago    Pain Frequency  Constant    Aggravating Factors   moving neck certain ways    Pain Relieving Factors  pain patch          [facility] Adult PT Treatment/Exercise - 12/21/17 1107      Transfers   Transfers  Sit to Stand;Stand to Sit    Sit to Stand  5: Supervision;With upper extremity assist;With armrests;From chair/3-in-1    Stand to Sit  5: Supervision;With upper extremity assist;With armrests;To chair/3-in-1      Ambulation/Gait   Ambulation/Gait  Yes    Ambulation/Gait Assistance  5: Supervision    Ambulation/Gait Assistance Details  pt with hands on brakes entire time, cues to relax arms as she did have them held straight keeping rollator far away.     Ambulation Distance (Feet)  500 Feet x1 in/outdoors; 130 x1 weaving around furniture    Assistive device  Rollator    Gait Pattern  Step-through pattern;Decreased stride length    Ambulation Surface  Level;Unlevel;Indoor;Outdoor;Paved    Ramp  4: Min assist;Other (comment) min guard assist    Ramp Details (indicate cue type and reason)  x2 reps on indoor ramp with cues on sequening and rollator use    Curb  4: Min assist    Curb Details (indicate cue type and reason)  x2 reps with indoor 6 inch curb with cues on sequencing and rollator use      High Level Balance    High Level Balance Activities  Side stepping;Marching forwards;Marching backwards    High Level Balance Comments  on red mats next to counter top: 3 laps each fwd/bwd with hands hovering over counter  with side stepping, single hand hold with marching with min assist needed with marching (min guard assist for side stepping).      Neuro Re-ed    Neuro Re-ed Details   reviewed HEP issued at last session with frequency modified, removed static standing with feet apart EO in corner due to too easy and added feet apart on solid surface with EC.         PT Education - 12/21/17 1627    Education Details  updated HEP    Person(s) Educated  Patient;Spouse    Methods  Explanation;Demonstration;Verbal cues;Handout    Comprehension  Verbalized understanding;Returned demonstration;Verbal cues required;Need further instruction      Access Code: [email or web address]  URL: [email or web address]  Date: 12/21/2017  Prepared by: Willow Ora   Exercises  Sit to Stand - 10 reps - 1 sets - 1x daily - 3-4x weekly  Standing Heel Raise - 10 reps - 1 sets - 5 hold - 1x daily - 3-4x weekly  Side Stepping with Counter Support - 3 reps - 1 sets - 1x daily - 3-4x weekly  Standing Marching - 10 reps - 1 sets - 1x daily - 3-4x weekly  Wide Stance with Head Nods on Foam Pad - 10 reps - 1 sets - 1x daily - 3-4x weekly  Wide Stance with Head Rotation on Foam Pad - 10 reps - 1 sets - 1x daily - 5x weekly  Wide Stance with Eyes Closed - 3 reps - 1 sets - 30 hold - 1x daily - 3-4x weekly    PT Short Term Goals - 12/03/17 1838      PT SHORT TERM GOAL #1   Title  Patient & husband demonstrate understanding of initial HEP. (All STGs Target Date: 01/03/2018)    Time  1    Period  Months    Status  New    Target Date  01/03/18      PT SHORT TERM GOAL #2   Title  Patient demonstrates safe rollator walker use including moving light items to/from seat with verbal cues only.     Time  1    Period  Months    Status  New     Target Date  01/03/18      PT SHORT TERM GOAL #3   Title  Patient negotiates ramps & curbs with rollator walker with modA.     Time  1    Period  Months    Status  New    Target Date  01/03/18        PT Long Term Goals - 12/03/17 1830      PT LONG TERM GOAL #1   Title  Patient & husband verbalize & demonstrate understanding of ongoing HEP. (All LTGs Target Date: 03/01/2018)    Time  3    Period  Months    Status  New    Target Date  03/01/18      PT LONG TERM GOAL #2   Title  Patient & husband verbalize community fitness options including equipment.     Time  3    Period  Months    Status  New    Target Date  03/01/18      PT LONG TERM GOAL #3   Title  Patient ambulates 300' with rollator walker with supervision with safe rollator practices.     Time  3    Period  Months    Status  New  Target Date  03/01/18      PT LONG TERM GOAL #4   Title  Patient negotiates around household furniture, uses rollator walker to transport items like plate and sit to/from stand with rollator walker safely modified independent.     Time  3    Period  Months    Status  New    Target Date  03/01/18      PT LONG TERM GOAL #5   Title  Patient negotiates ramps & curbs with rollator walker with husband assist (minA) safely.     Time  3    Period  Months    Status  New    Target Date  03/01/18      PT LONG TERM GOAL #6   Title  Timed Up & Go with rollator walker <13.5 seconds to indicate lower fall risk.     Time  3    Period  Months    Status  New    Target Date  03/01/18      PT LONG TERM GOAL #7   Title  Gait velocity with rollator walker >1.8 ft/sec to indicate lower fall risk.     Time  3    Period  Months    Status  New    Target Date  03/01/18      PT LONG TERM GOAL #8   Title  Patient reports neck pain increases <2 increments on 0-10 scale with standing & gait activities.     Time  3    Period  Months    Status  New    Target Date  03/01/18             Plan - 12/21/17 1106    Clinical Impression Statement  Today's skilled session continued to focus on rollator use/safety and on balance reactions. No issues reported by pt during session. Pt does continue to need cues to lock rollator with sit/stand transfers, however she now consistenly keeps her hands on the brakes during gait for safety without cues. Pt is progressing well toward goals and should benefit from continued PT to progress toward unmet goals.     Rehab Potential  Good    Clinical Impairments Affecting Rehab Potential  unsafe rollator walker use    PT Frequency  2x / week    PT Duration  Other (comment) 90 days (13 weeks)    PT Treatment/Interventions  ADLs/Self Care Home Management;DME Instruction;Gait training;Stair training;Functional mobility training;Therapeutic activities;Therapeutic exercise;Balance training;Neuromuscular re-education;Cognitive remediation;Patient/family education;Passive range of motion;Vestibular    PT Next Visit Plan  review safe rollator walker use including ramps & curbs,  keep adding to HEP as pt progresses, LE strengthening and balance on compliant surface    Consulted and Agree with Plan of Care  Patient;Family member/caregiver    Family Member Consulted  husband, Sieanna Vanstone       Patient will benefit from skilled therapeutic intervention in order to improve the following deficits and impairments:  Abnormal gait, Decreased activity tolerance, Decreased balance, Decreased endurance, Decreased knowledge of use of DME, Decreased mobility, Decreased range of motion, Decreased strength, Impaired flexibility, Postural dysfunction, Pain  Visit Diagnosis: Abnormal posture  Muscle weakness (generalized)  Unsteadiness on feet  Other abnormalities of gait and mobility  Repeated falls     Problem List Patient Active Problem List   Diagnosis Date Noted  . PCP NOTES >>>>>>>>>>>>>>>>>>> 09/16/2015  . Multiple thyroid nodules 03/30/2015   . Annual physical exam 08/07/2014  .  Bloody discharge from nipple - s/p surgery, resolved  12/29/2013  . Breast mass, right 12/11/2013  . At high risk for falls 12/11/2013  . Renal cysts, acquired, bilateral--per urology 05/04/2013  . Elevated LFTs 04/01/2013  . Cervical spinal stenosis 11/21/2012  . Nondiabetic gastroparesis 08/20/2012  . Hypothyroidism 08/08/2012  . Gastroparesis 05/06/2012  . Muscle cramp 01/19/2012  . Fatigue 01/14/2012  . Neurogenic bladder, prolapse ,h/o urinary retention, self cath, sees urology 11/05/2011  . Fecal incontinence 10/03/2011  . Osteoporosis 07/24/2011  . Adenoma of cecum 07/17/2011  . Chronic constipation 07/17/2011  . Skin lesion 07/08/2011  . Chronic neck pain 04/16/2011  . Rectocele 04/16/2011  . Hypersomnia-- on dextroamphetamine 04/16/2011  . Vitamin D deficiency 04/16/2011  . Family history of colon cancer 04/16/2011  . Abnormal blood chemistry 04/16/2011  . Anemia   . COPD (chronic obstructive pulmonary disease) gold stage B   . Depression   . GERD (gastroesophageal reflux disease)   . Hyperlipidemia   . Hypertension   . Allergic rhinitis   . Pulmonary embolism-- in the 26s   . Skin cancer   . Seizures ([facility])   . CIDP (chronic inflammatory demyelinating polyneuropathy) , Ataxia, Imbalance -- f/u at [facility], PTA, [facility] [address], [place], [place] [address] [phone] 12/22/17, 4:27 PM   Name: KIMBERLYE DILGER MRN: [id number] Date of Birth: 07-26-1932

## 2017-12-25 ENCOUNTER — Ambulatory Visit: Payer: Medicare Other | Admitting: Physical Therapy

## 2017-12-25 ENCOUNTER — Encounter: Payer: Self-pay | Admitting: Physical Therapy

## 2017-12-25 DIAGNOSIS — M542 Cervicalgia: Secondary | ICD-10-CM | POA: Diagnosis not present

## 2017-12-25 DIAGNOSIS — R2681 Unsteadiness on feet: Secondary | ICD-10-CM

## 2017-12-25 DIAGNOSIS — R2689 Other abnormalities of gait and mobility: Secondary | ICD-10-CM

## 2017-12-25 DIAGNOSIS — M6281 Muscle weakness (generalized): Secondary | ICD-10-CM

## 2017-12-25 DIAGNOSIS — R293 Abnormal posture: Secondary | ICD-10-CM

## 2017-12-25 NOTE — Patient Instructions (Signed)
Access Code: [email or web address]  URL: [email or web address]  Date: 12/25/2017  Prepared by: Jamey Reas   Exercises Cervical Retraction at Wall - 10 reps - 1 sets - 5 seconds hold - 3x daily - 7x weekly Seated Pursed Lip Breathing - 10 reps - 1 sets - 5 seconds hold - 3x daily - 7x weekly Seated Shoulder Setting - 10 reps - 1 sets - 5 seconds hold - 3x daily - 7x weekly Seated Cervical Retraction and Rotation - 10 reps - 1 sets - 1 breath hold - 1-3x daily - 7x weekly Seated Cervical Rotation with Nod - 10 reps - 1 sets - 1 breath hold - 1-3x daily - 7x weekly Seated Neck Sidebending Stretch with Pillow - 10 reps - 1 sets - 1-2 breaths hold - 1x daily - 7x weekly

## 2017-12-26 ENCOUNTER — Other Ambulatory Visit: Payer: Self-pay | Admitting: General Surgery

## 2017-12-26 DIAGNOSIS — Z1231 Encounter for screening mammogram for malignant neoplasm of breast: Secondary | ICD-10-CM

## 2017-12-26 NOTE — Therapy (Signed)
[facility] [address] [place], [place], [address] Phone: [phone]   Fax:  [phone]  Physical Therapy Treatment  Patient Details  Name: [name] MRN: [id number] Date of Birth: July 31, 1932 Referring Provider: Cira Servant, DO   Encounter Date: 12/25/2017  PT End of Session - 12/25/17 1208    Visit Number  5    Number of Visits  26    Date for PT Re-Evaluation  03/01/18    Authorization Type  Medicare & BCBS supplement, no VL, 100% covered    PT Start Time  1100    PT Stop Time  1148    PT Time Calculation (min)  48 min    Equipment Utilized During Treatment  Gait belt    Activity Tolerance  Patient tolerated treatment well    Behavior During Therapy  WFL for tasks assessed/performed       Past Medical History:  Diagnosis Date  . Allergic rhinitis   . Anemia   . Arthritis   . Asthma -COPD   . Blood transfusion 1957  . Breast mass    right breast in milk duct, bx neg  . Chronic fatigue syndrome   . Chronic inflammatory demyelinating polyneuropathy ([place]) 03/2011  . chronic sinusitis 08/08/2012  . Colon polyp   . Constipation   . COPD (chronic obstructive pulmonary disease) ([place])   . Cystocele 09/16/2012  . GERD (gastroesophageal reflux disease)   . Hyperlipidemia   . Hypertension   . Hypothyroid   . Neurogenic bladder 2013   husband caths pt, manages with timed void  . Osteoporosis   . Pulmonary embolism ([place])    1980s  . Renal cyst    Non-complex, contrast MRI 2013 with L>R non-complex cysts, dominant 3.7 left lower pole  . Seizures ([place])    1990 last seizures on meds Phenobarb  . Shingles late 109's  . Skin cancer    squamous cell  . Thyroid nodule    Right thyroid lobe, only seen on Sagittal imaging measures 2.3 cm in craniocaudal dimension and appears stable  . Tubular adenoma   . Vitamin D deficiency     Past Surgical History:  Procedure Laterality Date  . ABDOMINAL  HYSTERECTOMY    . ANTERIOR FUSION CLIVUS-C2 EXTRAORAL W/ ODONTOID EXCISION  8/14  . APPENDECTOMY    . BASAL CELL CARCINOMA EXCISION     face  . BLADDER SUSPENSION    . BREAST DUCTAL SYSTEM EXCISION Right 01/15/2014   Procedure: EXCISION DUCTAL SYSTEM RIGHT BREAST;  Surgeon: Stark Klein, MD;  Location: [facility];  Service: General;  Laterality: Right;  . BREAST EXCISIONAL BIOPSY    . CARPAL TUNNEL RELEASE     right  . CATARACT EXTRACTION     bilateral  . cervical neck ablation     x 7, C3-C6/3 screws and plate  . CESAREAN SECTION    . CYSTOCELE REPAIR    . DILATION AND CURETTAGE OF UTERUS    . duptyren's contracture right hand    . FINGER ARTHRODESIS Right 05/06/2015   Procedure: RIGHT RING PROXIMAL INTERPHALANGEAL FUSION (PIP);  Surgeon: Leanora Cover, MD;  Location: [facility];  Service: Orthopedics;  Laterality: Right;  . FINGER GANGLION CYST EXCISION     right  . JOINT REPLACEMENT     right and left basal joints of thumbs  . left finger fusion     3 fingers on left hand/one finger right  . left  ovary and tube removed    . NASAL POLYP SURGERY     4 sinus surgeries  . NASAL SEPTUM SURGERY    . PANNICULECTOMY    . PROXIMAL INTERPHALANGEAL FUSION (PIP) Left 09/09/2013   Procedure: FUSION LEFT INDEX PROXIMAL INTERPHALANGEAL JOINT (PIP);  Surgeon: Cammie Sickle., MD;  Location: [facility];  Service: Orthopedics;  Laterality: Left;  . right median nerve decompression    . SHOULDER ARTHROSCOPY     x2 left, 1 right  . sinus surgeries     x 4  . squamous lesions removed     neck and face  . STERIOD INJECTION Right 05/06/2015   Procedure: STEROID INJECTION;  Surgeon: Leanora Cover, MD;  Location: [facility];  Service: Orthopedics;  Laterality: Right;  Right Index Finger Proximal InterPhalangeal Joint Injection  . TONSILLECTOMY AND ADENOIDECTOMY    . TUBAL LIGATION    . vocal polyps removed      There were no vitals  filed for this visit.  Subjective Assessment - 12/25/17 1105    Subjective  No falls. She is using rollator always. She has not needed oxygen.     Pertinent History  OA, asthma, COPD, HTN, neuropathy, ant cervical fusion, breast CA, arthroscopic left knee sg, 3 frozen shoulders, Right basal joint replacement Rt hand, left middle finger fusion, carpal tunnel syndrome,     Patient Stated Goals  to improve gait so does not fall.     Currently in Pain?  Yes    Pain Score  5  9/10 before patch that husband applied in PT waiting room    Pain Location  Neck    Pain Orientation  Posterior;Left;Right    Pain Descriptors / Indicators  Aching;Discomfort    Pain Type  Chronic pain    Pain Onset  More than a month ago    Pain Frequency  Constant    Aggravating Factors   moving certain ways    Pain Relieving Factors  pain patches      Therapeutic Exercise: PT instructed in cervical exercises incorporating pursed lip breathing to assist with COPD issues & neck pain. PT instructed in benefits to use of recumbent (seated with back support) machines that use both arms & legs to operate like SciFit stepper. PT recommended low intensity Level 1 at comfortable pace (mid 70's rpm for her) and doing 3 sets with full rest between sets. Pt performed SciFit recumbent stepper level 1 with BUEs & BLEs 3 min work, 3 min rest, 4 min work, 3 min rest without issues. SpO2 93% or higher at all times. No issues with dyspnea other than increased respiration expected with activity. PT instructed in "Talk-Sing Test" to help determine intensity. Pt verbalized understanding.   Gait Training: PT instructed in need to sit down first by squaring feet with chair to limit torsion of hips and control descent to limit spinal compression. Pt has osteoporosis & sitting incorrectly can contribute to fracture issues. Pt verbalized & return demo understanding. PT reminded pt of safe hand position on rollator brakes during ambulation.                           PT Education - 12/25/17 1210    Education Details  cervical ROM & posture MedBridge Access Code: [email or web address]   osteoporosis & sitting technique to mimimize fractures and recumbent machines at [facility]) Educated  Patient  Methods  Explanation;Demonstration;Tactile cues;Verbal cues;Handout    Comprehension  Verbalized understanding;Returned demonstration;Verbal cues required;Need further instruction       PT Short Term Goals - 12/25/17 1208      PT SHORT TERM GOAL #1   Title  Patient & husband demonstrate understanding of initial HEP. (All STGs Target Date: 01/03/2018)    Time  1    Period  Months    Status  On-going    Target Date  01/03/18      PT SHORT TERM GOAL #2   Title  Patient demonstrates safe rollator walker use including moving light items to/from seat with verbal cues only.     Time  1    Period  Months    Status  On-going    Target Date  01/03/18      PT SHORT TERM GOAL #3   Title  Patient negotiates ramps & curbs with rollator walker with modA.     Time  1    Period  Months    Status  On-going    Target Date  01/03/18        PT Long Term Goals - 12/25/17 1209      PT LONG TERM GOAL #1   Title  Patient & husband verbalize & demonstrate understanding of ongoing HEP. (All LTGs Target Date: 03/01/2018)    Time  3    Period  Months    Status  New    Target Date  03/01/18      PT LONG TERM GOAL #2   Title  Patient & husband verbalize community fitness options including equipment.     Time  3    Period  Months    Status  On-going    Target Date  03/01/18      PT LONG TERM GOAL #3   Title  Patient ambulates 300' with rollator walker with supervision with safe rollator practices.     Time  3    Period  Months    Status  On-going    Target Date  03/01/18      PT LONG TERM GOAL #4   Title  Patient negotiates around household furniture, uses rollator walker to transport items like plate and sit to/from  stand with rollator walker safely modified independent.     Time  3    Period  Months    Status  On-going    Target Date  03/01/18      PT LONG TERM GOAL #5   Title  Patient negotiates ramps & curbs with rollator walker with husband assist (minA) safely.     Time  3    Period  Months    Status  On-going    Target Date  03/01/18      PT LONG TERM GOAL #6   Title  Timed Up & Go with rollator walker <13.5 seconds to indicate lower fall risk.     Time  3    Period  Months    Status  On-going    Target Date  03/01/18      PT LONG TERM GOAL #7   Title  Gait velocity with rollator walker >1.8 ft/sec to indicate lower fall risk.     Time  3    Period  Months    Status  On-going    Target Date  03/01/18      PT LONG TERM GOAL #8   Title  Patient reports neck pain increases <2  increments on 0-10 scale with standing & gait activities.     Time  3    Period  Months    Status  On-going    Target Date  03/01/18            Plan - 12/25/17 1211    Clinical Impression Statement  Today's skilled session focused on upper body posture & cervical ROM. PT also initiated education on exercise equipment appropriate at [facility].     Rehab Potential  Good    Clinical Impairments Affecting Rehab Potential  unsafe rollator walker use    PT Frequency  2x / week    PT Duration  Other (comment) 90 days (13 weeks)    PT Treatment/Interventions  ADLs/Self Care Home Management;DME Instruction;Gait training;Stair training;Functional mobility training;Therapeutic activities;Therapeutic exercise;Balance training;Neuromuscular re-education;Cognitive remediation;Patient/family education;Passive range of motion;Vestibular    PT Next Visit Plan  review cervical exercises and SciFit stepper use; gait with rollator including ramps & curbs    PT Home Exercise Plan  MedBridge  Access Code: [email or web address]    Consulted and Agree with Plan of Care  Patient;Family member/caregiver    Family Member Consulted  husband, Jaiah Weigel       Patient will benefit from skilled therapeutic intervention in order to improve the following deficits and impairments:  Abnormal gait, Decreased activity tolerance, Decreased balance, Decreased endurance, Decreased knowledge of use of DME, Decreased mobility, Decreased range of motion, Decreased strength, Impaired flexibility, Postural dysfunction, Pain  Visit Diagnosis: Abnormal posture  Muscle weakness (generalized)  Unsteadiness on feet  Other abnormalities of gait and mobility  Cervicalgia     Problem List Patient Active Problem List   Diagnosis Date Noted  . PCP NOTES >>>>>>>>>>>>>>>>>>> 09/16/2015  . Multiple thyroid nodules 03/30/2015  . Annual physical exam 08/07/2014  . Bloody discharge from nipple - s/p surgery, resolved  12/29/2013  . Breast mass, right 12/11/2013  . At high risk for falls 12/11/2013  . Renal cysts, acquired, bilateral--per urology 05/04/2013  . Elevated LFTs 04/01/2013  . Cervical spinal stenosis 11/21/2012  . Nondiabetic gastroparesis 08/20/2012  . Hypothyroidism 08/08/2012  . Gastroparesis 05/06/2012  . Muscle cramp 01/19/2012  . Fatigue 01/14/2012  . Neurogenic bladder, prolapse ,h/o urinary retention, self cath, sees urology 11/05/2011  . Fecal incontinence 10/03/2011  . Osteoporosis 07/24/2011  . Adenoma of cecum 07/17/2011  . Chronic constipation 07/17/2011  . Skin lesion 07/08/2011  . Chronic neck pain 04/16/2011  . Rectocele 04/16/2011  . Hypersomnia-- on dextroamphetamine 04/16/2011  . Vitamin D deficiency 04/16/2011  . Family history of colon cancer 04/16/2011  . Abnormal blood chemistry 04/16/2011  . Anemia   . COPD (chronic obstructive pulmonary disease) gold stage B   . Depression   . GERD (gastroesophageal reflux disease)   . Hyperlipidemia   . Hypertension   . Allergic rhinitis   . Pulmonary embolism-- in the 50s   . Skin cancer   . Seizures ([place])   . CIDP (chronic inflammatory demyelinating  polyneuropathy) , Ataxia, Imbalance -- f/u at [facility], DPT 12/26/2017, 9:14 AM  [facility] [address] [place], [place], [address] Phone: [phone]   Fax:  [phone]  Name: [name] MRN: [id number] Date of Birth: Sep 29, 1932

## 2017-12-28 ENCOUNTER — Ambulatory Visit: Payer: Medicare Other | Admitting: Physical Therapy

## 2017-12-31 ENCOUNTER — Ambulatory Visit: Payer: Medicare Other | Attending: Rehabilitation | Admitting: Physical Therapy

## 2017-12-31 ENCOUNTER — Encounter: Payer: Self-pay | Admitting: Physical Therapy

## 2017-12-31 DIAGNOSIS — M542 Cervicalgia: Secondary | ICD-10-CM | POA: Insufficient documentation

## 2017-12-31 DIAGNOSIS — M6281 Muscle weakness (generalized): Secondary | ICD-10-CM | POA: Diagnosis present

## 2017-12-31 DIAGNOSIS — R2689 Other abnormalities of gait and mobility: Secondary | ICD-10-CM

## 2017-12-31 DIAGNOSIS — R2681 Unsteadiness on feet: Secondary | ICD-10-CM | POA: Diagnosis present

## 2017-12-31 DIAGNOSIS — R293 Abnormal posture: Secondary | ICD-10-CM

## 2017-12-31 NOTE — Therapy (Signed)
[facility] [address] [place], [place], [address] Phone: [phone]   Fax:  [phone]  Physical Therapy Treatment  Patient Details  Name: [name] MRN: [id number] Date of Birth: 12-07-1932 Referring Provider: Cira Servant, DO   Encounter Date: 12/31/2017  PT End of Session - 12/31/17 1152    Visit Number  6    Number of Visits  26    Date for PT Re-Evaluation  03/01/18    Authorization Type  Medicare & BCBS supplement, no VL, 100% covered    PT Start Time  1150    PT Stop Time  1230    PT Time Calculation (min)  40 min    Equipment Utilized During Treatment  Gait belt    Activity Tolerance  Patient tolerated treatment well    Behavior During Therapy  WFL for tasks assessed/performed       Past Medical History:  Diagnosis Date  . Allergic rhinitis   . Anemia   . Arthritis   . Asthma -COPD   . Blood transfusion 1957  . Breast mass    right breast in milk duct, bx neg  . Chronic fatigue syndrome   . Chronic inflammatory demyelinating polyneuropathy ([place]) 03/2011  . chronic sinusitis 08/08/2012  . Colon polyp   . Constipation   . COPD (chronic obstructive pulmonary disease) ([facility])   . Cystocele 09/16/2012  . GERD (gastroesophageal reflux disease)   . Hyperlipidemia   . Hypertension   . Hypothyroid   . Neurogenic bladder 2013   husband caths pt, manages with timed void  . Osteoporosis   . Pulmonary embolism ([place])    1980s  . Renal cyst    Non-complex, contrast MRI 2013 with L>R non-complex cysts, dominant 3.7 left lower pole  . Seizures ([place])    1990 last seizures on meds Phenobarb  . Shingles late 61's  . Skin cancer    squamous cell  . Thyroid nodule    Right thyroid lobe, only seen on Sagittal imaging measures 2.3 cm in craniocaudal dimension and appears stable  . Tubular adenoma   . Vitamin D deficiency     Past Surgical History:  Procedure Laterality Date  . ABDOMINAL  HYSTERECTOMY    . ANTERIOR FUSION CLIVUS-C2 EXTRAORAL W/ ODONTOID EXCISION  8/14  . APPENDECTOMY    . BASAL CELL CARCINOMA EXCISION     face  . BLADDER SUSPENSION    . BREAST DUCTAL SYSTEM EXCISION Right 01/15/2014   Procedure: EXCISION DUCTAL SYSTEM RIGHT BREAST;  Surgeon: Stark Klein, MD;  Location: [facility];  Service: General;  Laterality: Right;  . BREAST EXCISIONAL BIOPSY    . CARPAL TUNNEL RELEASE     right  . CATARACT EXTRACTION     bilateral  . cervical neck ablation     x 7, C3-C6/3 screws and plate  . CESAREAN SECTION    . CYSTOCELE REPAIR    . DILATION AND CURETTAGE OF UTERUS    . duptyren's contracture right hand    . FINGER ARTHRODESIS Right 05/06/2015   Procedure: RIGHT RING PROXIMAL INTERPHALANGEAL FUSION (PIP);  Surgeon: Leanora Cover, MD;  Location: [facility];  Service: Orthopedics;  Laterality: Right;  . FINGER GANGLION CYST EXCISION     right  . JOINT REPLACEMENT     right and left basal joints of thumbs  . left finger fusion     3 fingers on left hand/one finger right  . left  ovary and tube removed    . NASAL POLYP SURGERY     4 sinus surgeries  . NASAL SEPTUM SURGERY    . PANNICULECTOMY    . PROXIMAL INTERPHALANGEAL FUSION (PIP) Left 09/09/2013   Procedure: FUSION LEFT INDEX PROXIMAL INTERPHALANGEAL JOINT (PIP);  Surgeon: Cammie Sickle., MD;  Location: [facility];  Service: Orthopedics;  Laterality: Left;  . right median nerve decompression    . SHOULDER ARTHROSCOPY     x2 left, 1 right  . sinus surgeries     x 4  . squamous lesions removed     neck and face  . STERIOD INJECTION Right 05/06/2015   Procedure: STEROID INJECTION;  Surgeon: Leanora Cover, MD;  Location: [facility];  Service: Orthopedics;  Laterality: Right;  Right Index Finger Proximal InterPhalangeal Joint Injection  . TONSILLECTOMY AND ADENOIDECTOMY    . TUBAL LIGATION    . vocal polyps removed      There were no vitals  filed for this visit.  Subjective Assessment - 12/31/17 1151    Subjective  No new coplaints. No issues with neck or any other pain at this time. No falls to report.     Pertinent History  OA, asthma, COPD, HTN, neuropathy, ant cervical fusion, breast CA, arthroscopic left knee sg, 3 frozen shoulders, Right basal joint replacement Rt hand, left middle finger fusion, carpal tunnel syndrome,     Patient Stated Goals  to improve gait so does not fall.     Currently in Pain?  No/denies    Pain Score  0-No pain          [facility] Adult PT Treatment/Exercise - 12/31/17 1153      Transfers   Transfers  Sit to Stand;Stand to Sit    Sit to Stand  5: Supervision;With upper extremity assist;With armrests;From chair/3-in-1    Sit to Stand Details (indicate cue type and reason)  cues needed for safety with standing with rollator: pt tends to park it to the side and does not bring it in front of her with standing. cues to ensure it's locked if using it to stand up as well.     Stand to Sit  5: Supervision;With upper extremity assist;With armrests;To chair/3-in-1    Stand to Sit Details  cues to turn fully to sit safely and not "swing" hips around to sit down. cues to reach back and use arms to controll desent with sitting down as well.       Ambulation/Gait   Ambulation/Gait  Yes    Ambulation/Gait Assistance  5: Supervision    Ambulation/Gait Assistance Details  occasional cues to relax UE's (for some elbow flexion and shouler depression) and to stay closer to rollator with gait. pt demo'd good hand placement on rollator with gait     Ambulation Distance (Feet)  1000 Feet x1, plus around gym with activity    Assistive device  Rollator    Gait Pattern  Step-through pattern;Decreased stride length    Ambulation Surface  Level;Unlevel;Indoor;Outdoor;Paved    Curb  Other (comment) min guard assist    Curb Details (indicate cue type and reason)  x1 on outddoor curb with rollator      High Level Balance    High Level Balance Activities  Side stepping;Marching forwards;Marching backwards;Tandem walking    High Level Balance Comments  on red/blue mats next to counter top: 3 laps each with light UE support/HHA at times on other side with cues on  posture, ex form and to slow down for safety.       Knee/Hip Exercises: Aerobic   Other Aerobic  Scifit stepper with UE/LE's at level 1.0 for 4 minutes with goal rpm between 70-80, rest for 3 minutes with SaO2 99% after the 4 minutes work, no dyspnea/SOB, then worked for 4 more minutes at level 1.0 with goal 70-80 rpm, SaO2 level 98% after with no SOB/dyspnea.                                  PT Short Term Goals - 12/31/17 1153      PT SHORT TERM GOAL #1   Title  Patient & husband demonstrate understanding of initial HEP. (All STGs Target Date: 01/03/2018)    Baseline  12/31/17: met today with current program    Status  Achieved      PT SHORT TERM GOAL #2   Title  Patient demonstrates safe rollator walker use including moving light items to/from seat with verbal cues only.     Baseline  12/31/17: met per pt/spouse report as she is doing this at home, no need to simulate in session today.     Time  --    Period  --    Status  Achieved      PT SHORT TERM GOAL #3   Title  Patient negotiates ramps & curbs with rollator walker with modA.     Baseline  12/31/17: met today with rollator with min guard to min assist    Time  --    Period  --    Status  Achieved        PT Long Term Goals - 12/25/17 1209      PT LONG TERM GOAL #1   Title  Patient & husband verbalize & demonstrate understanding of ongoing HEP. (All LTGs Target Date: 03/01/2018)    Time  3    Period  Months    Status  New    Target Date  03/01/18      PT LONG TERM GOAL #2   Title  Patient & husband verbalize community fitness options including equipment.     Time  3    Period  Months    Status  On-going    Target Date  03/01/18      PT LONG TERM GOAL #3   Title  Patient ambulates  300' with rollator walker with supervision with safe rollator practices.     Time  3    Period  Months    Status  On-going    Target Date  03/01/18      PT LONG TERM GOAL #4   Title  Patient negotiates around household furniture, uses rollator walker to transport items like plate and sit to/from stand with rollator walker safely modified independent.     Time  3    Period  Months    Status  On-going    Target Date  03/01/18      PT LONG TERM GOAL #5   Title  Patient negotiates ramps & curbs with rollator walker with husband assist (minA) safely.     Time  3    Period  Months    Status  On-going    Target Date  03/01/18      PT LONG TERM GOAL #6   Title  Timed Up & Go with rollator walker <13.5 seconds to indicate  lower fall risk.     Time  3    Period  Months    Status  On-going    Target Date  03/01/18      PT LONG TERM GOAL #7   Title  Gait velocity with rollator walker >1.8 ft/sec to indicate lower fall risk.     Time  3    Period  Months    Status  On-going    Target Date  03/01/18      PT LONG TERM GOAL #8   Title  Patient reports neck pain increases <2 increments on 0-10 scale with standing & gait activities.     Time  3    Period  Months    Status  On-going    Target Date  03/01/18            Plan - 12/31/17 1152    Clinical Impression Statement  Today's skilled session focused on progress toward STGs with all goals met. Remainder of session continued to focus on safety with rollator, balance and strengthening/activity toleracne. No issues noted with session. Pt is progressing well toward goals and should benefit from continued PT to progress toward unmet LTGs.                         Rehab Potential  Good    Clinical Impairments Affecting Rehab Potential  unsafe rollator walker use    PT Frequency  2x / week    PT Duration  Other (comment) 90 days (13 weeks)    PT Treatment/Interventions  ADLs/Self Care Home Management;DME Instruction;Gait training;Stair  training;Functional mobility training;Therapeutic activities;Therapeutic exercise;Balance training;Neuromuscular re-education;Cognitive remediation;Patient/family education;Passive range of motion;Vestibular    PT Next Visit Plan  continue with balance and strengthening toward LTGs    PT Home Exercise Plan  MedBridge  Access Code: [email or web address]    Consulted and Agree with Plan of Care  Patient;Family member/caregiver    Family Member Consulted  husband, Salimah Martinovich       Patient will benefit from skilled therapeutic intervention in order to improve the following deficits and impairments:  Abnormal gait, Decreased activity tolerance, Decreased balance, Decreased endurance, Decreased knowledge of use of DME, Decreased mobility, Decreased range of motion, Decreased strength, Impaired flexibility, Postural dysfunction, Pain  Visit Diagnosis: Abnormal posture  Muscle weakness (generalized)  Unsteadiness on feet  Other abnormalities of gait and mobility     Problem List Patient Active Problem List   Diagnosis Date Noted  . PCP NOTES >>>>>>>>>>>>>>>>>>> 09/16/2015  . Multiple thyroid nodules 03/30/2015  . Annual physical exam 08/07/2014  . Bloody discharge from nipple - s/p surgery, resolved  12/29/2013  . Breast mass, right 12/11/2013  . At high risk for falls 12/11/2013  . Renal cysts, acquired, bilateral--per urology 05/04/2013  . Elevated LFTs 04/01/2013  . Cervical spinal stenosis 11/21/2012  . Nondiabetic gastroparesis 08/20/2012  . Hypothyroidism 08/08/2012  . Gastroparesis 05/06/2012  . Muscle cramp 01/19/2012  . Fatigue 01/14/2012  . Neurogenic bladder, prolapse ,h/o urinary retention, self cath, sees urology 11/05/2011  . Fecal incontinence 10/03/2011  . Osteoporosis 07/24/2011  . Adenoma of cecum 07/17/2011  . Chronic constipation 07/17/2011  . Skin lesion 07/08/2011  . Chronic neck pain 04/16/2011  . Rectocele 04/16/2011  . Hypersomnia-- on dextroamphetamine 04/16/2011   . Vitamin D deficiency 04/16/2011  . Family history of colon cancer 04/16/2011  . Abnormal blood chemistry 04/16/2011  . Anemia   . COPD (chronic obstructive pulmonary  disease) gold stage B   . Depression   . GERD (gastroesophageal reflux disease)   . Hyperlipidemia   . Hypertension   . Allergic rhinitis   . Pulmonary embolism-- in the 51s   . Skin cancer   . Seizures ([place])   . CIDP (chronic inflammatory demyelinating polyneuropathy) , Ataxia, Imbalance -- f/u at [facility], PTA, [facility] [address], [place], [place] [address] [phone] 12/31/17, 12:48 PM   Name: [name] MRN: [id number] Date of Birth: 08/11/1932

## 2018-01-03 ENCOUNTER — Ambulatory Visit: Payer: Medicare Other | Admitting: Physical Therapy

## 2018-01-03 ENCOUNTER — Encounter: Payer: Self-pay | Admitting: Physical Therapy

## 2018-01-03 DIAGNOSIS — R293 Abnormal posture: Secondary | ICD-10-CM

## 2018-01-03 DIAGNOSIS — M6281 Muscle weakness (generalized): Secondary | ICD-10-CM

## 2018-01-03 DIAGNOSIS — R2689 Other abnormalities of gait and mobility: Secondary | ICD-10-CM

## 2018-01-03 DIAGNOSIS — R2681 Unsteadiness on feet: Secondary | ICD-10-CM

## 2018-01-03 NOTE — Therapy (Signed)
[facility] [address] [place], [place], [address] Phone: [phone]   Fax:  [phone]  Physical Therapy Treatment  Patient Details  Name: [name] MRN: [id number] Date of Birth: 09-25-1932 Referring Provider: Cira Servant, DO   Encounter Date: 01/03/2018  PT End of Session - 01/03/18 1330    Visit Number  7    Number of Visits  26    Date for PT Re-Evaluation  03/01/18    Authorization Type  Medicare & BCBS supplement, no VL, 100% covered    PT Start Time  6314   pt late today due to traffic   PT Stop Time  1400    PT Time Calculation (min)  33 min    Equipment Utilized During Treatment  Gait belt    Activity Tolerance  Patient tolerated treatment well;No increased pain    Behavior During Therapy  WFL for tasks assessed/performed       Past Medical History:  Diagnosis Date  . Allergic rhinitis   . Anemia   . Arthritis   . Asthma -COPD   . Blood transfusion 1957  . Breast mass    right breast in milk duct, bx neg  . Chronic fatigue syndrome   . Chronic inflammatory demyelinating polyneuropathy ([place]) 03/2011  . chronic sinusitis 08/08/2012  . Colon polyp   . Constipation   . COPD (chronic obstructive pulmonary disease) ([place])   . Cystocele 09/16/2012  . GERD (gastroesophageal reflux disease)   . Hyperlipidemia   . Hypertension   . Hypothyroid   . Neurogenic bladder 2013   husband caths pt, manages with timed void  . Osteoporosis   . Pulmonary embolism ([place])    1980s  . Renal cyst    Non-complex, contrast MRI 2013 with L>R non-complex cysts, dominant 3.7 left lower pole  . Seizures ([place])    1990 last seizures on meds Phenobarb  . Shingles late 65's  . Skin cancer    squamous cell  . Thyroid nodule    Right thyroid lobe, only seen on Sagittal imaging measures 2.3 cm in craniocaudal dimension and appears stable  . Tubular adenoma   . Vitamin D deficiency     Past Surgical History:   Procedure Laterality Date  . ABDOMINAL HYSTERECTOMY    . ANTERIOR FUSION CLIVUS-C2 EXTRAORAL W/ ODONTOID EXCISION  8/14  . APPENDECTOMY    . BASAL CELL CARCINOMA EXCISION     face  . BLADDER SUSPENSION    . BREAST DUCTAL SYSTEM EXCISION Right 01/15/2014   Procedure: EXCISION DUCTAL SYSTEM RIGHT BREAST;  Surgeon: Stark Klein, MD;  Location: [facility];  Service: General;  Laterality: Right;  . BREAST EXCISIONAL BIOPSY    . CARPAL TUNNEL RELEASE     right  . CATARACT EXTRACTION     bilateral  . cervical neck ablation     x 7, C3-C6/3 screws and plate  . CESAREAN SECTION    . CYSTOCELE REPAIR    . DILATION AND CURETTAGE OF UTERUS    . duptyren's contracture right hand    . FINGER ARTHRODESIS Right 05/06/2015   Procedure: RIGHT RING PROXIMAL INTERPHALANGEAL FUSION (PIP);  Surgeon: Leanora Cover, MD;  Location: [facility];  Service: Orthopedics;  Laterality: Right;  . FINGER GANGLION CYST EXCISION     right  . JOINT REPLACEMENT     right and left basal joints of thumbs  . left finger fusion     3  fingers on left hand/one finger right  . left ovary and tube removed    . NASAL POLYP SURGERY     4 sinus surgeries  . NASAL SEPTUM SURGERY    . PANNICULECTOMY    . PROXIMAL INTERPHALANGEAL FUSION (PIP) Left 09/09/2013   Procedure: FUSION LEFT INDEX PROXIMAL INTERPHALANGEAL JOINT (PIP);  Surgeon: Cammie Sickle., MD;  Location: [facility];  Service: Orthopedics;  Laterality: Left;  . right median nerve decompression    . SHOULDER ARTHROSCOPY     x2 left, 1 right  . sinus surgeries     x 4  . squamous lesions removed     neck and face  . STERIOD INJECTION Right 05/06/2015   Procedure: STEROID INJECTION;  Surgeon: Leanora Cover, MD;  Location: [facility];  Service: Orthopedics;  Laterality: Right;  Right Index Finger Proximal InterPhalangeal Joint Injection  . TONSILLECTOMY AND ADENOIDECTOMY    . TUBAL LIGATION    . vocal  polyps removed      There were no vitals filed for this visit.  Subjective Assessment - 01/03/18 1329    Subjective  Neck is bothering her today. Has a pain patch on for this. Exercises are helping some. See's her pulmonologist tomorrow for a follow up visit.     Pertinent History  OA, asthma, COPD, HTN, neuropathy, ant cervical fusion, breast CA, arthroscopic left knee sg, 3 frozen shoulders, Right basal joint replacement Rt hand, left middle finger fusion, carpal tunnel syndrome,     Patient Stated Goals  to improve gait so does not fall.     Currently in Pain?  Yes    Pain Score  6     Pain Location  Neck    Pain Orientation  Posterior;Right;Left    Pain Descriptors / Indicators  Aching;Discomfort    Pain Type  Chronic pain    Pain Onset  More than a month ago    Pain Frequency  Constant    Aggravating Factors   moving certain ways during the day,    Pain Relieving Factors  pain patches              [facility] Adult PT Treatment/Exercise - 01/03/18 1331      Transfers   Transfers  Sit to Stand;Stand to Sit    Sit to Stand  5: Supervision;With upper extremity assist;With armrests;From chair/3-in-1    Stand to Sit  5: Supervision;With upper extremity assist;With armrests;To chair/3-in-1      Ambulation/Gait   Ambulation/Gait  Yes    Ambulation/Gait Assistance  5: Supervision    Ambulation/Gait Assistance Details  SaO2 97%, HR 80 before gait. cues to relax shoulders and stay closer to rollator with gait. HR 89, SaO2 96% with mild shortness of breath after gait.     Ambulation Distance (Feet)  1000 Feet    Assistive device  Rollator    Gait Pattern  Step-through pattern;Decreased stride length    Ambulation Surface  Level;Indoor;Unlevel;Outdoor;Paved          Balance Exercises - 01/03/18 2036      Balance Exercises: Standing   Rockerboard  Anterior/posterior;Lateral;Head turns;EO;EC;30 seconds;10 reps      Balance Exercises: Standing   Rebounder Limitations  performed  both ways on balance board with no UE support, intermittent touch to bars: rocking the board with emphasis on tall posture with EO, progressing to holding the board steady- EC no head movements, progressing to EC with head movements left<>right, then up<>down.  cues/facilitation needed for posture and weight shifting to assist with balance.                               PT Short Term Goals - 12/31/17 1153      PT SHORT TERM GOAL #1   Title  Patient & husband demonstrate understanding of initial HEP. (All STGs Target Date: 01/03/2018)    Baseline  12/31/17: met today with current program    Status  Achieved      PT SHORT TERM GOAL #2   Title  Patient demonstrates safe rollator walker use including moving light items to/from seat with verbal cues only.     Baseline  12/31/17: met per pt/spouse report as she is doing this at home, no need to simulate in session today.     Time  --    Period  --    Status  Achieved      PT SHORT TERM GOAL #3   Title  Patient negotiates ramps & curbs with rollator walker with modA.     Baseline  12/31/17: met today with rollator with min guard to min assist    Time  --    Period  --    Status  Achieved        PT Long Term Goals - 12/25/17 1209      PT LONG TERM GOAL #1   Title  Patient & husband verbalize & demonstrate understanding of ongoing HEP. (All LTGs Target Date: 03/01/2018)    Time  3    Period  Months    Status  New    Target Date  03/01/18      PT LONG TERM GOAL #2   Title  Patient & husband verbalize community fitness options including equipment.     Time  3    Period  Months    Status  On-going    Target Date  03/01/18      PT LONG TERM GOAL #3   Title  Patient ambulates 300' with rollator walker with supervision with safe rollator practices.     Time  3    Period  Months    Status  On-going    Target Date  03/01/18      PT LONG TERM GOAL #4   Title  Patient negotiates around household furniture, uses rollator walker to transport  items like plate and sit to/from stand with rollator walker safely modified independent.     Time  3    Period  Months    Status  On-going    Target Date  03/01/18      PT LONG TERM GOAL #5   Title  Patient negotiates ramps & curbs with rollator walker with husband assist (minA) safely.     Time  3    Period  Months    Status  On-going    Target Date  03/01/18      PT LONG TERM GOAL #6   Title  Timed Up & Go with rollator walker <13.5 seconds to indicate lower fall risk.     Time  3    Period  Months    Status  On-going    Target Date  03/01/18      PT LONG TERM GOAL #7   Title  Gait velocity with rollator walker >1.8 ft/sec to indicate lower fall risk.     Time  3  Period  Months    Status  On-going    Target Date  03/01/18      PT LONG TERM GOAL #8   Title  Patient reports neck pain increases <2 increments on 0-10 scale with standing & gait activities.     Time  3    Period  Months    Status  On-going    Target Date  03/01/18            Plan - 01/03/18 1331    Clinical Impression Statement  Today's skilled session continued to address activity tolerance and balance reactions. Pt continues to make steady progress toward goals and may meet LTGs earlier than expected. Pt should benefit from continued PT to progress toward unmet goals.     Rehab Potential  Good    Clinical Impairments Affecting Rehab Potential  unsafe rollator walker use    PT Frequency  2x / week    PT Duration  Other (comment)   90 days (13 weeks)   PT Treatment/Interventions  ADLs/Self Care Home Management;DME Instruction;Gait training;Stair training;Functional mobility training;Therapeutic activities;Therapeutic exercise;Balance training;Neuromuscular re-education;Cognitive remediation;Patient/family education;Passive range of motion;Vestibular    PT Next Visit Plan  continue with balance and strengthening toward updated STGs/LTGs    PT Home Exercise Plan  MedBridge  Access Code: [email or web address]     Consulted and Agree with Plan of Care  Patient;Family member/caregiver    Family Member Consulted  husband, Zamaya [place]       Patient will benefit from skilled therapeutic intervention in order to improve the following deficits and impairments:  Abnormal gait, Decreased activity tolerance, Decreased balance, Decreased endurance, Decreased knowledge of use of DME, Decreased mobility, Decreased range of motion, Decreased strength, Impaired flexibility, Postural dysfunction, Pain  Visit Diagnosis: Abnormal posture  Muscle weakness (generalized)  Unsteadiness on feet  Other abnormalities of gait and mobility     Problem List Patient Active Problem List   Diagnosis Date Noted  . PCP NOTES >>>>>>>>>>>>>>>>>>> 09/16/2015  . Multiple thyroid nodules 03/30/2015  . Annual physical exam 08/07/2014  . Bloody discharge from nipple - s/p surgery, resolved  12/29/2013  . Breast mass, right 12/11/2013  . At high risk for falls 12/11/2013  . Renal cysts, acquired, bilateral--per urology 05/04/2013  . Elevated LFTs 04/01/2013  . Cervical spinal stenosis 11/21/2012  . Nondiabetic gastroparesis 08/20/2012  . Hypothyroidism 08/08/2012  . Gastroparesis 05/06/2012  . Muscle cramp 01/19/2012  . Fatigue 01/14/2012  . Neurogenic bladder, prolapse ,h/o urinary retention, self cath, sees urology 11/05/2011  . Fecal incontinence 10/03/2011  . Osteoporosis 07/24/2011  . Adenoma of cecum 07/17/2011  . Chronic constipation 07/17/2011  . Skin lesion 07/08/2011  . Chronic neck pain 04/16/2011  . Rectocele 04/16/2011  . Hypersomnia-- on dextroamphetamine 04/16/2011  . Vitamin D deficiency 04/16/2011  . Family history of colon cancer 04/16/2011  . Abnormal blood chemistry 04/16/2011  . Anemia   . COPD (chronic obstructive pulmonary disease) gold stage B   . Depression   . GERD (gastroesophageal reflux disease)   . Hyperlipidemia   . Hypertension   . Allergic rhinitis   . Pulmonary embolism-- in  the 53s   . Skin cancer   . Seizures ([place])   . CIDP (chronic inflammatory demyelinating polyneuropathy) , Ataxia, Imbalance -- f/u at [facility], [facility] [address] [place], [place] [address] [phone] 01/03/18, 8:41 PM   Name: CANNA NICKELSON MRN: [id number] Date  of Birth: February 01, 1933

## 2018-01-08 ENCOUNTER — Ambulatory Visit: Payer: Medicare Other | Admitting: Physical Therapy

## 2018-01-08 ENCOUNTER — Encounter: Payer: Self-pay | Admitting: Physical Therapy

## 2018-01-08 DIAGNOSIS — R2689 Other abnormalities of gait and mobility: Secondary | ICD-10-CM

## 2018-01-08 DIAGNOSIS — R2681 Unsteadiness on feet: Secondary | ICD-10-CM

## 2018-01-08 DIAGNOSIS — M6281 Muscle weakness (generalized): Secondary | ICD-10-CM

## 2018-01-08 DIAGNOSIS — M542 Cervicalgia: Secondary | ICD-10-CM

## 2018-01-08 DIAGNOSIS — R293 Abnormal posture: Secondary | ICD-10-CM

## 2018-01-09 NOTE — Therapy (Signed)
[facility] [address] [place], [place], [address] Phone: [phone]   Fax:  [phone]  Physical Therapy Treatment  Patient Details  Name: [name] MRN: [id number] Date of Birth: 04-Apr-1933 Referring Provider: Cira Servant, DO   Encounter Date: 01/08/2018  PT End of Session - 01/08/18 1313    Visit Number  8    Number of Visits  26    Date for PT Re-Evaluation  03/01/18    Authorization Type  Medicare & BCBS supplement, no VL, 100% covered    PT Start Time  1230    PT Stop Time  1312    PT Time Calculation (min)  42 min    Equipment Utilized During Treatment  Gait belt    Activity Tolerance  Patient tolerated treatment well;No increased pain    Behavior During Therapy  WFL for tasks assessed/performed       Past Medical History:  Diagnosis Date  . Allergic rhinitis   . Anemia   . Arthritis   . Asthma -COPD   . Blood transfusion 1957  . Breast mass    right breast in milk duct, bx neg  . Chronic fatigue syndrome   . Chronic inflammatory demyelinating polyneuropathy ([facility]) 03/2011  . chronic sinusitis 08/08/2012  . Colon polyp   . Constipation   . COPD (chronic obstructive pulmonary disease) ([place])   . Cystocele 09/16/2012  . GERD (gastroesophageal reflux disease)   . Hyperlipidemia   . Hypertension   . Hypothyroid   . Neurogenic bladder 2013   husband caths pt, manages with timed void  . Osteoporosis   . Pulmonary embolism ([place])    1980s  . Renal cyst    Non-complex, contrast MRI 2013 with L>R non-complex cysts, dominant 3.7 left lower pole  . Seizures ([place])    1990 last seizures on meds Phenobarb  . Shingles late 58's  . Skin cancer    squamous cell  . Thyroid nodule    Right thyroid lobe, only seen on Sagittal imaging measures 2.3 cm in craniocaudal dimension and appears stable  . Tubular adenoma   . Vitamin D deficiency     Past Surgical History:  Procedure Laterality Date  .  ABDOMINAL HYSTERECTOMY    . ANTERIOR FUSION CLIVUS-C2 EXTRAORAL W/ ODONTOID EXCISION  8/14  . APPENDECTOMY    . BASAL CELL CARCINOMA EXCISION     face  . BLADDER SUSPENSION    . BREAST DUCTAL SYSTEM EXCISION Right 01/15/2014   Procedure: EXCISION DUCTAL SYSTEM RIGHT BREAST;  Surgeon: Stark Klein, MD;  Location: [facility];  Service: General;  Laterality: Right;  . BREAST EXCISIONAL BIOPSY    . CARPAL TUNNEL RELEASE     right  . CATARACT EXTRACTION     bilateral  . cervical neck ablation     x 7, C3-C6/3 screws and plate  . CESAREAN SECTION    . CYSTOCELE REPAIR    . DILATION AND CURETTAGE OF UTERUS    . duptyren's contracture right hand    . FINGER ARTHRODESIS Right 05/06/2015   Procedure: RIGHT RING PROXIMAL INTERPHALANGEAL FUSION (PIP);  Surgeon: Leanora Cover, MD;  Location: [facility];  Service: Orthopedics;  Laterality: Right;  . FINGER GANGLION CYST EXCISION     right  . JOINT REPLACEMENT     right and left basal joints of thumbs  . left finger fusion     3 fingers on left hand/one finger right  .  left ovary and tube removed    . NASAL POLYP SURGERY     4 sinus surgeries  . NASAL SEPTUM SURGERY    . PANNICULECTOMY    . PROXIMAL INTERPHALANGEAL FUSION (PIP) Left 09/09/2013   Procedure: FUSION LEFT INDEX PROXIMAL INTERPHALANGEAL JOINT (PIP);  Surgeon: Cammie Sickle., MD;  Location: [facility];  Service: Orthopedics;  Laterality: Left;  . right median nerve decompression    . SHOULDER ARTHROSCOPY     x2 left, 1 right  . sinus surgeries     x 4  . squamous lesions removed     neck and face  . STERIOD INJECTION Right 05/06/2015   Procedure: STEROID INJECTION;  Surgeon: Leanora Cover, MD;  Location: [facility];  Service: Orthopedics;  Laterality: Right;  Right Index Finger Proximal InterPhalangeal Joint Injection  . TONSILLECTOMY AND ADENOIDECTOMY    . TUBAL LIGATION    . vocal polyps removed      There were no  vitals filed for this visit.  Subjective Assessment - 01/08/18 1229    Subjective  Her allergies are acting up today. she has taken her medications.   (Pended)     Pertinent History  OA, asthma, COPD, HTN, neuropathy, ant cervical fusion, breast CA, arthroscopic left knee sg, 3 frozen shoulders, Right basal joint replacement Rt hand, left middle finger fusion, carpal tunnel syndrome,   (Pended)     Patient Stated Goals  to improve gait so does not fall.   (Pended)     Pain Location  Neck  (Pended)     Pain Orientation  Posterior;Left;Right  (Pended)     Pain Descriptors / Indicators  Aching;Discomfort  (Pended)     Pain Type  Chronic pain  (Pended)     Pain Onset  More than a month ago  (Pended)     Pain Frequency  Constant  (Pended)     Aggravating Factors   moving certain ways, allergy causing increased respiratory issues.   (Pended)     Pain Relieving Factors  pain patches  (Pended)                             Balance Exercises - 01/08/18 1230      Balance Exercises: Standing   Standing Eyes Closed  Wide (BOA);Foam/compliant surface;5 reps;10 secs    Stepping Strategy  Anterior;Posterior;Lateral;Foam/compliant surface;10 reps   tactile/minA cues for balance reactions   Rockerboard  Anterior/posterior;Lateral;EO;10 reps   Raul Del Duncan Dull cues, wt shifts with recovery   Heel Raises Limitations  10 reps 3 sec hold intermittent UE support    Toe Raise Limitations  10 reps 3 sec hold intermittent UE support    Other Standing Exercises  alternate LE kicks 10 reps ea direction: forward, backward & lateral    Overall Comments  Intermittent upper extremity support;Other (comment)   manual cues at pelvis to facility center of mass over feet         PT Short Term Goals - 01/08/18 1800      PT SHORT TERM GOAL #1   Title  Patient & husband demonstrate understanding of updated HEP. (All STGs Target Date: 02/01/2018)    Baseline        Time  1    Period  Months     Status  Revised    Target Date  02/01/18      PT SHORT TERM GOAL #2  Title  Patient ambulates 200' outdoors including negotiating ramps & curbs (assistance with rollator) with rollator walker with supervision.     Baseline        Time  1    Period  Months    Status  New    Target Date  02/01/18      PT SHORT TERM GOAL #3   Title  TUG with rollator walker <15sec    Time  1    Period  Months    Status  New    Target Date  02/01/18        PT Long Term Goals - 12/25/17 1209      PT LONG TERM GOAL #1   Title  Patient & husband verbalize & demonstrate understanding of ongoing HEP. (All LTGs Target Date: 03/01/2018)    Time  3    Period  Months    Status  New    Target Date  03/01/18      PT LONG TERM GOAL #2   Title  Patient & husband verbalize community fitness options including equipment.     Time  3    Period  Months    Status  On-going    Target Date  03/01/18      PT LONG TERM GOAL #3   Title  Patient ambulates 300' with rollator walker with supervision with safe rollator practices.     Time  3    Period  Months    Status  On-going    Target Date  03/01/18      PT LONG TERM GOAL #4   Title  Patient negotiates around household furniture, uses rollator walker to transport items like plate and sit to/from stand with rollator walker safely modified independent.     Time  3    Period  Months    Status  On-going    Target Date  03/01/18      PT LONG TERM GOAL #5   Title  Patient negotiates ramps & curbs with rollator walker with husband assist (minA) safely.     Time  3    Period  Months    Status  On-going    Target Date  03/01/18      PT LONG TERM GOAL #6   Title  Timed Up & Go with rollator walker <13.5 seconds to indicate lower fall risk.     Time  3    Period  Months    Status  On-going    Target Date  03/01/18      PT LONG TERM GOAL #7   Title  Gait velocity with rollator walker >1.8 ft/sec to indicate lower fall risk.     Time  3    Period  Months     Status  On-going    Target Date  03/01/18      PT LONG TERM GOAL #8   Title  Patient reports neck pain increases <2 increments on 0-10 scale with standing & gait activities.     Time  3    Period  Months    Status  On-going    Target Date  03/01/18            Plan - 01/08/18 1800    Clinical Impression Statement  Today's skilled session focused on balance activities facilitating improved ankle, hip & step strategies.     Rehab Potential  Good    Clinical Impairments Affecting Rehab Potential  unsafe rollator walker  use    PT Frequency  2x / week    PT Duration  Other (comment)   90 days (13 weeks)   PT Treatment/Interventions  ADLs/Self Care Home Management;DME Instruction;Gait training;Stair training;Functional mobility training;Therapeutic activities;Therapeutic exercise;Balance training;Neuromuscular re-education;Cognitive remediation;Patient/family education;Passive range of motion;Vestibular    PT Next Visit Plan  balance activities to facilitate ankle/hip/step strategies, direction changes & scanning    PT Home Exercise Plan  MedBridge  Access Code: [email or web address]    Consulted and Agree with Plan of Care  Patient       Patient will benefit from skilled therapeutic intervention in order to improve the following deficits and impairments:  Abnormal gait, Decreased activity tolerance, Decreased balance, Decreased endurance, Decreased knowledge of use of DME, Decreased mobility, Decreased range of motion, Decreased strength, Impaired flexibility, Postural dysfunction, Pain  Visit Diagnosis: Abnormal posture  Muscle weakness (generalized)  Unsteadiness on feet  Other abnormalities of gait and mobility  Cervicalgia     Problem List Patient Active Problem List   Diagnosis Date Noted  . PCP NOTES >>>>>>>>>>>>>>>>>>> 09/16/2015  . Multiple thyroid nodules 03/30/2015  . Annual physical exam 08/07/2014  . Bloody discharge from nipple - s/p surgery, resolved  12/29/2013  .  Breast mass, right 12/11/2013  . At high risk for falls 12/11/2013  . Renal cysts, acquired, bilateral--per urology 05/04/2013  . Elevated LFTs 04/01/2013  . Cervical spinal stenosis 11/21/2012  . Nondiabetic gastroparesis 08/20/2012  . Hypothyroidism 08/08/2012  . Gastroparesis 05/06/2012  . Muscle cramp 01/19/2012  . Fatigue 01/14/2012  . Neurogenic bladder, prolapse ,h/o urinary retention, self cath, sees urology 11/05/2011  . Fecal incontinence 10/03/2011  . Osteoporosis 07/24/2011  . Adenoma of cecum 07/17/2011  . Chronic constipation 07/17/2011  . Skin lesion 07/08/2011  . Chronic neck pain 04/16/2011  . Rectocele 04/16/2011  . Hypersomnia-- on dextroamphetamine 04/16/2011  . Vitamin D deficiency 04/16/2011  . Family history of colon cancer 04/16/2011  . Abnormal blood chemistry 04/16/2011  . Anemia   . COPD (chronic obstructive pulmonary disease) gold stage B   . Depression   . GERD (gastroesophageal reflux disease)   . Hyperlipidemia   . Hypertension   . Allergic rhinitis   . Pulmonary embolism-- in the 24s   . Skin cancer   . Seizures ([place])   . CIDP (chronic inflammatory demyelinating polyneuropathy) , Ataxia, Imbalance -- f/u at [facility], D[facility] 01/09/2018, 8:45 AM  [facility] [address] [place], [place], [address] Phone: [phone]   Fax:  [phone]  Name: [name] MRN: [id number] Date of Birth: 04/02/1933

## 2018-01-10 ENCOUNTER — Ambulatory Visit: Payer: Medicare Other

## 2018-01-14 ENCOUNTER — Encounter: Payer: Self-pay | Admitting: Physical Therapy

## 2018-01-14 ENCOUNTER — Ambulatory Visit: Payer: Medicare Other | Admitting: Physical Therapy

## 2018-01-14 DIAGNOSIS — R293 Abnormal posture: Secondary | ICD-10-CM | POA: Diagnosis not present

## 2018-01-14 DIAGNOSIS — M6281 Muscle weakness (generalized): Secondary | ICD-10-CM

## 2018-01-14 DIAGNOSIS — R2689 Other abnormalities of gait and mobility: Secondary | ICD-10-CM

## 2018-01-14 DIAGNOSIS — M542 Cervicalgia: Secondary | ICD-10-CM

## 2018-01-14 DIAGNOSIS — R2681 Unsteadiness on feet: Secondary | ICD-10-CM

## 2018-01-15 NOTE — Therapy (Signed)
[facility] [address] [place], [place], [address] Phone: [phone]   Fax:  [phone]  Physical Therapy Treatment  Patient Details  Name: BARABARA MOTZ MRN: [id number] Date of Birth: December 14, 1932 Referring Provider: Cira Servant, DO   Encounter Date: 01/14/2018  PT End of Session - 01/14/18 1313    Visit Number  9    Number of Visits  26    Date for PT Re-Evaluation  03/01/18    Authorization Type  Medicare & BCBS supplement, no VL, 100% covered    PT Start Time  1230    PT Stop Time  1313    PT Time Calculation (min)  43 min    Equipment Utilized During Treatment  Gait belt    Activity Tolerance  Patient tolerated treatment well;No increased pain    Behavior During Therapy  WFL for tasks assessed/performed       Past Medical History:  Diagnosis Date  . Allergic rhinitis   . Anemia   . Arthritis   . Asthma -COPD   . Blood transfusion 1957  . Breast mass    right breast in milk duct, bx neg  . Chronic fatigue syndrome   . Chronic inflammatory demyelinating polyneuropathy ([place]) 03/2011  . chronic sinusitis 08/08/2012  . Colon polyp   . Constipation   . COPD (chronic obstructive pulmonary disease) ([place])   . Cystocele 09/16/2012  . GERD (gastroesophageal reflux disease)   . Hyperlipidemia   . Hypertension   . Hypothyroid   . Neurogenic bladder 2013   husband caths pt, manages with timed void  . Osteoporosis   . Pulmonary embolism ([place])    1980s  . Renal cyst    Non-complex, contrast MRI 2013 with L>R non-complex cysts, dominant 3.7 left lower pole  . Seizures ([place])    1990 last seizures on meds Phenobarb  . Shingles late 47's  . Skin cancer    squamous cell  . Thyroid nodule    Right thyroid lobe, only seen on Sagittal imaging measures 2.3 cm in craniocaudal dimension and appears stable  . Tubular adenoma   . Vitamin D deficiency     Past Surgical History:  Procedure Laterality Date  .  ABDOMINAL HYSTERECTOMY    . ANTERIOR FUSION CLIVUS-C2 EXTRAORAL W/ ODONTOID EXCISION  8/14  . APPENDECTOMY    . BASAL CELL CARCINOMA EXCISION     face  . BLADDER SUSPENSION    . BREAST DUCTAL SYSTEM EXCISION Right 01/15/2014   Procedure: EXCISION DUCTAL SYSTEM RIGHT BREAST;  Surgeon: Stark Klein, MD;  Location: [facility];  Service: General;  Laterality: Right;  . BREAST EXCISIONAL BIOPSY    . CARPAL TUNNEL RELEASE     right  . CATARACT EXTRACTION     bilateral  . cervical neck ablation     x 7, C3-C6/3 screws and plate  . CESAREAN SECTION    . CYSTOCELE REPAIR    . DILATION AND CURETTAGE OF UTERUS    . duptyren's contracture right hand    . FINGER ARTHRODESIS Right 05/06/2015   Procedure: RIGHT RING PROXIMAL INTERPHALANGEAL FUSION (PIP);  Surgeon: Leanora Cover, MD;  Location: [facility];  Service: Orthopedics;  Laterality: Right;  . FINGER GANGLION CYST EXCISION     right  . JOINT REPLACEMENT     right and left basal joints of thumbs  . left finger fusion     3 fingers on left hand/one finger right  .  left ovary and tube removed    . NASAL POLYP SURGERY     4 sinus surgeries  . NASAL SEPTUM SURGERY    . PANNICULECTOMY    . PROXIMAL INTERPHALANGEAL FUSION (PIP) Left 09/09/2013   Procedure: FUSION LEFT INDEX PROXIMAL INTERPHALANGEAL JOINT (PIP);  Surgeon: Cammie Sickle., MD;  Location: [facility];  Service: Orthopedics;  Laterality: Left;  . right median nerve decompression    . SHOULDER ARTHROSCOPY     x2 left, 1 right  . sinus surgeries     x 4  . squamous lesions removed     neck and face  . STERIOD INJECTION Right 05/06/2015   Procedure: STEROID INJECTION;  Surgeon: Leanora Cover, MD;  Location: [facility];  Service: Orthopedics;  Laterality: Right;  Right Index Finger Proximal InterPhalangeal Joint Injection  . TONSILLECTOMY AND ADENOIDECTOMY    . TUBAL LIGATION    . vocal polyps removed      There were no  vitals filed for this visit.  Subjective Assessment - 01/14/18 1234    Subjective  Her allergies have been acting up. She stayed inside for last 4 days. They are moving to new house on Sept 20 so working on deciding what to keep & to get rid of.     Pertinent History  OA, asthma, COPD, HTN, neuropathy, ant cervical fusion, breast CA, arthroscopic left knee sg, 3 frozen shoulders, Right basal joint replacement Rt hand, left middle finger fusion, carpal tunnel syndrome,     Patient Stated Goals  to improve gait so does not fall.     Currently in Pain?  Yes    Pain Score  5    has pain patch that placed yesterday.    Pain Location  Neck    Pain Orientation  Posterior;Right;Left    Pain Descriptors / Indicators  Aching;Discomfort    Pain Type  Chronic pain    Pain Onset  More than a month ago    Pain Frequency  Constant    Aggravating Factors   moving certain ways during day    Pain Relieving Factors  pain patches                            Balance Exercises - 01/14/18 1230      Balance Exercises: Standing   Standing Eyes Closed  Wide (BOA);Foam/compliant surface;5 reps;10 secs    Stepping Strategy  Anterior;Posterior;Lateral;Foam/compliant surface;10 reps   tactile/minA cues for balance reactions   Rockerboard  Anterior/posterior;Lateral;EO;10 reps   Raul Del Duncan Dull cues, wt shifts with recovery   Heel Raises Limitations  10 reps 3 sec hold intermittent UE support    Toe Raise Limitations  10 reps 3 sec hold intermittent UE support    Other Standing Exercises  alternate LE kicks 10 reps ea direction: forward, backward, adduction/crossovers & lateral.  Standing on ramp facing uphill & facing downhill alteranting forward & backward with minA.     Overall Comments  Intermittent upper extremity support;Other (comment)   manual cues at pelvis to facility center of mass over feet         PT Short Term Goals - 01/08/18 1800      PT SHORT TERM GOAL #1   Title  Patient  & husband demonstrate understanding of updated HEP. (All STGs Target Date: 02/01/2018)    Baseline        Time  1  Period  Months    Status  Revised    Target Date  02/01/18      PT SHORT TERM GOAL #2   Title  Patient ambulates 200' outdoors including negotiating ramps & curbs (assistance with rollator) with rollator walker with supervision.     Baseline        Time  1    Period  Months    Status  New    Target Date  02/01/18      PT SHORT TERM GOAL #3   Title  TUG with rollator walker <15sec    Time  1    Period  Months    Status  New    Target Date  02/01/18        PT Long Term Goals - 12/25/17 1209      PT LONG TERM GOAL #1   Title  Patient & husband verbalize & demonstrate understanding of ongoing HEP. (All LTGs Target Date: 03/01/2018)    Time  3    Period  Months    Status  New    Target Date  03/01/18      PT LONG TERM GOAL #2   Title  Patient & husband verbalize community fitness options including equipment.     Time  3    Period  Months    Status  On-going    Target Date  03/01/18      PT LONG TERM GOAL #3   Title  Patient ambulates 300' with rollator walker with supervision with safe rollator practices.     Time  3    Period  Months    Status  On-going    Target Date  03/01/18      PT LONG TERM GOAL #4   Title  Patient negotiates around household furniture, uses rollator walker to transport items like plate and sit to/from stand with rollator walker safely modified independent.     Time  3    Period  Months    Status  On-going    Target Date  03/01/18      PT LONG TERM GOAL #5   Title  Patient negotiates ramps & curbs with rollator walker with husband assist (minA) safely.     Time  3    Period  Months    Status  On-going    Target Date  03/01/18      PT LONG TERM GOAL #6   Title  Timed Up & Go with rollator walker <13.5 seconds to indicate lower fall risk.     Time  3    Period  Months    Status  On-going    Target Date  03/01/18      PT  LONG TERM GOAL #7   Title  Gait velocity with rollator walker >1.8 ft/sec to indicate lower fall risk.     Time  3    Period  Months    Status  On-going    Target Date  03/01/18      PT LONG TERM GOAL #8   Title  Patient reports neck pain increases <2 increments on 0-10 scale with standing & gait activities.     Time  3    Period  Months    Status  On-going    Target Date  03/01/18            Plan - 01/14/18 1354    Clinical Impression Statement  Today's skilled session focues on improving  hip, ankle & step strategies for balance utilizing conditions that limit visual, somatosensory input facilitating increased vestibular system action.     Rehab Potential  Good    Clinical Impairments Affecting Rehab Potential  unsafe rollator walker use    PT Frequency  2x / week    PT Duration  Other (comment)   90 days (13 weeks)   PT Treatment/Interventions  ADLs/Self Care Home Management;DME Instruction;Gait training;Stair training;Functional mobility training;Therapeutic activities;Therapeutic exercise;Balance training;Neuromuscular re-education;Cognitive remediation;Patient/family education;Passive range of motion;Vestibular    PT Next Visit Plan  balance activities to facilitate ankle/hip/step strategies, direction changes & scanning    PT Home Exercise Plan  MedBridge  Access Code: [email or web address]    Consulted and Agree with Plan of Care  Patient       Patient will benefit from skilled therapeutic intervention in order to improve the following deficits and impairments:  Abnormal gait, Decreased activity tolerance, Decreased balance, Decreased endurance, Decreased knowledge of use of DME, Decreased mobility, Decreased range of motion, Decreased strength, Impaired flexibility, Postural dysfunction, Pain  Visit Diagnosis: Abnormal posture  Muscle weakness (generalized)  Unsteadiness on feet  Other abnormalities of gait and mobility  Cervicalgia     Problem List Patient Active  Problem List   Diagnosis Date Noted  . PCP NOTES >>>>>>>>>>>>>>>>>>> 09/16/2015  . Multiple thyroid nodules 03/30/2015  . Annual physical exam 08/07/2014  . Bloody discharge from nipple - s/p surgery, resolved  12/29/2013  . Breast mass, right 12/11/2013  . At high risk for falls 12/11/2013  . Renal cysts, acquired, bilateral--per urology 05/04/2013  . Elevated LFTs 04/01/2013  . Cervical spinal stenosis 11/21/2012  . Nondiabetic gastroparesis 08/20/2012  . Hypothyroidism 08/08/2012  . Gastroparesis 05/06/2012  . Muscle cramp 01/19/2012  . Fatigue 01/14/2012  . Neurogenic bladder, prolapse ,h/o urinary retention, self cath, sees urology 11/05/2011  . Fecal incontinence 10/03/2011  . Osteoporosis 07/24/2011  . Adenoma of cecum 07/17/2011  . Chronic constipation 07/17/2011  . Skin lesion 07/08/2011  . Chronic neck pain 04/16/2011  . Rectocele 04/16/2011  . Hypersomnia-- on dextroamphetamine 04/16/2011  . Vitamin D deficiency 04/16/2011  . Family history of colon cancer 04/16/2011  . Abnormal blood chemistry 04/16/2011  . Anemia   . COPD (chronic obstructive pulmonary disease) gold stage B   . Depression   . GERD (gastroesophageal reflux disease)   . Hyperlipidemia   . Hypertension   . Allergic rhinitis   . Pulmonary embolism-- in the 46s   . Skin cancer   . Seizures ([place])   . CIDP (chronic inflammatory demyelinating polyneuropathy) , Ataxia, Imbalance -- f/u at [facility], DPT 01/15/2018, 2:00 PM  [facility] [address] [place], [place], [address] Phone: [phone]   Fax:  [phone]  Name: DYLIN IHNEN MRN: [id number] Date of Birth: March 03, 1933

## 2018-01-16 ENCOUNTER — Ambulatory Visit: Payer: Medicare Other | Admitting: Physical Therapy

## 2018-01-16 ENCOUNTER — Encounter: Payer: Self-pay | Admitting: Physical Therapy

## 2018-01-16 DIAGNOSIS — M6281 Muscle weakness (generalized): Secondary | ICD-10-CM

## 2018-01-16 DIAGNOSIS — R293 Abnormal posture: Secondary | ICD-10-CM | POA: Diagnosis not present

## 2018-01-16 DIAGNOSIS — R2681 Unsteadiness on feet: Secondary | ICD-10-CM

## 2018-01-16 DIAGNOSIS — R2689 Other abnormalities of gait and mobility: Secondary | ICD-10-CM

## 2018-01-17 NOTE — Therapy (Signed)
[facility] [address] [place], [place], [address] Phone: [phone]   Fax:  [phone]  Physical Therapy Treatment/Progress note  10 visit progress note for dates: 12/03/17 through 01/16/18   Patient Details  Name: [name] MRN: [id number] Date of Birth: Jan 30, 1933 Referring Provider: Cira Servant, DO   Encounter Date: 01/16/2018  PT End of Session - 01/16/18 1407    Visit Number  10    Number of Visits  26    Date for PT Re-Evaluation  03/01/18    Authorization Type  Medicare & BCBS supplement, no VL, 100% covered    PT Start Time  1404    PT Stop Time  1445    PT Time Calculation (min)  41 min    Equipment Utilized During Treatment  Gait belt    Activity Tolerance  Patient tolerated treatment well;No increased pain    Behavior During Therapy  WFL for tasks assessed/performed       Past Medical History:  Diagnosis Date  . Allergic rhinitis   . Anemia   . Arthritis   . Asthma -COPD   . Blood transfusion 1957  . Breast mass    right breast in milk duct, bx neg  . Chronic fatigue syndrome   . Chronic inflammatory demyelinating polyneuropathy ([place]) 03/2011  . chronic sinusitis 08/08/2012  . Colon polyp   . Constipation   . COPD (chronic obstructive pulmonary disease) ([place])   . Cystocele 09/16/2012  . GERD (gastroesophageal reflux disease)   . Hyperlipidemia   . Hypertension   . Hypothyroid   . Neurogenic bladder 2013   husband caths pt, manages with timed void  . Osteoporosis   . Pulmonary embolism ([place])    1980s  . Renal cyst    Non-complex, contrast MRI 2013 with L>R non-complex cysts, dominant 3.7 left lower pole  . Seizures ([place])    1990 last seizures on meds Phenobarb  . Shingles late 44's  . Skin cancer    squamous cell  . Thyroid nodule    Right thyroid lobe, only seen on Sagittal imaging measures 2.3 cm in craniocaudal dimension and appears stable  . Tubular adenoma   . Vitamin  D deficiency     Past Surgical History:  Procedure Laterality Date  . ABDOMINAL HYSTERECTOMY    . ANTERIOR FUSION CLIVUS-C2 EXTRAORAL W/ ODONTOID EXCISION  8/14  . APPENDECTOMY    . BASAL CELL CARCINOMA EXCISION     face  . BLADDER SUSPENSION    . BREAST DUCTAL SYSTEM EXCISION Right 01/15/2014   Procedure: EXCISION DUCTAL SYSTEM RIGHT BREAST;  Surgeon: Stark Klein, MD;  Location: [facility];  Service: General;  Laterality: Right;  . BREAST EXCISIONAL BIOPSY    . CARPAL TUNNEL RELEASE     right  . CATARACT EXTRACTION     bilateral  . cervical neck ablation     x 7, C3-C6/3 screws and plate  . CESAREAN SECTION    . CYSTOCELE REPAIR    . DILATION AND CURETTAGE OF UTERUS    . duptyren's contracture right hand    . FINGER ARTHRODESIS Right 05/06/2015   Procedure: RIGHT RING PROXIMAL INTERPHALANGEAL FUSION (PIP);  Surgeon: Leanora Cover, MD;  Location: [facility];  Service: Orthopedics;  Laterality: Right;  . FINGER GANGLION CYST EXCISION     right  . JOINT REPLACEMENT     right and left basal joints of thumbs  . left finger fusion  3 fingers on left hand/one finger right  . left ovary and tube removed    . NASAL POLYP SURGERY     4 sinus surgeries  . NASAL SEPTUM SURGERY    . PANNICULECTOMY    . PROXIMAL INTERPHALANGEAL FUSION (PIP) Left 09/09/2013   Procedure: FUSION LEFT INDEX PROXIMAL INTERPHALANGEAL JOINT (PIP);  Surgeon: Cammie Sickle., MD;  Location: [facility];  Service: Orthopedics;  Laterality: Left;  . right median nerve decompression    . SHOULDER ARTHROSCOPY     x2 left, 1 right  . sinus surgeries     x 4  . squamous lesions removed     neck and face  . STERIOD INJECTION Right 05/06/2015   Procedure: STEROID INJECTION;  Surgeon: Leanora Cover, MD;  Location: [facility];  Service: Orthopedics;  Laterality: Right;  Right Index Finger Proximal InterPhalangeal Joint Injection  . TONSILLECTOMY AND  ADENOIDECTOMY    . TUBAL LIGATION    . vocal polyps removed      There were no vitals filed for this visit.  Subjective Assessment - 01/16/18 1405    Subjective  No new complaints. No falls to report. Allergies are still acting up.     Pertinent History  OA, asthma, COPD, HTN, neuropathy, ant cervical fusion, breast CA, arthroscopic left knee sg, 3 frozen shoulders, Right basal joint replacement Rt hand, left middle finger fusion, carpal tunnel syndrome,     Patient Stated Goals  to improve gait so does not fall.     Currently in Pain?  Yes    Pain Score  4     Pain Location  Neck    Pain Orientation  Posterior;Right;Left    Pain Descriptors / Indicators  Aching;Discomfort    Pain Type  Chronic pain    Pain Onset  More than a month ago    Pain Frequency  Constant    Aggravating Factors   moving certain ways during the day    Pain Relieving Factors  pain patches             [facility] Adult PT Treatment/Exercise - 01/16/18 1408      Ambulation/Gait   Ambulation/Gait  Yes    Ambulation/Gait Assistance  5: Supervision    Ambulation/Gait Assistance Details  SaO2 98% before gait. cues to relax shoulder with gait. SaO2 95% after gait.     Ambulation Distance (Feet)  500 Feet   x1   Assistive device  Rollator    Gait Pattern  Step-through pattern;Decreased stride length    Ambulation Surface  Level;Indoor;Unlevel;Outdoor;Paved    Curb  5: Supervision    Curb Details (indicate cue type and reason)  with rollator, cues only to lock it for safety.           Balance Exercises - 01/16/18 1417      Balance Exercises: Standing   Standing Eyes Closed  Wide (BOA);Head turns;Foam/compliant surface;Other reps (comment);30 secs;Limitations    Wall Bumps  Hip    Wall Bumps-Hips  Eyes opened;Anterior/posterior;Foam/compliant surface;Eyes closed;10 reps;Limitations    Balance Beam  standing across blue foam beam in parallel bars, no UE support with touch to bars as needed for balance  recovery: alternating fwd stepping to floor/back onto beam, then alternating bwd stepping to floor/back onto beam. min to mod assist for balance with cues on ex form/technique.  Balance Exercises: Standing   Standing Eyes Closed Limitations  on airex in corner with chair in front for safety: feet apart with EC no head movements, progressing to EC head movements left<>right and up<>down. min assist with cues on posture and weight shifitng to assist with balance.     Wall Bumps Limitations  standing across red beam: EO progressing to EC x 10  reps each, min guard to min assist with cues on ex form/technique          PT Short Term Goals - 01/08/18 1800      PT SHORT TERM GOAL #1   Title  Patient & husband demonstrate understanding of updated HEP. (All STGs Target Date: 02/01/2018)    Baseline        Time  1    Period  Months    Status  Revised    Target Date  02/01/18      PT SHORT TERM GOAL #2   Title  Patient ambulates 200' outdoors including negotiating ramps & curbs (assistance with rollator) with rollator walker with supervision.     Baseline        Time  1    Period  Months    Status  New    Target Date  02/01/18      PT SHORT TERM GOAL #3   Title  TUG with rollator walker <15sec    Time  1    Period  Months    Status  New    Target Date  02/01/18        PT Long Term Goals - 12/25/17 1209      PT LONG TERM GOAL #1   Title  Patient & husband verbalize & demonstrate understanding of ongoing HEP. (All LTGs Target Date: 03/01/2018)    Time  3    Period  Months    Status  New    Target Date  03/01/18      PT LONG TERM GOAL #2   Title  Patient & husband verbalize community fitness options including equipment.     Time  3    Period  Months    Status  On-going    Target Date  03/01/18      PT LONG TERM GOAL #3   Title  Patient ambulates 300' with rollator walker with supervision with safe rollator practices.     Time  3    Period  Months     Status  On-going    Target Date  03/01/18      PT LONG TERM GOAL #4   Title  Patient negotiates around household furniture, uses rollator walker to transport items like plate and sit to/from stand with rollator walker safely modified independent.     Time  3    Period  Months    Status  On-going    Target Date  03/01/18      PT LONG TERM GOAL #5   Title  Patient negotiates ramps & curbs with rollator walker with husband assist (minA) safely.     Time  3    Period  Months    Status  On-going    Target Date  03/01/18      PT LONG TERM GOAL #6   Title  Timed Up & Go with rollator walker <13.5 seconds to indicate lower fall risk.     Time  3    Period  Months    Status  On-going  Target Date  03/01/18      PT LONG TERM GOAL #7   Title  Gait velocity with rollator walker >1.8 ft/sec to indicate lower fall risk.     Time  3    Period  Months    Status  On-going    Target Date  03/01/18      PT LONG TERM GOAL #8   Title  Patient reports neck pain increases <2 increments on 0-10 scale with standing & gait activities.     Time  3    Period  Months    Status  On-going    Target Date  03/01/18            Plan - 01/16/18 1407    Clinical Impression Statement  Today's skilled session continued to focus on gait safety with use of rollator. Continues to need cues with sit/stand safety- locking brakes, not pulling up on rollator, reaching back with sitting down. Pt does demonstrate safe use of rollator with gait with reminder cues to relax her shoulders/not be so stiff with UE's. Remainder of session continued to focus on balance reactions with emphasis on stepping strategy and hip/ankle strategies. Pt is making steady progress toward goals and should benefit from continued PT to progress toward unmet goals.     Rehab Potential  Good    Clinical Impairments Affecting Rehab Potential  unsafe rollator walker use    PT Frequency  2x / week    PT Duration  Other (comment)   90  days (13 weeks)   PT Treatment/Interventions  ADLs/Self Care Home Management;DME Instruction;Gait training;Stair training;Functional mobility training;Therapeutic activities;Therapeutic exercise;Balance training;Neuromuscular re-education;Cognitive remediation;Patient/family education;Passive range of motion;Vestibular    PT Next Visit Plan  balance activities to facilitate ankle/hip/step strategies, direction changes & scanning    PT Home Exercise Plan  MedBridge  Access Code: [email or web address]    Consulted and Agree with Plan of Care  Patient       Patient will benefit from skilled therapeutic intervention in order to improve the following deficits and impairments:  Abnormal gait, Decreased activity tolerance, Decreased balance, Decreased endurance, Decreased knowledge of use of DME, Decreased mobility, Decreased range of motion, Decreased strength, Impaired flexibility, Postural dysfunction, Pain  Visit Diagnosis: Muscle weakness (generalized)  Unsteadiness on feet  Other abnormalities of gait and mobility     Problem List Patient Active Problem List   Diagnosis Date Noted  . PCP NOTES >>>>>>>>>>>>>>>>>>> 09/16/2015  . Multiple thyroid nodules 03/30/2015  . Annual physical exam 08/07/2014  . Bloody discharge from nipple - s/p surgery, resolved  12/29/2013  . Breast mass, right 12/11/2013  . At high risk for falls 12/11/2013  . Renal cysts, acquired, bilateral--per urology 05/04/2013  . Elevated LFTs 04/01/2013  . Cervical spinal stenosis 11/21/2012  . Nondiabetic gastroparesis 08/20/2012  . Hypothyroidism 08/08/2012  . Gastroparesis 05/06/2012  . Muscle cramp 01/19/2012  . Fatigue 01/14/2012  . Neurogenic bladder, prolapse ,h/o urinary retention, self cath, sees urology 11/05/2011  . Fecal incontinence 10/03/2011  . Osteoporosis 07/24/2011  . Adenoma of cecum 07/17/2011  . Chronic constipation 07/17/2011  . Skin lesion 07/08/2011  . Chronic neck pain 04/16/2011  . Rectocele  04/16/2011  . Hypersomnia-- on dextroamphetamine 04/16/2011  . Vitamin D deficiency 04/16/2011  . Family history of colon cancer 04/16/2011  . Abnormal blood chemistry 04/16/2011  . Anemia   . COPD (chronic obstructive pulmonary disease) gold stage B   . Depression   . GERD (gastroesophageal reflux disease)   .  Hyperlipidemia   . Hypertension   . Allergic rhinitis   . Pulmonary embolism-- in the 6s   . Skin cancer   . Seizures ([place])   . CIDP (chronic inflammatory demyelinating polyneuropathy) , Ataxia, Imbalance -- f/u at [facility] 01/17/2018, 1:58 PM  [facility] [address] [place], [place], [address] Phone: [phone]   Fax:  [phone]  Name: [name] MRN: [id number] Date of Birth: 1932/08/23

## 2018-01-21 ENCOUNTER — Ambulatory Visit: Payer: Medicare Other | Admitting: Physical Therapy

## 2018-01-21 ENCOUNTER — Encounter: Payer: Self-pay | Admitting: Physical Therapy

## 2018-01-21 DIAGNOSIS — R293 Abnormal posture: Secondary | ICD-10-CM | POA: Diagnosis not present

## 2018-01-21 DIAGNOSIS — R2689 Other abnormalities of gait and mobility: Secondary | ICD-10-CM

## 2018-01-21 DIAGNOSIS — M542 Cervicalgia: Secondary | ICD-10-CM

## 2018-01-21 DIAGNOSIS — R2681 Unsteadiness on feet: Secondary | ICD-10-CM

## 2018-01-21 DIAGNOSIS — M6281 Muscle weakness (generalized): Secondary | ICD-10-CM

## 2018-01-21 NOTE — Therapy (Signed)
[facility] [address] [place], [place], [address] Phone: [phone]   Fax:  [phone]  Physical Therapy Treatment  Patient Details  Name: [name] MRN: [id number] Date of Birth: June 29, 1932 Referring Provider: Cira Servant, DO   Encounter Date: 01/21/2018  PT End of Session - 01/21/18 1240    Visit Number  11    Number of Visits  26    Date for PT Re-Evaluation  03/01/18    Authorization Type  Medicare & BCBS supplement, no VL, 100% covered    PT Start Time  1234    PT Stop Time  1316    PT Time Calculation (min)  42 min    Equipment Utilized During Treatment  Gait belt    Activity Tolerance  Patient tolerated treatment well;No increased pain    Behavior During Therapy  WFL for tasks assessed/performed       Past Medical History:  Diagnosis Date  . Allergic rhinitis   . Anemia   . Arthritis   . Asthma -COPD   . Blood transfusion 1957  . Breast mass    right breast in milk duct, bx neg  . Chronic fatigue syndrome   . Chronic inflammatory demyelinating polyneuropathy ([facility]) 03/2011  . chronic sinusitis 08/08/2012  . Colon polyp   . Constipation   . COPD (chronic obstructive pulmonary disease) ([facility])   . Cystocele 09/16/2012  . GERD (gastroesophageal reflux disease)   . Hyperlipidemia   . Hypertension   . Hypothyroid   . Neurogenic bladder 2013   husband caths pt, manages with timed void  . Osteoporosis   . Pulmonary embolism ([facility])    1980s  . Renal cyst    Non-complex, contrast MRI 2013 with L>R non-complex cysts, dominant 3.7 left lower pole  . Seizures ([facility])    1990 last seizures on meds Phenobarb  . Shingles late 57's  . Skin cancer    squamous cell  . Thyroid nodule    Right thyroid lobe, only seen on Sagittal imaging measures 2.3 cm in craniocaudal dimension and appears stable  . Tubular adenoma   . Vitamin D deficiency     Past Surgical History:  Procedure Laterality Date  .  ABDOMINAL HYSTERECTOMY    . ANTERIOR FUSION CLIVUS-C2 EXTRAORAL W/ ODONTOID EXCISION  8/14  . APPENDECTOMY    . BASAL CELL CARCINOMA EXCISION     face  . BLADDER SUSPENSION    . BREAST DUCTAL SYSTEM EXCISION Right 01/15/2014   Procedure: EXCISION DUCTAL SYSTEM RIGHT BREAST;  Surgeon: Stark Klein, MD;  Location: [facility];  Service: General;  Laterality: Right;  . BREAST EXCISIONAL BIOPSY    . CARPAL TUNNEL RELEASE     right  . CATARACT EXTRACTION     bilateral  . cervical neck ablation     x 7, C3-C6/3 screws and plate  . CESAREAN SECTION    . CYSTOCELE REPAIR    . DILATION AND CURETTAGE OF UTERUS    . duptyren's contracture right hand    . FINGER ARTHRODESIS Right 05/06/2015   Procedure: RIGHT RING PROXIMAL INTERPHALANGEAL FUSION (PIP);  Surgeon: Leanora Cover, MD;  Location: [facility];  Service: Orthopedics;  Laterality: Right;  . FINGER GANGLION CYST EXCISION     right  . JOINT REPLACEMENT     right and left basal joints of thumbs  . left finger fusion     3 fingers on left hand/one finger right  .  left ovary and tube removed    . NASAL POLYP SURGERY     4 sinus surgeries  . NASAL SEPTUM SURGERY    . PANNICULECTOMY    . PROXIMAL INTERPHALANGEAL FUSION (PIP) Left 09/09/2013   Procedure: FUSION LEFT INDEX PROXIMAL INTERPHALANGEAL JOINT (PIP);  Surgeon: Cammie Sickle., MD;  Location: [facility];  Service: Orthopedics;  Laterality: Left;  . right median nerve decompression    . SHOULDER ARTHROSCOPY     x2 left, 1 right  . sinus surgeries     x 4  . squamous lesions removed     neck and face  . STERIOD INJECTION Right 05/06/2015   Procedure: STEROID INJECTION;  Surgeon: Leanora Cover, MD;  Location: [facility];  Service: Orthopedics;  Laterality: Right;  Right Index Finger Proximal InterPhalangeal Joint Injection  . TONSILLECTOMY AND ADENOIDECTOMY    . TUBAL LIGATION    . vocal polyps removed      There were no  vitals filed for this visit.  Subjective Assessment - 01/21/18 1236    Subjective  No falls. No more soreness from PT activities.     Pertinent History  OA, asthma, COPD, HTN, neuropathy, ant cervical fusion, breast CA, arthroscopic left knee sg, 3 frozen shoulders, Right basal joint replacement Rt hand, left middle finger fusion, carpal tunnel syndrome,     Patient Stated Goals  to improve gait so does not fall.     Currently in Pain?  Yes    Pain Score  4     Pain Location  Neck    Pain Orientation  Posterior;Left;Right    Pain Descriptors / Indicators  Aching;Discomfort    Pain Type  Chronic pain    Pain Onset  More than a month ago    Pain Frequency  Constant    Aggravating Factors   no pain patch, moving certain ways     Pain Relieving Factors  Pain patches                       [facility] Adult PT Treatment/Exercise - 01/21/18 1230      High Level Balance   High Level Balance Activities  Backward walking;Direction changes   in parallel bars with intermittent UE support & minA   High Level Balance Comments  used colored tiles to facilitate proper foot position / movement patterns.           Balance Exercises - 01/21/18 1230      Balance Exercises: Standing   Standing Eyes Opened  Wide (BOA);Foam/compliant surface;Head turns;5 reps    Standing Eyes Closed  Wide (BOA);Foam/compliant surface;10 secs;2 reps   tactile cues / manual cues   Balance Beam  standing on Airex foam beam in parallel bars, no UE support with touch to bars as needed for balance recovery: alternating fwd stepping to floor/back onto beam, then alternating bwd stepping to floor/back onto beam. min to mod assist for balance with cues on ex form/technique.                              Balance Exercises: Standing   Standing Eyes Closed Limitations  on airex in corner with chair in front for safety: feet apart with EC no head movements, progressing to EC head movements left<>right and up<>down. min  assist with cues on posture and weight shifitng to assist with balance.  Wall Bumps Limitations  standing across red beam: EO progressing to EC x 10  reps each, min guard to min assist with cues on ex form/technique          PT Short Term Goals - 01/08/18 1800      PT SHORT TERM GOAL #1   Title  Patient & husband demonstrate understanding of updated HEP. (All STGs Target Date: 02/01/2018)    Baseline        Time  1    Period  Months    Status  Revised    Target Date  02/01/18      PT SHORT TERM GOAL #2   Title  Patient ambulates 200' outdoors including negotiating ramps & curbs (assistance with rollator) with rollator walker with supervision.     Baseline        Time  1    Period  Months    Status  New    Target Date  02/01/18      PT SHORT TERM GOAL #3   Title  TUG with rollator walker <15sec    Time  1    Period  Months    Status  New    Target Date  02/01/18        PT Long Term Goals - 12/25/17 1209      PT LONG TERM GOAL #1   Title  Patient & husband verbalize & demonstrate understanding of ongoing HEP. (All LTGs Target Date: 03/01/2018)    Time  3    Period  Months    Status  New    Target Date  03/01/18      PT LONG TERM GOAL #2   Title  Patient & husband verbalize community fitness options including equipment.     Time  3    Period  Months    Status  On-going    Target Date  03/01/18      PT LONG TERM GOAL #3   Title  Patient ambulates 300' with rollator walker with supervision with safe rollator practices.     Time  3    Period  Months    Status  On-going    Target Date  03/01/18      PT LONG TERM GOAL #4   Title  Patient negotiates around household furniture, uses rollator walker to transport items like plate and sit to/from stand with rollator walker safely modified independent.     Time  3    Period  Months    Status  On-going    Target Date  03/01/18      PT LONG TERM GOAL #5   Title  Patient negotiates ramps & curbs with rollator walker  with husband assist (minA) safely.     Time  3    Period  Months    Status  On-going    Target Date  03/01/18      PT LONG TERM GOAL #6   Title  Timed Up & Go with rollator walker <13.5 seconds to indicate lower fall risk.     Time  3    Period  Months    Status  On-going    Target Date  03/01/18      PT LONG TERM GOAL #7   Title  Gait velocity with rollator walker >1.8 ft/sec to indicate lower fall risk.     Time  3    Period  Months    Status  On-going  Target Date  03/01/18      PT LONG TERM GOAL #8   Title  Patient reports neck pain increases <2 increments on 0-10 scale with standing & gait activities.     Time  3    Period  Months    Status  On-going    Target Date  03/01/18            Plan - 01/21/18 1319    Clinical Impression Statement  Today's skilled session focused on balance reactions. PT progressed movements without UE support simulating moving in kitchen near counter. Pt improved with repetition & instruction.     Rehab Potential  Good    Clinical Impairments Affecting Rehab Potential  unsafe rollator walker use    PT Frequency  2x / week    PT Duration  Other (comment)   90 days (13 weeks)   PT Treatment/Interventions  ADLs/Self Care Home Management;DME Instruction;Gait training;Stair training;Functional mobility training;Therapeutic activities;Therapeutic exercise;Balance training;Neuromuscular re-education;Cognitive remediation;Patient/family education;Passive range of motion;Vestibular    PT Next Visit Plan  balance activities to facilitate ankle/hip/step strategies, direction changes & scanning    PT Home Exercise Plan  MedBridge  Access Code: [email or web address]    Consulted and Agree with Plan of Care  Patient;Family member/caregiver    Family Member Consulted  husband, Irish Breisch       Patient will benefit from skilled therapeutic intervention in order to improve the following deficits and impairments:  Abnormal gait, Decreased activity tolerance,  Decreased balance, Decreased endurance, Decreased knowledge of use of DME, Decreased mobility, Decreased range of motion, Decreased strength, Impaired flexibility, Postural dysfunction, Pain  Visit Diagnosis: Muscle weakness (generalized)  Unsteadiness on feet  Other abnormalities of gait and mobility  Abnormal posture  Cervicalgia     Problem List Patient Active Problem List   Diagnosis Date Noted  . PCP NOTES >>>>>>>>>>>>>>>>>>> 09/16/2015  . Multiple thyroid nodules 03/30/2015  . Annual physical exam 08/07/2014  . Bloody discharge from nipple - s/p surgery, resolved  12/29/2013  . Breast mass, right 12/11/2013  . At high risk for falls 12/11/2013  . Renal cysts, acquired, bilateral--per urology 05/04/2013  . Elevated LFTs 04/01/2013  . Cervical spinal stenosis 11/21/2012  . Nondiabetic gastroparesis 08/20/2012  . Hypothyroidism 08/08/2012  . Gastroparesis 05/06/2012  . Muscle cramp 01/19/2012  . Fatigue 01/14/2012  . Neurogenic bladder, prolapse ,h/o urinary retention, self cath, sees urology 11/05/2011  . Fecal incontinence 10/03/2011  . Osteoporosis 07/24/2011  . Adenoma of cecum 07/17/2011  . Chronic constipation 07/17/2011  . Skin lesion 07/08/2011  . Chronic neck pain 04/16/2011  . Rectocele 04/16/2011  . Hypersomnia-- on dextroamphetamine 04/16/2011  . Vitamin D deficiency 04/16/2011  . Family history of colon cancer 04/16/2011  . Abnormal blood chemistry 04/16/2011  . Anemia   . COPD (chronic obstructive pulmonary disease) gold stage B   . Depression   . GERD (gastroesophageal reflux disease)   . Hyperlipidemia   . Hypertension   . Allergic rhinitis   . Pulmonary embolism-- in the 50s   . Skin cancer   . Seizures ([facility])   . CIDP (chronic inflammatory demyelinating polyneuropathy) , Ataxia, Imbalance -- f/u at [facility], DPT 01/21/2018, 1:28 PM  [facility] [address] [place], [place], [address] Phone: [phone]   Fax:  [phone]  Name: [name] MRN: [id number] Date of Birth: 05-04-1933

## 2018-01-23 ENCOUNTER — Encounter: Payer: Self-pay | Admitting: Physical Therapy

## 2018-01-23 ENCOUNTER — Ambulatory Visit: Payer: Medicare Other | Admitting: Physical Therapy

## 2018-01-23 VITALS — HR 78

## 2018-01-23 DIAGNOSIS — R293 Abnormal posture: Secondary | ICD-10-CM

## 2018-01-23 DIAGNOSIS — R2681 Unsteadiness on feet: Secondary | ICD-10-CM

## 2018-01-23 DIAGNOSIS — R2689 Other abnormalities of gait and mobility: Secondary | ICD-10-CM

## 2018-01-23 DIAGNOSIS — M6281 Muscle weakness (generalized): Secondary | ICD-10-CM

## 2018-01-23 DIAGNOSIS — M542 Cervicalgia: Secondary | ICD-10-CM

## 2018-01-24 NOTE — Therapy (Signed)
[facility] [address] [place], [place], [address] Phone: [phone]   Fax:  [phone]  Physical Therapy Treatment  Patient Details  Name: [name] MRN: [id number] Date of Birth: Apr 02, 1933 Referring Provider: Cira Servant, DO   Encounter Date: 01/23/2018  PT End of Session - 01/23/18 1456    Visit Number  12    Number of Visits  26    Date for PT Re-Evaluation  03/01/18    Authorization Type  Medicare & BCBS supplement, no VL, 100% covered    PT Start Time  1450    PT Stop Time  1530    PT Time Calculation (min)  40 min    Equipment Utilized During Treatment  Gait belt    Activity Tolerance  Patient tolerated treatment well;No increased pain    Behavior During Therapy  WFL for tasks assessed/performed       Past Medical History:  Diagnosis Date  . Allergic rhinitis   . Anemia   . Arthritis   . Asthma -COPD   . Blood transfusion 1957  . Breast mass    right breast in milk duct, bx neg  . Chronic fatigue syndrome   . Chronic inflammatory demyelinating polyneuropathy ([facility]) 03/2011  . chronic sinusitis 08/08/2012  . Colon polyp   . Constipation   . COPD (chronic obstructive pulmonary disease) ([facility])   . Cystocele 09/16/2012  . GERD (gastroesophageal reflux disease)   . Hyperlipidemia   . Hypertension   . Hypothyroid   . Neurogenic bladder 2013   husband caths pt, manages with timed void  . Osteoporosis   . Pulmonary embolism ([facility])    1980s  . Renal cyst    Non-complex, contrast MRI 2013 with L>R non-complex cysts, dominant 3.7 left lower pole  . Seizures ([facility])    1990 last seizures on meds Phenobarb  . Shingles late 61's  . Skin cancer    squamous cell  . Thyroid nodule    Right thyroid lobe, only seen on Sagittal imaging measures 2.3 cm in craniocaudal dimension and appears stable  . Tubular adenoma   . Vitamin D deficiency     Past Surgical History:  Procedure Laterality Date  .  ABDOMINAL HYSTERECTOMY    . ANTERIOR FUSION CLIVUS-C2 EXTRAORAL W/ ODONTOID EXCISION  8/14  . APPENDECTOMY    . BASAL CELL CARCINOMA EXCISION     face  . BLADDER SUSPENSION    . BREAST DUCTAL SYSTEM EXCISION Right 01/15/2014   Procedure: EXCISION DUCTAL SYSTEM RIGHT BREAST;  Surgeon: Stark Klein, MD;  Location: [facility];  Service: General;  Laterality: Right;  . BREAST EXCISIONAL BIOPSY    . CARPAL TUNNEL RELEASE     right  . CATARACT EXTRACTION     bilateral  . cervical neck ablation     x 7, C3-C6/3 screws and plate  . CESAREAN SECTION    . CYSTOCELE REPAIR    . DILATION AND CURETTAGE OF UTERUS    . duptyren's contracture right hand    . FINGER ARTHRODESIS Right 05/06/2015   Procedure: RIGHT RING PROXIMAL INTERPHALANGEAL FUSION (PIP);  Surgeon: Leanora Cover, MD;  Location: [facility];  Service: Orthopedics;  Laterality: Right;  . FINGER GANGLION CYST EXCISION     right  . JOINT REPLACEMENT     right and left basal joints of thumbs  . left finger fusion     3 fingers on left hand/one finger right  .  left ovary and tube removed    . NASAL POLYP SURGERY     4 sinus surgeries  . NASAL SEPTUM SURGERY    . PANNICULECTOMY    . PROXIMAL INTERPHALANGEAL FUSION (PIP) Left 09/09/2013   Procedure: FUSION LEFT INDEX PROXIMAL INTERPHALANGEAL JOINT (PIP);  Surgeon: Cammie Sickle., MD;  Location: [facility];  Service: Orthopedics;  Laterality: Left;  . right median nerve decompression    . SHOULDER ARTHROSCOPY     x2 left, 1 right  . sinus surgeries     x 4  . squamous lesions removed     neck and face  . STERIOD INJECTION Right 05/06/2015   Procedure: STEROID INJECTION;  Surgeon: Leanora Cover, MD;  Location: [facility];  Service: Orthopedics;  Laterality: Right;  Right Index Finger Proximal InterPhalangeal Joint Injection  . TONSILLECTOMY AND ADENOIDECTOMY    . TUBAL LIGATION    . vocal polyps removed      Vitals:    01/23/18 1454  Pulse: 78  SpO2: 98%    Subjective Assessment - 01/23/18 1454    Subjective  No new complaints. No falls. Did need 2 pain patches today due to increased neck pain today. Better since having the patches put on this am.     Pertinent History  OA, asthma, COPD, HTN, neuropathy, ant cervical fusion, breast CA, arthroscopic left knee sg, 3 frozen shoulders, Right basal joint replacement Rt hand, left middle finger fusion, carpal tunnel syndrome,     Patient Stated Goals  to improve gait so does not fall.     Currently in Pain?  Yes    Pain Score  5     Pain Location  Neck    Pain Orientation  Posterior;Right;Left    Pain Descriptors / Indicators  Aching;Discomfort    Pain Type  Chronic pain    Pain Onset  More than a month ago    Pain Frequency  Constant    Aggravating Factors   moving certain ways, not wearing pain patches    Pain Relieving Factors  pain patches          [facility] Adult PT Treatment/Exercise - 01/23/18 1458      Transfers   Transfers  Sit to Stand;Stand to Sit    Sit to Stand  5: Supervision;With upper extremity assist;With armrests;From chair/3-in-1    Stand to Sit  5: Supervision;With upper extremity assist;With armrests;To chair/3-in-1      Ambulation/Gait   Ambulation/Gait  Yes    Ambulation/Gait Assistance  5: Supervision;4: Min assist    Ambulation/Gait Assistance Details  SaO2 98% before walk outdoors. cues to relax UE' s with gait. min assist with rollator over gravel, otherwise supervision.     Ambulation Distance (Feet)  500 Feet   x1, plus around gym with other activities   Assistive device  Rollator    Gait Pattern  Step-through pattern;Decreased stride length    Ambulation Surface  Level;Unlevel;Indoor;Outdoor;Paved;Gravel;Grass          Balance Exercises - 01/23/18 1506      Balance Exercises: Standing   Standing Eyes Closed  Wide (BOA);Head turns;Foam/compliant surface;Other reps (comment);30 secs;Limitations    Rockerboard   Anterior/posterior;Lateral;Head turns;EO;EC;30 seconds;10 reps      Balance Exercises: Standing   Standing Eyes Closed Limitations  on airex in corner with chair in front for safety: feet hip width apart with EC no head movements, progressing to EC head movements left<>right and up<>down. min assist with  cues on posture and weight shifitng to assist with balance.     Rebounder Limitations  performed both ways on balance board with no UE support, intermittent touch to walls for balance assistance: rocking the board with emphasis on tall posture with EO, progressing to holding the board steady- EC no head movements, progressing to [facility] with head movements left<>right, then up<>down. cues/facilitation needed for posture and weight shifting to assist with balance.                               PT Short Term Goals - 01/08/18 1800      PT SHORT TERM GOAL #1   Title  Patient & husband demonstrate understanding of updated HEP. (All STGs Target Date: 02/01/2018)    Baseline        Time  1    Period  Months    Status  Revised    Target Date  02/01/18      PT SHORT TERM GOAL #2   Title  Patient ambulates 200' outdoors including negotiating ramps & curbs (assistance with rollator) with rollator walker with supervision.     Baseline        Time  1    Period  Months    Status  New    Target Date  02/01/18      PT SHORT TERM GOAL #3   Title  TUG with rollator walker <15sec    Time  1    Period  Months    Status  New    Target Date  02/01/18        PT Long Term Goals - 12/25/17 1209      PT LONG TERM GOAL #1   Title  Patient & husband verbalize & demonstrate understanding of ongoing HEP. (All LTGs Target Date: 03/01/2018)    Time  3    Period  Months    Status  New    Target Date  03/01/18      PT LONG TERM GOAL #2   Title  Patient & husband verbalize community fitness options including equipment.     Time  3    Period  Months    Status  On-going    Target Date  03/01/18      PT  LONG TERM GOAL #3   Title  Patient ambulates 300' with rollator walker with supervision with safe rollator practices.     Time  3    Period  Months    Status  On-going    Target Date  03/01/18      PT LONG TERM GOAL #4   Title  Patient negotiates around household furniture, uses rollator walker to transport items like plate and sit to/from stand with rollator walker safely modified independent.     Time  3    Period  Months    Status  On-going    Target Date  03/01/18      PT LONG TERM GOAL #5   Title  Patient negotiates ramps & curbs with rollator walker with husband assist (minA) safely.     Time  3    Period  Months    Status  On-going    Target Date  03/01/18      PT LONG TERM GOAL #6   Title  Timed Up & Go with rollator walker <13.5 seconds to indicate lower fall risk.     Time  3    Period  Months    Status  On-going    Target Date  03/01/18      PT LONG TERM GOAL #7   Title  Gait velocity with rollator walker >1.8 ft/sec to indicate lower fall risk.     Time  3    Period  Months    Status  On-going    Target Date  03/01/18      PT LONG TERM GOAL #8   Title  Patient reports neck pain increases <2 increments on 0-10 scale with standing & gait activities.     Time  3    Period  Months    Status  On-going    Target Date  03/01/18            Plan - 01/23/18 1456    Clinical Impression Statement  Today's skilled session continued its focus on safe use of rollator and balance reactions. No increase in pain. SaO2 >/= 93% with entire session. Increased to >/= 95% with rest breaks. Pt is making steady progress toward goals and should benefit from continued PT to progress toward unmet goals.     Rehab Potential  Good    Clinical Impairments Affecting Rehab Potential  unsafe rollator walker use    PT Frequency  2x / week    PT Duration  Other (comment)   90 days (13 weeks)   PT Treatment/Interventions  ADLs/Self Care Home Management;DME Instruction;Gait  training;Stair training;Functional mobility training;Therapeutic activities;Therapeutic exercise;Balance training;Neuromuscular re-education;Cognitive remediation;Patient/family education;Passive range of motion;Vestibular    PT Next Visit Plan  balance activities to facilitate ankle/hip/step strategies, direction changes & scanning    PT Home Exercise Plan  MedBridge  Access Code: [email or web address]    Consulted and Agree with Plan of Care  Patient;Family member/caregiver    Family Member Consulted  husband, Assia Meanor       Patient will benefit from skilled therapeutic intervention in order to improve the following deficits and impairments:  Abnormal gait, Decreased activity tolerance, Decreased balance, Decreased endurance, Decreased knowledge of use of DME, Decreased mobility, Decreased range of motion, Decreased strength, Impaired flexibility, Postural dysfunction, Pain  Visit Diagnosis: Muscle weakness (generalized)  Unsteadiness on feet  Other abnormalities of gait and mobility  Abnormal posture  Cervicalgia     Problem List Patient Active Problem List   Diagnosis Date Noted  . PCP NOTES >>>>>>>>>>>>>>>>>>> 09/16/2015  . Multiple thyroid nodules 03/30/2015  . Annual physical exam 08/07/2014  . Bloody discharge from nipple - s/p surgery, resolved  12/29/2013  . Breast mass, right 12/11/2013  . At high risk for falls 12/11/2013  . Renal cysts, acquired, bilateral--per urology 05/04/2013  . Elevated LFTs 04/01/2013  . Cervical spinal stenosis 11/21/2012  . Nondiabetic gastroparesis 08/20/2012  . Hypothyroidism 08/08/2012  . Gastroparesis 05/06/2012  . Muscle cramp 01/19/2012  . Fatigue 01/14/2012  . Neurogenic bladder, prolapse ,h/o urinary retention, self cath, sees urology 11/05/2011  . Fecal incontinence 10/03/2011  . Osteoporosis 07/24/2011  . Adenoma of cecum 07/17/2011  . Chronic constipation 07/17/2011  . Skin lesion 07/08/2011  . Chronic neck pain 04/16/2011  .  Rectocele 04/16/2011  . Hypersomnia-- on dextroamphetamine 04/16/2011  . Vitamin D deficiency 04/16/2011  . Family history of colon cancer 04/16/2011  . Abnormal blood chemistry 04/16/2011  . Anemia   . COPD (chronic obstructive pulmonary disease) gold stage B   . Depression   . GERD (gastroesophageal reflux disease)   . Hyperlipidemia   .  Hypertension   . Allergic rhinitis   . Pulmonary embolism-- in the 61s   . Skin cancer   . Seizures ([facility])   . CIDP (chronic inflammatory demyelinating polyneuropathy) , Ataxia, Imbalance -- f/u at [place], [facility], [facility] [address] [place], [place] [address] [phone] 01/24/18, 1:17 PM   Name: [name] MRN: [id number] Date of Birth: 25-Nov-1932

## 2018-01-25 ENCOUNTER — Ambulatory Visit
Admission: RE | Admit: 2018-01-25 | Discharge: 2018-01-25 | Disposition: A | Discharge status: Home / Self Care | Payer: Medicare Other | Source: Ambulatory Visit | Attending: General Surgery | Admitting: General Surgery

## 2018-01-25 DIAGNOSIS — Z1231 Encounter for screening mammogram for malignant neoplasm of breast: Secondary | ICD-10-CM

## 2018-01-29 ENCOUNTER — Ambulatory Visit: Payer: Medicare Other | Admitting: Physical Therapy

## 2018-01-31 ENCOUNTER — Ambulatory Visit: Payer: Medicare Other | Attending: Rehabilitation | Admitting: Physical Therapy

## 2018-01-31 ENCOUNTER — Encounter: Payer: Self-pay | Admitting: Physical Therapy

## 2018-01-31 VITALS — HR 85

## 2018-01-31 DIAGNOSIS — R293 Abnormal posture: Secondary | ICD-10-CM | POA: Insufficient documentation

## 2018-01-31 DIAGNOSIS — M6281 Muscle weakness (generalized): Secondary | ICD-10-CM | POA: Insufficient documentation

## 2018-01-31 DIAGNOSIS — R2689 Other abnormalities of gait and mobility: Secondary | ICD-10-CM | POA: Insufficient documentation

## 2018-01-31 DIAGNOSIS — M542 Cervicalgia: Secondary | ICD-10-CM | POA: Insufficient documentation

## 2018-01-31 DIAGNOSIS — R2681 Unsteadiness on feet: Secondary | ICD-10-CM

## 2018-01-31 NOTE — Therapy (Signed)
[facility] [address] [place], [place], [address] Phone: [phone]   Fax:  [phone]  Physical Therapy Treatment  Patient Details  Name: [name] MRN: [id number] Date of Birth: 03-Sep-1932 Referring Provider: Cira Servant, DO   Encounter Date: 01/31/2018  PT End of Session - 01/31/18 1203    Visit Number  13    Number of Visits  26    Date for PT Re-Evaluation  03/01/18    Authorization Type  Medicare & BCBS supplement, no VL, 100% covered    PT Start Time  1100    PT Stop Time  1144    PT Time Calculation (min)  44 min    Equipment Utilized During Treatment  Gait belt    Activity Tolerance  Patient tolerated treatment well;No increased pain    Behavior During Therapy  WFL for tasks assessed/performed       Past Medical History:  Diagnosis Date  . Allergic rhinitis   . Anemia   . Arthritis   . Asthma -COPD   . Blood transfusion 1957  . Breast mass    right breast in milk duct, bx neg  . Chronic fatigue syndrome   . Chronic inflammatory demyelinating polyneuropathy ([place]) 03/2011  . chronic sinusitis 08/08/2012  . Colon polyp   . Constipation   . COPD (chronic obstructive pulmonary disease) ([place])   . Cystocele 09/16/2012  . GERD (gastroesophageal reflux disease)   . Hyperlipidemia   . Hypertension   . Hypothyroid   . Neurogenic bladder 2013   husband caths pt, manages with timed void  . Osteoporosis   . Pulmonary embolism ([place])    1980s  . Renal cyst    Non-complex, contrast MRI 2013 with L>R non-complex cysts, dominant 3.7 left lower pole  . Seizures ([place])    1990 last seizures on meds Phenobarb  . Shingles late 84's  . Skin cancer    squamous cell  . Thyroid nodule    Right thyroid lobe, only seen on Sagittal imaging measures 2.3 cm in craniocaudal dimension and appears stable  . Tubular adenoma   . Vitamin D deficiency     Past Surgical History:  Procedure Laterality Date  .  ABDOMINAL HYSTERECTOMY    . ANTERIOR FUSION CLIVUS-C2 EXTRAORAL W/ ODONTOID EXCISION  8/14  . APPENDECTOMY    . BASAL CELL CARCINOMA EXCISION     face  . BLADDER SUSPENSION    . BREAST DUCTAL SYSTEM EXCISION Right 01/15/2014   Procedure: EXCISION DUCTAL SYSTEM RIGHT BREAST;  Surgeon: Stark Klein, MD;  Location: [facility];  Service: General;  Laterality: Right;  . BREAST EXCISIONAL BIOPSY Right   . CARPAL TUNNEL RELEASE     right  . CATARACT EXTRACTION     bilateral  . cervical neck ablation     x 7, C3-C6/3 screws and plate  . CESAREAN SECTION    . CYSTOCELE REPAIR    . DILATION AND CURETTAGE OF UTERUS    . duptyren's contracture right hand    . FINGER ARTHRODESIS Right 05/06/2015   Procedure: RIGHT RING PROXIMAL INTERPHALANGEAL FUSION (PIP);  Surgeon: Leanora Cover, MD;  Location: [facility];  Service: Orthopedics;  Laterality: Right;  . FINGER GANGLION CYST EXCISION     right  . JOINT REPLACEMENT     right and left basal joints of thumbs  . left finger fusion     3 fingers on left hand/one finger right  .  left ovary and tube removed    . NASAL POLYP SURGERY     4 sinus surgeries  . NASAL SEPTUM SURGERY    . PANNICULECTOMY    . PROXIMAL INTERPHALANGEAL FUSION (PIP) Left 09/09/2013   Procedure: FUSION LEFT INDEX PROXIMAL INTERPHALANGEAL JOINT (PIP);  Surgeon: Cammie Sickle., MD;  Location: [facility];  Service: Orthopedics;  Laterality: Left;  . right median nerve decompression    . SHOULDER ARTHROSCOPY     x2 left, 1 right  . sinus surgeries     x 4  . squamous lesions removed     neck and face  . STERIOD INJECTION Right 05/06/2015   Procedure: STEROID INJECTION;  Surgeon: Leanora Cover, MD;  Location: [facility];  Service: Orthopedics;  Laterality: Right;  Right Index Finger Proximal InterPhalangeal Joint Injection  . TONSILLECTOMY AND ADENOIDECTOMY    . TUBAL LIGATION    . vocal polyps removed      Vitals:    01/31/18 1106  Pulse: 85  SpO2: 97%    Subjective Assessment - 01/31/18 1100    Subjective  Her allergies are imflamed. She has upper respiratory infection with increased asthma attacks. She is on antibiodics and is improving.     Pertinent History  OA, asthma, COPD, HTN, neuropathy, ant cervical fusion, breast CA, arthroscopic left knee sg, 3 frozen shoulders, Right basal joint replacement Rt hand, left middle finger fusion, carpal tunnel syndrome,     Patient Stated Goals  to improve gait so does not fall.     Currently in Pain?  Yes    Pain Score  7     Pain Location  Neck    Pain Orientation  Posterior;Right;Left    Pain Descriptors / Indicators  Aching;Discomfort    Pain Type  Chronic pain    Pain Onset  More than a month ago    Pain Frequency  Constant    Aggravating Factors   moving certain ways, not wearing pain patches    Pain Relieving Factors  pain patches                       [facility] Adult PT Treatment/Exercise - 01/31/18 1100      Transfers   Transfers  Sit to Stand;Stand to Sit    Sit to Stand  5: Supervision;With upper extremity assist;With armrests;From chair/3-in-1   to rollator   Stand to Sit  5: Supervision;With upper extremity assist;With armrests;To chair/3-in-1   from rollator     Ambulation/Gait   Ambulation/Gait  Yes    Ambulation/Gait Assistance  5: Supervision    Ambulation/Gait Assistance Details  SpO2 97%    Ambulation Distance (Feet)  350 Feet    Assistive device  Rollator    Gait Pattern  Step-through pattern;Decreased stride length    Ambulation Surface  Indoor;Level    Ramp  5: Supervision   rollator   Curb  5: Supervision   rollator     Neuro Re-ed    Neuro Re-ed Details   seated in front of mirror with tactile / verbal cues on posture & minimizing accessory muscles.       Exercises   Exercises  Knee/Hip;Ankle      Knee/Hip Exercises: Standing   Heel Raises  Both;10 reps;3 seconds    Hip Flexion   Stengthening;Right;Left;10 reps;Knee straight   alternating LEs for balance   Hip Extension  Right;Left;10 reps;Knee straight   alternating LEs for  balance         Balance Exercises - 01/31/18 1100      Balance Exercises: Standing   Rockerboard  Anterior/posterior;Lateral;Head turns;EO;EC;30 seconds;10 reps   wt shifts, stabilization, recovery   Other Standing Exercises  alternate cone taps anterior & lateral with tactile cues        PT Education - 01/31/18 1203    Education Details  posture & breath control minimizing accessory muscles. Using mask to help control inhaling dust as husband is packing house for their move.     Person(s) Educated  Patient    Methods  Explanation;Demonstration;Verbal cues    Comprehension  Verbalized understanding       PT Short Term Goals - 01/31/18 1207      PT SHORT TERM GOAL #1   Title  Patient & husband demonstrate understanding of updated HEP. (All STGs Target Date: 02/01/2018)    Baseline  MET 01/31/2018    Time  1    Period  Months    Status  Achieved      PT SHORT TERM GOAL #2   Title  Patient ambulates 200' outdoors including negotiating ramps & curbs (assistance with rollator) with rollator walker with supervision.     Baseline  MET 01/31/2018    Time  1    Period  Months    Status  Achieved      PT SHORT TERM GOAL #3   Title  TUG with rollator walker <15sec    Baseline  MET 01/31/2018 TUG with rollator 14.99sec    Time  1    Period  Months    Status  Achieved        PT Long Term Goals - 12/25/17 1209      PT LONG TERM GOAL #1   Title  Patient & husband verbalize & demonstrate understanding of ongoing HEP. (All LTGs Target Date: 03/01/2018)    Time  3    Period  Months    Status  New    Target Date  03/01/18      PT LONG TERM GOAL #2   Title  Patient & husband verbalize community fitness options including equipment.     Time  3    Period  Months    Status  On-going    Target Date  03/01/18      PT LONG TERM GOAL #3    Title  Patient ambulates 300' with rollator walker with supervision with safe rollator practices.     Time  3    Period  Months    Status  On-going    Target Date  03/01/18      PT LONG TERM GOAL #4   Title  Patient negotiates around household furniture, uses rollator walker to transport items like plate and sit to/from stand with rollator walker safely modified independent.     Time  3    Period  Months    Status  On-going    Target Date  03/01/18      PT LONG TERM GOAL #5   Title  Patient negotiates ramps & curbs with rollator walker with husband assist (minA) safely.     Time  3    Period  Months    Status  On-going    Target Date  03/01/18      PT LONG TERM GOAL #6   Title  Timed Up & Go with rollator walker <13.5 seconds to indicate lower fall risk.  Time  3    Period  Months    Status  On-going    Target Date  03/01/18      PT LONG TERM GOAL #7   Title  Gait velocity with rollator walker >1.8 ft/sec to indicate lower fall risk.     Time  3    Period  Months    Status  On-going    Target Date  03/01/18      PT LONG TERM GOAL #8   Title  Patient reports neck pain increases <2 increments on 0-10 scale with standing & gait activities.     Time  3    Period  Months    Status  On-going    Target Date  03/01/18            Plan - 01/31/18 1210    Clinical Impression Statement  Patient met 3 of STGs set for this 30-day period. patient reports pleased with progress with no falls & less balance losses. PT also noted improvement in her ankle, hip & step strategy responses to balance activities.     Rehab Potential  Good    Clinical Impairments Affecting Rehab Potential  unsafe rollator walker use    PT Frequency  2x / week    PT Duration  Other (comment)   90 days (13 weeks)   PT Treatment/Interventions  ADLs/Self Care Home Management;DME Instruction;Gait training;Stair training;Functional mobility training;Therapeutic activities;Therapeutic exercise;Balance  training;Neuromuscular re-education;Cognitive remediation;Patient/family education;Passive range of motion;Vestibular    PT Next Visit Plan  balance activities to facilitate ankle/hip/step strategies, direction changes & scanning    PT Home Exercise Plan  MedBridge  Access Code: [email or web address]    Consulted and Agree with Plan of Care  Patient;Family member/caregiver    Family Member Consulted  husband, Azra Abrell       Patient will benefit from skilled therapeutic intervention in order to improve the following deficits and impairments:  Abnormal gait, Decreased activity tolerance, Decreased balance, Decreased endurance, Decreased knowledge of use of DME, Decreased mobility, Decreased range of motion, Decreased strength, Impaired flexibility, Postural dysfunction, Pain  Visit Diagnosis: Muscle weakness (generalized)  Unsteadiness on feet  Other abnormalities of gait and mobility  Abnormal posture  Cervicalgia     Problem List Patient Active Problem List   Diagnosis Date Noted  . PCP NOTES >>>>>>>>>>>>>>>>>>> 09/16/2015  . Multiple thyroid nodules 03/30/2015  . Annual physical exam 08/07/2014  . Bloody discharge from nipple - s/p surgery, resolved  12/29/2013  . Breast mass, right 12/11/2013  . At high risk for falls 12/11/2013  . Renal cysts, acquired, bilateral--per urology 05/04/2013  . Elevated LFTs 04/01/2013  . Cervical spinal stenosis 11/21/2012  . Nondiabetic gastroparesis 08/20/2012  . Hypothyroidism 08/08/2012  . Gastroparesis 05/06/2012  . Muscle cramp 01/19/2012  . Fatigue 01/14/2012  . Neurogenic bladder, prolapse ,h/o urinary retention, self cath, sees urology 11/05/2011  . Fecal incontinence 10/03/2011  . Osteoporosis 07/24/2011  . Adenoma of cecum 07/17/2011  . Chronic constipation 07/17/2011  . Skin lesion 07/08/2011  . Chronic neck pain 04/16/2011  . Rectocele 04/16/2011  . Hypersomnia-- on dextroamphetamine 04/16/2011  . Vitamin D deficiency 04/16/2011   . Family history of colon cancer 04/16/2011  . Abnormal blood chemistry 04/16/2011  . Anemia   . COPD (chronic obstructive pulmonary disease) gold stage B   . Depression   . GERD (gastroesophageal reflux disease)   . Hyperlipidemia   . Hypertension   . Allergic rhinitis   . Pulmonary  embolism-- in the 80s   . Skin cancer   . Seizures ([place])   . CIDP (chronic inflammatory demyelinating polyneuropathy) , Ataxia, Imbalance -- f/u at [facility], DPT 01/31/2018, 12:22 PM  [facility] [address] [place], [place], [address] Phone: [phone]   Fax:  [phone]  Name: [name] MRN: [id number] Date of Birth: 09/19/1932

## 2018-02-04 ENCOUNTER — Ambulatory Visit: Payer: Medicare Other | Admitting: Physical Therapy

## 2018-02-04 ENCOUNTER — Encounter: Payer: Self-pay | Admitting: Physical Therapy

## 2018-02-04 DIAGNOSIS — M6281 Muscle weakness (generalized): Secondary | ICD-10-CM

## 2018-02-04 DIAGNOSIS — R293 Abnormal posture: Secondary | ICD-10-CM

## 2018-02-04 DIAGNOSIS — R2689 Other abnormalities of gait and mobility: Secondary | ICD-10-CM

## 2018-02-04 DIAGNOSIS — R2681 Unsteadiness on feet: Secondary | ICD-10-CM

## 2018-02-04 NOTE — Therapy (Signed)
[facility] [address] [place], [place], [address] Phone: [phone]   Fax:  [phone]  Physical Therapy Treatment  Patient Details  Name: [name] MRN: [id number] Date of Birth: 17-Jun-1932 Referring Provider: Cira Servant, DO   Encounter Date: 02/04/2018  PT End of Session - 02/04/18 1209    Visit Number  14    Number of Visits  26    Date for PT Re-Evaluation  03/01/18    Authorization Type  Medicare & BCBS supplement, no VL, 100% covered    PT Start Time  1101    PT Stop Time  1145    PT Time Calculation (min)  44 min    Equipment Utilized During Treatment  Gait belt    Activity Tolerance  Patient tolerated treatment well;No increased pain    Behavior During Therapy  WFL for tasks assessed/performed       Past Medical History:  Diagnosis Date  . Allergic rhinitis   . Anemia   . Arthritis   . Asthma -COPD   . Blood transfusion 1957  . Breast mass    right breast in milk duct, bx neg  . Chronic fatigue syndrome   . Chronic inflammatory demyelinating polyneuropathy ([place]) 03/2011  . chronic sinusitis 08/08/2012  . Colon polyp   . Constipation   . COPD (chronic obstructive pulmonary disease) ([place])   . Cystocele 09/16/2012  . GERD (gastroesophageal reflux disease)   . Hyperlipidemia   . Hypertension   . Hypothyroid   . Neurogenic bladder 2013   husband caths pt, manages with timed void  . Osteoporosis   . Pulmonary embolism ([place])    1980s  . Renal cyst    Non-complex, contrast MRI 2013 with L>R non-complex cysts, dominant 3.7 left lower pole  . Seizures ([place])    1990 last seizures on meds Phenobarb  . Shingles late 70's  . Skin cancer    squamous cell  . Thyroid nodule    Right thyroid lobe, only seen on Sagittal imaging measures 2.3 cm in craniocaudal dimension and appears stable  . Tubular adenoma   . Vitamin D deficiency     Past Surgical History:  Procedure Laterality Date  .  ABDOMINAL HYSTERECTOMY    . ANTERIOR FUSION CLIVUS-C2 EXTRAORAL W/ ODONTOID EXCISION  8/14  . APPENDECTOMY    . BASAL CELL CARCINOMA EXCISION     face  . BLADDER SUSPENSION    . BREAST DUCTAL SYSTEM EXCISION Right 01/15/2014   Procedure: EXCISION DUCTAL SYSTEM RIGHT BREAST;  Surgeon: Stark Klein, MD;  Location: [facility];  Service: General;  Laterality: Right;  . BREAST EXCISIONAL BIOPSY Right   . CARPAL TUNNEL RELEASE     right  . CATARACT EXTRACTION     bilateral  . cervical neck ablation     x 7, C3-C6/3 screws and plate  . CESAREAN SECTION    . CYSTOCELE REPAIR    . DILATION AND CURETTAGE OF UTERUS    . duptyren's contracture right hand    . FINGER ARTHRODESIS Right 05/06/2015   Procedure: RIGHT RING PROXIMAL INTERPHALANGEAL FUSION (PIP);  Surgeon: Leanora Cover, MD;  Location: [facility];  Service: Orthopedics;  Laterality: Right;  . FINGER GANGLION CYST EXCISION     right  . JOINT REPLACEMENT     right and left basal joints of thumbs  . left finger fusion     3 fingers on left hand/one finger right  .  left ovary and tube removed    . NASAL POLYP SURGERY     4 sinus surgeries  . NASAL SEPTUM SURGERY    . PANNICULECTOMY    . PROXIMAL INTERPHALANGEAL FUSION (PIP) Left 09/09/2013   Procedure: FUSION LEFT INDEX PROXIMAL INTERPHALANGEAL JOINT (PIP);  Surgeon: Cammie Sickle., MD;  Location: [facility];  Service: Orthopedics;  Laterality: Left;  . right median nerve decompression    . SHOULDER ARTHROSCOPY     x2 left, 1 right  . sinus surgeries     x 4  . squamous lesions removed     neck and face  . STERIOD INJECTION Right 05/06/2015   Procedure: STEROID INJECTION;  Surgeon: Leanora Cover, MD;  Location: [facility];  Service: Orthopedics;  Laterality: Right;  Right Index Finger Proximal InterPhalangeal Joint Injection  . TONSILLECTOMY AND ADENOIDECTOMY    . TUBAL LIGATION    . vocal polyps removed      There  were no vitals filed for this visit.  Subjective Assessment - 02/04/18 1101    Subjective  No falls. She did get off balance once & bumped her left forearm on wall with bruise.     Pertinent History  OA, asthma, COPD, HTN, neuropathy, ant cervical fusion, breast CA, arthroscopic left knee sg, 3 frozen shoulders, Right basal joint replacement Rt hand, left middle finger fusion, carpal tunnel syndrome,     Patient Stated Goals  to improve gait so does not fall.     Currently in Pain?  Yes    Pain Score  3     Pain Location  Neck    Pain Orientation  Posterior;Left;Right    Pain Descriptors / Indicators  Aching;Discomfort    Pain Type  Chronic pain    Pain Onset  More than a month ago    Pain Frequency  Constant    Aggravating Factors   moving certain ways, not wearing pain patches    Pain Relieving Factors  pain patches      Patient / family education.  Fall prevention strategies with proper lighting especially night time, grab bar installation near toilet & shower with wall mount not floor for trip hazard,  Rug at shower edge must be anti-skid and color contrast to floor. Sitting on toilet for BM with feet on Yoga block for support, made of antislip material & light weight so can move easily. Pt return demo.  Use of bar stool (for her height a 22" stool is ideal) small padding for comfort for cooking to enable higher position for shoulders/neck to counter tops, core strength/stabilization with less UE & trunk support in safe environment.  Hand hold assist technique to protect, support & minimize rotation movements with osteoporosis. PT also recommended if she sits with rotation movement (feet not square with chair) then have her repeat 3 times with proper technique for motor learning.  Pt & husband verbalized understanding of above.                     Balance Exercises - 02/04/18 1100      Balance Exercises: Standing   Rockerboard  --    Other Standing Exercises  --         PT Education - 02/04/18 1145    Education Details  Fall prevention strategies, grab bars installation in new home, non-skid high color contrast in bathroom, use of bar stool for cooking, technique for husband to assist  Person(s) Educated  Patient;Spouse    Methods  Explanation;Demonstration;Verbal cues    Comprehension  Verbalized understanding       PT Short Term Goals - 01/31/18 1207      PT SHORT TERM GOAL #1   Title  Patient & husband demonstrate understanding of updated HEP. (All STGs Target Date: 02/01/2018)    Baseline  MET 01/31/2018    Time  1    Period  Months    Status  Achieved      PT SHORT TERM GOAL #2   Title  Patient ambulates 200' outdoors including negotiating ramps & curbs (assistance with rollator) with rollator walker with supervision.     Baseline  MET 01/31/2018    Time  1    Period  Months    Status  Achieved      PT SHORT TERM GOAL #3   Title  TUG with rollator walker <15sec    Baseline  MET 01/31/2018 TUG with rollator 14.99sec    Time  1    Period  Months    Status  Achieved        PT Long Term Goals - 12/25/17 1209      PT LONG TERM GOAL #1   Title  Patient & husband verbalize & demonstrate understanding of ongoing HEP. (All LTGs Target Date: 03/01/2018)    Time  3    Period  Months    Status  New    Target Date  03/01/18      PT LONG TERM GOAL #2   Title  Patient & husband verbalize community fitness options including equipment.     Time  3    Period  Months    Status  On-going    Target Date  03/01/18      PT LONG TERM GOAL #3   Title  Patient ambulates 300' with rollator walker with supervision with safe rollator practices.     Time  3    Period  Months    Status  On-going    Target Date  03/01/18      PT LONG TERM GOAL #4   Title  Patient negotiates around household furniture, uses rollator walker to transport items like plate and sit to/from stand with rollator walker safely modified independent.     Time  3    Period   Months    Status  On-going    Target Date  03/01/18      PT LONG TERM GOAL #5   Title  Patient negotiates ramps & curbs with rollator walker with husband assist (minA) safely.     Time  3    Period  Months    Status  On-going    Target Date  03/01/18      PT LONG TERM GOAL #6   Title  Timed Up & Go with rollator walker <13.5 seconds to indicate lower fall risk.     Time  3    Period  Months    Status  On-going    Target Date  03/01/18      PT LONG TERM GOAL #7   Title  Gait velocity with rollator walker >1.8 ft/sec to indicate lower fall risk.     Time  3    Period  Months    Status  On-going    Target Date  03/01/18      PT LONG TERM GOAL #8   Title  Patient reports neck pain increases <2  increments on 0-10 scale with standing & gait activities.     Time  3    Period  Months    Status  On-going    Target Date  03/01/18            Plan - 02/04/18 1209    Clinical Impression Statement  Patient and husband appear to have better understanding of fall prevention strategies and other PT instructions today. Today's session focused on patient education to help prepare new home environment for lower fall risk.     Rehab Potential  Good    Clinical Impairments Affecting Rehab Potential  unsafe rollator walker use    PT Frequency  2x / week    PT Duration  Other (comment)   90 days (13 weeks)   PT Treatment/Interventions  ADLs/Self Care Home Management;DME Instruction;Gait training;Stair training;Functional mobility training;Therapeutic activities;Therapeutic exercise;Balance training;Neuromuscular re-education;Cognitive remediation;Patient/family education;Passive range of motion;Vestibular    PT Next Visit Plan  balance activities to facilitate ankle/hip/step strategies, direction changes & scanning    PT Home Exercise Plan  MedBridge  Access Code: [email or web address]    Consulted and Agree with Plan of Care  Patient;Family member/caregiver    Family Member Consulted  husband, [name]       Patient will benefit from skilled therapeutic intervention in order to improve the following deficits and impairments:  Abnormal gait, Decreased activity tolerance, Decreased balance, Decreased endurance, Decreased knowledge of use of DME, Decreased mobility, Decreased range of motion, Decreased strength, Impaired flexibility, Postural dysfunction, Pain  Visit Diagnosis: Muscle weakness (generalized)  Unsteadiness on feet  Other abnormalities of gait and mobility  Abnormal posture     Problem List Patient Active Problem List   Diagnosis Date Noted  . PCP NOTES >>>>>>>>>>>>>>>>>>> 09/16/2015  . Multiple thyroid nodules 03/30/2015  . Annual physical exam 08/07/2014  . Bloody discharge from nipple - s/p surgery, resolved  12/29/2013  . Breast mass, right 12/11/2013  . At high risk for falls 12/11/2013  . Renal cysts, acquired, bilateral--per urology 05/04/2013  . Elevated LFTs 04/01/2013  . Cervical spinal stenosis 11/21/2012  . Nondiabetic gastroparesis 08/20/2012  . Hypothyroidism 08/08/2012  . Gastroparesis 05/06/2012  . Muscle cramp 01/19/2012  . Fatigue 01/14/2012  . Neurogenic bladder, prolapse ,h/o urinary retention, self cath, sees urology 11/05/2011  . Fecal incontinence 10/03/2011  . Osteoporosis 07/24/2011  . Adenoma of cecum 07/17/2011  . Chronic constipation 07/17/2011  . Skin lesion 07/08/2011  . Chronic neck pain 04/16/2011  . Rectocele 04/16/2011  . Hypersomnia-- on dextroamphetamine 04/16/2011  . Vitamin D deficiency 04/16/2011  . Family history of colon cancer 04/16/2011  . Abnormal blood chemistry 04/16/2011  . Anemia   . COPD (chronic obstructive pulmonary disease) gold stage B   . Depression   . GERD (gastroesophageal reflux disease)   . Hyperlipidemia   . Hypertension   . Allergic rhinitis   . Pulmonary embolism-- in the 29s   . Skin cancer   . Seizures ([place])   . CIDP (chronic inflammatory demyelinating polyneuropathy) , Ataxia,  Imbalance -- f/u at [facility], DPT 02/04/2018, 12:11 PM  [facility] [address] [place], [place], [address] Phone: [phone]   Fax:  [phone]  Name: [name] MRN: [id number] Date of Birth: [birth year]

## 2018-05-02 ENCOUNTER — Encounter: Payer: Self-pay | Admitting: *Deleted

## 2018-05-13 ENCOUNTER — Encounter: Payer: Self-pay | Admitting: Internal Medicine

## 2018-05-13 ENCOUNTER — Ambulatory Visit (INDEPENDENT_AMBULATORY_CARE_PROVIDER_SITE_OTHER): Payer: Medicare Other | Admitting: Internal Medicine

## 2018-05-13 VITALS — BP 121/62 | HR 83 | Ht 62.0 in | Wt 130.2 lb

## 2018-05-13 DIAGNOSIS — R1013 Epigastric pain: Secondary | ICD-10-CM | POA: Diagnosis not present

## 2018-05-13 DIAGNOSIS — Z86010 Personal history of colonic polyps: Secondary | ICD-10-CM

## 2018-05-13 DIAGNOSIS — K219 Gastro-esophageal reflux disease without esophagitis: Secondary | ICD-10-CM | POA: Diagnosis not present

## 2018-05-13 DIAGNOSIS — K5909 Other constipation: Secondary | ICD-10-CM | POA: Diagnosis not present

## 2018-05-13 NOTE — Progress Notes (Signed)
   Subjective:    Patient ID: [name], female    DOB: 10-Feb-1933, 84 y.o.   MRN: [id number]  HPI Christinamarie Tall is an 84 yo with a past medical history of large right-sided colon polyp status post right hemicolectomy in 2013, GERD, history of gastroparesis, chronic constipation with pelvic floor dysfunction, history of fundic gland polyp with low-grade dysplasia status post resection, CIDP, COPD, hypertension, hyperlipidemia and polyarthritis who is here for follow-up.  She was last seen on 07/06/2016 and is here today with her husband.  On the whole she reports she is doing well.  3 months ago when she made this appointment she was having issues with spontaneous bowel movement/accidents.  This has improved.  She is now using MiraLAX about every 3 days and having a bowel movement every few days.  If she does not use MiraLAX she will develop left lower quadrant abdominal pain and can also have fecal urgency and accidents.  No rectal bleeding or melena.  She has been having occasional reflux but has stopped the ranitidine over concern about [place].  Even being off of H2 blocker her reflux is only occasional.  No dysphagia or odynophagia.  She has been having issues with sinusitis but also ear infection.  She took fairly long course of levofloxacin.  She is now doing a sinus rinse which contains topical antibiotic.  She remains on prednisone 20 mg daily.  She continues with her inhalers.  Her primary care is now with Roque Cash at [facility] in [facility].  She continues to follow with [place] neurolog[facility].   Review of Systems As per HPI, otherwise negative  Current Medications, Allergies, Past Medical History, Past Surgical History, Family History and Social History were reviewed in [employer] record.     Objective:   Physical Exam BP 121/62   Pulse 83   Ht 5\' 2"  (1.575 m)   Wt 130 lb 3.2 oz (59.1 kg)   SpO2 98%   BMI 23.81 kg/m  Gen: awake, alert,  NAD HEENT: anicteric, op clear CV: RRR, no mrg Pulm: CTA b/l Abd: soft, NT/ND, +BS throughout Ext: no c/c/e Neuro: nonfocal     Assessment & Plan:  84 yo with a past medical history of large right-sided colon polyp status post right hemicolectomy in 2013, GERD, history of gastroparesis, chronic constipation with pelvic floor dysfunction, history of fundic gland polyp with low-grade dysplasia status post resection, CIDP, COPD, hypertension, hyperlipidemia and polyarthritis who is here for follow-up.  1.  Chronic constipation with pelvic floor dysfunction --we discussed that she needs to establish a regular MiraLAX regimen to avoid both constipation but also overflow and left lower quadrant abdominal pain.  She does not have normal rectal sensation making discriminating when bowel movement is needed.  I would like her to take three-quarter dose of MiraLAX every other day on schedule.  If stools too loose then cut down to half dose every other day.  2.  GERD --recently well controlled.  Given the issues with ranitidine we will change her to famotidine 20 mg every 12 hours on an as-needed basis  3.  History of colon polyps status post right hemicolectomy --most recent colonoscopy normal in 2016; further screening/surveillance deferred due to age  Annual follow-up, sooner if needed 25 minutes spent with the patient today. Greater than 50% was spent in counseling and coordination of care with the patient

## 2018-05-13 NOTE — Patient Instructions (Signed)
Please take 3/4 capful of Miralax every other day.  Please purchase the following medications over the counter and take as directed: Famotidine 20 mg twice daily as needed  Follow up in 1 year or as needed.  If you are age 82 or older, your body mass index should be between 23-30. Your Body mass index is 23.81 kg/m. If this is out of the aforementioned range listed, please consider follow up with your Primary Care Provider.  If you are age 84 or younger, your body mass index should be between 19-25. Your Body mass index is 23.81 kg/m. If this is out of the aformentioned range listed, please consider follow up with your Primary Care Provider.

## 2018-05-16 ENCOUNTER — Telehealth: Payer: Self-pay | Admitting: Internal Medicine

## 2018-05-16 MED ORDER — POLYETHYLENE GLYCOL 3350 17 GM/SCOOP PO POWD: ORAL | 3 refills | Status: DC

## 2018-05-16 NOTE — Telephone Encounter (Signed)
Rx sent 

## 2018-05-16 NOTE — Telephone Encounter (Signed)
PT REQUESTING REFILL FOR MIRALAX GENERIC AT [facility]. LAST SEEN 12.16.19

## 2018-05-30 DIAGNOSIS — J449 Chronic obstructive pulmonary disease, unspecified: Secondary | ICD-10-CM | POA: Diagnosis not present

## 2018-06-03 DIAGNOSIS — Z789 Other specified health status: Secondary | ICD-10-CM | POA: Diagnosis not present

## 2018-06-03 DIAGNOSIS — I10 Essential (primary) hypertension: Secondary | ICD-10-CM | POA: Diagnosis not present

## 2018-06-03 DIAGNOSIS — M810 Age-related osteoporosis without current pathological fracture: Secondary | ICD-10-CM | POA: Diagnosis not present

## 2018-06-13 DIAGNOSIS — R0602 Shortness of breath: Secondary | ICD-10-CM | POA: Diagnosis not present

## 2018-06-13 DIAGNOSIS — J449 Chronic obstructive pulmonary disease, unspecified: Secondary | ICD-10-CM | POA: Diagnosis not present

## 2018-06-20 DIAGNOSIS — M4802 Spinal stenosis, cervical region: Secondary | ICD-10-CM | POA: Diagnosis not present

## 2018-06-20 DIAGNOSIS — R252 Cramp and spasm: Secondary | ICD-10-CM | POA: Diagnosis not present

## 2018-07-04 DIAGNOSIS — E7800 Pure hypercholesterolemia, unspecified: Secondary | ICD-10-CM | POA: Diagnosis not present

## 2018-07-04 DIAGNOSIS — Z789 Other specified health status: Secondary | ICD-10-CM | POA: Diagnosis not present

## 2018-07-04 DIAGNOSIS — J449 Chronic obstructive pulmonary disease, unspecified: Secondary | ICD-10-CM | POA: Diagnosis not present

## 2018-07-09 DIAGNOSIS — L298 Other pruritus: Secondary | ICD-10-CM | POA: Diagnosis not present

## 2018-07-09 DIAGNOSIS — L821 Other seborrheic keratosis: Secondary | ICD-10-CM | POA: Diagnosis not present

## 2018-07-11 ENCOUNTER — Encounter: Payer: Self-pay | Admitting: Physical Therapy

## 2018-07-11 NOTE — Therapy (Signed)
[facility] [address] [place], [place], [address] Phone: [phone]   Fax:  [phone]  Patient Details  Name: [name] MRN: [id number] Date of Birth: March 12, 1933 Referring Provider:  Cira Servant, MD  Encounter Date: 07/11/2018  PHYSICAL THERAPY DISCHARGE SUMMARY  Visits from Start of Care: 14  Current functional level related to goals / functional outcomes: See last PT note on 02/04/2018   Remaining deficits: See last PT note on 02/04/2018   Education / Equipment: HEP & Fall prevention strategies.   Plan: Patient agrees to discharge.  Patient goals were not met. Patient is being discharged due to not returning since the last visit.  ?????       Berry Gallacher PT, DPT 07/11/2018, 11:29 AM  [facility] [address] [place], [place], [address] Phone: [phone]   Fax:  [phone]

## 2018-07-18 DIAGNOSIS — Z79899 Other long term (current) drug therapy: Secondary | ICD-10-CM | POA: Diagnosis not present

## 2018-07-18 DIAGNOSIS — G40309 Generalized idiopathic epilepsy and epileptic syndromes, not intractable, without status epilepticus: Secondary | ICD-10-CM | POA: Diagnosis not present

## 2018-07-18 DIAGNOSIS — I739 Peripheral vascular disease, unspecified: Secondary | ICD-10-CM | POA: Diagnosis not present

## 2018-07-18 DIAGNOSIS — I6523 Occlusion and stenosis of bilateral carotid arteries: Secondary | ICD-10-CM | POA: Diagnosis not present

## 2018-07-18 DIAGNOSIS — R5382 Chronic fatigue, unspecified: Secondary | ICD-10-CM | POA: Diagnosis not present

## 2018-08-08 DIAGNOSIS — R0789 Other chest pain: Secondary | ICD-10-CM | POA: Diagnosis not present

## 2018-08-08 DIAGNOSIS — G6181 Chronic inflammatory demyelinating polyneuritis: Secondary | ICD-10-CM | POA: Diagnosis not present

## 2018-08-08 DIAGNOSIS — R0602 Shortness of breath: Secondary | ICD-10-CM | POA: Diagnosis not present

## 2018-08-15 DIAGNOSIS — Z789 Other specified health status: Secondary | ICD-10-CM | POA: Diagnosis not present

## 2018-08-15 DIAGNOSIS — J449 Chronic obstructive pulmonary disease, unspecified: Secondary | ICD-10-CM | POA: Diagnosis not present

## 2018-08-15 DIAGNOSIS — I10 Essential (primary) hypertension: Secondary | ICD-10-CM | POA: Diagnosis not present

## 2018-08-22 ENCOUNTER — Ambulatory Visit: Payer: Medicare Other | Admitting: Family Medicine

## 2018-09-03 DIAGNOSIS — G6181 Chronic inflammatory demyelinating polyneuritis: Secondary | ICD-10-CM | POA: Diagnosis not present

## 2018-09-03 DIAGNOSIS — G40309 Generalized idiopathic epilepsy and epileptic syndromes, not intractable, without status epilepticus: Secondary | ICD-10-CM | POA: Diagnosis not present

## 2018-09-03 DIAGNOSIS — J309 Allergic rhinitis, unspecified: Secondary | ICD-10-CM | POA: Diagnosis not present

## 2018-09-03 DIAGNOSIS — R5382 Chronic fatigue, unspecified: Secondary | ICD-10-CM | POA: Diagnosis not present

## 2018-10-14 ENCOUNTER — Emergency Department (HOSPITAL_BASED_OUTPATIENT_CLINIC_OR_DEPARTMENT_OTHER): Payer: Medicare Other

## 2018-10-14 ENCOUNTER — Emergency Department (HOSPITAL_BASED_OUTPATIENT_CLINIC_OR_DEPARTMENT_OTHER)
Admission: EM | Admit: 2018-10-14 | Discharge: 2018-10-14 | Disposition: A | Discharge status: Home / Self Care | Payer: Medicare Other | Attending: Emergency Medicine | Admitting: Emergency Medicine

## 2018-10-14 ENCOUNTER — Other Ambulatory Visit: Payer: Self-pay

## 2018-10-14 ENCOUNTER — Encounter (HOSPITAL_BASED_OUTPATIENT_CLINIC_OR_DEPARTMENT_OTHER): Payer: Self-pay | Admitting: *Deleted

## 2018-10-14 DIAGNOSIS — W01198A Fall on same level from slipping, tripping and stumbling with subsequent striking against other object, initial encounter: Secondary | ICD-10-CM | POA: Diagnosis not present

## 2018-10-14 DIAGNOSIS — Z87891 Personal history of nicotine dependence: Secondary | ICD-10-CM | POA: Insufficient documentation

## 2018-10-14 DIAGNOSIS — W19XXXA Unspecified fall, initial encounter: Secondary | ICD-10-CM

## 2018-10-14 DIAGNOSIS — J449 Chronic obstructive pulmonary disease, unspecified: Secondary | ICD-10-CM | POA: Insufficient documentation

## 2018-10-14 DIAGNOSIS — Y929 Unspecified place or not applicable: Secondary | ICD-10-CM | POA: Insufficient documentation

## 2018-10-14 DIAGNOSIS — I10 Essential (primary) hypertension: Secondary | ICD-10-CM | POA: Diagnosis not present

## 2018-10-14 DIAGNOSIS — M542 Cervicalgia: Secondary | ICD-10-CM | POA: Diagnosis not present

## 2018-10-14 DIAGNOSIS — S0990XA Unspecified injury of head, initial encounter: Secondary | ICD-10-CM | POA: Diagnosis present

## 2018-10-14 DIAGNOSIS — Y9301 Activity, walking, marching and hiking: Secondary | ICD-10-CM | POA: Diagnosis not present

## 2018-10-14 DIAGNOSIS — S0101XA Laceration without foreign body of scalp, initial encounter: Secondary | ICD-10-CM | POA: Diagnosis not present

## 2018-10-14 DIAGNOSIS — Z85828 Personal history of other malignant neoplasm of skin: Secondary | ICD-10-CM | POA: Diagnosis not present

## 2018-10-14 DIAGNOSIS — Y998 Other external cause status: Secondary | ICD-10-CM | POA: Diagnosis not present

## 2018-10-14 DIAGNOSIS — Z79899 Other long term (current) drug therapy: Secondary | ICD-10-CM | POA: Diagnosis not present

## 2018-10-14 DIAGNOSIS — Z23 Encounter for immunization: Secondary | ICD-10-CM | POA: Insufficient documentation

## 2018-10-14 DIAGNOSIS — S199XXA Unspecified injury of neck, initial encounter: Secondary | ICD-10-CM | POA: Diagnosis not present

## 2018-10-14 DIAGNOSIS — M549 Dorsalgia, unspecified: Secondary | ICD-10-CM | POA: Diagnosis not present

## 2018-10-14 MED ORDER — LIDOCAINE-EPINEPHRINE (PF) 2 %-1:200000 IJ SOLN
10.0000 mL | Freq: Once | INTRAMUSCULAR | Status: AC
  Administered 2018-10-14: 10 mL
  Filled 2018-10-14 (×2): qty 10

## 2018-10-14 MED ORDER — TETANUS-DIPHTH-ACELL PERTUSSIS 5-2.5-18.5 LF-MCG/0.5 IM SUSP
0.5000 mL | Freq: Once | INTRAMUSCULAR | Status: AC
  Administered 2018-10-14: 0.5 mL via INTRAMUSCULAR
  Filled 2018-10-14: qty 0.5

## 2018-10-14 NOTE — ED Triage Notes (Signed)
She fell. Laceration to the back of her head. No LOC. She is alert oriented.

## 2018-10-14 NOTE — ED Notes (Signed)
Patient transported to CT 

## 2018-10-14 NOTE — ED Provider Notes (Signed)
[facility]ERG[facility] DEPARTMENT Provider Note   CSN: [id number] Arrival date & time: 10/14/18  1827    History   Chief Complaint Chief Complaint  Patient presents with  . Fall  . Laceration    HPI ADELINE PETITFRERE is a 85 y.o. female with h/o chronic neck pain s/p ACDF here for evaluation head laceration after fall. Patient usually walks with a rollator, states today the rollator slipped from under her and she slipped back falling down. States she hit the back of her head on the ground but denies any other injury. Her husband helped her get back up, has walked on her rollator since the fall.  Denies LOC. No anticoagulants. Other than local pain to laceration in posterior scalp she has no generalized headache. No associated dizziness, vision changes, changes to chronic neck pain, back pain, CP, SOB, distal extremity numbness or paresthesias. States at baseline she has chronic neck and low back pain but this is unchanged. She wears "pain patches" on her neck and has them on right now. No interventions. No alleviating/aggravating factors. Thinks last tetanus was 4 years ago.      HPI  Past Medical History:  Diagnosis Date  . Allergic rhinitis   . Anemia   . Arthritis   . Asthma -COPD   . Blood transfusion 1957  . Breast mass    right breast in milk duct, bx neg  . Chronic fatigue syndrome   . Chronic inflammatory demyelinating polyneuropathy ([place]) 03/2011  . chronic sinusitis 08/08/2012  . Colon polyp   . Constipation   . COPD (chronic obstructive pulmonary disease) ([place])   . Cystocele 09/16/2012  . GERD (gastroesophageal reflux disease)   . Hyperlipidemia   . Hypertension   . Hypothyroid   . Neurogenic bladder 2013   husband caths pt, manages with timed void  . Osteoporosis   . Pulmonary embolism ([place])    1980s  . Renal cyst    Non-complex, contrast MRI 2013 with L>R non-complex cysts, dominant 3.7 left lower pole  . Seizures ([place])    1990 last seizures on  meds Phenobarb  . Shingles late 77's  . Skin cancer    squamous cell  . Thyroid nodule    Right thyroid lobe, only seen on Sagittal imaging measures 2.3 cm in craniocaudal dimension and appears stable  . Tubular adenoma   . Vitamin D deficiency     Patient Active Problem List   Diagnosis Date Noted  . PCP NOTES >>>>>>>>>>>>>>>>>>> 09/16/2015  . Multiple thyroid nodules 03/30/2015  . Annual physical exam 08/07/2014  . Bloody discharge from nipple - s/p surgery, resolved  12/29/2013  . Breast mass, right 12/11/2013  . At high risk for falls 12/11/2013  . Renal cysts, acquired, bilateral--per urology 05/04/2013  . Elevated LFTs 04/01/2013  . Cervical spinal stenosis 11/21/2012  . Nondiabetic gastroparesis 08/20/2012  . Hypothyroidism 08/08/2012  . Gastroparesis 05/06/2012  . Muscle cramp 01/19/2012  . Fatigue 01/14/2012  . Neurogenic bladder, prolapse ,h/o urinary retention, self cath, sees urology 11/05/2011  . Fecal incontinence 10/03/2011  . Osteoporosis 07/24/2011  . Adenoma of cecum 07/17/2011  . Chronic constipation 07/17/2011  . Skin lesion 07/08/2011  . Chronic neck pain 04/16/2011  . Rectocele 04/16/2011  . Hypersomnia-- on dextroamphetamine 04/16/2011  . Vitamin D deficiency 04/16/2011  . Family history of colon cancer 04/16/2011  . Abnormal blood chemistry 04/16/2011  . Anemia   . COPD (chronic obstructive pulmonary disease) gold stage B   .  Depression   . GERD (gastroesophageal reflux disease)   . Hyperlipidemia   . Hypertension   . Allergic rhinitis   . Pulmonary embolism-- in the 42s   . Skin cancer   . Seizures ([place])   . CIDP (chronic inflammatory demyelinating polyneuropathy) , Ataxia, Imbalance -- f/u at [facility]     Past Surgical History:  Procedure Laterality Date  . ABDOMINAL HYSTERECTOMY    . ANTERIOR FUSION CLIVUS-C2 EXTRAORAL W/ ODONTOID EXCISION  8/14  . APPENDECTOMY    . BASAL CELL CARCINOMA EXCISION     face  . BLADDER SUSPENSION    .  BREAST DUCTAL SYSTEM EXCISION Right 01/15/2014   Procedure: EXCISION DUCTAL SYSTEM RIGHT BREAST;  Surgeon: Stark Klein, MD;  Location: [facility];  Service: General;  Laterality: Right;  . BREAST EXCISIONAL BIOPSY Right   . CARPAL TUNNEL RELEASE     right  . CATARACT EXTRACTION     bilateral  . cervical neck ablation     x 7, C3-C6/3 screws and plate  . CESAREAN SECTION    . CYSTOCELE REPAIR    . DILATION AND CURETTAGE OF UTERUS    . duptyren's contracture right hand    . FINGER ARTHRODESIS Right 05/06/2015   Procedure: RIGHT RING PROXIMAL INTERPHALANGEAL FUSION (PIP);  Surgeon: Leanora Cover, MD;  Location: [facility];  Service: Orthopedics;  Laterality: Right;  . FINGER GANGLION CYST EXCISION     right  . HEMICOLECTOMY Right   . JOINT REPLACEMENT     right and left basal joints of thumbs  . left finger fusion     3 fingers on left hand/one finger right  . left ovary and tube removed    . NASAL POLYP SURGERY     4 sinus surgeries  . NASAL SEPTUM SURGERY    . PANNICULECTOMY    . PROXIMAL INTERPHALANGEAL FUSION (PIP) Left 09/09/2013   Procedure: FUSION LEFT INDEX PROXIMAL INTERPHALANGEAL JOINT (PIP);  Surgeon: Cammie Sickle., MD;  Location: [facility];  Service: Orthopedics;  Laterality: Left;  . right median nerve decompression    . SHOULDER ARTHROSCOPY     x2 left, 1 right  . sinus surgeries     x 4  . squamous lesions removed     neck and face  . STERIOD INJECTION Right 05/06/2015   Procedure: STEROID INJECTION;  Surgeon: Leanora Cover, MD;  Location: [facility];  Service: Orthopedics;  Laterality: Right;  Right Index Finger Proximal InterPhalangeal Joint Injection  . TONSILLECTOMY AND ADENOIDECTOMY    . TUBAL LIGATION    . vocal polyps removed       OB History   No obstetric history on file.      Home Medications    Prior to Admission medications   Medication Sig Start Date End Date Taking? Authorizing  Provider  albuterol (PROAIR HFA) 108 (90 BASE) MCG/ACT inhaler Inhale 1 puff into the lungs as needed. 10/15/14  Yes [facility] Stain, MD  amLODipine (NORVASC) 10 MG tablet Take 1 tablet (10 mg total) by mouth daily. 12/01/15  Yes Paz, Alda Berthold, MD  arformoterol (BROVANA) 15 MCG/2ML NEBU Take 2 mLs (15 mcg total) by nebulization 2 (two) times daily. 10/15/14  Yes [facility] Stain, MD  Ascorbic Acid (VITAMIN C PO) Take 2 capsules by mouth daily.   Yes [provider]  atorvastatin (LIPITOR) 40 MG tablet Take 1 tablet (40 mg total) by mouth daily at 12  noon. 09/29/15  Yes Paz, Jacqulyn Bath E, MD  budesonide (PULMICORT) 0.25 MG/2ML nebulizer solution Take 2 mLs (0.25 mg total) by nebulization 2 (two) times daily. 10/15/14  Yes [facility] Stain, MD  CALCIUM-VITAMIN D PO Take 1 tablet by mouth daily.    Yes [provider]  denosumab (PROLIA) 60 MG/ML SOLN injection Inject 60 mg into the skin every 6 (six) months.   Yes [provider]  dextroamphetamine (DEXTROSTAT) 10 MG tablet Take 1 tablet (10 mg total) by mouth 4 (four) times daily. 11/05/15  Yes Paz, Alda Berthold, MD  diclofenac sodium (VOLTAREN) 1 % GEL Apply 1 application topically as needed.   Yes [provider]  GABAPENTIN PO Take by mouth.   Yes [provider]  levothyroxine (SYNTHROID, LEVOTHROID) 25 MCG tablet Take 1.5 tablets (37.5 mcg total) by mouth daily before breakfast. 12/02/15  Yes Paz, Alda Berthold, MD  mometasone (NASONEX) 50 MCG/ACT nasal spray PLACE 2 SPRAYS INTO THE NOSE DAILY 10/15/14  Yes [facility] Stain, MD  montelukast (SINGULAIR) 10 MG tablet Take 1 tablet (10 mg total) by mouth at bedtime. 12/29/15  Yes Parrett, Tammy S, NP  PHENobarbital (LUMINAL) 32.4 MG tablet TAKE 1 TABLET BY MOUTH DAILY AT 6 AM AND NOON AND TAKE 2 TABLETS BY MOUTH AT 8 PM 09/29/15  Yes Paz, Jacqulyn Bath E, MD  polyethylene glycol powder (GLYCOLAX/MIRALAX) powder Take 3/4 capful dissolved in at least 8 ounces water/juice once daily. May titrate as  needed 05/16/18  Yes Pyrtle, Lajuan Lines, MD  predniSONE (DELTASONE) 20 MG tablet Take 1 tablet (20 mg total) by mouth daily. 10/21/15  Yes Parrett, Tammy S, NP  TEGRETOL-XR 100 MG 12 hr tablet Take 500 mg by mouth daily.  03/25/15  Yes [provider]    Family History Family History  Problem Relation Age of Onset  . Heart disease Mother   . Hyperlipidemia Mother        Jerilynn Mages, aunt  . Ovarian cancer Sister   . Lung cancer Sister        lung - stage 1  . Thyroid cancer Sister   . Colon cancer Father        25  . Malignant hyperthermia Cousin   . Stomach cancer Other        first cousin  . Skin cancer Daughter   . Breast cancer Neg Hx     Social History Social History   Tobacco Use  . Smoking status: Former Smoker    Packs/day: 0.50    Years: 3.00    Pack years: 1.50    Types: Cigarettes    Last attempt to quit: 05/29/1978    Years since quitting: 40.4  . Smokeless tobacco: Never Used  . Tobacco comment: quit smoking 35 years ago  Substance Use Topics  . Alcohol use: No  . Drug use: No     Allergies   Iodine; Morphine and related; and Contrast media [iodinated diagnostic agents]   Review of Systems Review of Systems  Musculoskeletal: Positive for back pain (chronic) and neck pain (chronic).  Skin: Positive for wound.  All other systems reviewed and are negative.    Physical Exam Updated Vital Signs BP (!) 152/74 (BP Location: Right Arm)   Pulse 95   Temp 97.6 F (36.4 C) (Oral)   Resp 18   Ht 5\' 3"  (1.6 m)   Wt 59 kg   SpO2 96%   BMI 23.03 kg/m   Physical Exam Vitals signs and  nursing note reviewed.  Constitutional:      General: She is not in acute distress.    Appearance: She is well-developed.     Comments: NAD. No cervical collar in place.   HENT:     Head: Normocephalic. Laceration present.     Comments: 5 cm straight laceration occipital prominence, minimally locally tender. No other scalp, facial, nasal bone tenderness.     Right Ear:  External ear normal.     Left Ear: External ear normal.     Ears:     Comments: No hemotympanum. No Battle's sign.    Nose: Nose normal.     Comments: No intranasal bleeding or rhinorrhea. Septum midline.     Mouth/Throat:     Comments: No intraoral or tongue injury Eyes:     Conjunctiva/sclera: Conjunctivae normal.     Comments: No racoon's eyes   Neck:     Musculoskeletal: Normal range of motion and neck supple.     Comments: C-spine: no midline or paraspinal muscular tenderness. Decreased flexion, R and L rotation (pt states this is chronic since neck surgery).   Cardiovascular:     Rate and Rhythm: Normal rate and regular rhythm.     Pulses:          Radial pulses are 1+ on the right side and 1+ on the left side.       Dorsalis pedis pulses are 1+ on the right side and 1+ on the left side.     Heart sounds: Normal heart sounds, S1 normal and S2 normal.  Pulmonary:     Effort: Pulmonary effort is normal.     Breath sounds: Normal breath sounds. No decreased breath sounds.  Chest:     Comments: No AP thorax tenderness. Full fall/rise of chest wall. Abdominal:     Palpations: Abdomen is soft.     Tenderness: There is no abdominal tenderness.  Musculoskeletal: Normal range of motion.        General: No deformity.     Comments:  TL spine: no midline or paraspinal muscle tenderness. Full passive ROM of upper/lower extremities without pain Pelvis: no AP/L instability with compression. No leg shortening or rotation. Full ROM of hips without pain.   Skin:    General: Skin is warm and dry.     Capillary Refill: Capillary refill takes less than 2 seconds.  Neurological:     Mental Status: She is alert, oriented to person, place, and time and easily aroused.     Comments:  Alert and oriented to self, place, time and event.  Speech is fluent without dysarthria or dysphasia. Strength 5/5 in upper/lower extremities   Sensation to light touch intact in face, hands and feet. Sits on  side of the bed without truncal sway No pronator drift. No leg drop. Normal finger-to-nose.  CN I not tested CN II grossly intact visual fields bilaterally. Unable to visualize posterior eye. CN III, IV, VI PEERL and EOMs intact bilaterally CN V light touch intact in all 3 divisions of trigeminal nerve CN VII facial movements symmetric CN VIII not tested CN IX, X no uvula deviation, symmetric rise of soft palate  CN XI 5/5 SCM and trapezius strength bilaterally  CN XII Midline tongue protrusion, symmetric L/R movements  Psychiatric:        Behavior: Behavior normal. Behavior is cooperative.        Thought Content: Thought content normal.        Judgment: Judgment normal.  ED Treatments / Results  Labs (all labs ordered are listed, but only abnormal results are displayed) Labs Reviewed - No data to display  EKG None  Radiology Ct Head Wo Contrast  Result Date: 10/14/2018 CLINICAL DATA:  Fall.  Laceration of the back of her head. EXAM: CT HEAD WITHOUT CONTRAST CT CERVICAL SPINE WITHOUT CONTRAST TECHNIQUE: Multidetector CT imaging of the head and cervical spine was performed following the standard protocol without intravenous contrast. Multiplanar CT image reconstructions of the cervical spine were also generated. COMPARISON:  CT head and cervical spine dated August 29, 2017. FINDINGS: CT HEAD FINDINGS Brain: No evidence of acute infarction, hemorrhage, hydrocephalus, extra-axial collection or mass lesion/mass effect. Stable atrophy and chronic microvascular ischemic changes. Vascular: Atherosclerotic vascular calcification of the carotid siphons. No hyperdense vessel. Skull: Normal. Negative for fracture or focal lesion. Sinuses/Orbits: No acute finding. Postsurgical changes of the maxillary sinuses and ethmoid air cells. Small air-fluid level in the left sphenoid sinus. Other: None. CT CERVICAL SPINE FINDINGS Alignment: No traumatic malalignment. Unchanged trace anterolisthesis at  C3-C4. Skull base and vertebrae: No acute fracture. No primary bone lesion or focal pathologic process. Soft tissues and spinal canal: No prevertebral fluid or swelling. No visible canal hematoma. Disc levels: Prior C4-C6 ACDF without solid interbody fusion, similar to prior study. No evidence of hardware failure or loosening. Unchanged moderate disc height loss at C6-C7 and C7-T1. Unchanged moderate facet arthropathy at C2-C3. Upper chest: Negative. Other: Unchanged bilateral hypodense thyroid nodules measuring up to 2.2 cm. These were previously evaluated with biopsy in June 2016. IMPRESSION: 1.  No acute intracranial abnormality. 2.  No acute cervical spine fracture. Electronically Signed   By: Titus Dubin M.D.   On: 10/14/2018 19:49   Ct Cervical Spine Wo Contrast  Result Date: 10/14/2018 CLINICAL DATA:  Fall.  Laceration of the back of her head. EXAM: CT HEAD WITHOUT CONTRAST CT CERVICAL SPINE WITHOUT CONTRAST TECHNIQUE: Multidetector CT imaging of the head and cervical spine was performed following the standard protocol without intravenous contrast. Multiplanar CT image reconstructions of the cervical spine were also generated. COMPARISON:  CT head and cervical spine dated August 29, 2017. FINDINGS: CT HEAD FINDINGS Brain: No evidence of acute infarction, hemorrhage, hydrocephalus, extra-axial collection or mass lesion/mass effect. Stable atrophy and chronic microvascular ischemic changes. Vascular: Atherosclerotic vascular calcification of the carotid siphons. No hyperdense vessel. Skull: Normal. Negative for fracture or focal lesion. Sinuses/Orbits: No acute finding. Postsurgical changes of the maxillary sinuses and ethmoid air cells. Small air-fluid level in the left sphenoid sinus. Other: None. CT CERVICAL SPINE FINDINGS Alignment: No traumatic malalignment. Unchanged trace anterolisthesis at C3-C4. Skull base and vertebrae: No acute fracture. No primary bone lesion or focal pathologic process. Soft  tissues and spinal canal: No prevertebral fluid or swelling. No visible canal hematoma. Disc levels: Prior C4-C6 ACDF without solid interbody fusion, similar to prior study. No evidence of hardware failure or loosening. Unchanged moderate disc height loss at C6-C7 and C7-T1. Unchanged moderate facet arthropathy at C2-C3. Upper chest: Negative. Other: Unchanged bilateral hypodense thyroid nodules measuring up to 2.2 cm. These were previously evaluated with biopsy in June 2016. IMPRESSION: 1.  No acute intracranial abnormality. 2.  No acute cervical spine fracture. Electronically Signed   By: Titus Dubin M.D.   On: 10/14/2018 19:49    Procedures .Marland KitchenLaceration Repair Date/Time: 10/14/2018 7:49 PM Performed by: Kinnie Feil, PA-C Authorized by: Kinnie Feil, PA-C   Consent:    Consent obtained:  Verbal  Consent given by:  Patient   Risks discussed:  Infection, need for additional repair, pain, poor cosmetic result and poor wound healing   Alternatives discussed:  No treatment and delayed treatment Universal protocol:    Procedure explained and questions answered to patient or proxy's satisfaction: yes     Relevant documents present and verified: yes     Test results available and properly labeled: yes     Imaging studies available: yes     Required blood products, implants, devices, and special equipment available: yes     Site/side marked: yes     Immediately prior to procedure, a time out was called: yes     Patient identity confirmed:  Verbally with patient Anesthesia (see MAR for exact dosages):    Anesthesia method:  Local infiltration   Local anesthetic:  Lidocaine 2% WITH epi Laceration details:    Location:  Scalp   Scalp location:  Occipital   Length (cm):  5 Repair type:    Repair type:  Intermediate Pre-procedure details:    Preparation:  Patient was prepped and draped in usual sterile fashion Exploration:    Hemostasis achieved with:  Epinephrine and direct  pressure   Wound exploration: entire depth of wound probed and visualized     Wound extent: no underlying fracture noted     Contaminated: no   Treatment:    Area cleansed with:  Shur-Clens   Amount of cleaning:  Standard   Irrigation solution:  Tap water   Visualized foreign bodies/material removed: no   Skin repair:    Repair method:  Staples   Number of staples:  5 Approximation:    Approximation:  Close Post-procedure details:    Dressing:  Open (no dressing)   Patient tolerance of procedure:  Tolerated well, no immediate complications   (including critical care time)  Medications Ordered in ED Medications  lidocaine-EPINEPHrine (XYLOCAINE W/EPI) 2 %-1:200000 (PF) injection 10 mL (10 mLs Infiltration Given 10/14/18 1858)  Tdap (BOOSTRIX) injection 0.5 mL (0.5 mLs Intramuscular Given 10/14/18 1858)     Initial Impression / Assessment and Plan / ED Course  I have reviewed the triage vital signs and the nursing notes.  Pertinent labs & imaging results that were available during my care of the patient were reviewed by me and considered in my medical decision making (see chart for details).       85 y.o. yo female here after mechanical fall.  Reports mild pain to occipital scalp laceration, otherwise asymptomatic HD stable on arrival.  Alert.  Thorough MSK exam reveals no obvious signs of significant CTL-spine, chest, abdominal, pelvis, extremity injury. Given age, scalp contusion, previous cervical surgery, CT head/c-spine ordered which were stable without acute injuries.  Results given to patients.  Scalp laceration was repaired in the ED with 5 staples, no immediate complications.  She has been ambulatory since the fall at baseline.  Appropriate for discharge with wound care instructions, Tylenol for pain.  Staple removal in 7 days.  Return precautions given.   Final Clinical Impressions(s) / ED Diagnoses   Final diagnoses:  Fall, initial encounter  Laceration of scalp,  initial encounter    ED Discharge Orders    None       Kinnie Feil, PA-C 10/14/18 [place], [place], DO 10/14/18 2024

## 2018-10-14 NOTE — Discharge Instructions (Signed)
You were seen in the ED after a fall.  You had a scalp wound.  CTs today are stable and no intracranial injuries.  Your cervical spine hardware is in place and stable.  Your tetanus was updated today.  Your wound was repaired with 5 staples.  Keep this wound dry for the next 24 hours.  Take Tylenol for pain.  Ice your scalp to help with swelling.  After 24 hours you may rinse your scalp/wound with clean water and soap.  Tap dry.  Can apply a thin layer of over-the-counter antibiotic ointment.  Follow-up with your primary care doctor or return to urgent care/emergency department for staple removal in the next 7 days.  Return to the ED if there is fever, chills, redness, swelling, warmth or pus from your wound.

## 2018-10-18 DIAGNOSIS — Y92009 Unspecified place in unspecified non-institutional (private) residence as the place of occurrence of the external cause: Secondary | ICD-10-CM | POA: Diagnosis not present

## 2018-10-18 DIAGNOSIS — W19XXXA Unspecified fall, initial encounter: Secondary | ICD-10-CM | POA: Diagnosis not present

## 2018-10-18 DIAGNOSIS — S40811A Abrasion of right upper arm, initial encounter: Secondary | ICD-10-CM | POA: Diagnosis not present

## 2018-10-18 DIAGNOSIS — M79621 Pain in right upper arm: Secondary | ICD-10-CM | POA: Diagnosis not present

## 2018-10-21 DIAGNOSIS — Y92009 Unspecified place in unspecified non-institutional (private) residence as the place of occurrence of the external cause: Secondary | ICD-10-CM | POA: Diagnosis not present

## 2018-10-21 DIAGNOSIS — W19XXXD Unspecified fall, subsequent encounter: Secondary | ICD-10-CM | POA: Diagnosis not present

## 2018-10-21 DIAGNOSIS — R5382 Chronic fatigue, unspecified: Secondary | ICD-10-CM | POA: Diagnosis not present

## 2018-10-24 DIAGNOSIS — H10413 Chronic giant papillary conjunctivitis, bilateral: Secondary | ICD-10-CM | POA: Diagnosis not present

## 2018-10-24 DIAGNOSIS — H04123 Dry eye syndrome of bilateral lacrimal glands: Secondary | ICD-10-CM | POA: Diagnosis not present

## 2018-10-24 DIAGNOSIS — H524 Presbyopia: Secondary | ICD-10-CM | POA: Diagnosis not present

## 2018-10-24 DIAGNOSIS — H40013 Open angle with borderline findings, low risk, bilateral: Secondary | ICD-10-CM | POA: Diagnosis not present

## 2018-10-24 DIAGNOSIS — H52223 Regular astigmatism, bilateral: Secondary | ICD-10-CM | POA: Diagnosis not present

## 2018-10-28 DIAGNOSIS — R739 Hyperglycemia, unspecified: Secondary | ICD-10-CM | POA: Diagnosis not present

## 2018-10-28 DIAGNOSIS — Z79899 Other long term (current) drug therapy: Secondary | ICD-10-CM | POA: Diagnosis not present

## 2018-10-28 DIAGNOSIS — Z760 Encounter for issue of repeat prescription: Secondary | ICD-10-CM | POA: Diagnosis not present

## 2018-10-28 DIAGNOSIS — E7800 Pure hypercholesterolemia, unspecified: Secondary | ICD-10-CM | POA: Diagnosis not present

## 2018-10-28 DIAGNOSIS — E039 Hypothyroidism, unspecified: Secondary | ICD-10-CM | POA: Diagnosis not present

## 2018-11-01 DIAGNOSIS — Z789 Other specified health status: Secondary | ICD-10-CM | POA: Diagnosis not present

## 2018-11-01 DIAGNOSIS — E7800 Pure hypercholesterolemia, unspecified: Secondary | ICD-10-CM | POA: Diagnosis not present

## 2018-11-01 DIAGNOSIS — J449 Chronic obstructive pulmonary disease, unspecified: Secondary | ICD-10-CM | POA: Diagnosis not present

## 2018-11-14 DIAGNOSIS — R252 Cramp and spasm: Secondary | ICD-10-CM | POA: Diagnosis not present

## 2018-11-28 DIAGNOSIS — G40309 Generalized idiopathic epilepsy and epileptic syndromes, not intractable, without status epilepticus: Secondary | ICD-10-CM | POA: Diagnosis not present

## 2018-11-28 DIAGNOSIS — Z79899 Other long term (current) drug therapy: Secondary | ICD-10-CM | POA: Diagnosis not present

## 2018-11-28 DIAGNOSIS — R5382 Chronic fatigue, unspecified: Secondary | ICD-10-CM | POA: Diagnosis not present

## 2018-12-04 DIAGNOSIS — Z789 Other specified health status: Secondary | ICD-10-CM | POA: Diagnosis not present

## 2018-12-04 DIAGNOSIS — E7800 Pure hypercholesterolemia, unspecified: Secondary | ICD-10-CM | POA: Diagnosis not present

## 2018-12-09 DIAGNOSIS — Z79899 Other long term (current) drug therapy: Secondary | ICD-10-CM | POA: Diagnosis not present

## 2018-12-24 ENCOUNTER — Other Ambulatory Visit: Payer: Self-pay | Admitting: General Surgery

## 2018-12-24 DIAGNOSIS — Z1231 Encounter for screening mammogram for malignant neoplasm of breast: Secondary | ICD-10-CM

## 2018-12-25 DIAGNOSIS — J45909 Unspecified asthma, uncomplicated: Secondary | ICD-10-CM | POA: Diagnosis not present

## 2019-01-07 DIAGNOSIS — Z789 Other specified health status: Secondary | ICD-10-CM | POA: Diagnosis not present

## 2019-01-07 DIAGNOSIS — D229 Melanocytic nevi, unspecified: Secondary | ICD-10-CM | POA: Diagnosis not present

## 2019-01-07 DIAGNOSIS — J209 Acute bronchitis, unspecified: Secondary | ICD-10-CM | POA: Diagnosis not present

## 2019-01-07 DIAGNOSIS — E039 Hypothyroidism, unspecified: Secondary | ICD-10-CM | POA: Diagnosis not present

## 2019-01-07 DIAGNOSIS — L821 Other seborrheic keratosis: Secondary | ICD-10-CM | POA: Diagnosis not present

## 2019-01-07 DIAGNOSIS — Z85828 Personal history of other malignant neoplasm of skin: Secondary | ICD-10-CM | POA: Diagnosis not present

## 2019-01-07 DIAGNOSIS — L309 Dermatitis, unspecified: Secondary | ICD-10-CM | POA: Diagnosis not present

## 2019-01-07 DIAGNOSIS — L814 Other melanin hyperpigmentation: Secondary | ICD-10-CM | POA: Diagnosis not present

## 2019-01-07 DIAGNOSIS — M349 Systemic sclerosis, unspecified: Secondary | ICD-10-CM | POA: Diagnosis not present

## 2019-01-13 DIAGNOSIS — N139 Obstructive and reflux uropathy, unspecified: Secondary | ICD-10-CM | POA: Diagnosis not present

## 2019-01-20 DIAGNOSIS — N281 Cyst of kidney, acquired: Secondary | ICD-10-CM | POA: Diagnosis not present

## 2019-01-20 DIAGNOSIS — N202 Calculus of kidney with calculus of ureter: Secondary | ICD-10-CM | POA: Diagnosis not present

## 2019-01-20 DIAGNOSIS — N319 Neuromuscular dysfunction of bladder, unspecified: Secondary | ICD-10-CM | POA: Diagnosis not present

## 2019-01-30 ENCOUNTER — Ambulatory Visit (INDEPENDENT_AMBULATORY_CARE_PROVIDER_SITE_OTHER): Payer: Medicare Other | Admitting: Family Medicine

## 2019-01-30 ENCOUNTER — Other Ambulatory Visit: Payer: Self-pay

## 2019-01-30 ENCOUNTER — Encounter: Payer: Self-pay | Admitting: Family Medicine

## 2019-01-30 VITALS — BP 122/80 | HR 86 | Temp 97.6°F | Ht 63.0 in | Wt 125.2 lb

## 2019-01-30 DIAGNOSIS — E039 Hypothyroidism, unspecified: Secondary | ICD-10-CM

## 2019-01-30 DIAGNOSIS — G6181 Chronic inflammatory demyelinating polyneuritis: Secondary | ICD-10-CM

## 2019-01-30 DIAGNOSIS — J449 Chronic obstructive pulmonary disease, unspecified: Secondary | ICD-10-CM

## 2019-01-30 DIAGNOSIS — K5909 Other constipation: Secondary | ICD-10-CM

## 2019-01-30 DIAGNOSIS — G4710 Hypersomnia, unspecified: Secondary | ICD-10-CM

## 2019-01-30 DIAGNOSIS — I10 Essential (primary) hypertension: Secondary | ICD-10-CM | POA: Diagnosis not present

## 2019-01-30 DIAGNOSIS — K219 Gastro-esophageal reflux disease without esophagitis: Secondary | ICD-10-CM

## 2019-01-30 DIAGNOSIS — M4802 Spinal stenosis, cervical region: Secondary | ICD-10-CM | POA: Diagnosis not present

## 2019-01-30 NOTE — Progress Notes (Signed)
[name] is a 85 y.o. female  Chief Complaint  Patient presents with  . Establish Care    est care/ no complaints/ pt waiting to get at pulmonology in oct    HPI: [name] is a 85 y.o. female here with her husband to establish care with our office. She is a retired Marine scientist. She has 3 grown children.  She has no acute issues or concerns today.  Pt has a complex medical history and follows with multiple specialists. Pt states she is currently "doing pretty well". Her medical history is significant for, but not limited to, CIDP, COPD, hypertension, hyperlipidemia, cervical stenosis, polyarthritis, GERD, history of gastroparesis, chronic constipation with pelvic floor dysfunction, hypothyroidism, and seizures.    Specialists: neuro and neurosurg (both at [facility]), derm (Dr. Magda Kiel), GI (Dr. Hilarie Fredrickson w/ [facility]), pulm (Dr. Verdie Mosher), urology  Last colonoscopy - 2016 (normal), pt has h/o colon polyps s/p Rt hemicolectomy GYN - s/p TAH  She does not need any medication refills today. Pt does take dextroamphetamine 10mg  QID and has done so for years. She understands need to f/u q60mo for medication check/refill and is agreeable to this.   She plans to get flu vaccine with pulm at appt in 02/2019.  Past Medical History:  Diagnosis Date  . Allergic rhinitis   . Anemia   . Arthritis   . Asthma -COPD   . Blood transfusion 1957  . Breast mass    right breast in milk duct, bx neg  . Chronic fatigue syndrome   . Chronic inflammatory demyelinating polyneuropathy ([place]) 03/2011  . chronic sinusitis 08/08/2012  . Colon polyp   . Constipation   . COPD (chronic obstructive pulmonary disease) ([place])   . Cystocele 09/16/2012  . GERD (gastroesophageal reflux disease)   . Hyperlipidemia   . Hypertension   . Hypothyroid   . Neurogenic bladder 2013   husband caths pt, manages with timed void  . Osteoporosis   . Pulmonary embolism ([place])    1980s  . Renal cyst    Non-complex, contrast MRI  2013 with L>R non-complex cysts, dominant 3.7 left lower pole  . Seizures ([place])    1990 last seizures on meds Phenobarb  . Shingles late 58's  . Skin cancer    squamous cell  . Thyroid nodule    Right thyroid lobe, only seen on Sagittal imaging measures 2.3 cm in craniocaudal dimension and appears stable  . Tubular adenoma   . Vitamin D deficiency     Past Surgical History:  Procedure Laterality Date  . ABDOMINAL HYSTERECTOMY    . ANTERIOR FUSION CLIVUS-C2 EXTRAORAL W/ ODONTOID EXCISION  8/14  . APPENDECTOMY    . BASAL CELL CARCINOMA EXCISION     face  . BLADDER SUSPENSION    . BREAST DUCTAL SYSTEM EXCISION Right 01/15/2014   Procedure: EXCISION DUCTAL SYSTEM RIGHT BREAST;  Surgeon: Stark Klein, MD;  Location: [facility];  Service: General;  Laterality: Right;  . BREAST EXCISIONAL BIOPSY Right   . CARPAL TUNNEL RELEASE     right  . CATARACT EXTRACTION     bilateral  . cervical neck ablation     x 7, C3-C6/3 screws and plate  . CESAREAN SECTION    . CYSTOCELE REPAIR    . DILATION AND CURETTAGE OF UTERUS    . duptyren's contracture right hand    . FINGER ARTHRODESIS Right 05/06/2015   Procedure: RIGHT RING PROXIMAL INTERPHALANGEAL FUSION (PIP);  Surgeon: Leanora Cover,  MD;  Location: [facility];  Service: Orthopedics;  Laterality: Right;  . FINGER GANGLION CYST EXCISION     right  . HEMICOLECTOMY Right   . JOINT REPLACEMENT     right and left basal joints of thumbs  . left finger fusion     3 fingers on left hand/one finger right  . left ovary and tube removed    . NASAL POLYP SURGERY     4 sinus surgeries  . NASAL SEPTUM SURGERY    . PANNICULECTOMY    . PROXIMAL INTERPHALANGEAL FUSION (PIP) Left 09/09/2013   Procedure: FUSION LEFT INDEX PROXIMAL INTERPHALANGEAL JOINT (PIP);  Surgeon: Cammie Sickle., MD;  Location: [facility];  Service: Orthopedics;  Laterality: Left;  . right median nerve decompression    . SHOULDER  ARTHROSCOPY     x2 left, 1 right  . sinus surgeries     x 4  . squamous lesions removed     neck and face  . STERIOD INJECTION Right 05/06/2015   Procedure: STEROID INJECTION;  Surgeon: Leanora Cover, MD;  Location: [facility];  Service: Orthopedics;  Laterality: Right;  Right Index Finger Proximal InterPhalangeal Joint Injection  . TONSILLECTOMY AND ADENOIDECTOMY    . TUBAL LIGATION    . vocal polyps removed      Social History   Socioeconomic History  . Marital status: Married    Spouse name: Not on file  . Number of children: 3  . Years of education: Not on file  . Highest education level: Not on file  Occupational History  . Occupation: retired, Recruitment consultant: [employer]  . Financial resource strain: Not on file  . Food insecurity    Worry: Not on file    Inability: Not on file  . Transportation needs    Medical: Not on file    Non-medical: Not on file  Tobacco Use  . Smoking status: Former Smoker    Packs/day: 0.50    Years: 3.00    Pack years: 1.50    Types: Cigarettes    Quit date: 05/29/1978    Years since quitting: 40.7  . Smokeless tobacco: Never Used  . Tobacco comment: quit smoking 35 years ago  Substance and Sexual Activity  . Alcohol use: No  . Drug use: No  . Sexual activity: Yes  Lifestyle  . Physical activity    Days per week: Not on file    Minutes per session: Not on file  . Stress: Not on file  Relationships  . Social Herbalist on phone: Not on file    Gets together: Not on file    Attends religious service: Not on file    Active member of club or organization: Not on file    Attends meetings of clubs or organizations: Not on file    Relationship status: Not on file  . Intimate partner violence    Fear of current or ex partner: Not on file    Emotionally abused: Not on file    Physically abused: Not on file    Forced sexual activity: Not on file  Other Topics Concern  . Not on file   Social History Narrative   Pt lives at home with her husband. She is originally from [place]. Moved to [place] in 2005.     3 grown children - Son is a Pharmacist, community in the area, son in [place],  daughter in [place]   Pt is a retired Marine scientist    Family History  Problem Relation Age of Onset  . Heart disease Mother   . Hyperlipidemia Mother        Jerilynn Mages, aunt  . Ovarian cancer Sister   . Lung cancer Sister        lung - stage 1  . Thyroid cancer Sister   . Colon cancer Father        72  . Malignant hyperthermia Cousin   . Stomach cancer Other        first cousin  . Skin cancer Daughter   . Breast cancer Neg Hx      Immunization History  Administered Date(s) Administered  . Influenza Split 12/27/2012  . Influenza Whole 01/28/2011  . Influenza,inj,Quad PF,6+ Mos 03/16/2014, 02/17/2015  . Influenza-Unspecified 02/12/2012  . Pneumococcal Conjugate-13 12/11/2013  . Pneumococcal Polysaccharide-23 05/29/2006  . Tdap 04/11/2011, 10/14/2018  . Zoster 04/14/2014    Outpatient Encounter Medications as of 01/30/2019  Medication Sig  . amLODipine (NORVASC) 10 MG tablet Take 10 mg by mouth daily.  Marland Kitchen arformoterol (BROVANA) 15 MCG/2ML NEBU Take 2 mLs (15 mcg total) by nebulization 2 (two) times daily.  Marland Kitchen atorvastatin (LIPITOR) 40 MG tablet Take 1 tablet (40 mg total) by mouth daily at 12 noon.  . budesonide (PULMICORT) 0.25 MG/2ML nebulizer solution Take 2 mLs (0.25 mg total) by nebulization 2 (two) times daily.  Marland Kitchen desonide (DESOWEN) 0.05 % cream Apply to affected areas twice daily..  . dextroamphetamine (DEXTROSTAT) 10 MG tablet Take 1 tablet (10 mg total) by mouth 4 (four) times daily.  . diclofenac sodium (VOLTAREN) 1 % GEL Apply 1 application topically as needed.  . famotidine (PEPCID) 20 MG tablet Take 20 mg by mouth 2 (two) times daily.  Marland Kitchen GABAPENTIN PO Take 300 mg by mouth. 1 at 1:00 pm for nerve/muscle pain in legs  . levothyroxine (SYNTHROID, LEVOTHROID) 25 MCG tablet Take 1.5 tablets (37.5 mcg  total) by mouth daily before breakfast.  . mometasone (NASONEX) 50 MCG/ACT nasal spray PLACE 2 SPRAYS INTO THE NOSE DAILY  . montelukast (SINGULAIR) 10 MG tablet Take 1 tablet (10 mg total) by mouth at bedtime.  . mupirocin ointment (BACTROBAN) 2 % ADD ONE INCH OF OINTMENT TO 8 OUNCES OF SINUS RINSE BOTTLE DAILY  . NON FORMULARY Salon pas pain patches/ PRN  . PHENobarbital (LUMINAL) 32.4 MG tablet TAKE 1 TABLET BY MOUTH DAILY AT 6 AM AND NOON AND TAKE 2 TABLETS BY MOUTH AT 8 PM  . polyethylene glycol powder (GLYCOLAX/MIRALAX) powder Take 3/4 capful dissolved in at least 8 ounces water/juice once daily. May titrate as needed  . predniSONE (DELTASONE) 20 MG tablet Take 1 tablet (20 mg total) by mouth daily.  . TEGRETOL-XR 100 MG 12 hr tablet Take 500 mg by mouth daily.   . [DISCONTINUED] amLODipine (NORVASC) 10 MG tablet Take 1 tablet (10 mg total) by mouth daily.  . [DISCONTINUED] calcium carbonate (OS-CAL) 600 MG TABS tablet Take 600 mg by mouth.  Marland Kitchen albuterol (PROAIR HFA) 108 (90 BASE) MCG/ACT inhaler Inhale 1 puff into the lungs as needed. (Patient not taking: Reported on 01/30/2019)  . denosumab (PROLIA) 60 MG/ML SOLN injection Inject 60 mg into the skin every 6 (six) months.  . [DISCONTINUED] Ascorbic Acid (VITAMIN C PO) Take 2 capsules by mouth daily.  . [DISCONTINUED] CALCIUM-VITAMIN D PO Take 1 tablet by mouth daily.    No facility-administered encounter medications on file as of  01/30/2019.      ROS: Denies fever, chills. + fatigue, + muscle aches, + constipation. No heartburn, n/v/d, abd pain. No chest pain, increased SOB. No headache, dizziness. No dysuria.    Allergies  Allergen Reactions  . Iodinated Diagnostic Agents Anaphylaxis    Cardiac arrest  . Iodine Other (See Comments)    Cardiac arrest As young adult, received iodinated contrast for IVP, went into cardiac arrest, was resuscitated and hospitalized. Has not received iodinated contrast since that episode. Reports no  adverse reaction to betadine.   . Morphine Other (See Comments)    Cardiac arrest Patient has previously tolerated hydrocodone, hydromorphone and fentanyl   . Morphine And Related Other (See Comments)    Cardiac arrest  . Metrizamide Other (See Comments)    Cardiac arrest Cardiac arrest     BP 122/80   Pulse 86   Temp 97.6 F (36.4 C) (Oral)   Ht 5\' 3"  (1.6 m)   Wt 125 lb 3.2 oz (56.8 kg)   SpO2 96%   BMI 22.18 kg/m   Physical Exam  Constitutional: She is oriented to person, place, and time. Vital signs are normal. She is cooperative. No distress.  Cardiovascular: Normal rate, regular rhythm and normal heart sounds.  Pulmonary/Chest: Effort normal. No respiratory distress.  Musculoskeletal:        General: No edema.  Neurological: She is alert and oriented to person, place, and time.  Skin: Skin is warm and dry.  Psychiatric: She has a normal mood and affect. Her behavior is normal.     A/P:  1. Essential hypertension - at goal, well-controlled - cont current med - norvasc 10mg  daily - f/u in 3 mo, will check BMP at that time  2. Chronic obstructive pulmonary disease, unspecified COPD type ([place]) - follows with pulm, next appt in 02/2019 - currently stable, at baseline - cont current meds  3. Chronic constipation - follows annually with GI - at baseline - cont current bowel regimen w/ miralax  4. Gastroesophageal reflux disease, esophagitis presence not specified - follows annually with GI - well-controlled - cont current med - pepcid 20mg  BID  5. Hypothyroidism, unspecified type - cont current med - f/u in 3 mo and will check TFTs at that tim  6. CIDP (chronic inflammatory demyelinating polyneuropathy) , Ataxia, Imbalance -- f/u at [place] - follows [facility] with [facility] at [facility] - cont prednisone 20mg  daily  7. Cervical spinal stenosis - s/p multiple surgeries, follows with neurosurg at [facility]  8. Hypersomnia - on dextroamphetamine x years, PCP has been  filling and I am agreeable to doing the same - pt will need q26mo appts for med check/refill, annual UDS, controlled substance agreement - pt and husband expressed understanding and agreement  Pt will RTO in 3 mo for CPE, labs, med refills

## 2019-02-04 ENCOUNTER — Telehealth: Payer: Self-pay | Admitting: Family Medicine

## 2019-02-04 DIAGNOSIS — G4710 Hypersomnia, unspecified: Secondary | ICD-10-CM

## 2019-02-04 MED ORDER — DEXTROAMPHETAMINE SULFATE 10 MG PO TABS: 10.0000 mg | ORAL_TABLET | Freq: Four times a day (QID) | ORAL | 0 refills | Status: DC

## 2019-02-04 NOTE — Telephone Encounter (Signed)
Rx sent. PMP database review, Rx last filled 01/03/2019, #120, no refills by previous pcp: Micheal Richard Durans, PA No red flags

## 2019-02-04 NOTE — Telephone Encounter (Signed)
Patient husband came into the office needing  a Rx refill for dextroamphetamine. Please send Rx refill to [facility] in [place], [place]. Please call patient or pt husband once Rx has been sent to pharmacy.

## 2019-02-05 NOTE — Telephone Encounter (Signed)
Unable to leave vm, need to inform of message below.

## 2019-02-06 NOTE — Telephone Encounter (Signed)
[name] is aware.

## 2019-02-07 ENCOUNTER — Telehealth: Payer: Self-pay | Admitting: Family Medicine

## 2019-02-07 NOTE — Telephone Encounter (Signed)
Pt husband came into office requesting rx refill on phenobarbital. Please send rx to [place], [place]. Please call patient when rx refill sent.

## 2019-02-10 ENCOUNTER — Other Ambulatory Visit: Payer: Self-pay

## 2019-02-10 MED ORDER — PHENOBARBITAL 32.4 MG PO TABS: ORAL_TABLET | ORAL | 0 refills | Status: DC

## 2019-02-10 NOTE — Telephone Encounter (Signed)
Pt aware of 30 day refill.

## 2019-02-10 NOTE — Telephone Encounter (Signed)
Dr. Zigmund Daniel please advise on absence of Dr. Bryan Lemma. Pt husband was requesting refill on phenobarbital. Looks like this was sent directly to Dr. Bland Span to send this in?

## 2019-02-10 NOTE — Telephone Encounter (Signed)
Ok to fill x30 days if she is out.   Dr. Loletha Grayer may want her neurologist to manage this though.

## 2019-02-12 NOTE — Telephone Encounter (Signed)
I am ok to refill/manage this med going forward so pt can request refills from me. Just FYI

## 2019-02-17 ENCOUNTER — Other Ambulatory Visit: Payer: Self-pay

## 2019-02-17 ENCOUNTER — Ambulatory Visit
Admission: RE | Admit: 2019-02-17 | Discharge: 2019-02-17 | Disposition: A | Discharge status: Home / Self Care | Payer: Medicare Other | Source: Ambulatory Visit | Attending: General Surgery | Admitting: General Surgery

## 2019-02-17 DIAGNOSIS — Z1231 Encounter for screening mammogram for malignant neoplasm of breast: Secondary | ICD-10-CM

## 2019-03-10 ENCOUNTER — Other Ambulatory Visit: Payer: Self-pay | Admitting: Nurse Practitioner

## 2019-03-10 DIAGNOSIS — G4710 Hypersomnia, unspecified: Secondary | ICD-10-CM

## 2019-03-10 NOTE — Telephone Encounter (Signed)
Dr. Zigmund Daniel please advise in absence of Dr. Loletha Grayer.   Last refill for this med was 02/04/2019 for 120 tab, last ov with Dr. Loletha Grayer was 01/30/2019--due in 3 mo. PMP checked last 3 pick up from [facility] for 120 tab was: 02/04/2019, 01/03/2019 and 11/28/2018.

## 2019-03-12 ENCOUNTER — Other Ambulatory Visit: Payer: Self-pay

## 2019-03-13 ENCOUNTER — Ambulatory Visit (INDEPENDENT_AMBULATORY_CARE_PROVIDER_SITE_OTHER): Payer: Medicare Other

## 2019-03-13 ENCOUNTER — Encounter: Payer: Self-pay | Admitting: Family Medicine

## 2019-03-13 DIAGNOSIS — Z23 Encounter for immunization: Secondary | ICD-10-CM

## 2019-03-13 NOTE — Progress Notes (Signed)
Pt came into the office for high dose flu shot today, injection given to the left deltoid, pt tolerated injection well, gave information to the pt.

## 2019-03-17 ENCOUNTER — Other Ambulatory Visit: Payer: Self-pay

## 2019-03-17 MED ORDER — LEVOTHYROXINE SODIUM 25 MCG PO TABS: 25.0000 ug | ORAL_TABLET | Freq: Every day | ORAL | 2 refills | Status: DC

## 2019-03-27 ENCOUNTER — Other Ambulatory Visit: Payer: Self-pay | Admitting: Family Medicine

## 2019-03-27 ENCOUNTER — Telehealth: Payer: Self-pay | Admitting: Family Medicine

## 2019-03-27 MED ORDER — PHENOBARBITAL 32.4 MG PO TABS: ORAL_TABLET | ORAL | 0 refills | Status: DC

## 2019-03-27 NOTE — Telephone Encounter (Signed)
Patient husband came in saying that the pharmacy sent over a refill request for Phenobarbital (Lumunal) 32.4mg . Patient wanted to get this refill before the weekend. Pharmacy is [facility] in [place]. Please advise.

## 2019-03-27 NOTE — Telephone Encounter (Signed)
Rx refilled to pharm

## 2019-04-04 DIAGNOSIS — M19032 Primary osteoarthritis, left wrist: Secondary | ICD-10-CM | POA: Diagnosis not present

## 2019-04-04 DIAGNOSIS — M1812 Unilateral primary osteoarthritis of first carpometacarpal joint, left hand: Secondary | ICD-10-CM | POA: Diagnosis not present

## 2019-04-15 DIAGNOSIS — L943 Sclerodactyly: Secondary | ICD-10-CM | POA: Diagnosis not present

## 2019-04-15 DIAGNOSIS — I7300 Raynaud's syndrome without gangrene: Secondary | ICD-10-CM | POA: Diagnosis not present

## 2019-04-16 ENCOUNTER — Other Ambulatory Visit: Payer: Self-pay | Admitting: Family Medicine

## 2019-04-16 DIAGNOSIS — R0602 Shortness of breath: Secondary | ICD-10-CM | POA: Diagnosis not present

## 2019-04-16 DIAGNOSIS — G6181 Chronic inflammatory demyelinating polyneuritis: Secondary | ICD-10-CM | POA: Diagnosis not present

## 2019-04-16 DIAGNOSIS — J449 Chronic obstructive pulmonary disease, unspecified: Secondary | ICD-10-CM | POA: Diagnosis not present

## 2019-04-16 DIAGNOSIS — G4710 Hypersomnia, unspecified: Secondary | ICD-10-CM

## 2019-04-16 DIAGNOSIS — J479 Bronchiectasis, uncomplicated: Secondary | ICD-10-CM | POA: Diagnosis not present

## 2019-04-16 MED ORDER — DEXTROAMPHETAMINE SULFATE 10 MG PO TABS: 10.0000 mg | ORAL_TABLET | Freq: Four times a day (QID) | ORAL | 0 refills | Status: DC

## 2019-04-30 ENCOUNTER — Other Ambulatory Visit: Payer: Self-pay

## 2019-05-01 ENCOUNTER — Ambulatory Visit (INDEPENDENT_AMBULATORY_CARE_PROVIDER_SITE_OTHER): Payer: Medicare Other | Admitting: Family Medicine

## 2019-05-01 ENCOUNTER — Encounter: Payer: Self-pay | Admitting: Family Medicine

## 2019-05-01 VITALS — BP 102/62 | HR 88 | Temp 98.1°F | Ht 63.0 in | Wt 123.1 lb

## 2019-05-01 DIAGNOSIS — E039 Hypothyroidism, unspecified: Secondary | ICD-10-CM | POA: Diagnosis not present

## 2019-05-01 DIAGNOSIS — R569 Unspecified convulsions: Secondary | ICD-10-CM

## 2019-05-01 DIAGNOSIS — Z23 Encounter for immunization: Secondary | ICD-10-CM | POA: Diagnosis not present

## 2019-05-01 DIAGNOSIS — I10 Essential (primary) hypertension: Secondary | ICD-10-CM | POA: Diagnosis not present

## 2019-05-01 DIAGNOSIS — E559 Vitamin D deficiency, unspecified: Secondary | ICD-10-CM | POA: Diagnosis not present

## 2019-05-01 DIAGNOSIS — G4710 Hypersomnia, unspecified: Secondary | ICD-10-CM | POA: Diagnosis not present

## 2019-05-01 DIAGNOSIS — E785 Hyperlipidemia, unspecified: Secondary | ICD-10-CM

## 2019-05-01 DIAGNOSIS — E538 Deficiency of other specified B group vitamins: Secondary | ICD-10-CM | POA: Diagnosis not present

## 2019-05-01 MED ORDER — DEXTROAMPHETAMINE SULFATE 10 MG PO TABS: 10.0000 mg | ORAL_TABLET | Freq: Four times a day (QID) | ORAL | 0 refills | Status: DC

## 2019-05-01 NOTE — Progress Notes (Signed)
[name] is a 85 y.o. female  Chief Complaint  Patient presents with  . 3 month follow up    HPI: [name] is a 85 y.o. female accompanied by her husband for f/u on chronic medical issues and medication refills.  Pt has a lengthy and complex medical history to include chronic inflammatory demyelinating polyneuropathy, neurogenic bladded, COPD, chronic fatigue, HTN, hypothyroidism, hyperlipidemia, h/o seizures.  She had her flu vaccine this season and is UTD on her Tdap. She has had both PCV 13 and PSV 23 vaccines, last in 2015. She had zostavax but not shingrix vaccine.  Pt fell in 09/2018 - required 5 staples. No falls since then. Pt states balance has been off x 6-8 mo.  She saw pulmonary in [facility] for routine appt. She also saw Dr. Manuella Ghazi (rheum) at [facility] who does not think pt has scleroderma and fingers are due to vascular issue and finger fusion.  She had appt with [facility] neuro this month for q85mo f/u  Past Medical History:  Diagnosis Date  . Allergic rhinitis   . Anemia   . Arthritis   . Asthma -COPD   . Blood transfusion 1957  . Breast mass    right breast in milk duct, bx neg  . Chronic fatigue syndrome   . Chronic inflammatory demyelinating polyneuropathy ([place]) 03/2011  . chronic sinusitis 08/08/2012  . Colon polyp   . Constipation   . COPD (chronic obstructive pulmonary disease) ([place])   . Cystocele 09/16/2012  . GERD (gastroesophageal reflux disease)   . Hyperlipidemia   . Hypertension   . Hypothyroid   . Neurogenic bladder 2013   husband caths pt, manages with timed void  . Osteoporosis   . Pulmonary embolism ([place])    1980s  . Renal cyst    Non-complex, contrast MRI 2013 with L>R non-complex cysts, dominant 3.7 left lower pole  . Seizures ([place])    1990 last seizures on meds Phenobarb  . Shingles late 52's  . Skin cancer    squamous cell  . Thyroid nodule    Right thyroid lobe, only seen on Sagittal imaging measures 2.3 cm in  craniocaudal dimension and appears stable  . Tubular adenoma   . Vitamin D deficiency     Past Surgical History:  Procedure Laterality Date  . ABDOMINAL HYSTERECTOMY    . ANTERIOR FUSION CLIVUS-C2 EXTRAORAL W/ ODONTOID EXCISION  8/14  . APPENDECTOMY    . BASAL CELL CARCINOMA EXCISION     face  . BLADDER SUSPENSION    . BREAST DUCTAL SYSTEM EXCISION Right 01/15/2014   Procedure: EXCISION DUCTAL SYSTEM RIGHT BREAST;  Surgeon: Stark Klein, MD;  Location: [place];  Service: General;  Laterality: Right;  . BREAST EXCISIONAL BIOPSY Right   . CARPAL TUNNEL RELEASE     right  . CATARACT EXTRACTION     bilateral  . cervical neck ablation     x 7, C3-C6/3 screws and plate  . CESAREAN SECTION    . CYSTOCELE REPAIR    . DILATION AND CURETTAGE OF UTERUS    . duptyren's contracture right hand    . FINGER ARTHRODESIS Right 05/06/2015   Procedure: RIGHT RING PROXIMAL INTERPHALANGEAL FUSION (PIP);  Surgeon: Leanora Cover, MD;  Location: [place];  Service: Orthopedics;  Laterality: Right;  . FINGER GANGLION CYST EXCISION     right  . HEMICOLECTOMY Right   . JOINT REPLACEMENT     right and left  basal joints of thumbs  . left finger fusion     3 fingers on left hand/one finger right  . left ovary and tube removed    . NASAL POLYP SURGERY     4 sinus surgeries  . NASAL SEPTUM SURGERY    . PANNICULECTOMY    . PROXIMAL INTERPHALANGEAL FUSION (PIP) Left 09/09/2013   Procedure: FUSION LEFT INDEX PROXIMAL INTERPHALANGEAL JOINT (PIP);  Surgeon: Cammie Sickle., MD;  Location: [facility];  Service: Orthopedics;  Laterality: Left;  . right median nerve decompression    . SHOULDER ARTHROSCOPY     x2 left, 1 right  . sinus surgeries     x 4  . squamous lesions removed     neck and face  . STERIOD INJECTION Right 05/06/2015   Procedure: STEROID INJECTION;  Surgeon: Leanora Cover, MD;  Location: [place];  Service: Orthopedics;   Laterality: Right;  Right Index Finger Proximal InterPhalangeal Joint Injection  . TONSILLECTOMY AND ADENOIDECTOMY    . TUBAL LIGATION    . vocal polyps removed      Social History   Socioeconomic History  . Marital status: Married    Spouse name: Not on file  . Number of children: 3  . Years of education: Not on file  . Highest education level: Not on file  Occupational History  . Occupation: retired, Recruitment consultant: [employer]  . Financial resource strain: Not on file  . Food insecurity    Worry: Not on file    Inability: Not on file  . Transportation needs    Medical: Not on file    Non-medical: Not on file  Tobacco Use  . Smoking status: Former Smoker    Packs/day: 0.50    Years: 3.00    Pack years: 1.50    Types: Cigarettes    Quit date: 05/29/1978    Years since quitting: 40.9  . Smokeless tobacco: Never Used  . Tobacco comment: quit smoking 35 years ago  Substance and Sexual Activity  . Alcohol use: No  . Drug use: No  . Sexual activity: Yes  Lifestyle  . Physical activity    Days per week: Not on file    Minutes per session: Not on file  . Stress: Not on file  Relationships  . Social Herbalist on phone: Not on file    Gets together: Not on file    Attends religious service: Not on file    Active member of club or organization: Not on file    Attends meetings of clubs or organizations: Not on file    Relationship status: Not on file  . Intimate partner violence    Fear of current or ex partner: Not on file    Emotionally abused: Not on file    Physically abused: Not on file    Forced sexual activity: Not on file  Other Topics Concern  . Not on file  Social History Narrative   Pt lives at home with her husband. She is originally from [place]. Moved to [place] in 2005.     3 grown children - Son is a Pharmacist, community in the area, son in [place], daughter in [place]   Pt is a retired Marine scientist    Family History  Problem Relation Age  of Onset  . Heart disease Mother   . Hyperlipidemia Mother        Jerilynn Mages, aunt  .  Ovarian cancer Sister   . Lung cancer Sister        lung - stage 1  . Thyroid cancer Sister   . Colon cancer Father        59  . Malignant hyperthermia Cousin   . Stomach cancer Other        first cousin  . Skin cancer Daughter   . Breast cancer Neg Hx      Immunization History  Administered Date(s) Administered  . Fluad Quad(high Dose 65+) 03/13/2019  . Influenza Split 12/27/2012  . Influenza Whole 01/28/2011  . Influenza,inj,Quad PF,6+ Mos 03/16/2014, 02/17/2015  . Influenza-Unspecified 02/12/2012, 02/05/2015  . Pneumococcal Conjugate-13 12/11/2013  . Pneumococcal Polysaccharide-23 05/29/2006  . Tdap 04/11/2011, 10/14/2018  . Zoster 04/14/2014    Outpatient Encounter Medications as of 05/01/2019  Medication Sig  . amLODipine (NORVASC) 10 MG tablet Take 10 mg by mouth daily.  Marland Kitchen arformoterol (BROVANA) 15 MCG/2ML NEBU Take 2 mLs (15 mcg total) by nebulization 2 (two) times daily.  Marland Kitchen atorvastatin (LIPITOR) 40 MG tablet Take 1 tablet (40 mg total) by mouth daily at 12 noon.  . budesonide (PULMICORT) 0.25 MG/2ML nebulizer solution Take 2 mLs (0.25 mg total) by nebulization 2 (two) times daily.  Marland Kitchen desonide (DESOWEN) 0.05 % cream Apply to affected areas twice daily..  . dextroamphetamine (DEXTROSTAT) 10 MG tablet Take 1 tablet (10 mg total) by mouth 4 (four) times daily.  . diclofenac sodium (VOLTAREN) 1 % GEL Apply 1 application topically as needed.  . famotidine (PEPCID) 20 MG tablet Take 20 mg by mouth 2 (two) times daily.  Marland Kitchen GABAPENTIN PO Take 300 mg by mouth. 1 at 1:00 pm for nerve/muscle pain in legs  . levothyroxine (SYNTHROID) 25 MCG tablet Take 1 tablet (25 mcg total) by mouth daily before breakfast.  . mometasone (NASONEX) 50 MCG/ACT nasal spray PLACE 2 SPRAYS INTO THE NOSE DAILY  . montelukast (SINGULAIR) 10 MG tablet Take 1 tablet (10 mg total) by mouth at bedtime.  . mupirocin ointment  (BACTROBAN) 2 % ADD ONE INCH OF OINTMENT TO 8 OUNCES OF SINUS RINSE BOTTLE DAILY  . NON FORMULARY Salon pas pain patches/ PRN  . PHENobarbital (LUMINAL) 32.4 MG tablet TAKE 1 TABLET BY MOUTH DAILY AT 6 AM AND NOON AND TAKE 2 TABLETS BY MOUTH AT 8 PM  . polyethylene glycol powder (GLYCOLAX/MIRALAX) powder Take 3/4 capful dissolved in at least 8 ounces water/juice once daily. May titrate as needed  . predniSONE (DELTASONE) 20 MG tablet Take 1 tablet (20 mg total) by mouth daily.  . TEGRETOL-XR 100 MG 12 hr tablet Take 500 mg by mouth daily.   . [DISCONTINUED] denosumab (PROLIA) 60 MG/ML SOLN injection Inject 60 mg into the skin every 6 (six) months.   No facility-administered encounter medications on file as of 05/01/2019.      ROS: Gen: no fever, chills  Skin: no rash, itching ENT: no ear pain, ear drainage, nasal congestion, rhinorrhea, sinus pressure, sore throat Eyes: no blurry vision, double vision Resp: no cough, wheeze, increased SOB CV: no CP, palpitations, LE edema GI: no heartburn, n/v/d/c, abd pain MSK: no joint pain, myalgias, back pain Neuro: no dizziness, headache    Allergies  Allergen Reactions  . Iodinated Diagnostic Agents Anaphylaxis    Cardiac arrest  . Iodine Other (See Comments)    Cardiac arrest As young adult, received iodinated contrast for IVP, went into cardiac arrest, was resuscitated and hospitalized. Has not received iodinated contrast since that episode.  Reports no adverse reaction to betadine.   . Morphine Other (See Comments)    Cardiac arrest Patient has previously tolerated hydrocodone, hydromorphone and fentanyl   . Morphine And Related Other (See Comments)    Cardiac arrest  . Metrizamide Other (See Comments)    Cardiac arrest Cardiac arrest     BP 102/62   Pulse 88   Temp 98.1 F (36.7 C)   Ht 5\' 3"  (1.6 m)   Wt 123 lb 1.6 oz (55.8 kg)   SpO2 96%   BMI 21.81 kg/m   BP Readings from Last 3 Encounters:  05/01/19 102/62  01/30/19  122/80  10/14/18 (!) 152/74   Pulse Readings from Last 3 Encounters:  05/01/19 88  01/30/19 86  10/14/18 95   Wt Readings from Last 3 Encounters:  05/01/19 123 lb 1.6 oz (55.8 kg)  01/30/19 125 lb 3.2 oz (56.8 kg)  10/14/18 130 lb (59 kg)     Physical Exam  Constitutional: She is oriented to person, place, and time. She appears well-developed and well-nourished. No distress.  Neck: Neck supple.  Cardiovascular: Normal rate and regular rhythm.  Pulmonary/Chest: Effort normal and breath sounds normal. No respiratory distress.  Abdominal: Soft. Bowel sounds are normal. She exhibits no distension. There is no abdominal tenderness.  Musculoskeletal:        General: No edema.  Lymphadenopathy:    She has no cervical adenopathy.  Neurological: She is alert and oriented to person, place, and time.  Skin: Skin is warm and dry.  Psychiatric: She has a normal mood and affect. Her behavior is normal.     A/P:  1. Seizures ([place]) - very well-controlled, no seizures in 30 years - Phenobarbital level - cont current meds and q66mo f/u with neurology  2. Vitamin D deficiency - not on Vit D supplement - VITAMIN D 25 Hydroxy (Vit-D Deficiency, Fractures)  3. B12 deficiency - received B12 injections in the past, not currently taking B12 supplement - Vitamin B12  4. Hypothyroidism, unspecified type - stable on levothyroxine 21mcg daily - TSH  5. Essential hypertension - controlled, at goal - cont norvasc 10mg  daily - Basic metabolic panel  6. Hyperlipidemia, unspecified hyperlipidemia type - cont lipitor 40mg  daily - Lipid panel - ALT - AST  7. Need for shingles vaccine - Varicella-zoster vaccine IM (Shingrix) - RTO for #2 in 8 wks  8. Hypersomnia-- on dextroamphetamine - chronic, stable on current med/dose - controlled substance database reviewed and appropriate, no concern for abuse/misuse Refill: - dextroamphetamine (DEXTROSTAT) 10 MG tablet; Take 1 tablet (10 mg  total) by mouth 4 (four) times daily.  Dispense: 120 tablet; Refill: 0 - f/u in 3 mo or sooner PRN - ok to be VV at that time  This visit occurred during the SARS-CoV-2 public health emergency.  Safety protocols were in place, including screening questions prior to the visit, additional usage of staff PPE, and extensive cleaning of exam room while observing appropriate contact time as indicated for disinfecting solutions.

## 2019-05-02 LAB — BASIC METABOLIC PANEL
BUN: 23 mg/dL (ref 6–23)
CO2: 27 mEq/L (ref 19–32)
Calcium: 9.2 mg/dL (ref 8.4–10.5)
Chloride: 105 mEq/L (ref 96–112)
Creatinine, Ser: 0.78 mg/dL (ref 0.40–1.20)
GFR: 70.25 mL/min (ref >60.00)
Glucose, Bld: 76 mg/dL (ref 70–99)
Potassium: 4.8 mEq/L (ref 3.5–5.1)
Sodium: 140 mEq/L (ref 135–145)

## 2019-05-02 LAB — TSH: TSH: 2.12 u[IU]/mL (ref 0.35–4.50)

## 2019-05-02 LAB — LIPID PANEL
Cholesterol: 215 mg/dL — ABNORMAL HIGH (ref 0–200)
HDL: 68.6 mg/dL (ref >39.00)
LDL Cholesterol: 132 mg/dL — ABNORMAL HIGH (ref 0–99)
NonHDL: 146.67
Total CHOL/HDL Ratio: 3
Triglycerides: 71 mg/dL (ref 0.0–149.0)
VLDL: 14.2 mg/dL (ref 0.0–40.0)

## 2019-05-02 LAB — VITAMIN D 25 HYDROXY (VIT D DEFICIENCY, FRACTURES): VITD: 19.93 ng/mL — ABNORMAL LOW (ref 30.00–100.00)

## 2019-05-02 LAB — ALT: ALT: 16 U/L (ref 0–35)

## 2019-05-02 LAB — AST: AST: 27 U/L (ref 0–37)

## 2019-05-02 LAB — VITAMIN B12: Vitamin B-12: 574 pg/mL (ref 211–911)

## 2019-05-03 LAB — PHENOBARBITAL LEVEL: Phenobarbital, Serum: 31.9 mg/L (ref 15.0–40.0)

## 2019-05-07 ENCOUNTER — Other Ambulatory Visit: Payer: Self-pay | Admitting: Family Medicine

## 2019-05-07 DIAGNOSIS — E559 Vitamin D deficiency, unspecified: Secondary | ICD-10-CM

## 2019-05-07 MED ORDER — VITAMIN D (ERGOCALCIFEROL) 1.25 MG (50000 UNIT) PO CAPS: 50000.0000 [IU] | ORAL_CAPSULE | ORAL | 2 refills | Status: DC

## 2019-05-15 ENCOUNTER — Other Ambulatory Visit: Payer: Self-pay | Admitting: Family Medicine

## 2019-05-15 DIAGNOSIS — G40409 Other generalized epilepsy and epileptic syndromes, not intractable, without status epilepticus: Secondary | ICD-10-CM | POA: Diagnosis not present

## 2019-05-15 DIAGNOSIS — R252 Cramp and spasm: Secondary | ICD-10-CM | POA: Diagnosis not present

## 2019-05-21 ENCOUNTER — Other Ambulatory Visit: Payer: Self-pay | Admitting: Family Medicine

## 2019-05-21 MED ORDER — AMLODIPINE BESYLATE 10 MG PO TABS: 10.0000 mg | ORAL_TABLET | Freq: Every day | ORAL | 3 refills | Status: DC

## 2019-06-19 ENCOUNTER — Other Ambulatory Visit: Payer: Self-pay | Admitting: Family Medicine

## 2019-06-19 NOTE — Telephone Encounter (Signed)
MC-Plz see refill req/thx dmf 

## 2019-06-24 ENCOUNTER — Telehealth: Payer: Self-pay | Admitting: Family Medicine

## 2019-06-24 DIAGNOSIS — R269 Unspecified abnormalities of gait and mobility: Secondary | ICD-10-CM | POA: Diagnosis not present

## 2019-06-24 DIAGNOSIS — G40909 Epilepsy, unspecified, not intractable, without status epilepticus: Secondary | ICD-10-CM | POA: Diagnosis not present

## 2019-06-24 DIAGNOSIS — G6181 Chronic inflammatory demyelinating polyneuritis: Secondary | ICD-10-CM | POA: Diagnosis not present

## 2019-06-24 NOTE — Telephone Encounter (Signed)
Pt is requesting a copy of last lab results. Please call pt and let her know when they are ready for pick up at [phone].

## 2019-06-24 NOTE — Telephone Encounter (Signed)
I called pt.  No answer or VM set up.  Last labs have been printed off and placed at front desk basket for pt to pick up.

## 2019-07-15 NOTE — Telephone Encounter (Signed)
Pt informed

## 2019-07-18 ENCOUNTER — Other Ambulatory Visit: Payer: Self-pay | Admitting: Family Medicine

## 2019-07-18 DIAGNOSIS — G4710 Hypersomnia, unspecified: Secondary | ICD-10-CM

## 2019-07-18 NOTE — Telephone Encounter (Signed)
Last OV 05/01/19 Last fill 05/01/19  #120/0

## 2019-07-22 DIAGNOSIS — Z85828 Personal history of other malignant neoplasm of skin: Secondary | ICD-10-CM | POA: Diagnosis not present

## 2019-07-22 DIAGNOSIS — L814 Other melanin hyperpigmentation: Secondary | ICD-10-CM | POA: Diagnosis not present

## 2019-07-22 DIAGNOSIS — L940 Localized scleroderma [morphea]: Secondary | ICD-10-CM | POA: Diagnosis not present

## 2019-07-22 DIAGNOSIS — D229 Melanocytic nevi, unspecified: Secondary | ICD-10-CM | POA: Diagnosis not present

## 2019-07-22 DIAGNOSIS — D1801 Hemangioma of skin and subcutaneous tissue: Secondary | ICD-10-CM | POA: Diagnosis not present

## 2019-07-22 DIAGNOSIS — L821 Other seborrheic keratosis: Secondary | ICD-10-CM | POA: Diagnosis not present

## 2019-07-30 ENCOUNTER — Ambulatory Visit (INDEPENDENT_AMBULATORY_CARE_PROVIDER_SITE_OTHER): Payer: Medicare Other | Admitting: Family Medicine

## 2019-07-30 ENCOUNTER — Encounter: Payer: Self-pay | Admitting: Family Medicine

## 2019-07-30 VITALS — BP 120/70 | HR 71 | Temp 97.9°F | Ht 63.0 in | Wt 121.6 lb

## 2019-07-30 DIAGNOSIS — R2689 Other abnormalities of gait and mobility: Secondary | ICD-10-CM

## 2019-07-30 DIAGNOSIS — E559 Vitamin D deficiency, unspecified: Secondary | ICD-10-CM

## 2019-07-30 DIAGNOSIS — G6181 Chronic inflammatory demyelinating polyneuritis: Secondary | ICD-10-CM | POA: Diagnosis not present

## 2019-07-30 DIAGNOSIS — G4710 Hypersomnia, unspecified: Secondary | ICD-10-CM | POA: Diagnosis not present

## 2019-07-30 DIAGNOSIS — E039 Hypothyroidism, unspecified: Secondary | ICD-10-CM | POA: Diagnosis not present

## 2019-07-30 DIAGNOSIS — Z9181 History of falling: Secondary | ICD-10-CM | POA: Diagnosis not present

## 2019-07-30 DIAGNOSIS — R569 Unspecified convulsions: Secondary | ICD-10-CM | POA: Diagnosis not present

## 2019-07-30 DIAGNOSIS — I10 Essential (primary) hypertension: Secondary | ICD-10-CM | POA: Diagnosis not present

## 2019-07-30 DIAGNOSIS — Z23 Encounter for immunization: Secondary | ICD-10-CM

## 2019-07-30 LAB — VITAMIN D 25 HYDROXY (VIT D DEFICIENCY, FRACTURES): VITD: 37.42 ng/mL (ref 30.00–100.00)

## 2019-07-30 MED ORDER — DEXTROAMPHETAMINE SULFATE 10 MG PO TABS: 10.0000 mg | ORAL_TABLET | Freq: Four times a day (QID) | ORAL | 0 refills | Status: DC

## 2019-07-30 MED ORDER — AMLODIPINE BESYLATE 10 MG PO TABS: 10.0000 mg | ORAL_TABLET | Freq: Every day | ORAL | 3 refills | Status: DC

## 2019-07-30 MED ORDER — LEVOTHYROXINE SODIUM 25 MCG PO TABS: 25.0000 ug | ORAL_TABLET | Freq: Every day | ORAL | 3 refills | Status: DC

## 2019-07-30 MED ORDER — PHENOBARBITAL 32.4 MG PO TABS: ORAL_TABLET | ORAL | 3 refills | Status: DC

## 2019-07-30 NOTE — Progress Notes (Addendum)
[name] is a 86 y.o. female  Chief Complaint  Patient presents with  . Follow-up    3 month follow up.. Pt recieved 2nd Covid injection on Feb 8,2021.  Pt is fasting for labs today.   Pt said that [facility] has sent labs for speical testing.    HPI: [name] is a 86 y.o. female here with her husband for regular f/u on chronic medical issues and med refills. She received her COVID vaccine series.   Balance is still a big issue for pt. No falls since last OV. She would like to try PT again - did this years ago.  She follows with neuro at [facility]. Pt has long h/o epilepsy and has been on phenobarbital x decades and carbamazepine x a few years. Results of labs done today should be forwarded to neuro.  Pt does need med refills today - see orders.  No acute issues/concerns.  Past Medical History:  Diagnosis Date  . Allergic rhinitis   . Anemia   . Arthritis   . Asthma -COPD   . Blood transfusion 1957  . Breast mass    right breast in milk duct, bx neg  . Chronic fatigue syndrome   . Chronic inflammatory demyelinating polyneuropathy ([place]) 03/2011  . chronic sinusitis 08/08/2012  . Colon polyp   . Constipation   . COPD (chronic obstructive pulmonary disease) ([place])   . Cystocele 09/16/2012  . GERD (gastroesophageal reflux disease)   . Hyperlipidemia   . Hypertension   . Hypothyroid   . Neurogenic bladder 2013   husband caths pt, manages with timed void  . Osteoporosis   . Pulmonary embolism ([place])    1980s  . Renal cyst    Non-complex, contrast MRI 2013 with L>R non-complex cysts, dominant 3.7 left lower pole  . Seizures ([place])    1990 last seizures on meds Phenobarb  . Shingles late 37's  . Skin cancer    squamous cell  . Thyroid nodule    Right thyroid lobe, only seen on Sagittal imaging measures 2.3 cm in craniocaudal dimension and appears stable  . Tubular adenoma   . Vitamin D deficiency     Past Surgical History:  Procedure Laterality Date  .  ABDOMINAL HYSTERECTOMY    . ANTERIOR FUSION CLIVUS-C2 EXTRAORAL W/ ODONTOID EXCISION  8/14  . APPENDECTOMY    . BASAL CELL CARCINOMA EXCISION     face  . BLADDER SUSPENSION    . BREAST DUCTAL SYSTEM EXCISION Right 01/15/2014   Procedure: EXCISION DUCTAL SYSTEM RIGHT BREAST;  Surgeon: Stark Klein, MD;  Location: [facility];  Service: General;  Laterality: Right;  . BREAST EXCISIONAL BIOPSY Right   . CARPAL TUNNEL RELEASE     right  . CATARACT EXTRACTION     bilateral  . cervical neck ablation     x 7, C3-C6/3 screws and plate  . CESAREAN SECTION    . CYSTOCELE REPAIR    . DILATION AND CURETTAGE OF UTERUS    . duptyren's contracture right hand    . FINGER ARTHRODESIS Right 05/06/2015   Procedure: RIGHT RING PROXIMAL INTERPHALANGEAL FUSION (PIP);  Surgeon: Leanora Cover, MD;  Location: [facility];  Service: Orthopedics;  Laterality: Right;  . FINGER GANGLION CYST EXCISION     right  . HEMICOLECTOMY Right   . JOINT REPLACEMENT     right and left basal joints of thumbs  . left finger fusion     3  fingers on left hand/one finger right  . left ovary and tube removed    . NASAL POLYP SURGERY     4 sinus surgeries  . NASAL SEPTUM SURGERY    . PANNICULECTOMY    . PROXIMAL INTERPHALANGEAL FUSION (PIP) Left 09/09/2013   Procedure: FUSION LEFT INDEX PROXIMAL INTERPHALANGEAL JOINT (PIP);  Surgeon: Cammie Sickle., MD;  Location[facility];  Service: Orthopedics;  Laterality: Left;  . right median nerve decompression    . SHOULDER ARTHROSCOPY     x2 left, 1 right  . sinus surgeries     x 4  . squamous lesions removed     neck and face  . STERIOD INJECTION Right 05/06/2015   Procedure: STEROID INJECTION;  Surgeon: Leanora Cover, MD;  Location: [facility];  Service: Orthopedics;  Laterality: Right;  Right Index Finger Proximal InterPhalangeal Joint Injection  . TONSILLECTOMY AND ADENOIDECTOMY    . TUBAL LIGATION    . vocal polyps  removed      Social History   Socioeconomic History  . Marital status: Married    Spouse name: Not on file  . Number of children: 3  . Years of education: Not on file  . Highest education level: Not on file  Occupational History  . Occupation: retired, Licensed conveyancer    Employer: RETIRED  Tobacco Use  . Smoking status: Former Smoker    Packs/day: 0.50    Years: 3.00    Pack years: 1.50    Types: Cigarettes    Quit date: 05/29/1978    Years since quitting: 41.1  . Smokeless tobacco: Never Used  . Tobacco comment: quit smoking 35 years ago  Substance and Sexual Activity  . Alcohol use: No  . Drug use: No  . Sexual activity: Yes  Other Topics Concern  . Not on file  Social History Narrative   Pt lives at home with her husband. She is originally from [place]. Moved to [place] in 2005.     3 grown children - Son is a Pharmacist, community in the area, son in [place], daughter in [place]   Pt is a retired Marine scientist   Social Determinants of Radio broadcast assistant Strain:   . Difficulty of Paying Living Expenses: Not on file  Food Insecurity:   . Worried About Charity fundraiser in the Last Year: Not on file  . Ran Out of Food in the Last Year: Not on file  Transportation Needs:   . Lack of Transportation (Medical): Not on file  . Lack of Transportation (Non-Medical): Not on file  Physical Activity:   . Days of Exercise per Week: Not on file  . Minutes of Exercise per Session: Not on file  Stress:   . Feeling of Stress : Not on file  Social Connections:   . Frequency of Communication with Friends and Family: Not on file  . Frequency of Social Gatherings with Friends and Family: Not on file  . Attends Religious Services: Not on file  . Active Member of Clubs or Organizations: Not on file  . Attends Archivist Meetings: Not on file  . Marital Status: Not on file  Intimate Partner Violence:   . Fear of Current or Ex-Partner: Not on file  . Emotionally Abused: Not on file  .  Physically Abused: Not on file  . Sexually Abused: Not on file    Family History  Problem Relation Age of Onset  . Heart  disease Mother   . Hyperlipidemia Mother        Jerilynn Mages, aunt  . Ovarian cancer Sister   . Lung cancer Sister        lung - stage 1  . Thyroid cancer Sister   . Colon cancer Father        9  . Malignant hyperthermia Cousin   . Stomach cancer Other        first cousin  . Skin cancer Daughter   . Breast cancer Neg Hx      Immunization History  Administered Date(s) Administered  . Fluad Quad(high Dose 65+) 03/13/2019  . Influenza Split 12/27/2012  . Influenza Whole 01/28/2011  . Influenza,inj,Quad PF,6+ Mos 03/16/2014, 02/17/2015  . Influenza-Unspecified 02/12/2012, 02/05/2015  . Pneumococcal Conjugate-13 12/11/2013  . Pneumococcal Polysaccharide-23 05/29/2006  . Tdap 04/11/2011, 10/14/2018  . Zoster 04/14/2014  . Zoster Recombinat (Shingrix) 05/01/2019    Outpatient Encounter Medications as of 07/30/2019  Medication Sig  . albuterol (PROVENTIL) (2.5 MG/3ML) 0.083% nebulizer solution USE 1 VIAL IN NEBULIZER 4 TIMES DAILY  . amLODipine (NORVASC) 10 MG tablet Take 1 tablet (10 mg total) by mouth daily.  Marland Kitchen arformoterol (BROVANA) 15 MCG/2ML NEBU Take 2 mLs (15 mcg total) by nebulization 2 (two) times daily.  Marland Kitchen atorvastatin (LIPITOR) 40 MG tablet Take 1 tablet (40 mg total) by mouth daily at 12 noon.  . budesonide (PULMICORT) 0.25 MG/2ML nebulizer solution Take 2 mLs (0.25 mg total) by nebulization 2 (two) times daily.  . Cholecalciferol 25 MCG (1000 UT) capsule Take by mouth.  . desonide (DESOWEN) 0.05 % cream Apply to affected areas twice daily..  . dextroamphetamine (DEXTROSTAT) 10 MG tablet TAKE ONE (1) TABLET BY MOUTH FOUR (4) TIMES DAILY  . diclofenac sodium (VOLTAREN) 1 % GEL Apply 1 application topically as needed.  . famotidine (PEPCID) 20 MG tablet Take 20 mg by mouth 2 (two) times daily.  Marland Kitchen gabapentin (NEURONTIN) 300 MG capsule Take 300 mg by mouth at  bedtime.  Marland Kitchen GABAPENTIN PO Take 300 mg by mouth. 1 at 1:00 pm for nerve/muscle pain in legs  . levothyroxine (SYNTHROID) 25 MCG tablet TAKE ONE TABLET BY MOUTH EVERY MORNING BEFORE BREAKFAST  . mometasone (ELOCON) 0.1 % cream Apply topically several times per week. Do not use daily consistently.  . mometasone (NASONEX) 50 MCG/ACT nasal spray PLACE 2 SPRAYS INTO THE NOSE DAILY  . montelukast (SINGULAIR) 10 MG tablet Take 1 tablet (10 mg total) by mouth at bedtime.  . mupirocin ointment (BACTROBAN) 2 % ADD ONE INCH OF OINTMENT TO 8 OUNCES OF SINUS RINSE BOTTLE DAILY  . NON FORMULARY Salon pas pain patches/ PRN  . PHENobarbital (LUMINAL) 32.4 MG tablet TAKE 1 TABLET BY MOUTH DAILY AT 6AM AND 2 TABLETS AT 8PM  . polyethylene glycol powder (GLYCOLAX/MIRALAX) powder Take 3/4 capful dissolved in at least 8 ounces water/juice once daily. May titrate as needed  . predniSONE (DELTASONE) 20 MG tablet Take 1 tablet (20 mg total) by mouth daily.  . TEGRETOL-XR 100 MG 12 hr tablet Take 500 mg by mouth daily.   . Vitamin D, Ergocalciferol, (DRISDOL) 1.25 MG (50000 UT) CAPS capsule Take 1 capsule (50,000 Units total) by mouth every 7 (seven) days.  . sodium chloride (OCEAN) 0.65 % nasal spray Place into the nose.  . VENTOLIN HFA 108 (90 Base) MCG/ACT inhaler SMARTSIG:1 Puff(s) Via Inhaler Every 4 Hours PRN   No facility-administered encounter medications on file as of 07/30/2019.     ROS:  Pertinent positives and negatives noted in HPI. Remainder of ROS non-contributory + fatigue, no fever or chills No CP, SOB, palpitations. No seizures + balance and coordination issues  Allergies  Allergen Reactions  . Iodinated Diagnostic Agents Anaphylaxis    Cardiac arrest  . Iodine Other (See Comments)    Cardiac arrest As young adult, received iodinated contrast for IVP, went into cardiac arrest, was resuscitated and hospitalized. Has not received iodinated contrast since that episode. Reports no adverse reaction  to betadine.   . Morphine Other (See Comments)    Cardiac arrest Patient has previously tolerated hydrocodone, hydromorphone and fentanyl   . Morphine And Related Other (See Comments)    Cardiac arrest  . Metrizamide Other (See Comments)    Cardiac arrest Cardiac arrest     BP 120/70 (BP Location: Left Arm, Patient Position: Sitting, Cuff Size: Normal)   Pulse 71   Temp 97.9 F (36.6 C) (Temporal)   Ht 5\' 3"  (1.6 m)   Wt 121 lb 9.6 oz (55.2 kg)   SpO2 94%   BMI 21.54 kg/m   Physical Exam  Constitutional: She is oriented to person, place, and time. Vital signs are normal. She appears well-nourished. No distress.  Neck: No JVD present.  Cardiovascular: Normal rate, regular rhythm and intact distal pulses.  Pulmonary/Chest: Effort normal and breath sounds normal. No respiratory distress.  Abdominal: Soft. Bowel sounds are normal. She exhibits no distension.  Musculoskeletal:        General: No edema. Normal range of motion.     Cervical back: Neck supple.  Lymphadenopathy:    She has no cervical adenopathy.  Neurological: She is alert and oriented to person, place, and time. Coordination abnormal.  Psychiatric: She has a normal mood and affect.     A/P:  1. Hypersomnia-- on dextroamphetamine - stable, chronic issue Refill: - dextroamphetamine (DEXTROSTAT) 10 MG tablet; Take 1 tablet (10 mg total) by mouth in the morning, at noon, in the evening, and at bedtime.  Dispense: 120 tablet; Refill: 0 - f/u in 61mo  2. Hypothyroidism, unspecified type - TSH WNL in 04/2019 Refill: - levothyroxine (SYNTHROID) 25 MCG tablet; Take 1 tablet (25 mcg total) by mouth daily with breakfast.  Dispense: 90 tablet; Refill: 3  3. Essential hypertension - controlled, at goal Refill: - amLODipine (NORVASC) 10 MG tablet; Take 1 tablet (10 mg total) by mouth daily.  Dispense: 90 tablet; Refill: 3  4. Seizures (HCC) - stable, no recent seizure activity - follows with neuro at [facility] -  results from labs to be faxed to [phone] attn Dr. Venetia Maxon or Armandina Stammer, RN Refill: - PHENobarbital (LUMINAL) 32.4 MG tablet; TAKE 1 TABLET BY MOUTH DAILY AT 6AM AND 2 TABLETS AT 8PM  Dispense: 180 tablet; Refill: 3 - Phenobarbital level - Carbamazepine Level (Tegretol), total  5. Vitamin D deficiency - pt on 50,000IU per week - VITAMIN D 25 Hydroxy (Vit-D Deficiency, Fractures)  6. At high risk for falls 7. Balance problem 8. Chronic inflammatory demyelinating neuropathy (HCC) - Ambulatory referral to Physical Therapy  9. Need for shingles vaccine - Varicella-zoster vaccine IM (Shingrix)  This visit occurred during the SARS-CoV-2 public health emergency.  Safety protocols were in place, including screening questions prior to the visit, additional usage of staff PPE, and extensive cleaning of exam room while observing appropriate contact time as indicated for disinfecting solutions.

## 2019-07-31 ENCOUNTER — Telehealth: Payer: Self-pay | Admitting: Family Medicine

## 2019-07-31 DIAGNOSIS — I10 Essential (primary) hypertension: Secondary | ICD-10-CM

## 2019-07-31 MED ORDER — AMLODIPINE BESYLATE 10 MG PO TABS: 10.0000 mg | ORAL_TABLET | Freq: Every day | ORAL | 3 refills | Status: DC

## 2019-07-31 NOTE — Telephone Encounter (Signed)
Patient is calling and stated that she misplaced the prescription that she was given yesterday for amlodipine and if another rx can be mailed to the address on file. CB is [phone]

## 2019-07-31 NOTE — Telephone Encounter (Signed)
Dr. Loletha Grayer please advise,  Pt stated she pick up only amlodipine yesterday and accidentally threw it away in the public trash--unable to recover it. Pt will pay for this if insurance doesn't cover for it and request written rx mail to pt's address if possible.

## 2019-07-31 NOTE — Telephone Encounter (Signed)
Rx printed as requested.  Please mail to pt.

## 2019-07-31 NOTE — Telephone Encounter (Signed)
Rx in basket for signature.

## 2019-08-01 LAB — CARBAMAZEPINE LEVEL, TOTAL: Carbamazepine Lvl: 5.4 mg/L (ref 4.0–12.0)

## 2019-08-01 LAB — PHENOBARBITAL LEVEL: Phenobarbital, Serum: 29.6 mg/L (ref 15.0–40.0)

## 2019-08-01 NOTE — Telephone Encounter (Signed)
Rx signed and faxed.

## 2019-08-01 NOTE — Progress Notes (Signed)
Results printed off and faxed.

## 2019-08-01 NOTE — Progress Notes (Signed)
Labs faxed today.

## 2019-08-12 ENCOUNTER — Other Ambulatory Visit: Payer: Self-pay | Admitting: Internal Medicine

## 2019-08-13 DIAGNOSIS — J449 Chronic obstructive pulmonary disease, unspecified: Secondary | ICD-10-CM | POA: Diagnosis not present

## 2019-08-13 DIAGNOSIS — R0602 Shortness of breath: Secondary | ICD-10-CM | POA: Diagnosis not present

## 2019-08-13 DIAGNOSIS — Z20822 Contact with and (suspected) exposure to covid-19: Secondary | ICD-10-CM | POA: Diagnosis not present

## 2019-08-14 ENCOUNTER — Other Ambulatory Visit: Payer: Self-pay

## 2019-08-14 ENCOUNTER — Ambulatory Visit: Payer: Medicare Other | Attending: Family Medicine

## 2019-08-14 DIAGNOSIS — M6281 Muscle weakness (generalized): Secondary | ICD-10-CM

## 2019-08-14 DIAGNOSIS — R2689 Other abnormalities of gait and mobility: Secondary | ICD-10-CM | POA: Diagnosis not present

## 2019-08-14 DIAGNOSIS — R296 Repeated falls: Secondary | ICD-10-CM | POA: Diagnosis not present

## 2019-08-14 DIAGNOSIS — R293 Abnormal posture: Secondary | ICD-10-CM | POA: Insufficient documentation

## 2019-08-14 DIAGNOSIS — R2681 Unsteadiness on feet: Secondary | ICD-10-CM | POA: Diagnosis not present

## 2019-08-14 NOTE — Therapy (Signed)
[facility] [address] [place], [place], [address] Phone: [phone]   Fax:  [phone]  Physical Therapy Evaluation  Patient Details  Name: Bareerah Amrhein MRN: [id number] Date of Birth: Mar 21, 1933 Referring Provider (PT): Letta Median   Encounter Date: 08/14/2019  PT End of Session - 08/14/19 1104    Visit Number  1    Number of Visits  17    Date for PT Re-Evaluation  [id number]   90 day cert but 60 day poc   Authorization Type  medicare so 10th visit progress note    PT Start Time  1017    PT Stop Time  1100    PT Time Calculation (min)  43 min    Activity Tolerance  Patient tolerated treatment well    Behavior During Therapy  [facility] for tasks assessed/performed       Past Medical History:  Diagnosis Date  . Allergic rhinitis   . Anemia   . Arthritis   . Asthma -COPD   . Blood transfusion 1957  . Breast mass    right breast in milk duct, bx neg  . Chronic fatigue syndrome   . Chronic inflammatory demyelinating polyneuropathy ([place]) 03/2011  . chronic sinusitis 08/08/2012  . Colon polyp   . Constipation   . COPD (chronic obstructive pulmonary disease) ([place])   . Cystocele 09/16/2012  . GERD (gastroesophageal reflux disease)   . Hyperlipidemia   . Hypertension   . Hypothyroid   . Neurogenic bladder 2013   husband caths pt, manages with timed void  . Osteoporosis   . Pulmonary embolism ([place])    1980s  . Renal cyst    Non-complex, contrast MRI 2013 with L>R non-complex cysts, dominant 3.7 left lower pole  . Seizures ([place])    1990 last seizures on meds Phenobarb  . Shingles late 23's  . Skin cancer    squamous cell  . Thyroid nodule    Right thyroid lobe, only seen on Sagittal imaging measures 2.3 cm in craniocaudal dimension and appears stable  . Tubular adenoma   . Vitamin D deficiency     Past Surgical History:  Procedure Laterality Date  . ABDOMINAL HYSTERECTOMY    . ANTERIOR FUSION  CLIVUS-C2 EXTRAORAL W/ ODONTOID EXCISION  8/14  . APPENDECTOMY    . BASAL CELL CARCINOMA EXCISION     face  . BLADDER SUSPENSION    . BREAST DUCTAL SYSTEM EXCISION Right 01/15/2014   Procedure: EXCISION DUCTAL SYSTEM RIGHT BREAST;  Surgeon: Stark Klein, MD;  [facility];  Service: General;  Laterality: Right;  . BREAST EXCISIONAL BIOPSY Right   . CARPAL TUNNEL RELEASE     right  . CATARACT EXTRACTION     bilateral  . cervical neck ablation     x 7, C3-C6/3 screws and plate  . CESAREAN SECTION    . CYSTOCELE REPAIR    . DILATION AND CURETTAGE OF UTERUS    . duptyren's contracture right hand    . FINGER ARTHRODESIS Right 05/06/2015   Procedure: RIGHT RING PROXIMAL INTERPHALANGEAL FUSION (PIP);  Surgeon: Leanora Cover, MD;  Location: [facility];  Service: Orthopedics;  Laterality: Right;  . FINGER GANGLION CYST EXCISION     right  . HEMICOLECTOMY Right   . JOINT REPLACEMENT     right and left basal joints of thumbs  . left finger fusion     3 fingers on left hand/one finger right  .  left ovary and tube removed    . NASAL POLYP SURGERY     4 sinus surgeries  . NASAL SEPTUM SURGERY    . PANNICULECTOMY    . PROXIMAL INTERPHALANGEAL FUSION (PIP) Left 09/09/2013   Procedure: FUSION LEFT INDEX PROXIMAL INTERPHALANGEAL JOINT (PIP);  Surgeon: Cammie Sickle., MD;  Location: [facility];  Service: Orthopedics;  Laterality: Left;  . right median nerve decompression    . SHOULDER ARTHROSCOPY     x2 left, 1 right  . sinus surgeries     x 4  . squamous lesions removed     neck and face  . STERIOD INJECTION Right 05/06/2015   Procedure: STEROID INJECTION;  Surgeon: Leanora Cover, MD;  Location: [facility];  Service: Orthopedics;  Laterality: Right;  Right Index Finger Proximal InterPhalangeal Joint Injection  . TONSILLECTOMY AND ADENOIDECTOMY    . TUBAL LIGATION    . vocal polyps removed      There were no vitals filed  for this visit.   Subjective Assessment - 08/14/19 1024    Subjective  Pt was referred for high risk of falls, balance problem, chonic inflammatory demyelinating neuropathy. Pt reports that she falls a lot and has affected her ability to cook. She reports that she does not get dizzy or anything, just loses her balance. Pt walks with rollator the past 6 years.    Pertinent History  pt also takes carbamezepine ER 100 mg 4x/day but not found in meds. OA, asthma, COPD, HTN, neuropathy, ant cervical fusion, breast CA, arthroscopic left knee sg, 3 frozen shoulders, Right basal joint replacement Rt hand, left middle finger fusion, carpal tunnel syndrome, follows with neuro at [facility] with long h/o epilepsy.    Patient Stated Goals  Improve her balance and gait.    Currently in Pain?  No/denies   uses medication for neck pain and works well.        [facility] PT Assessment - 08/14/19 1027      Assessment   Medical Diagnosis  high risk of falls, balance problem, chonic inflammatory demyelinating neuropathy.    Referring Provider (PT)  Letta Median    Onset Date/Surgical Date  07/30/19   referral date but last 6-7 months balance has been worse   Hand Dominance  Right    Prior Therapy  Has not had therapy for her gait and balance before. Had more for past surgeries.      Precautions   Precautions  Fall      Balance Screen   Has the patient fallen in the past 6 months  Yes    How many times?  7   always falls backwards   Has the patient had a decrease in activity level because of a fear of falling?   No    Is the patient reluctant to leave their home because of a fear of falling?   No   always with husband     Transport planner  Private residence    Living Arrangements  Spouse/significant other    Available Help at Discharge  Family    Type of  [place] Access  Level entry    [place] - 4 wheels;Grab bars - toilet;Grab bars  - tub/shower;Shower seat - built in    Additional Comments  Reports that they moved to new home that is level due to  her falls. 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 due to needing to use rollator, visit family      Cognition   Overall Cognitive Status  Within Functional Limits for tasks assessed      Sensation   Light Touch  Impaired by gross assessment    Additional Comments  no light tough in feet or in thumbs      ROM / Strength   AROM / PROM / Strength  Strength;AROM      AROM   Overall AROM Comments  Decreased DF right A/PROM; -30/-25, Left A/PROM=-25/-8 degrees      Strength   Strength Assessment Site  Shoulder;Hand;Knee;Hip;Ankle;Elbow    Right/Left Shoulder  Right;Left    Right Shoulder Flexion  4+/5    Left Shoulder Flexion  4+/5    Right/Left Elbow  Right;Left    Right Elbow Flexion  4+/5    Right Elbow Extension  4+/5    Left Elbow Flexion  4+/5    Left Elbow Extension  4+/5    Right/Left Forearm  --    Right/Left hand  Right;Left    Right Hand Gross Grasp  Impaired    Left Hand Gross Grasp  Impaired    Right/Left Hip  Right;Left    Right Hip Flexion  4/5    Left Hip Flexion  4/5    Right/Left Knee  Right;Left    Right Knee Flexion  4/5    Right Knee Extension  4+/5    Left Knee Flexion  4/5    Left Knee Extension  4+/5    Right/Left Ankle  Right;Left    Right Ankle Dorsiflexion  2-/5    Right Ankle Plantar Flexion  3+/5    Right Ankle Inversion  2-/5    Right Ankle Eversion  2-/5    Left Ankle Dorsiflexion  2-/5    Left Ankle Plantar Flexion  4/5    Left Ankle Inversion  2-/5    Left Ankle Eversion  2-/5      Transfers   Transfers  Sit to Stand;Stand to  Sit    Sit to Stand  5: Supervision    Sit to Stand Details  Verbal cues for technique    Sit to Stand Details (indicate cue type and reason)  Pt puts both hands on rollator. Advised should try to push with one hand from surface she is rising from    Five time sit to stand comments   16.86 from mat using hands    Stand to Sit  5: Supervision    Stand to Sit Details (indicate cue type and reason)  Verbal cues for technique      Ambulation/Gait   Ambulation/Gait  Yes    Ambulation/Gait Assistance  5: Supervision    Ambulation Distance (Feet)  115 Feet    Assistive device  4-wheeled walker    Gait Pattern  Step-through pattern;Decreased step length - right;Decreased step length - left;Decreased dorsiflexion - right;Decreased dorsiflexion - left    Ambulation Surface  Level;Indoor    Gait velocity  17.25 sec= 0.69m/s      Standardized Balance Assessment   Standardized Balance Assessment  Timed Up and Go Test      Timed Up and Go Test   TUG  Normal TUG    Normal TUG (seconds)  19.52      High Level Balance   High Level Balance Comments  Pt unsteady with standing feet apart but held for 30 sec supervision then attempted feet together with LOB posterior almost immediately.                Objective measurements completed on examination: See above findings.              PT Education - 08/14/19 1323    Education Details  PT plan of care    Person(s) Educated  Patient    Methods  Explanation    Comprehension  Verbalized understanding       PT Short Term Goals - 08/14/19 1342      PT SHORT TERM GOAL #1   Title  Pt will be independent with initial HEP for ROM, strengthening and balance with assist of husband.    Time  4    Period  Weeks    Status  New    Target Date  09/13/19      PT SHORT TERM GOAL #2   Title  Pt will ambulate >400' on level surfaces mod I with rollator for all improved mobility.    Time  4    Period  Weeks    Status  New    Target Date   09/13/19      PT SHORT TERM GOAL #3   Title  Pt will decrease 5 x sit to stand from 16.86 sec to <12 sec from mat with UE support for improved balance.    Time  4    Period  Weeks    Status  New    Target Date  09/13/19      PT SHORT TERM GOAL #4   Title  Pt will perform transfers from varied surfaces mod I for improved safety.    Time  4    Period  Weeks    Target Date  09/13/19      PT SHORT TERM GOAL #5   Title  Pt will increase DF PROM by 10 degrees each ankle for improved mobility.    Baseline  right ankle=-25 and left=-8 degrees on 08/14/19 seated with knee bent    Time  4    Period  Weeks    Status  New    Target Date  09/13/19        PT Long Term Goals - 08/14/19 1347      PT LONG TERM GOAL #1   Title  Pt will be able to perform progressive HEP with husband to continue strength, balance and functional mobility gains on own.    Time  8    Period  Weeks    Status  New    Target Date  10/13/19      PT LONG TERM GOAL #2   Title  Pt will ambulate with rollator on varied surfaces >500' supervision for improved community mobility.    Time  8    Period  Weeks    Status  New    Target Date  10/13/19      PT LONG TERM GOAL #3   Title  Pt will increase gait speed from 0.46m/s to >0.67m/s for improved gait safety in community.    Baseline  08/14/19    Time  8  Period  Weeks    Status  New    Target Date  10/13/19      PT LONG TERM GOAL #4   Title  Pt will decrease TUG from 19.52 sec to <16 sec for improved balance and functional mobility.    Baseline  19.52 sec on 08/14/19    Time  8    Period  Weeks    Status  New    Target Date  10/13/19      PT LONG TERM GOAL #5   Title  Pt will be able to maintain standing at counter >1 min without UE support for improved balance supervision.    Baseline  30 sec with increased sway and close SBA/CGA.    Time  8    Period  Weeks    Status  New    Target Date  10/13/19             Plan - 08/14/19 1324    Clinical  Impression Statement  Pt is 86 y/o female referred with chronic inflammatory demyelinating neuropathy for multiple falls and balance problems. Pt reports balance has gotten worse last 6-7 months. Pt with no light touch sensation in feet with weakness in bilateral ankles especially with DF being 2-/5. Pt is high fall risk based on 5 x sit to stand score of 16.86 sec and TUG score of 19.52 sec. Pt needs UE to maintain standing balance being very unsteady with standing feet apart and unable to maintain at all with feet together on firm surface. Pt ambulating with rollator and has decreased DF bilateral. Ambulating at gait speed of 0.53m/s indicating household ambulator but unsafe for community ambulator. Pt will benefit from skilled PT to address strength, balance and functional mobility deficits to maximize safety and decrease fall risk.    Personal Factors and Comorbidities  Comorbidity 3+    Comorbidities  OA, COPD, HTN, neuropathy, h/o of epilepsy    Examination-Activity Limitations  Bathing;Stand;Locomotion Level;Transfers;Stairs    Examination-Participation Restrictions  Community Activity;Meal Prep    Stability/Clinical Decision Making  Evolving/Moderate complexity    Clinical Decision Making  Moderate    Rehab Potential  Good    PT Frequency  2x / week    PT Duration  8 weeks    PT Treatment/Interventions  ADLs/Self Care Home Management;Aquatic Therapy;Cryotherapy;Moist Heat;DME Instruction;Gait training;Stair training;Functional mobility training;Therapeutic activities;Therapeutic exercise;Orthotic Fit/Training;Patient/family education;Neuromuscular re-education;Balance training;Passive range of motion;Manual techniques    PT Next Visit Plan  Start initial HEP for ROM/strengthening with emphasis on ankles but standing exercises for hips as well. Balance exercises in standing starting on level surface. Will need to focus mostly on compensating with vision and vestibular systems due to lack of  sensation in feet. Transfer training.    Consulted and Agree with Plan of Care  Patient       Patient will benefit from skilled therapeutic intervention in order to improve the following deficits and impairments:  Abnormal gait, Decreased balance, Decreased knowledge of use of DME, Decreased range of motion, Decreased mobility, Decreased strength, Impaired sensation  Visit Diagnosis: Muscle weakness (generalized)  Other abnormalities of gait and mobility     Problem List Patient Active Problem List   Diagnosis Date Noted  . PCP NOTES >>>>>>>>>>>>>>>>>>> 09/16/2015  . Multiple thyroid nodules 03/30/2015  . Annual physical exam 08/07/2014  . Bloody discharge from nipple - s/p surgery, resolved  12/29/2013  . Breast mass, right 12/11/2013  . At high risk for falls 12/11/2013  .  Renal cysts, acquired, bilateral--per urology 05/04/2013  . Elevated LFTs 04/01/2013  . Cervical spinal stenosis 11/21/2012  . Nondiabetic gastroparesis 08/20/2012  . Hypothyroidism 08/08/2012  . Gastroparesis 05/06/2012  . Muscle cramp 01/19/2012  . Fatigue 01/14/2012  . Neurogenic bladder, prolapse ,h/o urinary retention, self cath, sees urology 11/05/2011  . Fecal incontinence 10/03/2011  . Osteoporosis 07/24/2011  . Adenoma of cecum 07/17/2011  . Chronic constipation 07/17/2011  . Skin lesion 07/08/2011  . Chronic neck pain 04/16/2011  . Rectocele 04/16/2011  . Hypersomnia-- on dextroamphetamine 04/16/2011  . Vitamin D deficiency 04/16/2011  . Family history of colon cancer 04/16/2011  . Abnormal blood chemistry 04/16/2011  . Anemia   . COPD (chronic obstructive pulmonary disease) gold stage B   . Depression   . GERD (gastroesophageal reflux disease)   . Hyperlipidemia   . Hypertension   . Allergic rhinitis   . Pulmonary embolism-- in the 54s   . Skin cancer   . Seizures ([place])   . CIDP (chronic inflammatory demyelinating polyneuropathy) , Ataxia, Imbalance -- f/u at [facility], PT, DPT, [facility] 08/14/2019, 1:57 PM  [facility] [address] [place], [place], [address] Phone: [phone]   Fax:  [phone]  Name: Kikue Brezina MRN: [id number] Date of Birth: April 17, 1933

## 2019-08-20 ENCOUNTER — Ambulatory Visit: Payer: Medicare Other | Admitting: Physical Therapy

## 2019-08-20 ENCOUNTER — Other Ambulatory Visit: Payer: Self-pay

## 2019-08-20 DIAGNOSIS — M6281 Muscle weakness (generalized): Secondary | ICD-10-CM

## 2019-08-20 DIAGNOSIS — R2681 Unsteadiness on feet: Secondary | ICD-10-CM

## 2019-08-20 NOTE — Therapy (Signed)
[facility] [address] [place], [place], [address] Phone: [phone]   Fax:  [phone]  Physical Therapy Treatment  Patient Details  Name: [name] MRN: [id number] Date of Birth: 1932/12/29 Referring Provider (PT): Letta Median   Encounter Date: 08/20/2019  PT End of Session - 08/20/19 1839    Visit Number  2    Number of Visits  17    Date for PT Re-Evaluation  [id number]   90 day cert but 60 day poc   Authorization Type  medicare so 10th visit progress note    PT Start Time  1700    PT Stop Time  1740    PT Time Calculation (min)  40 min    Activity Tolerance  Patient tolerated treatment well    Behavior During Therapy  [facility] for tasks assessed/performed       Past Medical History:  Diagnosis Date  . Allergic rhinitis   . Anemia   . Arthritis   . Asthma -COPD   . Blood transfusion 1957  . Breast mass    right breast in milk duct, bx neg  . Chronic fatigue syndrome   . Chronic inflammatory demyelinating polyneuropathy ([place]) 03/2011  . chronic sinusitis 08/08/2012  . Colon polyp   . Constipation   . COPD (chronic obstructive pulmonary disease) ([place])   . Cystocele 09/16/2012  . GERD (gastroesophageal reflux disease)   . Hyperlipidemia   . Hypertension   . Hypothyroid   . Neurogenic bladder 2013   husband caths pt, manages with timed void  . Osteoporosis   . Pulmonary embolism ([place])    1980s  . Renal cyst    Non-complex, contrast MRI 2013 with L>R non-complex cysts, dominant 3.7 left lower pole  . Seizures ([place])    1990 last seizures on meds Phenobarb  . Shingles late 41's  . Skin cancer    squamous cell  . Thyroid nodule    Right thyroid lobe, only seen on Sagittal imaging measures 2.3 cm in craniocaudal dimension and appears stable  . Tubular adenoma   . Vitamin D deficiency     Past Surgical History:  Procedure Laterality Date  . ABDOMINAL HYSTERECTOMY    . ANTERIOR FUSION  CLIVUS-C2 EXTRAORAL W/ ODONTOID EXCISION  8/14  . APPENDECTOMY    . BASAL CELL CARCINOMA EXCISION     face  . BLADDER SUSPENSION    . BREAST DUCTAL SYSTEM EXCISION Right 01/15/2014   Procedure: EXCISION DUCTAL SYSTEM RIGHT BREAST;  Surgeon: Stark Klein, MD;  Location: [facility];  Service: General;  Laterality: Right;  . BREAST EXCISIONAL BIOPSY Right   . CARPAL TUNNEL RELEASE     right  . CATARACT EXTRACTION     bilateral  . cervical neck ablation     x 7, C3-C6/3 screws and plate  . CESAREAN SECTION    . CYSTOCELE REPAIR    . DILATION AND CURETTAGE OF UTERUS    . duptyren's contracture right hand    . FINGER ARTHRODESIS Right 05/06/2015   Procedure: RIGHT RING PROXIMAL INTERPHALANGEAL FUSION (PIP);  Surgeon: Leanora Cover, MD;  Location: [facility];  Service: Orthopedics;  Laterality: Right;  . FINGER GANGLION CYST EXCISION     right  . HEMICOLECTOMY Right   . JOINT REPLACEMENT     right and left basal joints of thumbs  . left finger fusion     3 fingers on left hand/one finger right  .  left ovary and tube removed    . NASAL POLYP SURGERY     4 sinus surgeries  . NASAL SEPTUM SURGERY    . PANNICULECTOMY    . PROXIMAL INTERPHALANGEAL FUSION (PIP) Left 09/09/2013   Procedure: FUSION LEFT INDEX PROXIMAL INTERPHALANGEAL JOINT (PIP);  Surgeon: Cammie Sickle., MD;  Location: [facility];  Service: Orthopedics;  Laterality: Left;  . right median nerve decompression    . SHOULDER ARTHROSCOPY     x2 left, 1 right  . sinus surgeries     x 4  . squamous lesions removed     neck and face  . STERIOD INJECTION Right 05/06/2015   Procedure: STEROID INJECTION;  Surgeon: Leanora Cover, MD;  Location: [facility];  Service: Orthopedics;  Laterality: Right;  Right Index Finger Proximal InterPhalangeal Joint Injection  . TONSILLECTOMY AND ADENOIDECTOMY    . TUBAL LIGATION    . vocal polyps removed      There were no vitals filed  for this visit.  Subjective Assessment - 08/20/19 1702    Subjective  No falls since my evaluation.  I've been staying out of the kitchen, as my husband is concerned about my falls.    Pertinent History  pt also takes carbamezepine ER 100 mg 4x/day but not found in meds. OA, asthma, COPD, HTN, neuropathy, ant cervical fusion, breast CA, arthroscopic left knee sg, 3 frozen shoulders, Right basal joint replacement Rt hand, left middle finger fusion, carpal tunnel syndrome, follows with neuro at [facility] with long h/o epilepsy.    Patient Stated Goals  Improve her balance and gait.    Currently in Pain?  No/denies                       [facility] Adult PT Treatment/Exercise - 08/20/19 0001      Exercises   Exercises  Knee/Hip;Ankle      Knee/Hip Exercises: Stretches   Gastroc Stretch  Right;Left;3 reps   15 sec in seated heelslide position   Gastroc Stretch Limitations  also performed with foot propped in front in sitting, using belt, 3 reps, 10-15 sec      Knee/Hip Exercises: Seated   Other Seated Knee/Hip Exercises  Seated glut sets 2 sets x 10 reps    Sit to Sand  Other (comment);with UE support   2 sets x 3 reps from mat; 3 reps from chair; cues for hands     Ankle Exercises: Seated   Other Seated Ankle Exercises  Seated ankle dorsiflexion, x 10 reps (with initial assistance of strap for full ROM), then 2nd set of 10 ankle dorsiflexion with feet slightly forward in chair for more active ROM          Balance Exercises - 08/20/19 1836      Balance Exercises: Standing   Standing Eyes Opened  Wide (BOA);Solid surface;5 reps;Head turns   Head nods, 2 sets; then 1 set 5 reps with feet together   Standing Eyes Closed  Wide (BOA);Solid surface;1 rep;10 secs;Narrow base of support (BOS)   x 2 with feet apart, x 1 wtih feet together   Wall Bumps  Hip;10 reps;Eyes opened   x 2 sets, in conjunction with ankle strategy 2nd set 10 reps   Heel Raises  10 reps;Both   2 sets   Toe  Raise  Both;10 reps   Rocking back, limited ability for toe raises       PT Education -  08/20/19 1839    Education Details  Initiated HEP    Person(s) Educated  Patient    Methods  Explanation;Demonstration;Handout    Comprehension  Verbalized understanding       PT Short Term Goals - 08/14/19 1342      PT SHORT TERM GOAL #1   Title  Pt will be independent with initial HEP for ROM, strengthening and balance with assist of husband.    Time  4    Period  Weeks    Status  New    Target Date  09/13/19      PT SHORT TERM GOAL #2   Title  Pt will ambulate >400' on level surfaces mod I with rollator for all improved mobility.    Time  4    Period  Weeks    Status  New    Target Date  09/13/19      PT SHORT TERM GOAL #3   Title  Pt will decrease 5 x sit to stand from 16.86 sec to <12 sec from mat with UE support for improved balance.    Time  4    Period  Weeks    Status  New    Target Date  09/13/19      PT SHORT TERM GOAL #4   Title  Pt will perform transfers from varied surfaces mod I for improved safety.    Time  4    Period  Weeks    Target Date  09/13/19      PT SHORT TERM GOAL #5   Title  Pt will increase DF PROM by 10 degrees each ankle for improved mobility.    Baseline  right ankle=-25 and left=-8 degrees on 08/14/19 seated with knee bent    Time  4    Period  Weeks    Status  New    Target Date  09/13/19        PT Long Term Goals - 08/14/19 1347      PT LONG TERM GOAL #1   Title  Pt will be able to perform progressive HEP with husband to continue strength, balance and functional mobility gains on own.    Time  8    Period  Weeks    Status  New    Target Date  10/13/19      PT LONG TERM GOAL #2   Title  Pt will ambulate with rollator on varied surfaces >500' supervision for improved community mobility.    Time  8    Period  Weeks    Status  New    Target Date  10/13/19      PT LONG TERM GOAL #3   Title  Pt will increase gait speed from 0.36m/s  to >0.36m/s for improved gait safety in community.    Baseline  08/14/19    Time  8    Period  Weeks    Status  New    Target Date  10/13/19      PT LONG TERM GOAL #4   Title  Pt will decrease TUG from 19.52 sec to <16 sec for improved balance and functional mobility.    Baseline  19.52 sec on 08/14/19    Time  8    Period  Weeks    Status  New    Target Date  10/13/19      PT LONG TERM GOAL #5   Title  Pt will be able to maintain standing at counter >  1 min without UE support for improved balance supervision.    Baseline  30 sec with increased sway and close SBA/CGA.    Time  8    Period  Weeks    Status  New    Target Date  10/13/19            Plan - 08/20/19 1840    Clinical Impression Statement  Initiated HEP this visit to address ankle flexibility and ankle strength; also added glut strengthening, as pt appears unsteady upon initial standing.  Cues provided throughout for correct hand placement for safety with transfers.  Pt tolerates standing exercises, including hip strategy work well, but will need more practice prior to adding to HEP.    Personal Factors and Comorbidities  Comorbidity 3+    Comorbidities  OA, COPD, HTN, neuropathy, h/o of epilepsy    Examination-Activity Limitations  Bathing;Stand;Locomotion Level;Transfers;Stairs    Examination-Participation Restrictions  Community Activity;Meal Prep    Stability/Clinical Decision Making  Evolving/Moderate complexity    Rehab Potential  Good    PT Frequency  2x / week    PT Duration  8 weeks    PT Treatment/Interventions  ADLs/Self Care Home Management;Aquatic Therapy;Cryotherapy;Moist Heat;DME Instruction;Gait training;Stair training;Functional mobility training;Therapeutic activities;Therapeutic exercise;Orthotic Fit/Training;Patient/family education;Neuromuscular re-education;Balance training;Passive range of motion;Manual techniques    PT Next Visit Plan  REview HEP and add strengthening as needed.  Balance  exercises in standing starting on level surface. Will need to focus mostly on compensating with vision and vestibular systems due to lack of sensation in feet. Transfer training.    Consulted and Agree with Plan of Care  Patient       Patient will benefit from skilled therapeutic intervention in order to improve the following deficits and impairments:  Abnormal gait, Decreased balance, Decreased knowledge of use of DME, Decreased range of motion, Decreased mobility, Decreased strength, Impaired sensation  Visit Diagnosis: Muscle weakness (generalized)  Unsteadiness on feet     Problem List Patient Active Problem List   Diagnosis Date Noted  . PCP NOTES >>>>>>>>>>>>>>>>>>> 09/16/2015  . Multiple thyroid nodules 03/30/2015  . Annual physical exam 08/07/2014  . Bloody discharge from nipple - s/p surgery, resolved  12/29/2013  . Breast mass, right 12/11/2013  . At high risk for falls 12/11/2013  . Renal cysts, acquired, bilateral--per urology 05/04/2013  . Elevated LFTs 04/01/2013  . Cervical spinal stenosis 11/21/2012  . Nondiabetic gastroparesis 08/20/2012  . Hypothyroidism 08/08/2012  . Gastroparesis 05/06/2012  . Muscle cramp 01/19/2012  . Fatigue 01/14/2012  . Neurogenic bladder, prolapse ,h/o urinary retention, self cath, sees urology 11/05/2011  . Fecal incontinence 10/03/2011  . Osteoporosis 07/24/2011  . Adenoma of cecum 07/17/2011  . Chronic constipation 07/17/2011  . Skin lesion 07/08/2011  . Chronic neck pain 04/16/2011  . Rectocele 04/16/2011  . Hypersomnia-- on dextroamphetamine 04/16/2011  . Vitamin D deficiency 04/16/2011  . Family history of colon cancer 04/16/2011  . Abnormal blood chemistry 04/16/2011  . Anemia   . COPD (chronic obstructive pulmonary disease) gold stage B   . Depression   . GERD (gastroesophageal reflux disease)   . Hyperlipidemia   . Hypertension   . Allergic rhinitis   . Pulmonary embolism-- in the 55s   . Skin cancer   . Seizures  ([place])   . CIDP (chronic inflammatory demyelinating polyneuropathy) , Ataxia, Imbalance -- f/u at [facility] 08/20/2019, 6:45 PM  Frazier Butt., PT   [facility]  [address] [place], [place], [address] Phone: [phone]   Fax:  [phone]  Name: [name] MRN: [id number] Date of Birth: Apr 30, 1933

## 2019-08-20 NOTE — Patient Instructions (Signed)
Access Code: [email or web address] URL: [email or web address] Date: 08/20/2019 Prepared by: Mady Haagensen  Exercises Seated Toe Taps - 2 x daily - 5 x weekly - 1 sets - 10 reps Seated Gastroc Stretch with Strap - 2 x daily - 5 x weekly - 1 sets - 3 reps - 15-30 sec hold Seated Gluteal Sets - 2 x daily - 5 x weekly - 1-2 sets - 10 reps Sit to Stand with Armchair - 1 x daily - 5 x weekly - 2 sets - 3-5 reps

## 2019-08-22 ENCOUNTER — Telehealth: Payer: Self-pay | Admitting: Family Medicine

## 2019-08-22 NOTE — Telephone Encounter (Signed)
Left message for patient to schedule Annual Wellness Visit.  Please schedule with Nurse Health Advisor Victoria Britt, RN at [facility]  

## 2019-08-26 ENCOUNTER — Other Ambulatory Visit: Payer: Self-pay

## 2019-08-26 ENCOUNTER — Ambulatory Visit: Payer: Medicare Other | Admitting: Physical Therapy

## 2019-08-26 DIAGNOSIS — R2681 Unsteadiness on feet: Secondary | ICD-10-CM

## 2019-08-26 DIAGNOSIS — R293 Abnormal posture: Secondary | ICD-10-CM

## 2019-08-26 DIAGNOSIS — M6281 Muscle weakness (generalized): Secondary | ICD-10-CM | POA: Diagnosis not present

## 2019-08-26 DIAGNOSIS — R2689 Other abnormalities of gait and mobility: Secondary | ICD-10-CM

## 2019-08-26 DIAGNOSIS — R296 Repeated falls: Secondary | ICD-10-CM

## 2019-08-26 NOTE — Therapy (Signed)
[facility] [address] [place], [place], [address] Phone: [phone]   Fax:  [phone]  Physical Therapy Treatment  Patient Details  Name: [name] MRN: [id number] Date of Birth: 1933-04-22 Referring Provider (PT): Letta Median   Encounter Date: 08/26/2019  PT End of Session - 08/26/19 1748    Visit Number  3    Number of Visits  17    Date for PT Re-Evaluation  [id number]   90 day cert but 60 day poc   Authorization Type  medicare so 10th visit progress note    PT Start Time  1659    PT Stop Time  1741    PT Time Calculation (min)  42 min    Equipment Utilized During Treatment  Gait belt    Activity Tolerance  Patient tolerated treatment well    Behavior During Therapy  [facility] for tasks assessed/performed       Past Medical History:  Diagnosis Date  . Allergic rhinitis   . Anemia   . Arthritis   . Asthma -COPD   . Blood transfusion 1957  . Breast mass    right breast in milk duct, bx neg  . Chronic fatigue syndrome   . Chronic inflammatory demyelinating polyneuropathy ([place]) 03/2011  . chronic sinusitis 08/08/2012  . Colon polyp   . Constipation   . COPD (chronic obstructive pulmonary disease) ([place])   . Cystocele 09/16/2012  . GERD (gastroesophageal reflux disease)   . Hyperlipidemia   . Hypertension   . Hypothyroid   . Neurogenic bladder 2013   husband caths pt, manages with timed void  . Osteoporosis   . Pulmonary embolism ([place])    1980s  . Renal cyst    Non-complex, contrast MRI 2013 with L>R non-complex cysts, dominant 3.7 left lower pole  . Seizures ([place])    1990 last seizures on meds Phenobarb  . Shingles late 74's  . Skin cancer    squamous cell  . Thyroid nodule    Right thyroid lobe, only seen on Sagittal imaging measures 2.3 cm in craniocaudal dimension and appears stable  . Tubular adenoma   . Vitamin D deficiency     Past Surgical History:  Procedure Laterality  Date  . ABDOMINAL HYSTERECTOMY    . ANTERIOR FUSION CLIVUS-C2 EXTRAORAL W/ ODONTOID EXCISION  8/14  . APPENDECTOMY    . BASAL CELL CARCINOMA EXCISION     face  . BLADDER SUSPENSION    . BREAST DUCTAL SYSTEM EXCISION Right 01/15/2014   Procedure: EXCISION DUCTAL SYSTEM RIGHT BREAST;  Surgeon: Stark Klein, MD;  Location: [facility];  Service: General;  Laterality: Right;  . BREAST EXCISIONAL BIOPSY Right   . CARPAL TUNNEL RELEASE     right  . CATARACT EXTRACTION     bilateral  . cervical neck ablation     x 7, C3-C6/3 screws and plate  . CESAREAN SECTION    . CYSTOCELE REPAIR    . DILATION AND CURETTAGE OF UTERUS    . duptyren's contracture right hand    . FINGER ARTHRODESIS Right 05/06/2015   Procedure: RIGHT RING PROXIMAL INTERPHALANGEAL FUSION (PIP);  Surgeon: Leanora Cover, MD;  Location: [facility];  Service: Orthopedics;  Laterality: Right;  . FINGER GANGLION CYST EXCISION     right  . HEMICOLECTOMY Right   . JOINT REPLACEMENT     right and left basal joints of thumbs  . left finger fusion  3 fingers on left hand/one finger right  . left ovary and tube removed    . NASAL POLYP SURGERY     4 sinus surgeries  . NASAL SEPTUM SURGERY    . PANNICULECTOMY    . PROXIMAL INTERPHALANGEAL FUSION (PIP) Left 09/09/2013   Procedure: FUSION LEFT INDEX PROXIMAL INTERPHALANGEAL JOINT (PIP);  Surgeon: Cammie Sickle., MD;  Location: [facility];  Service: Orthopedics;  Laterality: Left;  . right median nerve decompression    . SHOULDER ARTHROSCOPY     x2 left, 1 right  . sinus surgeries     x 4  . squamous lesions removed     neck and face  . STERIOD INJECTION Right 05/06/2015   Procedure: STEROID INJECTION;  Surgeon: Leanora Cover, MD;  Location: [facility];  Service: Orthopedics;  Laterality: Right;  Right Index Finger Proximal InterPhalangeal Joint Injection  . TONSILLECTOMY AND ADENOIDECTOMY    . TUBAL LIGATION    .  vocal polyps removed      There were no vitals filed for this visit.  Subjective Assessment - 08/26/19 1702    Subjective  Still staying out of the kitchen. Has been doing her exercises - the hardest exercise is when she has to lift up her toes. No other falls since she was last here.    Pertinent History  pt also takes carbamezepine ER 100 mg 4x/day but not found in meds. OA, asthma, COPD, HTN, neuropathy, ant cervical fusion, breast CA, arthroscopic left knee sg, 3 frozen shoulders, Right basal joint replacement Rt hand, left middle finger fusion, carpal tunnel syndrome, follows with neuro at [facility] with long h/o epilepsy.    Patient Stated Goals  Improve her balance and gait.    Currently in Pain?  No/denies                       [facility] Adult PT Treatment/Exercise - 08/26/19 0001      Transfers   Transfers  Sit to Stand;Stand to Sit    Sit to Stand  5: Supervision    Sit to Stand Details  Verbal cues for technique    Sit to Stand Details (indicate cue type and reason)  2 x 5 reps - cues for anterior weight shift and cues to put one hand from the chair and one on rollator due to pt having tendency to have both hands on rollator     Stand to Sit  5: Supervision    Stand to Sit Details  cues for proper UE placement when sitting           Balance Exercises - 08/26/19 1723      Balance Exercises: Standing   Standing Eyes Opened  Wide (BOA);Solid surface;Head turns   4 x 5 reps head turns - min guard/min A, 1 x 5 reps head nod   Standing Eyes Closed  Wide (BOA);Solid surface;10 secs;5 reps   min guard/min A for balance   SLS  Upper extremity support 1;Upper extremity support 2;Limitations    SLS Limitations  alternating SLS taps to 4" step - 1st rep with BUE support, 2nd rep with single UE support    Wall Bumps  Hip;10 reps;Eyes opened   against wall, BUE support on rollator, cues for technique   Stepping Strategy  Lateral;Posterior;UE support;10 reps;Limitations     Stepping Strategy Limitations  10 reps each, cues for weight shift - pt only able to step back toes  posteriorly vs. whole foot     Sidestepping  Upper extremity support;3 reps;Limitations    Sidestepping Limitations  down and back 3 reps in // bars, cues for foot clearance    Other Standing Exercises  eyes open more narrow BOS, 3 x 15 second reps, min guard for balance     Other Standing Exercises Comments  lateral weight shifting R/L x10 reps with BUE support on chair           PT Short Term Goals - 08/14/19 1342      PT SHORT TERM GOAL #1   Title  Pt will be independent with initial HEP for ROM, strengthening and balance with assist of husband.    Time  4    Period  Weeks    Status  New    Target Date  09/13/19      PT SHORT TERM GOAL #2   Title  Pt will ambulate >400' on level surfaces mod I with rollator for all improved mobility.    Time  4    Period  Weeks    Status  New    Target Date  09/13/19      PT SHORT TERM GOAL #3   Title  Pt will decrease 5 x sit to stand from 16.86 sec to <12 sec from mat with UE support for improved balance.    Time  4    Period  Weeks    Status  New    Target Date  09/13/19      PT SHORT TERM GOAL #4   Title  Pt will perform transfers from varied surfaces mod I for improved safety.    Time  4    Period  Weeks    Target Date  09/13/19      PT SHORT TERM GOAL #5   Title  Pt will increase DF PROM by 10 degrees each ankle for improved mobility.    Baseline  right ankle=-25 and left=-8 degrees on 08/14/19 seated with knee bent    Time  4    Period  Weeks    Status  New    Target Date  09/13/19        PT Long Term Goals - 08/14/19 1347      PT LONG TERM GOAL #1   Title  Pt will be able to perform progressive HEP with husband to continue strength, balance and functional mobility gains on own.    Time  8    Period  Weeks    Status  New    Target Date  10/13/19      PT LONG TERM GOAL #2   Title  Pt will ambulate with rollator on  varied surfaces >500' supervision for improved community mobility.    Time  8    Period  Weeks    Status  New    Target Date  10/13/19      PT LONG TERM GOAL #3   Title  Pt will increase gait speed from 0.45m/s to >0.61m/s for improved gait safety in community.    Baseline  08/14/19    Time  8    Period  Weeks    Status  New    Target Date  10/13/19      PT LONG TERM GOAL #4   Title  Pt will decrease TUG from 19.52 sec to <16 sec for improved balance and functional mobility.    Baseline  19.52 sec on 08/14/19  Time  8    Period  Weeks    Status  New    Target Date  10/13/19      PT LONG TERM GOAL #5   Title  Pt will be able to maintain standing at counter >1 min without UE support for improved balance supervision.    Baseline  30 sec with increased sway and close SBA/CGA.    Time  8    Period  Weeks    Status  New    Target Date  10/13/19            Plan - 08/26/19 1757    Clinical Impression Statement  Focus of today's skilled session was transfer training, balance strategies. Pt continued to need verbal cueing for safety with BUE placement during transfers (as pt with tendency to have both hands on rollator). With standing activity with no UE support, pt with tendency to weight bear more on RLE. Also increased difficulty with posterior stepping strategy - only able to place toe down vs. whole foot. Will continue to progress towards LTGs.    Personal Factors and Comorbidities  Comorbidity 3+    Comorbidities  OA, COPD, HTN, neuropathy, h/o of epilepsy    Examination-Activity Limitations  Bathing;Stand;Locomotion Level;Transfers;Stairs    Examination-Participation Restrictions  Community Activity;Meal Prep    Stability/Clinical Decision Making  Evolving/Moderate complexity    Rehab Potential  Good    PT Frequency  2x / week    PT Duration  8 weeks    PT Treatment/Interventions  ADLs/Self Care Home Management;Aquatic Therapy;Cryotherapy;Moist Heat;DME Instruction;Gait  training;Stair training;Functional mobility training;Therapeutic activities;Therapeutic exercise;Orthotic Fit/Training;Patient/family education;Neuromuscular re-education;Balance training;Passive range of motion;Manual techniques    PT Next Visit Plan  add strengthening/ balance to HEP as needed and when safe to perform.  Balance exercises in standing starting on level surface. Will need to focus mostly on compensating with vision and vestibular systems due to lack of sensation in feet. Transfer training.    Consulted and Agree with Plan of Care  Patient       Patient will benefit from skilled therapeutic intervention in order to improve the following deficits and impairments:  Abnormal gait, Decreased balance, Decreased knowledge of use of DME, Decreased range of motion, Decreased mobility, Decreased strength, Impaired sensation  Visit Diagnosis: Unsteadiness on feet  Muscle weakness (generalized)  Other abnormalities of gait and mobility  Abnormal posture  Repeated falls     Problem List Patient Active Problem List   Diagnosis Date Noted  . PCP NOTES >>>>>>>>>>>>>>>>>>> 09/16/2015  . Multiple thyroid nodules 03/30/2015  . Annual physical exam 08/07/2014  . Bloody discharge from nipple - s/p surgery, resolved  12/29/2013  . Breast mass, right 12/11/2013  . At high risk for falls 12/11/2013  . Renal cysts, acquired, bilateral--per urology 05/04/2013  . Elevated LFTs 04/01/2013  . Cervical spinal stenosis 11/21/2012  . Nondiabetic gastroparesis 08/20/2012  . Hypothyroidism 08/08/2012  . Gastroparesis 05/06/2012  . Muscle cramp 01/19/2012  . Fatigue 01/14/2012  . Neurogenic bladder, prolapse ,h/o urinary retention, self cath, sees urology 11/05/2011  . Fecal incontinence 10/03/2011  . Osteoporosis 07/24/2011  . Adenoma of cecum 07/17/2011  . Chronic constipation 07/17/2011  . Skin lesion 07/08/2011  . Chronic neck pain 04/16/2011  . Rectocele 04/16/2011  . Hypersomnia--  on dextroamphetamine 04/16/2011  . Vitamin D deficiency 04/16/2011  . Family history of colon cancer 04/16/2011  . Abnormal blood chemistry 04/16/2011  . Anemia   . COPD (chronic obstructive pulmonary disease)  gold stage B   . Depression   . GERD (gastroesophageal reflux disease)   . Hyperlipidemia   . Hypertension   . Allergic rhinitis   . Pulmonary embolism-- in the 69s   . Skin cancer   . Seizures ([place])   . CIDP (chronic inflammatory demyelinating polyneuropathy) , Ataxia, Imbalance -- f/u at [facility], PT, DPT  08/26/2019, 5:59 PM  [facility] [address] [place], [place], [address] Phone: [phone]   Fax:  [phone]  Name: Reonna Wheeless MRN: [id number] Date of Birth: 07/29/1932

## 2019-09-01 ENCOUNTER — Ambulatory Visit: Payer: Medicare Other | Admitting: Physical Therapy

## 2019-09-04 ENCOUNTER — Ambulatory Visit: Payer: Medicare Other | Attending: Family Medicine | Admitting: Physical Therapy

## 2019-09-04 ENCOUNTER — Other Ambulatory Visit: Payer: Self-pay

## 2019-09-04 DIAGNOSIS — R296 Repeated falls: Secondary | ICD-10-CM | POA: Insufficient documentation

## 2019-09-04 DIAGNOSIS — R293 Abnormal posture: Secondary | ICD-10-CM | POA: Diagnosis not present

## 2019-09-04 DIAGNOSIS — R2681 Unsteadiness on feet: Secondary | ICD-10-CM | POA: Diagnosis not present

## 2019-09-04 DIAGNOSIS — R2689 Other abnormalities of gait and mobility: Secondary | ICD-10-CM | POA: Diagnosis not present

## 2019-09-04 DIAGNOSIS — M6281 Muscle weakness (generalized): Secondary | ICD-10-CM | POA: Insufficient documentation

## 2019-09-04 NOTE — Therapy (Signed)
[facility] [address] [place], [place], [address] Phone: [phone]   Fax:  [phone]  Physical Therapy Treatment  Patient Details  Name: [name] MRN: [id number] Date of Birth: 1933/05/11 Referring Provider (PT): Letta Median   Encounter Date: 09/04/2019  PT End of Session - 09/04/19 1539    Visit Number  4    Number of Visits  17    Date for PT Re-Evaluation  [id number]   90 day cert but 60 day poc   Authorization Type  medicare so 10th visit progress note    PT Start Time  1316    PT Stop Time  1358    PT Time Calculation (min)  42 min    Equipment Utilized During Treatment  Gait belt    Activity Tolerance  Patient tolerated treatment well    Behavior During Therapy  WFL for tasks assessed/performed       Past Medical History:  Diagnosis Date  . Allergic rhinitis   . Anemia   . Arthritis   . Asthma -COPD   . Blood transfusion 1957  . Breast mass    right breast in milk duct, bx neg  . Chronic fatigue syndrome   . Chronic inflammatory demyelinating polyneuropathy ([facility]) 03/2011  . chronic sinusitis 08/08/2012  . Colon polyp   . Constipation   . COPD (chronic obstructive pulmonary disease) ([facility])   . Cystocele 09/16/2012  . GERD (gastroesophageal reflux disease)   . Hyperlipidemia   . Hypertension   . Hypothyroid   . Neurogenic bladder 2013   husband caths pt, manages with timed void  . Osteoporosis   . Pulmonary embolism ([facility])    1980s  . Renal cyst    Non-complex, contrast MRI 2013 with L>R non-complex cysts, dominant 3.7 left lower pole  . Seizures ([facility])    1990 last seizures on meds Phenobarb  . Shingles late 71's  . Skin cancer    squamous cell  . Thyroid nodule    Right thyroid lobe, only seen on Sagittal imaging measures 2.3 cm in craniocaudal dimension and appears stable  . Tubular adenoma   . Vitamin D deficiency     Past Surgical History:  Procedure Laterality Date   . ABDOMINAL HYSTERECTOMY    . ANTERIOR FUSION CLIVUS-C2 EXTRAORAL W/ ODONTOID EXCISION  8/14  . APPENDECTOMY    . BASAL CELL CARCINOMA EXCISION     face  . BLADDER SUSPENSION    . BREAST DUCTAL SYSTEM EXCISION Right 01/15/2014   Procedure: EXCISION DUCTAL SYSTEM RIGHT BREAST;  Surgeon: Stark Klein, MD;  Location: [facility];  Service: General;  Laterality: Right;  . BREAST EXCISIONAL BIOPSY Right   . CARPAL TUNNEL RELEASE     right  . CATARACT EXTRACTION     bilateral  . cervical neck ablation     x 7, C3-C6/3 screws and plate  . CESAREAN SECTION    . CYSTOCELE REPAIR    . DILATION AND CURETTAGE OF UTERUS    . duptyren's contracture right hand    . FINGER ARTHRODESIS Right 05/06/2015   Procedure: RIGHT RING PROXIMAL INTERPHALANGEAL FUSION (PIP);  Surgeon: Leanora Cover, MD;  Location: [facility];  Service: Orthopedics;  Laterality: Right;  . FINGER GANGLION CYST EXCISION     right  . HEMICOLECTOMY Right   . JOINT REPLACEMENT     right and left basal joints of thumbs  . left finger fusion  3 fingers on left hand/one finger right  . left ovary and tube removed    . NASAL POLYP SURGERY     4 sinus surgeries  . NASAL SEPTUM SURGERY    . PANNICULECTOMY    . PROXIMAL INTERPHALANGEAL FUSION (PIP) Left 09/09/2013   Procedure: FUSION LEFT INDEX PROXIMAL INTERPHALANGEAL JOINT (PIP);  Surgeon: Cammie Sickle., MD;  Location: [facility];  Service: Orthopedics;  Laterality: Left;  . right median nerve decompression    . SHOULDER ARTHROSCOPY     x2 left, 1 right  . sinus surgeries     x 4  . squamous lesions removed     neck and face  . STERIOD INJECTION Right 05/06/2015   Procedure: STEROID INJECTION;  Surgeon: Leanora Cover, MD;  Location: [facility];  Service: Orthopedics;  Laterality: Right;  Right Index Finger Proximal InterPhalangeal Joint Injection  . TONSILLECTOMY AND ADENOIDECTOMY    . TUBAL LIGATION    . vocal  polyps removed      There were no vitals filed for this visit.  Subjective Assessment - 09/04/19 1318    Subjective  Fell getting of the toilet this morning. Feel backwards against the wall and hit both of her sides. Back is feeling a little sore.    Pertinent History  pt also takes carbamezepine ER 100 mg 4x/day but not found in meds. OA, asthma, COPD, HTN, neuropathy, ant cervical fusion, breast CA, arthroscopic left knee sg, 3 frozen shoulders, Right basal joint replacement Rt hand, left middle finger fusion, carpal tunnel syndrome, follows with neuro at [facility] with long h/o epilepsy.    Patient Stated Goals  Improve her balance and gait.    Currently in Pain?  No/denies                       [facility] Adult PT Treatment/Exercise - 09/04/19 0001      Transfers   Transfers  Sit to Stand;Stand to Sit    Sit to Stand  5: Supervision    Sit to Stand Details  Verbal cues for technique    Stand to Sit  5: Supervision    Stand to Sit Details  cues for eccentric control    Comments  x10 reps beginning with UE support, needed cues for proper hand placement on chair and rollator before standing, progressing to no UE support for standing or sitting from standard height chair.           Balance Exercises - 09/04/19 1341      Balance Exercises: Standing   Standing Eyes Opened  Foam/compliant surface;Wide (BOA);4 reps;20 secs   feet hip width distance    Standing Eyes Closed  Wide (BOA);Solid surface;5 reps;20 secs   pt losing balance posteriorly at times   SLS  Upper extremity support 1;Eyes open    SLS Limitations  alternating SLS taps to floor bubbles 1 x 10 reps, attempted without any UE support however pt unable to perform    Wall Bumps  Hip;10 reps;Eyes opened;Limitations    Wall Bumps Limitations  against wall with rollator anteriorly progressing from BUE support to no UE support    Stepping Strategy  Posterior;UE support;Foam/compliant surface    Stepping Strategy  Limitations  on foam beam, cues for wider BOS    Retro Gait  Upper extremity support;4 reps;Limitations    Retro Gait Limitations  cues for weight shift and wider step width    Sidestepping  Upper extremity support;2 reps;Limitations    Sidestepping Limitations  in // bars: BUE support on blue foam beam    Marching  Upper extremity assist 1;Forwards   4 reps   Other Standing Exercises  On level ground in corner: feet hip width holding balance 2 x 30 seconds, progressing to 2 x 5 reps head turns, more narrow BOS (feet not quite touching) holding 2 x 20 seconds, tried 2 x 5 reps head turns with pt needing min A to prevent from losing balance posteriorly at times.           PT Short Term Goals - 08/14/19 1342      PT SHORT TERM GOAL #1   Title  Pt will be independent with initial HEP for ROM, strengthening and balance with assist of husband.    Time  4    Period  Weeks    Status  New    Target Date  09/13/19      PT SHORT TERM GOAL #2   Title  Pt will ambulate >400' on level surfaces mod I with rollator for all improved mobility.    Time  4    Period  Weeks    Status  New    Target Date  09/13/19      PT SHORT TERM GOAL #3   Title  Pt will decrease 5 x sit to stand from 16.86 sec to <12 sec from mat with UE support for improved balance.    Time  4    Period  Weeks    Status  New    Target Date  09/13/19      PT SHORT TERM GOAL #4   Title  Pt will perform transfers from varied surfaces mod I for improved safety.    Time  4    Period  Weeks    Target Date  09/13/19      PT SHORT TERM GOAL #5   Title  Pt will increase DF PROM by 10 degrees each ankle for improved mobility.    Baseline  right ankle=-25 and left=-8 degrees on 08/14/19 seated with knee bent    Time  4    Period  Weeks    Status  New    Target Date  09/13/19        PT Long Term Goals - 08/14/19 1347      PT LONG TERM GOAL #1   Title  Pt will be able to perform progressive HEP with husband to continue  strength, balance and functional mobility gains on own.    Time  8    Period  Weeks    Status  New    Target Date  10/13/19      PT LONG TERM GOAL #2   Title  Pt will ambulate with rollator on varied surfaces >500' supervision for improved community mobility.    Time  8    Period  Weeks    Status  New    Target Date  10/13/19      PT LONG TERM GOAL #3   Title  Pt will increase gait speed from 0.23m/s to >0.89m/s for improved gait safety in community.    Baseline  08/14/19    Time  8    Period  Weeks    Status  New    Target Date  10/13/19      PT LONG TERM GOAL #4   Title  Pt will decrease TUG from 19.52 sec  to <16 sec for improved balance and functional mobility.    Baseline  19.52 sec on 08/14/19    Time  8    Period  Weeks    Status  New    Target Date  10/13/19      PT LONG TERM GOAL #5   Title  Pt will be able to maintain standing at counter >1 min without UE support for improved balance supervision.    Baseline  30 sec with increased sway and close SBA/CGA.    Time  8    Period  Weeks    Status  New    Target Date  10/13/19            Plan - 09/04/19 1539    Clinical Impression Statement  Focus of today's skilled session was transfer training and balance strategies. Pt continues to need verbal cues for proper BUE placement during transfers and for rollator brake management for safety, pt able to demo understanding, but has poor carryover - at end of session continues to perform sit <> stands with BUE support on rollator. Pt needing min A at times for corner balance with eyes closed and more narrow BOS with head motions due to posterior loss of balance. Will continue to progress towards LTGs.    Personal Factors and Comorbidities  Comorbidity 3+    Comorbidities  OA, COPD, HTN, neuropathy, h/o of epilepsy    Examination-Activity Limitations  Bathing;Stand;Locomotion Level;Transfers;Stairs    Examination-Participation Restrictions  Community Activity;Meal Prep     Stability/Clinical Decision Making  Evolving/Moderate complexity    Rehab Potential  Good    PT Frequency  2x / week    PT Duration  8 weeks    PT Treatment/Interventions  ADLs/Self Care Home Management;Aquatic Therapy;Cryotherapy;Moist Heat;DME Instruction;Gait training;Stair training;Functional mobility training;Therapeutic activities;Therapeutic exercise;Orthotic Fit/Training;Patient/family education;Neuromuscular re-education;Balance training;Passive range of motion;Manual techniques    PT Next Visit Plan  add strengthening/ balance to HEP as needed and when safe to perform.  Balance exercises in standing starting on level surface - with eyes closed and head motions, and then on compliant pillow with eyes open and static balance. Will need to focus mostly on compensating with vision and vestibular systems due to lack of sensation in feet. Transfer training.    Consulted and Agree with Plan of Care  Patient       Patient will benefit from skilled therapeutic intervention in order to improve the following deficits and impairments:  Abnormal gait, Decreased balance, Decreased knowledge of use of DME, Decreased range of motion, Decreased mobility, Decreased strength, Impaired sensation  Visit Diagnosis: Unsteadiness on feet  Muscle weakness (generalized)  Other abnormalities of gait and mobility  Abnormal posture  Repeated falls     Problem List Patient Active Problem List   Diagnosis Date Noted  . PCP NOTES >>>>>>>>>>>>>>>>>>> 09/16/2015  . Multiple thyroid nodules 03/30/2015  . Annual physical exam 08/07/2014  . Bloody discharge from nipple - s/p surgery, resolved  12/29/2013  . Breast mass, right 12/11/2013  . At high risk for falls 12/11/2013  . Renal cysts, acquired, bilateral--per urology 05/04/2013  . Elevated LFTs 04/01/2013  . Cervical spinal stenosis 11/21/2012  . Nondiabetic gastroparesis 08/20/2012  . Hypothyroidism 08/08/2012  . Gastroparesis 05/06/2012  . Muscle  cramp 01/19/2012  . Fatigue 01/14/2012  . Neurogenic bladder, prolapse ,h/o urinary retention, self cath, sees urology 11/05/2011  . Fecal incontinence 10/03/2011  . Osteoporosis 07/24/2011  . Adenoma of cecum 07/17/2011  . Chronic constipation  07/17/2011  . Skin lesion 07/08/2011  . Chronic neck pain 04/16/2011  . Rectocele 04/16/2011  . Hypersomnia-- on dextroamphetamine 04/16/2011  . Vitamin D deficiency 04/16/2011  . Family history of colon cancer 04/16/2011  . Abnormal blood chemistry 04/16/2011  . Anemia   . COPD (chronic obstructive pulmonary disease) gold stage B   . Depression   . GERD (gastroesophageal reflux disease)   . Hyperlipidemia   . Hypertension   . Allergic rhinitis   . Pulmonary embolism-- in the 35s   . Skin cancer   . Seizures ([facility])   . CIDP (chronic inflammatory demyelinating polyneuropathy) , Ataxia, Imbalance -- f/u at [facility], PT, DPT  09/04/2019, 3:44 PM  [facility] [address] [place], [place], [address] Phone: [phone]   Fax:  [phone]  Name: [name] MRN: [id number] Date of Birth: 1932/11/10

## 2019-09-05 ENCOUNTER — Telehealth: Payer: Self-pay | Admitting: Family Medicine

## 2019-09-05 NOTE — Telephone Encounter (Signed)
Patient spouse is calling and is requesting a copy of patients recent labs. CB is [phone]

## 2019-09-05 NOTE — Telephone Encounter (Signed)
Spoke w/pt's husband and informed him that lab copy is at front desk.

## 2019-09-08 ENCOUNTER — Other Ambulatory Visit: Payer: Self-pay

## 2019-09-08 ENCOUNTER — Ambulatory Visit: Payer: Medicare Other | Admitting: Physical Therapy

## 2019-09-08 DIAGNOSIS — R2681 Unsteadiness on feet: Secondary | ICD-10-CM | POA: Diagnosis not present

## 2019-09-08 DIAGNOSIS — R296 Repeated falls: Secondary | ICD-10-CM

## 2019-09-08 DIAGNOSIS — R2689 Other abnormalities of gait and mobility: Secondary | ICD-10-CM

## 2019-09-08 DIAGNOSIS — R293 Abnormal posture: Secondary | ICD-10-CM

## 2019-09-08 DIAGNOSIS — M6281 Muscle weakness (generalized): Secondary | ICD-10-CM

## 2019-09-08 NOTE — Therapy (Signed)
[facility] [address] [place], [place], [address] Phone: [phone]   Fax:  [phone]  Physical Therapy Treatment  Patient Details  Name: [name] MRN: [id number] Date of Birth: 02/24/1933 Referring Provider (PT): Letta Median   Encounter Date: 09/08/2019  PT End of Session - 09/08/19 1125    Visit Number  5    Number of Visits  17    Date for PT Re-Evaluation  [id number]   90 day cert but 60 day poc   Authorization Type  medicare so 10th visit progress note    PT Start Time  0803    PT Stop Time  0843    PT Time Calculation (min)  40 min    Equipment Utilized During Treatment  Gait belt    Activity Tolerance  Patient tolerated treatment well    Behavior During Therapy  [facility] for tasks assessed/performed       Past Medical History:  Diagnosis Date  . Allergic rhinitis   . Anemia   . Arthritis   . Asthma -COPD   . Blood transfusion 1957  . Breast mass    right breast in milk duct, bx neg  . Chronic fatigue syndrome   . Chronic inflammatory demyelinating polyneuropathy ([place]) 03/2011  . chronic sinusitis 08/08/2012  . Colon polyp   . Constipation   . COPD (chronic obstructive pulmonary disease) ([place])   . Cystocele 09/16/2012  . GERD (gastroesophageal reflux disease)   . Hyperlipidemia   . Hypertension   . Hypothyroid   . Neurogenic bladder 2013   husband caths pt, manages with timed void  . Osteoporosis   . Pulmonary embolism ([place])    1980s  . Renal cyst    Non-complex, contrast MRI 2013 with L>R non-complex cysts, dominant 3.7 left lower pole  . Seizures ([place])    1990 last seizures on meds Phenobarb  . Shingles late 54's  . Skin cancer    squamous cell  . Thyroid nodule    Right thyroid lobe, only seen on Sagittal imaging measures 2.3 cm in craniocaudal dimension and appears stable  . Tubular adenoma   . Vitamin D deficiency     Past Surgical History:  Procedure Laterality  Date  . ABDOMINAL HYSTERECTOMY    . ANTERIOR FUSION CLIVUS-C2 EXTRAORAL W/ ODONTOID EXCISION  8/14  . APPENDECTOMY    . BASAL CELL CARCINOMA EXCISION     face  . BLADDER SUSPENSION    . BREAST DUCTAL SYSTEM EXCISION Right 01/15/2014   Procedure: EXCISION DUCTAL SYSTEM RIGHT BREAST;  Surgeon: Stark Klein, MD;  Location: [facility];  Service: General;  Laterality: Right;  . BREAST EXCISIONAL BIOPSY Right   . CARPAL TUNNEL RELEASE     right  . CATARACT EXTRACTION     bilateral  . cervical neck ablation     x 7, C3-C6/3 screws and plate  . CESAREAN SECTION    . CYSTOCELE REPAIR    . DILATION AND CURETTAGE OF UTERUS    . duptyren's contracture right hand    . FINGER ARTHRODESIS Right 05/06/2015   Procedure: RIGHT RING PROXIMAL INTERPHALANGEAL FUSION (PIP);  Surgeon: Leanora Cover, MD;  Location: [facility];  Service: Orthopedics;  Laterality: Right;  . FINGER GANGLION CYST EXCISION     right  . HEMICOLECTOMY Right   . JOINT REPLACEMENT     right and left basal joints of thumbs  . left finger fusion  3 fingers on left hand/one finger right  . left ovary and tube removed    . NASAL POLYP SURGERY     4 sinus surgeries  . NASAL SEPTUM SURGERY    . PANNICULECTOMY    . PROXIMAL INTERPHALANGEAL FUSION (PIP) Left 09/09/2013   Procedure: FUSION LEFT INDEX PROXIMAL INTERPHALANGEAL JOINT (PIP);  Surgeon: Cammie Sickle., MD;  Location: [facility];  Service: Orthopedics;  Laterality: Left;  . right median nerve decompression    . SHOULDER ARTHROSCOPY     x2 left, 1 right  . sinus surgeries     x 4  . squamous lesions removed     neck and face  . STERIOD INJECTION Right 05/06/2015   Procedure: STEROID INJECTION;  Surgeon: Leanora Cover, MD;  Location: [facility];  Service: Orthopedics;  Laterality: Right;  Right Index Finger Proximal InterPhalangeal Joint Injection  . TONSILLECTOMY AND ADENOIDECTOMY    . TUBAL LIGATION    .  vocal polyps removed      There were no vitals filed for this visit.  Subjective Assessment - 09/08/19 0805    Subjective  No falls. Had a great weekend.    Pertinent History  pt also takes carbamezepine ER 100 mg 4x/day but not found in meds. OA, asthma, COPD, HTN, neuropathy, ant cervical fusion, breast CA, arthroscopic left knee sg, 3 frozen shoulders, Right basal joint replacement Rt hand, left middle finger fusion, carpal tunnel syndrome, follows with neuro at [facility] with long h/o epilepsy.    Patient Stated Goals  Improve her balance and gait.           Access Code: [email or web address] URL: [email or web address] Date: 09/08/2019 Prepared by: Janann August  Exercises Seated Toe Taps - 2 x daily - 5 x weekly - 1 sets - 10 reps Seated Gastroc Stretch with Strap - 2 x daily - 5 x weekly - 1 sets - 3 reps - 15-30 sec hold Seated Gluteal Sets - 2 x daily - 5 x weekly - 1-2 sets - 10 reps Sit to Stand with Armchair - 1 x daily - 5 x weekly - 2 sets - 3-5 reps   New additions to HEP below:  Side to Side Weight Shift with Counter Support - 1 x daily - 5 x weekly - 2 sets - 10 reps Staggered Stance Forward Backward Weight Shift with Counter Support - 1 x daily - 5 x weekly - 1 sets - 10 reps             [facility] Adult PT Treatment/Exercise - 09/08/19 0840      Transfers   Comments  pt needing cues throughout session for proper rollator management and locking brakes before and after sit <> stand transfers, also cued to keep rollator close when turning to sit in chair       Knee/Hip Exercises: Standing   Forward Step Up  Both;1 set;10 reps;Hand Hold: 2;Step Height: 4"    Forward Step Up Limitations  cues for proper technique    Other Standing Knee Exercises  standing with single UE support: 1 x 10 reps alternating toe taps to 4" step, pt with incr difficulty towards end lifting LLE back up off of step, demonstrating incr L foot scuffing, needed cues for foot clearance           Balance Exercises - 09/08/19 0832      Balance Exercises: Standing   Standing Eyes Closed  Wide (BOA);Solid surface;20 secs;10 secs;Other  reps (comment)   approx. 7 reps   Wall Bumps  Hip;10 reps;Eyes opened;Limitations    Wall Bumps Limitations  against wall with chair anteriorly, intermittent UE support, initial cues for technique    Marching  Upper extremity assist 2;10 reps;Retro;Limitations    Marching Limitations  needing one episode at countertop for min A for balance    Other Standing Exercises  on level ground with more narrow BOS (feet not quite touching) 3 x 5 reps head nods, 3 x 5 reps head turns, min guard/min A for balance        PT Education - 09/08/19 1124    Education Details  standing balance additions to HEP, rollator brake management    Person(s) Educated  Patient    Methods  Explanation;Demonstration;Handout    Comprehension  Verbalized understanding;Returned demonstration;Need further instruction       PT Short Term Goals - 08/14/19 1342      PT SHORT TERM GOAL #1   Title  Pt will be independent with initial HEP for ROM, strengthening and balance with assist of husband.    Time  4    Period  Weeks    Status  New    Target Date  09/13/19      PT SHORT TERM GOAL #2   Title  Pt will ambulate >400' on level surfaces mod I with rollator for all improved mobility.    Time  4    Period  Weeks    Status  New    Target Date  09/13/19      PT SHORT TERM GOAL #3   Title  Pt will decrease 5 x sit to stand from 16.86 sec to <12 sec from mat with UE support for improved balance.    Time  4    Period  Weeks    Status  New    Target Date  09/13/19      PT SHORT TERM GOAL #4   Title  Pt will perform transfers from varied surfaces mod I for improved safety.    Time  4    Period  Weeks    Target Date  09/13/19      PT SHORT TERM GOAL #5   Title  Pt will increase DF PROM by 10 degrees each ankle for improved mobility.    Baseline  right ankle=-25 and  left=-8 degrees on 08/14/19 seated with knee bent    Time  4    Period  Weeks    Status  New    Target Date  09/13/19        PT Long Term Goals - 08/14/19 1347      PT LONG TERM GOAL #1   Title  Pt will be able to perform progressive HEP with husband to continue strength, balance and functional mobility gains on own.    Time  8    Period  Weeks    Status  New    Target Date  10/13/19      PT LONG TERM GOAL #2   Title  Pt will ambulate with rollator on varied surfaces >500' supervision for improved community mobility.    Time  8    Period  Weeks    Status  New    Target Date  10/13/19      PT LONG TERM GOAL #3   Title  Pt will increase gait speed from 0.6m/s to >0.67m/s for improved gait safety in community.    Baseline  08/14/19    Time  8    Period  Weeks    Status  New    Target Date  10/13/19      PT LONG TERM GOAL #4   Title  Pt will decrease TUG from 19.52 sec to <16 sec for improved balance and functional mobility.    Baseline  19.52 sec on 08/14/19    Time  8    Period  Weeks    Status  New    Target Date  10/13/19      PT LONG TERM GOAL #5   Title  Pt will be able to maintain standing at counter >1 min without UE support for improved balance supervision.    Baseline  30 sec with increased sway and close SBA/CGA.    Time  8    Period  Weeks    Status  New    Target Date  10/13/19            Plan - 09/08/19 1137    Clinical Impression Statement  Added standing balance weight shifting activites with BUE support at counter to HEP. Attempted standing marching with BUE support at counter, however not added to HEP due to pt needing one episode of min A to regain balance. Pt continues to be challenged with eyes closed balance, with incr reps and a wider BOS, pt able to hold for a max of 20 seconds today. Needs continued cues throughout session for proper brake management with rollator for safety with transfers. Will continue to progress towards LTGs.    Personal  Factors and Comorbidities  Comorbidity 3+    Comorbidities  OA, COPD, HTN, neuropathy, h/o of epilepsy    Examination-Activity Limitations  Bathing;Stand;Locomotion Level;Transfers;Stairs    Examination-Participation Restrictions  Community Activity;Meal Prep    Stability/Clinical Decision Making  Evolving/Moderate complexity    Rehab Potential  Good    PT Frequency  2x / week    PT Duration  8 weeks    PT Treatment/Interventions  ADLs/Self Care Home Management;Aquatic Therapy;Cryotherapy;Moist Heat;DME Instruction;Gait training;Stair training;Functional mobility training;Therapeutic activities;Therapeutic exercise;Orthotic Fit/Training;Patient/family education;Neuromuscular re-education;Balance training;Passive range of motion;Manual techniques    PT Next Visit Plan  check STGs. how were new balance additions to HEP? Balance exercises in standing starting on level surface - with eyes closed and head motions, and then on compliant pillow with eyes open and static balance. Activities for LLE foot clearance. Will need to focus mostly on compensating with vision and vestibular systems due to lack of sensation in feet. Transfer training.    Consulted and Agree with Plan of Care  Patient       Patient will benefit from skilled therapeutic intervention in order to improve the following deficits and impairments:  Abnormal gait, Decreased balance, Decreased knowledge of use of DME, Decreased range of motion, Decreased mobility, Decreased strength, Impaired sensation  Visit Diagnosis: Unsteadiness on feet  Muscle weakness (generalized)  Other abnormalities of gait and mobility  Abnormal posture  Repeated falls     Problem List Patient Active Problem List   Diagnosis Date Noted  . PCP NOTES >>>>>>>>>>>>>>>>>>> 09/16/2015  . Multiple thyroid nodules 03/30/2015  . Annual physical exam 08/07/2014  . Bloody discharge from nipple - s/p surgery, resolved  12/29/2013  . Breast mass, right  12/11/2013  . At high risk for falls 12/11/2013  . Renal cysts, acquired, bilateral--per urology 05/04/2013  . Elevated LFTs 04/01/2013  . Cervical spinal stenosis 11/21/2012  . Nondiabetic gastroparesis 08/20/2012  .  Hypothyroidism 08/08/2012  . Gastroparesis 05/06/2012  . Muscle cramp 01/19/2012  . Fatigue 01/14/2012  . Neurogenic bladder, prolapse ,h/o urinary retention, self cath, sees urology 11/05/2011  . Fecal incontinence 10/03/2011  . Osteoporosis 07/24/2011  . Adenoma of cecum 07/17/2011  . Chronic constipation 07/17/2011  . Skin lesion 07/08/2011  . Chronic neck pain 04/16/2011  . Rectocele 04/16/2011  . Hypersomnia-- on dextroamphetamine 04/16/2011  . Vitamin D deficiency 04/16/2011  . Family history of colon cancer 04/16/2011  . Abnormal blood chemistry 04/16/2011  . Anemia   . COPD (chronic obstructive pulmonary disease) gold stage B   . Depression   . GERD (gastroesophageal reflux disease)   . Hyperlipidemia   . Hypertension   . Allergic rhinitis   . Pulmonary embolism-- in the 44s   . Skin cancer   . Seizures ([place])   . CIDP (chronic inflammatory demyelinating polyneuropathy) , Ataxia, Imbalance -- f/u at [facility], PT, DPT  09/08/2019, 11:41 AM  [facility] [address] [place], [place], [address] Phone: [phone]   Fax:  [phone]  Name: Nimsi Filley MRN: [id number] Date of Birth: 1933/01/11

## 2019-09-08 NOTE — Patient Instructions (Signed)
Access Code: [email or web address] URL: [email or web address] Date: 09/08/2019 Prepared by: Janann August  Exercises Seated Toe Taps - 2 x daily - 5 x weekly - 1 sets - 10 reps Seated Gastroc Stretch with Strap - 2 x daily - 5 x weekly - 1 sets - 3 reps - 15-30 sec hold Seated Gluteal Sets - 2 x daily - 5 x weekly - 1-2 sets - 10 reps Sit to Stand with Armchair - 1 x daily - 5 x weekly - 2 sets - 3-5 reps Side to Side Weight Shift with Counter Support - 1 x daily - 5 x weekly - 2 sets - 10 reps Staggered Stance Forward Backward Weight Shift with Counter Support - 1 x daily - 5 x weekly - 1 sets - 10 reps

## 2019-09-11 ENCOUNTER — Ambulatory Visit: Payer: Medicare Other

## 2019-09-15 ENCOUNTER — Other Ambulatory Visit: Payer: Self-pay

## 2019-09-15 ENCOUNTER — Ambulatory Visit: Payer: Medicare Other

## 2019-09-15 DIAGNOSIS — R2681 Unsteadiness on feet: Secondary | ICD-10-CM | POA: Diagnosis not present

## 2019-09-15 DIAGNOSIS — M6281 Muscle weakness (generalized): Secondary | ICD-10-CM

## 2019-09-15 DIAGNOSIS — R2689 Other abnormalities of gait and mobility: Secondary | ICD-10-CM

## 2019-09-15 NOTE — Patient Instructions (Signed)
Access Code: [email or web address] URL: [email or web address] Date: 09/15/2019 Prepared by: Cherly Anderson  Exercises Seated Toe Taps - 2 x daily - 5 x weekly - 1 sets - 10 reps Seated Gastroc Stretch with Strap - 2 x daily - 5 x weekly - 1 sets - 3 reps - 15-30 sec hold Seated Gluteal Sets - 2 x daily - 5 x weekly - 1-2 sets - 10 reps Sit to Stand with Armchair - 1 x daily - 5 x weekly - 2 sets - 3-5 reps Side to Side Weight Shift with Counter Support - 1 x daily - 5 x weekly - 2 sets - 10 reps Staggered Stance Forward Backward Weight Shift with Counter Support - 1 x daily - 5 x weekly - 1 sets - 10 reps Standing Gastroc Stretch at Counter - 1 x daily - 7 x weekly - 4 sets - 1 reps - 30 hold Side Stepping with Counter Support - 1 x daily - 7 x weekly - 1 sets - 2-3 reps

## 2019-09-15 NOTE — Therapy (Signed)
[facility] [address] [place], [place], [address] Phone: [phone]   Fax:  [phone]  Physical Therapy Treatment  Patient Details  Name: [name] MRN: [id number] Date of Birth: 1932/08/21 Referring Provider (PT): Letta Median   Encounter Date: 09/15/2019  PT End of Session - 09/15/19 1323    Visit Number  6    Number of Visits  17    Date for PT Re-Evaluation  09/32/67   90 day cert but 60 day poc   Authorization Type  medicare so 10th visit progress note    PT Start Time  1318    PT Stop Time  1401    PT Time Calculation (min)  43 min    Equipment Utilized During Treatment  Gait belt    Activity Tolerance  Patient tolerated treatment well    Behavior During Therapy  WFL for tasks assessed/performed       Past Medical History:  Diagnosis Date  . Allergic rhinitis   . Anemia   . Arthritis   . Asthma -COPD   . Blood transfusion 1957  . Breast mass    right breast in milk duct, bx neg  . Chronic fatigue syndrome   . Chronic inflammatory demyelinating polyneuropathy ([place]) 03/2011  . chronic sinusitis 08/08/2012  . Colon polyp   . Constipation   . COPD (chronic obstructive pulmonary disease) ([place])   . Cystocele 09/16/2012  . GERD (gastroesophageal reflux disease)   . Hyperlipidemia   . Hypertension   . Hypothyroid   . Neurogenic bladder 2013   husband caths pt, manages with timed void  . Osteoporosis   . Pulmonary embolism ([place])    1980s  . Renal cyst    Non-complex, contrast MRI 2013 with L>R non-complex cysts, dominant 3.7 left lower pole  . Seizures ([place])    1990 last seizures on meds Phenobarb  . Shingles late 77's  . Skin cancer    squamous cell  . Thyroid nodule    Right thyroid lobe, only seen on Sagittal imaging measures 2.3 cm in craniocaudal dimension and appears stable  . Tubular adenoma   . Vitamin D deficiency     Past Surgical History:  Procedure Laterality  Date  . ABDOMINAL HYSTERECTOMY    . ANTERIOR FUSION CLIVUS-C2 EXTRAORAL W/ ODONTOID EXCISION  8/14  . APPENDECTOMY    . BASAL CELL CARCINOMA EXCISION     face  . BLADDER SUSPENSION    . BREAST DUCTAL SYSTEM EXCISION Right 01/15/2014   Procedure: EXCISION DUCTAL SYSTEM RIGHT BREAST;  Surgeon: Stark Klein, MD;  Location: [facility];  Service: General;  Laterality: Right;  . BREAST EXCISIONAL BIOPSY Right   . CARPAL TUNNEL RELEASE     right  . CATARACT EXTRACTION     bilateral  . cervical neck ablation     x 7, C3-C6/3 screws and plate  . CESAREAN SECTION    . CYSTOCELE REPAIR    . DILATION AND CURETTAGE OF UTERUS    . duptyren's contracture right hand    . FINGER ARTHRODESIS Right 05/06/2015   Procedure: RIGHT RING PROXIMAL INTERPHALANGEAL FUSION (PIP);  Surgeon: Leanora Cover, MD;  Location: [facility];  Service: Orthopedics;  Laterality: Right;  . FINGER GANGLION CYST EXCISION     right  . HEMICOLECTOMY Right   . JOINT REPLACEMENT     right and left basal joints of thumbs  . left finger fusion  3 fingers on left hand/one finger right  . left ovary and tube removed    . NASAL POLYP SURGERY     4 sinus surgeries  . NASAL SEPTUM SURGERY    . PANNICULECTOMY    . PROXIMAL INTERPHALANGEAL FUSION (PIP) Left 09/09/2013   Procedure: FUSION LEFT INDEX PROXIMAL INTERPHALANGEAL JOINT (PIP);  Surgeon: Cammie Sickle., MD;  Location: [facility];  Service: Orthopedics;  Laterality: Left;  . right median nerve decompression    . SHOULDER ARTHROSCOPY     x2 left, 1 right  . sinus surgeries     x 4  . squamous lesions removed     neck and face  . STERIOD INJECTION Right 05/06/2015   Procedure: STEROID INJECTION;  Surgeon: Leanora Cover, MD;  Location: [facility];  Service: Orthopedics;  Laterality: Right;  Right Index Finger Proximal InterPhalangeal Joint Injection  . TONSILLECTOMY AND ADENOIDECTOMY    . TUBAL LIGATION    .  vocal polyps removed      There were no vitals filed for this visit.  Subjective Assessment - 09/15/19 1322    Subjective  Pt reports that she still has not had any falls. Report new exercises are going well.    Currently in Pain?  No/denies         [facility] PT Assessment - 09/15/19 1335      ROM / Strength   AROM / PROM / Strength  AROM;PROM      AROM   AROM Assessment Site  Ankle    Right/Left Ankle  Right;Left    Right Ankle Dorsiflexion  -14    Left Ankle Dorsiflexion  -18      PROM   PROM Assessment Site  Ankle    Right/Left Ankle  Right;Left    Right Ankle Dorsiflexion  -2    Left Ankle Dorsiflexion  -4                   [facility] Adult PT Treatment/Exercise - 09/15/19 1335      Transfers   Transfers  Sit to Stand;Stand to Sit    Sit to Stand  5: Supervision    Five time sit to stand comments   29 sec from mat with UE support    Stand to Sit  5: Supervision    Comments  Pt performed sit to stand 5 x 2 from standard chair with armrests supervision      Ambulation/Gait   Ambulation/Gait  Yes    Ambulation/Gait Assistance  6: Modified independent (Device/Increase time)    Ambulation Distance (Feet)  460 Feet    Assistive device  4-wheeled walker    Gait Pattern  Step-through pattern;Decreased dorsiflexion - right;Decreased dorsiflexion - left    Ambulation Surface  Level;Indoor      Neuro Re-ed    Neuro Re-ed Details   At counter: Standing with weight shifting side to side x 10 then weight shifting ant/post from staggered stance position with bilateral UE support, sidestepping along counter with UE support 8' x 4. Standing with feet apart without UE support x 30 sec then adding in head turns up/down and left/right x 10 each CGA for safety. Standing with feet together without UE support x 30 sec CGA with increased sway. Standing on airex x 30 sec feet apart CGA then with holding to counter weight shifting ant/post on airex x 10 with poor control going backwards  needing min/mod assist at times to regain. Pt  was instructed to only shift as far as she could control balance.       Exercises   Exercises  Other Exercises    Other Exercises   Standing gastroc stretch 30 sec x 2 each leg with verbal and visual cues for form.             PT Education - 09/15/19 1654    Education Details  Added to HEP for standing gastroc stretch and side stepping at counter.    Person(s) Educated  Patient    Methods  Explanation;Demonstration;Handout    Comprehension  Verbalized understanding;Returned demonstration       PT Short Term Goals - 09/15/19 1323      PT SHORT TERM GOAL #1   Title  Pt will be independent with initial HEP for ROM, strengthening and balance with assist of husband.    Baseline  Pt reports that she has been doing her exercises 2x/day as instructed    Time  4    Period  Weeks    Status  Achieved    Target Date  09/13/19      PT SHORT TERM GOAL #2   Title  Pt will ambulate >400' on level surfaces mod I with rollator for all improved mobility.    Baseline  09/15/19 460' with rollator mod I    Time  4    Period  Weeks    Status  Achieved    Target Date  09/13/19      PT SHORT TERM GOAL #3   Title  Pt will decrease 5 x sit to stand from 16.86 sec to <12 sec from mat with UE support for improved balance.    Baseline  29 sec on 5 x sit to stand from mat on 09/15/19    Time  4    Period  Weeks    Status  Not Met    Target Date  09/13/19      PT SHORT TERM GOAL #4   Title  Pt will perform transfers from varied surfaces mod I for improved safety.    Baseline  Pt still needs supervision for safety    Time  4    Period  Weeks    Status  Not Met    Target Date  09/13/19      PT SHORT TERM GOAL #5   Title  Pt will increase DF PROM by 10 degrees each ankle for improved mobility.    Baseline  right ankle=-25 and left=-8 degrees on 08/14/19 seated with knee bent. 4/19/21right ankle A/PROM=-14/-2,  left ankle DF=-18/-4 degrees    Time  4     Period  Weeks    Status  Partially Met    Target Date  09/13/19        PT Long Term Goals - 08/14/19 1347      PT LONG TERM GOAL #1   Title  Pt will be able to perform progressive HEP with husband to continue strength, balance and functional mobility gains on own.    Time  8    Period  Weeks    Status  New    Target Date  10/13/19      PT LONG TERM GOAL #2   Title  Pt will ambulate with rollator on varied surfaces >500' supervision for improved community mobility.    Time  8    Period  Weeks    Status  New    Target Date  10/13/19  PT LONG TERM GOAL #3   Title  Pt will increase gait speed from 0.53ms to >0.733m for improved gait safety in community.    Baseline  08/14/19    Time  8    Period  Weeks    Status  New    Target Date  10/13/19      PT LONG TERM GOAL #4   Title  Pt will decrease TUG from 19.52 sec to <16 sec for improved balance and functional mobility.    Baseline  19.52 sec on 08/14/19    Time  8    Period  Weeks    Status  New    Target Date  10/13/19      PT LONG TERM GOAL #5   Title  Pt will be able to maintain standing at counter >1 min without UE support for improved balance supervision.    Baseline  30 sec with increased sway and close SBA/CGA.    Time  8    Period  Weeks    Status  New    Target Date  10/13/19            Plan - 09/15/19 1659    Clinical Impression Statement  Pt's STG were reassessed today. Pt met HEP and gait goal with rollator. She still needs supervision at times with transfers as needs cuing for hand placement. Pt was not able to improve her 5 x sit to stand goal. She is showing improvement in ankle ROM but short of goal. Pt continues to benefit from skilled PT to further progress gait, balance, strength and functional mobility.    Personal Factors and Comorbidities  Comorbidity 3+    Comorbidities  OA, COPD, HTN, neuropathy, h/o of epilepsy    Examination-Activity Limitations  Bathing;Stand;Locomotion  Level;Transfers;Stairs    Examination-Participation Restrictions  Community Activity;Meal Prep    Stability/Clinical Decision Making  Evolving/Moderate complexity    Rehab Potential  Good    PT Frequency  2x / week    PT Duration  8 weeks    PT Treatment/Interventions  ADLs/Self Care Home Management;Aquatic Therapy;Cryotherapy;Moist Heat;DME Instruction;Gait training;Stair training;Functional mobility training;Therapeutic activities;Therapeutic exercise;Orthotic Fit/Training;Patient/family education;Neuromuscular re-education;Balance training;Passive range of motion;Manual techniques    PT Next Visit Plan  Balance exercises in standing starting on level surface - with eyes closed and head motions, and then on compliant pillow with eyes open and static balance. Will need to focus mostly on compensating with vision and vestibular systems due to lack of sensation in feet. Transfer training.    Consulted and Agree with Plan of Care  Patient       Patient will benefit from skilled therapeutic intervention in order to improve the following deficits and impairments:  Abnormal gait, Decreased balance, Decreased knowledge of use of DME, Decreased range of motion, Decreased mobility, Decreased strength, Impaired sensation  Visit Diagnosis: Other abnormalities of gait and mobility  Muscle weakness (generalized)     Problem List Patient Active Problem List   Diagnosis Date Noted  . PCP NOTES >>>>>>>>>>>>>>>>>>> 09/16/2015  . Multiple thyroid nodules 03/30/2015  . Annual physical exam 08/07/2014  . Bloody discharge from nipple - s/p surgery, resolved  12/29/2013  . Breast mass, right 12/11/2013  . At high risk for falls 12/11/2013  . Renal cysts, acquired, bilateral--per urology 05/04/2013  . Elevated LFTs 04/01/2013  . Cervical spinal stenosis 11/21/2012  . Nondiabetic gastroparesis 08/20/2012  . Hypothyroidism 08/08/2012  . Gastroparesis 05/06/2012  . Muscle cramp 01/19/2012  . Fatigue  01/14/2012  . Neurogenic bladder, prolapse ,h/o urinary retention, self cath, sees urology 11/05/2011  . Fecal incontinence 10/03/2011  . Osteoporosis 07/24/2011  . Adenoma of cecum 07/17/2011  . Chronic constipation 07/17/2011  . Skin lesion 07/08/2011  . Chronic neck pain 04/16/2011  . Rectocele 04/16/2011  . Hypersomnia-- on dextroamphetamine 04/16/2011  . Vitamin D deficiency 04/16/2011  . Family history of colon cancer 04/16/2011  . Abnormal blood chemistry 04/16/2011  . Anemia   . COPD (chronic obstructive pulmonary disease) gold stage B   . Depression   . GERD (gastroesophageal reflux disease)   . Hyperlipidemia   . Hypertension   . Allergic rhinitis   . Pulmonary embolism-- in the 41s   . Skin cancer   . Seizures ([place])   . CIDP (chronic inflammatory demyelinating polyneuropathy) , Ataxia, Imbalance -- f/u at [facility], PT, DPT, NCS 09/15/2019, 5:04 PM  [facility] [address] [place], [place], [address] Phone: [phone]   Fax:  [phone]  Name: Therese Rocco MRN: [id number] Date of Birth: May 07, 1933

## 2019-09-18 ENCOUNTER — Encounter: Payer: Self-pay | Admitting: Physical Therapy

## 2019-09-18 ENCOUNTER — Ambulatory Visit: Payer: Medicare Other | Admitting: Physical Therapy

## 2019-09-18 ENCOUNTER — Other Ambulatory Visit: Payer: Self-pay

## 2019-09-18 DIAGNOSIS — R1313 Dysphagia, pharyngeal phase: Secondary | ICD-10-CM | POA: Diagnosis not present

## 2019-09-18 DIAGNOSIS — R2681 Unsteadiness on feet: Secondary | ICD-10-CM | POA: Diagnosis not present

## 2019-09-18 DIAGNOSIS — M6281 Muscle weakness (generalized): Secondary | ICD-10-CM

## 2019-09-18 DIAGNOSIS — R296 Repeated falls: Secondary | ICD-10-CM

## 2019-09-18 DIAGNOSIS — T17308A Unspecified foreign body in larynx causing other injury, initial encounter: Secondary | ICD-10-CM | POA: Diagnosis not present

## 2019-09-18 DIAGNOSIS — R2689 Other abnormalities of gait and mobility: Secondary | ICD-10-CM

## 2019-09-18 DIAGNOSIS — R633 Feeding difficulties: Secondary | ICD-10-CM | POA: Diagnosis not present

## 2019-09-18 DIAGNOSIS — R293 Abnormal posture: Secondary | ICD-10-CM

## 2019-09-18 NOTE — Therapy (Signed)
[facility] [address] [place], [place], [address] Phone: [phone]   Fax:  [phone]  Physical Therapy Treatment  Patient Details  Name: [name] MRN: [id number] Date of Birth: [birth year] Referring Provider (PT): Letta Median   Encounter Date: 09/18/2019  PT End of Session - 09/18/19 1203    Visit Number  7    Number of Visits  17    Date for PT Re-Evaluation  91/91/66   90 day cert but 60 day poc   Authorization Type  medicare so 10th visit progress note    PT Start Time  1016    PT Stop Time  1100    PT Time Calculation (min)  44 min    Equipment Utilized During Treatment  Gait belt    Activity Tolerance  Patient tolerated treatment well    Behavior During Therapy  [facility] for tasks assessed/performed       Past Medical History:  Diagnosis Date  . Allergic rhinitis   . Anemia   . Arthritis   . Asthma -COPD   . Blood transfusion 1957  . Breast mass    right breast in milk duct, bx neg  . Chronic fatigue syndrome   . Chronic inflammatory demyelinating polyneuropathy ([place]) 03/2011  . chronic sinusitis 08/08/2012  . Colon polyp   . Constipation   . COPD (chronic obstructive pulmonary disease) ([place])   . Cystocele 09/16/2012  . GERD (gastroesophageal reflux disease)   . Hyperlipidemia   . Hypertension   . Hypothyroid   . Neurogenic bladder 2013   husband caths pt, manages with timed void  . Osteoporosis   . Pulmonary embolism ([place])    1980s  . Renal cyst    Non-complex, contrast MRI 2013 with L>R non-complex cysts, dominant 3.7 left lower pole  . Seizures ([place])    1990 last seizures on meds Phenobarb  . Shingles late 15's  . Skin cancer    squamous cell  . Thyroid nodule    Right thyroid lobe, only seen on Sagittal imaging measures 2.3 cm in craniocaudal dimension and appears stable  . Tubular adenoma   . Vitamin D deficiency     Past Surgical History:  Procedure Laterality  Date  . ABDOMINAL HYSTERECTOMY    . ANTERIOR FUSION CLIVUS-C2 EXTRAORAL W/ ODONTOID EXCISION  8/14  . APPENDECTOMY    . BASAL CELL CARCINOMA EXCISION     face  . BLADDER SUSPENSION    . BREAST DUCTAL SYSTEM EXCISION Right 01/15/2014   Procedure: EXCISION DUCTAL SYSTEM RIGHT BREAST;  Surgeon: Stark Klein, MD;  Location: [facility];  Service: General;  Laterality: Right;  . BREAST EXCISIONAL BIOPSY Right   . CARPAL TUNNEL RELEASE     right  . CATARACT EXTRACTION     bilateral  . cervical neck ablation     x 7, C3-C6/3 screws and plate  . CESAREAN SECTION    . CYSTOCELE REPAIR    . DILATION AND CURETTAGE OF UTERUS    . duptyren's contracture right hand    . FINGER ARTHRODESIS Right 05/06/2015   Procedure: RIGHT RING PROXIMAL INTERPHALANGEAL FUSION (PIP);  Surgeon: Leanora Cover, MD;  Location: [facility];  Service: Orthopedics;  Laterality: Right;  . FINGER GANGLION CYST EXCISION     right  . HEMICOLECTOMY Right   . JOINT REPLACEMENT     right and left basal joints of thumbs  . left finger fusion  3 fingers on left hand/one finger right  . left ovary and tube removed    . NASAL POLYP SURGERY     4 sinus surgeries  . NASAL SEPTUM SURGERY    . PANNICULECTOMY    . PROXIMAL INTERPHALANGEAL FUSION (PIP) Left 09/09/2013   Procedure: FUSION LEFT INDEX PROXIMAL INTERPHALANGEAL JOINT (PIP);  Surgeon: Cammie Sickle., MD;  Location: [facility];  Service: Orthopedics;  Laterality: Left;  . right median nerve decompression    . SHOULDER ARTHROSCOPY     x2 left, 1 right  . sinus surgeries     x 4  . squamous lesions removed     neck and face  . STERIOD INJECTION Right 05/06/2015   Procedure: STEROID INJECTION;  Surgeon: Leanora Cover, MD;  Location: [facility];  Service: Orthopedics;  Laterality: Right;  Right Index Finger Proximal InterPhalangeal Joint Injection  . TONSILLECTOMY AND ADENOIDECTOMY    . TUBAL LIGATION    .  vocal polyps removed      There were no vitals filed for this visit.  Subjective Assessment - 09/18/19 1019    Subjective  Exercises are going well. No falls.    Pertinent History  pt also takes carbamezepine ER 100 mg 4x/day but not found in meds. OA, asthma, COPD, HTN, neuropathy, ant cervical fusion, breast CA, arthroscopic left knee sg, 3 frozen shoulders, Right basal joint replacement Rt hand, left middle finger fusion, carpal tunnel syndrome, follows with neuro at [facility] with long h/o epilepsy.    Patient Stated Goals  Improve her balance and gait.    Currently in Pain?  No/denies                       [facility] Adult PT Treatment/Exercise - 09/18/19 0001      Transfers   Transfers  Sit to Stand;Stand to Sit    Sit to Stand  4: Min guard    Sit to Stand Details  Verbal cues for technique    Sit to Stand Details (indicate cue type and reason)  Performed sit<> stands on single floor pillow x10 reps (pushing off from mat) and maintaining balance with no UE support in standing and then slowly lowering down to mat table.    Stand to Sit  4: Min guard;Without upper extremity assist;To bed    Comments   Pt needing cues throughout session for proper brake management for safety before and after sit <> stands as pt still forgets to keep brakes locked prior to standing, also needs cues when performing activities at countertop to lock brakes first before reaching towards counter and or parallel bars. Put a piece of white tape with written '[place]' on pt's rollator for additional visual cue reminder as pt have poor safety awareness with decr carryover.       Exercises   Exercises  Other Exercises    Other Exercises   at edge of mat with BUE support on rollator: 1 x 10 reps mini squats with cues for proper technique, reviewed standing gastroc stretch given as HEP at last session, needed visual and verbal cues for technique 2 x 30 seconds B          Balance Exercises - 09/18/19  1204      Balance Exercises: Standing   Standing Eyes Opened  Foam/compliant surface;Wide (BOA);4 reps;20 secs   plus additional single rep of 30 seconds, in corner   Wall Bumps  Hip;10 reps;Eyes  opened;Limitations    Wall Bumps Limitations  in corner against wall on blue foam -x5 reps with BUE support, x5 reps with no UE support, cues for technique     Sidestepping  Upper extremity support    Sidestepping Limitations  in // bars x10 reps B stepping over black foam beam at smallest height - cues for weight shift and foot clearance and to maintain a wider BOS when returning to midline    Other Standing Exercises Comments  on blue foam in corner with BUE support: A/P weight shifting on foam pad x10 reps, 2 x 10 reps head turns, 2 x 10 reps head nods         PT Education - 09/18/19 1203    Education Details  reviewed standing gastroc stretch, continued to review safety with transfers with rollator    Person(s) Educated  Patient    Methods  Explanation;Demonstration    Comprehension  Verbalized understanding;Returned demonstration;Need further instruction       PT Short Term Goals - 09/15/19 1323      PT SHORT TERM GOAL #1   Title  Pt will be independent with initial HEP for ROM, strengthening and balance with assist of husband.    Baseline  Pt reports that she has been doing her exercises 2x/day as instructed    Time  4    Period  Weeks    Status  Achieved    Target Date  09/13/19      PT SHORT TERM GOAL #2   Title  Pt will ambulate >400' on level surfaces mod I with rollator for all improved mobility.    Baseline  09/15/19 460' with rollator mod I    Time  4    Period  Weeks    Status  Achieved    Target Date  09/13/19      PT SHORT TERM GOAL #3   Title  Pt will decrease 5 x sit to stand from 16.86 sec to <12 sec from mat with UE support for improved balance.    Baseline  29 sec on 5 x sit to stand from mat on 09/15/19    Time  4    Period  Weeks    Status  Not Met    Target  Date  09/13/19      PT SHORT TERM GOAL #4   Title  Pt will perform transfers from varied surfaces mod I for improved safety.    Baseline  Pt still needs supervision for safety    Time  4    Period  Weeks    Status  Not Met    Target Date  09/13/19      PT SHORT TERM GOAL #5   Title  Pt will increase DF PROM by 10 degrees each ankle for improved mobility.    Baseline  right ankle=-25 and left=-8 degrees on 08/14/19 seated with knee bent. 4/19/21right ankle A/PROM=-14/-2,  left ankle DF=-18/-4 degrees    Time  4    Period  Weeks    Status  Partially Met    Target Date  09/13/19        PT Long Term Goals - 08/14/19 1347      PT LONG TERM GOAL #1   Title  Pt will be able to perform progressive HEP with husband to continue strength, balance and functional mobility gains on own.    Time  8    Period  Weeks    Status  New    Target Date  10/13/19      PT LONG TERM GOAL #2   Title  Pt will ambulate with rollator on varied surfaces >500' supervision for improved community mobility.    Time  8    Period  Weeks    Status  New    Target Date  10/13/19      PT LONG TERM GOAL #3   Title  Pt will increase gait speed from 0.70ms to >0.756m for improved gait safety in community.    Baseline  08/14/19    Time  8    Period  Weeks    Status  New    Target Date  10/13/19      PT LONG TERM GOAL #4   Title  Pt will decrease TUG from 19.52 sec to <16 sec for improved balance and functional mobility.    Baseline  19.52 sec on 08/14/19    Time  8    Period  Weeks    Status  New    Target Date  10/13/19      PT LONG TERM GOAL #5   Title  Pt will be able to maintain standing at counter >1 min without UE support for improved balance supervision.    Baseline  30 sec with increased sway and close SBA/CGA.    Time  8    Period  Weeks    Status  New    Target Date  10/13/19            Plan - 09/18/19 1212    Clinical Impression Statement  Focus of today's skilled session continued  to focus on transfer training, BLE strengthening, and balance strategies on compliant surfaces. Pt continues to lack decr safety awareness with brake management with rollator with transfers and has decreased carry over between sessions, pt will forget to lock brakes before transferring putting pt at an increased risk for falls. Added a visual cue to pt's brakes for an additional reminder to make sure brakes are locked. Will continue to progress towards LTGs.    Personal Factors and Comorbidities  Comorbidity 3+    Comorbidities  OA, COPD, HTN, neuropathy, h/o of epilepsy    Examination-Activity Limitations  Bathing;Stand;Locomotion Level;Transfers;Stairs    Examination-Participation Restrictions  Community Activity;Meal Prep    Stability/Clinical Decision Making  Evolving/Moderate complexity    Rehab Potential  Good    PT Frequency  2x / week    PT Duration  8 weeks    PT Treatment/Interventions  ADLs/Self Care Home Management;Aquatic Therapy;Cryotherapy;Moist Heat;DME Instruction;Gait training;Stair training;Functional mobility training;Therapeutic activities;Therapeutic exercise;Orthotic Fit/Training;Patient/family education;Neuromuscular re-education;Balance training;Passive range of motion;Manual techniques    PT Next Visit Plan  Balance exercises in standing starting on level surface - with eyes closed and head motions, and then on compliant pillow with eyes open and static balance. Will need to focus mostly on compensating with vision and vestibular systems due to lack of sensation in feet. Transfer training.    Consulted and Agree with Plan of Care  Patient       Patient will benefit from skilled therapeutic intervention in order to improve the following deficits and impairments:  Abnormal gait, Decreased balance, Decreased knowledge of use of DME, Decreased range of motion, Decreased mobility, Decreased strength, Impaired sensation  Visit Diagnosis: Muscle weakness (generalized)  Other  abnormalities of gait and mobility  Unsteadiness on feet  Repeated falls  Abnormal posture     Problem List Patient Active Problem List  Diagnosis Date Noted  . PCP NOTES >>>>>>>>>>>>>>>>>>> 09/16/2015  . Multiple thyroid nodules 03/30/2015  . Annual physical exam 08/07/2014  . Bloody discharge from nipple - s/p surgery, resolved  12/29/2013  . Breast mass, right 12/11/2013  . At high risk for falls 12/11/2013  . Renal cysts, acquired, bilateral--per urology 05/04/2013  . Elevated LFTs 04/01/2013  . Cervical spinal stenosis 11/21/2012  . Nondiabetic gastroparesis 08/20/2012  . Hypothyroidism 08/08/2012  . Gastroparesis 05/06/2012  . Muscle cramp 01/19/2012  . Fatigue 01/14/2012  . Neurogenic bladder, prolapse ,h/o urinary retention, self cath, sees urology 11/05/2011  . Fecal incontinence 10/03/2011  . Osteoporosis 07/24/2011  . Adenoma of cecum 07/17/2011  . Chronic constipation 07/17/2011  . Skin lesion 07/08/2011  . Chronic neck pain 04/16/2011  . Rectocele 04/16/2011  . Hypersomnia-- on dextroamphetamine 04/16/2011  . Vitamin D deficiency 04/16/2011  . Family history of colon cancer 04/16/2011  . Abnormal blood chemistry 04/16/2011  . Anemia   . COPD (chronic obstructive pulmonary disease) gold stage B   . Depression   . GERD (gastroesophageal reflux disease)   . Hyperlipidemia   . Hypertension   . Allergic rhinitis   . Pulmonary embolism-- in the 75s   . Skin cancer   . Seizures ([place])   . CIDP (chronic inflammatory demyelinating polyneuropathy) , Ataxia, Imbalance -- f/u at [facility], PT, DPT  09/18/2019, 12:13 PM  [facility] [address] [place], [place], [address] Phone: [phone]   Fax:  [phone]  Name: [name] MRN: [id number] Date of Birth: 01-26-1933

## 2019-09-22 ENCOUNTER — Other Ambulatory Visit: Payer: Self-pay

## 2019-09-22 ENCOUNTER — Ambulatory Visit: Payer: Medicare Other | Admitting: Physical Therapy

## 2019-09-22 DIAGNOSIS — R296 Repeated falls: Secondary | ICD-10-CM

## 2019-09-22 DIAGNOSIS — M6281 Muscle weakness (generalized): Secondary | ICD-10-CM

## 2019-09-22 DIAGNOSIS — R2681 Unsteadiness on feet: Secondary | ICD-10-CM

## 2019-09-22 DIAGNOSIS — R2689 Other abnormalities of gait and mobility: Secondary | ICD-10-CM

## 2019-09-22 NOTE — Therapy (Signed)
[facility] [address] [place], [place], [address] Phone: [phone]   Fax:  [phone]  Physical Therapy Treatment  Patient Details  Name: [name] MRN: [id number] Date of Birth: April 29, 1933 Referring Provider (PT): Letta Median   Encounter Date: 09/22/2019  PT End of Session - 09/22/19 2043    Visit Number  8    Number of Visits  17    Date for PT Re-Evaluation  94/50/38   90 day cert but 60 day poc   Authorization Type  medicare so 10th visit progress note    PT Start Time  1448    PT Stop Time  1528    PT Time Calculation (min)  40 min    Equipment Utilized During Treatment  Gait belt    Activity Tolerance  Patient tolerated treatment well    Behavior During Therapy  [facility] for tasks assessed/performed       Past Medical History:  Diagnosis Date  . Allergic rhinitis   . Anemia   . Arthritis   . Asthma -COPD   . Blood transfusion 1957  . Breast mass    right breast in milk duct, bx neg  . Chronic fatigue syndrome   . Chronic inflammatory demyelinating polyneuropathy ([place]) 03/2011  . chronic sinusitis 08/08/2012  . Colon polyp   . Constipation   . COPD (chronic obstructive pulmonary disease) ([place])   . Cystocele 09/16/2012  . GERD (gastroesophageal reflux disease)   . Hyperlipidemia   . Hypertension   . Hypothyroid   . Neurogenic bladder 2013   husband caths pt, manages with timed void  . Osteoporosis   . Pulmonary embolism ([place])    1980s  . Renal cyst    Non-complex, contrast MRI 2013 with L>R non-complex cysts, dominant 3.7 left lower pole  . Seizures ([place])    1990 last seizures on meds Phenobarb  . Shingles late 15's  . Skin cancer    squamous cell  . Thyroid nodule    Right thyroid lobe, only seen on Sagittal imaging measures 2.3 cm in craniocaudal dimension and appears stable  . Tubular adenoma   . Vitamin D deficiency     Past Surgical History:  Procedure Laterality  Date  . ABDOMINAL HYSTERECTOMY    . ANTERIOR FUSION CLIVUS-C2 EXTRAORAL W/ ODONTOID EXCISION  8/14  . APPENDECTOMY    . BASAL CELL CARCINOMA EXCISION     face  . BLADDER SUSPENSION    . BREAST DUCTAL SYSTEM EXCISION Right 01/15/2014   Procedure: EXCISION DUCTAL SYSTEM RIGHT BREAST;  Surgeon: Stark Klein, MD;  Location: [facility];  Service: General;  Laterality: Right;  . BREAST EXCISIONAL BIOPSY Right   . CARPAL TUNNEL RELEASE     right  . CATARACT EXTRACTION     bilateral  . cervical neck ablation     x 7, C3-C6/3 screws and plate  . CESAREAN SECTION    . CYSTOCELE REPAIR    . DILATION AND CURETTAGE OF UTERUS    . duptyren's contracture right hand    . FINGER ARTHRODESIS Right 05/06/2015   Procedure: RIGHT RING PROXIMAL INTERPHALANGEAL FUSION (PIP);  Surgeon: Leanora Cover, MD;  Location: [facility];  Service: Orthopedics;  Laterality: Right;  . FINGER GANGLION CYST EXCISION     right  . HEMICOLECTOMY Right   . JOINT REPLACEMENT     right and left basal joints of thumbs  . left finger fusion  3 fingers on left hand/one finger right  . left ovary and tube removed    . NASAL POLYP SURGERY     4 sinus surgeries  . NASAL SEPTUM SURGERY    . PANNICULECTOMY    . PROXIMAL INTERPHALANGEAL FUSION (PIP) Left 09/09/2013   Procedure: FUSION LEFT INDEX PROXIMAL INTERPHALANGEAL JOINT (PIP);  Surgeon: Cammie Sickle., MD;  Location: [facility];  Service: Orthopedics;  Laterality: Left;  . right median nerve decompression    . SHOULDER ARTHROSCOPY     x2 left, 1 right  . sinus surgeries     x 4  . squamous lesions removed     neck and face  . STERIOD INJECTION Right 05/06/2015   Procedure: STEROID INJECTION;  Surgeon: Leanora Cover, MD;  Location: [facility];  Service: Orthopedics;  Laterality: Right;  Right Index Finger Proximal InterPhalangeal Joint Injection  . TONSILLECTOMY AND ADENOIDECTOMY    . TUBAL LIGATION    .  vocal polyps removed      There were no vitals filed for this visit.  Subjective Assessment - 09/22/19 1450    Subjective  Had a great weekend. No falls. States that the reminders on her rollator has been helpful to lock brakes.    Pertinent History  pt also takes carbamezepine ER 100 mg 4x/day but not found in meds. OA, asthma, COPD, HTN, neuropathy, ant cervical fusion, breast CA, arthroscopic left knee sg, 3 frozen shoulders, Right basal joint replacement Rt hand, left middle finger fusion, carpal tunnel syndrome, follows with neuro at [facility] with long h/o epilepsy.    Patient Stated Goals  Improve her balance and gait.    Currently in Pain?  No/denies                       [facility] Adult PT Treatment/Exercise - 09/22/19 0001      Transfers   Sit to Stand Details (indicate cue type and reason)  verbal cues for rollator brake management throughout session    Comments  At edge of elevated mat table: sit <> stands on foam pad - with BUE support to stand, with static standing with no UE support x10-15 seconds and eccentric lowering back to mat table with and without UE support, needed cues to scoot out towards edge      Neuro Re-ed    Neuro Re-ed Details   In corner: eyes closed with wider BOS and eyes closed 7 reps of 10-20 seconds each - with pt tendency to lose balance anteriorly, on rockerboard A/P direction with BUE support 2 x 10 reps - shifting weight onto toes and then backwards onto heels, on rockerboard M/L direction 1 x 10 reps weight shifting R and L with UE support and then holding board steady 4 x 15 second reps. In // bars: forward marching and backwards marching down and back 4 reps progressing from BUE to single UE support - cues to perform slowly and for incr foot clearance, on blue foam pad: A/P stagger stance weight shifting x10 reps B with cues for push off on posterior foot and toe raise on anterior foot, stepping over 4 smaller height obstacles progressing from BUE  support to single UE support x4 reps down and back.                PT Short Term Goals - 09/15/19 1323      PT SHORT TERM GOAL #1   Title  Pt  will be independent with initial HEP for ROM, strengthening and balance with assist of husband.    Baseline  Pt reports that she has been doing her exercises 2x/day as instructed    Time  4    Period  Weeks    Status  Achieved    Target Date  09/13/19      PT SHORT TERM GOAL #2   Title  Pt will ambulate >400' on level surfaces mod I with rollator for all improved mobility.    Baseline  09/15/19 460' with rollator mod I    Time  4    Period  Weeks    Status  Achieved    Target Date  09/13/19      PT SHORT TERM GOAL #3   Title  Pt will decrease 5 x sit to stand from 16.86 sec to <12 sec from mat with UE support for improved balance.    Baseline  29 sec on 5 x sit to stand from mat on 09/15/19    Time  4    Period  Weeks    Status  Not Met    Target Date  09/13/19      PT SHORT TERM GOAL #4   Title  Pt will perform transfers from varied surfaces mod I for improved safety.    Baseline  Pt still needs supervision for safety    Time  4    Period  Weeks    Status  Not Met    Target Date  09/13/19      PT SHORT TERM GOAL #5   Title  Pt will increase DF PROM by 10 degrees each ankle for improved mobility.    Baseline  right ankle=-25 and left=-8 degrees on 08/14/19 seated with knee bent. 4/19/21right ankle A/PROM=-14/-2,  left ankle DF=-18/-4 degrees    Time  4    Period  Weeks    Status  Partially Met    Target Date  09/13/19        PT Long Term Goals - 08/14/19 1347      PT LONG TERM GOAL #1   Title  Pt will be able to perform progressive HEP with husband to continue strength, balance and functional mobility gains on own.    Time  8    Period  Weeks    Status  New    Target Date  10/13/19      PT LONG TERM GOAL #2   Title  Pt will ambulate with rollator on varied surfaces >500' supervision for improved community  mobility.    Time  8    Period  Weeks    Status  New    Target Date  10/13/19      PT LONG TERM GOAL #3   Title  Pt will increase gait speed from 0.20ms to >0.791m for improved gait safety in community.    Baseline  08/14/19    Time  8    Period  Weeks    Status  New    Target Date  10/13/19      PT LONG TERM GOAL #4   Title  Pt will decrease TUG from 19.52 sec to <16 sec for improved balance and functional mobility.    Baseline  19.52 sec on 08/14/19    Time  8    Period  Weeks    Status  New    Target Date  10/13/19      PT LONG TERM  GOAL #5   Title  Pt will be able to maintain standing at counter >1 min without UE support for improved balance supervision.    Baseline  30 sec with increased sway and close SBA/CGA.    Time  8    Period  Weeks    Status  New    Target Date  10/13/19            Plan - 09/22/19 2048    Clinical Impression Statement  Focus of today's skilled session was balance strategies with decr UE support. Pt continues to have decr safety awareness with rollator and continues to need verbal and tactile cues to make sure rollator brakes are locked before/after transfers to incr safety and decrease fall risk. Pt with ankle weakness and had incr difficulty with rockerboard today for balance, especially in A/P direction. Will continue to progress towards LTGs.    Personal Factors and Comorbidities  Comorbidity 3+    Comorbidities  OA, COPD, HTN, neuropathy, h/o of epilepsy    Examination-Activity Limitations  Bathing;Stand;Locomotion Level;Transfers;Stairs    Examination-Participation Restrictions  Community Activity;Meal Prep    Stability/Clinical Decision Making  Evolving/Moderate complexity    Rehab Potential  Good    PT Frequency  2x / week    PT Duration  8 weeks    PT Treatment/Interventions  ADLs/Self Care Home Management;Aquatic Therapy;Cryotherapy;Moist Heat;DME Instruction;Gait training;Stair training;Functional mobility training;Therapeutic  activities;Therapeutic exercise;Orthotic Fit/Training;Patient/family education;Neuromuscular re-education;Balance training;Passive range of motion;Manual techniques    PT Next Visit Plan  Balance exercises in standing starting on level surface - with eyes closed and head motions, and then on compliant pillow with eyes open and static balance. Will need to focus mostly on compensating with vision and vestibular systems due to lack of sensation in feet. Transfer training.    Consulted and Agree with Plan of Care  Patient       Patient will benefit from skilled therapeutic intervention in order to improve the following deficits and impairments:  Abnormal gait, Decreased balance, Decreased knowledge of use of DME, Decreased range of motion, Decreased mobility, Decreased strength, Impaired sensation  Visit Diagnosis: Muscle weakness (generalized)  Other abnormalities of gait and mobility  Unsteadiness on feet  Repeated falls     Problem List Patient Active Problem List   Diagnosis Date Noted  . PCP NOTES >>>>>>>>>>>>>>>>>>> 09/16/2015  . Multiple thyroid nodules 03/30/2015  . Annual physical exam 08/07/2014  . Bloody discharge from nipple - s/p surgery, resolved  12/29/2013  . Breast mass, right 12/11/2013  . At high risk for falls 12/11/2013  . Renal cysts, acquired, bilateral--per urology 05/04/2013  . Elevated LFTs 04/01/2013  . Cervical spinal stenosis 11/21/2012  . Nondiabetic gastroparesis 08/20/2012  . Hypothyroidism 08/08/2012  . Gastroparesis 05/06/2012  . Muscle cramp 01/19/2012  . Fatigue 01/14/2012  . Neurogenic bladder, prolapse ,h/o urinary retention, self cath, sees urology 11/05/2011  . Fecal incontinence 10/03/2011  . Osteoporosis 07/24/2011  . Adenoma of cecum 07/17/2011  . Chronic constipation 07/17/2011  . Skin lesion 07/08/2011  . Chronic neck pain 04/16/2011  . Rectocele 04/16/2011  . Hypersomnia-- on dextroamphetamine 04/16/2011  . Vitamin D deficiency  04/16/2011  . Family history of colon cancer 04/16/2011  . Abnormal blood chemistry 04/16/2011  . Anemia   . COPD (chronic obstructive pulmonary disease) gold stage B   . Depression   . GERD (gastroesophageal reflux disease)   . Hyperlipidemia   . Hypertension   . Allergic rhinitis   . Pulmonary embolism--  in the 80s   . Skin cancer   . Seizures ([place])   . CIDP (chronic inflammatory demyelinating polyneuropathy) , Ataxia, Imbalance -- f/u at [facility], PT, DPT  09/22/2019, 8:58 PM  [facility] [address] [place], [place], [address] Phone: [phone]   Fax:  [phone]  Name: [name] MRN: [id number] Date of Birth: January 17, 1933

## 2019-09-23 ENCOUNTER — Other Ambulatory Visit: Payer: Self-pay | Admitting: Family Medicine

## 2019-09-23 DIAGNOSIS — G4710 Hypersomnia, unspecified: Secondary | ICD-10-CM

## 2019-09-23 NOTE — Telephone Encounter (Signed)
Last OV 07/30/19 Last fill 07/30/19  #120/0

## 2019-09-25 ENCOUNTER — Other Ambulatory Visit: Payer: Self-pay

## 2019-09-25 ENCOUNTER — Ambulatory Visit: Payer: Medicare Other

## 2019-09-25 DIAGNOSIS — R2689 Other abnormalities of gait and mobility: Secondary | ICD-10-CM

## 2019-09-25 DIAGNOSIS — M6281 Muscle weakness (generalized): Secondary | ICD-10-CM

## 2019-09-25 DIAGNOSIS — R2681 Unsteadiness on feet: Secondary | ICD-10-CM | POA: Diagnosis not present

## 2019-09-25 NOTE — Therapy (Signed)
[facility] [address] [place], [place], [address] Phone: [phone]   Fax:  [phone]  Physical Therapy Treatment  Patient Details  Name: [name] MRN: [id number] Date of Birth: Jan 11, 1933 Referring Provider (PT): Letta Median   Encounter Date: 09/25/2019  PT End of Session - 09/25/19 1108    Visit Number  9    Number of Visits  17    Date for PT Re-Evaluation  63/84/66   90 day cert but 60 day poc   Authorization Type  medicare so 10th visit progress note    PT Start Time  1104    PT Stop Time  1143    PT Time Calculation (min)  39 min    Equipment Utilized During Treatment  Gait belt    Activity Tolerance  Patient tolerated treatment well    Behavior During Therapy  WFL for tasks assessed/performed       Past Medical History:  Diagnosis Date  . Allergic rhinitis   . Anemia   . Arthritis   . Asthma -COPD   . Blood transfusion 1957  . Breast mass    right breast in milk duct, bx neg  . Chronic fatigue syndrome   . Chronic inflammatory demyelinating polyneuropathy ([facility]) 03/2011  . chronic sinusitis 08/08/2012  . Colon polyp   . Constipation   . COPD (chronic obstructive pulmonary disease) ([facility])   . Cystocele 09/16/2012  . GERD (gastroesophageal reflux disease)   . Hyperlipidemia   . Hypertension   . Hypothyroid   . Neurogenic bladder 2013   husband caths pt, manages with timed void  . Osteoporosis   . Pulmonary embolism ([facility])    1980s  . Renal cyst    Non-complex, contrast MRI 2013 with L>R non-complex cysts, dominant 3.7 left lower pole  . Seizures ([facility] [facility])    1990 last seizures on meds Phenobarb  . Shingles late 9's  . Skin cancer    squamous cell  . Thyroid nodule    Right thyroid lobe, only seen on Sagittal imaging measures 2.3 cm in craniocaudal dimension and appears stable  . Tubular adenoma   . Vitamin D deficiency     Past Surgical History:  Procedure Laterality  Date  . ABDOMINAL HYSTERECTOMY    . ANTERIOR FUSION CLIVUS-C2 EXTRAORAL W/ ODONTOID EXCISION  8/14  . APPENDECTOMY    . BASAL CELL CARCINOMA EXCISION     face  . BLADDER SUSPENSION    . BREAST DUCTAL SYSTEM EXCISION Right 01/15/2014   Procedure: EXCISION DUCTAL SYSTEM RIGHT BREAST;  Surgeon: Stark Klein, MD;  Location: [facility];  Service: General;  Laterality: Right;  . BREAST EXCISIONAL BIOPSY Right   . CARPAL TUNNEL RELEASE     right  . CATARACT EXTRACTION     bilateral  . cervical neck ablation     x 7, C3-C6/3 screws and plate  . CESAREAN SECTION    . CYSTOCELE REPAIR    . DILATION AND CURETTAGE OF UTERUS    . duptyren's contracture right hand    . FINGER ARTHRODESIS Right 05/06/2015   Procedure: RIGHT RING PROXIMAL INTERPHALANGEAL FUSION (PIP);  Surgeon: Leanora Cover, MD;  Location: [facility];  Service: Orthopedics;  Laterality: Right;  . FINGER GANGLION CYST EXCISION     right  . HEMICOLECTOMY Right   . JOINT REPLACEMENT     right and left basal joints of thumbs  . left finger fusion  3 fingers on left hand/one finger right  . left ovary and tube removed    . NASAL POLYP SURGERY     4 sinus surgeries  . NASAL SEPTUM SURGERY    . PANNICULECTOMY    . PROXIMAL INTERPHALANGEAL FUSION (PIP) Left 09/09/2013   Procedure: FUSION LEFT INDEX PROXIMAL INTERPHALANGEAL JOINT (PIP);  Surgeon: Cammie Sickle., MD;  Location: [facility];  Service: Orthopedics;  Laterality: Left;  . right median nerve decompression    . SHOULDER ARTHROSCOPY     x2 left, 1 right  . sinus surgeries     x 4  . squamous lesions removed     neck and face  . STERIOD INJECTION Right 05/06/2015   Procedure: STEROID INJECTION;  Surgeon: Leanora Cover, MD;  Location: [facility];  Service: Orthopedics;  Laterality: Right;  Right Index Finger Proximal InterPhalangeal Joint Injection  . TONSILLECTOMY AND ADENOIDECTOMY    . TUBAL LIGATION    .  vocal polyps removed      There were no vitals filed for this visit.  Subjective Assessment - 09/25/19 1108    Subjective  Pt reports that she is doing well. No changes. Has been doing her exercises.    Pertinent History  pt also takes carbamezepine ER 100 mg 4x/day but not found in meds. OA, asthma, COPD, HTN, neuropathy, ant cervical fusion, breast CA, arthroscopic left knee sg, 3 frozen shoulders, Right basal joint replacement Rt hand, left middle finger fusion, carpal tunnel syndrome, follows with neuro at [facility] with long h/o epilepsy.    Patient Stated Goals  Improve her balance and gait.    Currently in Pain?  No/denies                       [facility] Adult PT Treatment/Exercise - 09/25/19 1108      Transfers   Transfers  Sit to Stand;Stand to Sit    Sit to Stand  5: Supervision    Sit to Stand Details  Verbal cues for technique    Sit to Stand Details (indicate cue type and reason)  Pt was cued to push from chair versus pulling on rollator    Comments  Pt performed sit to stand from low mat x 5 with UE support with cues to not brace legs on mat and then x 5 without UE support getting balance each time      Ambulation/Gait   Ambulation/Gait  Yes    Ambulation/Gait Assistance  5: Supervision    Ambulation/Gait Assistance Details  Pt was given verbal cues to stay up in walker. Pt was able to keep hands on brakes as needed for safety.     Ambulation Distance (Feet)  900 Feet    Assistive device  Rollator    Gait Pattern  Step-through pattern;Decreased dorsiflexion - right;Decreased dorsiflexion - left    Ambulation Surface  Level;Unlevel;Indoor;Outdoor;Paved    Curb  5: Supervision    Curb Details (indicate cue type and reason)  with rollator with verbal cues for sequencing      Neuro Re-ed    Neuro Re-ed Details   In // bars: standing on rockerboard positioned ant/post with light UE support shifting ant/posterior to work on ankle ROM/strengthening, standing on  rockerboard trying to maintain level 30 sec x 3 CGA/occasional min assist. Repeated the prior with rockerboard positioned lateral. Pt was able to better control being able to use more hip strategy with lateral position. Standing  in staggered stance without UE support  30 sec x 2, standing with feet together 30 sec x 2, standing with feet apart with eyes closed x 30 sec. CGA for safety with all. Pt had increased sway with eyes closed.             PT Education - 09/25/19 1203    Education Details  Pt educated on compensatory training of visual and vestibular systems to help with balance due to neuropathy in legs. Explained that unlikely that would reverse but improving other sytems can help with balance as well as improving ankle ROM and leg strength.    Person(s) Educated  Patient    Methods  Explanation    Comprehension  Verbalized understanding       PT Short Term Goals - 09/15/19 1323      PT SHORT TERM GOAL #1   Title  Pt will be independent with initial HEP for ROM, strengthening and balance with assist of husband.    Baseline  Pt reports that she has been doing her exercises 2x/day as instructed    Time  4    Period  Weeks    Status  Achieved    Target Date  09/13/19      PT SHORT TERM GOAL #2   Title  Pt will ambulate >400' on level surfaces mod I with rollator for all improved mobility.    Baseline  09/15/19 460' with rollator mod I    Time  4    Period  Weeks    Status  Achieved    Target Date  09/13/19      PT SHORT TERM GOAL #3   Title  Pt will decrease 5 x sit to stand from 16.86 sec to <12 sec from mat with UE support for improved balance.    Baseline  29 sec on 5 x sit to stand from mat on 09/15/19    Time  4    Period  Weeks    Status  Not Met    Target Date  09/13/19      PT SHORT TERM GOAL #4   Title  Pt will perform transfers from varied surfaces mod I for improved safety.    Baseline  Pt still needs supervision for safety    Time  4    Period  Weeks     Status  Not Met    Target Date  09/13/19      PT SHORT TERM GOAL #5   Title  Pt will increase DF PROM by 10 degrees each ankle for improved mobility.    Baseline  right ankle=-25 and left=-8 degrees on 08/14/19 seated with knee bent. 4/19/21right ankle A/PROM=-14/-2,  left ankle DF=-18/-4 degrees    Time  4    Period  Weeks    Status  Partially Met    Target Date  09/13/19        PT Long Term Goals - 08/14/19 1347      PT LONG TERM GOAL #1   Title  Pt will be able to perform progressive HEP with husband to continue strength, balance and functional mobility gains on own.    Time  8    Period  Weeks    Status  New    Target Date  10/13/19      PT LONG TERM GOAL #2   Title  Pt will ambulate with rollator on varied surfaces >500' supervision for improved community mobility.  Time  8    Period  Weeks    Status  New    Target Date  10/13/19      PT LONG TERM GOAL #3   Title  Pt will increase gait speed from 0.45ms to >0.772m for improved gait safety in community.    Baseline  08/14/19    Time  8    Period  Weeks    Status  New    Target Date  10/13/19      PT LONG TERM GOAL #4   Title  Pt will decrease TUG from 19.52 sec to <16 sec for improved balance and functional mobility.    Baseline  19.52 sec on 08/14/19    Time  8    Period  Weeks    Status  New    Target Date  10/13/19      PT LONG TERM GOAL #5   Title  Pt will be able to maintain standing at counter >1 min without UE support for improved balance supervision.    Baseline  30 sec with increased sway and close SBA/CGA.    Time  8    Period  Weeks    Status  New    Target Date  10/13/19            Plan - 09/25/19 1205    Clinical Impression Statement  Pt able to progress gait outside with increased distance. No LOB with use of rollator. Pt does continue to need reminders for safety with transfers with rollator with hand placement. Pt able to maintain static stance with decreased support easier today  showing improvement in use of visual and vestibular systems.    Personal Factors and Comorbidities  Comorbidity 3+    Comorbidities  OA, COPD, HTN, neuropathy, h/o of epilepsy    Examination-Activity Limitations  Bathing;Stand;Locomotion Level;Transfers;Stairs    Examination-Participation Restrictions  Community Activity;Meal Prep    Stability/Clinical Decision Making  Evolving/Moderate complexity    Rehab Potential  Good    PT Frequency  2x / week    PT Duration  8 weeks    PT Treatment/Interventions  ADLs/Self Care Home Management;Aquatic Therapy;Cryotherapy;Moist Heat;DME Instruction;Gait training;Stair training;Functional mobility training;Therapeutic activities;Therapeutic exercise;Orthotic Fit/Training;Patient/family education;Neuromuscular re-education;Balance training;Passive range of motion;Manual techniques    PT Next Visit Plan  10th visit progress note next visit. Balance exercises in standing starting on level surface - with eyes closed and head motions, and then on compliant pillow with eyes open and static balance. Will need to focus mostly on compensating with vision and vestibular systems due to lack of sensation in feet. Transfer training.    Consulted and Agree with Plan of Care  Patient       Patient will benefit from skilled therapeutic intervention in order to improve the following deficits and impairments:  Abnormal gait, Decreased balance, Decreased knowledge of use of DME, Decreased range of motion, Decreased mobility, Decreased strength, Impaired sensation  Visit Diagnosis: Other abnormalities of gait and mobility  Muscle weakness (generalized)     Problem List Patient Active Problem List   Diagnosis Date Noted  . PCP NOTES >>>>>>>>>>>>>>>>>>> 09/16/2015  . Multiple thyroid nodules 03/30/2015  . Annual physical exam 08/07/2014  . Bloody discharge from nipple - s/p surgery, resolved  12/29/2013  . Breast mass, right 12/11/2013  . At high risk for falls  12/11/2013  . Renal cysts, acquired, bilateral--per urology 05/04/2013  . Elevated LFTs 04/01/2013  . Cervical spinal stenosis 11/21/2012  . Nondiabetic gastroparesis  08/20/2012  . Hypothyroidism 08/08/2012  . Gastroparesis 05/06/2012  . Muscle cramp 01/19/2012  . Fatigue 01/14/2012  . Neurogenic bladder, prolapse ,h/o urinary retention, self cath, sees urology 11/05/2011  . Fecal incontinence 10/03/2011  . Osteoporosis 07/24/2011  . Adenoma of cecum 07/17/2011  . Chronic constipation 07/17/2011  . Skin lesion 07/08/2011  . Chronic neck pain 04/16/2011  . Rectocele 04/16/2011  . Hypersomnia-- on dextroamphetamine 04/16/2011  . Vitamin D deficiency 04/16/2011  . Family history of colon cancer 04/16/2011  . Abnormal blood chemistry 04/16/2011  . Anemia   . COPD (chronic obstructive pulmonary disease) gold stage B   . Depression   . GERD (gastroesophageal reflux disease)   . Hyperlipidemia   . Hypertension   . Allergic rhinitis   . Pulmonary embolism-- in the 53s   . Skin cancer   . Seizures ([facility])   . CIDP (chronic inflammatory demyelinating polyneuropathy) , Ataxia, Imbalance -- f/u at [facility], PT, DPT, [facility] 09/25/2019, 12:07 PM  [facility] [address] [place], [place], [address] Phone: [phone]   Fax:  [phone]  Name: [name] MRN: [id number] Date of Birth: [birth year]

## 2019-09-29 ENCOUNTER — Other Ambulatory Visit: Payer: Self-pay

## 2019-09-29 ENCOUNTER — Ambulatory Visit: Payer: Medicare Other | Attending: Family Medicine | Admitting: Physical Therapy

## 2019-09-29 DIAGNOSIS — R2689 Other abnormalities of gait and mobility: Secondary | ICD-10-CM | POA: Diagnosis not present

## 2019-09-29 DIAGNOSIS — R296 Repeated falls: Secondary | ICD-10-CM | POA: Diagnosis not present

## 2019-09-29 DIAGNOSIS — R2681 Unsteadiness on feet: Secondary | ICD-10-CM | POA: Diagnosis not present

## 2019-09-29 DIAGNOSIS — M6281 Muscle weakness (generalized): Secondary | ICD-10-CM | POA: Diagnosis not present

## 2019-09-29 NOTE — Therapy (Signed)
[facility] [address] [place], [place], [address] Phone: [phone]   Fax:  [phone]  Physical Therapy Treatment/10th Visit Progress Note  Patient Details  Name: [name] MRN: [id number] Date of Birth: [birth year] Referring Provider (PT): Letta Median   10th Visit Physical Therapy Progress Note  Dates of Reporting Period: 08/14/19 to 09/29/19   Encounter Date: 09/29/2019  PT End of Session - 09/29/19 1416    Visit Number  10    Number of Visits  17    Date for PT Re-Evaluation  83/15/17   90 day cert but 60 day poc   Authorization Type  medicare so 10th visit progress note    PT Start Time  1316    PT Stop Time  1356    PT Time Calculation (min)  40 min    Equipment Utilized During Treatment  Gait belt    Activity Tolerance  Patient tolerated treatment well    Behavior During Therapy  [facility] for tasks assessed/performed       Past Medical History:  Diagnosis Date  . Allergic rhinitis   . Anemia   . Arthritis   . Asthma -COPD   . Blood transfusion 1957  . Breast mass    right breast in milk duct, bx neg  . Chronic fatigue syndrome   . Chronic inflammatory demyelinating polyneuropathy ([place]) 03/2011  . chronic sinusitis 08/08/2012  . Colon polyp   . Constipation   . COPD (chronic obstructive pulmonary disease) ([place])   . Cystocele 09/16/2012  . GERD (gastroesophageal reflux disease)   . Hyperlipidemia   . Hypertension   . Hypothyroid   . Neurogenic bladder 2013   husband caths pt, manages with timed void  . Osteoporosis   . Pulmonary embolism ([place])    1980s  . Renal cyst    Non-complex, contrast MRI 2013 with L>R non-complex cysts, dominant 3.7 left lower pole  . Seizures ([place])    1990 last seizures on meds Phenobarb  . Shingles late 75's  . Skin cancer    squamous cell  . Thyroid nodule    Right thyroid lobe, only seen on Sagittal imaging measures 2.3 cm in craniocaudal dimension  and appears stable  . Tubular adenoma   . Vitamin D deficiency     Past Surgical History:  Procedure Laterality Date  . ABDOMINAL HYSTERECTOMY    . ANTERIOR FUSION CLIVUS-C2 EXTRAORAL W/ ODONTOID EXCISION  8/14  . APPENDECTOMY    . BASAL CELL CARCINOMA EXCISION     face  . BLADDER SUSPENSION    . BREAST DUCTAL SYSTEM EXCISION Right 01/15/2014   Procedure: EXCISION DUCTAL SYSTEM RIGHT BREAST;  Surgeon: Stark Klein, MD;  Location: [place];  Service: General;  Laterality: Right;  . BREAST EXCISIONAL BIOPSY Right   . CARPAL TUNNEL RELEASE     right  . CATARACT EXTRACTION     bilateral  . cervical neck ablation     x 7, C3-C6/3 screws and plate  . CESAREAN SECTION    . CYSTOCELE REPAIR    . DILATION AND CURETTAGE OF UTERUS    . duptyren's contracture right hand    . FINGER ARTHRODESIS Right 05/06/2015   Procedure: RIGHT RING PROXIMAL INTERPHALANGEAL FUSION (PIP);  Surgeon: Leanora Cover, MD;  Location: [place];  Service: Orthopedics;  Laterality: Right;  . FINGER GANGLION CYST EXCISION     right  . HEMICOLECTOMY Right   . JOINT REPLACEMENT  right and left basal joints of thumbs  . left finger fusion     3 fingers on left hand/one finger right  . left ovary and tube removed    . NASAL POLYP SURGERY     4 sinus surgeries  . NASAL SEPTUM SURGERY    . PANNICULECTOMY    . PROXIMAL INTERPHALANGEAL FUSION (PIP) Left 09/09/2013   Procedure: FUSION LEFT INDEX PROXIMAL INTERPHALANGEAL JOINT (PIP);  Surgeon: Cammie Sickle., MD;  Location: [facility];  Service: Orthopedics;  Laterality: Left;  . right median nerve decompression    . SHOULDER ARTHROSCOPY     x2 left, 1 right  . sinus surgeries     x 4  . squamous lesions removed     neck and face  . STERIOD INJECTION Right 05/06/2015   Procedure: STEROID INJECTION;  Surgeon: Leanora Cover, MD;  Location: [place];  Service: Orthopedics;  Laterality: Right;  Right  Index Finger Proximal InterPhalangeal Joint Injection  . TONSILLECTOMY AND ADENOIDECTOMY    . TUBAL LIGATION    . vocal polyps removed      There were no vitals filed for this visit.  Subjective Assessment - 09/29/19 1317    Subjective  Doing well. No falls.    Pertinent History  pt also takes carbamezepine ER 100 mg 4x/day but not found in meds. OA, asthma, COPD, HTN, neuropathy, ant cervical fusion, breast CA, arthroscopic left knee sg, 3 frozen shoulders, Right basal joint replacement Rt hand, left middle finger fusion, carpal tunnel syndrome, follows with neuro at [facility] with long h/o epilepsy.    Patient Stated Goals  Improve her balance and gait.    Currently in Pain?  No/denies         [facility] PT Assessment - 09/29/19 0001      Timed Up and Go Test   Normal TUG (seconds)  16.12   with rollator                   [facility] Adult PT Treatment/Exercise - 09/29/19 0001      Ambulation/Gait   Gait velocity  17.47 seconds = .57 m/s      Knee/Hip Exercises: Standing   Lateral Step Up  Both;1 set;10 reps;Hand Hold: 2;Step Height: 4"    Lateral Step Up Limitations  verbal cues for technique    Forward Step Up  Both;1 set;10 reps;Hand Hold: 1;Step Height: 4"    Forward Step Up Limitations  verbal cues for technique    Other Standing Knee Exercises  alternating SLS toe taps to 4" step with no UE support, min guard for balance x10 reps B          Balance Exercises - 09/29/19 1422      Balance Exercises: Standing   Standing Eyes Closed  Wide (BOA);Solid surface;10 secs;Other reps (comment);Limitations    Standing Eyes Closed Limitations  5 reps of approx. 10 seconds    Rockerboard  Anterior/posterior;EO;Limitations    Rockerboard Limitations  A/P weight shifting onto toes and then back onto heels with BUE support, followed by trying to keep board still, 4 reps of approx. 10 seconds with min guard/min A at times for pt losing balance posteriorly    Other Standing Exercises   in corner with feet hip width: 2 x 10 reps gentle head turns, 2 x 10 reps gentle head nods, with min guard/min A for balance, esp with head nods    Other Standing Exercises Comments  In // bars: on red beam - with BUE support alternating heel taps to floor, cues to keep toes lifted x10 reps B, alternating posterior stepping strategy x10 reps B with cues for foot placement - pt needing BUE support, static standing on beam with no UE support for ankle strategy 6 reps of approx.. 10-15 seconds, with therapist providing min guard and a couple instances of min A due to pt losing balance posteriorly.         PT Education - 09/29/19 1417    Education Details  progress towards goals    Person(s) Educated  Patient    Methods  Explanation    Comprehension  Verbalized understanding       PT Short Term Goals - 09/15/19 1323      PT SHORT TERM GOAL #1   Title  Pt will be independent with initial HEP for ROM, strengthening and balance with assist of husband.    Baseline  Pt reports that she has been doing her exercises 2x/day as instructed    Time  4    Period  Weeks    Status  Achieved    Target Date  09/13/19      PT SHORT TERM GOAL #2   Title  Pt will ambulate >400' on level surfaces mod I with rollator for all improved mobility.    Baseline  09/15/19 460' with rollator mod I    Time  4    Period  Weeks    Status  Achieved    Target Date  09/13/19      PT SHORT TERM GOAL #3   Title  Pt will decrease 5 x sit to stand from 16.86 sec to <12 sec from mat with UE support for improved balance.    Baseline  29 sec on 5 x sit to stand from mat on 09/15/19    Time  4    Period  Weeks    Status  Not Met    Target Date  09/13/19      PT SHORT TERM GOAL #4   Title  Pt will perform transfers from varied surfaces mod I for improved safety.    Baseline  Pt still needs supervision for safety    Time  4    Period  Weeks    Status  Not Met    Target Date  09/13/19      PT SHORT TERM GOAL #5    Title  Pt will increase DF PROM by 10 degrees each ankle for improved mobility.    Baseline  right ankle=-25 and left=-8 degrees on 08/14/19 seated with knee bent. 4/19/21right ankle A/PROM=-14/-2,  left ankle DF=-18/-4 degrees    Time  4    Period  Weeks    Status  Partially Met    Target Date  09/13/19        PT Long Term Goals - 09/29/19 1323      PT LONG TERM GOAL #1   Title  Pt will be able to perform progressive HEP with husband to continue strength, balance and functional mobility gains on own.    Time  8    Period  Weeks    Status  New      PT LONG TERM GOAL #2   Title  Pt will ambulate with rollator on varied surfaces >500' supervision for improved community mobility.    Time  8    Period  Weeks    Status  New  PT LONG TERM GOAL #3   Title  Pt will increase gait speed from 0.32ms to >0.762m for improved gait safety in community.    Baseline  .57 m/s on 09/29/19    Time  8    Period  Weeks    Status  On-going      PT LONG TERM GOAL #4   Title  Pt will decrease TUG from 19.52 sec to <16 sec for improved balance and functional mobility.    Baseline  16.12 sec on 09/29/19    Time  8    Period  Weeks    Status  On-going      PT LONG TERM GOAL #5   Title  Pt will be able to maintain standing at counter >1 min without UE support for improved balance supervision.    Baseline  30 sec with increased sway and close SBA/CGA.    Time  8    Period  Weeks    Status  New            Plan - 09/29/19 1424    Clinical Impression Statement  10th visit progress note: Pt's gait speed today of .57 m/s with rollator is unchanged from initial eval - indicating that pt is a limited community ambulator and almost at a recurrent fall risk. Pt has improved TUG time with rollator to 16.12 seconds (previously was 19.52 seconds). Pt continues to need reminders for safety with transfers with rollator and brake management. Pt only able to hold balance with eyes closed and wide BOS on level  ground for approx. 10 seconds before losing balance posteriorly. Will continue to progress towards LTGs  to decr pt's fall risk and improve functional mobility.    Personal Factors and Comorbidities  Comorbidity 3+    Comorbidities  OA, COPD, HTN, neuropathy, h/o of epilepsy    Examination-Activity Limitations  Bathing;Stand;Locomotion Level;Transfers;Stairs    Examination-Participation Restrictions  Community Activity;Meal Prep    Stability/Clinical Decision Making  Evolving/Moderate complexity    Rehab Potential  Good    PT Frequency  2x / week    PT Duration  8 weeks    PT Treatment/Interventions  ADLs/Self Care Home Management;Aquatic Therapy;Cryotherapy;Moist Heat;DME Instruction;Gait training;Stair training;Functional mobility training;Therapeutic activities;Therapeutic exercise;Orthotic Fit/Training;Patient/family education;Neuromuscular re-education;Balance training;Passive range of motion;Manual techniques    PT Next Visit Plan  Balance exercises in standing starting on level surface - with eyes closed and head motions, and then on compliant pillow with eyes open and static balance. Will need to focus mostly on compensating with vision and vestibular systems due to lack of sensation in feet. Transfer training. Functional BLE strengthening    Consulted and Agree with Plan of Care  Patient       Patient will benefit from skilled therapeutic intervention in order to improve the following deficits and impairments:  Abnormal gait, Decreased balance, Decreased knowledge of use of DME, Decreased range of motion, Decreased mobility, Decreased strength, Impaired sensation  Visit Diagnosis: Other abnormalities of gait and mobility  Muscle weakness (generalized)  Unsteadiness on feet  Repeated falls     Problem List Patient Active Problem List   Diagnosis Date Noted  . PCP NOTES >>>>>>>>>>>>>>>>>>> 09/16/2015  . Multiple thyroid nodules 03/30/2015  . Annual physical exam 08/07/2014   . Bloody discharge from nipple - s/p surgery, resolved  12/29/2013  . Breast mass, right 12/11/2013  . At high risk for falls 12/11/2013  . Renal cysts, acquired, bilateral--per urology 05/04/2013  . Elevated LFTs 04/01/2013  .  Cervical spinal stenosis 11/21/2012  . Nondiabetic gastroparesis 08/20/2012  . Hypothyroidism 08/08/2012  . Gastroparesis 05/06/2012  . Muscle cramp 01/19/2012  . Fatigue 01/14/2012  . Neurogenic bladder, prolapse ,h/o urinary retention, self cath, sees urology 11/05/2011  . Fecal incontinence 10/03/2011  . Osteoporosis 07/24/2011  . Adenoma of cecum 07/17/2011  . Chronic constipation 07/17/2011  . Skin lesion 07/08/2011  . Chronic neck pain 04/16/2011  . Rectocele 04/16/2011  . Hypersomnia-- on dextroamphetamine 04/16/2011  . Vitamin D deficiency 04/16/2011  . Family history of colon cancer 04/16/2011  . Abnormal blood chemistry 04/16/2011  . Anemia   . COPD (chronic obstructive pulmonary disease) gold stage B   . Depression   . GERD (gastroesophageal reflux disease)   . Hyperlipidemia   . Hypertension   . Allergic rhinitis   . Pulmonary embolism-- in the 29s   . Skin cancer   . Seizures ([place])   . CIDP (chronic inflammatory demyelinating polyneuropathy) , Ataxia, Imbalance -- f/u at [facility], PT, DPT  09/29/2019, 2:26 PM  [facility] [address] [place], [place], [address] Phone: [phone]   Fax:  [phone]  Name: [name] MRN: [id number] Date of Birth: [birth year]

## 2019-10-02 ENCOUNTER — Ambulatory Visit: Payer: Medicare Other

## 2019-10-02 ENCOUNTER — Other Ambulatory Visit: Payer: Self-pay

## 2019-10-02 DIAGNOSIS — R2689 Other abnormalities of gait and mobility: Secondary | ICD-10-CM

## 2019-10-02 DIAGNOSIS — M6281 Muscle weakness (generalized): Secondary | ICD-10-CM

## 2019-10-02 NOTE — Therapy (Signed)
[facility] [address] [place], [place], [address] Phone: [phone]   Fax:  [phone]  Physical Therapy Treatment  Patient Details  Name: [name] MRN: [id number] Date of Birth: [birth year] Referring Provider (PT): Letta Median   Encounter Date: 10/02/2019  PT End of Session - 10/02/19 1103    Visit Number  11    Number of Visits  17    Date for PT Re-Evaluation  92/01/00   90 day cert but 60 day poc   Authorization Type  medicare so 10th visit progress note    PT Start Time  1101    PT Stop Time  1140    PT Time Calculation (min)  39 min    Equipment Utilized During Treatment  Gait belt    Activity Tolerance  Patient tolerated treatment well    Behavior During Therapy  WFL for tasks assessed/performed       Past Medical History:  Diagnosis Date  . Allergic rhinitis   . Anemia   . Arthritis   . Asthma -COPD   . Blood transfusion 1957  . Breast mass    right breast in milk duct, bx neg  . Chronic fatigue syndrome   . Chronic inflammatory demyelinating polyneuropathy ([facility]) 03/2011  . chronic sinusitis 08/08/2012  . Colon polyp   . Constipation   . COPD (chronic obstructive pulmonary disease) ([facility])   . Cystocele 09/16/2012  . GERD (gastroesophageal reflux disease)   . Hyperlipidemia   . Hypertension   . Hypothyroid   . Neurogenic bladder 2013   husband caths pt, manages with timed void  . Osteoporosis   . Pulmonary embolism ([facility] [facility])    1980s  . Renal cyst    Non-complex, contrast MRI 2013 with L>R non-complex cysts, dominant 3.7 left lower pole  . Seizures ([place])    1990 last seizures on meds Phenobarb  . Shingles late 60's  . Skin cancer    squamous cell  . Thyroid nodule    Right thyroid lobe, only seen on Sagittal imaging measures 2.3 cm in craniocaudal dimension and appears stable  . Tubular adenoma   . Vitamin D deficiency     Past Surgical History:  Procedure Laterality  Date  . ABDOMINAL HYSTERECTOMY    . ANTERIOR FUSION CLIVUS-C2 EXTRAORAL W/ ODONTOID EXCISION  8/14  . APPENDECTOMY    . BASAL CELL CARCINOMA EXCISION     face  . BLADDER SUSPENSION    . BREAST DUCTAL SYSTEM EXCISION Right 01/15/2014   Procedure: EXCISION DUCTAL SYSTEM RIGHT BREAST;  Surgeon: Stark Klein, MD;  Location: [facility];  Service: General;  Laterality: Right;  . BREAST EXCISIONAL BIOPSY Right   . CARPAL TUNNEL RELEASE     right  . CATARACT EXTRACTION     bilateral  . cervical neck ablation     x 7, C3-C6/3 screws and plate  . CESAREAN SECTION    . CYSTOCELE REPAIR    . DILATION AND CURETTAGE OF UTERUS    . duptyren's contracture right hand    . FINGER ARTHRODESIS Right 05/06/2015   Procedure: RIGHT RING PROXIMAL INTERPHALANGEAL FUSION (PIP);  Surgeon: Leanora Cover, MD;  Location: [facility];  Service: Orthopedics;  Laterality: Right;  . FINGER GANGLION CYST EXCISION     right  . HEMICOLECTOMY Right   . JOINT REPLACEMENT     right and left basal joints of thumbs  . left finger fusion  3 fingers on left hand/one finger right  . left ovary and tube removed    . NASAL POLYP SURGERY     4 sinus surgeries  . NASAL SEPTUM SURGERY    . PANNICULECTOMY    . PROXIMAL INTERPHALANGEAL FUSION (PIP) Left 09/09/2013   Procedure: FUSION LEFT INDEX PROXIMAL INTERPHALANGEAL JOINT (PIP);  Surgeon: Cammie Sickle., MD;  Location: [facility];  Service: Orthopedics;  Laterality: Left;  . right median nerve decompression    . SHOULDER ARTHROSCOPY     x2 left, 1 right  . sinus surgeries     x 4  . squamous lesions removed     neck and face  . STERIOD INJECTION Right 05/06/2015   Procedure: STEROID INJECTION;  Surgeon: Leanora Cover, MD;  Location: [facility];  Service: Orthopedics;  Laterality: Right;  Right Index Finger Proximal InterPhalangeal Joint Injection  . TONSILLECTOMY AND ADENOIDECTOMY    . TUBAL LIGATION    .  vocal polyps removed      There were no vitals filed for this visit.  Subjective Assessment - 10/02/19 1104    Subjective  Pt reports that she is doing well. No falls.    Pertinent History  pt also takes carbamezepine ER 100 mg 4x/day but not found in meds. OA, asthma, COPD, HTN, neuropathy, ant cervical fusion, breast CA, arthroscopic left knee sg, 3 frozen shoulders, Right basal joint replacement Rt hand, left middle finger fusion, carpal tunnel syndrome, follows with neuro at [facility] with long h/o epilepsy.    Patient Stated Goals  Improve her balance and gait.    Currently in Pain?  No/denies                       [facility] Adult PT Treatment/Exercise - 10/02/19 1104      Transfers   Transfers  Sit to Stand;Stand to Sit    Sit to Stand  5: Supervision    Sit to Stand Details  Verbal cues for technique    Stand to Sit  5: Supervision    Comments  Pt performed sit to stand 5 x 2 from edge of mat without hands working on control and getting balance each time. Cued to not brace legs on mat      Ambulation/Gait   Ambulation/Gait  Yes    Ambulation/Gait Assistance  5: Supervision    Ambulation/Gait Assistance Details  Pt performed dynamic gait activities 115' each during gait: head turns left/right and up/down on command, speed changes on command.    Ambulation Distance (Feet)  475 Feet    Assistive device  Rollator    Gait Pattern  Step-through pattern;Decreased dorsiflexion - right;Decreased dorsiflexion - left    Ambulation Surface  Level;Indoor      Neuro Re-ed    Neuro Re-ed Details   In // bars: rockerboard maintaining level 30 sec x 2 without UE support CGA then rocking ant/post x 10 with 10 sec hold posterior to stretch calves with UE support, standing on red plank 30 sec x 2 without UE support CGA, standing on floor staggered stance x 30 sec each position CGA. Standing with feet apart on floor 30 sec x 2 eyes closed CGA. Pt needed occasional min assist if shifted too  far especially with posterior due to poor ankle reaction but was able to incorporate more hip strategy. At counter: alternating toe taps on cone with 1 UE support x 10 and then with 1  HHA support, bilateral support tapping cone to the side x 10. Gait backwards and then marching forwards with 1 UE support CGA x 3 laps. Pt was cued to keep feet apart some with backwards steps as very narrow BOS.             PT Education - 10/02/19 1228    Education Details  Pt to continue with current HEP    Person(s) Educated  Patient    Methods  Explanation    Comprehension  Verbalized understanding       PT Short Term Goals - 09/15/19 1323      PT SHORT TERM GOAL #1   Title  Pt will be independent with initial HEP for ROM, strengthening and balance with assist of husband.    Baseline  Pt reports that she has been doing her exercises 2x/day as instructed    Time  4    Period  Weeks    Status  Achieved    Target Date  09/13/19      PT SHORT TERM GOAL #2   Title  Pt will ambulate >400' on level surfaces mod I with rollator for all improved mobility.    Baseline  09/15/19 460' with rollator mod I    Time  4    Period  Weeks    Status  Achieved    Target Date  09/13/19      PT SHORT TERM GOAL #3   Title  Pt will decrease 5 x sit to stand from 16.86 sec to <12 sec from mat with UE support for improved balance.    Baseline  29 sec on 5 x sit to stand from mat on 09/15/19    Time  4    Period  Weeks    Status  Not Met    Target Date  09/13/19      PT SHORT TERM GOAL #4   Title  Pt will perform transfers from varied surfaces mod I for improved safety.    Baseline  Pt still needs supervision for safety    Time  4    Period  Weeks    Status  Not Met    Target Date  09/13/19      PT SHORT TERM GOAL #5   Title  Pt will increase DF PROM by 10 degrees each ankle for improved mobility.    Baseline  right ankle=-25 and left=-8 degrees on 08/14/19 seated with knee bent. 4/19/21right ankle  A/PROM=-14/-2,  left ankle DF=-18/-4 degrees    Time  4    Period  Weeks    Status  Partially Met    Target Date  09/13/19        PT Long Term Goals - 09/29/19 1323      PT LONG TERM GOAL #1   Title  Pt will be able to perform progressive HEP with husband to continue strength, balance and functional mobility gains on own.    Time  8    Period  Weeks    Status  New      PT LONG TERM GOAL #2   Title  Pt will ambulate with rollator on varied surfaces >500' supervision for improved community mobility.    Time  8    Period  Weeks    Status  New      PT LONG TERM GOAL #3   Title  Pt will increase gait speed from 0.31ms to >0.736m for improved gait safety in community.  Baseline  .57 m/s on 09/29/19    Time  8    Period  Weeks    Status  On-going      PT LONG TERM GOAL #4   Title  Pt will decrease TUG from 19.52 sec to <16 sec for improved balance and functional mobility.    Baseline  16.12 sec on 09/29/19    Time  8    Period  Weeks    Status  On-going      PT LONG TERM GOAL #5   Title  Pt will be able to maintain standing at counter >1 min without UE support for improved balance supervision.    Baseline  30 sec with increased sway and close SBA/CGA.    Time  8    Period  Weeks    Status  New            Plan - 10/02/19 1231    Clinical Impression Statement  PT continued to focus on compensatory balance strategies focusing more on vision and vestibular due to somatosensory impairment. Also worked more on hip strategy due to weakness in ankles from neuropathy. Pt continues to show improved control. She is performing sit to stand better. Needs continued reminders for safety with rollator.    Personal Factors and Comorbidities  Comorbidity 3+    Comorbidities  OA, COPD, HTN, neuropathy, h/o of epilepsy    Examination-Activity Limitations  Bathing;Stand;Locomotion Level;Transfers;Stairs    Examination-Participation Restrictions  Community Activity;Meal Prep     Stability/Clinical Decision Making  Evolving/Moderate complexity    Rehab Potential  Good    PT Frequency  2x / week    PT Duration  8 weeks    PT Treatment/Interventions  ADLs/Self Care Home Management;Aquatic Therapy;Cryotherapy;Moist Heat;DME Instruction;Gait training;Stair training;Functional mobility training;Therapeutic activities;Therapeutic exercise;Orthotic Fit/Training;Patient/family education;Neuromuscular re-education;Balance training;Passive range of motion;Manual techniques    PT Next Visit Plan  Next week is week 8 so start checking LTGs for possible d/c versus recert. Balance exercises in standing starting on level surface - with eyes closed and head motions, and then on compliant pillow with eyes open and static balance. Will need to focus mostly on compensating with vision and vestibular systems due to lack of sensation in feet. Transfer training. Functional BLE strengthening    Consulted and Agree with Plan of Care  Patient       Patient will benefit from skilled therapeutic intervention in order to improve the following deficits and impairments:  Abnormal gait, Decreased balance, Decreased knowledge of use of DME, Decreased range of motion, Decreased mobility, Decreased strength, Impaired sensation  Visit Diagnosis: Other abnormalities of gait and mobility  Muscle weakness (generalized)     Problem List Patient Active Problem List   Diagnosis Date Noted  . PCP NOTES >>>>>>>>>>>>>>>>>>> 09/16/2015  . Multiple thyroid nodules 03/30/2015  . Annual physical exam 08/07/2014  . Bloody discharge from nipple - s/p surgery, resolved  12/29/2013  . Breast mass, right 12/11/2013  . At high risk for falls 12/11/2013  . Renal cysts, acquired, bilateral--per urology 05/04/2013  . Elevated LFTs 04/01/2013  . Cervical spinal stenosis 11/21/2012  . Nondiabetic gastroparesis 08/20/2012  . Hypothyroidism 08/08/2012  . Gastroparesis 05/06/2012  . Muscle cramp 01/19/2012  . Fatigue  01/14/2012  . Neurogenic bladder, prolapse ,h/o urinary retention, self cath, sees urology 11/05/2011  . Fecal incontinence 10/03/2011  . Osteoporosis 07/24/2011  . Adenoma of cecum 07/17/2011  . Chronic constipation 07/17/2011  . Skin lesion 07/08/2011  . Chronic neck  pain 04/16/2011  . Rectocele 04/16/2011  . Hypersomnia-- on dextroamphetamine 04/16/2011  . Vitamin D deficiency 04/16/2011  . Family history of colon cancer 04/16/2011  . Abnormal blood chemistry 04/16/2011  . Anemia   . COPD (chronic obstructive pulmonary disease) gold stage B   . Depression   . GERD (gastroesophageal reflux disease)   . Hyperlipidemia   . Hypertension   . Allergic rhinitis   . Pulmonary embolism-- in the 36s   . Skin cancer   . Seizures ([facility])   . CIDP (chronic inflammatory demyelinating polyneuropathy) , Ataxia, Imbalance -- f/u at [facility], PT, DPT, NCS 10/02/2019, 12:34 PM  [facility] [address] [place], [place], [address] Phone: [phone]   Fax:  [phone]  Name: [name] MRN: [id number] Date of Birth: September 02, 1932

## 2019-10-06 ENCOUNTER — Other Ambulatory Visit: Payer: Self-pay

## 2019-10-06 ENCOUNTER — Ambulatory Visit: Payer: Medicare Other

## 2019-10-06 DIAGNOSIS — M6281 Muscle weakness (generalized): Secondary | ICD-10-CM

## 2019-10-06 DIAGNOSIS — R2689 Other abnormalities of gait and mobility: Secondary | ICD-10-CM | POA: Diagnosis not present

## 2019-10-06 NOTE — Therapy (Signed)
[facility] [address] [place], [place], [address] Phone: [phone]   Fax:  [phone]  Physical Therapy Treatment  Patient Details  Name: [name] MRN: [id number] Date of Birth: 25-Jul-1932 Referring Provider (PT): Letta Median   Encounter Date: 10/06/2019  PT End of Session - 10/06/19 1108    Visit Number  12    Number of Visits  17    Date for PT Re-Evaluation  03/00/92   90 day cert but 60 day poc   Authorization Type  medicare so 10th visit progress note    PT Start Time  1105    PT Stop Time  1148    PT Time Calculation (min)  43 min    Equipment Utilized During Treatment  Gait belt    Activity Tolerance  Patient tolerated treatment well    Behavior During Therapy  WFL for tasks assessed/performed       Past Medical History:  Diagnosis Date  . Allergic rhinitis   . Anemia   . Arthritis   . Asthma -COPD   . Blood transfusion 1957  . Breast mass    right breast in milk duct, bx neg  . Chronic fatigue syndrome   . Chronic inflammatory demyelinating polyneuropathy ([place]) 03/2011  . chronic sinusitis 08/08/2012  . Colon polyp   . Constipation   . COPD (chronic obstructive pulmonary disease) ([place])   . Cystocele 09/16/2012  . GERD (gastroesophageal reflux disease)   . Hyperlipidemia   . Hypertension   . Hypothyroid   . Neurogenic bladder 2013   husband caths pt, manages with timed void  . Osteoporosis   . Pulmonary embolism ([place])    1980s  . Renal cyst    Non-complex, contrast MRI 2013 with L>R non-complex cysts, dominant 3.7 left lower pole  . Seizures ([place])    1990 last seizures on meds Phenobarb  . Shingles late 100's  . Skin cancer    squamous cell  . Thyroid nodule    Right thyroid lobe, only seen on Sagittal imaging measures 2.3 cm in craniocaudal dimension and appears stable  . Tubular adenoma   . Vitamin D deficiency     Past Surgical History:  Procedure Laterality  Date  . ABDOMINAL HYSTERECTOMY    . ANTERIOR FUSION CLIVUS-C2 EXTRAORAL W/ ODONTOID EXCISION  8/14  . APPENDECTOMY    . BASAL CELL CARCINOMA EXCISION     face  . BLADDER SUSPENSION    . BREAST DUCTAL SYSTEM EXCISION Right 01/15/2014   Procedure: EXCISION DUCTAL SYSTEM RIGHT BREAST;  Surgeon: Stark Klein, MD;  Location: [facility];  Service: General;  Laterality: Right;  . BREAST EXCISIONAL BIOPSY Right   . CARPAL TUNNEL RELEASE     right  . CATARACT EXTRACTION     bilateral  . cervical neck ablation     x 7, C3-C6/3 screws and plate  . CESAREAN SECTION    . CYSTOCELE REPAIR    . DILATION AND CURETTAGE OF UTERUS    . duptyren's contracture right hand    . FINGER ARTHRODESIS Right 05/06/2015   Procedure: RIGHT RING PROXIMAL INTERPHALANGEAL FUSION (PIP);  Surgeon: Leanora Cover, MD;  Location: [facility];  Service: Orthopedics;  Laterality: Right;  . FINGER GANGLION CYST EXCISION     right  . HEMICOLECTOMY Right   . JOINT REPLACEMENT     right and left basal joints of thumbs  . left finger fusion  3 fingers on left hand/one finger right  . left ovary and tube removed    . NASAL POLYP SURGERY     4 sinus surgeries  . NASAL SEPTUM SURGERY    . PANNICULECTOMY    . PROXIMAL INTERPHALANGEAL FUSION (PIP) Left 09/09/2013   Procedure: FUSION LEFT INDEX PROXIMAL INTERPHALANGEAL JOINT (PIP);  Surgeon: Cammie Sickle., MD;  Location: [facility];  Service: Orthopedics;  Laterality: Left;  . right median nerve decompression    . SHOULDER ARTHROSCOPY     x2 left, 1 right  . sinus surgeries     x 4  . squamous lesions removed     neck and face  . STERIOD INJECTION Right 05/06/2015   Procedure: STEROID INJECTION;  Surgeon: Leanora Cover, MD;  Location: [facility];  Service: Orthopedics;  Laterality: Right;  Right Index Finger Proximal InterPhalangeal Joint Injection  . TONSILLECTOMY AND ADENOIDECTOMY    . TUBAL LIGATION    .  vocal polyps removed      There were no vitals filed for this visit.  Subjective Assessment - 10/06/19 1108    Subjective  Pt reports that she is doing well.    Pertinent History  pt also takes carbamezepine ER 100 mg 4x/day but not found in meds. OA, asthma, COPD, HTN, neuropathy, ant cervical fusion, breast CA, arthroscopic left knee sg, 3 frozen shoulders, Right basal joint replacement Rt hand, left middle finger fusion, carpal tunnel syndrome, follows with neuro at [facility] with long h/o epilepsy.    Patient Stated Goals  Improve her balance and gait.    Currently in Pain?  No/denies                       [facility] Adult PT Treatment/Exercise - 10/06/19 1113      Transfers   Transfers  Sit to Stand;Stand to Sit    Sit to Stand  5: Supervision    Sit to Stand Details  Verbal cues for technique    Five time sit to stand comments   20 sec from mat with light UE support    Stand to Sit  5: Supervision    Stand to Sit Details (indicate cue type and reason)  Verbal cues for technique    Stand to Sit Details  Pt was cued to lock rollator prior to sitting and reach back    Comments  Pt performed sit to stand 5 x 2.       Ambulation/Gait   Ambulation/Gait  Yes    Ambulation/Gait Assistance  5: Supervision;4: Min guard    Ambulation/Gait Assistance Details  Pt was cued to increase foot clearance on grass. Did have some trouble pushing rollator through thick grass. CGA on grass for safety.    Ambulation Distance (Feet)  850 Feet    Assistive device  Rollator    Gait Pattern  Step-through pattern;Decreased dorsiflexion - right;Decreased dorsiflexion - left    Ambulation Surface  Level;Unlevel;Outdoor;Paved;Grass    Curb  5: Supervision    Curb Details (indicate cue type and reason)  up/down curb with rollator. Pt was able to remember the sequence from last visit and lift front wheels of rollator up/down well.      Neuro Re-ed    Neuro Re-ed Details   Pt stood in front of mat x 1  min 5 sec with leaning back and touching legs on mat at 45 sec mark briefly. Performed supervision. Standing at stairs  with feet together x 30 sec then feet apart x 30 sec eyes closed with increased sway CGA, standing on airex with feet apart x 30 sec. Pt leaned posterior a couple times needing min assist to regain.  Standing in corner with rollator in front: feet apart without UE support x 30 sec, feet apart with head turns up/down and left/right x 10 each supervision.             PT Education - 10/06/19 1211    Education Details  Added corner balance exercises to HEP    Person(s) Educated  Patient    Methods  Explanation;Demonstration;Handout    Comprehension  Verbalized understanding;Returned demonstration       PT Short Term Goals - 10/06/19 1110      PT SHORT TERM GOAL #1   Title  Pt will be independent with initial HEP for ROM, strengthening and balance with assist of husband.    Baseline  Pt reports that she has been doing her exercises 2x/day as instructed    Time  4    Period  Weeks    Status  Achieved    Target Date  09/13/19      PT SHORT TERM GOAL #2   Title  Pt will ambulate >400' on level surfaces mod I with rollator for all improved mobility.    Baseline  09/15/19 460' with rollator mod I    Time  4    Period  Weeks    Status  Achieved    Target Date  09/13/19      PT SHORT TERM GOAL #3   Title  Pt will decrease 5 x sit to stand from 16.86 sec to <12 sec from mat with UE support for improved balance.    Baseline  29 sec on 5 x sit to stand from mat on 09/15/19, 20 sec on 10/06/19 with light UE support    Time  4    Period  Weeks    Status  Not Met    Target Date  09/13/19      PT SHORT TERM GOAL #4   Title  Pt will perform transfers from varied surfaces mod I for improved safety.    Baseline  Pt still needs supervision for safety    Time  4    Period  Weeks    Status  Not Met    Target Date  09/13/19      PT SHORT TERM GOAL #5   Title  Pt will increase  DF PROM by 10 degrees each ankle for improved mobility.    Baseline  right ankle=-25 and left=-8 degrees on 08/14/19 seated with knee bent. 4/19/21right ankle A/PROM=-14/-2,  left ankle DF=-18/-4 degrees    Time  4    Period  Weeks    Status  Partially Met    Target Date  09/13/19        PT Long Term Goals - 10/06/19 1212      PT LONG TERM GOAL #1   Title  Pt will be able to perform progressive HEP with husband to continue strength, balance and functional mobility gains on own.    Time  8    Period  Weeks    Status  New      PT LONG TERM GOAL #2   Title  Pt will ambulate with rollator on varied surfaces >500' supervision for improved community mobility.    Time  8    Period  Weeks  Status  New      PT LONG TERM GOAL #3   Title  Pt will increase gait speed from 0.22ms to >0.746m for improved gait safety in community.    Baseline  .57 m/s on 09/29/19    Time  8    Period  Weeks    Status  On-going      PT LONG TERM GOAL #4   Title  Pt will decrease TUG from 19.52 sec to <16 sec for improved balance and functional mobility.    Baseline  16.12 sec on 09/29/19    Time  8    Period  Weeks    Status  On-going      PT LONG TERM GOAL #5   Title  Pt will be able to maintain standing at counter >1 min without UE support for improved balance supervision.    Baseline  45 sec before touching back of mat with legs supervision then completed 1 min    Time  8    Period  Weeks    Status  Partially Met            Plan - 10/06/19 1214    Clinical Impression Statement  Pt was able to show improvement in static standing just missing 1 min stand goal as she leaned posterior and touched mat with legs to steady herself after 45 sec. She is showing improving stability with gait on varied surfaces with increased distance outside today. Pt continues to benefit from skilled PT to work more on balance and gait safety.    Personal Factors and Comorbidities  Comorbidity 3+    Comorbidities  OA,  COPD, HTN, neuropathy, h/o of epilepsy    Examination-Activity Limitations  Bathing;Stand;Locomotion Level;Transfers;Stairs    Examination-Participation Restrictions  Community Activity;Meal Prep    Stability/Clinical Decision Making  Evolving/Moderate complexity    Rehab Potential  Good    PT Frequency  2x / week    PT Duration  8 weeks    PT Treatment/Interventions  ADLs/Self Care Home Management;Aquatic Therapy;Cryotherapy;Moist Heat;DME Instruction;Gait training;Stair training;Functional mobility training;Therapeutic activities;Therapeutic exercise;Orthotic Fit/Training;Patient/family education;Neuromuscular re-education;Balance training;Passive range of motion;Manual techniques    PT Next Visit Plan  Check remaining LTGs for possible recert versus d/c next visit. Balance exercises in standing starting on level surface - with eyes closed and head motions, and then on compliant pillow with eyes open and static balance. Will need to focus mostly on compensating with vision and vestibular systems due to lack of sensation in feet. Transfer training. Functional BLE strengthening    Consulted and Agree with Plan of Care  Patient       Patient will benefit from skilled therapeutic intervention in order to improve the following deficits and impairments:  Abnormal gait, Decreased balance, Decreased knowledge of use of DME, Decreased range of motion, Decreased mobility, Decreased strength, Impaired sensation  Visit Diagnosis: Other abnormalities of gait and mobility  Muscle weakness (generalized)     Problem List Patient Active Problem List   Diagnosis Date Noted  . PCP NOTES >>>>>>>>>>>>>>>>>>> 09/16/2015  . Multiple thyroid nodules 03/30/2015  . Annual physical exam 08/07/2014  . Bloody discharge from nipple - s/p surgery, resolved  12/29/2013  . Breast mass, right 12/11/2013  . At high risk for falls 12/11/2013  . Renal cysts, acquired, bilateral--per urology 05/04/2013  . Elevated LFTs  04/01/2013  . Cervical spinal stenosis 11/21/2012  . Nondiabetic gastroparesis 08/20/2012  . Hypothyroidism 08/08/2012  . Gastroparesis 05/06/2012  . Muscle cramp  01/19/2012  . Fatigue 01/14/2012  . Neurogenic bladder, prolapse ,h/o urinary retention, self cath, sees urology 11/05/2011  . Fecal incontinence 10/03/2011  . Osteoporosis 07/24/2011  . Adenoma of cecum 07/17/2011  . Chronic constipation 07/17/2011  . Skin lesion 07/08/2011  . Chronic neck pain 04/16/2011  . Rectocele 04/16/2011  . Hypersomnia-- on dextroamphetamine 04/16/2011  . Vitamin D deficiency 04/16/2011  . Family history of colon cancer 04/16/2011  . Abnormal blood chemistry 04/16/2011  . Anemia   . COPD (chronic obstructive pulmonary disease) gold stage B   . Depression   . GERD (gastroesophageal reflux disease)   . Hyperlipidemia   . Hypertension   . Allergic rhinitis   . Pulmonary embolism-- in the 64s   . Skin cancer   . Seizures ([place])   . CIDP (chronic inflammatory demyelinating polyneuropathy) , Ataxia, Imbalance -- f/u at [facility], PT, DPT, NCS 10/06/2019, 12:17 PM  [facility] [address] [place], [place], [address] Phone: [phone]   Fax:  [phone]  Name: [name] MRN: [id number] Date of Birth: 04/19/1933

## 2019-10-06 NOTE — Patient Instructions (Signed)
Access Code: [email or web address] URL: [email or web address] Date: 09/15/2019 Prepared by: Cherly Anderson  Exercises Seated Toe Taps - 2 x daily - 5 x weekly - 1 sets - 10 reps Seated Gastroc Stretch with Strap - 2 x daily - 5 x weekly - 1 sets - 3 reps - 15-30 sec hold Seated Gluteal Sets - 2 x daily - 5 x weekly - 1-2 sets - 10 reps Sit to Stand with Armchair - 1 x daily - 5 x weekly - 2 sets - 3-5 reps Side to Side Weight Shift with Counter Support - 1 x daily - 5 x weekly - 2 sets - 10 reps Staggered Stance Forward Backward Weight Shift with Counter Support - 1 x daily - 5 x weekly - 1 sets - 10 reps Standing Gastroc Stretch at Counter - 1 x daily - 7 x weekly - 4 sets - 1 reps - 30 hold Side Stepping with Counter Support - 1 x daily - 7 x weekly - 1 sets - 2-3 reps Standing eyes open upright at counter - 2 x daily - 7 x weekly - 1 sets - 3 reps - 30 sec hold head turns side to side - 2 x daily - 7 x weekly - 1 sets - 10 reps - 30 sec hold Standing with Head Nod - 2 x daily - 7 x weekly - 1 sets - 10 reps

## 2019-10-09 ENCOUNTER — Other Ambulatory Visit: Payer: Self-pay

## 2019-10-09 ENCOUNTER — Ambulatory Visit: Payer: Medicare Other

## 2019-10-09 DIAGNOSIS — M6281 Muscle weakness (generalized): Secondary | ICD-10-CM

## 2019-10-09 DIAGNOSIS — R2689 Other abnormalities of gait and mobility: Secondary | ICD-10-CM | POA: Diagnosis not present

## 2019-10-09 DIAGNOSIS — R2681 Unsteadiness on feet: Secondary | ICD-10-CM

## 2019-10-09 NOTE — Therapy (Signed)
[facility] [address] [place], [place], [address] Phone: [phone]   Fax:  [phone]  Physical Therapy Treatment/Recert  Patient Details  Name: [name] MRN: [id number] Date of Birth: [birth year] Referring Provider (PT): Letta Median   Encounter Date: 10/09/2019  PT End of Session - 10/09/19 1052    Visit Number  13    Number of Visits  19    Date for PT Re-Evaluation  94/85/46   90 day cert but 60 day poc   Authorization Type  medicare so 10th visit progress note    PT Start Time  1050    PT Stop Time  1132    PT Time Calculation (min)  42 min    Equipment Utilized During Treatment  Gait belt    Activity Tolerance  Patient tolerated treatment well    Behavior During Therapy  WFL for tasks assessed/performed       Past Medical History:  Diagnosis Date  . Allergic rhinitis   . Anemia   . Arthritis   . Asthma -COPD   . Blood transfusion 1957  . Breast mass    right breast in milk duct, bx neg  . Chronic fatigue syndrome   . Chronic inflammatory demyelinating polyneuropathy ([place]) 03/2011  . chronic sinusitis 08/08/2012  . Colon polyp   . Constipation   . COPD (chronic obstructive pulmonary disease) ([place])   . Cystocele 09/16/2012  . GERD (gastroesophageal reflux disease)   . Hyperlipidemia   . Hypertension   . Hypothyroid   . Neurogenic bladder 2013   husband caths pt, manages with timed void  . Osteoporosis   . Pulmonary embolism ([place])    1980s  . Renal cyst    Non-complex, contrast MRI 2013 with L>R non-complex cysts, dominant 3.7 left lower pole  . Seizures ([place])    1990 last seizures on meds Phenobarb  . Shingles late 58's  . Skin cancer    squamous cell  . Thyroid nodule    Right thyroid lobe, only seen on Sagittal imaging measures 2.3 cm in craniocaudal dimension and appears stable  . Tubular adenoma   . Vitamin D deficiency     Past Surgical History:  Procedure  Laterality Date  . ABDOMINAL HYSTERECTOMY    . ANTERIOR FUSION CLIVUS-C2 EXTRAORAL W/ ODONTOID EXCISION  8/14  . APPENDECTOMY    . BASAL CELL CARCINOMA EXCISION     face  . BLADDER SUSPENSION    . BREAST DUCTAL SYSTEM EXCISION Right 01/15/2014   Procedure: EXCISION DUCTAL SYSTEM RIGHT BREAST;  Surgeon: Stark Klein, MD;  Location: [facility];  Service: General;  Laterality: Right;  . BREAST EXCISIONAL BIOPSY Right   . CARPAL TUNNEL RELEASE     right  . CATARACT EXTRACTION     bilateral  . cervical neck ablation     x 7, C3-C6/3 screws and plate  . CESAREAN SECTION    . CYSTOCELE REPAIR    . DILATION AND CURETTAGE OF UTERUS    . duptyren's contracture right hand    . FINGER ARTHRODESIS Right 05/06/2015   Procedure: RIGHT RING PROXIMAL INTERPHALANGEAL FUSION (PIP);  Surgeon: Leanora Cover, MD;  Location: [facility];  Service: Orthopedics;  Laterality: Right;  . FINGER GANGLION CYST EXCISION     right  . HEMICOLECTOMY Right   . JOINT REPLACEMENT     right and left basal joints of thumbs  . left finger fusion  3 fingers on left hand/one finger right  . left ovary and tube removed    . NASAL POLYP SURGERY     4 sinus surgeries  . NASAL SEPTUM SURGERY    . PANNICULECTOMY    . PROXIMAL INTERPHALANGEAL FUSION (PIP) Left 09/09/2013   Procedure: FUSION LEFT INDEX PROXIMAL INTERPHALANGEAL JOINT (PIP);  Surgeon: Cammie Sickle., MD;  Location: [facility];  Service: Orthopedics;  Laterality: Left;  . right median nerve decompression    . SHOULDER ARTHROSCOPY     x2 left, 1 right  . sinus surgeries     x 4  . squamous lesions removed     neck and face  . STERIOD INJECTION Right 05/06/2015   Procedure: STEROID INJECTION;  Surgeon: Leanora Cover, MD;  Location: [facility];  Service: Orthopedics;  Laterality: Right;  Right Index Finger Proximal InterPhalangeal Joint Injection  . TONSILLECTOMY AND ADENOIDECTOMY    . TUBAL LIGATION     . vocal polyps removed      There were no vitals filed for this visit.  Subjective Assessment - 10/09/19 1052    Subjective  Pt report that she is doing well.    Pertinent History  pt also takes carbamezepine ER 100 mg 4x/day but not found in meds. OA, asthma, COPD, HTN, neuropathy, ant cervical fusion, breast CA, arthroscopic left knee sg, 3 frozen shoulders, Right basal joint replacement Rt hand, left middle finger fusion, carpal tunnel syndrome, follows with neuro at [facility] with long h/o epilepsy.    Patient Stated Goals  Improve her balance and gait.    Currently in Pain?  No/denies                        [facility] Adult PT Treatment/Exercise - 10/09/19 1059      Transfers   Transfers  Sit to Stand;Stand to Sit    Sit to Stand  5: Supervision    Sit to Stand Details  Verbal cues for precautions/safety    Sit to Stand Details (indicate cue type and reason)  Pt was cued to not put hands on walker when going to rise but push on chair.    Five time sit to stand comments   17.25 without hands, with hands=12.29 sec    Stand to Sit  5: Supervision    Stand to Sit Details (indicate cue type and reason)  Verbal cues for precautions/safety    Stand to Sit Details  Pt was cued to lock rollator prior to sitting      Ambulation/Gait   Ambulation/Gait  Yes    Ambulation/Gait Assistance  6: Modified independent (Device/Increase time)    Ambulation Distance (Feet)  115 Feet    Assistive device  Rollator    Gait Pattern  Step-through pattern;Decreased hip/knee flexion - right;Decreased hip/knee flexion - left    Ambulation Surface  Level;Indoor    Gait velocity  16.04 sec=0.71ms      Standardized Balance Assessment   Standardized Balance Assessment  Timed Up and Go Test      Timed Up and Go Test   TUG  Normal TUG    Normal TUG (seconds)  15      Neuro Re-ed    Neuro Re-ed Details   Corner balance exercises: standing with feet apart eyes open 30 sec x 2, standing feet part  with head turns x 10 left/right and x 10 up/down, staggered stance x 30 sec each position.  Standing on pillow 30 sec x 2. At counter: sidestepping with UE support 6' x 4 then marching gait 6' x 4 with counter support and HHA then with just 1 hand on counter 6' x 2. Stepping posterior x 10 each leg quickly to mimick stepping strategy. Posterior trust fall x 6 with posterior stepping strategy with 1 hand on counter. Pt was initially losing balance posterior without UE support in static stance but did improve as went on. Able to demonstrate some hip stratedy to help. With stepping strategy pt uses right leg and has decreased step. CGA/min assist with holding counter with 1 hand as well to help with recovery.             PT Education - 10/09/19 1139    Education Details  Educated on recert plan and discussed results of testing with 5 x sit sto stand, TUG and gait speed.    Person(s) Educated  Patient    Methods  Explanation    Comprehension  Verbalized understanding       PT Short Term Goals - 10/09/19 1105      PT SHORT TERM GOAL #1   Title  Pt will be independent with initial HEP for ROM, strengthening and balance with assist of husband.    Baseline  Pt reports that she has been doing her exercises 2x/day as instructed    Time  4    Period  Weeks    Status  Achieved    Target Date  09/13/19      PT SHORT TERM GOAL #2   Title  Pt will ambulate >400' on level surfaces mod I with rollator for all improved mobility.    Baseline  09/15/19 460' with rollator mod I    Time  4    Period  Weeks    Status  Achieved    Target Date  09/13/19      PT SHORT TERM GOAL #3   Title  Pt will decrease 5 x sit to stand from 16.86 sec to <12 sec from mat with UE support for improved balance.    Baseline  29 sec on 5 x sit to stand from mat on 09/15/19, 20 sec on 10/06/19 with light UE support, 10/09/19 12.29 sec with hands and 17.25 sec without    Time  4    Period  Weeks    Status  Partially Met     Target Date  09/13/19      PT SHORT TERM GOAL #4   Title  Pt will perform transfers from varied surfaces mod I for improved safety.    Baseline  Pt still needs supervision for safety    Time  4    Period  Weeks    Status  Not Met    Target Date  09/13/19      PT SHORT TERM GOAL #5   Title  Pt will increase DF PROM by 10 degrees each ankle for improved mobility.    Baseline  right ankle=-25 and left=-8 degrees on 08/14/19 seated with knee bent. 4/19/21right ankle A/PROM=-14/-2,  left ankle DF=-18/-4 degrees    Time  4    Period  Weeks    Status  Partially Met    Target Date  09/13/19        PT Long Term Goals - 10/09/19 1101      PT LONG TERM GOAL #1   Title  Pt will be able to perform progressive HEP with husband  to continue strength, balance and functional mobility gains on own.    Time  8    Period  Weeks    Status  New      PT LONG TERM GOAL #2   Title  Pt will ambulate with rollator on varied surfaces >500' supervision for improved community mobility.    Time  8    Period  Weeks    Status  New      PT LONG TERM GOAL #3   Title  Pt will increase gait speed from 0.61ms to >0.758m for improved gait safety in community.    Baseline  .57 m/s on 09/29/19. 0.6239mon 10/09/19    Time  8    Period  Weeks    Status  Partially Met      PT LONG TERM GOAL #4   Title  Pt will decrease TUG from 19.52 sec to <16 sec for improved balance and functional mobility.    Baseline  16.12 sec on 09/29/19, TUG=15 sec on 10/09/19    Time  8    Period  Weeks    Status  Achieved      PT LONG TERM GOAL #5   Title  Pt will be able to maintain standing at counter >1 min without UE support for improved balance supervision.    Baseline  stood >1 min without UE support supervision on 10/09/19    Time  8    Period  Weeks    Status  Achieved      Updated goals: PT Short Term Goals - 10/09/19 1154      PT SHORT TERM GOAL #1   Title  STGs=LTGs      PT Long Term Goals - 10/09/19 1155      PT  LONG TERM GOAL #1   Title  Pt will be able to perform progressive HEP with husband to continue strength, balance and functional mobility gains on own.    Baseline  Continue to add to HEP    Time  4    Period  Weeks    Status  On-going    Target Date  11/12/19      PT LONG TERM GOAL #2   Title  Pt will ambulate with rollator on varied surfaces >500' supervision for improved community mobility.    Time  4    Period  Weeks    Status  On-going    Target Date  11/12/19      PT LONG TERM GOAL #3   Title  Pt will increase gait speed from 0.83m48mo >0.53m/s38mr improved gait safety in community.    Baseline  .57 m/s on 09/29/19. 0.72m/s29m5/13/21    Time  4    Period  Weeks    Status  On-going    Target Date  11/12/19      PT LONG TERM GOAL #4   Title  Pt will decrease 5 x sit to stand without UE support from chair to <16 sec for improved balance and functional mobility.    Baseline  17.25 sec without hands from chair.    Time  4    Period  Weeks    Status  New    Target Date  11/12/19            Plan - 10/09/19 1108    Clinical Impression Statement  PT reassessed remaining LTGs today. Pt continues to show progress. She met TUG goal decreasing time to  15 sec today. Also decreased 5 x sit to stand to 12.29 sec with UE and is now also able to perform without UE support in 17.25 sec. Was just short of goal. Both test still indicate fall risk. PT continues to work more on improving vestibular and visual components with balance as well as getting patient to utilize hip and stepping strategy to help compensate for neuropathy in ankles. Pt increased gait speed to 0.4ms which is safe household ambulator speed. Pt doing well with use of rollator at this time. Still needs cues for safety with transfers so supervision level. Pt will benefit from continued PT to continue to work more on balance and safety with functional mobility.    Personal Factors and Comorbidities  Comorbidity 3+     Comorbidities  OA, COPD, HTN, neuropathy, h/o of epilepsy    Examination-Activity Limitations  Bathing;Stand;Locomotion Level;Transfers;Stairs    Examination-Participation Restrictions  Community Activity;Meal Prep    Stability/Clinical Decision Making  Evolving/Moderate complexity    Rehab Potential  Good    PT Frequency  2x / week   followed by 1x/week for 2 weeks   PT Duration  2 weeks    PT Treatment/Interventions  ADLs/Self Care Home Management;Aquatic Therapy;Cryotherapy;Moist Heat;DME Instruction;Gait training;Stair training;Functional mobility training;Therapeutic activities;Therapeutic exercise;Orthotic Fit/Training;Patient/family education;Neuromuscular re-education;Balance training;Passive range of motion;Manual techniques    PT Next Visit Plan  Continue with balance training focusing more on vestibular and visual strategies as well as hip and step strategy training.    Consulted and Agree with Plan of Care  Patient       Patient will benefit from skilled therapeutic intervention in order to improve the following deficits and impairments:  Abnormal gait, Decreased balance, Decreased knowledge of use of DME, Decreased range of motion, Decreased mobility, Decreased strength, Impaired sensation  Visit Diagnosis: Other abnormalities of gait and mobility  Muscle weakness (generalized)  Unsteadiness on feet     Problem List Patient Active Problem List   Diagnosis Date Noted  . PCP NOTES >>>>>>>>>>>>>>>>>>> 09/16/2015  . Multiple thyroid nodules 03/30/2015  . Annual physical exam 08/07/2014  . Bloody discharge from nipple - s/p surgery, resolved  12/29/2013  . Breast mass, right 12/11/2013  . At high risk for falls 12/11/2013  . Renal cysts, acquired, bilateral--per urology 05/04/2013  . Elevated LFTs 04/01/2013  . Cervical spinal stenosis 11/21/2012  . Nondiabetic gastroparesis 08/20/2012  . Hypothyroidism 08/08/2012  . Gastroparesis 05/06/2012  . Muscle cramp 01/19/2012   . Fatigue 01/14/2012  . Neurogenic bladder, prolapse ,h/o urinary retention, self cath, sees urology 11/05/2011  . Fecal incontinence 10/03/2011  . Osteoporosis 07/24/2011  . Adenoma of cecum 07/17/2011  . Chronic constipation 07/17/2011  . Skin lesion 07/08/2011  . Chronic neck pain 04/16/2011  . Rectocele 04/16/2011  . Hypersomnia-- on dextroamphetamine 04/16/2011  . Vitamin D deficiency 04/16/2011  . Family history of colon cancer 04/16/2011  . Abnormal blood chemistry 04/16/2011  . Anemia   . COPD (chronic obstructive pulmonary disease) gold stage B   . Depression   . GERD (gastroesophageal reflux disease)   . Hyperlipidemia   . Hypertension   . Allergic rhinitis   . Pulmonary embolism-- in the 884s  . Skin cancer   . [place]ures [place]   . CIDP (chronic inflammatory demyelinating polyneuropathy) , Ataxia, Imbalance -- [facility] 10/09/2019, 11:48 AM  [id number] [place] [address]  Phone: [phone]   Fax:  [phone]  Name: [name] MRN: [id number] Date of Birth: 07/16/1932

## 2019-10-13 ENCOUNTER — Ambulatory Visit: Payer: Medicare Other

## 2019-10-13 ENCOUNTER — Other Ambulatory Visit: Payer: Self-pay

## 2019-10-13 DIAGNOSIS — R2681 Unsteadiness on feet: Secondary | ICD-10-CM

## 2019-10-13 DIAGNOSIS — R2689 Other abnormalities of gait and mobility: Secondary | ICD-10-CM

## 2019-10-13 DIAGNOSIS — M6281 Muscle weakness (generalized): Secondary | ICD-10-CM

## 2019-10-13 NOTE — Therapy (Signed)
[facility] [address] [place], [place], [address] Phone: [phone]   Fax:  [phone]  Physical Therapy Treatment  Patient Details  Name: [name] MRN: [id number] Date of Birth: 1933-01-10 Referring Provider (PT): Letta Median   Encounter Date: 10/13/2019  PT End of Session - 10/13/19 1102    Visit Number  14    Number of Visits  19    Date for PT Re-Evaluation  [id number]   90 day cert but 60 day poc   Authorization Type  medicare so 10th visit progress note    PT Start Time  1100    PT Stop Time  1140    PT Time Calculation (min)  40 min    Equipment Utilized During Treatment  Gait belt    Activity Tolerance  Patient tolerated treatment well    Behavior During Therapy  [facility] for tasks assessed/performed       Past Medical History:  Diagnosis Date  . Allergic rhinitis   . Anemia   . Arthritis   . Asthma -COPD   . Blood transfusion 1957  . Breast mass    right breast in milk duct, bx neg  . Chronic fatigue syndrome   . Chronic inflammatory demyelinating polyneuropathy ([place]) 03/2011  . chronic sinusitis 08/08/2012  . Colon polyp   . Constipation   . COPD (chronic obstructive pulmonary disease) ([place])   . Cystocele 09/16/2012  . GERD (gastroesophageal reflux disease)   . Hyperlipidemia   . Hypertension   . Hypothyroid   . Neurogenic bladder 2013   husband caths pt, manages with timed void  . Osteoporosis   . Pulmonary embolism ([place])    1980s  . Renal cyst    Non-complex, contrast MRI 2013 with L>R non-complex cysts, dominant 3.7 left lower pole  . Seizures ([place])    1990 last seizures on meds Phenobarb  . Shingles late 40's  . Skin cancer    squamous cell  . Thyroid nodule    Right thyroid lobe, only seen on Sagittal imaging measures 2.3 cm in craniocaudal dimension and appears stable  . Tubular adenoma   . Vitamin D deficiency     Past Surgical History:  Procedure Laterality  Date  . ABDOMINAL HYSTERECTOMY    . ANTERIOR FUSION CLIVUS-C2 EXTRAORAL W/ ODONTOID EXCISION  8/14  . APPENDECTOMY    . BASAL CELL CARCINOMA EXCISION     face  . BLADDER SUSPENSION    . BREAST DUCTAL SYSTEM EXCISION Right 01/15/2014   Procedure: EXCISION DUCTAL SYSTEM RIGHT BREAST;  Surgeon: Stark Klein, MD;  Location: [facility];  Service: General;  Laterality: Right;  . BREAST EXCISIONAL BIOPSY Right   . CARPAL TUNNEL RELEASE     right  . CATARACT EXTRACTION     bilateral  . cervical neck ablation     x 7, C3-C6/3 screws and plate  . CESAREAN SECTION    . CYSTOCELE REPAIR    . DILATION AND CURETTAGE OF UTERUS    . duptyren's contracture right hand    . FINGER ARTHRODESIS Right 05/06/2015   Procedure: RIGHT RING PROXIMAL INTERPHALANGEAL FUSION (PIP);  Surgeon: Leanora Cover, MD;  Location: [facility];  Service: Orthopedics;  Laterality: Right;  . FINGER GANGLION CYST EXCISION     right  . HEMICOLECTOMY Right   . JOINT REPLACEMENT     right and left basal joints of thumbs  . left finger fusion  3 fingers on left hand/one finger right  . left ovary and tube removed    . NASAL POLYP SURGERY     4 sinus surgeries  . NASAL SEPTUM SURGERY    . PANNICULECTOMY    . PROXIMAL INTERPHALANGEAL FUSION (PIP) Left 09/09/2013   Procedure: FUSION LEFT INDEX PROXIMAL INTERPHALANGEAL JOINT (PIP);  Surgeon: Cammie Sickle., MD;  Location: [facility];  Service: Orthopedics;  Laterality: Left;  . right median nerve decompression    . SHOULDER ARTHROSCOPY     x2 left, 1 right  . sinus surgeries     x 4  . squamous lesions removed     neck and face  . STERIOD INJECTION Right 05/06/2015   Procedure: STEROID INJECTION;  Surgeon: Leanora Cover, MD;  Location: [facility];  Service: Orthopedics;  Laterality: Right;  Right Index Finger Proximal InterPhalangeal Joint Injection  . TONSILLECTOMY AND ADENOIDECTOMY    . TUBAL LIGATION    .  vocal polyps removed      There were no vitals filed for this visit.  Subjective Assessment - 10/13/19 1101    Subjective  Pt denies any new issues.    Pertinent History  pt also takes carbamezepine ER 100 mg 4x/day but not found in meds. OA, asthma, COPD, HTN, neuropathy, ant cervical fusion, breast CA, arthroscopic left knee sg, 3 frozen shoulders, Right basal joint replacement Rt hand, left middle finger fusion, carpal tunnel syndrome, follows with neuro at [facility] with long h/o epilepsy.    Patient Stated Goals  Improve her balance and gait.    Currently in Pain?  No/denies                        [facility] Adult PT Treatment/Exercise - 10/13/19 1102      Transfers   Transfers  Sit to Stand;Stand to Sit    Sit to Stand  5: Supervision    Comments  Pt performed sit to stand x 5 from rollator being sure to not push back to make it slide. Sit to stand x 5 from mat without UE support      Ambulation/Gait   Ambulation/Gait  Yes    Ambulation/Gait Assistance  6: Modified independent (Device/Increase time)    Ambulation/Gait Assistance Details  Pt caught left toes twice during gait but able to recover on own    Ambulation Distance (Feet)  345 Feet    Assistive device  Rollator    Gait Pattern  Step-through pattern;Decreased dorsiflexion - right;Decreased dorsiflexion - left    Ambulation Surface  Level;Indoor      Neuro Re-ed    Neuro Re-ed Details   Standing in  corner: feet apart without UE support 30 sec x 2, feet apart with head turns up/down and left/right x 10 each, eyes closed x 30 sec CGA with occasional LOB posterior. Standing with feet together 30 sec x 2. At counter: stepping posterior with bilateral UE support x 10 then lateral stepping x 10, backwards gait and forwards march with 1 UE support CGA 6' x 6, posterior trust fall to work on stepping strategy x 6 min assist. Stepping large steps posterior x 10 with only 1 UE support x 10 each leg. Standing on rockerboard  positioned ant/posterior trying to maintain level 30 sec x 2 CGA/min assist as would occasionally lean back some, rocking board ant/post with UE support on counter and CGA. 1 LOB posterior then able to control better.  PT Education - 10/13/19 1203    Education Details  Pt to continue with current HEP    Person(s) Educated  Patient    Methods  Explanation    Comprehension  Verbalized understanding       PT Short Term Goals - 10/09/19 1154      PT SHORT TERM GOAL #1   Title  STGs=LTGs        PT Long Term Goals - 10/09/19 1155      PT LONG TERM GOAL #1   Title  Pt will be able to perform progressive HEP with husband to continue strength, balance and functional mobility gains on own.    Baseline  Continue to add to HEP    Time  4    Period  Weeks    Status  On-going    Target Date  11/12/19      PT LONG TERM GOAL #2   Title  Pt will ambulate with rollator on varied surfaces >500' supervision for improved community mobility.    Time  4    Period  Weeks    Status  On-going    Target Date  11/12/19      PT LONG TERM GOAL #3   Title  Pt will increase gait speed from 0.82m/s to >0.76m/s for improved gait safety in community.    Baseline  .57 m/s on 09/29/19. 0.29m/s on 10/09/19    Time  4    Period  Weeks    Status  On-going    Target Date  11/12/19      PT LONG TERM GOAL #4   Title  Pt will decrease 5 x sit to stand without UE support from chair to <16 sec for improved balance and functional mobility.    Baseline  17.25 sec without hands from chair.    Time  4    Period  Weeks    Status  New    Target Date  11/12/19            Plan - 10/13/19 1203    Clinical Impression Statement  PT focused more on posterior stepping strategies today. Pt able to demonstrate quick, longer steps when performing activities but needed min/mod assist with trust fall scenario.    Personal Factors and Comorbidities  Comorbidity 3+    Comorbidities  OA, COPD, HTN,  neuropathy, h/o of epilepsy    Examination-Activity Limitations  Bathing;Stand;Locomotion Level;Transfers;Stairs    Examination-Participation Restrictions  Community Activity;Meal Prep    Stability/Clinical Decision Making  Evolving/Moderate complexity    Rehab Potential  Good    PT Frequency  2x / week   followed by 1x/week for 2 weeks   PT Duration  2 weeks    PT Treatment/Interventions  ADLs/Self Care Home Management;Aquatic Therapy;Cryotherapy;Moist Heat;DME Instruction;Gait training;Stair training;Functional mobility training;Therapeutic activities;Therapeutic exercise;Orthotic Fit/Training;Patient/family education;Neuromuscular re-education;Balance training;Passive range of motion;Manual techniques    PT Next Visit Plan  Continue with balance training focusing more on vestibular and visual strategies as well as hip and step strategy training.    Consulted and Agree with Plan of Care  Patient       Patient will benefit from skilled therapeutic intervention in order to improve the following deficits and impairments:  Abnormal gait, Decreased balance, Decreased knowledge of use of DME, Decreased range of motion, Decreased mobility, Decreased strength, Impaired sensation  Visit Diagnosis: Other abnormalities of gait and mobility  Muscle weakness (generalized)  Unsteadiness on feet     Problem List Patient  Active Problem List   Diagnosis Date Noted  . PCP NOTES >>>>>>>>>>>>>>>>>>> 09/16/2015  . Multiple thyroid nodules 03/30/2015  . Annual physical exam 08/07/2014  . Bloody discharge from nipple - s/p surgery, resolved  12/29/2013  . Breast mass, right 12/11/2013  . At high risk for falls 12/11/2013  . Renal cysts, acquired, bilateral--per urology 05/04/2013  . Elevated LFTs 04/01/2013  . Cervical spinal stenosis 11/21/2012  . Nondiabetic gastroparesis 08/20/2012  . Hypothyroidism 08/08/2012  . Gastroparesis 05/06/2012  . Muscle cramp 01/19/2012  . Fatigue 01/14/2012  .  Neurogenic bladder, prolapse ,h/o urinary retention, self cath, sees urology 11/05/2011  . Fecal incontinence 10/03/2011  . Osteoporosis 07/24/2011  . Adenoma of cecum 07/17/2011  . Chronic constipation 07/17/2011  . Skin lesion 07/08/2011  . Chronic neck pain 04/16/2011  . Rectocele 04/16/2011  . Hypersomnia-- on dextroamphetamine 04/16/2011  . Vitamin D deficiency 04/16/2011  . Family history of colon cancer 04/16/2011  . Abnormal blood chemistry 04/16/2011  . Anemia   . COPD (chronic obstructive pulmonary disease) gold stage B   . Depression   . GERD (gastroesophageal reflux disease)   . Hyperlipidemia   . Hypertension   . Allergic rhinitis   . Pulmonary embolism-- in the 22s   . Skin cancer   . Seizures ([place])   . CIDP (chronic inflammatory demyelinating polyneuropathy) , Ataxia, Imbalance -- f/u at [facility], PT, DPT, NCS 10/13/2019, 12:05 PM  [facility] [address] [place], [place], [address] Phone: [phone]   Fax:  [phone]  Name: Forest Otey MRN: [id number] Date of Birth: Jan 12, 1933

## 2019-10-14 DIAGNOSIS — L943 Sclerodactyly: Secondary | ICD-10-CM | POA: Diagnosis not present

## 2019-10-14 DIAGNOSIS — I7300 Raynaud's syndrome without gangrene: Secondary | ICD-10-CM | POA: Diagnosis not present

## 2019-10-16 ENCOUNTER — Ambulatory Visit: Payer: Medicare Other

## 2019-10-16 ENCOUNTER — Other Ambulatory Visit: Payer: Self-pay

## 2019-10-16 DIAGNOSIS — M6281 Muscle weakness (generalized): Secondary | ICD-10-CM

## 2019-10-16 DIAGNOSIS — R2689 Other abnormalities of gait and mobility: Secondary | ICD-10-CM

## 2019-10-16 DIAGNOSIS — R2681 Unsteadiness on feet: Secondary | ICD-10-CM

## 2019-10-16 NOTE — Therapy (Signed)
[facility] [address] [place], [place], [address] Phone: [phone]   Fax:  [phone]  Physical Therapy Treatment  Patient Details  Name: [name] MRN: [id number] Date of Birth: September 08, 1932 Referring Provider (PT): Letta Median   Encounter Date: 10/16/2019  PT End of Session - 10/16/19 1148    Visit Number  15    Number of Visits  19    Date for PT Re-Evaluation  [id number]   90 day cert but 60 day poc   Authorization Type  medicare so 10th visit progress note    PT Start Time  1100    PT Stop Time  1140    PT Time Calculation (min)  40 min    Equipment Utilized During Treatment  Gait belt    Activity Tolerance  Patient tolerated treatment well    Behavior During Therapy  [facility] for tasks assessed/performed       Past Medical History:  Diagnosis Date  . Allergic rhinitis   . Anemia   . Arthritis   . Asthma -COPD   . Blood transfusion 1957  . Breast mass    right breast in milk duct, bx neg  . Chronic fatigue syndrome   . Chronic inflammatory demyelinating polyneuropathy ([place]) 03/2011  . chronic sinusitis 08/08/2012  . Colon polyp   . Constipation   . COPD (chronic obstructive pulmonary disease) ([place])   . Cystocele 09/16/2012  . GERD (gastroesophageal reflux disease)   . Hyperlipidemia   . Hypertension   . Hypothyroid   . Neurogenic bladder 2013   husband caths pt, manages with timed void  . Osteoporosis   . Pulmonary embolism ([place])    1980s  . Renal cyst    Non-complex, contrast MRI 2013 with L>R non-complex cysts, dominant 3.7 left lower pole  . Seizures ([place])    1990 last seizures on meds Phenobarb  . Shingles late 66's  . Skin cancer    squamous cell  . Thyroid nodule    Right thyroid lobe, only seen on Sagittal imaging measures 2.3 cm in craniocaudal dimension and appears stable  . Tubular adenoma   . Vitamin D deficiency     Past Surgical History:  Procedure Laterality  Date  . ABDOMINAL HYSTERECTOMY    . ANTERIOR FUSION CLIVUS-C2 EXTRAORAL W/ ODONTOID EXCISION  8/14  . APPENDECTOMY    . BASAL CELL CARCINOMA EXCISION     face  . BLADDER SUSPENSION    . BREAST DUCTAL SYSTEM EXCISION Right 01/15/2014   Procedure: EXCISION DUCTAL SYSTEM RIGHT BREAST;  Surgeon: Stark Klein, MD;  Location: [facility];  Service: General;  Laterality: Right;  . BREAST EXCISIONAL BIOPSY Right   . CARPAL TUNNEL RELEASE     right  . CATARACT EXTRACTION     bilateral  . cervical neck ablation     x 7, C3-C6/3 screws and plate  . CESAREAN SECTION    . CYSTOCELE REPAIR    . DILATION AND CURETTAGE OF UTERUS    . duptyren's contracture right hand    . FINGER ARTHRODESIS Right 05/06/2015   Procedure: RIGHT RING PROXIMAL INTERPHALANGEAL FUSION (PIP);  Surgeon: Leanora Cover, MD;  Location: [facility];  Service: Orthopedics;  Laterality: Right;  . FINGER GANGLION CYST EXCISION     right  . HEMICOLECTOMY Right   . JOINT REPLACEMENT     right and left basal joints of thumbs  . left finger fusion  3 fingers on left hand/one finger right  . left ovary and tube removed    . NASAL POLYP SURGERY     4 sinus surgeries  . NASAL SEPTUM SURGERY    . PANNICULECTOMY    . PROXIMAL INTERPHALANGEAL FUSION (PIP) Left 09/09/2013   Procedure: FUSION LEFT INDEX PROXIMAL INTERPHALANGEAL JOINT (PIP);  Surgeon: Cammie Sickle., MD;  Location: [facility];  Service: Orthopedics;  Laterality: Left;  . right median nerve decompression    . SHOULDER ARTHROSCOPY     x2 left, 1 right  . sinus surgeries     x 4  . squamous lesions removed     neck and face  . STERIOD INJECTION Right 05/06/2015   Procedure: STEROID INJECTION;  Surgeon: Leanora Cover, MD;  Location: [facility];  Service: Orthopedics;  Laterality: Right;  Right Index Finger Proximal InterPhalangeal Joint Injection  . TONSILLECTOMY AND ADENOIDECTOMY    . TUBAL LIGATION    .  vocal polyps removed      There were no vitals filed for this visit.  Subjective Assessment - 10/16/19 1105    Subjective  Pt reports that she was at [facility] yesterday to see the rheumatologist. He is going to make a referral to her neurologist over there as well about balance. Pt reports she had a bad fall this morning when walking in the bathroom. Was using rollator and just fell straight back. She has skin tear on her right elbow (which husband banadaged) and reports hit right side of lower back and feels like it will bruise there. Huband helperd her up.    Pertinent History  pt also takes carbamezepine ER 100 mg 4x/day but not found in meds. OA, asthma, COPD, HTN, neuropathy, ant cervical fusion, breast CA, arthroscopic left knee sg, 3 frozen shoulders, Right basal joint replacement Rt hand, left middle finger fusion, carpal tunnel syndrome, follows with neuro at [facility] with long h/o epilepsy.    Patient Stated Goals  Improve her balance and gait.    Currently in Pain?  Yes    Pain Score  1     Pain Location  Flank    Pain Orientation  Right    Pain Descriptors / Indicators  Sore    Pain Type  Acute pain                        [facility] Adult PT Treatment/Exercise - 10/16/19 1108      Transfers   Transfers  Sit to Stand;Stand to Sit    Sit to Stand  5: Supervision    Comments  Pt performed sit to stand from edge of mat 5 x 2 without UE support with good control getting balance each time      Ambulation/Gait   Ambulation/Gait  Yes      Neuro Re-ed    Neuro Re-ed Details   In // bars: posterior steps x 10 each leg with UE support then lateral x 10 each side. Backwards gait and marching forward 6' x 3 each with bilateral UE then with only 1 UE support. Stepping over foam beam and back x 10 each leg with 1 UE support. Standing with feet apart x 30 sec then 30 sec then adding head turns left/right and up/down x 10 each. Standing with tossing 14 bean bags without UE support then  repeating switching hands so she had to reach across for bean bag. CGA for safety but patient  did not have any significant LOB. Standing on rockerboard positioned ant/post trying to maintain level without UE support but lost balance posterior quickly every time. Rocking board back and forth with light UE support using hips to help more. Standing on ground staggered stance x 30 sec each position.             PT Education - 10/16/19 1155    Education Details  Pt was instructed to ice right flank x 20 min on then hour off throughout day with towel between skin and ice to try to help with pain/swelling from fall.    Person(s) Educated  Patient    Methods  Explanation    Comprehension  Verbalized understanding       PT Short Term Goals - 10/09/19 1154      PT SHORT TERM GOAL #1   Title  STGs=LTGs        PT Long Term Goals - 10/09/19 1155      PT LONG TERM GOAL #1   Title  Pt will be able to perform progressive HEP with husband to continue strength, balance and functional mobility gains on own.    Baseline  Continue to add to HEP    Time  4    Period  Weeks    Status  On-going    Target Date  11/12/19      PT LONG TERM GOAL #2   Title  Pt will ambulate with rollator on varied surfaces >500' supervision for improved community mobility.    Time  4    Period  Weeks    Status  On-going    Target Date  11/12/19      PT LONG TERM GOAL #3   Title  Pt will increase gait speed from 0.70m/s to >0.41m/s for improved gait safety in community.    Baseline  .57 m/s on 09/29/19. 0.66m/s on 10/09/19    Time  4    Period  Weeks    Status  On-going    Target Date  11/12/19      PT LONG TERM GOAL #4   Title  Pt will decrease 5 x sit to stand without UE support from chair to <16 sec for improved balance and functional mobility.    Baseline  17.25 sec without hands from chair.    Time  4    Period  Weeks    Status  New    Target Date  11/12/19            Plan - 10/16/19 1159     Clinical Impression Statement  PT continued to work on stepping strategies. Pt continues to have poor posterior stepping strategy. Is doing better with static balance activities and able to add in some dynamic tasks with less LOB.    Personal Factors and Comorbidities  Comorbidity 3+    Comorbidities  OA, COPD, HTN, neuropathy, h/o of epilepsy    Examination-Activity Limitations  Bathing;Stand;Locomotion Level;Transfers;Stairs    Examination-Participation Restrictions  Community Activity;Meal Prep    Stability/Clinical Decision Making  Evolving/Moderate complexity    Rehab Potential  Good    PT Frequency  2x / week   followed by 1x/week for 2 weeks   PT Duration  2 weeks    PT Treatment/Interventions  ADLs/Self Care Home Management;Aquatic Therapy;Cryotherapy;Moist Heat;DME Instruction;Gait training;Stair training;Functional mobility training;Therapeutic activities;Therapeutic exercise;Orthotic Fit/Training;Patient/family education;Neuromuscular re-education;Balance training;Passive range of motion;Manual techniques    PT Next Visit Plan  How is she feeling since fall?  Continue with balance training focusing more on vestibular and visual strategies as well as hip and step strategy training.    Consulted and Agree with Plan of Care  Patient       Patient will benefit from skilled therapeutic intervention in order to improve the following deficits and impairments:  Abnormal gait, Decreased balance, Decreased knowledge of use of DME, Decreased range of motion, Decreased mobility, Decreased strength, Impaired sensation  Visit Diagnosis: Other abnormalities of gait and mobility  Muscle weakness (generalized)  Unsteadiness on feet     Problem List Patient Active Problem List   Diagnosis Date Noted  . PCP NOTES >>>>>>>>>>>>>>>>>>> 09/16/2015  . Multiple thyroid nodules 03/30/2015  . Annual physical exam 08/07/2014  . Bloody discharge from nipple - s/p surgery, resolved  12/29/2013  .  Breast mass, right 12/11/2013  . At high risk for falls 12/11/2013  . Renal cysts, acquired, bilateral--per urology 05/04/2013  . Elevated LFTs 04/01/2013  . Cervical spinal stenosis 11/21/2012  . Nondiabetic gastroparesis 08/20/2012  . Hypothyroidism 08/08/2012  . Gastroparesis 05/06/2012  . Muscle cramp 01/19/2012  . Fatigue 01/14/2012  . Neurogenic bladder, prolapse ,h/o urinary retention, self cath, sees urology 11/05/2011  . Fecal incontinence 10/03/2011  . Osteoporosis 07/24/2011  . Adenoma of cecum 07/17/2011  . Chronic constipation 07/17/2011  . Skin lesion 07/08/2011  . Chronic neck pain 04/16/2011  . Rectocele 04/16/2011  . Hypersomnia-- on dextroamphetamine 04/16/2011  . Vitamin D deficiency 04/16/2011  . Family history of colon cancer 04/16/2011  . Abnormal blood chemistry 04/16/2011  . Anemia   . COPD (chronic obstructive pulmonary disease) gold stage B   . Depression   . GERD (gastroesophageal reflux disease)   . Hyperlipidemia   . Hypertension   . Allergic rhinitis   . Pulmonary embolism-- in the 75s   . Skin cancer   . Seizures ([place])   . CIDP (chronic inflammatory demyelinating polyneuropathy) , Ataxia, Imbalance -- f/u at [facility], PT, DPT, NCS 10/16/2019, 12:49 PM  [facility] [address] [place], [place], [address] Phone: [phone]   Fax:  [phone]  Name: Genesi Buffin MRN: [id number] Date of Birth: 08-11-1932

## 2019-10-22 ENCOUNTER — Other Ambulatory Visit: Payer: Self-pay

## 2019-10-22 ENCOUNTER — Ambulatory Visit: Payer: Medicare Other

## 2019-10-22 DIAGNOSIS — R2689 Other abnormalities of gait and mobility: Secondary | ICD-10-CM | POA: Diagnosis not present

## 2019-10-22 DIAGNOSIS — M6281 Muscle weakness (generalized): Secondary | ICD-10-CM

## 2019-10-22 DIAGNOSIS — R2681 Unsteadiness on feet: Secondary | ICD-10-CM

## 2019-10-22 NOTE — Therapy (Signed)
[facility] [address] [place], [place], [address] Phone: [phone]   Fax:  [phone]  Physical Therapy Treatment  Patient Details  Name: [name] MRN: [id number] Date of Birth: 10/12/1932 Referring Provider (PT): Letta Median   Encounter Date: 10/22/2019  PT End of Session - 10/22/19 1020    Visit Number  16    Number of Visits  19    Date for PT Re-Evaluation  [id number]   90 day cert but 60 day poc   Authorization Type  medicare so 10th visit progress note    PT Start Time  1018    PT Stop Time  1059    PT Time Calculation (min)  41 min    Equipment Utilized During Treatment  Gait belt    Activity Tolerance  Patient tolerated treatment well    Behavior During Therapy  WFL for tasks assessed/performed       Past Medical History:  Diagnosis Date  . Allergic rhinitis   . Anemia   . Arthritis   . Asthma -COPD   . Blood transfusion 1957  . Breast mass    right breast in milk duct, bx neg  . Chronic fatigue syndrome   . Chronic inflammatory demyelinating polyneuropathy ([place]) 03/2011  . chronic sinusitis 08/08/2012  . Colon polyp   . Constipation   . COPD (chronic obstructive pulmonary disease) ([place])   . Cystocele 09/16/2012  . GERD (gastroesophageal reflux disease)   . Hyperlipidemia   . Hypertension   . Hypothyroid   . Neurogenic bladder 2013   husband caths pt, manages with timed void  . Osteoporosis   . Pulmonary embolism ([place])    1980s  . Renal cyst    Non-complex, contrast MRI 2013 with L>R non-complex cysts, dominant 3.7 left lower pole  . Seizures ([place])    1990 last seizures on meds Phenobarb  . Shingles late 1's  . Skin cancer    squamous cell  . Thyroid nodule    Right thyroid lobe, only seen on Sagittal imaging measures 2.3 cm in craniocaudal dimension and appears stable  . Tubular adenoma   . Vitamin D deficiency     Past Surgical History:  Procedure Laterality  Date  . ABDOMINAL HYSTERECTOMY    . ANTERIOR FUSION CLIVUS-C2 EXTRAORAL W/ ODONTOID EXCISION  8/14  . APPENDECTOMY    . BASAL CELL CARCINOMA EXCISION     face  . BLADDER SUSPENSION    . BREAST DUCTAL SYSTEM EXCISION Right 01/15/2014   Procedure: EXCISION DUCTAL SYSTEM RIGHT BREAST;  Surgeon: Stark Klein, MD;  Location: [facility];  Service: General;  Laterality: Right;  . BREAST EXCISIONAL BIOPSY Right   . CARPAL TUNNEL RELEASE     right  . CATARACT EXTRACTION     bilateral  . cervical neck ablation     x 7, C3-C6/3 screws and plate  . CESAREAN SECTION    . CYSTOCELE REPAIR    . DILATION AND CURETTAGE OF UTERUS    . duptyren's contracture right hand    . FINGER ARTHRODESIS Right 05/06/2015   Procedure: RIGHT RING PROXIMAL INTERPHALANGEAL FUSION (PIP);  Surgeon: Leanora Cover, MD;  Location: [facility];  Service: Orthopedics;  Laterality: Right;  . FINGER GANGLION CYST EXCISION     right  . HEMICOLECTOMY Right   . JOINT REPLACEMENT     right and left basal joints of thumbs  . left finger fusion  3 fingers on left hand/one finger right  . left ovary and tube removed    . NASAL POLYP SURGERY     4 sinus surgeries  . NASAL SEPTUM SURGERY    . PANNICULECTOMY    . PROXIMAL INTERPHALANGEAL FUSION (PIP) Left 09/09/2013   Procedure: FUSION LEFT INDEX PROXIMAL INTERPHALANGEAL JOINT (PIP);  Surgeon: Cammie Sickle., MD;  Location: [facility];  Service: Orthopedics;  Laterality: Left;  . right median nerve decompression    . SHOULDER ARTHROSCOPY     x2 left, 1 right  . sinus surgeries     x 4  . squamous lesions removed     neck and face  . STERIOD INJECTION Right 05/06/2015   Procedure: STEROID INJECTION;  Surgeon: Leanora Cover, MD;  Location: [facility];  Service: Orthopedics;  Laterality: Right;  Right Index Finger Proximal InterPhalangeal Joint Injection  . TONSILLECTOMY AND ADENOIDECTOMY    . TUBAL LIGATION    .  vocal polyps removed      There were no vitals filed for this visit.  Subjective Assessment - 10/22/19 1019    Subjective  Pt reports that she is doing well. Not having anymore pain from fall the other week.    Pertinent History  pt also takes carbamezepine ER 100 mg 4x/day but not found in meds. OA, asthma, COPD, HTN, neuropathy, ant cervical fusion, breast CA, arthroscopic left knee sg, 3 frozen shoulders, Right basal joint replacement Rt hand, left middle finger fusion, carpal tunnel syndrome, follows with neuro at [facility] with long h/o epilepsy.    Patient Stated Goals  Improve her balance and gait.    Currently in Pain?  No/denies                        [facility] Adult PT Treatment/Exercise - 10/22/19 1021      Transfers   Transfers  Sit to Stand;Stand to Sit    Sit to Stand  5: Supervision    Sit to Stand Details (indicate cue type and reason)  Pt was cued to remember to back all the way up and lock brakes on rollator prior to sitting.    Stand to Sit  5: Supervision      Ambulation/Gait   Ambulation/Gait  Yes    Ambulation/Gait Assistance  5: Supervision    Ambulation/Gait Assistance Details  Pt had a couple episodes of catching left toes during gait but able to correct on own. Pt was cued to focus on clearing feet.    Ambulation Distance (Feet)  1000 Feet    Assistive device  Rollator    Gait Pattern  Step-through pattern      Neuro Re-ed    Neuro Re-ed Details   In // bars: standing on airex without UE support 30 sec x 2 CGA. Pt has increased sway but was able to start to correct more. Standing on floor with eyes open x 30 sec then eyes closed x 30 sec with increased sway. Stepping posterior x 10 each leg with light UE support CGA then repeated lateral. Posterior "trust falls" with light UE support x 6. Hip strategy practice with trying to bump // bar x 10 each side then performed trying to bump ant x 10 and posterior x 10.  Wall bumps uisng hip strategy. Close CGA/min  assist at times for safety.             PT Education - 10/22/19 1945  Education Details  Pt to continue with current HEP    Person(s) Educated  Patient    Methods  Explanation    Comprehension  Verbalized understanding       PT Short Term Goals - 10/09/19 1154      PT SHORT TERM GOAL #1   Title  STGs=LTGs        PT Long Term Goals - 10/09/19 1155      PT LONG TERM GOAL #1   Title  Pt will be able to perform progressive HEP with husband to continue strength, balance and functional mobility gains on own.    Baseline  Continue to add to HEP    Time  4    Period  Weeks    Status  On-going    Target Date  11/12/19      PT LONG TERM GOAL #2   Title  Pt will ambulate with rollator on varied surfaces >500' supervision for improved community mobility.    Time  4    Period  Weeks    Status  On-going    Target Date  11/12/19      PT LONG TERM GOAL #3   Title  Pt will increase gait speed from 0.64m/s to >0.36m/s for improved gait safety in community.    Baseline  .57 m/s on 09/29/19. 0.67m/s on 10/09/19    Time  4    Period  Weeks    Status  On-going    Target Date  11/12/19      PT LONG TERM GOAL #4   Title  Pt will decrease 5 x sit to stand without UE support from chair to <16 sec for improved balance and functional mobility.    Baseline  17.25 sec without hands from chair.    Time  4    Period  Weeks    Status  New    Target Date  11/12/19            Plan - 10/22/19 1945    Clinical Impression Statement  Session focused on continued work on stepping strategies and also incorporated more hip strategy training to help with balance reactions.    Personal Factors and Comorbidities  Comorbidity 3+    Comorbidities  OA, COPD, HTN, neuropathy, h/o of epilepsy    Examination-Activity Limitations  Bathing;Stand;Locomotion Level;Transfers;Stairs    Examination-Participation Restrictions  Community Activity;Meal Prep    Stability/Clinical Decision Making   Evolving/Moderate complexity    Rehab Potential  Good    PT Frequency  2x / week   followed by 1x/week for 2 weeks   PT Duration  2 weeks    PT Treatment/Interventions  ADLs/Self Care Home Management;Aquatic Therapy;Cryotherapy;Moist Heat;DME Instruction;Gait training;Stair training;Functional mobility training;Therapeutic activities;Therapeutic exercise;Orthotic Fit/Training;Patient/family education;Neuromuscular re-education;Balance training;Passive range of motion;Manual techniques    PT Next Visit Plan  Continue with balance training focusing more on vestibular and visual strategies as well as hip and step strategy training. Can we add hip wall bumps to HEP if safe?    Consulted and Agree with Plan of Care  Patient       Patient will benefit from skilled therapeutic intervention in order to improve the following deficits and impairments:  Abnormal gait, Decreased balance, Decreased knowledge of use of DME, Decreased range of motion, Decreased mobility, Decreased strength, Impaired sensation  Visit Diagnosis: Other abnormalities of gait and mobility  Muscle weakness (generalized)  Unsteadiness on feet     Problem List Patient Active Problem List  Diagnosis Date Noted  . PCP NOTES >>>>>>>>>>>>>>>>>>> 09/16/2015  . Multiple thyroid nodules 03/30/2015  . Annual physical exam 08/07/2014  . Bloody discharge from nipple - s/p surgery, resolved  12/29/2013  . Breast mass, right 12/11/2013  . At high risk for falls 12/11/2013  . Renal cysts, acquired, bilateral--per urology 05/04/2013  . Elevated LFTs 04/01/2013  . Cervical spinal stenosis 11/21/2012  . Nondiabetic gastroparesis 08/20/2012  . Hypothyroidism 08/08/2012  . Gastroparesis 05/06/2012  . Muscle cramp 01/19/2012  . Fatigue 01/14/2012  . Neurogenic bladder, prolapse ,h/o urinary retention, self cath, sees urology 11/05/2011  . Fecal incontinence 10/03/2011  . Osteoporosis 07/24/2011  . Adenoma of cecum 07/17/2011  .  Chronic constipation 07/17/2011  . Skin lesion 07/08/2011  . Chronic neck pain 04/16/2011  . Rectocele 04/16/2011  . Hypersomnia-- on dextroamphetamine 04/16/2011  . Vitamin D deficiency 04/16/2011  . Family history of colon cancer 04/16/2011  . Abnormal blood chemistry 04/16/2011  . Anemia   . COPD (chronic obstructive pulmonary disease) gold stage B   . Depression   . GERD (gastroesophageal reflux disease)   . Hyperlipidemia   . Hypertension   . Allergic rhinitis   . Pulmonary embolism-- in the 99s   . Skin cancer   . Seizures ([place])   . CIDP (chronic inflammatory demyelinating polyneuropathy) , Ataxia, Imbalance -- f/u at [facility], PT, DPT, NCS 10/22/2019, 7:47 PM  [facility] [address] [place], [place], [address] Phone: [phone]   Fax:  [phone]  Name: [name] MRN: [id number] Date of Birth: [birth year]

## 2019-10-28 ENCOUNTER — Other Ambulatory Visit: Payer: Self-pay | Admitting: Family Medicine

## 2019-10-28 DIAGNOSIS — G4710 Hypersomnia, unspecified: Secondary | ICD-10-CM

## 2019-10-28 NOTE — Telephone Encounter (Signed)
MC-Plz see refill req/thx dmf 

## 2019-10-30 ENCOUNTER — Other Ambulatory Visit: Payer: Self-pay

## 2019-10-30 ENCOUNTER — Ambulatory Visit: Payer: Medicare Other | Attending: Family Medicine

## 2019-10-30 DIAGNOSIS — R2689 Other abnormalities of gait and mobility: Secondary | ICD-10-CM | POA: Diagnosis not present

## 2019-10-30 DIAGNOSIS — R2681 Unsteadiness on feet: Secondary | ICD-10-CM | POA: Diagnosis not present

## 2019-10-30 DIAGNOSIS — M6281 Muscle weakness (generalized): Secondary | ICD-10-CM

## 2019-10-30 NOTE — Patient Instructions (Signed)
Access Code: [email or web address] URL: [email or web address] Date: 09/15/2019 Prepared by: Cherly Anderson  Exercises Seated Toe Taps - 2 x daily - 5 x weekly - 1 sets - 10 reps Seated Gastroc Stretch with Strap - 2 x daily - 5 x weekly - 1 sets - 3 reps - 15-30 sec hold Seated Gluteal Sets - 2 x daily - 5 x weekly - 1-2 sets - 10 reps Sit to Stand with Armchair - 1 x daily - 5 x weekly - 2 sets - 3-5 reps Side to Side Weight Shift with Counter Support - 1 x daily - 5 x weekly - 2 sets - 10 reps Staggered Stance Forward Backward Weight Shift with Counter Support - 1 x daily - 5 x weekly - 1 sets - 10 reps Standing Gastroc Stretch at Counter - 1 x daily - 7 x weekly - 4 sets - 1 reps - 30 hold Side Stepping with Counter Support - 1 x daily - 7 x weekly - 1 sets - 2-3 reps Standing eyes open upright at counter - 2 x daily - 7 x weekly - 1 sets - 3 reps - 30 sec hold head turns side to side - 2 x daily - 7 x weekly - 1 sets - 10 reps - 30 sec hold Standing with Head Nod - 2 x daily - 7 x weekly - 1 sets - 10 reps Wall bumps with ankles - 1 x daily - 7 x weekly - 3 sets - 10 reps

## 2019-10-30 NOTE — Therapy (Signed)
[facility] [address] [place], [place], [address] Phone: [phone]   Fax:  [phone]  Physical Therapy Treatment  Patient Details  Name: [name] MRN: [id number] Date of Birth: 22-May-1933 Referring Provider (PT): Letta Median   Encounter Date: 10/30/2019  PT End of Session - 10/30/19 1108    Visit Number  17    Number of Visits  19    Date for PT Re-Evaluation  [id number]   90 day cert but 60 day poc   Authorization Type  medicare so 10th visit progress note    PT Start Time  1107   PT running behind   PT Stop Time  1141    PT Time Calculation (min)  34 min    Equipment Utilized During Treatment  Gait belt    Activity Tolerance  Patient tolerated treatment well    Behavior During Therapy  [facility] for tasks assessed/performed       Past Medical History:  Diagnosis Date  . Allergic rhinitis   . Anemia   . Arthritis   . Asthma -COPD   . Blood transfusion 1957  . Breast mass    right breast in milk duct, bx neg  . Chronic fatigue syndrome   . Chronic inflammatory demyelinating polyneuropathy ([place]) 03/2011  . chronic sinusitis 08/08/2012  . Colon polyp   . Constipation   . COPD (chronic obstructive pulmonary disease) ([facility])   . Cystocele 09/16/2012  . GERD (gastroesophageal reflux disease)   . Hyperlipidemia   . Hypertension   . Hypothyroid   . Neurogenic bladder 2013   husband caths pt, manages with timed void  . Osteoporosis   . Pulmonary embolism ([facility])    1980s  . Renal cyst    Non-complex, contrast MRI 2013 with L>R non-complex cysts, dominant 3.7 left lower pole  . Seizures ([place])    1990 last seizures on meds Phenobarb  . Shingles late 70's  . Skin cancer    squamous cell  . Thyroid nodule    Right thyroid lobe, only seen on Sagittal imaging measures 2.3 cm in craniocaudal dimension and appears stable  . Tubular adenoma   . Vitamin D deficiency     Past Surgical History:   Procedure Laterality Date  . ABDOMINAL HYSTERECTOMY    . ANTERIOR FUSION CLIVUS-C2 EXTRAORAL W/ ODONTOID EXCISION  8/14  . APPENDECTOMY    . BASAL CELL CARCINOMA EXCISION     face  . BLADDER SUSPENSION    . BREAST DUCTAL SYSTEM EXCISION Right 01/15/2014   Procedure: EXCISION DUCTAL SYSTEM RIGHT BREAST;  Surgeon: Stark Klein, MD;  Location: [facility];  Service: General;  Laterality: Right;  . BREAST EXCISIONAL BIOPSY Right   . CARPAL TUNNEL RELEASE     right  . CATARACT EXTRACTION     bilateral  . cervical neck ablation     x 7, C3-C6/3 screws and plate  . CESAREAN SECTION    . CYSTOCELE REPAIR    . DILATION AND CURETTAGE OF UTERUS    . duptyren's contracture right hand    . FINGER ARTHRODESIS Right 05/06/2015   Procedure: RIGHT RING PROXIMAL INTERPHALANGEAL FUSION (PIP);  Surgeon: Leanora Cover, MD;  Location: [facility];  Service: Orthopedics;  Laterality: Right;  . FINGER GANGLION CYST EXCISION     right  . HEMICOLECTOMY Right   . JOINT REPLACEMENT     right and left basal joints of thumbs  . left finger  fusion     3 fingers on left hand/one finger right  . left ovary and tube removed    . NASAL POLYP SURGERY     4 sinus surgeries  . NASAL SEPTUM SURGERY    . PANNICULECTOMY    . PROXIMAL INTERPHALANGEAL FUSION (PIP) Left 09/09/2013   Procedure: FUSION LEFT INDEX PROXIMAL INTERPHALANGEAL JOINT (PIP);  Surgeon: Cammie Sickle., MD;  Location: [facility];  Service: Orthopedics;  Laterality: Left;  . right median nerve decompression    . SHOULDER ARTHROSCOPY     x2 left, 1 right  . sinus surgeries     x 4  . squamous lesions removed     neck and face  . STERIOD INJECTION Right 05/06/2015   Procedure: STEROID INJECTION;  Surgeon: Leanora Cover, MD;  Location: [facility];  Service: Orthopedics;  Laterality: Right;  Right Index Finger Proximal InterPhalangeal Joint Injection  . TONSILLECTOMY AND ADENOIDECTOMY    .  TUBAL LIGATION    . vocal polyps removed      There were no vitals filed for this visit.  Subjective Assessment - 10/30/19 1109    Subjective  Pt reports no changes. She is doing to see PCP tomorrow for check-up.    Pertinent History  pt also takes carbamezepine ER 100 mg 4x/day but not found in meds. OA, asthma, COPD, HTN, neuropathy, ant cervical fusion, breast CA, arthroscopic left knee sg, 3 frozen shoulders, Right basal joint replacement Rt hand, left middle finger fusion, carpal tunnel syndrome, follows with neuro at [facility] with long h/o epilepsy.    Patient Stated Goals  Improve her balance and gait.    Currently in Pain?  No/denies                        [facility] Adult PT Treatment/Exercise - 10/30/19 1110      Transfers   Transfers  Sit to Stand;Stand to Sit    Sit to Stand  5: Supervision    Stand to Sit  5: Supervision    Stand to Sit Details (indicate cue type and reason)  Verbal cues for technique    Stand to Sit Details  Pt was cued to be sure to back all the way up and lock rollator prior to sitting.      Ambulation/Gait   Ambulation/Gait  Yes    Ambulation/Gait Assistance  5: Supervision;4: Min guard    Ambulation/Gait Assistance Details  Pt performed dynamic gait activitities with rollator: weaving in and out of 5 cones x 6, backwards gait 30' x 2 and marching gait 30' x 2. Pt did catch toes a couple times but was able to recover on own.    Ambulation Distance (Feet)  400 Feet    Assistive device  Rollator    Gait Pattern  Step-through pattern      Neuro Re-ed    Neuro Re-ed Details   Against wall near corner: pt performed wall bumps utilizing hip strategy 10 x 2 with verbal cues for form and close SBA.  At counter: standing feet apart with eyes closed 30 sec x 2 CGA with occasional LOB posterior, posterior stepping x 10 each leg with only light UE support each leg then lateral x 10. Side stepping along counter with very light UE support 6' x6. Close  SBA/CGA with all activities for safety.             PT Education - 10/30/19  [id number]    Education Details  added wall bumps to HEP to perform under spouse supervision.    Person(s) Educated  Patient    Methods  Explanation;Demonstration;Handout    Comprehension  Verbalized understanding       PT Short Term Goals - 10/09/19 1154      PT SHORT TERM GOAL #1   Title  STGs=LTGs        PT Long Term Goals - 10/09/19 1155      PT LONG TERM GOAL #1   Title  Pt will be able to perform progressive HEP with husband to continue strength, balance and functional mobility gains on own.    Baseline  Continue to add to HEP    Time  4    Period  Weeks    Status  On-going    Target Date  11/12/19      PT LONG TERM GOAL #2   Title  Pt will ambulate with rollator on varied surfaces >500' supervision for improved community mobility.    Time  4    Period  Weeks    Status  On-going    Target Date  11/12/19      PT LONG TERM GOAL #3   Title  Pt will increase gait speed from 0.42m/s to >0.10m/s for improved gait safety in community.    Baseline  .57 m/s on 09/29/19. 0.47m/s on 10/09/19    Time  4    Period  Weeks    Status  On-going    Target Date  11/12/19      PT LONG TERM GOAL #4   Title  Pt will decrease 5 x sit to stand without UE support from chair to <16 sec for improved balance and functional mobility.    Baseline  17.25 sec without hands from chair.    Time  4    Period  Weeks    Status  New    Target Date  11/12/19            Plan - 10/30/19 1522    Clinical Impression Statement  Pt did need some cues for safety with transfers today. She is showing improved stability with stepping activities.    Personal Factors and Comorbidities  Comorbidity 3+    Comorbidities  OA, COPD, HTN, neuropathy, h/o of epilepsy    Examination-Activity Limitations  Bathing;Stand;Locomotion Level;Transfers;Stairs    Examination-Participation Restrictions  Community Activity;Meal Prep     Stability/Clinical Decision Making  Evolving/Moderate complexity    Rehab Potential  Good    PT Frequency  2x / week   followed by 1x/week for 2 weeks   PT Duration  2 weeks    PT Treatment/Interventions  ADLs/Self Care Home Management;Aquatic Therapy;Cryotherapy;Moist Heat;DME Instruction;Gait training;Stair training;Functional mobility training;Therapeutic activities;Therapeutic exercise;Orthotic Fit/Training;Patient/family education;Neuromuscular re-education;Balance training;Passive range of motion;Manual techniques    PT Next Visit Plan  Continue with balance training focusing more on vestibular and visual strategies as well as hip and step strategy training.    Consulted and Agree with Plan of Care  Patient       Patient will benefit from skilled therapeutic intervention in order to improve the following deficits and impairments:  Abnormal gait, Decreased balance, Decreased knowledge of use of DME, Decreased range of motion, Decreased mobility, Decreased strength, Impaired sensation  Visit Diagnosis: Other abnormalities of gait and mobility  Muscle weakness (generalized)  Unsteadiness on feet     Problem List Patient Active Problem List   Diagnosis Date Noted  .  PCP NOTES >>>>>>>>>>>>>>>>>>> 09/16/2015  . Multiple thyroid nodules 03/30/2015  . Annual physical exam 08/07/2014  . Bloody discharge from nipple - s/p surgery, resolved  12/29/2013  . Breast mass, right 12/11/2013  . At high risk for falls 12/11/2013  . Renal cysts, acquired, bilateral--per urology 05/04/2013  . Elevated LFTs 04/01/2013  . Cervical spinal stenosis 11/21/2012  . Nondiabetic gastroparesis 08/20/2012  . Hypothyroidism 08/08/2012  . Gastroparesis 05/06/2012  . Muscle cramp 01/19/2012  . Fatigue 01/14/2012  . Neurogenic bladder, prolapse ,h/o urinary retention, self cath, sees urology 11/05/2011  . Fecal incontinence 10/03/2011  . Osteoporosis 07/24/2011  . Adenoma of cecum 07/17/2011  . Chronic  constipation 07/17/2011  . Skin lesion 07/08/2011  . Chronic neck pain 04/16/2011  . Rectocele 04/16/2011  . Hypersomnia-- on dextroamphetamine 04/16/2011  . Vitamin D deficiency 04/16/2011  . Family history of colon cancer 04/16/2011  . Abnormal blood chemistry 04/16/2011  . Anemia   . COPD (chronic obstructive pulmonary disease) gold stage B   . Depression   . GERD (gastroesophageal reflux disease)   . Hyperlipidemia   . Hypertension   . Allergic rhinitis   . Pulmonary embolism-- in the 52s   . Skin cancer   . Seizures ([facility])   . CIDP (chronic inflammatory demyelinating polyneuropathy) , Ataxia, Imbalance -- f/u at [facility], PT, DPT, NCS 10/30/2019, 3:23 PM  [facility] [address] [place], [place], [address] Phone: [phone]   Fax:  [phone]  Name: [name] MRN: [id number] Date of Birth: 1932/12/06

## 2019-10-31 ENCOUNTER — Encounter: Payer: Self-pay | Admitting: Family Medicine

## 2019-10-31 ENCOUNTER — Ambulatory Visit (INDEPENDENT_AMBULATORY_CARE_PROVIDER_SITE_OTHER): Payer: Medicare Other | Admitting: Family Medicine

## 2019-10-31 VITALS — BP 126/80 | HR 80 | Temp 98.0°F | Ht 63.0 in | Wt 122.2 lb

## 2019-10-31 DIAGNOSIS — Z79899 Other long term (current) drug therapy: Secondary | ICD-10-CM

## 2019-10-31 DIAGNOSIS — G6181 Chronic inflammatory demyelinating polyneuritis: Secondary | ICD-10-CM

## 2019-10-31 DIAGNOSIS — G4710 Hypersomnia, unspecified: Secondary | ICD-10-CM

## 2019-10-31 DIAGNOSIS — R569 Unspecified convulsions: Secondary | ICD-10-CM

## 2019-10-31 NOTE — Progress Notes (Signed)
[name] is a 86 y.o. female  Chief Complaint  Patient presents with  . Hypothyroidism    3 month follow up.  Pt c/o fatigue.    HPI: [name] is a 86 y.o. female here with her husband for routine f/u on chronic medical issues and medication refills.  Pt has chronic inflammatory demyelinating polyneuropathy, chronic fatigue, hypersomnia. Pt saw rheum Dr. Manuella Ghazi on 10/14/19 - Raynaud's phenomenon, ? Scleroderma. F/u annually or PRN. Neuro appt in 12/2019.  Pt and husband walking in the evening about 1/2 mile.  Appetite is stable, sleep is baseline.   Past Medical History:  Diagnosis Date  . Allergic rhinitis   . Anemia   . Arthritis   . Asthma -COPD   . Blood transfusion 1957  . Breast mass    right breast in milk duct, bx neg  . Chronic fatigue syndrome   . Chronic inflammatory demyelinating polyneuropathy ([place]) 03/2011  . chronic sinusitis 08/08/2012  . Colon polyp   . Constipation   . COPD (chronic obstructive pulmonary disease) ([place])   . Cystocele 09/16/2012  . GERD (gastroesophageal reflux disease)   . Hyperlipidemia   . Hypertension   . Hypothyroid   . Neurogenic bladder 2013   husband caths pt, manages with timed void  . Osteoporosis   . Pulmonary embolism ([place])    1980s  . Renal cyst    Non-complex, contrast MRI 2013 with L>R non-complex cysts, dominant 3.7 left lower pole  . Seizures ([place])    1990 last seizures on meds Phenobarb  . Shingles late 79's  . Skin cancer    squamous cell  . Thyroid nodule    Right thyroid lobe, only seen on Sagittal imaging measures 2.3 cm in craniocaudal dimension and appears stable  . Tubular adenoma   . Vitamin D deficiency     Past Surgical History:  Procedure Laterality Date  . ABDOMINAL HYSTERECTOMY    . ANTERIOR FUSION CLIVUS-C2 EXTRAORAL W/ ODONTOID EXCISION  8/14  . APPENDECTOMY    . BASAL CELL CARCINOMA EXCISION     face  . BLADDER SUSPENSION    . BREAST DUCTAL SYSTEM EXCISION Right  01/15/2014   Procedure: EXCISION DUCTAL SYSTEM RIGHT BREAST;  Surgeon: Stark Klein, MD;  Location: [facility];  Service: General;  Laterality: Right;  . BREAST EXCISIONAL BIOPSY Right   . CARPAL TUNNEL RELEASE     right  . CATARACT EXTRACTION     bilateral  . cervical neck ablation     x 7, C3-C6/3 screws and plate  . CESAREAN SECTION    . CYSTOCELE REPAIR    . DILATION AND CURETTAGE OF UTERUS    . duptyren's contracture right hand    . FINGER ARTHRODESIS Right 05/06/2015   Procedure: RIGHT RING PROXIMAL INTERPHALANGEAL FUSION (PIP);  Surgeon: Leanora Cover, MD;  Location: [facility];  Service: Orthopedics;  Laterality: Right;  . FINGER GANGLION CYST EXCISION     right  . HEMICOLECTOMY Right   . JOINT REPLACEMENT     right and left basal joints of thumbs  . left finger fusion     3 fingers on left hand/one finger right  . left ovary and tube removed    . NASAL POLYP SURGERY     4 sinus surgeries  . NASAL SEPTUM SURGERY    . PANNICULECTOMY    . PROXIMAL INTERPHALANGEAL FUSION (PIP) Left 09/09/2013   Procedure: FUSION LEFT INDEX PROXIMAL INTERPHALANGEAL JOINT (  PIP);  Surgeon: Cammie Sickle., MD;  Location: [facility];  Service: Orthopedics;  Laterality: Left;  . right median nerve decompression    . SHOULDER ARTHROSCOPY     x2 left, 1 right  . sinus surgeries     x 4  . squamous lesions removed     neck and face  . STERIOD INJECTION Right 05/06/2015   Procedure: STEROID INJECTION;  Surgeon: Leanora Cover, MD;  Location: [facility];  Service: Orthopedics;  Laterality: Right;  Right Index Finger Proximal InterPhalangeal Joint Injection  . TONSILLECTOMY AND ADENOIDECTOMY    . TUBAL LIGATION    . vocal polyps removed      Social History   Socioeconomic History  . Marital status: Married    Spouse name: Not on file  . Number of children: 3  . Years of education: Not on file  . Highest education level: Not on file    Occupational History  . Occupation: retired, Licensed conveyancer    Employer: RETIRED  Tobacco Use  . Smoking status: Former Smoker    Packs/day: 0.50    Years: 3.00    Pack years: 1.50    Types: Cigarettes    Quit date: 05/29/1978    Years since quitting: 41.4  . Smokeless tobacco: Never Used  . Tobacco comment: quit smoking 35 years ago  Substance and Sexual Activity  . Alcohol use: No  . Drug use: No  . Sexual activity: Yes  Other Topics Concern  . Not on file  Social History Narrative   Pt lives at home with her husband. She is originally from [place]. Moved to [place] in 2005.     3 grown children - Son is a Pharmacist, community in the area, son in [place], daughter in [place]   Pt is a retired Marine scientist   Social Determinants of Radio broadcast assistant Strain:   . Difficulty of Paying Living Expenses:   Food Insecurity:   . Worried About Charity fundraiser in the Last Year:   . Arboriculturist in the Last Year:   Transportation Needs:   . Film/video editor (Medical):   Marland Kitchen Lack of Transportation (Non-Medical):   Physical Activity:   . Days of Exercise per Week:   . Minutes of Exercise per Session:   Stress:   . Feeling of Stress :   Social Connections:   . Frequency of Communication with Friends and Family:   . Frequency of Social Gatherings with Friends and Family:   . Attends Religious Services:   . Active Member of Clubs or Organizations:   . Attends Archivist Meetings:   Marland Kitchen Marital Status:   Intimate Partner Violence:   . Fear of Current or Ex-Partner:   . Emotionally Abused:   Marland Kitchen Physically Abused:   . Sexually Abused:     Family History  Problem Relation Age of Onset  . Heart disease Mother   . Hyperlipidemia Mother        Jerilynn Mages, aunt  . Ovarian cancer Sister   . Lung cancer Sister        lung - stage 1  . Thyroid cancer Sister   . Colon cancer Father        78  . Malignant hyperthermia Cousin   . Stomach cancer Other        first cousin  . Skin cancer  Daughter   . Breast cancer Neg Hx  Immunization History  Administered Date(s) Administered  . Fluad Quad(high Dose 65+) 03/13/2019  . Influenza Split 12/27/2012  . Influenza Whole 01/28/2011  . Influenza,inj,Quad PF,6+ Mos 03/16/2014, 02/17/2015  . Influenza-Unspecified 02/12/2012, 02/05/2015  . Pneumococcal Conjugate-13 12/11/2013  . Pneumococcal Polysaccharide-23 05/29/2006  . Tdap 04/11/2011, 10/14/2018  . Zoster 04/14/2014  . Zoster Recombinat (Shingrix) 05/01/2019, 07/30/2019    Outpatient Encounter Medications as of 10/31/2019  Medication Sig  . albuterol (PROVENTIL) (2.5 MG/3ML) 0.083% nebulizer solution USE 1 VIAL IN NEBULIZER 4 TIMES DAILY  . amLODipine (NORVASC) 10 MG tablet Take 1 tablet (10 mg total) by mouth daily.  Marland Kitchen arformoterol (BROVANA) 15 MCG/2ML NEBU Take 2 mLs (15 mcg total) by nebulization 2 (two) times daily.  Marland Kitchen atorvastatin (LIPITOR) 40 MG tablet Take 1 tablet (40 mg total) by mouth daily at 12 noon.  . budesonide (PULMICORT) 0.25 MG/2ML nebulizer solution Take 2 mLs (0.25 mg total) by nebulization 2 (two) times daily.  . Cholecalciferol 25 MCG (1000 UT) capsule Take by mouth.  . desonide (DESOWEN) 0.05 % cream Apply to affected areas twice daily..  . dextroamphetamine (DEXTROSTAT) 10 MG tablet TAKE ONE TABLET BY MOUTH EVERY MORNING AT NOON  IN THE EVENING  AND AT BEDTIME  . famotidine (PEPCID) 20 MG tablet Take 20 mg by mouth 2 (two) times daily.  Marland Kitchen gabapentin (NEURONTIN) 300 MG capsule Take 300 mg by mouth at bedtime.  Marland Kitchen GABAPENTIN PO Take 300 mg by mouth. 1 at 1:00 pm for nerve/muscle pain in legs  . levothyroxine (SYNTHROID) 25 MCG tablet Take 1 tablet (25 mcg total) by mouth daily with breakfast.  . mometasone (ELOCON) 0.1 % cream Apply topically several times per week. Do not use daily consistently.  . mometasone (NASONEX) 50 MCG/ACT nasal spray PLACE 2 SPRAYS INTO THE NOSE DAILY  . montelukast (SINGULAIR) 10 MG tablet Take 1 tablet (10 mg total) by  mouth at bedtime.  . mupirocin ointment (BACTROBAN) 2 % ADD ONE INCH OF OINTMENT TO 8 OUNCES OF SINUS RINSE BOTTLE DAILY  . NON FORMULARY Salon pas pain patches/ PRN  . PHENobarbital (LUMINAL) 32.4 MG tablet TAKE 1 TABLET BY MOUTH DAILY AT 6AM AND 2 TABLETS AT 8PM  . polyethylene glycol powder (GLYCOLAX/MIRALAX) powder Take 3/4 capful dissolved in at least 8 ounces water/juice once daily. May titrate as needed  . predniSONE (DELTASONE) 20 MG tablet Take 1 tablet (20 mg total) by mouth daily.  . sodium chloride (OCEAN) 0.65 % nasal spray Place into the nose.  . TEGRETOL-XR 100 MG 12 hr tablet Take 500 mg by mouth daily.   . VENTOLIN HFA 108 (90 Base) MCG/ACT inhaler SMARTSIG:1 Puff(s) Via Inhaler Every 4 Hours PRN  . Vitamin D, Ergocalciferol, (DRISDOL) 1.25 MG (50000 UT) CAPS capsule Take 1 capsule (50,000 Units total) by mouth every 7 (seven) days.  . diclofenac sodium (VOLTAREN) 1 % GEL Apply 1 application topically as needed.   No facility-administered encounter medications on file as of 10/31/2019.     ROS: Pertinent positives and negatives noted in HPI. Remainder of ROS non-contributory   Allergies  Allergen Reactions  . Iodinated Diagnostic Agents Anaphylaxis    Cardiac arrest  . Iodine Other (See Comments)    Cardiac arrest As young adult, received iodinated contrast for IVP, went into cardiac arrest, was resuscitated and hospitalized. Has not received iodinated contrast since that episode. Reports no adverse reaction to betadine.   . Morphine Other (See Comments)    Cardiac arrest Patient has previously  tolerated hydrocodone, hydromorphone and fentanyl   . Morphine And Related Other (See Comments)    Cardiac arrest  . Metrizamide Other (See Comments)    Cardiac arrest Cardiac arrest     BP 126/80 (BP Location: Right Arm, Patient Position: Sitting, Cuff Size: Normal)   Pulse 80   Temp 98 F (36.7 C) (Temporal)   Ht 5\' 3"  (1.6 m)   Wt 122 lb 3.2 oz (55.4 kg)   SpO2  97%   BMI 21.65 kg/m   Physical Exam  Constitutional: She is oriented to person, place, and time. She appears well-developed and well-nourished. No distress.  Cardiovascular: Normal rate, regular rhythm and normal heart sounds.  Pulmonary/Chest: Effort normal and breath sounds normal.  Musculoskeletal:        General: No edema.  Neurological: She is alert and oriented to person, place, and time.  Psychiatric: She has a normal mood and affect. Her behavior is normal.     A/P:  1. CIDP (chronic inflammatory demyelinating polyneuropathy) , Ataxia, Imbalance -- f/u at [facility] - follows with neurology, stable - pt does well ambulating with rollator walker  2. Hypersomnia-- on dextroamphetamine - chronic, stable - cont dextroamphetamine 10mg  QID - no refill at this time   3. Seizures ([place]) - stable, controlled - cont current meds - drug levels stable/WNL in 07/2019  4. High risk medications (not anticoagulants) long-term use - DRUG MONITORING, PANEL 8 WITH CONFIRMATION, URINE   This visit occurred during the SARS-CoV-2 public health emergency.  Safety protocols were in place, including screening questions prior to the visit, additional usage of staff PPE, and extensive cleaning of exam room while observing appropriate contact time as indicated for disinfecting solutions.

## 2019-11-05 DIAGNOSIS — M1812 Unilateral primary osteoarthritis of first carpometacarpal joint, left hand: Secondary | ICD-10-CM | POA: Diagnosis not present

## 2019-11-06 ENCOUNTER — Other Ambulatory Visit: Payer: Self-pay

## 2019-11-06 ENCOUNTER — Ambulatory Visit: Payer: Medicare Other

## 2019-11-06 DIAGNOSIS — R2689 Other abnormalities of gait and mobility: Secondary | ICD-10-CM | POA: Diagnosis not present

## 2019-11-06 DIAGNOSIS — M6281 Muscle weakness (generalized): Secondary | ICD-10-CM

## 2019-11-06 DIAGNOSIS — R2681 Unsteadiness on feet: Secondary | ICD-10-CM

## 2019-11-06 NOTE — Therapy (Signed)
[facility] [address] [place], [place], [address] Phone: [phone]   Fax:  [phone]  Physical Therapy Treatment  Patient Details  Name: [name] MRN: [id number] Date of Birth: 05-09-1933 Referring Provider (PT): Letta Median   Encounter Date: 11/06/2019   PT End of Session - 11/06/19 1153    Visit Number 18    Number of Visits 19    Date for PT Re-Evaluation 62/69/48   90 day cert but 60 day poc   Authorization Type medicare so 10th visit progress note    PT Start Time 1147    PT Stop Time 1225    PT Time Calculation (min) 38 min    Equipment Utilized During Treatment Gait belt    Activity Tolerance Patient tolerated treatment well    Behavior During Therapy WFL for tasks assessed/performed           Past Medical History:  Diagnosis Date  . Allergic rhinitis   . Anemia   . Arthritis   . Asthma -COPD   . Blood transfusion 1957  . Breast mass    right breast in milk duct, bx neg  . Chronic fatigue syndrome   . Chronic inflammatory demyelinating polyneuropathy ([place]) 03/2011  . chronic sinusitis 08/08/2012  . Colon polyp   . Constipation   . COPD (chronic obstructive pulmonary disease) ([place])   . Cystocele 09/16/2012  . GERD (gastroesophageal reflux disease)   . Hyperlipidemia   . Hypertension   . Hypothyroid   . Neurogenic bladder 2013   husband caths pt, manages with timed void  . Osteoporosis   . Pulmonary embolism ([place])    1980s  . Renal cyst    Non-complex, contrast MRI 2013 with L>R non-complex cysts, dominant 3.7 left lower pole  . Seizures ([place])    1990 last seizures on meds Phenobarb  . Shingles late 8's  . Skin cancer    squamous cell  . Thyroid nodule    Right thyroid lobe, only seen on Sagittal imaging measures 2.3 cm in craniocaudal dimension and appears stable  . Tubular adenoma   . Vitamin D deficiency     Past Surgical History:  Procedure Laterality Date    . ABDOMINAL HYSTERECTOMY    . ANTERIOR FUSION CLIVUS-C2 EXTRAORAL W/ ODONTOID EXCISION  8/14  . APPENDECTOMY    . BASAL CELL CARCINOMA EXCISION     face  . BLADDER SUSPENSION    . BREAST DUCTAL SYSTEM EXCISION Right 01/15/2014   Procedure: EXCISION DUCTAL SYSTEM RIGHT BREAST;  Surgeon: Stark Klein, MD;  Location: [facility];  Service: General;  Laterality: Right;  . BREAST EXCISIONAL BIOPSY Right   . CARPAL TUNNEL RELEASE     right  . CATARACT EXTRACTION     bilateral  . cervical neck ablation     x 7, C3-C6/3 screws and plate  . CESAREAN SECTION    . CYSTOCELE REPAIR    . DILATION AND CURETTAGE OF UTERUS    . duptyren's contracture right hand    . FINGER ARTHRODESIS Right 05/06/2015   Procedure: RIGHT RING PROXIMAL INTERPHALANGEAL FUSION (PIP);  Surgeon: Leanora Cover, MD;  Location: [facility];  Service: Orthopedics;  Laterality: Right;  . FINGER GANGLION CYST EXCISION     right  . HEMICOLECTOMY Right   . JOINT REPLACEMENT     right and left basal joints of thumbs  . left finger fusion     3 fingers on  left hand/one finger right  . left ovary and tube removed    . NASAL POLYP SURGERY     4 sinus surgeries  . NASAL SEPTUM SURGERY    . PANNICULECTOMY    . PROXIMAL INTERPHALANGEAL FUSION (PIP) Left 09/09/2013   Procedure: FUSION LEFT INDEX PROXIMAL INTERPHALANGEAL JOINT (PIP);  Surgeon: Cammie Sickle., MD;  Location: [facility];  Service: Orthopedics;  Laterality: Left;  . right median nerve decompression    . SHOULDER ARTHROSCOPY     x2 left, 1 right  . sinus surgeries     x 4  . squamous lesions removed     neck and face  . STERIOD INJECTION Right 05/06/2015   Procedure: STEROID INJECTION;  Surgeon: Leanora Cover, MD;  Location: [facility];  Service: Orthopedics;  Laterality: Right;  Right Index Finger Proximal InterPhalangeal Joint Injection  . TONSILLECTOMY AND ADENOIDECTOMY    . TUBAL LIGATION    . vocal  polyps removed      There were no vitals filed for this visit.   Subjective Assessment - 11/06/19 1153    Subjective Pt reports that she is doing well. She saw her orthopedic surgeon yesterday as left thumb was really bothering her and they injected her and feeling great now.    Pertinent History pt also takes carbamezepine ER 100 mg 4x/day but not found in meds. OA, asthma, COPD, HTN, neuropathy, ant cervical fusion, breast CA, arthroscopic left knee sg, 3 frozen shoulders, Right basal joint replacement Rt hand, left middle finger fusion, carpal tunnel syndrome, follows with neuro at [facility] with long h/o epilepsy.    Patient Stated Goals Improve her balance and gait.    Currently in Pain? No/denies                             [facility] Adult PT Treatment/Exercise - 11/06/19 1155      Ambulation/Gait   Ambulation/Gait Yes    Ambulation/Gait Assistance 5: Supervision    Ambulation/Gait Assistance Details with activities as described below    Ambulation Distance (Feet) 345 Feet    Assistive device Rollator    Gait Pattern Step-through pattern;Decreased dorsiflexion - right;Decreased dorsiflexion - left    Gait Comments Pt ambulated around obstacle couse in clinic weaving in and out of 6 cones and then over red mat 3 laps walking 345' . Pt was able to negotiate rollator on/off mat supervision.      Neuro Re-ed    Neuro Re-ed Details  Standing at counter: stepping strategy posterior x 10 each leg then lateral stepping x 10 each side, standing feet together without UE support 30 sec x 2, feet apart eyes closed 30 sec x 2 then feet apart with head turns left/right x 10. CGA/occasional min assist as increased sway. At counter: 1 UE support marching 6' x 3 forwards then backwards gait x 3. Gait over blue mat with 1 UE support 6' x 6 then side stepping over blue mat 6' x 6 with light bilateral UE support. Wall bumps x 10 with verbal cues for proper technique.                   PT Education - 11/06/19 1237    Education Details Discussed planned discharge next visit    Person(s) Educated Patient    Methods Explanation    Comprehension Verbalized understanding  PT Short Term Goals - 10/09/19 1154      PT SHORT TERM GOAL #1   Title STGs=LTGs             PT Long Term Goals - 10/09/19 1155      PT LONG TERM GOAL #1   Title Pt will be able to perform progressive HEP with husband to continue strength, balance and functional mobility gains on own.    Baseline Continue to add to HEP    Time 4    Period Weeks    Status On-going    Target Date 11/12/19      PT LONG TERM GOAL #2   Title Pt will ambulate with rollator on varied surfaces >500' supervision for improved community mobility.    Time 4    Period Weeks    Status On-going    Target Date 11/12/19      PT LONG TERM GOAL #3   Title Pt will increase gait speed from 0.68m/s to >0.58m/s for improved gait safety in community.    Baseline .57 m/s on 09/29/19. 0.4m/s on 10/09/19    Time 4    Period Weeks    Status On-going    Target Date 11/12/19      PT LONG TERM GOAL #4   Title Pt will decrease 5 x sit to stand without UE support from chair to <16 sec for improved balance and functional mobility.    Baseline 17.25 sec without hands from chair.    Time 4    Period Weeks    Status New    Target Date 11/12/19                 Plan - 11/06/19 1238    Clinical Impression Statement Pt prefers stepping with RLE and demonstrates faster step recover with this leg. Pt needed some cuing for proper form with wall bumps today and continues to need reminders not to hold both hands on walker when rising to stand.    Personal Factors and Comorbidities Comorbidity 3+    Comorbidities OA, COPD, HTN, neuropathy, h/o of epilepsy    Examination-Activity Limitations Bathing;Stand;Locomotion Level;Transfers;Stairs    Examination-Participation Restrictions Community Activity;Meal Prep     Stability/Clinical Decision Making Evolving/Moderate complexity    Rehab Potential Good    PT Frequency 2x / week   followed by 1x/week for 2 weeks   PT Duration 2 weeks    PT Treatment/Interventions ADLs/Self Care Home Management;Aquatic Therapy;Cryotherapy;Moist Heat;DME Instruction;Gait training;Stair training;Functional mobility training;Therapeutic activities;Therapeutic exercise;Orthotic Fit/Training;Patient/family education;Neuromuscular re-education;Balance training;Passive range of motion;Manual techniques    PT Next Visit Plan Check goals for planned d/c    Consulted and Agree with Plan of Care Patient           Patient will benefit from skilled therapeutic intervention in order to improve the following deficits and impairments:  Abnormal gait, Decreased balance, Decreased knowledge of use of DME, Decreased range of motion, Decreased mobility, Decreased strength, Impaired sensation  Visit Diagnosis: Other abnormalities of gait and mobility  Unsteadiness on feet  Muscle weakness (generalized)     Problem List Patient Active Problem List   Diagnosis Date Noted  . PCP NOTES >>>>>>>>>>>>>>>>>>> 09/16/2015  . Multiple thyroid nodules 03/30/2015  . Annual physical exam 08/07/2014  . Bloody discharge from nipple - s/p surgery, resolved  12/29/2013  . Breast mass, right 12/11/2013  . At high risk for falls 12/11/2013  . Renal cysts, acquired, bilateral--per urology 05/04/2013  . Elevated LFTs 04/01/2013  .  Cervical spinal stenosis 11/21/2012  . Nondiabetic gastroparesis 08/20/2012  . Hypothyroidism 08/08/2012  . Gastroparesis 05/06/2012  . Muscle cramp 01/19/2012  . Fatigue 01/14/2012  . Neurogenic bladder, prolapse ,h/o urinary retention, self cath, sees urology 11/05/2011  . Fecal incontinence 10/03/2011  . Osteoporosis 07/24/2011  . Adenoma of cecum 07/17/2011  . Chronic constipation 07/17/2011  . Skin lesion 07/08/2011  . Chronic neck pain 04/16/2011  . Rectocele  04/16/2011  . Hypersomnia-- on dextroamphetamine 04/16/2011  . Vitamin D deficiency 04/16/2011  . Family history of colon cancer 04/16/2011  . Abnormal blood chemistry 04/16/2011  . Anemia   . COPD (chronic obstructive pulmonary disease) gold stage B   . Depression   . GERD (gastroesophageal reflux disease)   . Hyperlipidemia   . Hypertension   . Allergic rhinitis   . Pulmonary embolism-- in the 58s   . Skin cancer   . Seizures ([place])   . CIDP (chronic inflammatory demyelinating polyneuropathy) , Ataxia, Imbalance -- f/u at [facility], PT, DPT, [facility] 11/06/2019, 12:39 PM  [facility] [address] [place], [place], [address] Phone: [phone]   Fax:  [phone]  Name: Freddie Nghiem MRN: [id number] Date of Birth: 04/08/1933

## 2019-11-12 ENCOUNTER — Other Ambulatory Visit: Payer: Self-pay

## 2019-11-12 ENCOUNTER — Ambulatory Visit: Payer: Medicare Other

## 2019-11-12 DIAGNOSIS — R2681 Unsteadiness on feet: Secondary | ICD-10-CM

## 2019-11-12 DIAGNOSIS — M6281 Muscle weakness (generalized): Secondary | ICD-10-CM

## 2019-11-12 DIAGNOSIS — R2689 Other abnormalities of gait and mobility: Secondary | ICD-10-CM

## 2019-11-12 NOTE — Therapy (Signed)
[facility] [address] [place], [place], [address] Phone: [phone]   Fax:  [phone]  Physical Therapy Treatment/ Discharge Summary  Patient Details  Name: [name] MRN: [id number] Date of Birth: 05-04-1933 Referring Provider (PT): Letta Median  PHYSICAL THERAPY DISCHARGE SUMMARY  Visits from Start of Care: 19  Current functional level related to goals / functional outcomes: See clinical impression for more information. Pt is ambulating with rollator at supervision level.    Remaining deficits: Fall risk with balance deficits and weakness in ankles from neuropathy.   Education / Equipment: HEP  Plan: Patient agrees to discharge.  Patient goals were partially met. Patient is being discharged due to meeting the stated rehab goals.  ?????         Encounter Date: 11/12/2019   PT End of Session - 11/12/19 1020    Visit Number 19    Number of Visits 19    Date for PT Re-Evaluation 74/25/95   90 day cert but 60 day poc   Authorization Type medicare so 10th visit progress note    PT Start Time 6387    PT Stop Time 1052   finished early with d/c visit   PT Time Calculation (min) 34 min    Equipment Utilized During Treatment Gait belt    Activity Tolerance Patient tolerated treatment well    Behavior During Therapy WFL for tasks assessed/performed           Past Medical History:  Diagnosis Date  . Allergic rhinitis   . Anemia   . Arthritis   . Asthma -COPD   . Blood transfusion 1957  . Breast mass    right breast in milk duct, bx neg  . Chronic fatigue syndrome   . Chronic inflammatory demyelinating polyneuropathy ([place]) 03/2011  . chronic sinusitis 08/08/2012  . Colon polyp   . Constipation   . COPD (chronic obstructive pulmonary disease) ([place])   . Cystocele 09/16/2012  . GERD (gastroesophageal reflux disease)   . Hyperlipidemia   . Hypertension   . Hypothyroid   . Neurogenic  bladder 2013   husband caths pt, manages with timed void  . Osteoporosis   . Pulmonary embolism ([place])    1980s  . Renal cyst    Non-complex, contrast MRI 2013 with L>R non-complex cysts, dominant 3.7 left lower pole  . Seizures ([place])    1990 last seizures on meds Phenobarb  . Shingles late 13's  . Skin cancer    squamous cell  . Thyroid nodule    Right thyroid lobe, only seen on Sagittal imaging measures 2.3 cm in craniocaudal dimension and appears stable  . Tubular adenoma   . Vitamin D deficiency     Past Surgical History:  Procedure Laterality Date  . ABDOMINAL HYSTERECTOMY    . ANTERIOR FUSION CLIVUS-C2 EXTRAORAL W/ ODONTOID EXCISION  8/14  . APPENDECTOMY    . BASAL CELL CARCINOMA EXCISION     face  . BLADDER SUSPENSION    . BREAST DUCTAL SYSTEM EXCISION Right 01/15/2014   Procedure: EXCISION DUCTAL SYSTEM RIGHT BREAST;  Surgeon: Stark Klein, MD;  Location: [facility];  Service: General;  Laterality: Right;  . BREAST EXCISIONAL BIOPSY Right   . CARPAL TUNNEL RELEASE     right  . CATARACT EXTRACTION     bilateral  . cervical neck ablation     x 7, C3-C6/3 screws and plate  . CESAREAN SECTION    .  CYSTOCELE REPAIR    . DILATION AND CURETTAGE OF UTERUS    . duptyren's contracture right hand    . FINGER ARTHRODESIS Right 05/06/2015   Procedure: RIGHT RING PROXIMAL INTERPHALANGEAL FUSION (PIP);  Surgeon: Betha Loa, MD;  Location: [facility];  Service: Orthopedics;  Laterality: Right;  . FINGER GANGLION CYST EXCISION     right  . HEMICOLECTOMY Right   . JOINT REPLACEMENT     right and left basal joints of thumbs  . left finger fusion     3 fingers on left hand/one finger right  . left ovary and tube removed    . NASAL POLYP SURGERY     4 sinus surgeries  . NASAL SEPTUM SURGERY    . PANNICULECTOMY    . PROXIMAL INTERPHALANGEAL FUSION (PIP) Left 09/09/2013   Procedure: FUSION LEFT INDEX PROXIMAL INTERPHALANGEAL JOINT (PIP);  Surgeon:  Wyn Forster., MD;  Location: [facility];  Service: Orthopedics;  Laterality: Left;  . right median nerve decompression    . SHOULDER ARTHROSCOPY     x2 left, 1 right  . sinus surgeries     x 4  . squamous lesions removed     neck and face  . STERIOD INJECTION Right 05/06/2015   Procedure: STEROID INJECTION;  Surgeon: Betha Loa, MD;  Location: [facility];  Service: Orthopedics;  Laterality: Right;  Right Index Finger Proximal InterPhalangeal Joint Injection  . TONSILLECTOMY AND ADENOIDECTOMY    . TUBAL LIGATION    . vocal polyps removed      There were no vitals filed for this visit.   Subjective Assessment - 11/12/19 1019    Subjective Pt reports she is doing well. Did have one fall yesterday when missed bench at bottom of bed sliding down to floor. Has small rug burn on right elbow but no pain reports. Was able to get up on her own.    Pertinent History pt also takes carbamezepine ER 100 mg 4x/day but not found in meds. OA, asthma, COPD, HTN, neuropathy, ant cervical fusion, breast CA, arthroscopic left knee sg, 3 frozen shoulders, Right basal joint replacement Rt hand, left middle finger fusion, carpal tunnel syndrome, follows with neuro at [facility] with long h/o epilepsy.    Patient Stated Goals Improve her balance and gait.    Currently in Pain? No/denies                             [facility] Adult PT Treatment/Exercise - 11/12/19 1038      Transfers   Transfers Sit to Stand;Stand to Sit    Sit to Stand 6: Modified independent (Device/Increase time);5: Supervision    Sit to Stand Details Verbal cues for technique    Five time sit to stand comments  21 sec without hands and 16 sec with     Stand to Sit 6: Modified independent (Device/Increase time);5: Supervision    Stand to Sit Details (indicate cue type and reason) Verbal cues for technique    Stand to Sit Details Pt was reminded to back up fully and lock rollator and reach back  for seat before sitting.      Ambulation/Gait   Ambulation/Gait Yes    Ambulation/Gait Assistance 5: Supervision    Ambulation/Gait Assistance Details Pt had one episode of catching left toes but able to recover on own. Verbal cues to try to get more heel strike. Pt has left  foot slap with less eccentric control. Verbal cues to try to get more heel strike and focus on picking up left foot.    Ambulation Distance (Feet) 575 Feet    Assistive device Rollator    Gait Pattern Step-through pattern;Decreased dorsiflexion - right;Decreased dorsiflexion - left    Gait velocity 13.48 sec=0.25ms      Exercises   Exercises Other Exercises    Other Exercises  Reviewed medbridge HEP as noted below.           Access Code: [email or web address] URL: [email or web address] Date: 11/12/2019 Prepared by: ECherly Anderson Exercises Seated Toe Taps - 2 x daily - 5 x weekly - 1 sets - 10 reps- verbally reviewed Seated Gastroc Stretch with Strap - 2 x daily - 5 x weekly - 1 sets - 3 reps - 15-30 sec hold-  verbally reviewed Seated Gluteal Sets - 2 x daily - 5 x weekly - 1-2 sets - 10 reps-  verbally reviewed Sit to Stand with Armchair - 1 x daily - 5 x weekly - 2 sets - 3-5 reps Side to Side Weight Shift with Counter Support - 1 x daily - 5 x weekly - 2 sets - 10 reps Staggered Stance Forward Backward Weight Shift with Counter Support - 1 x daily - 5 x weekly - 1 sets - 10 reps Standing Gastroc Stretch at Counter - 1 x daily - 7 x weekly - 4 sets - 1 reps - 30 hold Side Stepping with Counter Support - 1 x daily - 7 x weekly - 1 sets - 2-3 reps Standing eyes open upright at counter - 2 x daily - 7 x weekly - 1 sets - 3 reps - 30 sec hold- performed in corner with walker in front head turns side to side - 2 x daily - 7 x weekly - 1 sets - 10 reps - 30 sec hold- performed in corner with walker in front Standing with Head Nod - 2 x daily - 7 x weekly - 1 sets - 10 reps- performed in corner with walker in  front Wall bumps with ankles - 1 x daily - 7 x weekly - 3 sets - 10 reps- verbal and tactile cues for proper form. To continue to have husband assist her.          PT Education - 11/12/19 1225    Education Details Reviewed HEP. Reviewed transfer safety again. Instructed to continue to use rollator at all times. Discharge as planned.    Person(s) Educated Patient    Methods Explanation;Demonstration    Comprehension Verbalized understanding;Returned demonstration            PT Short Term Goals - 10/09/19 1154      PT SHORT TERM GOAL #1   Title STGs=LTGs             PT Long Term Goals - 11/12/19 1038      PT LONG TERM GOAL #1   Title Pt will be able to perform progressive HEP with husband to continue strength, balance and functional mobility gains on own.    Baseline Pt has been working on HEP with husband    Time 4    Period Weeks    Status Achieved      PT LONG TERM GOAL #2   Title Pt will ambulate with rollator on varied surfaces >500' supervision for improved community mobility.    Baseline 575' with rollator supervision    Time 4  Period Weeks    Status Achieved      PT LONG TERM GOAL #3   Title Pt will increase gait speed from 0.16ms to >0.710m for improved gait safety in community.    Baseline .57 m/s on 09/29/19. 0.6238mon 10/09/19, 0.59m55mn 11/12/19    Time 4    Period Weeks    Status Achieved      PT LONG TERM GOAL #4   Title Pt will decrease 5 x sit to stand without UE support from chair to <16 sec for improved balance and functional mobility.    Baseline 17.25 sec without hands from chair. on 11/12/19 5 x sit to stand=21 without hands and 16 with hands    Time 4    Period Weeks    Status Not Met                 Plan - 11/12/19 1225    Clinical Impression Statement PT reassessed LTGs today. Pt met gait speed, gait goal and HEP goals. The only goal not met was 5 x sit to stand but has shown improvement since initial eval as able to rise  without UE support. Pt is still at supervision level with gait and transfers as needs reminders for safety at times. She is ambulating with rollator at all times. Pt still fall risk due to neuropathy affecting balance but has shown some improvements. Able to utilize hip strategy more to assist but still has no ankle strategy and difficulty with stepping strategy. Pt has been instructed in HEP to continue to work on with husband. Pt feels she has made improvements on balance over course of therapy. PT discharging as planned.    Personal Factors and Comorbidities Comorbidity 3+    Comorbidities OA, COPD, HTN, neuropathy, h/o of epilepsy    Examination-Activity Limitations Bathing;Stand;Locomotion Level;Transfers;Stairs    Examination-Participation Restrictions Community Activity;Meal Prep    Stability/Clinical Decision Making Evolving/Moderate complexity    Rehab Potential Good    PT Frequency 2x / week   followed by 1x/week for 2 weeks   PT Duration 2 weeks    PT Treatment/Interventions ADLs/Self Care Home Management;Aquatic Therapy;Cryotherapy;Moist Heat;DME Instruction;Gait training;Stair training;Functional mobility training;Therapeutic activities;Therapeutic exercise;Orthotic Fit/Training;Patient/family education;Neuromuscular re-education;Balance training;Passive range of motion;Manual techniques    PT Next Visit Plan D/C today    Consulted and Agree with Plan of Care Patient           Patient will benefit from skilled therapeutic intervention in order to improve the following deficits and impairments:  Abnormal gait, Decreased balance, Decreased knowledge of use of DME, Decreased range of motion, Decreased mobility, Decreased strength, Impaired sensation  Visit Diagnosis: Other abnormalities of gait and mobility  Muscle weakness (generalized)  Unsteadiness on feet     Problem List Patient Active Problem List   Diagnosis Date Noted  . PCP NOTES >>>>>>>>>>>>>>>>>>> 09/16/2015  .  Multiple thyroid nodules 03/30/2015  . Annual physical exam 08/07/2014  . Bloody discharge from nipple - s/p surgery, resolved  12/29/2013  . Breast mass, right 12/11/2013  . At high risk for falls 12/11/2013  . Renal cysts, acquired, bilateral--per urology 05/04/2013  . Elevated LFTs 04/01/2013  . Cervical spinal stenosis 11/21/2012  . Nondiabetic gastroparesis 08/20/2012  . Hypothyroidism 08/08/2012  . Gastroparesis 05/06/2012  . Muscle cramp 01/19/2012  . Fatigue 01/14/2012  . Neurogenic bladder, prolapse ,h/o urinary retention, self cath, sees urology 11/05/2011  . Fecal incontinence 10/03/2011  . Osteoporosis 07/24/2011  . Adenoma of cecum 07/17/2011  .  Chronic constipation 07/17/2011  . Skin lesion 07/08/2011  . Chronic neck pain 04/16/2011  . Rectocele 04/16/2011  . Hypersomnia-- on dextroamphetamine 04/16/2011  . Vitamin D deficiency 04/16/2011  . Family history of colon cancer 04/16/2011  . Abnormal blood chemistry 04/16/2011  . Anemia   . COPD (chronic obstructive pulmonary disease) gold stage B   . Depression   . GERD (gastroesophageal reflux disease)   . Hyperlipidemia   . Hypertension   . Allergic rhinitis   . Pulmonary embolism-- in the 70s   . Skin cancer   . Seizures ([place])   . CIDP (chronic inflammatory demyelinating polyneuropathy) , Ataxia, Imbalance -- f/u at [facility], PT, DPT, [facility] 11/12/2019, 12:29 PM  [facility] [address] [place], [place], [address] Phone: [phone]   Fax:  [phone]  Name: [name] MRN: [id number] Date of Birth: 10-30-1932

## 2019-11-19 ENCOUNTER — Ambulatory Visit: Payer: Medicare Other | Admitting: *Deleted

## 2019-11-29 ENCOUNTER — Other Ambulatory Visit: Payer: Self-pay | Admitting: Family Medicine

## 2019-11-29 DIAGNOSIS — G4710 Hypersomnia, unspecified: Secondary | ICD-10-CM

## 2019-12-02 ENCOUNTER — Other Ambulatory Visit: Payer: Self-pay

## 2019-12-02 NOTE — Progress Notes (Signed)
Subjective:   [name] is a 86 y.o. female who presents for an Initial Medicare Annual Wellness Visit.  Review of Systems        Objective:    Today's Vitals   12/03/19 1322  BP: 118/64  Pulse: 88  Resp: 16  Temp: 98 F (36.7 C)  TempSrc: Temporal  SpO2: 98%  Weight: 122 lb 9.6 oz (55.6 kg)  Height: 5\' 3"  (1.6 m)   Body mass index is 21.72 kg/m.  Advanced Directives 08/14/2019 10/14/2018 12/03/2017 12/03/2017 06/18/2015 05/06/2015 04/30/2015  Does Patient Have a Medical Advance Directive? Yes Yes Yes No Yes Yes Yes  Type of Advance Directive Living will Living will [place];Living will - Out of facility DNR (pink MOST or yellow form);Living will;Healthcare Power of [place];Living will [place];Living will  Does patient want to make changes to medical advance directive? No - Patient declined - - - No - Patient declined - -  Copy of [place] in Chart? - - No - copy requested - No - copy requested Yes Yes  Would patient like information on creating a medical advance directive? - - - No - Patient declined - - -  Pre-existing out of facility DNR order (yellow form or pink MOST form) - - - - - - -    Current Medications (verified) Outpatient Encounter Medications as of 12/03/2019  Medication Sig  . albuterol (PROVENTIL) (2.5 MG/3ML) 0.083% nebulizer solution USE 1 VIAL IN NEBULIZER 4 TIMES DAILY  . amLODipine (NORVASC) 10 MG tablet Take 1 tablet (10 mg total) by mouth daily.  Marland Kitchen arformoterol (BROVANA) 15 MCG/2ML NEBU Take 2 mLs (15 mcg total) by nebulization 2 (two) times daily.  Marland Kitchen atorvastatin (LIPITOR) 40 MG tablet Take 1 tablet (40 mg total) by mouth daily at 12 noon.  . budesonide (PULMICORT) 0.25 MG/2ML nebulizer solution Take 2 mLs (0.25 mg total) by nebulization 2 (two) times daily.  . Cholecalciferol 25 MCG (1000 UT) capsule Take by mouth.  . desonide (DESOWEN) 0.05 % cream Apply  to affected areas twice daily..  . dextroamphetamine (DEXTROSTAT) 10 MG tablet TAKE ONE TABLET BY MOUTH EVERY MORNING AT NOON  IN THE EVENING  AND AT BEDTIME  . diclofenac sodium (VOLTAREN) 1 % GEL Apply 1 application topically as needed.  . famotidine (PEPCID) 20 MG tablet Take 20 mg by mouth 2 (two) times daily.  Marland Kitchen gabapentin (NEURONTIN) 300 MG capsule Take 300 mg by mouth at bedtime.  Marland Kitchen GABAPENTIN PO Take 300 mg by mouth. 1 at 1:00 pm for nerve/muscle pain in legs  . levothyroxine (SYNTHROID) 25 MCG tablet Take 1 tablet (25 mcg total) by mouth daily with breakfast.  . mometasone (ELOCON) 0.1 % cream Apply topically several times per week. Do not use daily consistently.  . mometasone (NASONEX) 50 MCG/ACT nasal spray PLACE 2 SPRAYS INTO THE NOSE DAILY  . montelukast (SINGULAIR) 10 MG tablet Take 1 tablet (10 mg total) by mouth at bedtime.  . mupirocin ointment (BACTROBAN) 2 % ADD ONE INCH OF OINTMENT TO 8 OUNCES OF SINUS RINSE BOTTLE DAILY  . NON FORMULARY Salon pas pain patches/ PRN  . PHENobarbital (LUMINAL) 32.4 MG tablet TAKE 1 TABLET BY MOUTH DAILY AT 6AM AND 2 TABLETS AT 8PM  . polyethylene glycol powder (GLYCOLAX/MIRALAX) powder Take 3/4 capful dissolved in at least 8 ounces water/juice once daily. May titrate as needed  . predniSONE (DELTASONE) 20 MG tablet Take  1 tablet (20 mg total) by mouth daily.  . sodium chloride (OCEAN) 0.65 % nasal spray Place into the nose.  . TEGRETOL-XR 100 MG 12 hr tablet Take 500 mg by mouth daily.   . VENTOLIN HFA 108 (90 Base) MCG/ACT inhaler SMARTSIG:1 Puff(s) Via Inhaler Every 4 Hours PRN  . Vitamin D, Ergocalciferol, (DRISDOL) 1.25 MG (50000 UT) CAPS capsule Take 1 capsule (50,000 Units total) by mouth every 7 (seven) days.  . [DISCONTINUED] dextroamphetamine (DEXTROSTAT) 10 MG tablet TAKE ONE TABLET BY MOUTH EVERY MORNING AT NOON  IN THE EVENING  AND AT BEDTIME   No facility-administered encounter medications on file as of 12/03/2019.    Allergies  (verified) Iodinated diagnostic agents, Iodine, Morphine, Morphine and related, and Metrizamide   History: Past Medical History:  Diagnosis Date  . Allergic rhinitis   . Anemia   . Arthritis   . Asthma -COPD   . Blood transfusion 1957  . Breast mass    right breast in milk duct, bx neg  . Chronic fatigue syndrome   . Chronic inflammatory demyelinating polyneuropathy ([place]) 03/2011  . chronic sinusitis 08/08/2012  . Colon polyp   . Constipation   . COPD (chronic obstructive pulmonary disease) ([employer])   . Cystocele 09/16/2012  . GERD (gastroesophageal reflux disease)   . Hyperlipidemia   . Hypertension   . Hypothyroid   . Neurogenic bladder 2013   husband caths pt, manages with timed void  . Osteoporosis   . Pulmonary embolism ([place])    1980s  . Renal cyst    Non-complex, contrast MRI 2013 with L>R non-complex cysts, dominant 3.7 left lower pole  . Seizures ([place])    1990 last seizures on meds Phenobarb  . Shingles late 33's  . Skin cancer    squamous cell  . Thyroid nodule    Right thyroid lobe, only seen on Sagittal imaging measures 2.3 cm in craniocaudal dimension and appears stable  . Tubular adenoma   . Vitamin D deficiency    Past Surgical History:  Procedure Laterality Date  . ABDOMINAL HYSTERECTOMY    . ANTERIOR FUSION CLIVUS-C2 EXTRAORAL W/ ODONTOID EXCISION  8/14  . APPENDECTOMY    . BASAL CELL CARCINOMA EXCISION     face  . BLADDER SUSPENSION    . BREAST DUCTAL SYSTEM EXCISION Right 01/15/2014   Procedure: EXCISION DUCTAL SYSTEM RIGHT BREAST;  Surgeon: Stark Klein, MD;  Location: [place];  Service: General;  Laterality: Right;  . BREAST EXCISIONAL BIOPSY Right   . CARPAL TUNNEL RELEASE     right  . CATARACT EXTRACTION     bilateral  . cervical neck ablation     x 7, C3-C6/3 screws and plate  . CESAREAN SECTION    . CYSTOCELE REPAIR    . DILATION AND CURETTAGE OF UTERUS    . duptyren's contracture right hand    . FINGER ARTHRODESIS  Right 05/06/2015   Procedure: RIGHT RING PROXIMAL INTERPHALANGEAL FUSION (PIP);  Surgeon: Leanora Cover, MD;  Location: [place];  Service: Orthopedics;  Laterality: Right;  . FINGER GANGLION CYST EXCISION     right  . HEMICOLECTOMY Right   . JOINT REPLACEMENT     right and left basal joints of thumbs  . left finger fusion     3 fingers on left hand/one finger right  . left ovary and tube removed    . NASAL POLYP SURGERY     4 sinus surgeries  . NASAL SEPTUM  SURGERY    . PANNICULECTOMY    . PROXIMAL INTERPHALANGEAL FUSION (PIP) Left 09/09/2013   Procedure: FUSION LEFT INDEX PROXIMAL INTERPHALANGEAL JOINT (PIP);  Surgeon: Cammie Sickle., MD;  [facility];  Service: Orthopedics;  Laterality: Left;  . right median nerve decompression    . SHOULDER ARTHROSCOPY     x2 left, 1 right  . sinus surgeries     x 4  . squamous lesions removed     neck and face  . STERIOD INJECTION Right 05/06/2015   Procedure: STEROID INJECTION;  Surgeon: Leanora Cover, MD;  Location: [place];  Service: Orthopedics;  Laterality: Right;  Right Index Finger Proximal InterPhalangeal Joint Injection  . TONSILLECTOMY AND ADENOIDECTOMY    . TUBAL LIGATION    . vocal polyps removed     Family History  Problem Relation Age of Onset  . Heart disease Mother   . Hyperlipidemia Mother        Jerilynn Mages, aunt  . Ovarian cancer Sister   . Lung cancer Sister        lung - stage 1  . Thyroid cancer Sister   . Colon cancer Father        77  . Malignant hyperthermia Cousin   . Stomach cancer Other        first cousin  . Skin cancer Daughter   . Breast cancer Neg Hx    Social History   Socioeconomic History  . Marital status: Married    Spouse name: Not on file  . Number of children: 3  . Years of education: Not on file  . Highest education level: Not on file  Occupational History  . Occupation: retired, Licensed conveyancer    Employer: RETIRED  Tobacco Use  .  Smoking status: Former Smoker    Packs/day: 0.50    Years: 3.00    Pack years: 1.50    Types: Cigarettes    Quit date: 05/29/1978    Years since quitting: 41.5  . Smokeless tobacco: Never Used  . Tobacco comment: quit smoking 35 years ago  Substance and Sexual Activity  . Alcohol use: No  . Drug use: No  . Sexual activity: Yes  Other Topics Concern  . Not on file  Social History Narrative   Pt lives at home with her husband. She is originally from [place]. Moved to [place] in 2005.     3 grown children - Son is a Pharmacist, community in the area, son in [place], daughter in [place]   Pt is a retired Marine scientist   Social Determinants of Radio broadcast assistant Strain: [place]   . Difficulty of Paying Living Expenses: Not hard at all  Food Insecurity: No Food Insecurity  . Worried About Charity fundraiser in the Last Year: Never true  . Ran Out of Food in the Last Year: Never true  Transportation Needs: No Transportation Needs  . Lack of Transportation (Medical): No  . Lack of Transportation (Non-Medical): No  Physical Activity: Insufficiently Active  . Days of Exercise per Week: 2 days  . Minutes of Exercise per Session: 10 min  Stress: No Stress Concern Present  . Feeling of Stress : Not at all  Social Connections: Moderately Integrated  . Frequency of Communication with Friends and Family: More than three times a week  . Frequency of Social Gatherings with Friends and Family: Once a week  . Attends Religious Services: Never  . Active  Member of Clubs or Organizations: Yes  . Attends Archivist Meetings: 1 to 4 times per year  . Marital Status: Married    Tobacco Counseling Counseling given: Not Answered Comment: quit smoking 35 years ago   Clinical Intake:  Pre-visit preparation completed: Yes  Pain : No/denies pain     Nutritional Status: BMI of 19-24  Normal Nutritional Risks: None Diabetes: No  How often do you need to have someone help you when you read  instructions, pamphlets, or other written materials from your doctor or pharmacy?: 1 - Never  Diabetic?No   Interpreter Needed?: No  Information entered by :: Caroleen Hamman LPN   Activities of Daily Living In your present state of health, do you have any difficulty performing the following activities: 12/03/2019  Hearing? Y  Vision? N  Difficulty concentrating or making decisions? N  Walking or climbing stairs? Y  Comment uses rollator  Dressing or bathing? Y  Comment husband assist  Doing errands, shopping? N  Preparing Food and eating ? N  Using the Toilet? N  In the past six months, have you accidently leaked urine? Y  Comment wears depends  Do you have problems with loss of bowel control? N  Managing your Medications? N  Managing your Finances? N  Housekeeping or managing your Housekeeping? N  Some recent data might be hidden    Patient Care Team: Ronnald Nian, DO as PCP - General (Family Medicine) Pyrtle, Lajuan Lines, MD as Consulting Physician (Gastroenterology) Maryellen Pile, MD as Consulting Physician (Neurosurgery) Alexis Frock, MD as Consulting Physician (Urology) Druscilla Brownie, MD as Consulting Physician (Dermatology) Roseanne Kaufman, MD as Consulting Physician (Orthopedic Surgery) Stark Klein, MD as Consulting Physician (General Surgery) Gwenevere Ghazi, MD as Consulting Physician (Pulmonary Disease) Philemon Kingdom, MD as Consulting Physician (Endocrinology) Leanora Cover, MD as Consulting Physician (Orthopedic Surgery)  Indicate any recent [place] you may have received from other than [facility] providers in the past year (date may be approximate).     Assessment:   This is a routine wellness examination for [place].  Hearing/Vision screen  Hearing Screening   125Hz  250Hz  500Hz  1000Hz  2000Hz  3000Hz  4000Hz  6000Hz  8000Hz   Right ear:           Left ear:           Comments: Hearing loss in left ear  Vision Screening Comments: Has an eye  appt 12/04/19-Dr Posey Pronto  Dietary issues and exercise activities discussed: Current Exercise Habits: Home exercise routine, Type of exercise: walking, Time (Minutes): 10, Frequency (Times/Week): 2, Weekly Exercise (Minutes/Week): 20, Intensity: Mild, Exercise limited by: neurologic condition(s)  Goals    . Patient Stated     Continue walking      Depression Screen PHQ 2/9 Scores 12/03/2019 10/31/2019 07/30/2019 09/15/2015 01/12/2015 09/11/2013  PHQ - 2 Score 0 0 0 0 0 0  PHQ- 9 Score 2 0 0 - - -    Fall Risk Fall Risk  12/03/2019 07/30/2019 12/18/2016 09/15/2015 01/12/2015  Falls in the past year? 1 1 Yes No No  Comment - - Emmi Telephone Survey: data to providers prior to load - -  Number falls in past yr: 1 1 2  or more - -  Comment - - Emmi Telephone Survey Actual Response = 2 - -  Injury with Fall? 1 1 No - -  Comment Laceration, abrasions Head injury - - -  Risk Factor Category  - - - - -  Risk for fall due to :  Impaired mobility;History of fall(s) - - - -  Follow up Falls prevention discussed - - - -  Comment - - - - -    Any stairs in or around the home? No  Home free of loose throw rugs in walkways, pet beds, electrical cords, etc? Yes  Adequate lighting in your home to reduce risk of falls? Yes   ASSISTIVE DEVICES UTILIZED TO PREVENT FALLS:  Life alert? No  Use of a cane, walker or w/c? Yes  Grab bars in the bathroom? Yes  Shower chair or bench in shower? Yes  Elevated toilet seat or a handicapped toilet? No   TIMED UP AND GO:  Was the test performed? Yes .  Length of time to ambulate 10 feet: 12 sec.   Gait slow and steady with assistive device  Cognitive Function:No cognitive impairment noted. Patie rreads daily        Immunizations Immunization History  Administered Date(s) Administered  . Fluad Quad(high Dose 65+) 03/13/2019  . Influenza Split 12/27/2012  . Influenza Whole 01/28/2011  . Influenza,inj,Quad PF,6+ Mos 03/16/2014, 02/17/2015  . Influenza-Unspecified  02/12/2012, 02/05/2015  . PFIZER SARS-COV-2 Vaccination 06/17/2019, 07/08/2019  . Pneumococcal Conjugate-13 12/11/2013  . Pneumococcal Polysaccharide-23 05/29/2006  . Tdap 04/11/2011, 10/14/2018  . Zoster 04/14/2014  . Zoster Recombinat (Shingrix) 05/01/2019, 07/30/2019    TDAP status: Up to date   Flu Vaccine status: Up to date   Pneumococcal vaccine status: Up to date   Covid-19 vaccine status: Completed vaccines  Qualifies for Shingles Vaccine? Zostavax completed Yes   Shingrix Completed?: Yes  Screening Tests Health Maintenance  Topic Date Due  . INFLUENZA VACCINE  12/28/2019  . TETANUS/TDAP  10/13/2028  . DEXA SCAN  Completed  . COVID-19 Vaccine  Completed  . PNA vac Low Risk Adult  Completed    Health Maintenance  There are no preventive care reminders to display for this patient.  Colorectal cancer screening: No longer required.    Mammogram status: Completed 02/17/2019. Repeat every year   Bone Density status: Completed 08/11/2014. Results reflect: Bone density results: OSTEOPENIA. Repeat every 2 years. Ordered today  Lung Cancer Screening: (Low Dose CT Chest recommended if Age 27-80 years, 30 pack-year currently smoking OR have quit w/in 15years.) does not qualify.     Additional Screening:  Hepatitis C Screening: does not qualify  Vision Screening: Recommended annual ophthalmology exams for early detection of glaucoma and other disorders of the eye. Is the patient up to date with their annual eye exam?  Yes  Who is the provider or what is the name of the office in which the patient attends annual eye exams? Dr. Posey Pronto   Dental Screening: Recommended annual dental exams for proper oral hygiene  Community Resource Referral / Chronic Care Management: CRR required this visit?  No   CCM required this visit?  No      Plan:     I have personally reviewed and noted the following in the patient's chart:   . Medical and social history . Use of  alcohol, tobacco or illicit drugs  . Current medications and supplements . Functional ability and status . Nutritional status . Physical activity . Advanced directives . List of other physicians . Hospitalizations, surgeries, and ER visits in previous 12 months . Vitals . Screenings to include cognitive, depression, and falls . Referrals and appointments  In addition, I have reviewed and discussed with patient certain preventive protocols, quality metrics, and best practice recommendations. A written personalized care  plan for preventive services as well as general preventive health recommendations were provided to patient.  Patient to access AVS via mychart.     Marta Antu, LPN   01/05/227  Nurse Health Advisor  Nurse Notes: None

## 2019-12-03 ENCOUNTER — Ambulatory Visit: Payer: Medicare Other | Admitting: *Deleted

## 2019-12-03 ENCOUNTER — Ambulatory Visit (INDEPENDENT_AMBULATORY_CARE_PROVIDER_SITE_OTHER): Payer: Medicare Other

## 2019-12-03 ENCOUNTER — Other Ambulatory Visit: Payer: Self-pay

## 2019-12-03 VITALS — BP 118/64 | HR 88 | Temp 98.0°F | Resp 16 | Ht 63.0 in | Wt 122.6 lb

## 2019-12-03 DIAGNOSIS — Z780 Asymptomatic menopausal state: Secondary | ICD-10-CM | POA: Diagnosis not present

## 2019-12-03 DIAGNOSIS — G4710 Hypersomnia, unspecified: Secondary | ICD-10-CM

## 2019-12-03 DIAGNOSIS — Z0000 Encounter for general adult medical examination without abnormal findings: Secondary | ICD-10-CM

## 2019-12-03 MED ORDER — DEXTROAMPHETAMINE SULFATE 10 MG PO TABS: ORAL_TABLET | ORAL | 0 refills | Status: DC

## 2019-12-03 NOTE — Telephone Encounter (Signed)
Last OV 10/31/19 Last fill 10/29/19 #120/0

## 2019-12-03 NOTE — Patient Instructions (Signed)
[name] , Thank you for taking time to come for your Medicare Wellness Visit. I appreciate your ongoing commitment to your health goals. Please review the following plan we discussed and let me know if I can assist you in the future.   Screening recommendations/referrals: Colonoscopy: Completed 02/03/2014-No longer indicated Mammogram: Completed 02/17/2019-Due 02/17/2020 Bone Density: Ordered today-Someone will be calling you to schedule appointment Recommended yearly ophthalmology/optometry visit for glaucoma screening and checkup Recommended yearly dental visit for hygiene and checkup  Vaccinations: Influenza vaccine: Due 01/2020 Pneumococcal vaccine: Completed vaccines Tdap vaccine: Up to date-Next due-10/13/2028 Shingles vaccine: Completed vaccines   Covid-19:Completed vaccines  Advanced directives: Please bring a copy to next office visit if available  Conditions/risks identified: See problem list  Next appointment: Follow up in one year for your annual wellness visit 12/08/2020 @ 11:15   Preventive Care 84 Years and Older, Female Preventive care refers to lifestyle choices and visits with your health care provider that can promote health and wellness. What does preventive care include?  A yearly physical exam. This is also called an annual well check.  Dental exams once or twice a year.  Routine eye exams. Ask your health care provider how often you should have your eyes checked.  Personal lifestyle choices, including:  Daily care of your teeth and gums.  Regular physical activity.  Eating a healthy diet.  Avoiding tobacco and drug use.  Limiting alcohol use.  Practicing safe sex.  Taking low-dose aspirin every day.  Taking vitamin and mineral supplements as recommended by your health care provider. What happens during an annual well check? The services and screenings done by your health care provider during your annual well check will depend on your age, overall  health, lifestyle risk factors, and family history of disease. Counseling  Your health care provider may ask you questions about your:  Alcohol use.  Tobacco use.  Drug use.  Emotional well-being.  Home and relationship well-being.  Sexual activity.  Eating habits.  History of falls.  Memory and ability to understand (cognition).  Work and work Statistician.  Reproductive health. Screening  You may have the following tests or measurements:  Height, weight, and BMI.  Blood pressure.  Lipid and cholesterol levels. These may be checked every 5 years, or more frequently if you are over 13 years old.  Skin check.  Lung cancer screening. You may have this screening every year starting at age 31 if you have a 30-pack-year history of smoking and currently smoke or have quit within the past 15 years.  Fecal occult blood test (FOBT) of the stool. You may have this test every year starting at age 20.  Flexible sigmoidoscopy or colonoscopy. You may have a sigmoidoscopy every 5 years or a colonoscopy every 10 years starting at age 30.  Hepatitis C blood test.  Hepatitis B blood test.  Sexually transmitted disease (STD) testing.  Diabetes screening. This is done by checking your blood sugar (glucose) after you have not eaten for a while (fasting). You may have this done every 1-3 years.  Bone density scan. This is done to screen for osteoporosis. You may have this done starting at age 53.  Mammogram. This may be done every 1-2 years. Talk to your health care provider about how often you should have regular mammograms. Talk with your health care provider about your test results, treatment options, and if necessary, the need for more tests. Vaccines  Your health care provider may recommend certain vaccines, such  as:  Influenza vaccine. This is recommended every year.  Tetanus, diphtheria, and acellular pertussis (Tdap, Td) vaccine. You may need a Td booster every 10  years.  Zoster vaccine. You may need this after age 46.  Pneumococcal 13-valent conjugate (PCV13) vaccine. One dose is recommended after age 7.  Pneumococcal polysaccharide (PPSV23) vaccine. One dose is recommended after age 49. Talk to your health care provider about which screenings and vaccines you need and how often you need them. This information is not intended to replace advice given to you by your health care provider. Make sure you discuss any questions you have with your health care provider. Document Released: 06/11/2015 Document Revised: 02/02/2016 Document Reviewed: 03/16/2015 Elsevier Interactive Patient Education  2017 [place] Prevention in the Home Falls can cause injuries. They can happen to people of all ages. There are many things you can do to make your home safe and to help prevent falls. What can I do on the outside of my home?  Regularly fix the edges of walkways and driveways and fix any cracks.  Remove anything that might make you trip as you walk through a door, such as a raised step or threshold.  Trim any bushes or trees on the path to your home.  Use bright outdoor lighting.  Clear any walking paths of anything that might make someone trip, such as rocks or tools.  Regularly check to see if handrails are loose or broken. Make sure that both sides of any steps have handrails.  Any raised decks and porches should have guardrails on the edges.  Have any leaves, snow, or ice cleared regularly.  Use sand or salt on walking paths during winter.  Clean up any spills in your garage right away. This includes oil or grease spills. What can I do in the bathroom?  Use night lights.  Install grab bars by the toilet and in the tub and shower. Do not use towel bars as grab bars.  Use non-skid mats or decals in the tub or shower.  If you need to sit down in the shower, use a plastic, non-slip stool.  Keep the floor dry. Clean up any water that  spills on the floor as soon as it happens.  Remove soap buildup in the tub or shower regularly.  Attach bath mats securely with double-sided non-slip rug tape.  Do not have throw rugs and other things on the floor that can make you trip. What can I do in the bedroom?  Use night lights.  Make sure that you have a light by your bed that is easy to reach.  Do not use any sheets or blankets that are too big for your bed. They should not hang down onto the floor.  Have a firm chair that has side arms. You can use this for support while you get dressed.  Do not have throw rugs and other things on the floor that can make you trip. What can I do in the kitchen?  Clean up any spills right away.  Avoid walking on wet floors.  Keep items that you use a lot in easy-to-reach places.  If you need to reach something above you, use a strong step stool that has a grab bar.  Keep electrical cords out of the way.  Do not use floor polish or wax that makes floors slippery. If you must use wax, use non-skid floor wax.  Do not have throw rugs and other things on the  floor that can make you trip. What can I do with my stairs?  Do not leave any items on the stairs.  Make sure that there are handrails on both sides of the stairs and use them. Fix handrails that are broken or loose. Make sure that handrails are as long as the stairways.  Check any carpeting to make sure that it is firmly attached to the stairs. Fix any carpet that is loose or worn.  Avoid having throw rugs at the top or bottom of the stairs. If you do have throw rugs, attach them to the floor with carpet tape.  Make sure that you have a light switch at the top of the stairs and the bottom of the stairs. If you do not have them, ask someone to add them for you. What else can I do to help prevent falls?  Wear shoes that:  Do not have high heels.  Have rubber bottoms.  Are comfortable and fit you well.  Are closed at the  toe. Do not wear sandals.  If you use a stepladder:  Make sure that it is fully opened. Do not climb a closed stepladder.  Make sure that both sides of the stepladder are locked into place.  Ask someone to hold it for you, if possible.  Clearly mark and make sure that you can see:  Any grab bars or handrails.  First and last steps.  Where the edge of each step is.  Use tools that help you move around (mobility aids) if they are needed. These include:  Canes.  Walkers.  Scooters.  Crutches.  Turn on the lights when you go into a dark area. Replace any light bulbs as soon as they burn out.  Set up your furniture so you have a clear path. Avoid moving your furniture around.  If any of your floors are uneven, fix them.  If there are any pets around you, be aware of where they are.  Review your medicines with your doctor. Some medicines can make you feel dizzy. This can increase your chance of falling. Ask your doctor what other things that you can do to help prevent falls. This information is not intended to replace advice given to you by your health care provider. Make sure you discuss any questions you have with your health care provider. Document Released: 03/11/2009 Document Revised: 10/21/2015 Document Reviewed: 06/19/2014 Elsevier Interactive Patient Education  2017 [employer].

## 2019-12-03 NOTE — Telephone Encounter (Signed)
Order filled today.

## 2019-12-04 DIAGNOSIS — H04123 Dry eye syndrome of bilateral lacrimal glands: Secondary | ICD-10-CM | POA: Diagnosis not present

## 2019-12-04 DIAGNOSIS — H524 Presbyopia: Secondary | ICD-10-CM | POA: Diagnosis not present

## 2019-12-04 DIAGNOSIS — H10413 Chronic giant papillary conjunctivitis, bilateral: Secondary | ICD-10-CM | POA: Diagnosis not present

## 2019-12-04 DIAGNOSIS — H52223 Regular astigmatism, bilateral: Secondary | ICD-10-CM | POA: Diagnosis not present

## 2019-12-04 DIAGNOSIS — H40013 Open angle with borderline findings, low risk, bilateral: Secondary | ICD-10-CM | POA: Diagnosis not present

## 2019-12-05 NOTE — Progress Notes (Signed)
I have collaborated with the care management provider regarding care management and care coordination activities outlined in this encounter and have reviewed this encounter including documentation in the note and care plan. I am certifying that I agree with the content of this note and encounter as supervising physician.  

## 2019-12-09 ENCOUNTER — Other Ambulatory Visit: Payer: Self-pay | Admitting: Family Medicine

## 2019-12-09 DIAGNOSIS — Z1231 Encounter for screening mammogram for malignant neoplasm of breast: Secondary | ICD-10-CM

## 2019-12-17 ENCOUNTER — Other Ambulatory Visit: Payer: Self-pay | Admitting: Family Medicine

## 2019-12-19 NOTE — Telephone Encounter (Signed)
Last OV 10/31/19 Last fill 09/29/15  #90/2

## 2019-12-29 DIAGNOSIS — J449 Chronic obstructive pulmonary disease, unspecified: Secondary | ICD-10-CM | POA: Diagnosis not present

## 2019-12-29 DIAGNOSIS — R0602 Shortness of breath: Secondary | ICD-10-CM | POA: Diagnosis not present

## 2019-12-29 DIAGNOSIS — Z1152 Encounter for screening for COVID-19: Secondary | ICD-10-CM | POA: Diagnosis not present

## 2020-01-05 ENCOUNTER — Other Ambulatory Visit: Payer: Self-pay | Admitting: Family Medicine

## 2020-01-05 DIAGNOSIS — G4710 Hypersomnia, unspecified: Secondary | ICD-10-CM

## 2020-01-05 NOTE — Telephone Encounter (Signed)
Last OV 10/31/19 Last 12/03/19 #120/0

## 2020-01-10 DIAGNOSIS — R05 Cough: Secondary | ICD-10-CM | POA: Diagnosis not present

## 2020-01-12 DIAGNOSIS — J449 Chronic obstructive pulmonary disease, unspecified: Secondary | ICD-10-CM | POA: Diagnosis not present

## 2020-01-12 DIAGNOSIS — J45909 Unspecified asthma, uncomplicated: Secondary | ICD-10-CM | POA: Diagnosis not present

## 2020-01-15 DIAGNOSIS — M21371 Foot drop, right foot: Secondary | ICD-10-CM | POA: Diagnosis not present

## 2020-01-15 DIAGNOSIS — R252 Cramp and spasm: Secondary | ICD-10-CM | POA: Diagnosis not present

## 2020-01-15 DIAGNOSIS — R269 Unspecified abnormalities of gait and mobility: Secondary | ICD-10-CM | POA: Diagnosis not present

## 2020-01-15 DIAGNOSIS — M4802 Spinal stenosis, cervical region: Secondary | ICD-10-CM | POA: Diagnosis not present

## 2020-01-15 DIAGNOSIS — M21372 Foot drop, left foot: Secondary | ICD-10-CM | POA: Diagnosis not present

## 2020-01-20 ENCOUNTER — Telehealth: Payer: Self-pay

## 2020-01-20 DIAGNOSIS — R7989 Other specified abnormal findings of blood chemistry: Secondary | ICD-10-CM

## 2020-01-20 DIAGNOSIS — N202 Calculus of kidney with calculus of ureter: Secondary | ICD-10-CM | POA: Diagnosis not present

## 2020-01-20 DIAGNOSIS — N281 Cyst of kidney, acquired: Secondary | ICD-10-CM | POA: Diagnosis not present

## 2020-01-20 DIAGNOSIS — N319 Neuromuscular dysfunction of bladder, unspecified: Secondary | ICD-10-CM | POA: Diagnosis not present

## 2020-01-20 NOTE — Telephone Encounter (Signed)
Dr. Loletha Grayer please advise.  Allen from [facility] neuromuscular called to let us know the patient's Creatine level was elevated at 1.4 when they did it Thursday and they were just asking if you could do a repeat basic metabolic panel to make sure renal function is okay an not staying elevated.

## 2020-01-21 NOTE — Telephone Encounter (Signed)
Please have pt schedule lab appt (does not need to be fasting) in 2 wks. Please encourage pt to drink plenty of water and stay hydrated

## 2020-01-21 NOTE — Telephone Encounter (Signed)
Pt was notified and lab appointment was made for 02/05/2020. Pt verbally understood to stay well hydrated as well.

## 2020-01-27 DIAGNOSIS — L940 Localized scleroderma [morphea]: Secondary | ICD-10-CM | POA: Diagnosis not present

## 2020-01-27 DIAGNOSIS — D2260 Melanocytic nevi of unspecified upper limb, including shoulder: Secondary | ICD-10-CM | POA: Diagnosis not present

## 2020-01-27 DIAGNOSIS — D2270 Melanocytic nevi of unspecified lower limb, including hip: Secondary | ICD-10-CM | POA: Diagnosis not present

## 2020-01-27 DIAGNOSIS — L578 Other skin changes due to chronic exposure to nonionizing radiation: Secondary | ICD-10-CM | POA: Diagnosis not present

## 2020-01-27 DIAGNOSIS — L570 Actinic keratosis: Secondary | ICD-10-CM | POA: Diagnosis not present

## 2020-01-27 DIAGNOSIS — L814 Other melanin hyperpigmentation: Secondary | ICD-10-CM | POA: Diagnosis not present

## 2020-01-27 DIAGNOSIS — L821 Other seborrheic keratosis: Secondary | ICD-10-CM | POA: Diagnosis not present

## 2020-01-27 DIAGNOSIS — Z85828 Personal history of other malignant neoplasm of skin: Secondary | ICD-10-CM | POA: Diagnosis not present

## 2020-01-27 DIAGNOSIS — D225 Melanocytic nevi of trunk: Secondary | ICD-10-CM | POA: Diagnosis not present

## 2020-02-04 ENCOUNTER — Other Ambulatory Visit: Payer: Self-pay

## 2020-02-05 ENCOUNTER — Other Ambulatory Visit (INDEPENDENT_AMBULATORY_CARE_PROVIDER_SITE_OTHER): Payer: Medicare Other

## 2020-02-05 DIAGNOSIS — R7989 Other specified abnormal findings of blood chemistry: Secondary | ICD-10-CM

## 2020-02-05 LAB — BASIC METABOLIC PANEL
BUN: 32 mg/dL — ABNORMAL HIGH (ref 6–23)
CO2: 27 mEq/L (ref 19–32)
Calcium: 8.8 mg/dL (ref 8.4–10.5)
Chloride: 103 mEq/L (ref 96–112)
Creatinine, Ser: 0.87 mg/dL (ref 0.40–1.20)
GFR: 61.82 mL/min (ref >60.00)
Glucose, Bld: 85 mg/dL (ref 70–99)
Potassium: 4.3 mEq/L (ref 3.5–5.1)
Sodium: 136 mEq/L (ref 135–145)

## 2020-02-06 ENCOUNTER — Other Ambulatory Visit: Payer: Self-pay | Admitting: Family Medicine

## 2020-02-06 DIAGNOSIS — G4710 Hypersomnia, unspecified: Secondary | ICD-10-CM

## 2020-02-06 NOTE — Telephone Encounter (Signed)
Received a refill request for:  Dextrostat 10mg  1 tab QID, #120 LR 01/07/20, 0 RF's LOV 10/31/19 (Dr C) FOV 05/14/20 (Dr C)  Please advise.  Thanks,  [employer]

## 2020-02-06 NOTE — Telephone Encounter (Signed)
Medication was ordered for her one month ago.

## 2020-02-06 NOTE — Telephone Encounter (Signed)
Patient notified VIA phone and had already picked up the RX. Dm/cma

## 2020-02-10 DIAGNOSIS — G6289 Other specified polyneuropathies: Secondary | ICD-10-CM | POA: Diagnosis not present

## 2020-02-10 DIAGNOSIS — M5416 Radiculopathy, lumbar region: Secondary | ICD-10-CM | POA: Diagnosis not present

## 2020-02-11 DIAGNOSIS — G6289 Other specified polyneuropathies: Secondary | ICD-10-CM | POA: Diagnosis not present

## 2020-02-11 DIAGNOSIS — M5416 Radiculopathy, lumbar region: Secondary | ICD-10-CM | POA: Diagnosis not present

## 2020-02-12 ENCOUNTER — Encounter: Payer: Self-pay | Admitting: Family Medicine

## 2020-02-16 DIAGNOSIS — M48061 Spinal stenosis, lumbar region without neurogenic claudication: Secondary | ICD-10-CM | POA: Diagnosis not present

## 2020-02-16 DIAGNOSIS — M5416 Radiculopathy, lumbar region: Secondary | ICD-10-CM | POA: Diagnosis not present

## 2020-02-16 DIAGNOSIS — M4726 Other spondylosis with radiculopathy, lumbar region: Secondary | ICD-10-CM | POA: Diagnosis not present

## 2020-02-26 ENCOUNTER — Other Ambulatory Visit: Payer: Self-pay | Admitting: Family Medicine

## 2020-02-26 DIAGNOSIS — R569 Unspecified convulsions: Secondary | ICD-10-CM

## 2020-02-26 NOTE — Telephone Encounter (Signed)
patient notified VIA phone that Rx was sent to the pharmacy.  No further questions.  Dm/cma

## 2020-02-26 NOTE — Telephone Encounter (Signed)
Received a refill request for:  Phenobarbital 32.4 mg LR 07/30/19, #180, 3 rf's LOV 10/31/19  FOV 05/05/20  Patient states that she only has 3 tabs left at home. Pharmacy states that since it is a controlled substance after 6 months the Rx expires. Please review and advise.  Thanks.  Dm/cma

## 2020-03-02 DIAGNOSIS — Z23 Encounter for immunization: Secondary | ICD-10-CM | POA: Diagnosis not present

## 2020-03-04 ENCOUNTER — Other Ambulatory Visit: Payer: Self-pay

## 2020-03-04 ENCOUNTER — Other Ambulatory Visit: Payer: Self-pay | Admitting: Family Medicine

## 2020-03-04 ENCOUNTER — Ambulatory Visit
Admission: RE | Admit: 2020-03-04 | Discharge: 2020-03-04 | Disposition: A | Discharge status: Home / Self Care | Payer: Medicare Other | Source: Ambulatory Visit | Attending: Family Medicine | Admitting: Family Medicine

## 2020-03-04 DIAGNOSIS — Z780 Asymptomatic menopausal state: Secondary | ICD-10-CM | POA: Diagnosis not present

## 2020-03-04 DIAGNOSIS — Z1231 Encounter for screening mammogram for malignant neoplasm of breast: Secondary | ICD-10-CM

## 2020-03-04 DIAGNOSIS — M8589 Other specified disorders of bone density and structure, multiple sites: Secondary | ICD-10-CM | POA: Diagnosis not present

## 2020-03-07 ENCOUNTER — Other Ambulatory Visit: Payer: Self-pay | Admitting: Family Medicine

## 2020-03-07 DIAGNOSIS — G4710 Hypersomnia, unspecified: Secondary | ICD-10-CM

## 2020-03-16 DIAGNOSIS — R29898 Other symptoms and signs involving the musculoskeletal system: Secondary | ICD-10-CM | POA: Diagnosis not present

## 2020-03-25 DIAGNOSIS — R29898 Other symptoms and signs involving the musculoskeletal system: Secondary | ICD-10-CM | POA: Diagnosis not present

## 2020-03-25 DIAGNOSIS — M47812 Spondylosis without myelopathy or radiculopathy, cervical region: Secondary | ICD-10-CM | POA: Diagnosis not present

## 2020-03-27 ENCOUNTER — Encounter: Payer: Self-pay | Admitting: Family Medicine

## 2020-03-29 ENCOUNTER — Telehealth: Payer: Self-pay

## 2020-03-29 NOTE — Telephone Encounter (Signed)
Please advise on message below from patient.  I did advise her that you weren't in the office till Wednesday.   Thanks.  Dm/cma   ----- Message -----      [name]      Sent:03/27/2020  2:06 PM EDT        KA:JGOT Cirigliano, DO   Subject:Thyroid nodule  I had MRI of cervical and thoracic spine Thursday at [facility].  [name], PA, Dr.Oren Gottfried's PA, sent me a note that on the thoracic MRI, they noted heterogenous  enlargement of the right thyroid gland; if not previously checked, consider dedicated ultrasound of the thyroid.    Dr. Bryan Lemma,  Dr. Kathlene November saw this on a previously [facility] MRI in 2016.  He and I talked about this, he ordered an ultrasound  which confirmed  that I had bilateral thyroid nodules-dominant bilateral lesions.   On 11-25-2014,  I had on both the left and right thyroid ultrasound guided needle aspirate biopsies of both thyroids.  The left measured 34 x 13 x 12 mm, solitary 17 x 10 x 12 mm solid nodule, mid lobe.  The right thyroid lobe measured 33 x 18 x 17 mm, dominant 23 x 17 x 16mm solid nodule, mid lobe, medially in adjacent 11 x 8 x 5 mm solid nodule.   Dr. Larose Kells on 11-24-2014 advised me both biopsies were  benign; these biopsies and results were done by Dr. Greggory Keen.   At the time, Dr. Larose Kells of [place] was my primary care doctor.  Unless you feel more needs to be done, I will tell [facility] what I have told you.  Many thanks, [name], DOB 02/27/1933. T:  [phone]

## 2020-03-30 NOTE — Telephone Encounter (Signed)
Pt husband dropped off and I put in Dr Vivia Ewing folder up front

## 2020-03-30 NOTE — Telephone Encounter (Signed)
Results on your desk.  Dm/cma  

## 2020-03-31 NOTE — Telephone Encounter (Signed)
Please call pt to let her know that I do not feel anything more needs to be done. Thank you for the information.

## 2020-03-31 NOTE — Telephone Encounter (Signed)
FYI:  Patient reports having the palms of her hands going numb but no pain..  Notified her of your recommendations and she says thank you.  Dm/cma

## 2020-04-09 ENCOUNTER — Other Ambulatory Visit: Payer: Self-pay | Admitting: Family Medicine

## 2020-04-09 DIAGNOSIS — G4710 Hypersomnia, unspecified: Secondary | ICD-10-CM

## 2020-04-09 NOTE — Telephone Encounter (Signed)
Last OV 10/31/19 Last fill 03/08/20  #120/0

## 2020-04-15 ENCOUNTER — Ambulatory Visit: Payer: Medicare Other

## 2020-04-29 DIAGNOSIS — R0602 Shortness of breath: Secondary | ICD-10-CM | POA: Diagnosis not present

## 2020-04-29 DIAGNOSIS — J449 Chronic obstructive pulmonary disease, unspecified: Secondary | ICD-10-CM | POA: Diagnosis not present

## 2020-05-04 ENCOUNTER — Other Ambulatory Visit: Payer: Self-pay

## 2020-05-05 ENCOUNTER — Ambulatory Visit (INDEPENDENT_AMBULATORY_CARE_PROVIDER_SITE_OTHER): Payer: Medicare Other | Admitting: Family Medicine

## 2020-05-05 ENCOUNTER — Encounter: Payer: Self-pay | Admitting: Family Medicine

## 2020-05-05 VITALS — BP 118/70 | HR 86 | Temp 97.7°F | Ht 63.0 in | Wt 124.4 lb

## 2020-05-05 DIAGNOSIS — E039 Hypothyroidism, unspecified: Secondary | ICD-10-CM

## 2020-05-05 DIAGNOSIS — R569 Unspecified convulsions: Secondary | ICD-10-CM

## 2020-05-05 DIAGNOSIS — G4710 Hypersomnia, unspecified: Secondary | ICD-10-CM

## 2020-05-05 DIAGNOSIS — G6181 Chronic inflammatory demyelinating polyneuritis: Secondary | ICD-10-CM

## 2020-05-05 DIAGNOSIS — M810 Age-related osteoporosis without current pathological fracture: Secondary | ICD-10-CM | POA: Diagnosis not present

## 2020-05-05 DIAGNOSIS — I10 Essential (primary) hypertension: Secondary | ICD-10-CM

## 2020-05-05 LAB — LIPID PANEL
Cholesterol: 208 mg/dL — ABNORMAL HIGH (ref 0–200)
HDL: 77 mg/dL (ref >39.00)
LDL Cholesterol: 116 mg/dL — ABNORMAL HIGH (ref 0–99)
NonHDL: 130.72
Total CHOL/HDL Ratio: 3
Triglycerides: 76 mg/dL (ref 0.0–149.0)
VLDL: 15.2 mg/dL (ref 0.0–40.0)

## 2020-05-05 LAB — TSH: TSH: 1.63 u[IU]/mL (ref 0.35–4.50)

## 2020-05-05 LAB — ALT: ALT: 16 U/L (ref 0–35)

## 2020-05-05 LAB — AST: AST: 26 U/L (ref 0–37)

## 2020-05-05 LAB — T4, FREE: Free T4: 0.55 ng/dL — ABNORMAL LOW (ref 0.60–1.60)

## 2020-05-05 MED ORDER — DEXTROAMPHETAMINE SULFATE 10 MG PO TABS: 10.0000 mg | ORAL_TABLET | Freq: Four times a day (QID) | ORAL | 0 refills | Status: DC

## 2020-05-05 NOTE — Progress Notes (Signed)
[name] is a 86 y.o. female  Chief Complaint  Patient presents with  . Follow-up    6 month f/u chronic issues and meds.       HPI: *[name] is a 86 y.o. female seen today for routine f/u on chronic medical issues. These include HTN, HLD, hypothyroidism, chronic inflammatory demyelinating polyneuropathy, seizure disorder, chronic fatigue. She is accompanied by her husband.  She follows neurology at [facility]. Her next appt is next week. She is due for labs today. She has no acute issues or concerns. She still struggles with balance and gait as well as numbness/neuropathy. This is unchanged. She is able to ambulate with rollator walker.  Past Medical History:  Diagnosis Date  . Allergic rhinitis   . Anemia   . Arthritis   . Asthma -COPD   . Blood transfusion 1957  . Breast mass    right breast in milk duct, bx neg  . Chronic fatigue syndrome   . Chronic inflammatory demyelinating polyneuropathy ([place]) 03/2011  . chronic sinusitis 08/08/2012  . Colon polyp   . Constipation   . COPD (chronic obstructive pulmonary disease) ([place])   . Cystocele 09/16/2012  . GERD (gastroesophageal reflux disease)   . Hyperlipidemia   . Hypertension   . Hypothyroid   . Neurogenic bladder 2013   husband caths pt, manages with timed void  . Osteoporosis   . Pulmonary embolism ([employer])    1980s  . Renal cyst    Non-complex, contrast MRI 2013 with L>R non-complex cysts, dominant 3.7 left lower pole  . Seizures ([place])    1990 last seizures on meds Phenobarb  . Shingles late 24's  . Skin cancer    squamous cell  . Thyroid nodule    Right thyroid lobe, only seen on Sagittal imaging measures 2.3 cm in craniocaudal dimension and appears stable  . Tubular adenoma   . Vitamin D deficiency     Past Surgical History:  Procedure Laterality Date  . ABDOMINAL HYSTERECTOMY    . ANTERIOR FUSION CLIVUS-C2 EXTRAORAL W/ ODONTOID EXCISION  8/14  . APPENDECTOMY    . BASAL CELL CARCINOMA  EXCISION     face  . BLADDER SUSPENSION    . BREAST DUCTAL SYSTEM EXCISION Right 01/15/2014   Procedure: EXCISION DUCTAL SYSTEM RIGHT BREAST;  Surgeon: Stark Klein, MD;  Location: [place];  Service: General;  Laterality: Right;  . BREAST EXCISIONAL BIOPSY Right   . CARPAL TUNNEL RELEASE     right  . CATARACT EXTRACTION     bilateral  . cervical neck ablation     x 7, C3-C6/3 screws and plate  . CESAREAN SECTION    . CYSTOCELE REPAIR    . DILATION AND CURETTAGE OF UTERUS    . duptyren's contracture right hand    . FINGER ARTHRODESIS Right 05/06/2015   Procedure: RIGHT RING PROXIMAL INTERPHALANGEAL FUSION (PIP);  Surgeon: Leanora Cover, MD;  Location: [place];  Service: Orthopedics;  Laterality: Right;  . FINGER GANGLION CYST EXCISION     right  . HEMICOLECTOMY Right   . JOINT REPLACEMENT     right and left basal joints of thumbs  . left finger fusion     3 fingers on left hand/one finger right  . left ovary and tube removed    . NASAL POLYP SURGERY     4 sinus surgeries  . NASAL SEPTUM SURGERY    . PANNICULECTOMY    . PROXIMAL  INTERPHALANGEAL FUSION (PIP) Left 09/09/2013   Procedure: FUSION LEFT INDEX PROXIMAL INTERPHALANGEAL JOINT (PIP);  Surgeon: Cammie Sickle., MD;  Location: [facility];  Service: Orthopedics;  Laterality: Left;  . right median nerve decompression    . SHOULDER ARTHROSCOPY     x2 left, 1 right  . sinus surgeries     x 4  . squamous lesions removed     neck and face  . STERIOD INJECTION Right 05/06/2015   Procedure: STEROID INJECTION;  Surgeon: Leanora Cover, MD;  Location: [place];  Service: Orthopedics;  Laterality: Right;  Right Index Finger Proximal InterPhalangeal Joint Injection  . TONSILLECTOMY AND ADENOIDECTOMY    . TUBAL LIGATION    . vocal polyps removed      Social History   Socioeconomic History  . Marital status: Married    Spouse name: Not on file  . Number of  children: 3  . Years of education: Not on file  . Highest education level: Not on file  Occupational History  . Occupation: retired, Licensed conveyancer    Employer: RETIRED  Tobacco Use  . Smoking status: Former Smoker    Packs/day: 0.50    Years: 3.00    Pack years: 1.50    Types: Cigarettes    Quit date: 05/29/1978    Years since quitting: 41.9  . Smokeless tobacco: Never Used  . Tobacco comment: quit smoking 35 years ago  Substance and Sexual Activity  . Alcohol use: No  . Drug use: No  . Sexual activity: Yes  Other Topics Concern  . Not on file  Social History Narrative   Pt lives at home with her husband. She is originally from [place]. Moved to [place] in 2005.     3 grown children - Son is a Pharmacist, community in the area, son in [place], daughter in [place]   Pt is a retired Marine scientist   Social Determinants of Radio broadcast assistant Strain: [place]   . Difficulty of Paying Living Expenses: Not hard at all  Food Insecurity: No Food Insecurity  . Worried About Charity fundraiser in the Last Year: Never true  . Ran Out of Food in the Last Year: Never true  Transportation Needs: No Transportation Needs  . Lack of Transportation (Medical): No  . Lack of Transportation (Non-Medical): No  Physical Activity: Insufficiently Active  . Days of Exercise per Week: 2 days  . Minutes of Exercise per Session: 10 min  Stress: No Stress Concern Present  . Feeling of Stress : Not at all  Social Connections: Moderately Integrated  . Frequency of Communication with Friends and Family: More than three times a week  . Frequency of Social Gatherings with Friends and Family: Once a week  . Attends Religious Services: Never  . Active Member of Clubs or Organizations: Yes  . Attends Archivist Meetings: 1 to 4 times per year  . Marital Status: Married  Human resources officer Violence: Not At Risk  . Fear of Current or Ex-Partner: No  . Emotionally Abused: No  . Physically Abused: No  . Sexually  Abused: No    Family History  Problem Relation Age of Onset  . Heart disease Mother   . Hyperlipidemia Mother        Jerilynn Mages, aunt  . Ovarian cancer Sister   . Lung cancer Sister        lung - stage 1  . Thyroid cancer Sister   .  Colon cancer Father        63  . Malignant hyperthermia Cousin   . Stomach cancer Other        first cousin  . Skin cancer Daughter   . Breast cancer Neg Hx      Immunization History  Administered Date(s) Administered  . Fluad Quad(high Dose 65+) 03/13/2019  . Influenza Split 12/27/2012  . Influenza Whole 01/28/2011  . Influenza,inj,Quad PF,6+ Mos 03/16/2014, 02/17/2015  . Influenza-Unspecified 02/12/2012, 02/05/2015, 03/02/2020  . PFIZER SARS-COV-2 Vaccination 06/17/2019, 07/08/2019, 03/02/2020  . Pneumococcal Conjugate-13 12/11/2013  . Pneumococcal Polysaccharide-23 05/29/2006  . Tdap 04/11/2011, 10/14/2018  . Zoster 04/14/2014  . Zoster Recombinat (Shingrix) 05/01/2019, 07/30/2019    Outpatient Encounter Medications as of 05/05/2020  Medication Sig  . albuterol (PROVENTIL) (2.5 MG/3ML) 0.083% nebulizer solution USE 1 VIAL IN NEBULIZER 4 TIMES DAILY  . amLODipine (NORVASC) 10 MG tablet Take 1 tablet (10 mg total) by mouth daily.  Marland Kitchen arformoterol (BROVANA) 15 MCG/2ML NEBU Take 2 mLs (15 mcg total) by nebulization 2 (two) times daily.  Marland Kitchen atorvastatin (LIPITOR) 40 MG tablet TAKE 1 TABLET BY MOUTH AT BEDTIME  . budesonide (PULMICORT) 0.25 MG/2ML nebulizer solution Take 2 mLs (0.25 mg total) by nebulization 2 (two) times daily.  . Cholecalciferol 25 MCG (1000 UT) capsule Take by mouth.  . dextroamphetamine (DEXTROSTAT) 10 MG tablet Take 1 tablet (10 mg total) by mouth in the morning, at noon, in the evening, and at bedtime.  . famotidine (PEPCID) 20 MG tablet Take 20 mg by mouth 2 (two) times daily.  Marland Kitchen gabapentin (NEURONTIN) 300 MG capsule Take 300 mg by mouth at bedtime.  Marland Kitchen GABAPENTIN PO Take 300 mg by mouth. 1 at 1:00 pm for nerve/muscle pain in legs   . levothyroxine (SYNTHROID) 25 MCG tablet Take 1 tablet (25 mcg total) by mouth daily with breakfast.  . mometasone (NASONEX) 50 MCG/ACT nasal spray PLACE 2 SPRAYS INTO THE NOSE DAILY  . montelukast (SINGULAIR) 10 MG tablet Take 1 tablet (10 mg total) by mouth at bedtime.  . mupirocin ointment (BACTROBAN) 2 % ADD ONE INCH OF OINTMENT TO 8 OUNCES OF SINUS RINSE BOTTLE DAILY  . NON FORMULARY Salon pas pain patches/ PRN  . PHENobarbital (LUMINAL) 32.4 MG tablet TAKE ONE (1) TABLET BY MOUTH EVERY DAY AT 6AM AND TAKE TWO TABLETS DAILY AT 8PM  . polyethylene glycol powder (GLYCOLAX/MIRALAX) powder Take 3/4 capful dissolved in at least 8 ounces water/juice once daily. May titrate as needed  . predniSONE (DELTASONE) 20 MG tablet Take 1 tablet (20 mg total) by mouth daily. (Patient taking differently: Take 15 mg by mouth daily. )  . sodium chloride (OCEAN) 0.65 % nasal spray Place into the nose.  . TEGRETOL-XR 100 MG 12 hr tablet Take 500 mg by mouth daily.   . VENTOLIN HFA 108 (90 Base) MCG/ACT inhaler SMARTSIG:1 Puff(s) Via Inhaler Every 4 Hours PRN  . Vitamin D, Ergocalciferol, (DRISDOL) 1.25 MG (50000 UT) CAPS capsule Take 1 capsule (50,000 Units total) by mouth every 7 (seven) days.  . [DISCONTINUED] dextroamphetamine (DEXTROSTAT) 10 MG tablet TAKE 1 TABLET BY MOUTH EVERY MORNING, ATNOON, IN THE EVENING AND AT BEDTIME  . [DISCONTINUED] desonide (DESOWEN) 0.05 % cream Apply to affected areas twice daily..  . [DISCONTINUED] diclofenac sodium (VOLTAREN) 1 % GEL Apply 1 application topically as needed.  . [DISCONTINUED] mometasone (ELOCON) 0.1 % cream Apply topically several times per week. Do not use daily consistently.   No facility-administered encounter medications on  file as of 05/05/2020.     ROS: Pertinent positives and negatives noted in HPI. Remainder of ROS non-contributory   Allergies  Allergen Reactions  . Iodinated Diagnostic Agents Anaphylaxis    Cardiac arrest  . Iodine Other  (See Comments)    Cardiac arrest As young adult, received iodinated contrast for IVP, went into cardiac arrest, was resuscitated and hospitalized. Has not received iodinated contrast since that episode. Reports no adverse reaction to betadine.   . Morphine Other (See Comments)    Cardiac arrest Patient has previously tolerated hydrocodone, hydromorphone and fentanyl   . Morphine And Related Other (See Comments)    Cardiac arrest  . Metrizamide Other (See Comments)    Cardiac arrest Cardiac arrest     BP 118/70   Pulse 86   Temp 97.7 F (36.5 C) (Temporal)   Ht 5\' 3"  (1.6 m)   Wt 124 lb 6.4 oz (56.4 kg)   SpO2 98%   BMI 22.04 kg/m    BP Readings from Last 3 Encounters:  05/05/20 118/70  12/03/19 118/64  10/31/19 126/80   Pulse Readings from Last 3 Encounters:  05/05/20 86  12/03/19 88  10/31/19 80   Wt Readings from Last 3 Encounters:  05/05/20 124 lb 6.4 oz (56.4 kg)  12/03/19 122 lb 9.6 oz (55.6 kg)  10/31/19 122 lb 3.2 oz (55.4 kg)     Physical Exam Constitutional:      General: She is not in acute distress.    Appearance: Normal appearance.  Cardiovascular:     Rate and Rhythm: Normal rate and regular rhythm.     Pulses: Normal pulses.  Pulmonary:     Effort: Pulmonary effort is normal.     Breath sounds: Normal breath sounds. No wheezing or rhonchi.  Abdominal:     General: Abdomen is flat. Bowel sounds are normal.     Palpations: Abdomen is soft.     Tenderness: There is no abdominal tenderness.  Musculoskeletal:     Right lower leg: No edema.     Left lower leg: No edema.  Neurological:     Mental Status: She is alert and oriented to person, place, and time. Mental status is at baseline.  Psychiatric:        Mood and Affect: Mood normal.        Behavior: Behavior normal.      A/P:  1. Hypersomnia-- on dextroamphetamine - stable, on med x years Refill: - dextroamphetamine (DEXTROSTAT) 10 MG tablet; Take 1 tablet (10 mg total) by mouth in  the morning, at noon, in the evening, and at bedtime.  Dispense: 120 tablet; Refill: 0  2. CIDP (chronic inflammatory demyelinating polyneuropathy) , Ataxia, Imbalance -- f/u at [facility] - still dealing with balance issues - next neuro appt at [facility] is next week  3. Seizures ([place]) - controlled, has not had a seizure in years - cont tegretol XR 500mg  daily,phenobarbital 32.4mg  daily - AST - ALT - Phenobarbital level - Carbamazepine Level (Tegretol), total  4. Hypothyroidism, unspecified type - stable on  Levothyroxine 42mcg daily - TSH - T4, free  5. Essential hypertension - controlled, at goal - cont norvasc 10mg  daily  - Lipid panel  6. Osteoporosis without current pathological fracture, unspecified osteoporosis type - on Vit D 50,000IU weekly - last Dexa in 02/2020   This visit occurred during the SARS-CoV-2 public health emergency.  Safety protocols were in place, including screening questions prior to the visit, additional usage of staff  PPE, and extensive cleaning of exam room while observing appropriate contact time as indicated for disinfecting solutions.

## 2020-05-07 LAB — CARBAMAZEPINE LEVEL, TOTAL: Carbamazepine Lvl: <2 mg/L — ABNORMAL LOW (ref 4.0–12.0)

## 2020-05-07 LAB — PHENOBARBITAL LEVEL: Phenobarbital, Serum: 27 mg/L (ref 15.0–40.0)

## 2020-05-13 ENCOUNTER — Encounter: Payer: Self-pay | Admitting: Family Medicine

## 2020-05-13 DIAGNOSIS — M4802 Spinal stenosis, cervical region: Secondary | ICD-10-CM | POA: Diagnosis not present

## 2020-05-13 DIAGNOSIS — R252 Cramp and spasm: Secondary | ICD-10-CM | POA: Diagnosis not present

## 2020-05-13 DIAGNOSIS — G629 Polyneuropathy, unspecified: Secondary | ICD-10-CM | POA: Diagnosis not present

## 2020-05-25 ENCOUNTER — Encounter: Payer: Self-pay | Admitting: Physical Therapy

## 2020-05-25 ENCOUNTER — Ambulatory Visit: Payer: Medicare Other | Attending: Psychiatry | Admitting: Physical Therapy

## 2020-05-25 ENCOUNTER — Other Ambulatory Visit: Payer: Self-pay

## 2020-05-25 DIAGNOSIS — M542 Cervicalgia: Secondary | ICD-10-CM | POA: Diagnosis not present

## 2020-05-25 DIAGNOSIS — M6281 Muscle weakness (generalized): Secondary | ICD-10-CM | POA: Diagnosis not present

## 2020-05-25 DIAGNOSIS — R252 Cramp and spasm: Secondary | ICD-10-CM

## 2020-05-25 NOTE — Therapy (Signed)
[facility] [address] [place], [place], [address] Phone: [phone]   Fax:  [phone]  Physical Therapy Evaluation  Patient Details  Name: Autym Siess MRN: [id number] Date of Birth: 1932-10-06 Referring Provider (PT): Cirigliano   Encounter Date: 05/25/2020   PT End of Session - 05/25/20 1706    Visit Number 1    Date for PT Re-Evaluation 07/26/20    PT Start Time 1614    PT Stop Time 1650    PT Time Calculation (min) 36 min    Activity Tolerance Patient tolerated treatment well;Patient limited by pain    Behavior During Therapy [facility] for tasks assessed/performed           Past Medical History:  Diagnosis Date  . Allergic rhinitis   . Anemia   . Arthritis   . Asthma -COPD   . Blood transfusion 1957  . Breast mass    right breast in milk duct, bx neg  . Chronic fatigue syndrome   . Chronic inflammatory demyelinating polyneuropathy (HCC) 03/2011  . chronic sinusitis 08/08/2012  . Colon polyp   . Constipation   . COPD (chronic obstructive pulmonary disease) (HCC)   . Cystocele 09/16/2012  . GERD (gastroesophageal reflux disease)   . Hyperlipidemia   . Hypertension   . Hypothyroid   . Neurogenic bladder 2013   husband caths pt, manages with timed void  . Osteoporosis   . Pulmonary embolism (HCC)    1980s  . Renal cyst    Non-complex, contrast MRI 2013 with L>R non-complex cysts, dominant 3.7 left lower pole  . Seizures (HCC)    1990 last seizures on meds Phenobarb  . Shingles late 45's  . Skin cancer    squamous cell  . Thyroid nodule    Right thyroid lobe, only seen on Sagittal imaging measures 2.3 cm in craniocaudal dimension and appears stable  . Tubular adenoma   . Vitamin D deficiency     Past Surgical History:  Procedure Laterality Date  . ABDOMINAL HYSTERECTOMY    . ANTERIOR FUSION CLIVUS-C2 EXTRAORAL W/ ODONTOID EXCISION  8/14  . APPENDECTOMY    . BASAL CELL CARCINOMA EXCISION      face  . BLADDER SUSPENSION    . BREAST DUCTAL SYSTEM EXCISION Right 01/15/2014   Procedure: EXCISION DUCTAL SYSTEM RIGHT BREAST;  Surgeon: Almond Lint, MD;  Location: [facility];  Service: General;  Laterality: Right;  . BREAST EXCISIONAL BIOPSY Right   . CARPAL TUNNEL RELEASE     right  . CATARACT EXTRACTION     bilateral  . cervical neck ablation     x 7, C3-C6/3 screws and plate  . CESAREAN SECTION    . CYSTOCELE REPAIR    . DILATION AND CURETTAGE OF UTERUS    . duptyren's contracture right hand    . FINGER ARTHRODESIS Right 05/06/2015   Procedure: RIGHT RING PROXIMAL INTERPHALANGEAL FUSION (PIP);  Surgeon: Betha Loa, MD;  Location: [facility];  Service: Orthopedics;  Laterality: Right;  . FINGER GANGLION CYST EXCISION     right  . HEMICOLECTOMY Right   . JOINT REPLACEMENT     right and left basal joints of thumbs  . left finger fusion     3 fingers on left hand/one finger right  . left ovary and tube removed    . NASAL POLYP SURGERY     4 sinus surgeries  . NASAL SEPTUM SURGERY    .  PANNICULECTOMY    . PROXIMAL INTERPHALANGEAL FUSION (PIP) Left 09/09/2013   Procedure: FUSION LEFT INDEX PROXIMAL INTERPHALANGEAL JOINT (PIP);  Surgeon: Cammie Sickle., MD;  Location: [facility];  Service: Orthopedics;  Laterality: Left;  . right median nerve decompression    . SHOULDER ARTHROSCOPY     x2 left, 1 right  . sinus surgeries     x 4  . squamous lesions removed     neck and face  . STERIOD INJECTION Right 05/06/2015   Procedure: STEROID INJECTION;  Surgeon: Leanora Cover, MD;  Location: [facility];  Service: Orthopedics;  Laterality: Right;  Right Index Finger Proximal InterPhalangeal Joint Injection  . TONSILLECTOMY AND ADENOIDECTOMY    . TUBAL LIGATION    . vocal polyps removed      There were no vitals filed for this visit.    Subjective Assessment - 05/25/20 1611    Subjective Pt reports cervicalgia for  the past year worsening over the past few months. Pt had ACDF C4-C6 in 2014 at [facility]. Pt recently had MRI and xray showing inflammation and stenosis in the area, surgical hardware intact. Pt received cervical ablations which only she reports only worked for a few days following surgery. Pt reports worsening neck pain B L>R. Pt received PT at neuro OP earlier this year d/t weakness in LE and L foot drop. Pt reports she has tingling in hands that feels like a glove along with hands feeling cold. Pt is being followed by a neurologist for issues with neuropathy. Pt denies radiating pain down UEs. Pt localizes most pain to L side of neck. Pt denies changes in vision, double vision, or new HA.    Patient is accompained by: Family member   husband   Pertinent History pt also takes carbamezepine ER 100 mg 4x/day but not found in meds. OA, asthma, COPD, HTN, neuropathy, ant cervical fusion, breast CA, arthroscopic left knee sg, 3 frozen shoulders, Right basal joint replacement Rt hand, left middle finger fusion, carpal tunnel syndrome, follows with neuro at [facility] with long h/o epilepsy.    Limitations Lifting;House hold activities    Diagnostic tests xrays and MRI    Patient Stated Goals increase neck ROM and reduce pain    Currently in Pain? Yes    Pain Score 3    6/10 at worse   Pain Location Neck    Pain Orientation Left;Right    Pain Descriptors / Indicators Sharp    Pain Type Chronic pain    Pain Onset More than a month ago    Pain Frequency Constant    Aggravating Factors  prolonged positioning    Pain Relieving Factors patch, rest              [facility] PT Assessment - 05/25/20 0001      Assessment   Medical Diagnosis Cervicalgia    Referring Provider (PT) Cirigliano    Hand Dominance Right    Prior Therapy PT for LE weakness earlier this year      Precautions   Precautions Fall      Restrictions   Weight Bearing Restrictions No      Balance Screen   Has the patient fallen in the past 6  months No   many falls in 2020, none in the last 6 months   Has the patient had a decrease in activity level because of a fear of falling?  Yes    Is the patient reluctant to  leave their home because of a fear of falling?  No      Home Environment   Additional Comments Reports that they moved to new home that is level due to her falls. Husband assists with shower and gets in with her to help for safety      Prior Function   Level of Independence Independent with community mobility with device    Vocation Retired      Art therapist   Posture/Postural Control Postural limitations    Postural Limitations Rounded Shoulders;Forward head      ROM / Strength   AROM / PROM / Strength AROM;Strength      AROM   Overall AROM Comments B shoulder AROM mild limitation in IR ER, otherwise [facility]    AROM Assessment Site Cervical    Cervical Flexion 30    Cervical Extension 20    Cervical - Right Side Bend 20    Cervical - Left Side Bend 20    Cervical - Right Rotation 35    Cervical - Left Rotation 35      Strength   Overall Strength Comments BUE strength 4+/5 except R IR/ER 4-/5      Palpation   Spinal mobility hypomobility of cervical spine, crepitus with cervical rotation    Palpation comment tender to palpation B UT L>R      Special Tests    Special Tests Cervical    Cervical Tests Spurling's      Spurling's   Findings Positive    Side Left      Ambulation/Gait   Gait Comments walks with rollator; foot slap B L>R; unsteady without use of rollator                      Objective measurements completed on examination: See above findings.       [facility] Adult PT Treatment/Exercise - 05/25/20 0001      Exercises   Exercises Neck      Neck Exercises: Seated   Neck Retraction 10 reps;3 secs    Neck Retraction Limitations verbal and tactile cuing for form; very little movement in upper cervical spine    Cervical Rotation 10 reps    Other Seated Exercise  cervical flexion/extension AROM      Neck Exercises: Stretches   Upper Trapezius Stretch Right;Left;1 rep;20 seconds    Levator Stretch Right;Left;1 rep;20 seconds                    PT Short Term Goals - 05/25/20 1715      PT SHORT TERM GOAL #1   Title Pt will be I with initial HEP    Time 2    Period Weeks    Status New    Target Date 06/08/20             PT Long Term Goals - 05/25/20 1715      PT LONG TERM GOAL #1   Title Pt will demo cervical ROM WFL with </= 1/10 cervical pain    Time 6    Period Weeks    Status New    Target Date 07/06/20      PT LONG TERM GOAL #2   Title Pt will report 50% reduction in cervical pain    Time 6    Period Weeks    Status New    Target Date 07/06/20      PT LONG TERM GOAL #3   Title Pt  will be I with advanced HEP    Time 6    Period Weeks    Status New    Target Date 07/06/20                  Plan - 05/25/20 1707    Clinical Impression Statement Pt presents to clinic with reports of cervical pain for the past year and worsening over the past few months. Pt has hx of ACDF C4-C6 in 2014; has had neck pain intermittently since then. Pt has chronic inflammatory demyelinating polyneuropathy and is being followed by a neurologist for this condition; pt seen by OP Neuro PT earlier this year for LE weakness and LLE foot drop. Pt ambulates with rollator as PLOF. Pt demos glove like numbness of hands d/t neuropathy; denies radiating pain in UEs. Pt demos signficant cervical ROM deficits with audible crepitus and pain at end range, tenderness to palpation B UT L>R, positive Spurling's on L, and postural weakness. Pt would benefit from skilled PT to address the above impairments.    Personal Factors and Comorbidities Comorbidity 3+    Comorbidities asthma, COPD, HTN, polyneuropathy, epilepsy    Examination-Activity Limitations Reach Overhead;Lift    Examination-Participation Restrictions Community Activity;Cleaning;Meal  Prep;Interpersonal Relationship    Stability/Clinical Decision Making Stable/Uncomplicated    Clinical Decision Making Low    Rehab Potential Good    PT Frequency 2x / week    PT Duration 6 weeks    PT Treatment/Interventions ADLs/Self Care Home Management;Electrical Stimulation;Iontophoresis 4mg /ml Dexamethasone;Moist Heat;Neuromuscular re-education;Therapeutic exercise;Therapeutic activities;Patient/family education;Manual techniques;Passive range of motion    PT Next Visit Plan cervical ROM/flexibility ex's, scap stab ex's, manual/modalities as indicated    PT Home Exercise Plan UT/levator stretch, cervical rotation/flexion/extension AROM    Consulted and Agree with Plan of Care Patient;Family member/caregiver    Family Member Consulted husband           Patient will benefit from skilled therapeutic intervention in order to improve the following deficits and impairments:  Decreased range of motion,Increased muscle spasms,Impaired UE functional use,Pain,Impaired flexibility,Postural dysfunction  Visit Diagnosis: Neck pain  Cramp and spasm  Muscle weakness (generalized)     Problem List Patient Active Problem List   Diagnosis Date Noted  . PCP NOTES >>>>>>>>>>>>>>>>>>> 09/16/2015  . Multiple thyroid nodules 03/30/2015  . Annual physical exam 08/07/2014  . Bloody discharge from nipple - s/p surgery, resolved  12/29/2013  . Breast mass, right 12/11/2013  . At high risk for falls 12/11/2013  . Renal cysts, acquired, bilateral--per urology 05/04/2013  . Elevated LFTs 04/01/2013  . Cervical spinal stenosis 11/21/2012  . Nondiabetic gastroparesis 08/20/2012  . Hypothyroidism 08/08/2012  . Gastroparesis 05/06/2012  . Muscle cramp 01/19/2012  . Fatigue 01/14/2012  . Neurogenic bladder, prolapse ,h/o urinary retention, self cath, sees urology 11/05/2011  . Fecal incontinence 10/03/2011  . Osteoporosis 07/24/2011  . Adenoma of cecum 07/17/2011  . Chronic constipation  07/17/2011  . Skin lesion 07/08/2011  . Chronic neck pain 04/16/2011  . Rectocele 04/16/2011  . Hypersomnia-- on dextroamphetamine 04/16/2011  . Vitamin D deficiency 04/16/2011  . Family history of colon cancer 04/16/2011  . Abnormal blood chemistry 04/16/2011  . Anemia   . COPD (chronic obstructive pulmonary disease) gold stage B   . Depression   . GERD (gastroesophageal reflux disease)   . Hyperlipidemia   . Hypertension   . Allergic rhinitis   . Pulmonary embolism-- in the 66s   . Skin cancer   . Seizures ([place])   .  CIDP (chronic inflammatory demyelinating polyneuropathy) , Ataxia, Imbalance -- f/u at [facility], PT, DPT Donald Prose Moira Umholtz 05/25/2020, 5:17 PM  [address] [place], [place], [address] Phone: [phone]   Fax:  [phone]  Name: Ishani Klingele MRN: [id number] Date of Birth: [birth year]

## 2020-05-27 ENCOUNTER — Ambulatory Visit
Admission: RE | Admit: 2020-05-27 | Discharge: 2020-05-27 | Disposition: A | Discharge status: Home / Self Care | Payer: Medicare Other | Source: Ambulatory Visit | Attending: Family Medicine | Admitting: Family Medicine

## 2020-05-27 ENCOUNTER — Other Ambulatory Visit: Payer: Self-pay

## 2020-05-27 DIAGNOSIS — Z1231 Encounter for screening mammogram for malignant neoplasm of breast: Secondary | ICD-10-CM

## 2020-06-02 IMAGING — MG MM DIGITAL SCREENING BILAT W/ TOMO W/ CAD
8 series · 8 of 24 positions shown · non-contrast
Comparison: Previous exam(s).

CLINICAL DATA: Screening.

EXAM:
DIGITAL SCREENING BILATERAL MAMMOGRAM WITH TOMO AND CAD

[L MLO synth-2D]
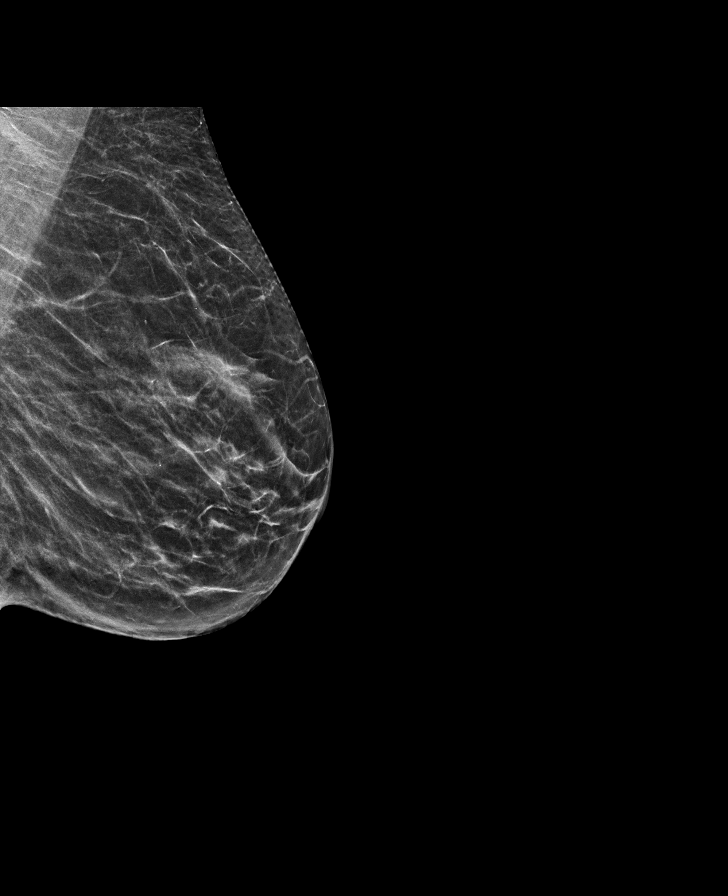

[R CC synth-2D]
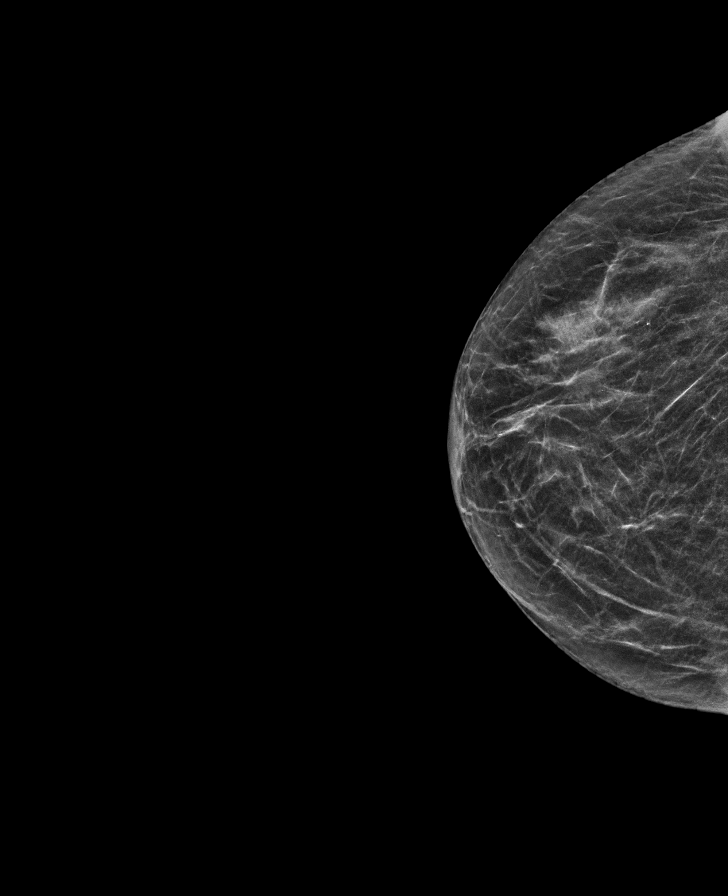

[R MLO synth-2D]
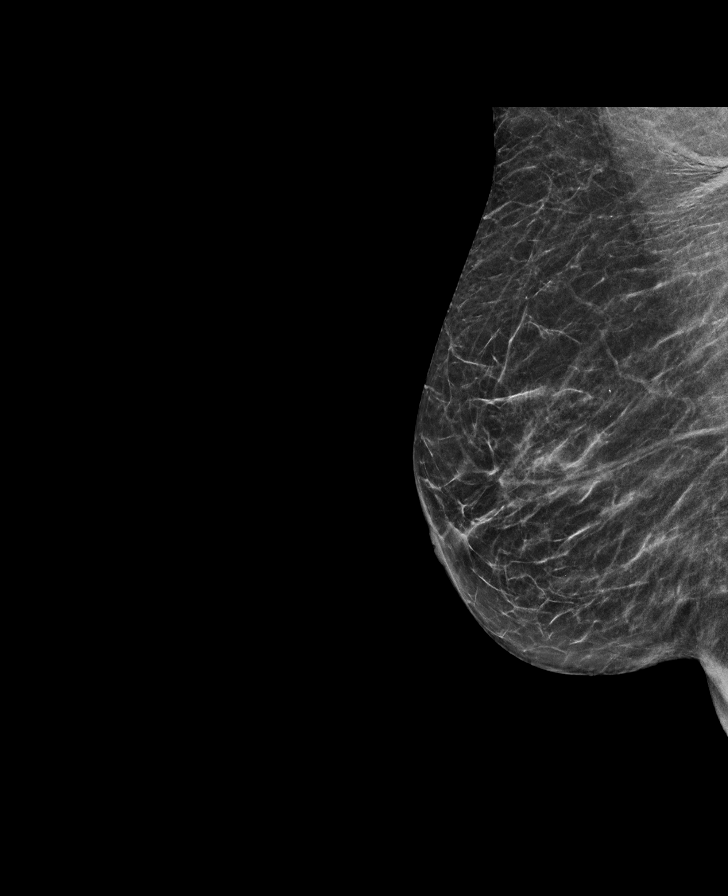

[L CC synth-2D]
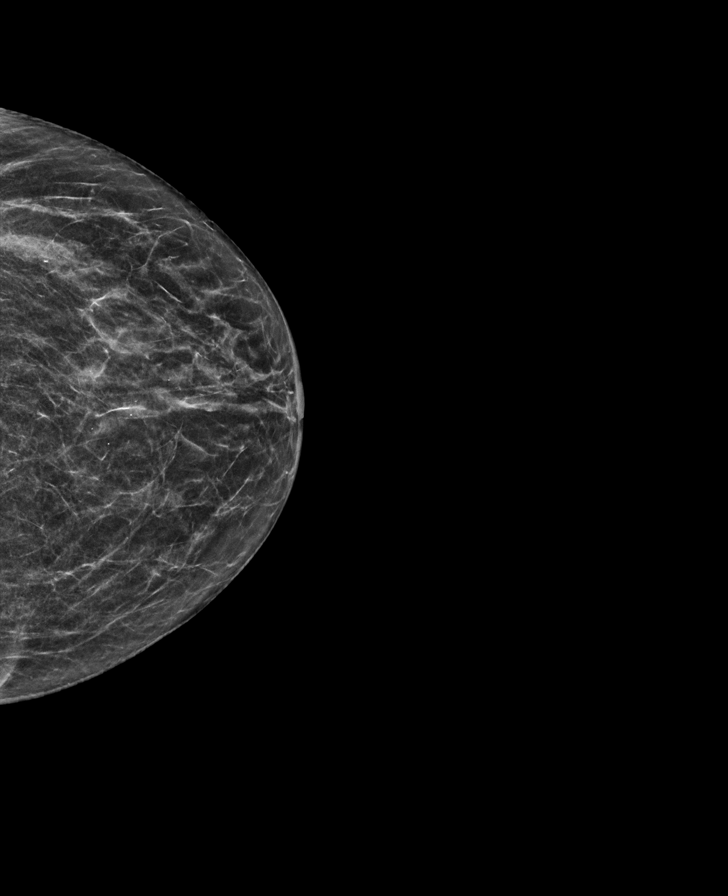

[R CC tomo · tomo slice 25/49.0]
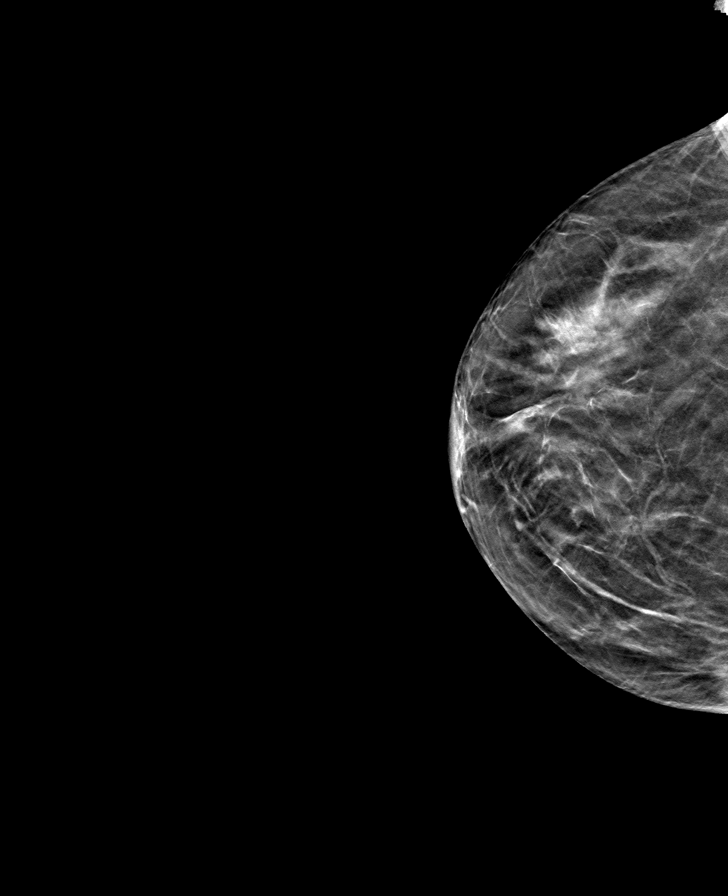

[R MLO tomo · tomo slice 27/53.0]
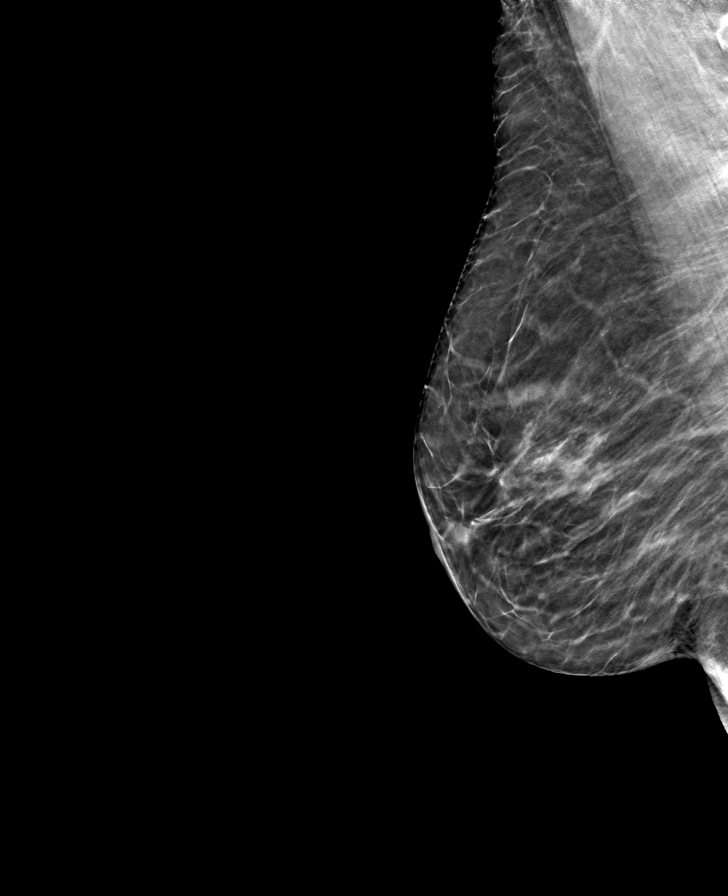

[L CC tomo · tomo slice 25/49.0]
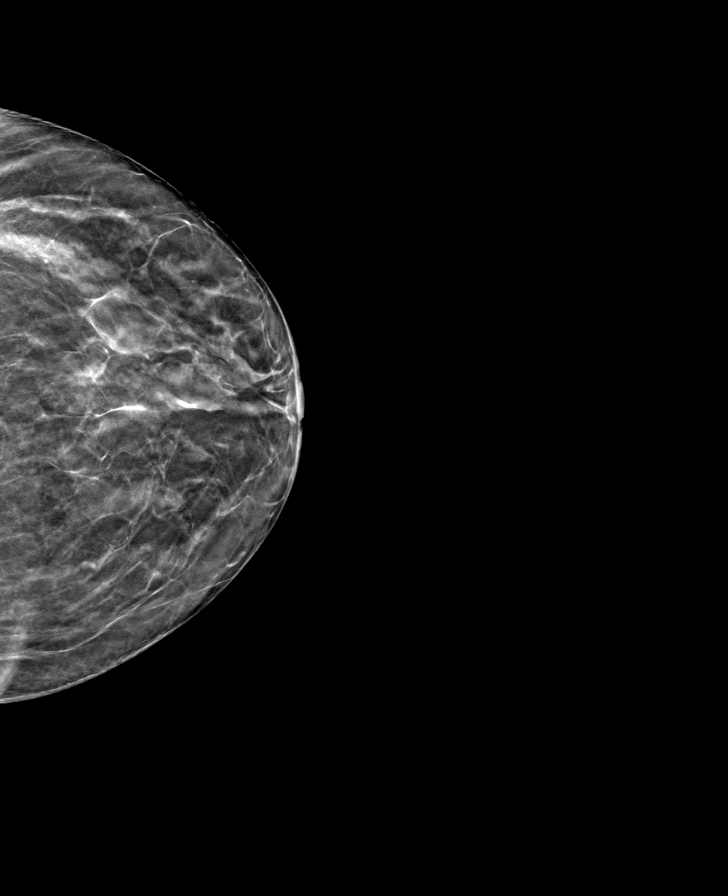

[L MLO tomo · tomo slice 29/56.0]
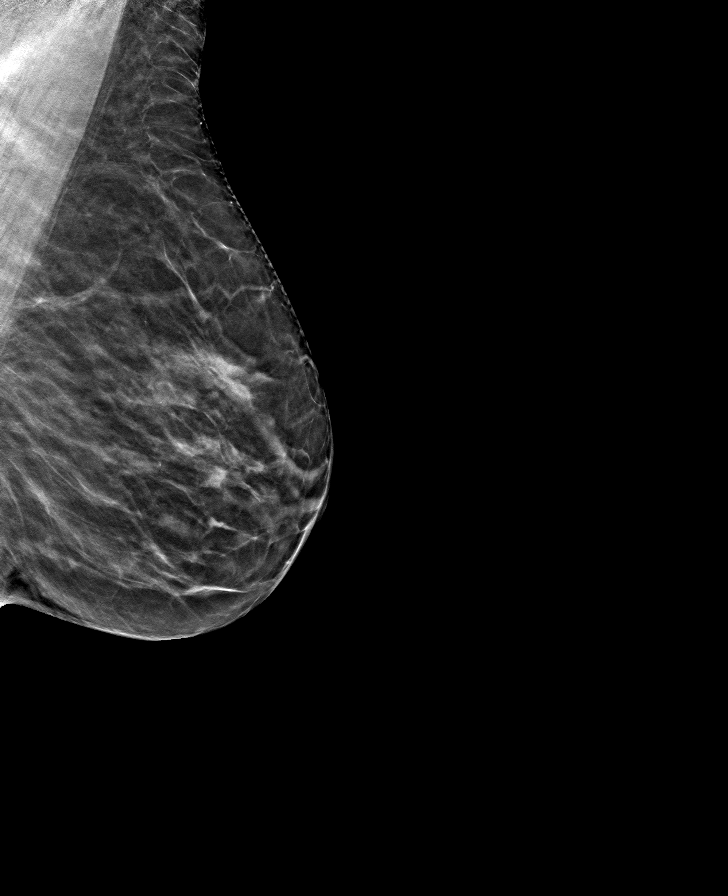

[8 of 24 positions shown; findings below may reference images not displayed]

ACR Breast Density Category c: The breast tissue is heterogeneously
dense, which may obscure small masses.
FINDINGS: There are no findings suspicious for malignancy. Images were
processed with CAD.
IMPRESSION: No mammographic evidence of malignancy. A result letter of this
screening mammogram will be mailed directly to the patient.

RECOMMENDATION:
Screening mammogram in one year. (Code:FT-U-LHB)

BI-RADS CATEGORY  1: Negative.

## 2020-06-04 ENCOUNTER — Ambulatory Visit: Payer: Medicare Other | Admitting: Gastroenterology

## 2020-06-08 ENCOUNTER — Other Ambulatory Visit: Payer: Self-pay

## 2020-06-08 ENCOUNTER — Ambulatory Visit: Payer: Medicare Other | Attending: Psychiatry | Admitting: Physical Therapy

## 2020-06-08 DIAGNOSIS — M6281 Muscle weakness (generalized): Secondary | ICD-10-CM | POA: Insufficient documentation

## 2020-06-08 DIAGNOSIS — R252 Cramp and spasm: Secondary | ICD-10-CM | POA: Diagnosis not present

## 2020-06-08 DIAGNOSIS — M542 Cervicalgia: Secondary | ICD-10-CM | POA: Insufficient documentation

## 2020-06-08 NOTE — Therapy (Signed)
[address] [place], [place], [address] Phone: [phone]   Fax:  [phone]  Physical Therapy Treatment  Patient Details  Name: [name] MRN: [id number] Date of Birth: 1932-07-06 Referring Provider (PT): Cirigliano   Encounter Date: 06/08/2020   PT End of Session - 06/08/20 1215    Visit Number 2    Date for PT Re-Evaluation 07/26/20    PT Start Time 1140    PT Stop Time 1230    PT Time Calculation (min) 50 min           Past Medical History:  Diagnosis Date  . Allergic rhinitis   . Anemia   . Arthritis   . Asthma -COPD   . Blood transfusion 1957  . Breast mass    right breast in milk duct, bx neg  . Chronic fatigue syndrome   . Chronic inflammatory demyelinating polyneuropathy ([place]) 03/2011  . chronic sinusitis 08/08/2012  . Colon polyp   . Constipation   . COPD (chronic obstructive pulmonary disease) ([place])   . Cystocele 09/16/2012  . GERD (gastroesophageal reflux disease)   . Hyperlipidemia   . Hypertension   . Hypothyroid   . Neurogenic bladder 2013   husband caths pt, manages with timed void  . Osteoporosis   . Pulmonary embolism ([place])    1980s  . Renal cyst    Non-complex, contrast MRI 2013 with L>R non-complex cysts, dominant 3.7 left lower pole  . Seizures ([place])    1990 last seizures on meds Phenobarb  . Shingles late 46's  . Skin cancer    squamous cell  . Thyroid nodule    Right thyroid lobe, only seen on Sagittal imaging measures 2.3 cm in craniocaudal dimension and appears stable  . Tubular adenoma   . Vitamin D deficiency     Past Surgical History:  Procedure Laterality Date  . ABDOMINAL HYSTERECTOMY    . ANTERIOR FUSION CLIVUS-C2 EXTRAORAL W/ ODONTOID EXCISION  8/14  . APPENDECTOMY    . BASAL CELL CARCINOMA EXCISION     face  . BLADDER SUSPENSION    . BREAST DUCTAL SYSTEM EXCISION Right 01/15/2014   Procedure: EXCISION DUCTAL SYSTEM RIGHT BREAST;  Surgeon:  Stark Klein, MD;  Location: [facility];  Service: General;  Laterality: Right;  . BREAST EXCISIONAL BIOPSY Right   . CARPAL TUNNEL RELEASE     right  . CATARACT EXTRACTION     bilateral  . cervical neck ablation     x 7, C3-C6/3 screws and plate  . CESAREAN SECTION    . CYSTOCELE REPAIR    . DILATION AND CURETTAGE OF UTERUS    . duptyren's contracture right hand    . FINGER ARTHRODESIS Right 05/06/2015   Procedure: RIGHT RING PROXIMAL INTERPHALANGEAL FUSION (PIP);  Surgeon: Leanora Cover, MD;  Location: [facility];  Service: Orthopedics;  Laterality: Right;  . FINGER GANGLION CYST EXCISION     right  . HEMICOLECTOMY Right   . JOINT REPLACEMENT     right and left basal joints of thumbs  . left finger fusion     3 fingers on left hand/one finger right  . left ovary and tube removed    . NASAL POLYP SURGERY     4 sinus surgeries  . NASAL SEPTUM SURGERY    . PANNICULECTOMY    . PROXIMAL INTERPHALANGEAL FUSION (PIP) Left 09/09/2013   Procedure: FUSION LEFT INDEX PROXIMAL INTERPHALANGEAL JOINT (PIP);  Surgeon: Cammie Sickle., MD;  Location[facility];  Service: Orthopedics;  Laterality: Left;  . right median nerve decompression    . SHOULDER ARTHROSCOPY     x2 left, 1 right  . sinus surgeries     x 4  . squamous lesions removed     neck and face  . STERIOD INJECTION Right 05/06/2015   Procedure: STEROID INJECTION;  Surgeon: Leanora Cover, MD;  Location: [facility];  Service: Orthopedics;  Laterality: Right;  Right Index Finger Proximal InterPhalangeal Joint Injection  . TONSILLECTOMY AND ADENOIDECTOMY    . TUBAL LIGATION    . vocal polyps removed      There were no vitals filed for this visit.   Subjective Assessment - 06/08/20 1144    Subjective I think I have neuropathy because my hands and feet are numb and it affects my walking. neck always hurts live off pain patches and tylenol. pt verb doing HEP daily- "painful  but does them"    Currently in Pain? Yes    Pain Score 4     Pain Location Neck    Pain Orientation Right;Left                             [facility] Adult PT Treatment/Exercise - 06/08/20 0001      Exercises   Exercises Neck;Shoulder      Neck Exercises: Machines for Strengthening   UBE (Upper Arm Bike) L 1 2 min fwd/2 min back      Neck Exercises: Theraband   Scapula Retraction 20 reps   yellow   Shoulder Extension 20 reps   yellow   Shoulder External Rotation 20 reps   yellow     Neck Exercises: Standing   Other Standing Exercises 2# shruggs,backwards rolls and scap squeeze 10x min A      Modalities   Modalities Moist Heat;Electrical Stimulation      Moist Heat Therapy   Number Minutes Moist Heat 15 Minutes    Moist Heat Location Cervical;Shoulder      Electrical Stimulation   Electrical Stimulation Location CT junc    Electrical Stimulation Action IFC    Electrical Stimulation Parameters sitting    Electrical Stimulation Goals Pain      Manual Therapy   Manual Therapy Soft tissue mobilization    Soft tissue mobilization cerv/traps                  PT Education - 06/08/20 1158    Education Details reviewed HEP    Person(s) Educated Patient    Methods Explanation;Demonstration;Tactile cues;Verbal cues    Comprehension Verbalized understanding;Returned demonstration            PT Short Term Goals - 06/08/20 1219      PT SHORT TERM GOAL #1   Title Pt will be I with initial HEP    Status Achieved             PT Long Term Goals - 05/25/20 1715      PT LONG TERM GOAL #1   Title Pt will demo cervical ROM WFL with </= 1/10 cervical pain    Time 6    Period Weeks    Status New    Target Date 07/06/20      PT LONG TERM GOAL #2   Title Pt will report 50% reduction in cervical pain    Time 6  Period Weeks    Status New    Target Date 07/06/20      PT LONG TERM GOAL #3   Title Pt will be I with advanced HEP    Time 6     Period Weeks    Status New    Target Date 07/06/20                 Plan - 06/08/20 1216    Clinical Impression Statement reviewed HEP and cued for better tech. started postural ex with cuing verb and tacile pt tends to keep traps engaged with all ex. STW and modalities to help with pain    PT Treatment/Interventions ADLs/Self Care Home Management;Electrical Stimulation;Iontophoresis 4mg /ml Dexamethasone;Moist Heat;Neuromuscular re-education;Therapeutic exercise;Therapeutic activities;Patient/family education;Manual techniques;Passive range of motion    PT Next Visit Plan assess and progress           Patient will benefit from skilled therapeutic intervention in order to improve the following deficits and impairments:  Decreased range of motion,Increased muscle spasms,Impaired UE functional use,Pain,Impaired flexibility,Postural dysfunction  Visit Diagnosis: Cramp and spasm  Neck pain  Muscle weakness (generalized)     Problem List Patient Active Problem List   Diagnosis Date Noted  . PCP NOTES >>>>>>>>>>>>>>>>>>> 09/16/2015  . Multiple thyroid nodules 03/30/2015  . Annual physical exam 08/07/2014  . Bloody discharge from nipple - s/p surgery, resolved  12/29/2013  . Breast mass, right 12/11/2013  . At high risk for falls 12/11/2013  . Renal cysts, acquired, bilateral--per urology 05/04/2013  . Elevated LFTs 04/01/2013  . Cervical spinal stenosis 11/21/2012  . Nondiabetic gastroparesis 08/20/2012  . Hypothyroidism 08/08/2012  . Gastroparesis 05/06/2012  . Muscle cramp 01/19/2012  . Fatigue 01/14/2012  . Neurogenic bladder, prolapse ,h/o urinary retention, self cath, sees urology 11/05/2011  . Fecal incontinence 10/03/2011  . Osteoporosis 07/24/2011  . Adenoma of cecum 07/17/2011  . Chronic constipation 07/17/2011  . Skin lesion 07/08/2011  . Chronic neck pain 04/16/2011  . Rectocele 04/16/2011  . Hypersomnia-- on dextroamphetamine 04/16/2011  . Vitamin D  deficiency 04/16/2011  . Family history of colon cancer 04/16/2011  . Abnormal blood chemistry 04/16/2011  . Anemia   . COPD (chronic obstructive pulmonary disease) gold stage B   . Depression   . GERD (gastroesophageal reflux disease)   . Hyperlipidemia   . Hypertension   . Allergic rhinitis   . Pulmonary embolism-- in the 27s   . Skin cancer   . Seizures ([place])   . CIDP (chronic inflammatory demyelinating polyneuropathy) , Ataxia, Imbalance -- f/u at [facility] 06/08/2020, 12:19 PM  [address]. [place], [place], [address] Phone: [phone]   Fax:  [phone]  Name: [name] MRN: [id number] Date of Birth: 07-08-1932

## 2020-06-11 ENCOUNTER — Other Ambulatory Visit: Payer: Self-pay

## 2020-06-11 ENCOUNTER — Encounter: Payer: Self-pay | Admitting: Physical Therapy

## 2020-06-11 ENCOUNTER — Ambulatory Visit: Payer: Medicare Other | Admitting: Physical Therapy

## 2020-06-11 DIAGNOSIS — R252 Cramp and spasm: Secondary | ICD-10-CM

## 2020-06-11 DIAGNOSIS — M6281 Muscle weakness (generalized): Secondary | ICD-10-CM

## 2020-06-11 DIAGNOSIS — M542 Cervicalgia: Secondary | ICD-10-CM

## 2020-06-11 NOTE — Therapy (Signed)
[place]. [place], [place], [address] Phone: [phone]   Fax:  [phone]  Physical Therapy Treatment  Patient Details  Name: [name] MRN: [id number] Date of Birth: 1933/04/13 Referring Provider (PT): Cirigliano   Encounter Date: 06/11/2020   PT End of Session - 06/11/20 1048    Visit Number 3    Date for PT Re-Evaluation 07/26/20    PT Start Time 1008    PT Stop Time 1057    PT Time Calculation (min) 49 min    Activity Tolerance Patient tolerated treatment well    Behavior During Therapy [facility] for tasks assessed/performed           Past Medical History:  Diagnosis Date  . Allergic rhinitis   . Anemia   . Arthritis   . Asthma -COPD   . Blood transfusion 1957  . Breast mass    right breast in milk duct, bx neg  . Chronic fatigue syndrome   . Chronic inflammatory demyelinating polyneuropathy ([place]) 03/2011  . chronic sinusitis 08/08/2012  . Colon polyp   . Constipation   . COPD (chronic obstructive pulmonary disease) ([place])   . Cystocele 09/16/2012  . GERD (gastroesophageal reflux disease)   . Hyperlipidemia   . Hypertension   . Hypothyroid   . Neurogenic bladder 2013   husband caths pt, manages with timed void  . Osteoporosis   . Pulmonary embolism ([place])    1980s  . Renal cyst    Non-complex, contrast MRI 2013 with L>R non-complex cysts, dominant 3.7 left lower pole  . Seizures ([place])    1990 last seizures on meds Phenobarb  . Shingles late 59's  . Skin cancer    squamous cell  . Thyroid nodule    Right thyroid lobe, only seen on Sagittal imaging measures 2.3 cm in craniocaudal dimension and appears stable  . Tubular adenoma   . Vitamin D deficiency     Past Surgical History:  Procedure Laterality Date  . ABDOMINAL HYSTERECTOMY    . ANTERIOR FUSION CLIVUS-C2 EXTRAORAL W/ ODONTOID EXCISION  8/14  . APPENDECTOMY    . BASAL CELL CARCINOMA EXCISION     face  . BLADDER  SUSPENSION    . BREAST DUCTAL SYSTEM EXCISION Right 01/15/2014   Procedure: EXCISION DUCTAL SYSTEM RIGHT BREAST;  Surgeon: Stark Klein, MD;  Location: [place];  Service: General;  Laterality: Right;  . BREAST EXCISIONAL BIOPSY Right   . CARPAL TUNNEL RELEASE     right  . CATARACT EXTRACTION     bilateral  . cervical neck ablation     x 7, C3-C6/3 screws and plate  . CESAREAN SECTION    . CYSTOCELE REPAIR    . DILATION AND CURETTAGE OF UTERUS    . duptyren's contracture right hand    . FINGER ARTHRODESIS Right 05/06/2015   Procedure: RIGHT RING PROXIMAL INTERPHALANGEAL FUSION (PIP);  Surgeon: Leanora Cover, MD;  Location: [place];  Service: Orthopedics;  Laterality: Right;  . FINGER GANGLION CYST EXCISION     right  . HEMICOLECTOMY Right   . JOINT REPLACEMENT     right and left basal joints of thumbs  . left finger fusion     3 fingers on left hand/one finger right  . left ovary and tube removed    . NASAL POLYP SURGERY     4 sinus surgeries  . NASAL SEPTUM SURGERY    . PANNICULECTOMY    .  PROXIMAL INTERPHALANGEAL FUSION (PIP) Left 09/09/2013   Procedure: FUSION LEFT INDEX PROXIMAL INTERPHALANGEAL JOINT (PIP);  Surgeon: Cammie Sickle., MD;  Location[facility];  Service: Orthopedics;  Laterality: Left;  . right median nerve decompression    . SHOULDER ARTHROSCOPY     x2 left, 1 right  . sinus surgeries     x 4  . squamous lesions removed     neck and face  . STERIOD INJECTION Right 05/06/2015   Procedure: STEROID INJECTION;  Surgeon: Leanora Cover, MD;  Location: [place];  Service: Orthopedics;  Laterality: Right;  Right Index Finger Proximal InterPhalangeal Joint Injection  . TONSILLECTOMY AND ADENOIDECTOMY    . TUBAL LIGATION    . vocal polyps removed      There were no vitals filed for this visit.   Subjective Assessment - 06/11/20 1008    Subjective Pt reports that overall her neck is feeling better.     Currently in Pain? Yes    Pain Score 2     Pain Location Neck                             [facility] Adult PT Treatment/Exercise - 06/11/20 0001      Neck Exercises: Machines for Strengthening   UBE (Upper Arm Bike) L1 3 min each      Neck Exercises: Theraband   Scapula Retraction 20 reps    Scapula Retraction Limitations yellow    Shoulder Extension 20 reps    Shoulder Extension Limitations yellow    Shoulder External Rotation 20 reps    Shoulder External Rotation Limitations yellow      Neck Exercises: Seated   Neck Retraction 10 reps;3 secs    Neck Retraction Limitations verbal and tactile cuing for form; very little movement in upper cervical spine    Cervical Rotation 10 reps    Cervical Rotation Limitations and cervical flexion/extension    W Back 20 reps    W Back Weights (lbs) 2    Shoulder Shrugs 10 reps    Shoulder Shrugs Limitations 2# with UT stretch    Shoulder Flexion 20 reps    Shoulder Flexion Weights (lbs) 2    Shoulder ABduction 20 reps    Shoulder Abduction Weights (lbs) 2      Electrical Stimulation   Electrical Stimulation Location CT junc    Electrical Stimulation Action IFC    Electrical Stimulation Parameters sitting    Electrical Stimulation Goals Pain                    PT Short Term Goals - 06/08/20 1219      PT SHORT TERM GOAL #1   Title Pt will be I with initial HEP    Status Achieved             PT Long Term Goals - 05/25/20 1715      PT LONG TERM GOAL #1   Title Pt will demo cervical ROM WFL with </= 1/10 cervical pain    Time 6    Period Weeks    Status New    Target Date 07/06/20      PT LONG TERM GOAL #2   Title Pt will report 50% reduction in cervical pain    Time 6    Period Weeks    Status New    Target Date 07/06/20  PT LONG TERM GOAL #3   Title Pt will be I with advanced HEP    Time 6    Period Weeks    Status New    Target Date 07/06/20                 Plan -  06/11/20 1048    Clinical Impression Statement Pt reporting improved cervical pain overall this rx. Frequent cuing to avoid shoulder elevation with exercise. Difficulty with isolating cervical movement for neck retraction exercise. Better results with ball behind head for tactile cuing. Estim for pain relief.    PT Treatment/Interventions ADLs/Self Care Home Management;Electrical Stimulation;Iontophoresis 4mg /ml Dexamethasone;Moist Heat;Neuromuscular re-education;Therapeutic exercise;Therapeutic activities;Patient/family education;Manual techniques;Passive range of motion    PT Next Visit Plan assess and progress    Consulted and Agree with Plan of Care Patient           Patient will benefit from skilled therapeutic intervention in order to improve the following deficits and impairments:  Decreased range of motion,Increased muscle spasms,Impaired UE functional use,Pain,Impaired flexibility,Postural dysfunction  Visit Diagnosis: Cramp and spasm  Neck pain  Muscle weakness (generalized)     Problem List Patient Active Problem List   Diagnosis Date Noted  . PCP NOTES >>>>>>>>>>>>>>>>>>> 09/16/2015  . Multiple thyroid nodules 03/30/2015  . Annual physical exam 08/07/2014  . Bloody discharge from nipple - s/p surgery, resolved  12/29/2013  . Breast mass, right 12/11/2013  . At high risk for falls 12/11/2013  . Renal cysts, acquired, bilateral--per urology 05/04/2013  . Elevated LFTs 04/01/2013  . Cervical spinal stenosis 11/21/2012  . Nondiabetic gastroparesis 08/20/2012  . Hypothyroidism 08/08/2012  . Gastroparesis 05/06/2012  . Muscle cramp 01/19/2012  . Fatigue 01/14/2012  . Neurogenic bladder, prolapse ,h/o urinary retention, self cath, sees urology 11/05/2011  . Fecal incontinence 10/03/2011  . Osteoporosis 07/24/2011  . Adenoma of cecum 07/17/2011  . Chronic constipation 07/17/2011  . Skin lesion 07/08/2011  . Chronic neck pain 04/16/2011  . Rectocele 04/16/2011  .  Hypersomnia-- on dextroamphetamine 04/16/2011  . Vitamin D deficiency 04/16/2011  . Family history of colon cancer 04/16/2011  . Abnormal blood chemistry 04/16/2011  . Anemia   . COPD (chronic obstructive pulmonary disease) gold stage B   . Depression   . GERD (gastroesophageal reflux disease)   . Hyperlipidemia   . Hypertension   . Allergic rhinitis   . Pulmonary embolism-- in the 22s   . Skin cancer   . Seizures ([place])   . CIDP (chronic inflammatory demyelinating polyneuropathy) , Ataxia, Imbalance -- f/u at [facility], PT, DPT Donald Prose Ashtynn Berke 06/11/2020, 10:50 AM  [place]. [place], [place], [address] Phone: [phone]   Fax:  [phone]  Name: [name] MRN: [id number] Date of Birth: 1932-07-07

## 2020-06-15 ENCOUNTER — Ambulatory Visit (INDEPENDENT_AMBULATORY_CARE_PROVIDER_SITE_OTHER): Payer: Medicare Other | Admitting: Nurse Practitioner

## 2020-06-15 ENCOUNTER — Other Ambulatory Visit: Payer: Self-pay

## 2020-06-15 ENCOUNTER — Ambulatory Visit: Payer: Medicare Other | Admitting: Physical Therapy

## 2020-06-15 ENCOUNTER — Encounter: Payer: Self-pay | Admitting: Nurse Practitioner

## 2020-06-15 VITALS — BP 110/70 | HR 90 | Temp 96.9°F | Ht 63.0 in | Wt 125.6 lb

## 2020-06-15 DIAGNOSIS — L298 Other pruritus: Secondary | ICD-10-CM | POA: Diagnosis not present

## 2020-06-15 DIAGNOSIS — M6281 Muscle weakness (generalized): Secondary | ICD-10-CM

## 2020-06-15 DIAGNOSIS — R252 Cramp and spasm: Secondary | ICD-10-CM | POA: Diagnosis not present

## 2020-06-15 DIAGNOSIS — M542 Cervicalgia: Secondary | ICD-10-CM

## 2020-06-15 LAB — BASIC METABOLIC PANEL
BUN: 25 mg/dL — ABNORMAL HIGH (ref 6–23)
CO2: 28 mEq/L (ref 19–32)
Calcium: 9.6 mg/dL (ref 8.4–10.5)
Chloride: 105 mEq/L (ref 96–112)
Creatinine, Ser: 0.97 mg/dL (ref 0.40–1.20)
GFR: 53.17 mL/min — ABNORMAL LOW (ref >60.00)
Glucose, Bld: 80 mg/dL (ref 70–99)
Potassium: 4.4 mEq/L (ref 3.5–5.1)
Sodium: 139 mEq/L (ref 135–145)

## 2020-06-15 LAB — CBC WITH DIFFERENTIAL/PLATELET
Basophils Absolute: 0 10*3/uL (ref 0.0–0.1)
Basophils Relative: 0.6 % (ref 0.0–3.0)
Eosinophils Absolute: 0.2 10*3/uL (ref 0.0–0.7)
Eosinophils Relative: 3.2 % (ref 0.0–5.0)
HCT: 42.1 % (ref 36.0–46.0)
Hemoglobin: 14.3 g/dL (ref 12.0–15.0)
Lymphocytes Relative: 16.9 % (ref 12.0–46.0)
Lymphs Abs: 1.3 10*3/uL (ref 0.7–4.0)
MCHC: 33.9 g/dL (ref 30.0–36.0)
MCV: 89 fl (ref 78.0–100.0)
Monocytes Absolute: 0.4 10*3/uL (ref 0.1–1.0)
Monocytes Relative: 5.4 % (ref 3.0–12.0)
Neutro Abs: 5.5 10*3/uL (ref 1.4–7.7)
Neutrophils Relative %: 73.9 % (ref 43.0–77.0)
Platelets: 273 10*3/uL (ref 150.0–400.0)
RBC: 4.73 Mil/uL (ref 3.87–5.11)
RDW: 13.2 % (ref 11.5–15.5)
WBC: 7.5 10*3/uL (ref 4.0–10.5)

## 2020-06-15 LAB — SEDIMENTATION RATE: Sed Rate: 9 mm/hr (ref 0–30)

## 2020-06-15 LAB — C-REACTIVE PROTEIN: CRP: <1 mg/dL (ref 0.5–20.0)

## 2020-06-15 MED ORDER — CEPHALEXIN 500 MG PO CAPS: 500.0000 mg | ORAL_CAPSULE | Freq: Four times a day (QID) | ORAL | 0 refills | Status: DC

## 2020-06-15 NOTE — Therapy (Signed)
[place]. [place], [place], [address] Phone: [phone]   Fax:  [phone]  Physical Therapy Treatment  Patient Details  Name: [name] MRN: [id number] Date of Birth: [birth year] Referring Provider (PT): Cirigliano   Encounter Date: 06/15/2020   PT End of Session - 06/15/20 1213    Visit Number 4    Date for PT Re-Evaluation 07/26/20    PT Start Time 1140    PT Stop Time 1230    PT Time Calculation (min) 50 min           Past Medical History:  Diagnosis Date  . Allergic rhinitis   . Anemia   . Arthritis   . Asthma -COPD   . Blood transfusion 1957  . Breast mass    right breast in milk duct, bx neg  . Chronic fatigue syndrome   . Chronic inflammatory demyelinating polyneuropathy ([place]) 03/2011  . chronic sinusitis 08/08/2012  . Colon polyp   . Constipation   . COPD (chronic obstructive pulmonary disease) ([place])   . Cystocele 09/16/2012  . GERD (gastroesophageal reflux disease)   . Hyperlipidemia   . Hypertension   . Hypothyroid   . Neurogenic bladder 2013   husband caths pt, manages with timed void  . Osteoporosis   . Pulmonary embolism ([place])    1980s  . Renal cyst    Non-complex, contrast MRI 2013 with L>R non-complex cysts, dominant 3.7 left lower pole  . Seizures ([place])    1990 last seizures on meds Phenobarb  . Shingles late 91's  . Skin cancer    squamous cell  . Thyroid nodule    Right thyroid lobe, only seen on Sagittal imaging measures 2.3 cm in craniocaudal dimension and appears stable  . Tubular adenoma   . Vitamin D deficiency     Past Surgical History:  Procedure Laterality Date  . ABDOMINAL HYSTERECTOMY    . ANTERIOR FUSION CLIVUS-C2 EXTRAORAL W/ ODONTOID EXCISION  8/14  . APPENDECTOMY    . BASAL CELL CARCINOMA EXCISION     face  . BLADDER SUSPENSION    . BREAST DUCTAL SYSTEM EXCISION Right 01/15/2014   Procedure: EXCISION DUCTAL SYSTEM RIGHT BREAST;  Surgeon:  Stark Klein, MD;  Location: [facility];  Service: General;  Laterality: Right;  . BREAST EXCISIONAL BIOPSY Right   . CARPAL TUNNEL RELEASE     right  . CATARACT EXTRACTION     bilateral  . cervical neck ablation     x 7, C3-C6/3 screws and plate  . CESAREAN SECTION    . CYSTOCELE REPAIR    . DILATION AND CURETTAGE OF UTERUS    . duptyren's contracture right hand    . FINGER ARTHRODESIS Right 05/06/2015   Procedure: RIGHT RING PROXIMAL INTERPHALANGEAL FUSION (PIP);  Surgeon: Leanora Cover, MD;  Location: [facility];  Service: Orthopedics;  Laterality: Right;  . FINGER GANGLION CYST EXCISION     right  . HEMICOLECTOMY Right   . JOINT REPLACEMENT     right and left basal joints of thumbs  . left finger fusion     3 fingers on left hand/one finger right  . left ovary and tube removed    . NASAL POLYP SURGERY     4 sinus surgeries  . NASAL SEPTUM SURGERY    . PANNICULECTOMY    . PROXIMAL INTERPHALANGEAL FUSION (PIP) Left 09/09/2013   Procedure: FUSION LEFT INDEX PROXIMAL INTERPHALANGEAL JOINT (PIP);  Surgeon: Cammie Sickle., MD;  Location: [facility];  Service: Orthopedics;  Laterality: Left;  . right median nerve decompression    . SHOULDER ARTHROSCOPY     x2 left, 1 right  . sinus surgeries     x 4  . squamous lesions removed     neck and face  . STERIOD INJECTION Right 05/06/2015   Procedure: STEROID INJECTION;  Surgeon: Leanora Cover, MD;  Location: [facility];  Service: Orthopedics;  Laterality: Right;  Right Index Finger Proximal InterPhalangeal Joint Injection  . TONSILLECTOMY AND ADENOIDECTOMY    . TUBAL LIGATION    . vocal polyps removed      There were no vitals filed for this visit.   Subjective Assessment - 06/15/20 1148    Subjective amb in with shld more relaxed - verb pain patch on and it helps. pt with BIL distal LE redness and pain- advised to see MD    Currently in Pain? Yes    Pain Score 2      Pain Location Neck    Pain Orientation Right;Left                             [facility] Adult PT Treatment/Exercise - 06/15/20 0001      Neck Exercises: Machines for Strengthening   UBE (Upper Arm Bike) L1 3 min each fwd and back    Cybex Row 15# 2 sets 10    Lat Pull 15# 2 sets 10      Neck Exercises: Theraband   Scapula Retraction 20 reps    Scapula Retraction Limitations red    Shoulder Extension 20 reps    Shoulder Extension Limitations red    Shoulder External Rotation 20 reps    Shoulder External Rotation Limitations red      Neck Exercises: Seated   Cervical Isometrics Flexion;Extension;Right rotation;Left rotation;Left lateral flexion;Right lateral flexion;3 secs;10 reps    Neck Retraction 20 reps;3 secs   into pillow   Shoulder Shrugs 15 reps   3#   Shoulder Flexion 20 reps;Both    Shoulder Flexion Weights (lbs) 2    Shoulder ABduction Both;20 reps;Weights    Shoulder Abduction Weights (lbs) 2    Other Seated Exercise shld roll backward 3# 15x      Moist Heat Therapy   Number Minutes Moist Heat 15 Minutes    Moist Heat Location Cervical;Shoulder      Electrical Stimulation   Electrical Stimulation Location CT junc    Electrical Stimulation Action IFC    Electrical Stimulation Parameters sitting    Electrical Stimulation Goals Pain                    PT Short Term Goals - 06/08/20 1219      PT SHORT TERM GOAL #1   Title Pt will be I with initial HEP    Status Achieved             PT Long Term Goals - 05/25/20 1715      PT LONG TERM GOAL #1   Title Pt will demo cervical ROM WFL with </= 1/10 cervical pain    Time 6    Period Weeks    Status New    Target Date 07/06/20      PT LONG TERM GOAL #2   Title Pt will report 50% reduction in cervical pain    Time 6  Period Weeks    Status New    Target Date 07/06/20      PT LONG TERM GOAL #3   Title Pt will be I with advanced HEP    Time 6    Period Weeks    Status New     Target Date 07/06/20                 Plan - 06/15/20 1213    Clinical Impression Statement progressed ex and she tolerated well, less cuing needed for postural correction. pt seeing benefits of MH and estim    PT Treatment/Interventions ADLs/Self Care Home Management;Electrical Stimulation;Iontophoresis 4mg /ml Dexamethasone;Moist Heat;Neuromuscular re-education;Therapeutic exercise;Therapeutic activities;Patient/family education;Manual techniques;Passive range of motion    PT Next Visit Plan progress and check goals           Patient will benefit from skilled therapeutic intervention in order to improve the following deficits and impairments:  Decreased range of motion,Increased muscle spasms,Impaired UE functional use,Pain,Impaired flexibility,Postural dysfunction  Visit Diagnosis: Neck pain  Muscle weakness (generalized)  Cramp and spasm     Problem List Patient Active Problem List   Diagnosis Date Noted  . PCP NOTES >>>>>>>>>>>>>>>>>>> 09/16/2015  . Multiple thyroid nodules 03/30/2015  . Annual physical exam 08/07/2014  . Bloody discharge from nipple - s/p surgery, resolved  12/29/2013  . Breast mass, right 12/11/2013  . At high risk for falls 12/11/2013  . Renal cysts, acquired, bilateral--per urology 05/04/2013  . Elevated LFTs 04/01/2013  . Cervical spinal stenosis 11/21/2012  . Nondiabetic gastroparesis 08/20/2012  . Hypothyroidism 08/08/2012  . Gastroparesis 05/06/2012  . Muscle cramp 01/19/2012  . Fatigue 01/14/2012  . Neurogenic bladder, prolapse ,h/o urinary retention, self cath, sees urology 11/05/2011  . Fecal incontinence 10/03/2011  . Osteoporosis 07/24/2011  . Adenoma of cecum 07/17/2011  . Chronic constipation 07/17/2011  . Skin lesion 07/08/2011  . Chronic neck pain 04/16/2011  . Rectocele 04/16/2011  . Hypersomnia-- on dextroamphetamine 04/16/2011  . Vitamin D deficiency 04/16/2011  . Family history of colon cancer 04/16/2011  .  Abnormal blood chemistry 04/16/2011  . Anemia   . COPD (chronic obstructive pulmonary disease) gold stage B   . Depression   . GERD (gastroesophageal reflux disease)   . Hyperlipidemia   . Hypertension   . Allergic rhinitis   . Pulmonary embolism-- in the 61s   . Skin cancer   . Seizures ([place])   . CIDP (chronic inflammatory demyelinating polyneuropathy) , Ataxia, Imbalance -- f/u at [facility] PTA 06/15/2020, 12:16 PM  [place]. [place], [place], [address] Phone: [phone]   Fax:  [phone]  Name: [name] MRN: [id number] Date of Birth: Feb 04, 1933

## 2020-06-15 NOTE — Progress Notes (Signed)
Subjective:  Patient ID: [name], female    DOB: [birth year]  Age: 87 y.o. MRN: [id number]  CC: Acute Visit (Pt c/o rash on both legs x2 days. Pt states rash is from knees down and states it is very itchy. Pt has tried cortisone cream on it to relieve the itching but that is all that they have tried. )  Accompanied by husband Rash This is a new problem. The current episode started in the past 7 days. The problem is unchanged. The affected locations include the left lower leg and right lower leg. The rash is characterized by burning, itchiness, redness, pain and swelling. She was exposed to nothing. Pertinent negatives include no anorexia, congestion, cough, diarrhea, eye pain, facial edema, fatigue, fever, joint pain, nail changes, rhinorrhea, shortness of breath, sore throat or vomiting. Past treatments include topical steroids. The treatment provided mild relief. There is no history of allergies, asthma, eczema or varicella.  no pets at home No change in personal products No new medications, no OTC medications  Reviewed past Medical, Social and Family history today.  Outpatient Medications Prior to Visit  Medication Sig Dispense Refill  . albuterol (PROVENTIL) (2.5 MG/3ML) 0.083% nebulizer solution USE 1 VIAL IN NEBULIZER 4 TIMES DAILY    . amLODipine (NORVASC) 10 MG tablet Take 1 tablet (10 mg total) by mouth daily. 90 tablet 3  . arformoterol (BROVANA) 15 MCG/2ML NEBU Take 2 mLs (15 mcg total) by nebulization 2 (two) times daily. 120 mL 11  . atorvastatin (LIPITOR) 40 MG tablet TAKE 1 TABLET BY MOUTH AT BEDTIME 90 tablet 3  . budesonide (PULMICORT) 0.25 MG/2ML nebulizer solution Take 2 mLs (0.25 mg total) by nebulization 2 (two) times daily. 60 mL 11  . Cholecalciferol 25 MCG (1000 UT) capsule Take by mouth.    . dextroamphetamine (DEXTROSTAT) 10 MG tablet Take 1 tablet (10 mg total) by mouth in the morning, at noon, in the evening, and at bedtime. 120 tablet 0  . famotidine  (PEPCID) 20 MG tablet Take 20 mg by mouth 2 (two) times daily.    Marland Kitchen gabapentin (NEURONTIN) 300 MG capsule Take 300 mg by mouth at bedtime.    Marland Kitchen levothyroxine (SYNTHROID) 25 MCG tablet Take 1 tablet (25 mcg total) by mouth daily with breakfast. 90 tablet 3  . mometasone (NASONEX) 50 MCG/ACT nasal spray PLACE 2 SPRAYS INTO THE NOSE DAILY 17 g 6  . montelukast (SINGULAIR) 10 MG tablet Take 1 tablet (10 mg total) by mouth at bedtime. 90 tablet 1  . mupirocin ointment (BACTROBAN) 2 % ADD ONE INCH OF OINTMENT TO 8 OUNCES OF SINUS RINSE BOTTLE DAILY    . NON FORMULARY Salon pas pain patches/ PRN    . PHENobarbital (LUMINAL) 32.4 MG tablet TAKE ONE (1) TABLET BY MOUTH EVERY DAY AT 6AM AND TAKE TWO TABLETS DAILY AT 8PM 180 tablet 3  . polyethylene glycol powder (GLYCOLAX/MIRALAX) powder Take 3/4 capful dissolved in at least 8 ounces water/juice once daily. May titrate as needed 1530 g 3  . predniSONE (DELTASONE) 20 MG tablet Take 1 tablet (20 mg total) by mouth daily. (Patient taking differently: Take 15 mg by mouth daily.) 30 tablet 5  . sodium chloride (OCEAN) 0.65 % nasal spray Place into the nose.    . TEGRETOL-XR 100 MG 12 hr tablet Take 500 mg by mouth daily.   1  . VENTOLIN HFA 108 (90 Base) MCG/ACT inhaler SMARTSIG:1 Puff(s) Via Inhaler Every 4 Hours PRN    .  Vitamin D, Ergocalciferol, (DRISDOL) 1.25 MG (50000 UT) CAPS capsule Take 1 capsule (50,000 Units total) by mouth every 7 (seven) days. 5 capsule 2   No facility-administered medications prior to visit.    ROS See HPI  Objective:  BP 110/70 (BP Location: Left Arm, Patient Position: Sitting, Cuff Size: Normal)   Pulse 90   Temp (!) 96.9 F (36.1 C) (Temporal)   Ht 5\' 3"  (1.6 m)   Wt 125 lb 9.6 oz (57 kg)   SpO2 95%   BMI 22.25 kg/m   Physical Exam Vitals reviewed.  Cardiovascular:     Rate and Rhythm: Normal rate.     Pulses: Normal pulses.  Pulmonary:     Effort: Pulmonary effort is normal.  Skin:    Findings: Erythema and  rash present. Rash is macular.       Neurological:     Mental Status: She is alert and oriented to person, place, and time.     Assessment & Plan:  This visit occurred during the SARS-CoV-2 public health emergency.  Safety protocols were in place, including screening questions prior to the visit, additional usage of staff PPE, and extensive cleaning of exam room while observing appropriate contact time as indicated for disinfecting solutions.   Meiko was seen today for acute visit.  Diagnoses and all orders for this visit:  Pruritic erythematous rash -     CBC w/Diff -     C-reactive protein -     Basic metabolic panel -     Sedimentation rate -     cephALEXin (KEFLEX) 500 MG capsule; Take 1 capsule (500 mg total) by mouth 4 (four) times daily.  Go to lab for blood draw Start keflex oral antibiotics. F/up with Dr. Loletha Grayer in 1week Use mosturizing cream and/or calamine lotion to soothe itching.  Problem List Items Addressed This Visit   None   Visit Diagnoses    Pruritic erythematous rash    -  Primary   Relevant Medications   cephALEXin (KEFLEX) 500 MG capsule   Other Relevant Orders   CBC w/Diff   C-reactive protein   Basic metabolic panel   Sedimentation rate      Follow-up: Return in about 1 week (around 06/22/2020) for with Dr. Loletha Grayer (Rash f/up).  Wilfred Lacy, NP

## 2020-06-15 NOTE — Patient Instructions (Signed)
Go to lab for blood draw Start keflex oral antibiotics. F/up with Dr. Loletha Grayer in 1week Use mosturizing cream and/or calamine lotion to soothe itching.

## 2020-06-17 DIAGNOSIS — L2089 Other atopic dermatitis: Secondary | ICD-10-CM | POA: Diagnosis not present

## 2020-06-17 DIAGNOSIS — L309 Dermatitis, unspecified: Secondary | ICD-10-CM | POA: Diagnosis not present

## 2020-06-18 ENCOUNTER — Other Ambulatory Visit: Payer: Self-pay

## 2020-06-18 ENCOUNTER — Ambulatory Visit: Payer: Medicare Other | Admitting: Physical Therapy

## 2020-06-18 ENCOUNTER — Encounter: Payer: Self-pay | Admitting: Physical Therapy

## 2020-06-18 DIAGNOSIS — M6281 Muscle weakness (generalized): Secondary | ICD-10-CM

## 2020-06-18 DIAGNOSIS — R252 Cramp and spasm: Secondary | ICD-10-CM | POA: Diagnosis not present

## 2020-06-18 DIAGNOSIS — M542 Cervicalgia: Secondary | ICD-10-CM

## 2020-06-18 NOTE — Therapy (Signed)
[place]. [place], [place], [address] Phone: [phone]   Fax:  [phone]  Physical Therapy Treatment  Patient Details  Name: [name] MRN: [id number] Date of Birth: 1932/10/28 Referring Provider (PT): Cirigliano   Encounter Date: 06/18/2020   PT End of Session - 06/18/20 1131    Visit Number 5    Date for PT Re-Evaluation 07/26/20    PT Start Time 1049    PT Stop Time 1140    PT Time Calculation (min) 51 min    Activity Tolerance Patient tolerated treatment well    Behavior During Therapy [facility] for tasks assessed/performed           Past Medical History:  Diagnosis Date  . Allergic rhinitis   . Anemia   . Arthritis   . Asthma -COPD   . Blood transfusion 1957  . Breast mass    right breast in milk duct, bx neg  . Chronic fatigue syndrome   . Chronic inflammatory demyelinating polyneuropathy ([place]) 03/2011  . chronic sinusitis 08/08/2012  . Colon polyp   . Constipation   . COPD (chronic obstructive pulmonary disease) ([place])   . Cystocele 09/16/2012  . GERD (gastroesophageal reflux disease)   . Hyperlipidemia   . Hypertension   . Hypothyroid   . Neurogenic bladder 2013   husband caths pt, manages with timed void  . Osteoporosis   . Pulmonary embolism ([place])    1980s  . Renal cyst    Non-complex, contrast MRI 2013 with L>R non-complex cysts, dominant 3.7 left lower pole  . Seizures ([place])    1990 last seizures on meds Phenobarb  . Shingles late 32's  . Skin cancer    squamous cell  . Thyroid nodule    Right thyroid lobe, only seen on Sagittal imaging measures 2.3 cm in craniocaudal dimension and appears stable  . Tubular adenoma   . Vitamin D deficiency     Past Surgical History:  Procedure Laterality Date  . ABDOMINAL HYSTERECTOMY    . ANTERIOR FUSION CLIVUS-C2 EXTRAORAL W/ ODONTOID EXCISION  8/14  . APPENDECTOMY    . BASAL CELL CARCINOMA EXCISION     face  . BLADDER  SUSPENSION    . BREAST DUCTAL SYSTEM EXCISION Right 01/15/2014   Procedure: EXCISION DUCTAL SYSTEM RIGHT BREAST;  Surgeon: Stark Klein, MD;  Location: [place];  Service: General;  Laterality: Right;  . BREAST EXCISIONAL BIOPSY Right   . CARPAL TUNNEL RELEASE     right  . CATARACT EXTRACTION     bilateral  . cervical neck ablation     x 7, C3-C6/3 screws and plate  . CESAREAN SECTION    . CYSTOCELE REPAIR    . DILATION AND CURETTAGE OF UTERUS    . duptyren's contracture right hand    . FINGER ARTHRODESIS Right 05/06/2015   Procedure: RIGHT RING PROXIMAL INTERPHALANGEAL FUSION (PIP);  Surgeon: Leanora Cover, MD;  Location: [place];  Service: Orthopedics;  Laterality: Right;  . FINGER GANGLION CYST EXCISION     right  . HEMICOLECTOMY Right   . JOINT REPLACEMENT     right and left basal joints of thumbs  . left finger fusion     3 fingers on left hand/one finger right  . left ovary and tube removed    . NASAL POLYP SURGERY     4 sinus surgeries  . NASAL SEPTUM SURGERY    . PANNICULECTOMY    .  PROXIMAL INTERPHALANGEAL FUSION (PIP) Left 09/09/2013   Procedure: FUSION LEFT INDEX PROXIMAL INTERPHALANGEAL JOINT (PIP);  Surgeon: Cammie Sickle., MD;  Location: [facility];  Service: Orthopedics;  Laterality: Left;  . right median nerve decompression    . SHOULDER ARTHROSCOPY     x2 left, 1 right  . sinus surgeries     x 4  . squamous lesions removed     neck and face  . STERIOD INJECTION Right 05/06/2015   Procedure: STEROID INJECTION;  Surgeon: Leanora Cover, MD;  Location: [place];  Service: Orthopedics;  Laterality: Right;  Right Index Finger Proximal InterPhalangeal Joint Injection  . TONSILLECTOMY AND ADENOIDECTOMY    . TUBAL LIGATION    . vocal polyps removed      There were no vitals filed for this visit.   Subjective Assessment - 06/18/20 1048    Subjective Pt reports neck is doing much better, states no  pain today. Pt saw MD and received medicine for distal LE redness/pain; states feeling much better.    Currently in Pain? No/denies    Pain Score 0-No pain    Pain Location Neck              [facility] PT Assessment - 06/18/20 0001      AROM   Cervical Flexion 35    Cervical Extension 35    Cervical - Right Side Bend 25    Cervical - Left Side Bend 25    Cervical - Right Rotation 50    Cervical - Left Rotation 50                         [facility] Adult PT Treatment/Exercise - 06/18/20 0001      Neck Exercises: Machines for Strengthening   UBE (Upper Arm Bike) L1.5 3 min each    Cybex Row 15# 2 sets 10    Lat Pull 15# 2 sets 10      Neck Exercises: Theraband   Scapula Retraction 20 reps    Scapula Retraction Limitations red    Shoulder Extension 20 reps    Shoulder Extension Limitations red    Shoulder External Rotation 20 reps    Shoulder External Rotation Limitations red      Neck Exercises: Seated   Cervical Isometrics Flexion;Extension;Right lateral flexion;Left lateral flexion;3 secs;10 reps    Neck Retraction 20 reps;3 secs   into pillow, verbal and tactile cuing   Shoulder Shrugs 15 reps    Shoulder Shrugs Limitations 2# with UT stretch    Shoulder Flexion 20 reps;Both    Shoulder Flexion Weights (lbs) 2    Shoulder ABduction Both;20 reps;Weights    Shoulder Abduction Weights (lbs) 2      Moist Heat Therapy   Number Minutes Moist Heat 15 Minutes    Moist Heat Location Cervical;Shoulder      Electrical Stimulation   Electrical Stimulation Location CT junc    Electrical Stimulation Action IFC    Electrical Stimulation Parameters sitting    Electrical Stimulation Goals Pain                    PT Short Term Goals - 06/08/20 1219      PT SHORT TERM GOAL #1   Title Pt will be I with initial HEP    Status Achieved             PT Long Term Goals - 06/18/20 1126  PT LONG TERM GOAL #1   Title Pt will demo cervical ROM WFL with  </= 1/10 cervical pain    Baseline see cervical ROM measurements (improved all directions), 0/10 pain    Time 6    Period Weeks    Status Partially Met      PT LONG TERM GOAL #2   Title Pt will report 50% reduction in cervical pain    Baseline reports 75% better    Time 6    Period Weeks    Status Achieved      PT LONG TERM GOAL #3   Title Pt will be I with advanced HEP    Time 6    Period Weeks    Status On-going                 Plan - 06/18/20 1131    Clinical Impression Statement Pt making progress towards goals; reports 75% improved cervical pain. States that she no longer needs to use pain patches on neck or take medicine to control pain. Pt demos improved cervical AROM in all directions, still demos significant tightness in B UT. Pt requires frequent verbal and tactile cuing for form with ex's; difficulty isolating cervical motion for neck retraction without cuing. Pt mentioned increasing pain in B hands and difficulty performing ADLs d/t hand pain; states she has been to PT in past for hand therapy but has never seen OT. Coordinated with OT today and pt will contact MD for OT eval referral. Follow up with this process next rx.    Comorbidities asthma, COPD, HTN, polyneuropathy, epilepsy    PT Treatment/Interventions ADLs/Self Care Home Management;Electrical Stimulation;Iontophoresis 29m/ml Dexamethasone;Moist Heat;Neuromuscular re-education;Therapeutic exercise;Therapeutic activities;Patient/family education;Manual techniques;Passive range of motion    PT Next Visit Plan cervical flexibility, manual to UT, cervical AROM, strengthening    Consulted and Agree with Plan of Care Patient           Patient will benefit from skilled therapeutic intervention in order to improve the following deficits and impairments:  Decreased range of motion,Increased muscle spasms,Impaired UE functional use,Pain,Impaired flexibility,Postural dysfunction  Visit Diagnosis: Neck  pain  Muscle weakness (generalized)  Cramp and spasm     Problem List Patient Active Problem List   Diagnosis Date Noted  . PCP NOTES >>>>>>>>>>>>>>>>>>> 09/16/2015  . Multiple thyroid nodules 03/30/2015  . Annual physical exam 08/07/2014  . Bloody discharge from nipple - s/p surgery, resolved  12/29/2013  . Breast mass, right 12/11/2013  . At high risk for falls 12/11/2013  . Renal cysts, acquired, bilateral--per urology 05/04/2013  . Elevated LFTs 04/01/2013  . Cervical spinal stenosis 11/21/2012  . Nondiabetic gastroparesis 08/20/2012  . Hypothyroidism 08/08/2012  . Gastroparesis 05/06/2012  . Muscle cramp 01/19/2012  . Fatigue 01/14/2012  . Neurogenic bladder, prolapse ,h/o urinary retention, self cath, sees urology 11/05/2011  . Fecal incontinence 10/03/2011  . Osteoporosis 07/24/2011  . Adenoma of cecum 07/17/2011  . Chronic constipation 07/17/2011  . Skin lesion 07/08/2011  . Chronic neck pain 04/16/2011  . Rectocele 04/16/2011  . Hypersomnia-- on dextroamphetamine 04/16/2011  . Vitamin D deficiency 04/16/2011  . Family history of colon cancer 04/16/2011  . Abnormal blood chemistry 04/16/2011  . Anemia   . COPD (chronic obstructive pulmonary disease) gold stage B   . Depression   . GERD (gastroesophageal reflux disease)   . Hyperlipidemia   . Hypertension   . Allergic rhinitis   . Pulmonary embolism-- in the 851s  . Skin cancer   .  Seizures ([place])   . CIDP (chronic inflammatory demyelinating polyneuropathy) , Ataxia, Imbalance -- f/u at [facility], PT, DPT Donald Prose Eleanora Guinyard 06/18/2020, 11:35 AM  [place]. [place], [place], [address] Phone: [phone]   Fax:  [phone]  Name: [name] MRN: [id number] Date of Birth: 12/16/1932

## 2020-06-21 ENCOUNTER — Encounter: Payer: Self-pay | Admitting: Physical Therapy

## 2020-06-21 ENCOUNTER — Other Ambulatory Visit: Payer: Self-pay

## 2020-06-21 ENCOUNTER — Ambulatory Visit: Payer: Medicare Other | Admitting: Physical Therapy

## 2020-06-21 ENCOUNTER — Encounter: Payer: Self-pay | Admitting: Family Medicine

## 2020-06-21 DIAGNOSIS — R252 Cramp and spasm: Secondary | ICD-10-CM | POA: Diagnosis not present

## 2020-06-21 DIAGNOSIS — M6281 Muscle weakness (generalized): Secondary | ICD-10-CM

## 2020-06-21 DIAGNOSIS — M542 Cervicalgia: Secondary | ICD-10-CM

## 2020-06-21 NOTE — Therapy (Signed)
[place]. [place], [place], [address] Phone: [phone]   Fax:  [phone]  Physical Therapy Treatment  Patient Details  Name: [name] MRN: [id number] Date of Birth: 05/17/1933 Referring Provider (PT): Cirigliano   Encounter Date: 06/21/2020   PT End of Session - 06/21/20 1227    Visit Number 6    Date for PT Re-Evaluation 07/26/20    PT Start Time 1245    PT Stop Time 1236    PT Time Calculation (min) 51 min    Activity Tolerance Patient tolerated treatment well    Behavior During Therapy [facility] for tasks assessed/performed           Past Medical History:  Diagnosis Date  . Allergic rhinitis   . Anemia   . Arthritis   . Asthma -COPD   . Blood transfusion 1957  . Breast mass    right breast in milk duct, bx neg  . Chronic fatigue syndrome   . Chronic inflammatory demyelinating polyneuropathy ([place]) 03/2011  . chronic sinusitis 08/08/2012  . Colon polyp   . Constipation   . COPD (chronic obstructive pulmonary disease) ([place])   . Cystocele 09/16/2012  . GERD (gastroesophageal reflux disease)   . Hyperlipidemia   . Hypertension   . Hypothyroid   . Neurogenic bladder 2013   husband caths pt, manages with timed void  . Osteoporosis   . Pulmonary embolism ([place])    1980s  . Renal cyst    Non-complex, contrast MRI 2013 with L>R non-complex cysts, dominant 3.7 left lower pole  . Seizures ([place])    1990 last seizures on meds Phenobarb  . Shingles late 84's  . Skin cancer    squamous cell  . Thyroid nodule    Right thyroid lobe, only seen on Sagittal imaging measures 2.3 cm in craniocaudal dimension and appears stable  . Tubular adenoma   . Vitamin D deficiency     Past Surgical History:  Procedure Laterality Date  . ABDOMINAL HYSTERECTOMY    . ANTERIOR FUSION CLIVUS-C2 EXTRAORAL W/ ODONTOID EXCISION  8/14  . APPENDECTOMY    . BASAL CELL CARCINOMA EXCISION     face  . BLADDER  SUSPENSION    . BREAST DUCTAL SYSTEM EXCISION Right 01/15/2014   Procedure: EXCISION DUCTAL SYSTEM RIGHT BREAST;  Surgeon: Stark Klein, MD;  Location: [place];  Service: General;  Laterality: Right;  . BREAST EXCISIONAL BIOPSY Right   . CARPAL TUNNEL RELEASE     right  . CATARACT EXTRACTION     bilateral  . cervical neck ablation     x 7, C3-C6/3 screws and plate  . CESAREAN SECTION    . CYSTOCELE REPAIR    . DILATION AND CURETTAGE OF UTERUS    . duptyren's contracture right hand    . FINGER ARTHRODESIS Right 05/06/2015   Procedure: RIGHT RING PROXIMAL INTERPHALANGEAL FUSION (PIP);  Surgeon: Leanora Cover, MD;  Location: [place];  Service: Orthopedics;  Laterality: Right;  . FINGER GANGLION CYST EXCISION     right  . HEMICOLECTOMY Right   . JOINT REPLACEMENT     right and left basal joints of thumbs  . left finger fusion     3 fingers on left hand/one finger right  . left ovary and tube removed    . NASAL POLYP SURGERY     4 sinus surgeries  . NASAL SEPTUM SURGERY    . PANNICULECTOMY    .  PROXIMAL INTERPHALANGEAL FUSION (PIP) Left 09/09/2013   Procedure: FUSION LEFT INDEX PROXIMAL INTERPHALANGEAL JOINT (PIP);  Surgeon: Cammie Sickle., MD;  Location: [facility];  Service: Orthopedics;  Laterality: Left;  . right median nerve decompression    . SHOULDER ARTHROSCOPY     x2 left, 1 right  . sinus surgeries     x 4  . squamous lesions removed     neck and face  . STERIOD INJECTION Right 05/06/2015   Procedure: STEROID INJECTION;  Surgeon: Leanora Cover, MD;  Location: [place];  Service: Orthopedics;  Laterality: Right;  Right Index Finger Proximal InterPhalangeal Joint Injection  . TONSILLECTOMY AND ADENOIDECTOMY    . TUBAL LIGATION    . vocal polyps removed      There were no vitals filed for this visit.   Subjective Assessment - 06/21/20 1148    Subjective Pt reports that she is doing good    Currently in  Pain? No/denies                             [facility] Adult PT Treatment/Exercise - 06/21/20 0001      Neck Exercises: Machines for Strengthening   UBE (Upper Arm Bike) L1.5 3 min each    Cybex Row 15# 2 sets 10    Lat Pull 15# 2 sets 10      Neck Exercises: Theraband   Scapula Retraction 20 reps    Scapula Retraction Limitations red    Shoulder Extension 20 reps    Shoulder Extension Limitations red    Shoulder External Rotation 20 reps    Shoulder External Rotation Limitations red      Neck Exercises: Seated   Cervical Isometrics Flexion;Extension;Right lateral flexion;Left lateral flexion;3 secs;10 reps    Neck Retraction 20 reps;3 secs    Shoulder Shrugs 15 reps    Shoulder Flexion 20 reps;Both    Shoulder Flexion Weights (lbs) 2    Shoulder ABduction Both;20 reps;Weights    Shoulder Abduction Weights (lbs) 2      Moist Heat Therapy   Number Minutes Moist Heat 13 Minutes    Moist Heat Location Cervical;Shoulder      Electrical Stimulation   Electrical Stimulation Location CT junc    Electrical Stimulation Action FC    Electrical Stimulation Parameters sitting    Electrical Stimulation Goals Pain                    PT Short Term Goals - 06/08/20 1219      PT SHORT TERM GOAL #1   Title Pt will be I with initial HEP    Status Achieved             PT Long Term Goals - 06/18/20 1126      PT LONG TERM GOAL #1   Title Pt will demo cervical ROM WFL with </= 1/10 cervical pain    Baseline see cervical ROM measurements (improved all directions), 0/10 pain    Time 6    Period Weeks    Status Partially Met      PT LONG TERM GOAL #2   Title Pt will report 50% reduction in cervical pain    Baseline reports 75% better    Time 6    Period Weeks    Status Achieved      PT LONG TERM GOAL #3   Title Pt will be I with  advanced HEP    Time 6    Period Weeks    Status On-going                 Plan - 06/21/20 1228    Clinical  Impression Statement Pt enters clinic reporting improvement overall. She reports no pain with activity. Cues to keep arms to her side with external rotations. Tactile cues to her back to prevent postural leaning with stated rows.    Personal Factors and Comorbidities Comorbidity 3+    Comorbidities asthma, COPD, HTN, polyneuropathy, epilepsy    Examination-Activity Limitations Reach Overhead;Lift    Examination-Participation Restrictions Community Activity;Cleaning;Meal Prep;Interpersonal Relationship    Stability/Clinical Decision Making Stable/Uncomplicated    Rehab Potential Good    PT Frequency 2x / week    PT Duration 6 weeks    PT Treatment/Interventions ADLs/Self Care Home Management;Electrical Stimulation;Iontophoresis 69m/ml Dexamethasone;Moist Heat;Neuromuscular re-education;Therapeutic exercise;Therapeutic activities;Patient/family education;Manual techniques;Passive range of motion    PT Next Visit Plan cervical flexibility, manual to UT, cervical AROM, strengthening           Patient will benefit from skilled therapeutic intervention in order to improve the following deficits and impairments:  Decreased range of motion,Increased muscle spasms,Impaired UE functional use,Pain,Impaired flexibility,Postural dysfunction  Visit Diagnosis: Muscle weakness (generalized)  Neck pain  Cramp and spasm     Problem List Patient Active Problem List   Diagnosis Date Noted  . PCP NOTES >>>>>>>>>>>>>>>>>>> 09/16/2015  . Multiple thyroid nodules 03/30/2015  . Annual physical exam 08/07/2014  . Bloody discharge from nipple - s/p surgery, resolved  12/29/2013  . Breast mass, right 12/11/2013  . At high risk for falls 12/11/2013  . Renal cysts, acquired, bilateral--per urology 05/04/2013  . Elevated LFTs 04/01/2013  . Cervical spinal stenosis 11/21/2012  . Nondiabetic gastroparesis 08/20/2012  . Hypothyroidism 08/08/2012  . Gastroparesis 05/06/2012  . Muscle cramp 01/19/2012  .  Fatigue 01/14/2012  . Neurogenic bladder, prolapse ,h/o urinary retention, self cath, sees urology 11/05/2011  . Fecal incontinence 10/03/2011  . Osteoporosis 07/24/2011  . Adenoma of cecum 07/17/2011  . Chronic constipation 07/17/2011  . Skin lesion 07/08/2011  . Chronic neck pain 04/16/2011  . Rectocele 04/16/2011  . Hypersomnia-- on dextroamphetamine 04/16/2011  . Vitamin D deficiency 04/16/2011  . Family history of colon cancer 04/16/2011  . Abnormal blood chemistry 04/16/2011  . Anemia   . COPD (chronic obstructive pulmonary disease) gold stage B   . Depression   . GERD (gastroesophageal reflux disease)   . Hyperlipidemia   . Hypertension   . Allergic rhinitis   . Pulmonary embolism-- in the 86s  . Skin cancer   . [place]   . CIDP (chronic inflammatory demyelinating polyneuropathy) , Ataxia, Imbalance -- f/u at DBeatrice [facility]echer PTA 06/21/2020, 12:32 PM  [place] [address]Phone: [phone]  Fax:  [phone] Name: BAlfreda HammadMRN: [id number] of Birth: [birth year]

## 2020-06-23 ENCOUNTER — Encounter: Payer: Self-pay | Admitting: Family Medicine

## 2020-06-23 ENCOUNTER — Ambulatory Visit (INDEPENDENT_AMBULATORY_CARE_PROVIDER_SITE_OTHER): Payer: Medicare Other | Admitting: Family Medicine

## 2020-06-23 ENCOUNTER — Other Ambulatory Visit: Payer: Self-pay

## 2020-06-23 VITALS — BP 120/76 | HR 84 | Temp 97.4°F | Ht 63.0 in | Wt 122.4 lb

## 2020-06-23 DIAGNOSIS — L209 Atopic dermatitis, unspecified: Secondary | ICD-10-CM | POA: Diagnosis not present

## 2020-06-23 NOTE — Progress Notes (Signed)
[name] is a 87 y.o. female  Chief Complaint  Patient presents with  . Follow-up    1 week f/u rash.       HPI: [name] is a 87 y.o. female seen 1 week ago by my colleague Wilfred Lacy for a rash on B/L lower legs. She was Rx'd keflex QID x 7 days She is here today for 1 week f/u appt.  She saw derm Dr. Orbie Hurst w/ [facility] on 06/17/20 and was dx'd with flare of atopic dermatitis. She was told to complete abx and was Rx'd triamcinolone 0.1% cream to use BID. She was also given IM kenalog 60mg  x 1 in office.   Today she reports symptoms have improved/resolved. She is using the triamcinolone BID and using moisturizer 3-4x/day.   Neuro increased her tegretol due to increased leg pain/cramps. This has improved her symptoms.  Past Medical History:  Diagnosis Date  . Allergic rhinitis   . Anemia   . Arthritis   . Asthma -COPD   . Blood transfusion 1957  . Breast mass    right breast in milk duct, bx neg  . Chronic fatigue syndrome   . Chronic inflammatory demyelinating polyneuropathy ([place]) 03/2011  . chronic sinusitis 08/08/2012  . Colon polyp   . Constipation   . COPD (chronic obstructive pulmonary disease) ([place])   . Cystocele 09/16/2012  . GERD (gastroesophageal reflux disease)   . Hyperlipidemia   . Hypertension   . Hypothyroid   . Neurogenic bladder 2013   husband caths pt, manages with timed void  . Osteoporosis   . Pulmonary embolism ([place])    1980s  . Renal cyst    Non-complex, contrast MRI 2013 with L>R non-complex cysts, dominant 3.7 left lower pole  . Seizures ([place])    1990 last seizures on meds Phenobarb  . Shingles late 28's  . Skin cancer    squamous cell  . Thyroid nodule    Right thyroid lobe, only seen on Sagittal imaging measures 2.3 cm in craniocaudal dimension and appears stable  . Tubular adenoma   . Vitamin D deficiency     Past Surgical History:  Procedure Laterality Date  . ABDOMINAL HYSTERECTOMY     . ANTERIOR FUSION CLIVUS-C2 EXTRAORAL W/ ODONTOID EXCISION  8/14  . APPENDECTOMY    . BASAL CELL CARCINOMA EXCISION     face  . BLADDER SUSPENSION    . BREAST DUCTAL SYSTEM EXCISION Right 01/15/2014   Procedure: EXCISION DUCTAL SYSTEM RIGHT BREAST;  Surgeon: Stark Klein, MD;  Location: [facility];  Service: General;  Laterality: Right;  . BREAST EXCISIONAL BIOPSY Right   . CARPAL TUNNEL RELEASE     right  . CATARACT EXTRACTION     bilateral  . cervical neck ablation     x 7, C3-C6/3 screws and plate  . CESAREAN SECTION    . CYSTOCELE REPAIR    . DILATION AND CURETTAGE OF UTERUS    . duptyren's contracture right hand    . FINGER ARTHRODESIS Right 05/06/2015   Procedure: RIGHT RING PROXIMAL INTERPHALANGEAL FUSION (PIP);  Surgeon: Leanora Cover, MD;  Location: [facility];  Service: Orthopedics;  Laterality: Right;  . FINGER GANGLION CYST EXCISION     right  . HEMICOLECTOMY Right   . JOINT REPLACEMENT     right and left basal joints of thumbs  . left finger fusion     3 fingers on left hand/one finger right  .  left ovary and tube removed    . NASAL POLYP SURGERY     4 sinus surgeries  . NASAL SEPTUM SURGERY    . PANNICULECTOMY    . PROXIMAL INTERPHALANGEAL FUSION (PIP) Left 09/09/2013   Procedure: FUSION LEFT INDEX PROXIMAL INTERPHALANGEAL JOINT (PIP);  Surgeon: Cammie Sickle., MD;  Location: [facility];  Service: Orthopedics;  Laterality: Left;  . right median nerve decompression    . SHOULDER ARTHROSCOPY     x2 left, 1 right  . sinus surgeries     x 4  . squamous lesions removed     neck and face  . STERIOD INJECTION Right 05/06/2015   Procedure: STEROID INJECTION;  Surgeon: Leanora Cover, MD;  Location: [facility];  Service: Orthopedics;  Laterality: Right;  Right Index Finger Proximal InterPhalangeal Joint Injection  . TONSILLECTOMY AND ADENOIDECTOMY    . TUBAL LIGATION    . vocal polyps removed      Social  History   Socioeconomic History  . Marital status: Married    Spouse name: Not on file  . Number of children: 3  . Years of education: Not on file  . Highest education level: Not on file  Occupational History  . Occupation: retired, Licensed conveyancer    Employer: RETIRED  Tobacco Use  . Smoking status: Former Smoker    Packs/day: 0.50    Years: 3.00    Pack years: 1.50    Types: Cigarettes    Quit date: 05/29/1978    Years since quitting: 42.0  . Smokeless tobacco: Never Used  . Tobacco comment: quit smoking 35 years ago  Substance and Sexual Activity  . Alcohol use: No  . Drug use: No  . Sexual activity: Yes  Other Topics Concern  . Not on file  Social History Narrative   Pt lives at home with her husband. She is originally from [place]. Moved to [place] in 2005.     3 grown children - Son is a Pharmacist, community in the area, son in [place], daughter in [place]   Pt is a retired Marine scientist   Social Determinants of Radio broadcast assistant Strain: [place]   . Difficulty of Paying Living Expenses: Not hard at all  Food Insecurity: No Food Insecurity  . Worried About Charity fundraiser in the Last Year: Never true  . Ran Out of Food in the Last Year: Never true  Transportation Needs: No Transportation Needs  . Lack of Transportation (Medical): No  . Lack of Transportation (Non-Medical): No  Physical Activity: Insufficiently Active  . Days of Exercise per Week: 2 days  . Minutes of Exercise per Session: 10 min  Stress: No Stress Concern Present  . Feeling of Stress : Not at all  Social Connections: Moderately Integrated  . Frequency of Communication with Friends and Family: More than three times a week  . Frequency of Social Gatherings with Friends and Family: Once a week  . Attends Religious Services: Never  . Active Member of Clubs or Organizations: Yes  . Attends Archivist Meetings: 1 to 4 times per year  . Marital Status: Married  Human resources officer Violence: Not At Risk   . Fear of Current or Ex-Partner: No  . Emotionally Abused: No  . Physically Abused: No  . Sexually Abused: No    Family History  Problem Relation Age of Onset  . Heart disease Mother   . Hyperlipidemia Mother  M, aunt  . Ovarian cancer Sister   . Lung cancer Sister        lung - stage 1  . Thyroid cancer Sister   . Colon cancer Father        30  . Malignant hyperthermia Cousin   . Stomach cancer Other        first cousin  . Skin cancer Daughter   . Breast cancer Neg Hx      Immunization History  Administered Date(s) Administered  . Fluad Quad(high Dose 65+) 03/13/2019  . Influenza Split 12/27/2012  . Influenza Whole 01/28/2011  . Influenza,inj,Quad PF,6+ Mos 03/16/2014, 02/17/2015  . Influenza-Unspecified 02/12/2012, 02/05/2015, 03/02/2020  . PFIZER(Purple Top)SARS-COV-2 Vaccination 06/17/2019, 07/08/2019, 03/02/2020  . Pneumococcal Conjugate-13 12/11/2013  . Pneumococcal Polysaccharide-23 05/29/2006  . Tdap 04/11/2011, 10/14/2018  . Zoster 04/14/2014  . Zoster Recombinat (Shingrix) 05/01/2019, 07/30/2019    Outpatient Encounter Medications as of 06/23/2020  Medication Sig  . albuterol (PROVENTIL) (2.5 MG/3ML) 0.083% nebulizer solution USE 1 VIAL IN NEBULIZER 4 TIMES DAILY  . amLODipine (NORVASC) 10 MG tablet Take 1 tablet (10 mg total) by mouth daily.  Marland Kitchen arformoterol (BROVANA) 15 MCG/2ML NEBU Take 2 mLs (15 mcg total) by nebulization 2 (two) times daily.  Marland Kitchen atorvastatin (LIPITOR) 40 MG tablet TAKE 1 TABLET BY MOUTH AT BEDTIME  . budesonide (PULMICORT) 0.25 MG/2ML nebulizer solution Take 2 mLs (0.25 mg total) by nebulization 2 (two) times daily.  . cephALEXin (KEFLEX) 500 MG capsule Take 1 capsule (500 mg total) by mouth 4 (four) times daily.  . Cholecalciferol 25 MCG (1000 UT) capsule Take by mouth.  . dextroamphetamine (DEXTROSTAT) 10 MG tablet Take 1 tablet (10 mg total) by mouth in the morning, at noon, in the evening, and at bedtime.  . famotidine (PEPCID)  20 MG tablet Take 20 mg by mouth 2 (two) times daily.  Marland Kitchen gabapentin (NEURONTIN) 300 MG capsule Take 300 mg by mouth at bedtime.  Marland Kitchen levothyroxine (SYNTHROID) 25 MCG tablet Take 1 tablet (25 mcg total) by mouth daily with breakfast.  . mometasone (NASONEX) 50 MCG/ACT nasal spray PLACE 2 SPRAYS INTO THE NOSE DAILY  . montelukast (SINGULAIR) 10 MG tablet Take 1 tablet (10 mg total) by mouth at bedtime.  . mupirocin ointment (BACTROBAN) 2 % ADD ONE INCH OF OINTMENT TO 8 OUNCES OF SINUS RINSE BOTTLE DAILY  . NON FORMULARY Salon pas pain patches/ PRN  . PHENobarbital (LUMINAL) 32.4 MG tablet TAKE ONE (1) TABLET BY MOUTH EVERY DAY AT 6AM AND TAKE TWO TABLETS DAILY AT 8PM  . polyethylene glycol powder (GLYCOLAX/MIRALAX) powder Take 3/4 capful dissolved in at least 8 ounces water/juice once daily. May titrate as needed  . predniSONE (DELTASONE) 20 MG tablet Take 1 tablet (20 mg total) by mouth daily. (Patient taking differently: Take 15 mg by mouth daily.)  . sodium chloride (OCEAN) 0.65 % nasal spray Place into the nose.  . TEGRETOL-XR 100 MG 12 hr tablet Take 500 mg by mouth daily. Takes 2 tabs at night  . VENTOLIN HFA 108 (90 Base) MCG/ACT inhaler SMARTSIG:1 Puff(s) Via Inhaler Every 4 Hours PRN  . Vitamin D, Ergocalciferol, (DRISDOL) 1.25 MG (50000 UT) CAPS capsule Take 1 capsule (50,000 Units total) by mouth every 7 (seven) days.  Marland Kitchen triamcinolone (KENALOG) 0.1 % Apply topically 2 (two) times daily.   No facility-administered encounter medications on file as of 06/23/2020.     ROS: Pertinent positives and negatives noted in HPI. Remainder of ROS non-contributory  Allergies  Allergen Reactions  . Iodinated Diagnostic Agents Anaphylaxis    Cardiac arrest  . Iodine Other (See Comments)    Cardiac arrest As young adult, received iodinated contrast for IVP, went into cardiac arrest, was resuscitated and hospitalized. Has not received iodinated contrast since that episode. Reports no adverse  reaction to betadine.   . Morphine Other (See Comments)    Cardiac arrest Patient has previously tolerated hydrocodone, hydromorphone and fentanyl   . Morphine And Related Other (See Comments)    Cardiac arrest  . Metrizamide Other (See Comments)    Cardiac arrest Cardiac arrest     BP 120/76   Pulse 84   Temp (!) 97.4 F (36.3 C) (Temporal)   Ht 5\' 3"  (1.6 m)   Wt 122 lb 6.4 oz (55.5 kg)   SpO2 98%   BMI 21.68 kg/m   Physical Exam Constitutional:      General: She is not in acute distress.    Appearance: Normal appearance. She is not ill-appearing.  Pulmonary:     Effort: No respiratory distress.  Skin:    General: Skin is dry.     Findings: No erythema or rash.  Neurological:     Mental Status: She is alert and oriented to person, place, and time. Mental status is at baseline.  Psychiatric:        Mood and Affect: Mood normal.        Behavior: Behavior normal.      A/P:  1. Atopic dermatitis, unspecified type - resolved with 60mg  kenalog IM x 1 and using topical trimacinolone BID and regular moisturizing lotion - sees Dr. Tamala Julian [facility]) - also completed 7 day course of keflex  This visit occurred during the SARS-CoV-2 public health emergency.  Safety protocols were in place, including screening questions prior to the visit, additional usage of staff PPE, and extensive cleaning of exam room while observing appropriate contact time as indicated for disinfecting solutions.

## 2020-06-24 ENCOUNTER — Encounter: Payer: Self-pay | Admitting: *Deleted

## 2020-06-24 ENCOUNTER — Ambulatory Visit: Payer: Medicare Other | Admitting: Physical Therapy

## 2020-06-27 ENCOUNTER — Other Ambulatory Visit: Payer: Self-pay | Admitting: Family Medicine

## 2020-06-27 DIAGNOSIS — G4710 Hypersomnia, unspecified: Secondary | ICD-10-CM

## 2020-06-29 ENCOUNTER — Ambulatory Visit: Payer: Medicare Other | Attending: Psychiatry | Admitting: Physical Therapy

## 2020-06-29 ENCOUNTER — Encounter: Payer: Self-pay | Admitting: Physical Therapy

## 2020-06-29 ENCOUNTER — Other Ambulatory Visit: Payer: Self-pay

## 2020-06-29 DIAGNOSIS — M6281 Muscle weakness (generalized): Secondary | ICD-10-CM | POA: Diagnosis not present

## 2020-06-29 DIAGNOSIS — R252 Cramp and spasm: Secondary | ICD-10-CM | POA: Insufficient documentation

## 2020-06-29 DIAGNOSIS — M542 Cervicalgia: Secondary | ICD-10-CM | POA: Diagnosis not present

## 2020-06-29 DIAGNOSIS — R2689 Other abnormalities of gait and mobility: Secondary | ICD-10-CM | POA: Diagnosis not present

## 2020-06-29 NOTE — Therapy (Signed)
[place] [place], [place], [address] Phone: [phone]   Fax:  [phone]  Physical Therapy Treatment  Patient Details  Name: [name] MRN: [id number] Date of Birth: 03/22/1933 Referring Provider (PT): Cirigliano   Encounter Date: 06/29/2020   PT End of Session - 06/29/20 1220    Visit Number 7    Date for PT Re-Evaluation 07/26/20    PT Start Time 0102    PT Stop Time 1230    PT Time Calculation (min) 45 min    Activity Tolerance Patient tolerated treatment well    Behavior During Therapy [facility] for tasks assessed/performed           Past Medical History:  Diagnosis Date  . Allergic rhinitis   . Anemia   . Arthritis   . Asthma -COPD   . Blood transfusion 1957  . Breast mass    right breast in milk duct, bx neg  . Chronic fatigue syndrome   . Chronic inflammatory demyelinating polyneuropathy ([place]) 03/2011  . chronic sinusitis 08/08/2012  . Colon polyp   . Constipation   . COPD (chronic obstructive pulmonary disease) ([place])   . Cystocele 09/16/2012  . GERD (gastroesophageal reflux disease)   . Hyperlipidemia   . Hypertension   . Hypothyroid   . Neurogenic bladder 2013   husband caths pt, manages with timed void  . Osteoporosis   . Pulmonary embolism ([place])    1980s  . Raynaud phenomenon   . Renal cyst    Non-complex, contrast MRI 2013 with L>R non-complex cysts, dominant 3.7 left lower pole  . Seizures ([place])    1990 last seizures on meds Phenobarb  . Shingles late 54's  . Skin cancer    squamous cell  . Thyroid nodule    Right thyroid lobe, only seen on Sagittal imaging measures 2.3 cm in craniocaudal dimension and appears stable  . Tubular adenoma   . Vitamin D deficiency     Past Surgical History:  Procedure Laterality Date  . ABDOMINAL HYSTERECTOMY    . ANTERIOR FUSION CLIVUS-C2 EXTRAORAL W/ ODONTOID EXCISION  8/14  . APPENDECTOMY    . BASAL CELL CARCINOMA EXCISION      face  . BLADDER SUSPENSION    . BREAST DUCTAL SYSTEM EXCISION Right 01/15/2014   Procedure: EXCISION DUCTAL SYSTEM RIGHT BREAST;  Surgeon: Stark Klein, MD;  Location: [facility];  Service: General;  Laterality: Right;  . BREAST EXCISIONAL BIOPSY Right   . CARPAL TUNNEL RELEASE     right  . CATARACT EXTRACTION     bilateral  . cervical neck ablation     x 7, C3-C6/3 screws and plate  . CESAREAN SECTION    . CYSTOCELE REPAIR    . DILATION AND CURETTAGE OF UTERUS    . duptyren's contracture right hand    . FINGER ARTHRODESIS Right 05/06/2015   Procedure: RIGHT RING PROXIMAL INTERPHALANGEAL FUSION (PIP);  Surgeon: Leanora Cover, MD;  Location: [facility];  Service: Orthopedics;  Laterality: Right;  . FINGER GANGLION CYST EXCISION     right  . HEMICOLECTOMY Right   . JOINT REPLACEMENT     right and left basal joints of thumbs  . left finger fusion     3 fingers on left hand/one finger right  . left ovary and tube removed    . NASAL POLYP SURGERY     4 sinus surgeries  . NASAL SEPTUM SURGERY    .  PANNICULECTOMY    . PROXIMAL INTERPHALANGEAL FUSION (PIP) Left 09/09/2013   Procedure: FUSION LEFT INDEX PROXIMAL INTERPHALANGEAL JOINT (PIP);  Surgeon: Cammie Sickle., MD;  Location: [facility];  Service: Orthopedics;  Laterality: Left;  . right median nerve decompression    . SHOULDER ARTHROSCOPY     x2 left, 1 right  . sinus surgeries     x 4  . squamous lesions removed     neck and face  . STERIOD INJECTION Right 05/06/2015   Procedure: STEROID INJECTION;  Surgeon: Leanora Cover, MD;  Location: [facility];  Service: Orthopedics;  Laterality: Right;  Right Index Finger Proximal InterPhalangeal Joint Injection  . TONSILLECTOMY AND ADENOIDECTOMY    . TUBAL LIGATION    . vocal polyps removed      There were no vitals filed for this visit.   Subjective Assessment - 06/29/20 1148    Subjective feeling fine, did have 2 days of  neck pain three days after last session    Currently in Pain? No/denies                             [facility] Adult PT Treatment/Exercise - 06/29/20 0001      Neck Exercises: Machines for Strengthening   UBE (Upper Arm Bike) L1.5 3 min each    Cybex Row 15# 2 sets 10    Lat Pull 15# 2 sets 10      Neck Exercises: Seated   Cervical Isometrics Flexion;Extension;Right lateral flexion;Left lateral flexion;3 secs;10 reps    Neck Retraction 20 reps;3 secs    Shoulder Flexion 20 reps;Both    Shoulder Flexion Weights (lbs) 1    Shoulder ABduction Both;20 reps;Weights    Shoulder Abduction Limitations 1    Other Seated Exercise Shrugs 4lb 2x10      Moist Heat Therapy   Number Minutes Moist Heat 13 Minutes    Moist Heat Location Cervical;Shoulder      Electrical Stimulation   Electrical Stimulation Location CT junc    Electrical Stimulation Action IFC    Electrical Stimulation Parameters sitting    Electrical Stimulation Goals Pain                    PT Short Term Goals - 06/08/20 1219      PT SHORT TERM GOAL #1   Title Pt will be I with initial HEP    Status Achieved             PT Long Term Goals - 06/18/20 1126      PT LONG TERM GOAL #1   Title Pt will demo cervical ROM WFL with </= 1/10 cervical pain    Baseline see cervical ROM measurements (improved all directions), 0/10 pain    Time 6    Period Weeks    Status Partially Met      PT LONG TERM GOAL #2   Title Pt will report 50% reduction in cervical pain    Baseline reports 75% better    Time 6    Period Weeks    Status Achieved      PT LONG TERM GOAL #3   Title Pt will be I with advanced HEP    Time 6    Period Weeks    Status On-going                 Plan - 06/29/20 1221  Clinical Impression Statement Lighter resistance selected with seated shoulder flexion and abduction due to some reports of recent pain. Again cues needed to prevent trunk extension with seated rows.  She reports  crunching sensation with cervical rotation to her L    Personal Factors and Comorbidities Comorbidity 3+    Comorbidities asthma, COPD, HTN, polyneuropathy, epilepsy    Examination-Activity Limitations Reach Overhead;Lift    Examination-Participation Restrictions Community Activity;Cleaning;Meal Prep;Interpersonal Relationship    Stability/Clinical Decision Making Stable/Uncomplicated    Rehab Potential Good    PT Frequency 2x / week    PT Duration 6 weeks    PT Treatment/Interventions ADLs/Self Care Home Management;Electrical Stimulation;Iontophoresis 67m/ml Dexamethasone;Moist Heat;Neuromuscular re-education;Therapeutic exercise;Therapeutic activities;Patient/family education;Manual techniques;Passive range of motion    PT Next Visit Plan cervical flexibility, manual to UT, cervical AROM, strengthening           Patient will benefit from skilled therapeutic intervention in order to improve the following deficits and impairments:  Decreased range of motion,Increased muscle spasms,Impaired UE functional use,Pain,Impaired flexibility,Postural dysfunction  Visit Diagnosis: Neck pain  Cramp and spasm  Muscle weakness (generalized)     Problem List Patient Active Problem List   Diagnosis Date Noted  . PCP NOTES >>>>>>>>>>>>>>>>>>> 09/16/2015  . Multiple thyroid nodules 03/30/2015  . Annual physical exam 08/07/2014  . Bloody discharge from nipple - s/p surgery, resolved  12/29/2013  . Breast mass, right 12/11/2013  . At high risk for falls 12/11/2013  . Renal cysts, acquired, bilateral--per urology 05/04/2013  . Elevated LFTs 04/01/2013  . Cervical spinal stenosis 11/21/2012  . Nondiabetic gastroparesis 08/20/2012  . Hypothyroidism 08/08/2012  . Gastroparesis 05/06/2012  . Muscle cramp 01/19/2012  . Fatigue 01/14/2012  . Neurogenic bladder, prolapse ,h/o urinary retention, self cath, sees urology 11/05/2011  . Fecal incontinence 10/03/2011  . Osteoporosis  07/24/2011  . Adenoma of cecum 07/17/2011  . Chronic constipation 07/17/2011  . Skin lesion 07/08/2011  . Chronic neck pain 04/16/2011  . Rectocele 04/16/2011  . Hypersomnia-- on dextroamphetamine 04/16/2011  . Vitamin D deficiency 04/16/2011  . Family history of colon cancer 04/16/2011  . Abnormal blood chemistry 04/16/2011  . Anemia   . COPD (chronic obstructive pulmonary disease) gold stage B   . Depression   . GERD (gastroesophageal reflux disease)   . Hyperlipidemia   . Hypertension   . Allergic rhinitis   . Pulmonary embolism-- in the 821s  . Skin cancer   . [place]   . CIDP (chronic inflammatory demyelinating polyneuropathy) , Ataxia, Imbalance -- f/u at D[facility] PTA 06/29/2020, 12:24 PM  [place] [address]Phone: [phone]  Fax:  [phone] Name: Julia BerquistMRN: [id number] of Birth: 4Apr 22, 1934

## 2020-06-30 ENCOUNTER — Ambulatory Visit (INDEPENDENT_AMBULATORY_CARE_PROVIDER_SITE_OTHER): Payer: Medicare Other | Admitting: Internal Medicine

## 2020-06-30 ENCOUNTER — Encounter: Payer: Self-pay | Admitting: Internal Medicine

## 2020-06-30 VITALS — BP 126/82 | HR 82 | Ht 62.0 in | Wt 124.0 lb

## 2020-06-30 DIAGNOSIS — K5909 Other constipation: Secondary | ICD-10-CM

## 2020-06-30 DIAGNOSIS — M6289 Other specified disorders of muscle: Secondary | ICD-10-CM

## 2020-06-30 NOTE — Patient Instructions (Signed)
Use Miralax daily. Call our office if this is unhelpful.  If you are age 85 or older, your body mass index should be between 23-30. Your Body mass index is 22.68 kg/m. If this is out of the aforementioned range listed, please consider follow up with your Primary Care Provider.  If you are age 55 or younger, your body mass index should be between 19-25. Your Body mass index is 22.68 kg/m. If this is out of the aformentioned range listed, please consider follow up with your Primary Care Provider.   Due to recent changes in healthcare laws, you may see the results of your imaging and laboratory studies on MyChart before your provider has had a chance to review them.  We understand that in some cases there may be results that are confusing or concerning to you. Not all laboratory results come back in the same time frame and the provider may be waiting for multiple results in order to interpret others.  Please give [place] 48 hours in order for your provider to thoroughly review all the results before contacting the office for clarification of your results.

## 2020-06-30 NOTE — Progress Notes (Signed)
Subjective:    Patient ID: [name], female    DOB: [birth year], 87 y.o.   MRN: [id number]  HPI [name] is an 87 year old female with a past medical history of large right-sided colon polyp status post right hemicolectomy in 2013, chronic constipation with pelvic floor dysfunction, GERD, history of gastroparesis, history of fundic gland polyp with low-grade dysplasia status post resection, CIDP, COPD, hypertension, hyperlipidemia and polyarthritis who is here for follow-up.  She is here with her husband today and was last seen in December 2019.  They report that she has had intermittent issues with very hard and large stool.  On 2 occasions in the last year she had a fecal impaction requiring her husband to perform manual disimpaction.  They report that this is occurred when she has become less adherent with MiraLAX therapy.  When she does have constipation she will have left-sided abdominal pain relieved with bowel movement.  She was previously taking MiraLAX once every other day and occasional stool softeners.  The dose of MiraLAX they are using is about a tablespoon.  Occasionally she will go 2 days without MiraLAX.  She is not having abdominal pain outside of when she is most constipated.  Appetite has been good.  No rectal bleeding or melena.  She does wear an adult diaper but has not had fecal accidents of late.  No upper GI or hepatobiliary complaint today.  He reports that she can be reluctant to drink water drinking mostly Coke or Gatorade.   Review of Systems As per HPI, otherwise negative  Current Medications, Allergies, Past Medical History, Past Surgical History, Family History and Social History were reviewed in [employer] record.     Objective:   Physical Exam BP 126/82   Pulse 82   Ht 5\' 2"  (1.575 m)   Wt 124 lb (56.2 kg)   BMI 22.68 kg/m  Gen: awake, alert, NAD HEENT: anicteric  CV: RRR, no mrg Pulm: CTA b/l Abd: soft, NT/ND,  +BS throughout Ext: no c/c/e Neuro: nonfocal  CBC    Component Value Date/Time   WBC 7.5 06/15/2020 1314   RBC 4.73 06/15/2020 1314   HGB 14.3 06/15/2020 1314   HGB 13.5 10/27/2013 1532   HCT 42.1 06/15/2020 1314   HCT 40.2 10/27/2013 1532   PLT 273.0 06/15/2020 1314   PLT 267 10/27/2013 1532   MCV 89.0 06/15/2020 1314   MCV 90 10/27/2013 1532   MCH 30.1 10/27/2013 1532   MCH 29.6 12/06/2012 1909   MCHC 33.9 06/15/2020 1314   RDW 13.2 06/15/2020 1314   RDW 13.5 10/27/2013 1532   LYMPHSABS 1.3 06/15/2020 1314   LYMPHSABS 2.0 10/27/2013 1532   MONOABS 0.4 06/15/2020 1314   EOSABS 0.2 06/15/2020 1314   EOSABS 0.2 10/27/2013 1532   BASOSABS 0.0 06/15/2020 1314   BASOSABS 0.0 10/27/2013 1532   CMP     Component Value Date/Time   NA 139 06/15/2020 1314   K 4.4 06/15/2020 1314   CL 105 06/15/2020 1314   CO2 28 06/15/2020 1314   GLUCOSE 80 06/15/2020 1314   BUN 25 (H) 06/15/2020 1314   CREATININE 0.97 06/15/2020 1314   CREATININE 0.78 06/27/2012 1139   CALCIUM 9.6 06/15/2020 1314   PROT 7.2 04/06/2014 0954   ALBUMIN 3.5 04/06/2014 0954   AST 26 05/05/2020 1143   ALT 16 05/05/2020 1143   ALKPHOS 75 04/06/2014 0954   BILITOT 0.6 04/06/2014 0954   GFRNONAA >60 05/03/2015 1345  GFRAA >60 05/03/2015 1345       Assessment & Plan:  87 year old female with a past medical history of large right-sided colon polyp status post right hemicolectomy in 2013, chronic constipation with pelvic floor dysfunction, GERD, history of gastroparesis, history of fundic gland polyp with low-grade dysplasia status post resection, CIDP, COPD, hypertension, hyperlipidemia and polyarthritis who is here for follow-up.  1.  Chronic constipation and pelvic floor dysfunction --her neuropathy and pelvic floor dysfunction certainly do not help with her chronic constipation and she also has lack of normal rectal tone and sensation exacerbating this problem.  She has not always been consistent with MiraLAX  and when she is not she has had issues with fecal impaction and left-sided abdominal pain.  We discussed this again today.  I think she would do better with daily MiraLAX --Increase MiraLAX to 1 tablespoon daily, if stools too loose then reduce to half dose daily --Stool softener can be used if needed though more frequent and daily MiraLAX may reduce the need for stool softener.  2.  GERD --rarely symptomatic, can use famotidine as needed  3.  History of gastric and colonic polyps --last colonoscopy was 2016; further screening and surveillance deferred due to age  103 minutes total spent today including patient facing time, coordination of care, reviewing medical history/procedures/pertinent radiology studies, and documentation of the encounter.

## 2020-07-02 ENCOUNTER — Encounter: Payer: Self-pay | Admitting: Physical Therapy

## 2020-07-02 ENCOUNTER — Other Ambulatory Visit: Payer: Self-pay

## 2020-07-02 ENCOUNTER — Ambulatory Visit: Payer: Medicare Other | Admitting: Physical Therapy

## 2020-07-02 DIAGNOSIS — M542 Cervicalgia: Secondary | ICD-10-CM

## 2020-07-02 DIAGNOSIS — M6281 Muscle weakness (generalized): Secondary | ICD-10-CM

## 2020-07-02 DIAGNOSIS — R252 Cramp and spasm: Secondary | ICD-10-CM

## 2020-07-02 DIAGNOSIS — R2689 Other abnormalities of gait and mobility: Secondary | ICD-10-CM

## 2020-07-02 NOTE — Therapy (Signed)
[address] [place], [place], [address] Phone: [phone]   Fax:  [phone]  Physical Therapy Treatment  Patient Details  Name: [name] MRN: [id number] Date of Birth: 09-11-1932 Referring Provider (PT): Cirigliano   Encounter Date: 07/02/2020   PT End of Session - 07/02/20 1141    Visit Number 8    Date for PT Re-Evaluation 07/26/20    PT Start Time 1100    PT Stop Time 1149    PT Time Calculation (min) 49 min    Activity Tolerance Patient tolerated treatment well    Behavior During Therapy [facility] for tasks assessed/performed           Past Medical History:  Diagnosis Date  . Allergic rhinitis   . Anemia   . Arthritis   . Asthma -COPD   . Blood transfusion 1957  . Breast mass    right breast in milk duct, bx neg  . Chronic fatigue syndrome   . Chronic inflammatory demyelinating polyneuropathy ([place]) 03/2011  . chronic sinusitis 08/08/2012  . Colon polyp   . Constipation   . COPD (chronic obstructive pulmonary disease) ([place])   . Cystocele 09/16/2012  . GERD (gastroesophageal reflux disease)   . Hyperlipidemia   . Hypertension   . Hypothyroid   . Neurogenic bladder 2013   husband caths pt, manages with timed void  . Osteoporosis   . Pulmonary embolism ([place])    1980s  . Raynaud phenomenon   . Renal cyst    Non-complex, contrast MRI 2013 with L>R non-complex cysts, dominant 3.7 left lower pole  . Seizures ([place])    1990 last seizures on meds Phenobarb  . Shingles late 73's  . Skin cancer    squamous cell  . Thyroid nodule    Right thyroid lobe, only seen on Sagittal imaging measures 2.3 cm in craniocaudal dimension and appears stable  . Tubular adenoma   . Vitamin D deficiency     Past Surgical History:  Procedure Laterality Date  . ABDOMINAL HYSTERECTOMY    . ANTERIOR FUSION CLIVUS-C2 EXTRAORAL W/ ODONTOID EXCISION  8/14  . APPENDECTOMY    . BASAL CELL CARCINOMA EXCISION      face  . BLADDER SUSPENSION    . BREAST DUCTAL SYSTEM EXCISION Right 01/15/2014   Procedure: EXCISION DUCTAL SYSTEM RIGHT BREAST;  Surgeon: Stark Klein, MD;  Location: [place];  Service: General;  Laterality: Right;  . BREAST EXCISIONAL BIOPSY Right   . CARPAL TUNNEL RELEASE     right  . CATARACT EXTRACTION     bilateral  . cervical neck ablation     x 7, C3-C6/3 screws and plate  . CESAREAN SECTION    . CYSTOCELE REPAIR    . DILATION AND CURETTAGE OF UTERUS    . duptyren's contracture right hand    . FINGER ARTHRODESIS Right 05/06/2015   Procedure: RIGHT RING PROXIMAL INTERPHALANGEAL FUSION (PIP);  Surgeon: Leanora Cover, MD;  Location: [place];  Service: Orthopedics;  Laterality: Right;  . FINGER GANGLION CYST EXCISION     right  . HEMICOLECTOMY Right   . JOINT REPLACEMENT     right and left basal joints of thumbs  . left finger fusion     3 fingers on left hand/one finger right  . left ovary and tube removed    . NASAL POLYP SURGERY     4 sinus surgeries  . NASAL SEPTUM SURGERY    .  PANNICULECTOMY    . PROXIMAL INTERPHALANGEAL FUSION (PIP) Left 09/09/2013   Procedure: FUSION LEFT INDEX PROXIMAL INTERPHALANGEAL JOINT (PIP);  Surgeon: Cammie Sickle., MD;  Location: [facility];  Service: Orthopedics;  Laterality: Left;  . right median nerve decompression    . SHOULDER ARTHROSCOPY     x2 left, 1 right  . sinus surgeries     x 4  . squamous lesions removed     neck and face  . STERIOD INJECTION Right 05/06/2015   Procedure: STEROID INJECTION;  Surgeon: Leanora Cover, MD;  Location: [place];  Service: Orthopedics;  Laterality: Right;  Right Index Finger Proximal InterPhalangeal Joint Injection  . TONSILLECTOMY AND ADENOIDECTOMY    . TUBAL LIGATION    . vocal polyps removed      There were no vitals filed for this visit.   Subjective Assessment - 07/02/20 1106    Subjective Pt reports no pain today. States  she has only had to use 2 pain patches since starting therapy    Currently in Pain? No/denies    Pain Score 0-No pain    Pain Location Neck                             [facility] Adult PT Treatment/Exercise - 07/02/20 0001      Neck Exercises: Machines for Strengthening   UBE (Upper Arm Bike) L1.5 3 min each    Cybex Row 15# 2 sets 10    Lat Pull 15# 2 sets 10      Neck Exercises: Seated   Neck Retraction 20 reps;3 secs    Shoulder Flexion 20 reps;Both    Shoulder Flexion Weights (lbs) 1    Shoulder ABduction Both;20 reps;Weights    Shoulder Abduction Weights (lbs) 2      Moist Heat Therapy   Number Minutes Moist Heat 10 Minutes    Moist Heat Location Cervical;Shoulder      Electrical Stimulation   Electrical Stimulation Location CT junc    Electrical Stimulation Action IFC    Electrical Stimulation Parameters sitting    Electrical Stimulation Goals Pain                    PT Short Term Goals - 06/08/20 1219      PT SHORT TERM GOAL #1   Title Pt will be I with initial HEP    Status Achieved             PT Long Term Goals - 06/18/20 1126      PT LONG TERM GOAL #1   Title Pt will demo cervical ROM WFL with </= 1/10 cervical pain    Baseline see cervical ROM measurements (improved all directions), 0/10 pain    Time 6    Period Weeks    Status Partially Met      PT LONG TERM GOAL #2   Title Pt will report 50% reduction in cervical pain    Baseline reports 75% better    Time 6    Period Weeks    Status Achieved      PT LONG TERM GOAL #3   Title Pt will be I with advanced HEP    Time 6    Period Weeks    Status On-going                 Plan - 07/02/20 1141    Clinical  Impression Statement Pt presents to clinic with reports of improving symptoms overall. Has decreased need for use of pain patches and states decreased soreness/stiffness. Pt required cuing for form with seated rows and cues to prevent shoulder elevation during  exercise. Pt reports relief from tightness with heat and estim. Will message MD regarding potential occupational therapy referral, follow up next rx.    PT Treatment/Interventions ADLs/Self Care Home Management;Electrical Stimulation;Iontophoresis 74m/ml Dexamethasone;Moist Heat;Neuromuscular re-education;Therapeutic exercise;Therapeutic activities;Patient/family education;Manual techniques;Passive range of motion    PT Next Visit Plan cervical flexibility, manual to UT, cervical AROM, strengthening    Consulted and Agree with Plan of Care Patient           Patient will benefit from skilled therapeutic intervention in order to improve the following deficits and impairments:  Decreased range of motion,Increased muscle spasms,Impaired UE functional use,Pain,Impaired flexibility,Postural dysfunction  Visit Diagnosis: Neck pain  Cramp and spasm  Muscle weakness (generalized)  Other abnormalities of gait and mobility     Problem List Patient Active Problem List   Diagnosis Date Noted  . PCP NOTES >>>>>>>>>>>>>>>>>>> 09/16/2015  . Multiple thyroid nodules 03/30/2015  . Annual physical exam 08/07/2014  . Bloody discharge from nipple - s/p surgery, resolved  12/29/2013  . Breast mass, right 12/11/2013  . At high risk for falls 12/11/2013  . Renal cysts, acquired, bilateral--per urology 05/04/2013  . Elevated LFTs 04/01/2013  . Cervical spinal stenosis 11/21/2012  . Nondiabetic gastroparesis 08/20/2012  . Hypothyroidism 08/08/2012  . Gastroparesis 05/06/2012  . Muscle cramp 01/19/2012  . Fatigue 01/14/2012  . Neurogenic bladder, prolapse ,h/o urinary retention, self cath, sees urology 11/05/2011  . Fecal incontinence 10/03/2011  . Osteoporosis 07/24/2011  . Adenoma of cecum 07/17/2011  . Chronic constipation 07/17/2011  . Skin lesion 07/08/2011  . Chronic neck pain 04/16/2011  . Rectocele 04/16/2011  . Hypersomnia-- on dextroamphetamine 04/16/2011  . Vitamin D deficiency  04/16/2011  . Family history of colon cancer 04/16/2011  . Abnormal blood chemistry 04/16/2011  . Anemia   . COPD (chronic obstructive pulmonary disease) gold stage B   . Depression   . GERD (gastroesophageal reflux disease)   . Hyperlipidemia   . Hypertension   . Allergic rhinitis   . Pulmonary embolism-- in the 853s  . Skin cancer   . Seizures ([place]   . CIDP (chronic inflammatory demyelinating polyneuropathy) , Ataxia, Imbalance [facility], DL[facility]2/08/2020, 11:43 AM  [place] [address]Phone: [phone]  Fax:  [phone] Name: BDollye GlasserMRN: [id number] of Birth: 403-27-1934

## 2020-07-06 ENCOUNTER — Encounter: Payer: Self-pay | Admitting: Physical Therapy

## 2020-07-06 ENCOUNTER — Other Ambulatory Visit: Payer: Self-pay

## 2020-07-06 ENCOUNTER — Ambulatory Visit: Payer: Medicare Other | Admitting: Physical Therapy

## 2020-07-06 DIAGNOSIS — M6281 Muscle weakness (generalized): Secondary | ICD-10-CM

## 2020-07-06 DIAGNOSIS — R252 Cramp and spasm: Secondary | ICD-10-CM

## 2020-07-06 DIAGNOSIS — M542 Cervicalgia: Secondary | ICD-10-CM | POA: Diagnosis not present

## 2020-07-06 NOTE — Therapy (Signed)
[address] [place], [place], [address] Phone: [phone]   Fax:  [phone]  Physical Therapy Treatment  Patient Details  Name: [name] MRN: [id number] Date of Birth: 07-09-1932 Referring Provider (PT): Cirigliano   Encounter Date: 07/06/2020   PT End of Session - 07/06/20 1220    Visit Number 9    Date for PT Re-Evaluation 07/26/20    PT Start Time 4492    PT Stop Time 1230    PT Time Calculation (min) 45 min    Activity Tolerance Patient tolerated treatment well    Behavior During Therapy [facility] for tasks assessed/performed           Past Medical History:  Diagnosis Date  . Allergic rhinitis   . Anemia   . Arthritis   . Asthma -COPD   . Blood transfusion 1957  . Breast mass    right breast in milk duct, bx neg  . Chronic fatigue syndrome   . Chronic inflammatory demyelinating polyneuropathy ([place]) 03/2011  . chronic sinusitis 08/08/2012  . Colon polyp   . Constipation   . COPD (chronic obstructive pulmonary disease) ([place])   . Cystocele 09/16/2012  . GERD (gastroesophageal reflux disease)   . Hyperlipidemia   . Hypertension   . Hypothyroid   . Neurogenic bladder 2013   husband caths pt, manages with timed void  . Osteoporosis   . Pulmonary embolism ([place])    1980s  . Raynaud phenomenon   . Renal cyst    Non-complex, contrast MRI 2013 with L>R non-complex cysts, dominant 3.7 left lower pole  . Seizures ([place])    1990 last seizures on meds Phenobarb  . Shingles late 66's  . Skin cancer    squamous cell  . Thyroid nodule    Right thyroid lobe, only seen on Sagittal imaging measures 2.3 cm in craniocaudal dimension and appears stable  . Tubular adenoma   . Vitamin D deficiency     Past Surgical History:  Procedure Laterality Date  . ABDOMINAL HYSTERECTOMY    . ANTERIOR FUSION CLIVUS-C2 EXTRAORAL W/ ODONTOID EXCISION  8/14  . APPENDECTOMY    . BASAL CELL CARCINOMA EXCISION      face  . BLADDER SUSPENSION    . BREAST DUCTAL SYSTEM EXCISION Right 01/15/2014   Procedure: EXCISION DUCTAL SYSTEM RIGHT BREAST;  Surgeon: Stark Klein, MD;  Location: [place];  Service: General;  Laterality: Right;  . BREAST EXCISIONAL BIOPSY Right   . CARPAL TUNNEL RELEASE     right  . CATARACT EXTRACTION     bilateral  . cervical neck ablation     x 7, C3-C6/3 screws and plate  . CESAREAN SECTION    . CYSTOCELE REPAIR    . DILATION AND CURETTAGE OF UTERUS    . duptyren's contracture right hand    . FINGER ARTHRODESIS Right 05/06/2015   Procedure: RIGHT RING PROXIMAL INTERPHALANGEAL FUSION (PIP);  Surgeon: Leanora Cover, MD;  Location: [place];  Service: Orthopedics;  Laterality: Right;  . FINGER GANGLION CYST EXCISION     right  . HEMICOLECTOMY Right   . JOINT REPLACEMENT     right and left basal joints of thumbs  . left finger fusion     3 fingers on left hand/one finger right  . left ovary and tube removed    . NASAL POLYP SURGERY     4 sinus surgeries  . NASAL SEPTUM SURGERY    .  PANNICULECTOMY    . PROXIMAL INTERPHALANGEAL FUSION (PIP) Left 09/09/2013   Procedure: FUSION LEFT INDEX PROXIMAL INTERPHALANGEAL JOINT (PIP);  Surgeon: Cammie Sickle., MD;  Location: [facility];  Service: Orthopedics;  Laterality: Left;  . right median nerve decompression    . SHOULDER ARTHROSCOPY     x2 left, 1 right  . sinus surgeries     x 4  . squamous lesions removed     neck and face  . STERIOD INJECTION Right 05/06/2015   Procedure: STEROID INJECTION;  Surgeon: Leanora Cover, MD;  Location: [place];  Service: Orthopedics;  Laterality: Right;  Right Index Finger Proximal InterPhalangeal Joint Injection  . TONSILLECTOMY AND ADENOIDECTOMY    . TUBAL LIGATION    . vocal polyps removed      There were no vitals filed for this visit.   Subjective Assessment - 07/06/20 1151    Subjective "I am fine" has not taken any pain  med's or patch    Currently in Pain? No/denies                             [facility] Adult PT Treatment/Exercise - 07/06/20 0001      Neck Exercises: Machines for Strengthening   UBE (Upper Arm Bike) L1.5 3 min each    Cybex Row 15# 2 sets 12    Lat Pull 15lb 2x12      Neck Exercises: Seated   Neck Retraction 20 reps;3 secs    Shoulder Shrugs 20 reps    Shoulder Shrugs Limitations 3lb    Shoulder Flexion 20 reps    Shoulder Flexion Weights (lbs) 2    Shoulder ABduction Both;20 reps;Weights    Shoulder Abduction Weights (lbs) 2    Other Seated Exercise Rows & Ext red 2x10      Moist Heat Therapy   Number Minutes Moist Heat 11 Minutes    Moist Heat Location Cervical;Shoulder      Electrical Stimulation   Electrical Stimulation Location CT junc    Electrical Stimulation Action IFC    Electrical Stimulation Parameters sitting    Electrical Stimulation Goals Pain                    PT Short Term Goals - 06/08/20 1219      PT SHORT TERM GOAL #1   Title Pt will be I with initial HEP    Status Achieved             PT Long Term Goals - 06/18/20 1126      PT LONG TERM GOAL #1   Title Pt will demo cervical ROM WFL with </= 1/10 cervical pain    Baseline see cervical ROM measurements (improved all directions), 0/10 pain    Time 6    Period Weeks    Status Partially Met      PT LONG TERM GOAL #2   Title Pt will report 50% reduction in cervical pain    Baseline reports 75% better    Time 6    Period Weeks    Status Achieved      PT LONG TERM GOAL #3   Title Pt will be I with advanced HEP    Time 6    Period Weeks    Status On-going                 Plan - 07/06/20 1221  Clinical Impression Statement Pt continue report improvement overall with decrease pain. Tactile cues given to prevent postural swaying with cervical retractions. Added rows and extensions to treatment without issue. increase resistance tolerated with shoulder  flexion.    Personal Factors and Comorbidities Comorbidity 3+    Comorbidities asthma, COPD, HTN, polyneuropathy, epilepsy    Examination-Activity Limitations Reach Overhead;Lift    Examination-Participation Restrictions Community Activity;Cleaning;Meal Prep;Interpersonal Relationship    Stability/Clinical Decision Making Stable/Uncomplicated    Rehab Potential Good    PT Frequency 2x / week    PT Duration 6 weeks    PT Treatment/Interventions ADLs/Self Care Home Management;Electrical Stimulation;Iontophoresis 77m/ml Dexamethasone;Moist Heat;Neuromuscular re-education;Therapeutic exercise;Therapeutic activities;Patient/family education;Manual techniques;Passive range of motion    PT Next Visit Plan cervical flexibility, manual to UT, cervical AROM, strengthening           Patient will benefit from skilled therapeutic intervention in order to improve the following deficits and impairments:  Decreased range of motion,Increased muscle spasms,Impaired UE functional use,Pain,Impaired flexibility,Postural dysfunction  Visit Diagnosis: Cramp and spasm  Neck pain  Muscle weakness (generalized)     Problem List Patient Active Problem List   Diagnosis Date Noted  . PCP NOTES >>>>>>>>>>>>>>>>>>> 09/16/2015  . Multiple thyroid nodules 03/30/2015  . Annual physical exam 08/07/2014  . Bloody discharge from nipple - s/p surgery, resolved  12/29/2013  . Breast mass, right 12/11/2013  . At high risk for falls 12/11/2013  . Renal cysts, acquired, bilateral--per urology 05/04/2013  . Elevated LFTs 04/01/2013  . Cervical spinal stenosis 11/21/2012  . Nondiabetic gastroparesis 08/20/2012  . Hypothyroidism 08/08/2012  . Gastroparesis 05/06/2012  . Muscle cramp 01/19/2012  . Fatigue 01/14/2012  . Neurogenic bladder, prolapse ,h/o urinary retention, self cath, sees urology 11/05/2011  . Fecal incontinence 10/03/2011  . Osteoporosis 07/24/2011  . Adenoma of cecum 07/17/2011  . Chronic  constipation 07/17/2011  . Skin lesion 07/08/2011  . Chronic neck pain 04/16/2011  . Rectocele 04/16/2011  . Hypersomnia-- on dextroamphetamine 04/16/2011  . Vitamin D deficiency 04/16/2011  . Family history of colon cancer 04/16/2011  . Abnormal blood chemistry 04/16/2011  . Anemia   . COPD (chronic obstructive pulmonary disease) gold stage B   . Depression   . GERD (gastroesophageal reflux disease)   . Hyperlipidemia   . Hypertension   . Allergic rhinitis   . Pulmonary embolism-- in the 853s  . Skin cancer   . Seizures ([place]   . CIDP (chronic inflammatory demyelinating polyneuropathy) , Ataxia, Imbalance -- f/u at DBeatrice Lecher PTA 07/06/2020, 12:25 PM  [place] [address]Phone: [phone]  Fax:  [phone] Name: BAlexandera KuntzmanMRN: [id number] of Birth: 412-27-1934

## 2020-07-08 ENCOUNTER — Other Ambulatory Visit: Payer: Self-pay

## 2020-07-08 ENCOUNTER — Ambulatory Visit: Payer: Medicare Other | Admitting: Physical Therapy

## 2020-07-08 ENCOUNTER — Encounter: Payer: Self-pay | Admitting: Physical Therapy

## 2020-07-08 DIAGNOSIS — M6281 Muscle weakness (generalized): Secondary | ICD-10-CM

## 2020-07-08 DIAGNOSIS — M542 Cervicalgia: Secondary | ICD-10-CM | POA: Diagnosis not present

## 2020-07-08 DIAGNOSIS — R252 Cramp and spasm: Secondary | ICD-10-CM

## 2020-07-08 NOTE — Therapy (Signed)
[place]. [place], [place], [address] Phone: [phone]   Fax:  [phone] Progress Note Reporting Period 05/25/20 to 07/09/19 for the first 10 visits  See note below for Objective Data and Assessment of Progress/Goals.      Physical Therapy Treatment  Patient Details  Name: [name] MRN: [id number] Date of Birth: 31-Aug-1932 Referring Provider (PT): Cirigliano   Encounter Date: 07/08/2020   PT End of Session - 07/08/20 1216    Visit Number 10    Date for PT Re-Evaluation 07/26/20    PT Start Time 6962    PT Stop Time 1229    PT Time Calculation (min) 44 min    Activity Tolerance Patient tolerated treatment well    Behavior During Therapy [facility] for tasks assessed/performed           Past Medical History:  Diagnosis Date  . Allergic rhinitis   . Anemia   . Arthritis   . Asthma -COPD   . Blood transfusion 1957  . Breast mass    right breast in milk duct, bx neg  . Chronic fatigue syndrome   . Chronic inflammatory demyelinating polyneuropathy ([place]) 03/2011  . chronic sinusitis 08/08/2012  . Colon polyp   . Constipation   . COPD (chronic obstructive pulmonary disease) ([place])   . Cystocele 09/16/2012  . GERD (gastroesophageal reflux disease)   . Hyperlipidemia   . Hypertension   . Hypothyroid   . Neurogenic bladder 2013   husband caths pt, manages with timed void  . Osteoporosis   . Pulmonary embolism ([place])    1980s  . Raynaud phenomenon   . Renal cyst    Non-complex, contrast MRI 2013 with L>R non-complex cysts, dominant 3.7 left lower pole  . Seizures ([place])    1990 last seizures on meds Phenobarb  . Shingles late 59's  . Skin cancer    squamous cell  . Thyroid nodule    Right thyroid lobe, only seen on Sagittal imaging measures 2.3 cm in craniocaudal dimension and appears stable  . Tubular adenoma   . Vitamin D deficiency     Past Surgical History:  Procedure Laterality  Date  . ABDOMINAL HYSTERECTOMY    . ANTERIOR FUSION CLIVUS-C2 EXTRAORAL W/ ODONTOID EXCISION  8/14  . APPENDECTOMY    . BASAL CELL CARCINOMA EXCISION     face  . BLADDER SUSPENSION    . BREAST DUCTAL SYSTEM EXCISION Right 01/15/2014   Procedure: EXCISION DUCTAL SYSTEM RIGHT BREAST;  Surgeon: Stark Klein, MD;  Location: [place];  Service: General;  Laterality: Right;  . BREAST EXCISIONAL BIOPSY Right   . CARPAL TUNNEL RELEASE     right  . CATARACT EXTRACTION     bilateral  . cervical neck ablation     x 7, C3-C6/3 screws and plate  . CESAREAN SECTION    . CYSTOCELE REPAIR    . DILATION AND CURETTAGE OF UTERUS    . duptyren's contracture right hand    . FINGER ARTHRODESIS Right 05/06/2015   Procedure: RIGHT RING PROXIMAL INTERPHALANGEAL FUSION (PIP);  Surgeon: Leanora Cover, MD;  Location: [place];  Service: Orthopedics;  Laterality: Right;  . FINGER GANGLION CYST EXCISION     right  . HEMICOLECTOMY Right   . JOINT REPLACEMENT     right and left basal joints of thumbs  . left finger fusion     3 fingers on left hand/one finger right  .  left ovary and tube removed    . NASAL POLYP SURGERY     4 sinus surgeries  . NASAL SEPTUM SURGERY    . PANNICULECTOMY    . PROXIMAL INTERPHALANGEAL FUSION (PIP) Left 09/09/2013   Procedure: FUSION LEFT INDEX PROXIMAL INTERPHALANGEAL JOINT (PIP);  Surgeon: Cammie Sickle., MD;  Location: [facility];  Service: Orthopedics;  Laterality: Left;  . right median nerve decompression    . SHOULDER ARTHROSCOPY     x2 left, 1 right  . sinus surgeries     x 4  . squamous lesions removed     neck and face  . STERIOD INJECTION Right 05/06/2015   Procedure: STEROID INJECTION;  Surgeon: Leanora Cover, MD;  Location: [place];  Service: Orthopedics;  Laterality: Right;  Right Index Finger Proximal InterPhalangeal Joint Injection  . TONSILLECTOMY AND ADENOIDECTOMY    . TUBAL LIGATION    .  vocal polyps removed      There were no vitals filed for this visit.   Subjective Assessment - 07/08/20 1147    Subjective "I am fine"    Currently in Pain? No/denies                             [facility] Adult PT Treatment/Exercise - 07/08/20 0001      Neck Exercises: Machines for Strengthening   Nustep L4 x 6 min    Cybex Row 15# 2 sets 15    Lat Pull 15lb 2 sets 15      Neck Exercises: Seated   Neck Retraction 20 reps;3 secs    Shoulder Flexion 20 reps    Shoulder Flexion Weights (lbs) 2    Shoulder ABduction Both;20 reps;Weights    Shoulder Abduction Weights (lbs) 2    Other Seated Exercise Rows & Ext red 2x10      Moist Heat Therapy   Number Minutes Moist Heat 11 Minutes    Moist Heat Location Cervical;Shoulder      Electrical Stimulation   Electrical Stimulation Location CT junc    Electrical Stimulation Action IFC    Electrical Stimulation Parameters sitting    Electrical Stimulation Goals Pain                    PT Short Term Goals - 06/08/20 1219      PT SHORT TERM GOAL #1   Title Pt will be I with initial HEP    Status Achieved             PT Long Term Goals - 07/08/20 1218      PT LONG TERM GOAL #1   Title Pt will demo cervical ROM WFL with </= 1/10 cervical pain    Status Partially Met      PT LONG TERM GOAL #2   Title Pt will report 50% reduction in cervical pain    Status Achieved                 Plan - 07/08/20 1218    Clinical Impression Statement Pt reports that she is continues to do well. She did have some neck pain a few days ago. She did well overall with the interventions. Cues needed to hole muscle contraction with cervical retractions. No report of increase pain. Some UE fatigue with abductions.    Personal Factors and Comorbidities Comorbidity 3+    Comorbidities asthma, COPD, HTN, polyneuropathy, epilepsy  Examination-Activity Limitations Reach Overhead;Lift    Examination-Participation  Restrictions Community Activity;Cleaning;Meal Prep;Interpersonal Relationship    Stability/Clinical Decision Making Stable/Uncomplicated    Rehab Potential Good    PT Frequency 2x / week    PT Duration 6 weeks    PT Treatment/Interventions ADLs/Self Care Home Management;Electrical Stimulation;Iontophoresis 36m/ml Dexamethasone;Moist Heat;Neuromuscular re-education;Therapeutic exercise;Therapeutic activities;Patient/family education;Manual techniques;Passive range of motion    PT Next Visit Plan cervical flexibility, manual to UT, cervical AROM, strengthening           Patient will benefit from skilled therapeutic intervention in order to improve the following deficits and impairments:  Decreased range of motion,Increased muscle spasms,Impaired UE functional use,Pain,Impaired flexibility,Postural dysfunction  Visit Diagnosis: Neck pain  Cramp and spasm  Muscle weakness (generalized)     Problem List Patient Active Problem List   Diagnosis Date Noted  . PCP NOTES >>>>>>>>>>>>>>>>>>> 09/16/2015  . Multiple thyroid nodules 03/30/2015  . Annual physical exam 08/07/2014  . Bloody discharge from nipple - s/p surgery, resolved  12/29/2013  . Breast mass, right 12/11/2013  . At high risk for falls 12/11/2013  . Renal cysts, acquired, bilateral--per urology 05/04/2013  . Elevated LFTs 04/01/2013  . Cervical spinal stenosis 11/21/2012  . Nondiabetic gastroparesis 08/20/2012  . Hypothyroidism 08/08/2012  . Gastroparesis 05/06/2012  . Muscle cramp 01/19/2012  . Fatigue 01/14/2012  . Neurogenic bladder, prolapse ,h/o urinary retention, self cath, sees urology 11/05/2011  . Fecal incontinence 10/03/2011  . Osteoporosis 07/24/2011  . Adenoma of cecum 07/17/2011  . Chronic constipation 07/17/2011  . Skin lesion 07/08/2011  . Chronic neck pain 04/16/2011  . Rectocele 04/16/2011  . Hypersomnia-- on dextroamphetamine 04/16/2011  . Vitamin D deficiency 04/16/2011  . Family history of  colon cancer 04/16/2011  . Abnormal blood chemistry 04/16/2011  . Anemia   . COPD (chronic obstructive pulmonary disease) gold stage B   . Depression   . GERD (gastroesophageal reflux disease)   . Hyperlipidemia   . Hypertension   . Allergic rhinitis   . Pulmonary embolism-- in the 875s  . Skin cancer   . Seizures ([place]   . CIDP (chronic inflammatory demyelinating polyneuropathy) , Ataxia, Imbalance -- f/u at [facility] P[facility] 07/08/2020, 12:20 PM  [place] [address]: [phone]  Fax:  [phone] Name: BImmaculate CrutcherMRN: [id number] of Birth: [birth year]

## 2020-07-13 ENCOUNTER — Other Ambulatory Visit: Payer: Self-pay

## 2020-07-13 ENCOUNTER — Ambulatory Visit: Payer: Medicare Other | Admitting: Physical Therapy

## 2020-07-13 ENCOUNTER — Encounter: Payer: Self-pay | Admitting: Physical Therapy

## 2020-07-13 DIAGNOSIS — M6281 Muscle weakness (generalized): Secondary | ICD-10-CM

## 2020-07-13 DIAGNOSIS — M542 Cervicalgia: Secondary | ICD-10-CM | POA: Diagnosis not present

## 2020-07-13 DIAGNOSIS — R252 Cramp and spasm: Secondary | ICD-10-CM

## 2020-07-13 NOTE — Therapy (Signed)
[place]. [place], [place], [address] Phone: [phone]   Fax:  [phone]  Physical Therapy Treatment  Patient Details  Name: [name] MRN: [id number] Date of Birth: [birth year] Referring Provider (PT): Cirigliano   Encounter Date: 07/13/2020   PT End of Session - 07/13/20 1143    Visit Number 11    Date for PT Re-Evaluation 07/26/20    PT Start Time 1058    PT Stop Time 1141    PT Time Calculation (min) 43 min    Activity Tolerance Patient tolerated treatment well    Behavior During Therapy [facility] for tasks assessed/performed           Past Medical History:  Diagnosis Date  . Allergic rhinitis   . Anemia   . Arthritis   . Asthma -COPD   . Blood transfusion 1957  . Breast mass    right breast in milk duct, bx neg  . Chronic fatigue syndrome   . Chronic inflammatory demyelinating polyneuropathy ([place]) 03/2011  . chronic sinusitis 08/08/2012  . Colon polyp   . Constipation   . COPD (chronic obstructive pulmonary disease) ([place])   . Cystocele 09/16/2012  . GERD (gastroesophageal reflux disease)   . Hyperlipidemia   . Hypertension   . Hypothyroid   . Neurogenic bladder 2013   husband caths pt, manages with timed void  . Osteoporosis   . Pulmonary embolism ([place])    1980s  . Raynaud phenomenon   . Renal cyst    Non-complex, contrast MRI 2013 with L>R non-complex cysts, dominant 3.7 left lower pole  . Seizures ([place])    1990 last seizures on meds Phenobarb  . Shingles late 17's  . Skin cancer    squamous cell  . Thyroid nodule    Right thyroid lobe, only seen on Sagittal imaging measures 2.3 cm in craniocaudal dimension and appears stable  . Tubular adenoma   . Vitamin D deficiency     Past Surgical History:  Procedure Laterality Date  . ABDOMINAL HYSTERECTOMY    . ANTERIOR FUSION CLIVUS-C2 EXTRAORAL W/ ODONTOID EXCISION  8/14  . APPENDECTOMY    . BASAL CELL CARCINOMA EXCISION      face  . BLADDER SUSPENSION    . BREAST DUCTAL SYSTEM EXCISION Right 01/15/2014   Procedure: EXCISION DUCTAL SYSTEM RIGHT BREAST;  Surgeon: Stark Klein, MD;  Location: [place];  Service: General;  Laterality: Right;  . BREAST EXCISIONAL BIOPSY Right   . CARPAL TUNNEL RELEASE     right  . CATARACT EXTRACTION     bilateral  . cervical neck ablation     x 7, C3-C6/3 screws and plate  . CESAREAN SECTION    . CYSTOCELE REPAIR    . DILATION AND CURETTAGE OF UTERUS    . duptyren's contracture right hand    . FINGER ARTHRODESIS Right 05/06/2015   Procedure: RIGHT RING PROXIMAL INTERPHALANGEAL FUSION (PIP);  Surgeon: Leanora Cover, MD;  Location: [place];  Service: Orthopedics;  Laterality: Right;  . FINGER GANGLION CYST EXCISION     right  . HEMICOLECTOMY Right   . JOINT REPLACEMENT     right and left basal joints of thumbs  . left finger fusion     3 fingers on left hand/one finger right  . left ovary and tube removed    . NASAL POLYP SURGERY     4 sinus surgeries  . NASAL SEPTUM SURGERY    .  PANNICULECTOMY    . PROXIMAL INTERPHALANGEAL FUSION (PIP) Left 09/09/2013   Procedure: FUSION LEFT INDEX PROXIMAL INTERPHALANGEAL JOINT (PIP);  Surgeon: Cammie Sickle., MD;  Location: [facility];  Service: Orthopedics;  Laterality: Left;  . right median nerve decompression    . SHOULDER ARTHROSCOPY     x2 left, 1 right  . sinus surgeries     x 4  . squamous lesions removed     neck and face  . STERIOD INJECTION Right 05/06/2015   Procedure: STEROID INJECTION;  Surgeon: Leanora Cover, MD;  Location: [place];  Service: Orthopedics;  Laterality: Right;  Right Index Finger Proximal InterPhalangeal Joint Injection  . TONSILLECTOMY AND ADENOIDECTOMY    . TUBAL LIGATION    . vocal polyps removed      There were no vitals filed for this visit.   Subjective Assessment - 07/13/20 1100    Subjective "I am fine"    Currently in Pain?  No/denies    Pain Score 0-No pain    Pain Location Neck                             [facility] Adult PT Treatment/Exercise - 07/13/20 0001      Neck Exercises: Machines for Strengthening   UBE (Upper Arm Bike) L1.5 3 min each    Nustep L5 x 6 min    Cybex Row 15# 3x10    Lat Pull 15# 3x10      Neck Exercises: Seated   Neck Retraction 20 reps;3 secs    Shoulder Shrugs 20 reps    Shoulder Shrugs Limitations 2# with UT stretch    Shoulder Flexion 20 reps    Shoulder Flexion Weights (lbs) 2    Shoulder ABduction Both;20 reps;Weights    Shoulder Abduction Weights (lbs) 2    Other Seated Exercise Rows & Ext red 2x10    Other Seated Exercise ER red TB 2x10; diagonals with red TB 2x5 B                    PT Short Term Goals - 06/08/20 1219      PT SHORT TERM GOAL #1   Title Pt will be I with initial HEP    Status Achieved             PT Long Term Goals - 07/08/20 1218      PT LONG TERM GOAL #1   Title Pt will demo cervical ROM WFL with </= 1/10 cervical pain    Status Partially Met      PT LONG TERM GOAL #2   Title Pt will report 50% reduction in cervical pain    Status Achieved                 Plan - 07/13/20 1143    Clinical Impression Statement Pt presents to clinic reporting no pain this rx. Feels that she is doing much better overall. Did well with interventions, no increase in neck pain with exercise. Does require cuing with rows/extensions for form. Still has difficulty isolating upper cervical movement for neck retractions. Discussed potential for d/c at beginning of next week if things continue to go well with pt VU and agreement.    PT Treatment/Interventions ADLs/Self Care Home Management;Electrical Stimulation;Iontophoresis 107m/ml Dexamethasone;Moist Heat;Neuromuscular re-education;Therapeutic exercise;Therapeutic activities;Patient/family education;Manual techniques;Passive range of motion    PT Next Visit Plan cervical  flexibility, manual to  UT, cervical AROM, strengthening    Consulted and Agree with Plan of Care Patient           Patient will benefit from skilled therapeutic intervention in order to improve the following deficits and impairments:  Decreased range of motion,Increased muscle spasms,Impaired UE functional use,Pain,Impaired flexibility,Postural dysfunction  Visit Diagnosis: Neck pain  Cramp and spasm  Muscle weakness (generalized)     Problem List Patient Active Problem List   Diagnosis Date Noted  . PCP NOTES >>>>>>>>>>>>>>>>>>> 09/16/2015  . Multiple thyroid nodules 03/30/2015  . Annual physical exam 08/07/2014  . Bloody discharge from nipple - s/p surgery, resolved  12/29/2013  . Breast mass, right 12/11/2013  . At high risk for falls 12/11/2013  . Renal cysts, acquired, bilateral--per urology 05/04/2013  . Elevated LFTs 04/01/2013  . Cervical spinal stenosis 11/21/2012  . Nondiabetic gastroparesis 08/20/2012  . Hypothyroidism 08/08/2012  . Gastroparesis 05/06/2012  . Muscle cramp 01/19/2012  . Fatigue 01/14/2012  . Neurogenic bladder, prolapse ,h/o urinary retention, self cath, sees urology 11/05/2011  . Fecal incontinence 10/03/2011  . Osteoporosis 07/24/2011  . Adenoma of cecum 07/17/2011  . Chronic constipation 07/17/2011  . Skin lesion 07/08/2011  . Chronic neck pain 04/16/2011  . Rectocele 04/16/2011  . Hypersomnia-- on dextroamphetamine 04/16/2011  . Vitamin D deficiency 04/16/2011  . Family history of colon cancer 04/16/2011  . Abnormal blood chemistry 04/16/2011  . Anemia   . COPD (chronic obstructive pulmonary disease) gold stage B   . Depression   . GERD (gastroesophageal reflux disease)   . Hyperlipidemia   . Hypertension   . Allergic rhinitis   . Pulmonary embolism-- in the 60s   . Skin cancer   . Seizures ([place])   . CIDP (chronic inflammatory demyelinating polyneuropathy) , Ataxia, Imbalance -- f/u at [facility], PT, DPT Donald Prose  Law Corsino 07/13/2020, 11:45 AM  [place]. [place], [place], [address] Phone: [phone]   Fax:  [phone]  Name: [name] MRN: [id number] Date of Birth: 08/07/1932

## 2020-07-15 ENCOUNTER — Ambulatory Visit: Payer: Medicare Other | Admitting: Physical Therapy

## 2020-07-15 ENCOUNTER — Other Ambulatory Visit: Payer: Self-pay

## 2020-07-15 ENCOUNTER — Encounter: Payer: Self-pay | Admitting: Physical Therapy

## 2020-07-15 DIAGNOSIS — R252 Cramp and spasm: Secondary | ICD-10-CM

## 2020-07-15 DIAGNOSIS — M6281 Muscle weakness (generalized): Secondary | ICD-10-CM

## 2020-07-15 DIAGNOSIS — M542 Cervicalgia: Secondary | ICD-10-CM

## 2020-07-15 NOTE — Therapy (Signed)
[place]. [place], [place], [address] Phone: [phone]   Fax:  [phone]  Physical Therapy Treatment  Patient Details  Name: Natayah Warmack MRN: [id number] Date of Birth: 07-21-1932 Referring Provider (PT): Cirigliano   Encounter Date: 07/15/2020   PT End of Session - 07/15/20 1224    Visit Number 12    Date for PT Re-Evaluation 07/26/20    PT Start Time 1141    PT Stop Time 1224    PT Time Calculation (min) 43 min    Activity Tolerance Patient tolerated treatment well    Behavior During Therapy [facility] for tasks assessed/performed           Past Medical History:  Diagnosis Date  . Allergic rhinitis   . Anemia   . Arthritis   . Asthma -COPD   . Blood transfusion 1957  . Breast mass    right breast in milk duct, bx neg  . Chronic fatigue syndrome   . Chronic inflammatory demyelinating polyneuropathy ([place]) 03/2011  . chronic sinusitis 08/08/2012  . Colon polyp   . Constipation   . COPD (chronic obstructive pulmonary disease) ([place])   . Cystocele 09/16/2012  . GERD (gastroesophageal reflux disease)   . Hyperlipidemia   . Hypertension   . Hypothyroid   . Neurogenic bladder 2013   husband caths pt, manages with timed void  . Osteoporosis   . Pulmonary embolism ([place])    1980s  . Raynaud phenomenon   . Renal cyst    Non-complex, contrast MRI 2013 with L>R non-complex cysts, dominant 3.7 left lower pole  . Seizures ([place])    1990 last seizures on meds Phenobarb  . Shingles late 56's  . Skin cancer    squamous cell  . Thyroid nodule    Right thyroid lobe, only seen on Sagittal imaging measures 2.3 cm in craniocaudal dimension and appears stable  . Tubular adenoma   . Vitamin D deficiency     Past Surgical History:  Procedure Laterality Date  . ABDOMINAL HYSTERECTOMY    . ANTERIOR FUSION CLIVUS-C2 EXTRAORAL W/ ODONTOID EXCISION  8/14  . APPENDECTOMY    . BASAL CELL CARCINOMA EXCISION      face  . BLADDER SUSPENSION    . BREAST DUCTAL SYSTEM EXCISION Right 01/15/2014   Procedure: EXCISION DUCTAL SYSTEM RIGHT BREAST;  Surgeon: Stark Klein, MD;  Location: [place] [facility];  Service: General;  Laterality: Right;  . BREAST EXCISIONAL BIOPSY Right   . CARPAL TUNNEL RELEASE     right  . CATARACT EXTRACTION     bilateral  . cervical neck ablation     x 7, C3-C6/3 screws and plate  . CESAREAN SECTION    . CYSTOCELE REPAIR    . DILATION AND CURETTAGE OF UTERUS    . duptyren's contracture right hand    . FINGER ARTHRODESIS Right 05/06/2015   Procedure: RIGHT RING PROXIMAL INTERPHALANGEAL FUSION (PIP);  Surgeon: Leanora Cover, MD;  Location: [facility];  Service: Orthopedics;  Laterality: Right;  . FINGER GANGLION CYST EXCISION     right  . HEMICOLECTOMY Right   . JOINT REPLACEMENT     right and left basal joints of thumbs  . left finger fusion     3 fingers on left hand/one finger right  . left ovary and tube removed    . NASAL POLYP SURGERY     4 sinus surgeries  . NASAL SEPTUM SURGERY    .  PANNICULECTOMY    . PROXIMAL INTERPHALANGEAL FUSION (PIP) Left 09/09/2013   Procedure: FUSION LEFT INDEX PROXIMAL INTERPHALANGEAL JOINT (PIP);  Surgeon: Cammie Sickle., MD;  Location: [facility];  Service: Orthopedics;  Laterality: Left;  . right median nerve decompression    . SHOULDER ARTHROSCOPY     x2 left, 1 right  . sinus surgeries     x 4  . squamous lesions removed     neck and face  . STERIOD INJECTION Right 05/06/2015   Procedure: STEROID INJECTION;  Surgeon: Leanora Cover, MD;  Location: [place] [place];  Service: Orthopedics;  Laterality: Right;  Right Index Finger Proximal InterPhalangeal Joint Injection  . TONSILLECTOMY AND ADENOIDECTOMY    . TUBAL LIGATION    . vocal polyps removed      There were no vitals filed for this visit.   Subjective Assessment - 07/15/20 1144    Subjective "I feel great"    Currently in  Pain? No/denies                             [facility] Adult PT Treatment/Exercise - 07/15/20 0001      Neck Exercises: Machines for Strengthening   UBE (Upper Arm Bike) L1.5 2 min each    Nustep L5 x 6 min    Cybex Row 15# 2 sets 15    Lat Pull 15lb 2 sets 15      Neck Exercises: Seated   Neck Retraction 20 reps;3 secs    Shoulder Shrugs 20 reps    Shoulder Shrugs Limitations 3# with UT stretch    Shoulder Flexion 20 reps    Shoulder Flexion Weights (lbs) 2    Shoulder ABduction Both;20 reps;Weights    Shoulder Abduction Weights (lbs) 2    Other Seated Exercise Rows & Ext red 2x15                    PT Short Term Goals - 06/08/20 1219      PT SHORT TERM GOAL #1   Title Pt will be I with initial HEP    Status Achieved             PT Long Term Goals - 07/08/20 1218      PT LONG TERM GOAL #1   Title Pt will demo cervical ROM WFL with </= 1/10 cervical pain    Status Partially Met      PT LONG TERM GOAL #2   Title Pt will report 50% reduction in cervical pain    Status Achieved                 Plan - 07/15/20 1226    Clinical Impression Statement Pt continues to do well overall. No issues completing today interventions. Postural cues given at times with seated rows and extensions. Tactile cues given to T[place] with cervical retractions and with seated rows to prevent posterior leaning.    Personal Factors and Comorbidities Comorbidity 3+    Comorbidities asthma, COPD, HTN, polyneuropathy, epilepsy    Examination-Activity Limitations Reach Overhead;Lift    Examination-Participation Restrictions Community Activity;Cleaning;Meal Prep;Interpersonal Relationship    Stability/Clinical Decision Making Stable/Uncomplicated    Rehab Potential Good    PT Frequency 2x / week    PT Duration 6 weeks    PT Treatment/Interventions ADLs/Self Care Home Management;Electrical Stimulation;Iontophoresis 77m/ml Dexamethasone;Moist Heat;Neuromuscular  re-education;Therapeutic exercise;Therapeutic activities;Patient/family education;Manual techniques;Passive range of motion  PT Next Visit Plan Possible D/C           Patient will benefit from skilled therapeutic intervention in order to improve the following deficits and impairments:  Decreased range of motion,Increased muscle spasms,Impaired UE functional use,Pain,Impaired flexibility,Postural dysfunction  Visit Diagnosis: Cramp and spasm  Muscle weakness (generalized)  Neck pain     Problem List Patient Active Problem List   Diagnosis Date Noted  . PCP NOTES >>>>>>>>>>>>>>>>>>> 09/16/2015  . Multiple thyroid nodules 03/30/2015  . Annual physical exam 08/07/2014  . Bloody discharge from nipple - s/p surgery, resolved  12/29/2013  . Breast mass, right 12/11/2013  . At high risk for falls 12/11/2013  . Renal cysts, acquired, bilateral--per urology 05/04/2013  . Elevated LFTs 04/01/2013  . Cervical spinal stenosis 11/21/2012  . Nondiabetic gastroparesis 08/20/2012  . Hypothyroidism 08/08/2012  . Gastroparesis 05/06/2012  . Muscle cramp 01/19/2012  . Fatigue 01/14/2012  . Neurogenic bladder, prolapse ,h/o urinary retention, self cath, sees urology 11/05/2011  . Fecal incontinence 10/03/2011  . Osteoporosis 07/24/2011  . Adenoma of cecum 07/17/2011  . Chronic constipation 07/17/2011  . Skin lesion 07/08/2011  . Chronic neck pain 04/16/2011  . Rectocele 04/16/2011  . Hypersomnia-- on dextroamphetamine 04/16/2011  . Vitamin D deficiency 04/16/2011  . Family history of colon cancer 04/16/2011  . Abnormal blood chemistry 04/16/2011  . Anemia   . COPD (chronic obstructive pulmonary disease) gold stage B   . Depression   . GERD (gastroesophageal reflux disease)   . Hyperlipidemia   . Hypertension   . Allergic rhinitis   . Pulmonary embolism-- in the 22s   . Skin cancer   . Seizures ([place])   . CIDP (chronic inflammatory demyelinating polyneuropathy) , Ataxia,  Imbalance -- f/u at [facility], PTA 07/15/2020, 12:29 PM  [place]. [place], [place], [address] Phone: [phone]   Fax:  [phone]  Name: Synthia Fairbank MRN: [id number] Date of Birth: Jan 08, 1933

## 2020-07-19 ENCOUNTER — Other Ambulatory Visit: Payer: Self-pay

## 2020-07-19 ENCOUNTER — Ambulatory Visit: Payer: Medicare Other | Admitting: Physical Therapy

## 2020-07-19 ENCOUNTER — Encounter: Payer: Self-pay | Admitting: Physical Therapy

## 2020-07-19 DIAGNOSIS — M542 Cervicalgia: Secondary | ICD-10-CM | POA: Diagnosis not present

## 2020-07-19 DIAGNOSIS — M6281 Muscle weakness (generalized): Secondary | ICD-10-CM

## 2020-07-19 DIAGNOSIS — R252 Cramp and spasm: Secondary | ICD-10-CM

## 2020-07-19 NOTE — Therapy (Signed)
[place] [place], [place], [address] Phone: [phone]   Fax:  [phone]  Physical Therapy Treatment  Patient Details  Name: [name] MRN: [id number] Date of Birth: 06-29-1932 Referring Provider (PT): Cirigliano   Encounter Date: 07/19/2020   PT End of Session - 07/19/20 1133    Visit Number 13    Date for PT Re-Evaluation 07/26/20    PT Start Time 1100    PT Stop Time 1133    PT Time Calculation (min) 33 min    Activity Tolerance Patient tolerated treatment well    Behavior During Therapy [facility] for tasks assessed/performed           Past Medical History:  Diagnosis Date  . Allergic rhinitis   . Anemia   . Arthritis   . Asthma -COPD   . Blood transfusion 1957  . Breast mass    right breast in milk duct, bx neg  . Chronic fatigue syndrome   . Chronic inflammatory demyelinating polyneuropathy ([place]) 03/2011  . chronic sinusitis 08/08/2012  . Colon polyp   . Constipation   . COPD (chronic obstructive pulmonary disease) ([place])   . Cystocele 09/16/2012  . GERD (gastroesophageal reflux disease)   . Hyperlipidemia   . Hypertension   . Hypothyroid   . Neurogenic bladder 2013   husband caths pt, manages with timed void  . Osteoporosis   . Pulmonary embolism ([place])    1980s  . Raynaud phenomenon   . Renal cyst    Non-complex, contrast MRI 2013 with L>R non-complex cysts, dominant 3.7 left lower pole  . Seizures ([place])    1990 last seizures on meds Phenobarb  . Shingles late 93's  . Skin cancer    squamous cell  . Thyroid nodule    Right thyroid lobe, only seen on Sagittal imaging measures 2.3 cm in craniocaudal dimension and appears stable  . Tubular adenoma   . Vitamin D deficiency     Past Surgical History:  Procedure Laterality Date  . ABDOMINAL HYSTERECTOMY    . ANTERIOR FUSION CLIVUS-C2 EXTRAORAL W/ ODONTOID EXCISION  8/14  . APPENDECTOMY    . BASAL CELL CARCINOMA EXCISION      face  . BLADDER SUSPENSION    . BREAST DUCTAL SYSTEM EXCISION Right 01/15/2014   Procedure: EXCISION DUCTAL SYSTEM RIGHT BREAST;  Surgeon: Stark Klein, MD;  Location: [facility];  Service: General;  Laterality: Right;  . BREAST EXCISIONAL BIOPSY Right   . CARPAL TUNNEL RELEASE     right  . CATARACT EXTRACTION     bilateral  . cervical neck ablation     x 7, C3-C6/3 screws and plate  . CESAREAN SECTION    . CYSTOCELE REPAIR    . DILATION AND CURETTAGE OF UTERUS    . duptyren's contracture right hand    . FINGER ARTHRODESIS Right 05/06/2015   Procedure: RIGHT RING PROXIMAL INTERPHALANGEAL FUSION (PIP);  Surgeon: Leanora Cover, MD;  Location: [place];  Service: Orthopedics;  Laterality: Right;  . FINGER GANGLION CYST EXCISION     right  . HEMICOLECTOMY Right   . JOINT REPLACEMENT     right and left basal joints of thumbs  . left finger fusion     3 fingers on left hand/one finger right  . left ovary and tube removed    . NASAL POLYP SURGERY     4 sinus surgeries  . NASAL SEPTUM SURGERY    .  PANNICULECTOMY    . PROXIMAL INTERPHALANGEAL FUSION (PIP) Left 09/09/2013   Procedure: FUSION LEFT INDEX PROXIMAL INTERPHALANGEAL JOINT (PIP);  Surgeon: Cammie Sickle., MD;  Location[facility];  Service: Orthopedics;  Laterality: Left;  . right median nerve decompression    . SHOULDER ARTHROSCOPY     x2 left, 1 right  . sinus surgeries     x 4  . squamous lesions removed     neck and face  . STERIOD INJECTION Right 05/06/2015   Procedure: STEROID INJECTION;  Surgeon: Leanora Cover, MD;  Location: [facility];  Service: Orthopedics;  Laterality: Right;  Right Index Finger Proximal InterPhalangeal Joint Injection  . TONSILLECTOMY AND ADENOIDECTOMY    . TUBAL LIGATION    . vocal polyps removed      There were no vitals filed for this visit.   Subjective Assessment - 07/19/20 1104    Subjective "Fine"    Currently in Pain?  No/denies                             [facility] Adult PT Treatment/Exercise - 07/19/20 0001      Neck Exercises: Machines for Strengthening   UBE (Upper Arm Bike) L1.5 2 min each    Nustep L5 x 4 min    Cybex Row 15# 2 sets 15    Lat Pull 15lb 2 sets 15      Neck Exercises: Seated   Shoulder Shrugs 20 reps    Shoulder Shrugs Limitations 3# with UT stretch    Shoulder Flexion 20 reps    Shoulder Flexion Weights (lbs) 2    Shoulder ABduction Both;20 reps;Weights    Shoulder Abduction Weights (lbs) 2    Other Seated Exercise Rows & Ext red 2x15    Other Seated Exercise Biceps curls 3lb 2x12                    PT Short Term Goals - 06/08/20 1219      PT SHORT TERM GOAL #1   Title Pt will be I with initial HEP    Status Achieved             PT Long Term Goals - 07/19/20 1133      PT LONG TERM GOAL #1   Title Pt will demo cervical ROM WFL with </= 1/10 cervical pain    Status Achieved      PT LONG TERM GOAL #2   Title Pt will report 50% reduction in cervical pain    Status Achieved      PT LONG TERM GOAL #3   Title Pt will be I with advanced HEP    Status Achieved                 Plan - 07/19/20 1134    Clinical Impression Statement Pt has progressed meeting goals. She reports no pain and is pleased with her current functional stated.    Personal Factors and Comorbidities Comorbidity 3+    Comorbidities asthma, COPD, HTN, polyneuropathy, epilepsy    Examination-Activity Limitations Reach Overhead;Lift    Examination-Participation Restrictions Community Activity;Cleaning;Meal Prep;Interpersonal Relationship    Stability/Clinical Decision Making Stable/Uncomplicated    Rehab Potential Good    PT Duration 6 weeks    PT Treatment/Interventions ADLs/Self Care Home Management;Electrical Stimulation;Iontophoresis 73m/ml Dexamethasone;Moist Heat;Neuromuscular re-education;Therapeutic exercise;Therapeutic activities;Patient/family  education;Manual techniques;Passive range of motion    PT Next Visit  Plan D/C PT           Patient will benefit from skilled therapeutic intervention in order to improve the following deficits and impairments:  Decreased range of motion,Increased muscle spasms,Impaired UE functional use,Pain,Impaired flexibility,Postural dysfunction  Visit Diagnosis: Neck pain  Muscle weakness (generalized)  Cramp and spasm     Problem List Patient Active Problem List   Diagnosis Date Noted  . PCP NOTES >>>>>>>>>>>>>>>>>>> 09/16/2015  . Multiple thyroid nodules 03/30/2015  . Annual physical exam 08/07/2014  . Bloody discharge from nipple - s/p surgery, resolved  12/29/2013  . Breast mass, right 12/11/2013  . At high risk for falls 12/11/2013  . Renal cysts, acquired, bilateral--per urology 05/04/2013  . Elevated LFTs 04/01/2013  . Cervical spinal stenosis 11/21/2012  . Nondiabetic gastroparesis 08/20/2012  . Hypothyroidism 08/08/2012  . Gastroparesis 05/06/2012  . Muscle cramp 01/19/2012  . Fatigue 01/14/2012  . Neurogenic bladder, prolapse ,h/o urinary retention, self cath, sees urology 11/05/2011  . Fecal incontinence 10/03/2011  . Osteoporosis 07/24/2011  . Adenoma of cecum 07/17/2011  . Chronic constipation 07/17/2011  . Skin lesion 07/08/2011  . Chronic neck pain 04/16/2011  . Rectocele 04/16/2011  . Hypersomnia-- on dextroamphetamine 04/16/2011  . Vitamin D deficiency 04/16/2011  . Family history of colon cancer 04/16/2011  . Abnormal blood chemistry 04/16/2011  . Anemia   . COPD (chronic obstructive pulmonary disease) gold stage B   . Depression   . GERD (gastroesophageal reflux disease)   . Hyperlipidemia   . Hypertension   . Allergic rhinitis   . Pulmonary embolism-- in the 62s   . Skin cancer   . Seizures ([place])   . CIDP (chronic inflammatory demyelinating polyneuropathy) , Ataxia, Imbalance -- f/u at [place]  Visits from Start  of Care: 13  Plan: Patient agrees to discharge.  Patient goals were met. Patient is being discharged due to meeting the stated rehab goals.  ?????      Scot Jun, PTA 07/19/2020, 11:35 AM  [address]. [place], [place], [address] Phone: [phone]   Fax:  [phone]  Name: [name] MRN: [id number] Date of Birth: 01-18-1933

## 2020-07-21 ENCOUNTER — Encounter: Payer: Medicare Other | Admitting: Physical Therapy

## 2020-07-26 ENCOUNTER — Telehealth: Payer: Self-pay | Admitting: Family Medicine

## 2020-07-26 ENCOUNTER — Encounter: Payer: Medicare Other | Admitting: Physical Therapy

## 2020-07-26 DIAGNOSIS — E039 Hypothyroidism, unspecified: Secondary | ICD-10-CM

## 2020-07-26 NOTE — Telephone Encounter (Signed)
Last OV 06/23/20 Last fill 07/30/19  #90/3

## 2020-07-29 NOTE — Telephone Encounter (Signed)
Levothyroxine was sent 07/26/20

## 2020-07-29 NOTE — Telephone Encounter (Signed)
Pt called to follow up on this and make sure there wasn't any issues, please advise

## 2020-07-29 NOTE — Telephone Encounter (Signed)
I called pharmacy and they did confirm they have the order. Maybe there was a miscommunication between pharmacy and patient, Tried to call pt was unable to get in touch.

## 2020-08-10 ENCOUNTER — Other Ambulatory Visit: Payer: Self-pay

## 2020-08-10 ENCOUNTER — Other Ambulatory Visit: Payer: Self-pay | Admitting: Family Medicine

## 2020-08-10 DIAGNOSIS — G4710 Hypersomnia, unspecified: Secondary | ICD-10-CM

## 2020-08-10 DIAGNOSIS — I10 Essential (primary) hypertension: Secondary | ICD-10-CM

## 2020-08-24 DIAGNOSIS — R0602 Shortness of breath: Secondary | ICD-10-CM | POA: Diagnosis not present

## 2020-08-24 DIAGNOSIS — J449 Chronic obstructive pulmonary disease, unspecified: Secondary | ICD-10-CM | POA: Diagnosis not present

## 2020-09-07 DIAGNOSIS — Z23 Encounter for immunization: Secondary | ICD-10-CM | POA: Diagnosis not present

## 2020-09-08 ENCOUNTER — Other Ambulatory Visit: Payer: Self-pay | Admitting: Family Medicine

## 2020-09-15 ENCOUNTER — Other Ambulatory Visit: Payer: Self-pay | Admitting: Family Medicine

## 2020-09-15 DIAGNOSIS — G4710 Hypersomnia, unspecified: Secondary | ICD-10-CM

## 2020-09-27 DIAGNOSIS — J0121 Acute recurrent ethmoidal sinusitis: Secondary | ICD-10-CM | POA: Diagnosis not present

## 2020-10-01 DIAGNOSIS — R0981 Nasal congestion: Secondary | ICD-10-CM | POA: Diagnosis not present

## 2020-10-01 DIAGNOSIS — R059 Cough, unspecified: Secondary | ICD-10-CM | POA: Diagnosis not present

## 2020-10-01 DIAGNOSIS — R509 Fever, unspecified: Secondary | ICD-10-CM | POA: Diagnosis not present

## 2020-10-12 DIAGNOSIS — I7300 Raynaud's syndrome without gangrene: Secondary | ICD-10-CM | POA: Diagnosis not present

## 2020-10-12 DIAGNOSIS — R269 Unspecified abnormalities of gait and mobility: Secondary | ICD-10-CM | POA: Diagnosis not present

## 2020-10-12 DIAGNOSIS — L943 Sclerodactyly: Secondary | ICD-10-CM | POA: Diagnosis not present

## 2020-10-13 ENCOUNTER — Telehealth: Payer: Self-pay

## 2020-10-13 NOTE — Telephone Encounter (Signed)
Pt see's Dr Manuella Ghazi, a rheumatologist at [facility]. He is requesting during pts June visit an EKG be done and results faxed to him at [phone]  I advised patient I would put back this request in a telephone message which will be in her chart. I also let her know I couldn't say for sure that Dr C will honor that request.  Levada Dy

## 2020-10-14 NOTE — Telephone Encounter (Signed)
Ok to send

## 2020-10-14 NOTE — Telephone Encounter (Signed)
Yes we can do an EKG at her next OV in 10/2020 and fax to Dr. Manuella Ghazi

## 2020-10-22 ENCOUNTER — Other Ambulatory Visit: Payer: Self-pay | Admitting: Family Medicine

## 2020-10-22 DIAGNOSIS — R569 Unspecified convulsions: Secondary | ICD-10-CM

## 2020-11-03 ENCOUNTER — Other Ambulatory Visit: Payer: Self-pay

## 2020-11-03 ENCOUNTER — Ambulatory Visit (INDEPENDENT_AMBULATORY_CARE_PROVIDER_SITE_OTHER): Payer: Medicare Other | Admitting: Family Medicine

## 2020-11-03 ENCOUNTER — Encounter: Payer: Self-pay | Admitting: Family Medicine

## 2020-11-03 VITALS — BP 118/62 | HR 96 | Temp 97.7°F | Ht 62.0 in | Wt 123.8 lb

## 2020-11-03 DIAGNOSIS — E039 Hypothyroidism, unspecified: Secondary | ICD-10-CM | POA: Diagnosis not present

## 2020-11-03 DIAGNOSIS — R011 Cardiac murmur, unspecified: Secondary | ICD-10-CM | POA: Diagnosis not present

## 2020-11-03 DIAGNOSIS — E559 Vitamin D deficiency, unspecified: Secondary | ICD-10-CM | POA: Diagnosis not present

## 2020-11-03 DIAGNOSIS — E785 Hyperlipidemia, unspecified: Secondary | ICD-10-CM | POA: Diagnosis not present

## 2020-11-03 DIAGNOSIS — I10 Essential (primary) hypertension: Secondary | ICD-10-CM

## 2020-11-03 DIAGNOSIS — D692 Other nonthrombocytopenic purpura: Secondary | ICD-10-CM | POA: Diagnosis not present

## 2020-11-03 DIAGNOSIS — G4710 Hypersomnia, unspecified: Secondary | ICD-10-CM

## 2020-11-03 LAB — COMPREHENSIVE METABOLIC PANEL
ALT: 19 U/L (ref 0–35)
AST: 22 U/L (ref 0–37)
Albumin: 4.2 g/dL (ref 3.5–5.2)
Alkaline Phosphatase: 76 U/L (ref 39–117)
BUN: 32 mg/dL — ABNORMAL HIGH (ref 6–23)
CO2: 26 mEq/L (ref 19–32)
Calcium: 9.2 mg/dL (ref 8.4–10.5)
Chloride: 108 mEq/L (ref 96–112)
Creatinine, Ser: 0.9 mg/dL (ref 0.40–1.20)
GFR: 58.02 mL/min — ABNORMAL LOW (ref >60.00)
Glucose, Bld: 98 mg/dL (ref 70–99)
Potassium: 4.2 mEq/L (ref 3.5–5.1)
Sodium: 142 mEq/L (ref 135–145)
Total Bilirubin: 0.4 mg/dL (ref 0.2–1.2)
Total Protein: 6.8 g/dL (ref 6.0–8.3)

## 2020-11-03 LAB — LIPID PANEL
Cholesterol: 236 mg/dL — ABNORMAL HIGH (ref 0–200)
HDL: 90.7 mg/dL (ref >39.00)
LDL Cholesterol: 124 mg/dL — ABNORMAL HIGH (ref 0–99)
NonHDL: 145.03
Total CHOL/HDL Ratio: 3
Triglycerides: 106 mg/dL (ref 0.0–149.0)
VLDL: 21.2 mg/dL (ref 0.0–40.0)

## 2020-11-03 LAB — CBC
HCT: 42.5 % (ref 36.0–46.0)
Hemoglobin: 14.1 g/dL (ref 12.0–15.0)
MCHC: 33.2 g/dL (ref 30.0–36.0)
MCV: 92.7 fl (ref 78.0–100.0)
Platelets: 254 10*3/uL (ref 150.0–400.0)
RBC: 4.58 Mil/uL (ref 3.87–5.11)
RDW: 13.6 % (ref 11.5–15.5)
WBC: 5 10*3/uL (ref 4.0–10.5)

## 2020-11-03 LAB — VITAMIN D 25 HYDROXY (VIT D DEFICIENCY, FRACTURES): VITD: 30.22 ng/mL (ref 30.00–100.00)

## 2020-11-03 LAB — T4, FREE: Free T4: 0.62 ng/dL (ref 0.60–1.60)

## 2020-11-03 LAB — TSH: TSH: 1.21 u[IU]/mL (ref 0.35–4.50)

## 2020-11-03 NOTE — Addendum Note (Signed)
Addended by: Ronnald Nian on: 11/03/2020 11:40 AM   Modules accepted: Orders

## 2020-11-03 NOTE — Progress Notes (Signed)
[name] is a 87 y.o. female  Chief Complaint  Patient presents with  . Follow-up    HPI: [name] is a 87 y.o. female patient seen today for routine f/u on chronic medical issues including HTN, HLD, hypothyroidism, Vit D deficiency, hypersomnia. Pt follows with neuro at [facility] for CIDP and well ar rheum at [facility] Dr. Manuella Ghazi. She follows with [facility] Dr. Raquel James.  For HTN pt is taking amlodipine 10 mg daily. For HLD pt is taking lipitor 40mg  daily. For hypothyroidism pt is taking levothyroxine 38mcg daily. For Vit D deficiency pt is taking 50,000IU qweek. For her hypersomnia pt is on dextrostat 10mg  QID.  She had all labs done in 04/2020.   Past Medical History:  Diagnosis Date  . Allergic rhinitis   . Anemia   . Arthritis   . Asthma -COPD   . Blood transfusion 1957  . Breast mass    right breast in milk duct, bx neg  . Chronic fatigue syndrome   . Chronic inflammatory demyelinating polyneuropathy ([place]) 03/2011  . chronic sinusitis 08/08/2012  . Colon polyp   . Constipation   . COPD (chronic obstructive pulmonary disease) ([place])   . Cystocele 09/16/2012  . GERD (gastroesophageal reflux disease)   . Hyperlipidemia   . Hypertension   . Hypothyroid   . Neurogenic bladder 2013   husband caths pt, manages with timed void  . Osteoporosis   . Pulmonary embolism ([place])    1980s  . Raynaud phenomenon   . Renal cyst    Non-complex, contrast MRI 2013 with L>R non-complex cysts, dominant 3.7 left lower pole  . Seizures ([place])    1990 last seizures on meds Phenobarb  . Shingles late 76's  . Skin cancer    squamous cell  . Thyroid nodule    Right thyroid lobe, only seen on Sagittal imaging measures 2.3 cm in craniocaudal dimension and appears stable  . Tubular adenoma   . Vitamin D deficiency     Past Surgical History:  Procedure Laterality Date  . ABDOMINAL HYSTERECTOMY    . ANTERIOR FUSION CLIVUS-C2 EXTRAORAL W/ ODONTOID EXCISION  8/14  . APPENDECTOMY     . BASAL CELL CARCINOMA EXCISION     face  . BLADDER SUSPENSION    . BREAST DUCTAL SYSTEM EXCISION Right 01/15/2014   Procedure: EXCISION DUCTAL SYSTEM RIGHT BREAST;  Surgeon: Stark Klein, MD;  Location: [facility];  Service: General;  Laterality: Right;  . BREAST EXCISIONAL BIOPSY Right   . CARPAL TUNNEL RELEASE     right  . CATARACT EXTRACTION     bilateral  . cervical neck ablation     x 7, C3-C6/3 screws and plate  . CESAREAN SECTION    . CYSTOCELE REPAIR    . DILATION AND CURETTAGE OF UTERUS    . duptyren's contracture right hand    . FINGER ARTHRODESIS Right 05/06/2015   Procedure: RIGHT RING PROXIMAL INTERPHALANGEAL FUSION (PIP);  Surgeon: Leanora Cover, MD;  Location: [facility];  Service: Orthopedics;  Laterality: Right;  . FINGER GANGLION CYST EXCISION     right  . HEMICOLECTOMY Right   . JOINT REPLACEMENT     right and left basal joints of thumbs  . left finger fusion     3 fingers on left hand/one finger right  . left ovary and tube removed    . NASAL POLYP SURGERY     4 sinus surgeries  . NASAL SEPTUM SURGERY    .  PANNICULECTOMY    . PROXIMAL INTERPHALANGEAL FUSION (PIP) Left 09/09/2013   Procedure: FUSION LEFT INDEX PROXIMAL INTERPHALANGEAL JOINT (PIP);  Surgeon: Cammie Sickle., MD;  Location: [facility];  Service: Orthopedics;  Laterality: Left;  . right median nerve decompression    . SHOULDER ARTHROSCOPY     x2 left, 1 right  . sinus surgeries     x 4  . squamous lesions removed     neck and face  . STERIOD INJECTION Right 05/06/2015   Procedure: STEROID INJECTION;  Surgeon: Leanora Cover, MD;  Location: [place];  Service: Orthopedics;  Laterality: Right;  Right Index Finger Proximal InterPhalangeal Joint Injection  . TONSILLECTOMY AND ADENOIDECTOMY    . TUBAL LIGATION    . vocal polyps removed      Social History   Socioeconomic History  . Marital status: Married    Spouse name: Not on  file  . Number of children: 3  . Years of education: Not on file  . Highest education level: Not on file  Occupational History  . Occupation: retired, Licensed conveyancer    Employer: RETIRED  Tobacco Use  . Smoking status: Former Smoker    Packs/day: 0.50    Years: 3.00    Pack years: 1.50    Types: Cigarettes    Quit date: 05/29/1978    Years since quitting: 42.4  . Smokeless tobacco: Never Used  . Tobacco comment: quit smoking 35 years ago  Vaping Use  . Vaping Use: Never used  Substance and Sexual Activity  . Alcohol use: No  . Drug use: No  . Sexual activity: Yes  Other Topics Concern  . Not on file  Social History Narrative   Pt lives at home with her husband. She is originally from [place]. Moved to [place] in 2005.     3 grown children - Son is a Pharmacist, community in the area, son in [place], daughter in [place]   Pt is a retired Marine scientist   Social Determinants of Radio broadcast assistant Strain: [place]   . Difficulty of Paying Living Expenses: Not hard at all  Food Insecurity: No Food Insecurity  . Worried About Charity fundraiser in the Last Year: Never true  . Ran Out of Food in the Last Year: Never true  Transportation Needs: No Transportation Needs  . Lack of Transportation (Medical): No  . Lack of Transportation (Non-Medical): No  Physical Activity: Insufficiently Active  . Days of Exercise per Week: 2 days  . Minutes of Exercise per Session: 10 min  Stress: No Stress Concern Present  . Feeling of Stress : Not at all  Social Connections: Moderately Integrated  . Frequency of Communication with Friends and Family: More than three times a week  . Frequency of Social Gatherings with Friends and Family: Once a week  . Attends Religious Services: Never  . Active Member of Clubs or Organizations: Yes  . Attends Archivist Meetings: 1 to 4 times per year  . Marital Status: Married  Human resources officer Violence: Not At Risk  . Fear of Current or Ex-Partner: No  .  Emotionally Abused: No  . Physically Abused: No  . Sexually Abused: No    Family History  Problem Relation Age of Onset  . Heart disease Mother   . Hyperlipidemia Mother        Jerilynn Mages, aunt  . Ovarian cancer Sister   . Lung cancer Sister  lung - stage 1  . Thyroid cancer Sister   . Colon cancer Father        40  . Malignant hyperthermia Cousin   . Stomach cancer Other        first cousin  . Skin cancer Daughter   . Breast cancer Neg Hx   . Esophageal cancer Neg Hx   . Pancreatic cancer Neg Hx      Immunization History  Administered Date(s) Administered  . Fluad Quad(high Dose 65+) 03/13/2019  . Influenza Split 12/27/2012  . Influenza Whole 01/28/2011  . Influenza,inj,Quad PF,6+ Mos 03/16/2014, 02/17/2015  . Influenza-Unspecified 02/12/2012, 02/05/2015, 03/02/2020  . PFIZER(Purple Top)SARS-COV-2 Vaccination 06/17/2019, 07/08/2019, 03/02/2020  . Pneumococcal Conjugate-13 12/11/2013  . Pneumococcal Polysaccharide-23 05/29/2006  . Tdap 04/11/2011, 10/14/2018  . Zoster Recombinat (Shingrix) 05/01/2019, 07/30/2019  . Zoster, Live 04/14/2014    Outpatient Encounter Medications as of 11/03/2020  Medication Sig  . albuterol (PROVENTIL) (2.5 MG/3ML) 0.083% nebulizer solution USE 1 VIAL IN NEBULIZER 4 TIMES DAILY  . amLODipine (NORVASC) 10 MG tablet TAKE 1 TABLET BY MOUTH DAILY  . arformoterol (BROVANA) 15 MCG/2ML NEBU Take 2 mLs (15 mcg total) by nebulization 2 (two) times daily.  Marland Kitchen atorvastatin (LIPITOR) 40 MG tablet TAKE 1 TABLET BY MOUTH AT BEDTIME  . budesonide (PULMICORT) 0.25 MG/2ML nebulizer solution Take 2 mLs (0.25 mg total) by nebulization 2 (two) times daily.  . Calcium Carb-Cholecalciferol 500-125 MG-UNIT TABS Take 1,200 mg by mouth.  . Camphor-Menthol-Methyl Sal (SALONPAS) 3.06-03-08 % PTCH Apply topically.  . Cholecalciferol 25 MCG (1000 UT) capsule Take by mouth.  . [facility] BUDESONIDE 32 MCG/ACT nasal spray ADD 1ML OF MEDICATION ADD TO 250ML OF SALINE IN SALINE  IRRIGATION BOTTLE; IRRIGATE SINUSES WITH 120ML THROUGH EACH NOSTRIL ONCE DAILY  . dextroamphetamine (DEXTROSTAT) 10 MG tablet TAKE 1 TABLET BY MOUTH IN THE MORNING, AT NOON, IN THE EVENING, AND AT BEDTIME  . gabapentin (NEURONTIN) 300 MG capsule Take 300 mg by mouth at bedtime.  Marland Kitchen levothyroxine (SYNTHROID) 25 MCG tablet TAKE ONE (1) TABLET BY MOUTH EVERY DAY WITH BREAKFAST  . mometasone (NASONEX) 50 MCG/ACT nasal spray PLACE 2 SPRAYS INTO THE NOSE DAILY  . montelukast (SINGULAIR) 10 MG tablet Take 1 tablet (10 mg total) by mouth at bedtime.  . mupirocin ointment (BACTROBAN) 2 % ADD ONE INCH OF OINTMENT TO 8 OUNCES OF SINUS RINSE BOTTLE DAILY  . PHENobarbital (LUMINAL) 32.4 MG tablet TAKE ONE (1) TABLET BY MOUTH EVERY DAY AT 6AM AND TAKE TWO TABLETS DAILY AT 8PM  . polyethylene glycol powder (GLYCOLAX/MIRALAX) powder Take 3/4 capful dissolved in at least 8 ounces water/juice once daily. May titrate as needed  . sodium chloride (OCEAN) 0.65 % nasal spray Place into the nose.  . TEGRETOL-XR 100 MG 12 hr tablet Take 500 mg by mouth daily. Takes 2 tabs at night  . triamcinolone (KENALOG) 0.1 % Apply topically 2 (two) times daily.  . VENTOLIN HFA 108 (90 Base) MCG/ACT inhaler SMARTSIG:1 Puff(s) Via Inhaler Every 4 Hours PRN  . Vitamin D, Ergocalciferol, (DRISDOL) 1.25 MG (50000 UT) CAPS capsule Take 1 capsule (50,000 Units total) by mouth every 7 (seven) days.  . [DISCONTINUED] predniSONE (DELTASONE) 20 MG tablet Take 1 tablet (20 mg total) by mouth daily. (Patient taking differently: Take 15 mg by mouth daily.)  . NON FORMULARY Salon pas pain patches/ PRN (Patient not taking: Reported on 11/03/2020)  . [DISCONTINUED] famotidine (PEPCID) 20 MG tablet Take 20 mg by mouth 2 (two) times daily.  . [  DISCONTINUED] levofloxacin (LEVAQUIN) 500 MG tablet Take 500 mg by mouth daily.   No facility-administered encounter medications on file as of 11/03/2020.     ROS: Pertinent positives and negatives noted in HPI.  Remainder of ROS non-contributory    Allergies  Allergen Reactions  . Iodinated Diagnostic Agents Anaphylaxis    Cardiac arrest  . Iodine Other (See Comments)    Cardiac arrest As young adult, received iodinated contrast for IVP, went into cardiac arrest, was resuscitated and hospitalized. Has not received iodinated contrast since that episode. Reports no adverse reaction to betadine.   . Morphine Other (See Comments)    Cardiac arrest Patient has previously tolerated hydrocodone, hydromorphone and fentanyl   . Morphine And Related Other (See Comments)    Cardiac arrest  . Metrizamide Other (See Comments)    Cardiac arrest Cardiac arrest     BP 118/62   Pulse 96   Temp 97.7 F (36.5 C)   Ht 5\' 2"  (1.575 m)   Wt 123 lb 12.8 oz (56.2 kg)   SpO2 95%   BMI 22.64 kg/m   Wt Readings from Last 3 Encounters:  11/03/20 123 lb 12.8 oz (56.2 kg)  06/30/20 124 lb (56.2 kg)  06/23/20 122 lb 6.4 oz (55.5 kg)   Temp Readings from Last 3 Encounters:  11/03/20 97.7 F (36.5 C)  06/23/20 (!) 97.4 F (36.3 C) (Temporal)  06/15/20 (!) 96.9 F (36.1 C) (Temporal)   BP Readings from Last 3 Encounters:  11/03/20 118/62  06/30/20 126/82  06/23/20 120/76   Pulse Readings from Last 3 Encounters:  11/03/20 96  06/30/20 82  06/23/20 84      Physical Exam Constitutional:      General: She is not in acute distress. Cardiovascular:     Rate and Rhythm: Normal rate and regular rhythm.     Pulses: Normal pulses.  Pulmonary:     Effort: Pulmonary effort is normal.     Breath sounds: No wheezing or rhonchi.  Musculoskeletal:     Right lower leg: No edema.     Left lower leg: No edema.  Neurological:     Mental Status: She is alert and oriented to person, place, and time. Mental status is at baseline.  Psychiatric:        Mood and Affect: Mood normal.        Behavior: Behavior normal.      A/P:  1. Primary hypertension - controlled, at goal - cont amlodipine 10mg  daily -  CBC - Comprehensive metabolic panel  2. Hypothyroidism, unspecified type - on levothyroxine 7mcg daily - TSH - T4, free  3. Vitamin D deficiency - takes Vit D 1000IU daily - VITAMIN D 25 Hydroxy (Vit-D Deficiency, Fractures)  4. Hyperlipidemia, unspecified hyperlipidemia type - cont lipitor 40mg  daily - Lipid panel - Comprehensive metabolic panel  5. Hypersomnia-- on dextroamphetamine - on dextrostat 10mg  QID - database reviewed and appropriate   This visit occurred during the SARS-CoV-2 public health emergency.  Safety protocols were in place, including screening questions prior to the visit, additional usage of staff PPE, and extensive cleaning of exam room while observing appropriate contact time as indicated for disinfecting solutions.

## 2020-11-07 ENCOUNTER — Other Ambulatory Visit: Payer: Self-pay | Admitting: Family Medicine

## 2020-11-07 DIAGNOSIS — G4710 Hypersomnia, unspecified: Secondary | ICD-10-CM

## 2020-11-08 NOTE — Telephone Encounter (Signed)
Refill request for:   Dextroamphetamine 10 mg LR 09/15/20 # 120, 0 rf LOV 11/03/20 FOV  no ne scheduled   Please review and advise.  Thanks.  Dm/cma

## 2020-11-09 ENCOUNTER — Telehealth: Payer: Self-pay | Admitting: Family Medicine

## 2020-11-09 NOTE — Telephone Encounter (Signed)
Pt called and said they are having trouble with logging into mychart and asked if labs from 11/03/20 could be printed for them to pick up. Please call when ready for pick up at [phone]

## 2020-11-10 NOTE — Telephone Encounter (Signed)
Labs printed off and placed in basket.  Pt aware.

## 2020-11-15 ENCOUNTER — Ambulatory Visit (HOSPITAL_COMMUNITY)
Admission: RE | Admit: 2020-11-15 | Discharge: 2020-11-15 | Disposition: A | Discharge status: Home / Self Care | Payer: Medicare Other | Source: Ambulatory Visit | Attending: Family Medicine | Admitting: Family Medicine

## 2020-11-15 ENCOUNTER — Other Ambulatory Visit: Payer: Self-pay

## 2020-11-15 DIAGNOSIS — J449 Chronic obstructive pulmonary disease, unspecified: Secondary | ICD-10-CM | POA: Insufficient documentation

## 2020-11-15 DIAGNOSIS — I351 Nonrheumatic aortic (valve) insufficiency: Secondary | ICD-10-CM | POA: Diagnosis not present

## 2020-11-15 DIAGNOSIS — I10 Essential (primary) hypertension: Secondary | ICD-10-CM | POA: Diagnosis not present

## 2020-11-15 DIAGNOSIS — R011 Cardiac murmur, unspecified: Secondary | ICD-10-CM | POA: Insufficient documentation

## 2020-11-15 DIAGNOSIS — E785 Hyperlipidemia, unspecified: Secondary | ICD-10-CM | POA: Diagnosis not present

## 2020-11-15 LAB — ECHOCARDIOGRAM COMPLETE
Area-P 1/2: 3.5 cm2
Calc EF: 66.2 %
P 1/2 time: 341 msec
S' Lateral: 2.3 cm
Single Plane A2C EF: 66.9 %
Single Plane A4C EF: 66.5 %

## 2020-11-24 DIAGNOSIS — J449 Chronic obstructive pulmonary disease, unspecified: Secondary | ICD-10-CM | POA: Diagnosis not present

## 2020-11-24 DIAGNOSIS — R0602 Shortness of breath: Secondary | ICD-10-CM | POA: Diagnosis not present

## 2020-12-03 NOTE — Progress Notes (Signed)
Results faxed to requested MD.

## 2020-12-08 ENCOUNTER — Ambulatory Visit: Payer: Medicare Other

## 2020-12-15 DIAGNOSIS — J302 Other seasonal allergic rhinitis: Secondary | ICD-10-CM | POA: Diagnosis not present

## 2020-12-15 DIAGNOSIS — R0981 Nasal congestion: Secondary | ICD-10-CM | POA: Diagnosis not present

## 2020-12-21 ENCOUNTER — Other Ambulatory Visit: Payer: Self-pay | Admitting: Family Medicine

## 2020-12-21 DIAGNOSIS — R569 Unspecified convulsions: Secondary | ICD-10-CM

## 2020-12-24 DIAGNOSIS — J479 Bronchiectasis, uncomplicated: Secondary | ICD-10-CM | POA: Diagnosis not present

## 2021-01-04 ENCOUNTER — Ambulatory Visit: Payer: Medicare Other

## 2021-01-05 ENCOUNTER — Other Ambulatory Visit: Payer: Self-pay | Admitting: Family Medicine

## 2021-01-05 DIAGNOSIS — G4710 Hypersomnia, unspecified: Secondary | ICD-10-CM

## 2021-01-05 NOTE — Telephone Encounter (Signed)
Refill request for: Dextroamphetamine 10 mg LR 11/10/20, #120, 0 rf LOV  11/03/20, Dr Bryan Lemma FOV  02/17/21, Dr Ethelene Hal   Please review and advise. Thanks. Dm/cma

## 2021-01-06 DIAGNOSIS — S63613A Unspecified sprain of left middle finger, initial encounter: Secondary | ICD-10-CM | POA: Diagnosis not present

## 2021-01-06 DIAGNOSIS — S63615A Unspecified sprain of left ring finger, initial encounter: Secondary | ICD-10-CM | POA: Diagnosis not present

## 2021-01-07 DIAGNOSIS — R0602 Shortness of breath: Secondary | ICD-10-CM | POA: Diagnosis not present

## 2021-01-07 DIAGNOSIS — J449 Chronic obstructive pulmonary disease, unspecified: Secondary | ICD-10-CM | POA: Diagnosis not present

## 2021-01-10 DIAGNOSIS — H524 Presbyopia: Secondary | ICD-10-CM | POA: Diagnosis not present

## 2021-01-10 DIAGNOSIS — H1045 Other chronic allergic conjunctivitis: Secondary | ICD-10-CM | POA: Diagnosis not present

## 2021-01-10 DIAGNOSIS — H40013 Open angle with borderline findings, low risk, bilateral: Secondary | ICD-10-CM | POA: Diagnosis not present

## 2021-01-10 DIAGNOSIS — H04123 Dry eye syndrome of bilateral lacrimal glands: Secondary | ICD-10-CM | POA: Diagnosis not present

## 2021-01-10 DIAGNOSIS — H52223 Regular astigmatism, bilateral: Secondary | ICD-10-CM | POA: Diagnosis not present

## 2021-01-11 DIAGNOSIS — D2270 Melanocytic nevi of unspecified lower limb, including hip: Secondary | ICD-10-CM | POA: Diagnosis not present

## 2021-01-11 DIAGNOSIS — L821 Other seborrheic keratosis: Secondary | ICD-10-CM | POA: Diagnosis not present

## 2021-01-11 DIAGNOSIS — D692 Other nonthrombocytopenic purpura: Secondary | ICD-10-CM | POA: Diagnosis not present

## 2021-01-11 DIAGNOSIS — D2260 Melanocytic nevi of unspecified upper limb, including shoulder: Secondary | ICD-10-CM | POA: Diagnosis not present

## 2021-01-11 DIAGNOSIS — D225 Melanocytic nevi of trunk: Secondary | ICD-10-CM | POA: Diagnosis not present

## 2021-01-11 DIAGNOSIS — Z872 Personal history of diseases of the skin and subcutaneous tissue: Secondary | ICD-10-CM | POA: Diagnosis not present

## 2021-01-11 DIAGNOSIS — D1801 Hemangioma of skin and subcutaneous tissue: Secondary | ICD-10-CM | POA: Diagnosis not present

## 2021-01-11 DIAGNOSIS — Z85828 Personal history of other malignant neoplasm of skin: Secondary | ICD-10-CM | POA: Diagnosis not present

## 2021-01-11 DIAGNOSIS — L814 Other melanin hyperpigmentation: Secondary | ICD-10-CM | POA: Diagnosis not present

## 2021-01-18 DIAGNOSIS — M79645 Pain in left finger(s): Secondary | ICD-10-CM | POA: Diagnosis not present

## 2021-01-18 DIAGNOSIS — S6992XA Unspecified injury of left wrist, hand and finger(s), initial encounter: Secondary | ICD-10-CM | POA: Diagnosis not present

## 2021-01-20 DIAGNOSIS — Z0000 Encounter for general adult medical examination without abnormal findings: Secondary | ICD-10-CM | POA: Diagnosis not present

## 2021-01-24 DIAGNOSIS — N202 Calculus of kidney with calculus of ureter: Secondary | ICD-10-CM | POA: Diagnosis not present

## 2021-01-24 DIAGNOSIS — N281 Cyst of kidney, acquired: Secondary | ICD-10-CM | POA: Diagnosis not present

## 2021-01-24 DIAGNOSIS — N319 Neuromuscular dysfunction of bladder, unspecified: Secondary | ICD-10-CM | POA: Diagnosis not present

## 2021-02-17 ENCOUNTER — Encounter: Payer: Self-pay | Admitting: Family Medicine

## 2021-02-17 ENCOUNTER — Other Ambulatory Visit: Payer: Self-pay

## 2021-02-17 ENCOUNTER — Ambulatory Visit (INDEPENDENT_AMBULATORY_CARE_PROVIDER_SITE_OTHER): Payer: Medicare Other | Admitting: Family Medicine

## 2021-02-17 DIAGNOSIS — R569 Unspecified convulsions: Secondary | ICD-10-CM

## 2021-02-17 MED ORDER — PHENOBARBITAL 32.4 MG PO TABS
ORAL_TABLET | ORAL | 1 refills | Status: DC
Start: 2021-02-17 — End: 2021-06-28

## 2021-02-17 NOTE — Progress Notes (Signed)
Established Patient Office Visit  Subjective:  Patient ID: [name], female    DOB: 1933-03-28  Age: 87 y.o. MRN: [id number]  CC:  Chief Complaint  Patient presents with   Transitions Of Care    TOC from Dr. Bryan Lemma, no concerns.     HPI Madeleine Fenn presents for establishment of care by way of transfer.  She is accompanied by her husband.  Significant past medical history of HTN, HLD, hypothyroidism, vitamin D deficiency, hypersomnia.  Followed by [facility] for CIDP, chronic inflammatory demyelinating polyneuropathy, rheumatology for Raynaud's, pulmonology for COPD and GI.  Blood pressures controlled with amlodipine.  LDL cholesterol at 124 with an HDL of 90.7 on 40 of atorvastatin.  Hypothyroidism controlled with low-dose levothyroxine.  Hypersomnia is controlled with 10 mg of Dextrostat 4 times daily.  Patient fell a couple of months ago and broke her right fourth finger.  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 ([place]) 03/2011   chronic sinusitis 08/08/2012   Colon polyp    Constipation    COPD (chronic obstructive pulmonary disease) ([place])    Cystocele 09/16/2012   GERD (gastroesophageal reflux disease)    Hyperlipidemia    Hypertension    Hypothyroid    Neurogenic bladder 2013   husband caths pt, manages with timed void   Osteoporosis    Pulmonary embolism ([place])    1980s   Raynaud phenomenon    Renal cyst    Non-complex, contrast MRI 2013 with L>R non-complex cysts, dominant 3.7 left lower pole   Seizures ([place])    1990 last seizures on meds Phenobarb   Shingles late 90's   Skin cancer    squamous cell   Thyroid nodule    Right thyroid lobe, only seen on Sagittal imaging measures 2.3 cm in craniocaudal dimension and appears  stable   Tubular adenoma    Vitamin D deficiency     Past Surgical History:  Procedure Laterality Date   ABDOMINAL HYSTERECTOMY     ANTERIOR FUSION CLIVUS-C2 EXTRAORAL W/ ODONTOID EXCISION  8/14   APPENDECTOMY     BASAL CELL CARCINOMA EXCISION     face   BLADDER SUSPENSION     BREAST DUCTAL SYSTEM EXCISION Right 01/15/2014   Procedure: EXCISION DUCTAL SYSTEM RIGHT BREAST;  Surgeon: Stark Klein, MD;  Location: [facility];  Service: General;  Laterality: Right;   BREAST EXCISIONAL BIOPSY Right    CARPAL TUNNEL RELEASE     right   CATARACT EXTRACTION     bilateral   cervical neck ablation     x 7, C3-C6/3 screws and plate   CESAREAN SECTION     CYSTOCELE REPAIR     DILATION AND CURETTAGE OF UTERUS     duptyren's contracture right hand     FINGER ARTHRODESIS Right 05/06/2015   Procedure: RIGHT RING PROXIMAL INTERPHALANGEAL FUSION (PIP);  Surgeon: Leanora Cover, MD;  Location: [facility];  Service: Orthopedics;  Laterality: Right;   FINGER GANGLION CYST EXCISION     right   HEMICOLECTOMY Right    JOINT REPLACEMENT     right and left basal joints of thumbs   left finger fusion     3  fingers on left hand/one finger right   left ovary and tube removed     NASAL POLYP SURGERY     4 sinus surgeries   NASAL SEPTUM SURGERY     PANNICULECTOMY     PROXIMAL INTERPHALANGEAL FUSION (PIP) Left 09/09/2013   Procedure: FUSION LEFT INDEX PROXIMAL INTERPHALANGEAL JOINT (PIP);  Surgeon: Cammie Sickle., MD;  Location: [facility];  Service: Orthopedics;  Laterality: Left;   right median nerve decompression     SHOULDER ARTHROSCOPY     x2 left, 1 right   sinus surgeries     x 4   squamous lesions removed     neck and face   STERIOD INJECTION Right 05/06/2015   Procedure: STEROID INJECTION;  Surgeon: Leanora Cover, MD;  Location: [facility];  Service: Orthopedics;  Laterality: Right;  Right Index Finger Proximal InterPhalangeal Joint  Injection   TONSILLECTOMY AND ADENOIDECTOMY     TUBAL LIGATION     vocal polyps removed      Family History  Problem Relation Age of Onset   Heart disease Mother    Hyperlipidemia Mother        M, aunt   Ovarian cancer Sister    Lung cancer Sister        lung - stage 1   Thyroid cancer Sister    Colon cancer Father        55   Malignant hyperthermia Cousin    Stomach cancer Other        first cousin   Skin cancer Daughter    Breast cancer Neg Hx    Esophageal cancer Neg Hx    Pancreatic cancer Neg Hx     Social History   Socioeconomic History   Marital status: Married    Spouse name: Not on file   Number of children: 3   Years of education: Not on file   Highest education level: Not on file  Occupational History   Occupation: retired, Licensed conveyancer    Employer: RETIRED  Tobacco Use   Smoking status: Former    Packs/day: 0.50    Years: 3.00    Pack years: 1.50    Types: Cigarettes    Quit date: 05/29/1978    Years since quitting: 42.7   Smokeless tobacco: Never   Tobacco comments:    quit smoking 35 years ago  Vaping Use   Vaping Use: Never used  Substance and Sexual Activity   Alcohol use: No   Drug use: No   Sexual activity: Yes  Other Topics Concern   Not on file  Social History Narrative   Pt lives at home with her husband. She is originally from [place]. Moved to [place] in 2005.     3 grown children - Son is a Pharmacist, community in the area, son in [place], daughter in [place]   Pt is a retired Marine scientist   Social Determinants of Sales executive: Not on Art therapist Insecurity: Not on file  Transportation Needs: Not on file  Physical Activity: Not on file  Stress: Not on file  Social Connections: Not on file  Intimate Partner Violence: Not on file    Outpatient Medications Prior to Visit  Medication Sig Dispense Refill   albuterol (PROVENTIL) (2.5 MG/3ML) 0.083% nebulizer solution USE 1 VIAL IN NEBULIZER 4 TIMES DAILY     amLODipine  (NORVASC) 10 MG tablet TAKE 1 TABLET BY MOUTH DAILY 90 tablet 3  arformoterol (BROVANA) 15 MCG/2ML NEBU Take 2 mLs (15 mcg total) by nebulization 2 (two) times daily. 120 mL 11   atorvastatin (LIPITOR) 40 MG tablet TAKE 1 TABLET BY MOUTH AT BEDTIME 90 tablet 0   budesonide (PULMICORT) 0.25 MG/2ML nebulizer solution Take 2 mLs (0.25 mg total) by nebulization 2 (two) times daily. 60 mL 11   Calcium Carb-Cholecalciferol 500-125 MG-UNIT TABS Take 1,200 mg by mouth.     Camphor-Menthol-Methyl Sal (SALONPAS) 3.06-03-08 % PTCH Apply topically.     [facility] BUDESONIDE 32 MCG/ACT nasal spray ADD 1ML OF MEDICATION ADD TO 250ML OF SALINE IN SALINE IRRIGATION BOTTLE; IRRIGATE SINUSES WITH 120ML THROUGH EACH NOSTRIL ONCE DAILY     dextroamphetamine (DEXTROSTAT) 10 MG tablet TAKE 1 TABLET BY MOUTH IN THE MORNING, AT NOON, IN THE EVENING, AND AT BEDTIME 120 tablet 0   gabapentin (NEURONTIN) 300 MG capsule Take 300 mg by mouth at bedtime.     levothyroxine (SYNTHROID) 25 MCG tablet TAKE ONE (1) TABLET BY MOUTH EVERY DAY WITH BREAKFAST 90 tablet 3   mometasone (NASONEX) 50 MCG/ACT nasal spray PLACE 2 SPRAYS INTO THE NOSE DAILY 17 g 6   montelukast (SINGULAIR) 10 MG tablet Take 1 tablet (10 mg total) by mouth at bedtime. 90 tablet 1   mupirocin ointment (BACTROBAN) 2 % ADD ONE INCH OF OINTMENT TO 8 OUNCES OF SINUS RINSE BOTTLE DAILY     NON FORMULARY Salon pas pain patches/ PRN     polyethylene glycol powder (GLYCOLAX/MIRALAX) powder Take 3/4 capful dissolved in at least 8 ounces water/juice once daily. May titrate as needed 1530 g 3   predniSONE (DELTASONE) 10 MG tablet Take 10 mg by mouth every morning.     VENTOLIN HFA 108 (90 Base) MCG/ACT inhaler SMARTSIG:1 Puff(s) Via Inhaler Every 4 Hours PRN     PHENobarbital (LUMINAL) 32.4 MG tablet TAKE ONE (1) TABLET BY MOUTH EVERY DAY AT 6AM AND TAKE TWO TABLETS DAILY AT 8PM 180 tablet 0   Cholecalciferol 25 MCG (1000 UT) capsule Take by mouth.     sodium chloride (OCEAN)  0.65 % nasal spray Place into the nose.     TEGRETOL-XR 100 MG 12 hr tablet Take 500 mg by mouth daily. Takes 2 tabs at night  1   triamcinolone (KENALOG) 0.1 % Apply topically 2 (two) times daily.     Vitamin D, Ergocalciferol, (DRISDOL) 1.25 MG (50000 UT) CAPS capsule Take 1 capsule (50,000 Units total) by mouth every 7 (seven) days. 5 capsule 2   No facility-administered medications prior to visit.    Allergies  Allergen Reactions   Iodinated Diagnostic Agents Anaphylaxis    Cardiac arrest   Iodine Other (See Comments)    Cardiac arrest As young adult, received iodinated contrast for IVP, went into cardiac arrest, was resuscitated and hospitalized. Has not received iodinated contrast since that episode. Reports no adverse reaction to betadine.    Morphine Other (See Comments)    Cardiac arrest Patient has previously tolerated hydrocodone, hydromorphone and fentanyl    Morphine And Related Other (See Comments)    Cardiac arrest   Metrizamide Other (See Comments)    Cardiac arrest Cardiac arrest     ROS Review of Systems  Constitutional:  Positive for fatigue. Negative for fever and unexpected weight change.  Respiratory: Negative.    Cardiovascular: Negative.   Gastrointestinal: Negative.   Neurological:  Positive for seizures.  Depression screen [facility] 2/9 02/17/2021 12/03/2019 10/31/2019  Decreased Interest 0 0 0  Down,  Depressed, Hopeless 0 0 0  PHQ - 2 Score 0 0 0  Altered sleeping - 0 0  Tired, decreased energy - 2 0  Change in appetite - 0 0  Feeling bad or failure about yourself  - 0 0  Trouble concentrating - 0 0  Moving slowly or fidgety/restless - 0 0  Suicidal thoughts - 0 0  PHQ-9 Score - 2 0  Difficult doing work/chores - Not difficult at all Not difficult at all  Some recent data might be hidden       Objective:    Physical Exam Vitals and nursing note reviewed.  Constitutional:      General: She is not in acute distress.    Appearance: Normal  appearance. She is normal weight. She is not ill-appearing, toxic-appearing or diaphoretic.  HENT:     Head: Normocephalic and atraumatic.  Eyes:     General: No scleral icterus.       Right eye: No discharge.        Left eye: No discharge.     Conjunctiva/sclera: Conjunctivae normal.  Pulmonary:     Effort: Pulmonary effort is normal.  Neurological:     Mental Status: She is alert and oriented to person, place, and time.  Psychiatric:        Mood and Affect: Mood normal.        Behavior: Behavior normal.    BP 138/68 (BP Location: Right Arm, Patient Position: Sitting, Cuff Size: Normal)   Pulse 89   Temp (!) 97.5 F (36.4 C) (Temporal)   Ht 5\' 2"  (1.575 m)   Wt 124 lb 9.6 oz (56.5 kg)   SpO2 97%   BMI 22.79 kg/m  Wt Readings from Last 3 Encounters:  02/17/21 124 lb 9.6 oz (56.5 kg)  11/03/20 123 lb 12.8 oz (56.2 kg)  06/30/20 124 lb (56.2 kg)     There are no preventive care reminders to display for this patient.  There are no preventive care reminders to display for this patient.  Lab Results  Component Value Date   TSH 1.21 11/03/2020   Lab Results  Component Value Date   WBC 5.0 11/03/2020   HGB 14.1 11/03/2020   HCT 42.5 11/03/2020   MCV 92.7 11/03/2020   PLT 254.0 11/03/2020   Lab Results  Component Value Date   NA 142 11/03/2020   K 4.2 11/03/2020   CO2 26 11/03/2020   GLUCOSE 98 11/03/2020   BUN 32 (H) 11/03/2020   CREATININE 0.90 11/03/2020   BILITOT 0.4 11/03/2020   ALKPHOS 76 11/03/2020   AST 22 11/03/2020   ALT 19 11/03/2020   PROT 6.8 11/03/2020   ALBUMIN 4.2 11/03/2020   CALCIUM 9.2 11/03/2020   ANIONGAP 6 05/03/2015   GFR 58.02 (L) 11/03/2020   Lab Results  Component Value Date   CHOL 236 (H) 11/03/2020   Lab Results  Component Value Date   HDL 90.70 11/03/2020   Lab Results  Component Value Date   LDLCALC 124 (H) 11/03/2020   Lab Results  Component Value Date   TRIG 106.0 11/03/2020   Lab Results  Component Value Date    CHOLHDL 3 11/03/2020   No results found for: HGBA1C    Assessment & Plan:   Problem List Items Addressed This Visit       Other   Seizures ([place])   Relevant Medications   PHENobarbital (LUMINAL) 32.4 MG tablet    Meds ordered this encounter  Medications  PHENobarbital (LUMINAL) 32.4 MG tablet    Sig: Take one tablet by mouth at 6A and then 2 at 8P    Dispense:  180 tablet    Refill:  1     Follow-up: Return in about 6 months (around 08/17/2021).  Refilled her phenobarbital with current dosing regiment.  Advised to follow-up with [facility] for future refills.  She will be seeing them in December.  While she has been on phenobarbital for some time now I would hope there would be better medications for her seizure disorder, especially with her history of hypersomnia.  Spent 30 minutes reviewing the chart, speaking with the patient and her husband.  Libby Maw, MD

## 2021-02-28 ENCOUNTER — Other Ambulatory Visit: Payer: Self-pay

## 2021-02-28 MED ORDER — ATORVASTATIN CALCIUM 40 MG PO TABS: 40.0000 mg | ORAL_TABLET | Freq: Every day | ORAL | 1 refills | Status: DC

## 2021-03-04 DIAGNOSIS — J45909 Unspecified asthma, uncomplicated: Secondary | ICD-10-CM | POA: Diagnosis not present

## 2021-03-04 DIAGNOSIS — R0602 Shortness of breath: Secondary | ICD-10-CM | POA: Diagnosis not present

## 2021-03-04 DIAGNOSIS — J329 Chronic sinusitis, unspecified: Secondary | ICD-10-CM | POA: Diagnosis not present

## 2021-03-07 DIAGNOSIS — Z23 Encounter for immunization: Secondary | ICD-10-CM | POA: Diagnosis not present

## 2021-03-08 ENCOUNTER — Telehealth: Payer: Self-pay | Admitting: Family Medicine

## 2021-03-08 DIAGNOSIS — Z23 Encounter for immunization: Secondary | ICD-10-CM | POA: Diagnosis not present

## 2021-03-08 NOTE — Telephone Encounter (Signed)
Pt called [facility] wanting TOC to female MD or DO. Advised Dr. Raoul Pitch is scheduling into March 2023. Sent msg to other [facility] supervisors to ask if other female MD/DOs are taking TOC & availability.  Advised pt I will call her back.

## 2021-03-10 NOTE — Telephone Encounter (Signed)
LVM for pt and provided info for [facility], Dr. Esther Hardy, schedule will tentatively open on Dec 5th.   Worked with Tammy on this. Pt feels more comfortable with a female doctor and Dr. Letta Median has recently left the [facility] office. Pt feels a female is a better fit for her moving forward.

## 2021-04-27 ENCOUNTER — Other Ambulatory Visit: Payer: Self-pay | Admitting: Pulmonary Disease

## 2021-04-27 ENCOUNTER — Other Ambulatory Visit: Payer: Self-pay | Admitting: *Deleted

## 2021-04-27 DIAGNOSIS — Z1231 Encounter for screening mammogram for malignant neoplasm of breast: Secondary | ICD-10-CM

## 2021-04-29 DIAGNOSIS — R0602 Shortness of breath: Secondary | ICD-10-CM | POA: Diagnosis not present

## 2021-04-29 DIAGNOSIS — J449 Chronic obstructive pulmonary disease, unspecified: Secondary | ICD-10-CM | POA: Diagnosis not present

## 2021-04-29 DIAGNOSIS — J209 Acute bronchitis, unspecified: Secondary | ICD-10-CM | POA: Diagnosis not present

## 2021-05-02 ENCOUNTER — Other Ambulatory Visit: Payer: Self-pay

## 2021-05-02 DIAGNOSIS — I10 Essential (primary) hypertension: Secondary | ICD-10-CM

## 2021-05-02 MED ORDER — AMLODIPINE BESYLATE 10 MG PO TABS: 10.0000 mg | ORAL_TABLET | Freq: Every day | ORAL | 3 refills | Status: DC

## 2021-05-10 DIAGNOSIS — M4802 Spinal stenosis, cervical region: Secondary | ICD-10-CM | POA: Diagnosis not present

## 2021-05-10 DIAGNOSIS — G40909 Epilepsy, unspecified, not intractable, without status epilepticus: Secondary | ICD-10-CM | POA: Diagnosis not present

## 2021-05-10 DIAGNOSIS — R252 Cramp and spasm: Secondary | ICD-10-CM | POA: Diagnosis not present

## 2021-05-10 DIAGNOSIS — G629 Polyneuropathy, unspecified: Secondary | ICD-10-CM | POA: Diagnosis not present

## 2021-05-19 NOTE — Telephone Encounter (Signed)
I have scheduled appt for pt.

## 2021-05-23 DIAGNOSIS — S20219A Contusion of unspecified front wall of thorax, initial encounter: Secondary | ICD-10-CM | POA: Diagnosis not present

## 2021-05-23 DIAGNOSIS — S301XXA Contusion of abdominal wall, initial encounter: Secondary | ICD-10-CM | POA: Diagnosis not present

## 2021-05-30 ENCOUNTER — Other Ambulatory Visit: Payer: Self-pay

## 2021-05-30 ENCOUNTER — Emergency Department (HOSPITAL_BASED_OUTPATIENT_CLINIC_OR_DEPARTMENT_OTHER)
Admission: EM | Admit: 2021-05-30 | Discharge: 2021-05-30 | Disposition: A | Discharge status: Home / Self Care | Payer: Medicare Other | Attending: Emergency Medicine | Admitting: Emergency Medicine

## 2021-05-30 ENCOUNTER — Encounter (HOSPITAL_BASED_OUTPATIENT_CLINIC_OR_DEPARTMENT_OTHER): Payer: Self-pay | Admitting: Emergency Medicine

## 2021-05-30 ENCOUNTER — Emergency Department (HOSPITAL_BASED_OUTPATIENT_CLINIC_OR_DEPARTMENT_OTHER): Payer: Medicare Other

## 2021-05-30 DIAGNOSIS — S20212A Contusion of left front wall of thorax, initial encounter: Secondary | ICD-10-CM

## 2021-05-30 DIAGNOSIS — R079 Chest pain, unspecified: Secondary | ICD-10-CM | POA: Diagnosis not present

## 2021-05-30 DIAGNOSIS — W010XXA Fall on same level from slipping, tripping and stumbling without subsequent striking against object, initial encounter: Secondary | ICD-10-CM

## 2021-05-30 DIAGNOSIS — Y92002 Bathroom of unspecified non-institutional (private) residence single-family (private) house as the place of occurrence of the external cause: Secondary | ICD-10-CM | POA: Diagnosis not present

## 2021-05-30 DIAGNOSIS — R911 Solitary pulmonary nodule: Secondary | ICD-10-CM | POA: Diagnosis not present

## 2021-05-30 DIAGNOSIS — J8410 Pulmonary fibrosis, unspecified: Secondary | ICD-10-CM | POA: Diagnosis not present

## 2021-05-30 DIAGNOSIS — S299XXA Unspecified injury of thorax, initial encounter: Secondary | ICD-10-CM | POA: Diagnosis not present

## 2021-05-30 DIAGNOSIS — Z981 Arthrodesis status: Secondary | ICD-10-CM | POA: Diagnosis not present

## 2021-05-30 DIAGNOSIS — W182XXA Fall in (into) shower or empty bathtub, initial encounter: Secondary | ICD-10-CM | POA: Diagnosis not present

## 2021-05-30 DIAGNOSIS — R0602 Shortness of breath: Secondary | ICD-10-CM | POA: Diagnosis not present

## 2021-05-30 MED ORDER — METHOCARBAMOL 500 MG PO TABS: 750.0000 mg | ORAL_TABLET | Freq: Three times a day (TID) | ORAL | 0 refills | Status: DC | PRN

## 2021-05-30 MED ORDER — TRAMADOL HCL 50 MG PO TABS
50.0000 mg | ORAL_TABLET | Freq: Once | ORAL | Status: AC
  Administered 2021-05-30: 50 mg via ORAL
  Filled 2021-05-30: qty 1

## 2021-05-30 NOTE — ED Triage Notes (Signed)
Pt fell one week ago in the bathtub, having left rib pain, bruising to same.  Pt started having sob today.

## 2021-05-30 NOTE — ED Provider Notes (Addendum)
[facility] EMERGENCY DEPARTMENT Provider Note   CSN: [id number] Arrival date & time: 05/30/21  0758     History  Chief Complaint  Patient presents with   [name] is a 88 y.o. female.  Patient c/o recent fall last week, with contusion to left lower chest. Pain to left lower ribs post fall, acute onset, dull, moderate, worse w palpation and certain movements, non radiating, constant. Denies sob. Was asymptomatic prior to fall, was mechanical fall in bathtub. No head injury or loc. No headache. No neck or back pain. No abd pain or nv. No extremity pain or injury. Has tried otc acetaminophen without relief.   The history is provided by the patient, the spouse and medical records.  Fall Pertinent negatives include no abdominal pain, no headaches and no shortness of breath.      Home Medications Prior to Admission medications   Medication Sig Start Date End Date Taking? Authorizing Provider  albuterol (PROVENTIL) (2.5 MG/3ML) 0.083% nebulizer solution USE 1 VIAL IN NEBULIZER 4 TIMES DAILY 02/24/19   [provider]  amLODipine (NORVASC) 10 MG tablet Take 1 tablet (10 mg total) by mouth daily. 05/02/21   Libby Maw, MD  arformoterol (BROVANA) 15 MCG/2ML NEBU Take 2 mLs (15 mcg total) by nebulization 2 (two) times daily. 10/15/14   Elsie Stain, MD  atorvastatin (LIPITOR) 40 MG tablet Take 1 tablet (40 mg total) by mouth at bedtime. 02/28/21   Libby Maw, MD  budesonide (PULMICORT) 0.25 MG/2ML nebulizer solution Take 2 mLs (0.25 mg total) by nebulization 2 (two) times daily. 10/15/14   Elsie Stain, MD  Calcium Carb-Cholecalciferol 500-125 MG-UNIT TABS Take 1,200 mg by mouth.    [provider]  Camphor-Menthol-Methyl Sal (SALONPAS) 3.06-03-08 % PTCH Apply topically.    [provider]  Cholecalciferol 25 MCG (1000 UT) capsule Take by mouth.    [provider]  [employer] BUDESONIDE 32 MCG/ACT nasal  spray ADD 1ML OF MEDICATION ADD TO 250ML OF SALINE IN SALINE IRRIGATION BOTTLE; IRRIGATE SINUSES WITH 120ML THROUGH EACH NOSTRIL ONCE DAILY 09/28/20   [provider]  dextroamphetamine (DEXTROSTAT) 10 MG tablet TAKE 1 TABLET BY MOUTH IN THE MORNING, AT NOON, IN THE EVENING, AND AT BEDTIME 01/05/21   Libby Maw, MD  gabapentin (NEURONTIN) 300 MG capsule Take 300 mg by mouth at bedtime. 07/21/19   [provider]  levothyroxine (SYNTHROID) 25 MCG tablet TAKE ONE (1) TABLET BY MOUTH EVERY DAY WITH BREAKFAST 07/26/20   Cirigliano, Mary K, DO  mometasone (NASONEX) 50 MCG/ACT nasal spray PLACE 2 SPRAYS INTO THE NOSE DAILY 10/15/14   Elsie Stain, MD  montelukast (SINGULAIR) 10 MG tablet Take 1 tablet (10 mg total) by mouth at bedtime. 12/29/15   Parrett, Fonnie Mu, NP  mupirocin ointment (BACTROBAN) 2 % ADD ONE INCH OF OINTMENT TO 8 OUNCES OF SINUS RINSE BOTTLE DAILY 07/20/16   [provider]  NON FORMULARY Salon pas pain patches/ PRN    [provider]  PHENobarbital (LUMINAL) 32.4 MG tablet Take one tablet by mouth at 6A and then 2 at [facility] 02/17/21   Libby Maw, MD  polyethylene glycol powder [facility]) powder Take 3/4 capful dissolved in at least 8 ounces water/juice once daily. May titrate as needed 05/16/18   Pyrtle, Lajuan Lines, MD  predniSONE (DELTASONE) 10 MG tablet Take 10 mg by mouth every morning. 11/04/20   [provider]  sodium chloride (  OCEAN) 0.65 % nasal spray Place into the nose.    [provider]  TEGRETOL-XR 100 MG 12 hr tablet Take 500 mg by mouth daily. Takes 2 tabs at night 03/25/15   [provider]  triamcinolone (KENALOG) 0.1 % Apply topically 2 (two) times daily. 06/17/20   [provider]  VENTOLIN HFA 108 (90 Base) MCG/ACT inhaler SMARTSIG:1 Puff(s) Via Inhaler Every 4 Hours PRN 06/10/19   [provider]  Vitamin D, Ergocalciferol, (DRISDOL) 1.25 MG (50000 UT) CAPS capsule Take 1  capsule (50,000 Units total) by mouth every 7 (seven) days. 05/07/19   Cirigliano, Garvin Fila, DO      Allergies    Iodinated contrast media, Iodine, Morphine, Morphine and related, and Metrizamide    Review of Systems   Review of Systems  Constitutional:  Negative for chills and fever.  HENT:  Negative for sore throat.   Eyes:  Negative for redness.  Respiratory:  Negative for cough and shortness of breath.   Cardiovascular:  Negative for leg swelling.  Gastrointestinal:  Negative for abdominal pain and vomiting.  Genitourinary:  Negative for flank pain.  Musculoskeletal:  Negative for back pain and neck pain.  Skin:  Negative for wound.  Neurological:  Negative for headaches.  Hematological:  Does not bruise/bleed easily.  Psychiatric/Behavioral:  Negative for confusion.    Physical Exam Updated Vital Signs BP (!) 129/114 (BP Location: Right Arm)    Pulse 90    Temp 98.1 F (36.7 C) (Oral)    Resp 16    Ht 1.575 m (5\' 2" )    Wt 58.5 kg    SpO2 99%    BMI 23.59 kg/m  Physical Exam Vitals and nursing note reviewed.  Constitutional:      Appearance: Normal appearance. She is well-developed.  HENT:     Head: Atraumatic.     Nose: Nose normal.     Mouth/Throat:     Mouth: Mucous membranes are moist.  Eyes:     General: No scleral icterus.    Conjunctiva/sclera: Conjunctivae normal.  Neck:     Trachea: No tracheal deviation.  Cardiovascular:     Rate and Rhythm: Normal rate and regular rhythm.     Pulses: Normal pulses.     Heart sounds: Normal heart sounds. No murmur heard.   No friction rub. No gallop.  Pulmonary:     Effort: Pulmonary effort is normal. No respiratory distress.     Breath sounds: Normal breath sounds.     Comments: Left lower chest wall tenderness reproducing symptoms. No crepitus. Normal chest wall movement.  Chest:     Chest wall: Tenderness present.  Abdominal:     General: Bowel sounds are normal. There is no distension.     Palpations: Abdomen is  soft.     Tenderness: There is no abdominal tenderness. There is no guarding.  Genitourinary:    Comments: No cva tenderness.  Musculoskeletal:        General: No swelling.     Cervical back: Normal range of motion and neck supple. No rigidity. No muscular tenderness.     Comments: CTLS spine, non tender, aligned, no step off. Good rom bil extremities - no focal pain or bony tenderness.   Skin:    General: Skin is warm and dry.     Findings: No rash.  Neurological:     Mental Status: She is alert.     Comments: Alert, speech normal. GCS 15. Motor/sens grossly intact.  Psychiatric:        Mood and Affect: Mood normal.    ED Results / Procedures / Treatments   Labs (all labs ordered are listed, but only abnormal results are displayed) Labs Reviewed - No data to display  EKG None  Radiology DG Ribs Unilateral W/Chest Left  Result Date: 05/30/2021 CLINICAL DATA:  Trauma, fall, pain EXAM: LEFT RIBS AND CHEST - 3+ VIEW COMPARISON:  Chest radiograph done on 08/20/2012 FINDINGS: Cardiac size is within normal limits. Lung fields are clear of any infiltrates or pulmonary edema. There is no focal pulmonary consolidation. There is calcified granuloma in the medial left lower lung fields. There is blunting of left lateral CP angle. There is no pneumothorax. No displaced fractures are seen in the left ribs. There is previous surgical fusion in the lower cervical spine. IMPRESSION: There are no focal pulmonary infiltrates. Blunting of left lateral CP angle suggests small effusion. There is no pneumothorax. No displaced fracture is seen in the left ribs. Electronically Signed   By: Elmer Picker M.D.   On: 05/30/2021 09:19   CT Chest Wo Contrast  Result Date: 05/30/2021 CLINICAL DATA:  Chest trauma, blunt rule out rib fracture. Patient fell 1 week ago in the bathtub. Having left hip pain. Left bruising. Shortness of breath. EXAM: CT CHEST WITHOUT CONTRAST TECHNIQUE: Multidetector CT imaging of  the chest was performed following the standard protocol without IV contrast. COMPARISON:  None. FINDINGS: Cardiovascular: Normal heart size. No pericardial effusion. Coronary artery atherosclerotic calcifications. Atherosclerotic calcification of aortic arch and descending thoracic aorta which is normal in caliber. Mediastinum/Nodes: Hypodense nodule in the right thyroid lobe with calcification. No enlarged mediastinal or axillary lymph nodes. Small hiatal hernia. Trachea and central bronchi are patent. Lungs/Pleura: Lungs are clear. No pleural effusion or pneumothorax. 5 mm calcified granuloma in the left lower lobe. Upper Abdomen: Large hypodense cyst in the lower pole of the left kidney. Nonobstructing calculus in the upper pole of the left kidney. Mild pancreatic atrophy. Bowel loops are normal in caliber. Musculoskeletal: No fracture is seen. Degenerative disc disease of the thoracic spine with osteopenia. IMPRESSION: 1.  No displaced rib fracture. 2.  No evidence of pneumonia of acute cardiopulmonary process. 3. Nonobstructing calculus in the left kidney and a large left renal cysts. 4.  Small hiatal hernia. Electronically Signed   By: Keane Police D.O.   On: 05/30/2021 11:02    Procedures Procedures    Medications Ordered in ED Medications  traMADol (ULTRAM) tablet 50 mg (has no administration in time range)    ED Course/ Medical Decision Making/ A&P                           Medical Decision Making  Imaging ordered.   Diff dx includes rib fractures/contusion, ptx.  Need for parenteral meds considered.   Reviewed nursing notes and prior charts for additional history.  Outside records reviewed. Additional hx from spouse.   Additional hx from spouse.   Xrays reviewed/interpreted by me - no fx or ptx. Family/pt mention possible ct to definitely r/o fx - will get.   Ultram po.   Recheck pt, pain improved.   CT reviewed/interpreted by me - no fx. Incidental findings shared - pcp  f/u.  Rx provided.         Final Clinical Impression(s) / ED Diagnoses Final diagnoses:  None    Rx / DC Orders ED Discharge Orders  None           Lajean Saver, MD 05/30/21 [phone]

## 2021-05-30 NOTE — Discharge Instructions (Addendum)
It was our pleasure to provide your ER care today - we hope that you feel better.  Take acetaminophen or ibuprofen as need for pain. You may also take robaxin as need for muscle pain/spasm - no driving when taking.   Your scans show no acute fracture. Incidental note was made [id number]: 1.  No displaced rib fracture.  2.  No evidence of pneumonia of acute cardiopulmonary process. 3. Nonobstructing calculus in the left kidney and a large left renal cysts. 4.  Small hiatal hernia.   Follow up with primary care doctor in 1-2 week if symptoms fail to improve/resolve. Also follow up with your doctor/discuss with your doctor the incidental findings noted on above scan.   Return to ER if worse, new symptoms, fevers, increased trouble breathing, or other concern.

## 2021-06-01 ENCOUNTER — Ambulatory Visit: Payer: Medicare Other

## 2021-06-01 DIAGNOSIS — R55 Syncope and collapse: Secondary | ICD-10-CM | POA: Diagnosis not present

## 2021-06-01 DIAGNOSIS — E559 Vitamin D deficiency, unspecified: Secondary | ICD-10-CM | POA: Diagnosis not present

## 2021-06-01 DIAGNOSIS — E7800 Pure hypercholesterolemia, unspecified: Secondary | ICD-10-CM | POA: Diagnosis not present

## 2021-06-03 DIAGNOSIS — S20219A Contusion of unspecified front wall of thorax, initial encounter: Secondary | ICD-10-CM | POA: Diagnosis not present

## 2021-06-03 DIAGNOSIS — J449 Chronic obstructive pulmonary disease, unspecified: Secondary | ICD-10-CM | POA: Diagnosis not present

## 2021-06-28 ENCOUNTER — Encounter: Payer: Self-pay | Admitting: Family Medicine

## 2021-06-28 ENCOUNTER — Other Ambulatory Visit: Payer: Self-pay

## 2021-06-28 ENCOUNTER — Ambulatory Visit (INDEPENDENT_AMBULATORY_CARE_PROVIDER_SITE_OTHER): Payer: Medicare Other | Admitting: Family Medicine

## 2021-06-28 VITALS — BP 142/70 | HR 91 | Temp 98.3°F | Ht 62.0 in | Wt 128.4 lb

## 2021-06-28 DIAGNOSIS — E030 Congenital hypothyroidism with diffuse goiter: Secondary | ICD-10-CM | POA: Diagnosis not present

## 2021-06-28 DIAGNOSIS — G6181 Chronic inflammatory demyelinating polyneuritis: Secondary | ICD-10-CM | POA: Diagnosis not present

## 2021-06-28 DIAGNOSIS — R569 Unspecified convulsions: Secondary | ICD-10-CM | POA: Diagnosis not present

## 2021-06-28 DIAGNOSIS — J449 Chronic obstructive pulmonary disease, unspecified: Secondary | ICD-10-CM | POA: Diagnosis not present

## 2021-06-28 DIAGNOSIS — I10 Essential (primary) hypertension: Secondary | ICD-10-CM | POA: Diagnosis not present

## 2021-06-28 MED ORDER — AMLODIPINE BESYLATE 10 MG PO TABS: 10.0000 mg | ORAL_TABLET | Freq: Every day | ORAL | 3 refills | Status: DC

## 2021-06-28 MED ORDER — PHENOBARBITAL 32.4 MG PO TABS: ORAL_TABLET | ORAL | 1 refills | Status: DC

## 2021-06-28 MED ORDER — LEVOTHYROXINE SODIUM 25 MCG PO TABS: 25.0000 ug | ORAL_TABLET | Freq: Every day | ORAL | 3 refills | Status: DC

## 2021-06-28 MED ORDER — MONTELUKAST SODIUM 10 MG PO TABS: 10.0000 mg | ORAL_TABLET | Freq: Every day | ORAL | 3 refills | Status: DC

## 2021-06-28 NOTE — Patient Instructions (Signed)
It was very nice to see you today!  Great to meet you.     PLEASE NOTE:  If you had any lab tests please let us know if you have not heard back within a few days. You may see your results on MyChart before we have a chance to review them but we will give you a call once they are reviewed by [place]. If we ordered any referrals today, please let us know if you have not heard from their office within the next week.   Please try these tips to maintain a healthy lifestyle:  Eat most of your calories during the day when you are active. Eliminate processed foods including packaged sweets (pies, cakes, cookies), reduce intake of potatoes, white bread, white pasta, and white rice. Look for whole grain options, oat flour or almond flour.  Each meal should contain half fruits/vegetables, one quarter protein, and one quarter carbs (no bigger than a computer mouse).  Cut down on sweet beverages. This includes juice, soda, and sweet tea. Also watch fruit intake, though this is a healthier sweet option, it still contains natural sugar! Limit to 3 servings daily.  Drink at least 1 glass of water with each meal and aim for at least 8 glasses per day  Exercise at least 150 minutes every week.

## 2021-06-28 NOTE — Progress Notes (Signed)
Subjective:     Patient ID: [name], female    DOB: 1933-04-11, 88 y.o.   MRN: [id number]  Chief Complaint  Patient presents with   Transfer of Care    Need medication refills    HPI-here w/husband TOC from [place].  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  Last sz 1991  COPD-not doing well.  Seeing pulm.  Inc to 20mg  but dec to 15 while on pain meds, doing ok till today-was accidentally only taking 5mg  pred and needing nebs mid day.  So just inc back to 15mg . HTN-140-160/70's.  No HA, dizziness,cp. Occ edema.  NAS Balance issues-has done PT.  Neuropathy.  Chronic numbness.    There are no preventive care reminders to display for this patient.  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 ([place]) 03/2011   chronic sinusitis 08/08/2012   Colon polyp    Constipation    COPD (chronic obstructive pulmonary disease) ([place])    Cystocele 09/16/2012   GERD (gastroesophageal reflux disease)    Hyperlipidemia    Hypertension    Hypothyroid    Neurogenic bladder 2013   husband caths pt, manages with timed void   Osteoporosis    Pulmonary embolism ([place])    1980s   Raynaud phenomenon    Renal cyst    Non-complex, contrast MRI 2013 with L>R non-complex cysts, dominant 3.7 left lower pole   Seizures ([place])    1990 last seizures on meds Phenobarb   Shingles late 90's   Skin cancer    squamous cell   Thyroid nodule    Right thyroid lobe, only seen on Sagittal imaging measures 2.3 cm in craniocaudal dimension and appears stable   Tubular adenoma    Vitamin D deficiency     Past Surgical History:  Procedure Laterality Date   ABDOMINAL HYSTERECTOMY     ANTERIOR FUSION CLIVUS-C2 EXTRAORAL W/ ODONTOID EXCISION  8/14    APPENDECTOMY     BASAL CELL CARCINOMA EXCISION     face   BLADDER SUSPENSION     BREAST DUCTAL SYSTEM EXCISION Right 01/15/2014   Procedure: EXCISION DUCTAL SYSTEM RIGHT BREAST;  Surgeon: Stark Klein, MD;  Location: [facility];  Service: General;  Laterality: Right;   BREAST EXCISIONAL BIOPSY Right    CARPAL TUNNEL RELEASE     right   CATARACT EXTRACTION     bilateral   cervical neck ablation     x 7, C3-C6/3 screws and plate   CESAREAN SECTION     CYSTOCELE REPAIR     DILATION AND CURETTAGE OF UTERUS     duptyren's contracture right hand     FINGER ARTHRODESIS Right 05/06/2015   Procedure: RIGHT RING PROXIMAL INTERPHALANGEAL FUSION (PIP);  Surgeon: Leanora Cover, MD;  Location: [facility];  Service: Orthopedics;  Laterality: Right;   FINGER GANGLION CYST EXCISION     right   HEMICOLECTOMY Right    JOINT REPLACEMENT     right and left basal joints of thumbs   left finger fusion     3 fingers on left hand/one finger right   left ovary and tube removed     NASAL POLYP SURGERY  4 sinus surgeries   NASAL SEPTUM SURGERY     PANNICULECTOMY     PROXIMAL INTERPHALANGEAL FUSION (PIP) Left 09/09/2013   Procedure: FUSION LEFT INDEX PROXIMAL INTERPHALANGEAL JOINT (PIP);  Surgeon: Cammie Sickle., MD;  Location: [facility];  Service: Orthopedics;  Laterality: Left;   right median nerve decompression     SHOULDER ARTHROSCOPY     x2 left, 1 right   sinus surgeries     x 4   squamous lesions removed     neck and face   STERIOD INJECTION Right 05/06/2015   Procedure: STEROID INJECTION;  Surgeon: Leanora Cover, MD;  Location: [facility];  Service: Orthopedics;  Laterality: Right;  Right Index Finger Proximal InterPhalangeal Joint Injection   TONSILLECTOMY AND ADENOIDECTOMY     TUBAL LIGATION     vocal polyps removed      Outpatient Medications Prior to Visit  Medication Sig Dispense Refill   albuterol (PROVENTIL) (2.5 MG/3ML)  0.083% nebulizer solution USE 1 VIAL IN NEBULIZER 4 TIMES DAILY     atorvastatin (LIPITOR) 40 MG tablet Take 1 tablet (40 mg total) by mouth at bedtime. 90 tablet 1   Calcium Carb-Cholecalciferol 500-125 MG-UNIT TABS Take 1,200 mg by mouth.     Cholecalciferol 25 MCG (1000 UT) capsule Take by mouth.     gabapentin (NEURONTIN) 300 MG capsule Take 300 mg by mouth at bedtime.     mupirocin ointment (BACTROBAN) 2 % ADD ONE INCH OF OINTMENT TO 8 OUNCES OF SINUS RINSE BOTTLE DAILY     PREDNISONE PO Take 15 mg by mouth daily.     TEGRETOL-XR 100 MG 12 hr tablet Take 500 mg by mouth daily. Takes 2 tabs at night  1   VENTOLIN HFA 108 (90 Base) MCG/ACT inhaler SMARTSIG:1 Puff(s) Via Inhaler Every 4 Hours PRN     amLODipine (NORVASC) 10 MG tablet Take 1 tablet (10 mg total) by mouth daily. 90 tablet 3   levothyroxine (SYNTHROID) 25 MCG tablet Take 25 mcg by mouth daily before breakfast. Take 1 and 1/2 tablets daily     montelukast (SINGULAIR) 10 MG tablet Take 1 tablet (10 mg total) by mouth at bedtime. 90 tablet 1   PHENobarbital (LUMINAL) 32.4 MG tablet Take one tablet by mouth at 6A and then 2 at 8P 180 tablet 1   arformoterol (BROVANA) 15 MCG/2ML NEBU Take 2 mLs (15 mcg total) by nebulization 2 (two) times daily. 120 mL 11   budesonide (PULMICORT) 0.25 MG/2ML nebulizer solution Take 2 mLs (0.25 mg total) by nebulization 2 (two) times daily. 60 mL 11   Camphor-Menthol-Methyl Sal (SALONPAS) 3.06-03-08 % PTCH Apply topically.     Cholecalciferol 25 MCG (1000 UT) capsule Take by mouth.     [facility] BUDESONIDE 32 MCG/ACT nasal spray ADD 1ML OF MEDICATION ADD TO 250ML OF SALINE IN SALINE IRRIGATION BOTTLE; IRRIGATE SINUSES WITH 120ML THROUGH EACH NOSTRIL ONCE DAILY     dextroamphetamine (DEXTROSTAT) 10 MG tablet TAKE 1 TABLET BY MOUTH IN THE MORNING, AT NOON, IN THE EVENING, AND AT BEDTIME 120 tablet 0   levothyroxine (SYNTHROID) 25 MCG tablet TAKE ONE (1) TABLET BY MOUTH EVERY DAY WITH BREAKFAST 90 tablet 3    methocarbamol (ROBAXIN) 500 MG tablet Take 1.5 tablets (750 mg total) by mouth every 8 (eight) hours as needed (muscle spasm/pain). 15 tablet 0   mometasone (NASONEX) 50 MCG/ACT nasal spray PLACE 2 SPRAYS INTO THE NOSE DAILY 17 g 6   NON  FORMULARY Salon pas pain patches/ PRN     polyethylene glycol powder (GLYCOLAX/MIRALAX) powder Take 3/4 capful dissolved in at least 8 ounces water/juice once daily. May titrate as needed 1530 g 3   predniSONE (DELTASONE) 10 MG tablet Take 10 mg by mouth every morning.     sodium chloride (OCEAN) 0.65 % nasal spray Place into the nose.     triamcinolone (KENALOG) 0.1 % Apply topically 2 (two) times daily.     Vitamin D, Ergocalciferol, (DRISDOL) 1.25 MG (50000 UT) CAPS capsule Take 1 capsule (50,000 Units total) by mouth every 7 (seven) days. 5 capsule 2   No facility-administered medications prior to visit.    Allergies  Allergen Reactions   Iodinated Contrast Media Anaphylaxis    Cardiac arrest   Iodine Other (See Comments)    Cardiac arrest As young adult, received iodinated contrast for IVP, went into cardiac arrest, was resuscitated and hospitalized. Has not received iodinated contrast since that episode. Reports no adverse reaction to betadine.    Morphine Other (See Comments)    Cardiac arrest Patient has previously tolerated hydrocodone, hydromorphone and fentanyl    Morphine And Related Other (See Comments)    Cardiac arrest   Metrizamide Other (See Comments)    Cardiac arrest Cardiac arrest    UPJ:[id number] except as noted in HPI Neurogenic bladder and gastroparesis-doing ok on miralax and stool softeners Has known renal cyst      Objective:     BP (!) 142/70 (BP Location: Left Arm, Patient Position: Sitting, Cuff Size: Normal)    Pulse 91    Temp 98.3 F (36.8 C) (Temporal)    Ht 5\' 2"  (1.575 m)    Wt 128 lb 6 oz (58.2 kg)    SpO2 98%    BMI 23.48 kg/m  Wt Readings from Last 3 Encounters:  06/28/21 128 lb 6 oz  (58.2 kg)  05/30/21 129 lb (58.5 kg)  02/17/21 124 lb 9.6 oz (56.5 kg)        Gen: WDWN NAD elderly WF HEENT: NCAT, conjunctiva not injected, sclera nonicteric NECK:  supple, no thyromegaly, no nodes, no carotid bruits CARDIAC: RRR, S1S2+, no murmur. DP 2+B LUNGS: CTAB. No wheezes ABDOMEN:  BS+, soft, NTND, No HSM, no masses EXT:  tr bipedal edema MSK: no gross abnormalities. walker NEURO: A&O x3.  CN II-XII intact.  PSYCH: normal mood. Good eye contact  Spent 65min reviewing records/labs from neuro visit  Assessment & Plan:   Problem List Items Addressed This Visit       Respiratory   COPD (chronic obstructive pulmonary disease) gold stage B   Relevant Medications   PREDNISONE PO   montelukast (SINGULAIR) 10 MG tablet     Endocrine   Hypothyroidism - Primary   Relevant Medications   levothyroxine (SYNTHROID) 25 MCG tablet     Nervous and Auditory   Chronic inflammatory demyelinating neuropathy (HCC)   Relevant Medications   PHENobarbital (LUMINAL) 32.4 MG tablet     Other   Seizures (HCC)   Relevant Medications   PHENobarbital (LUMINAL) 32.4 MG tablet   Other Visit Diagnoses     Essential hypertension       Relevant Medications   amLODipine (NORVASC) 10 MG tablet      HTN-controlled for age.  Cont meds Hypothyroidism-pt takes 1 tab/d.  Doing well.  Controlled. Cont meds Sz disorder-well controlled on current regimen-continue.   Chronic inflamm demyelinating neuropathy-managed by neuro.  Stable.  Falls freq.  Has walker.  Has done PT, etc.  COPD-out of control, but husband just noticed only taking 5mg  pred instead of 15mg -corrected this am.  Managed by pulm   cont meds  Meds ordered this encounter  Medications   amLODipine (NORVASC) 10 MG tablet    Sig: Take 1 tablet (10 mg total) by mouth daily.    Dispense:  90 tablet    Refill:  3   levothyroxine (SYNTHROID) 25 MCG tablet    Sig: Take 1 tablet (25 mcg total) by mouth daily before breakfast.     Dispense:  90 tablet    Refill:  3   PHENobarbital (LUMINAL) 32.4 MG tablet    Sig: Take one tablet by mouth at 6A and then 2 at 8P    Dispense:  270 tablet    Refill:  1   montelukast (SINGULAIR) 10 MG tablet    Sig: Take 1 tablet (10 mg total) by mouth at bedtime.    Dispense:  90 tablet    Refill:  3    Wellington Hampshire, MD

## 2021-07-12 ENCOUNTER — Other Ambulatory Visit: Payer: Self-pay | Admitting: Family Medicine

## 2021-07-12 ENCOUNTER — Ambulatory Visit
Admission: RE | Admit: 2021-07-12 | Discharge: 2021-07-12 | Disposition: A | Discharge status: Home / Self Care | Payer: Medicare Other | Source: Ambulatory Visit | Attending: Pulmonary Disease | Admitting: Pulmonary Disease

## 2021-07-12 DIAGNOSIS — Z1231 Encounter for screening mammogram for malignant neoplasm of breast: Secondary | ICD-10-CM | POA: Diagnosis not present

## 2021-07-22 DIAGNOSIS — J449 Chronic obstructive pulmonary disease, unspecified: Secondary | ICD-10-CM | POA: Diagnosis not present

## 2021-08-12 ENCOUNTER — Ambulatory Visit (INDEPENDENT_AMBULATORY_CARE_PROVIDER_SITE_OTHER): Payer: Medicare Other | Admitting: Family Medicine

## 2021-08-12 ENCOUNTER — Other Ambulatory Visit: Payer: Self-pay

## 2021-08-12 ENCOUNTER — Encounter: Payer: Self-pay | Admitting: Family Medicine

## 2021-08-12 VITALS — BP 122/70 | HR 88 | Temp 98.5°F | Ht 62.0 in | Wt 129.5 lb

## 2021-08-12 DIAGNOSIS — E7800 Pure hypercholesterolemia, unspecified: Secondary | ICD-10-CM | POA: Diagnosis not present

## 2021-08-12 DIAGNOSIS — E042 Nontoxic multinodular goiter: Secondary | ICD-10-CM | POA: Diagnosis not present

## 2021-08-12 DIAGNOSIS — R569 Unspecified convulsions: Secondary | ICD-10-CM | POA: Diagnosis not present

## 2021-08-12 DIAGNOSIS — L659 Nonscarring hair loss, unspecified: Secondary | ICD-10-CM | POA: Diagnosis not present

## 2021-08-12 DIAGNOSIS — E559 Vitamin D deficiency, unspecified: Secondary | ICD-10-CM

## 2021-08-12 NOTE — Progress Notes (Signed)
? ?Subjective:  ? ? ? Patient ID: [name], female    DOB: Oct 09, 1932, 88 y.o.   MRN: [id number] ? ?Chief Complaint  ?Patient presents with  ? Hairloss  ?  Hair falls out when combing, has a bald spot in the back ?Noticed a while back  ? Weight Gain  ? ? ?HPI-here w/husb ?Wt gain-per pt 4-5 pounds. ?Hair loss-when combing. For sev months.  Hair dresser mentioned it as well.  Husband sees it as well-poney tail thinner.  Has bald spot in back.   No surgery 6 mo ago.  No new meds.no new stressors. For 2 months on Biotin ? ?Sis had hair loss and thyroid ca.  Sons balding.  ? ?Pred dec from 15 to '10mg'$ .  Hard to get budesonide-brovana-got at  ? ?There are no preventive care reminders to display for this patient. ? ?Past Medical History:  ?Diagnosis Date  ? Allergic rhinitis   ? Anemia   ? Arthritis   ? Asthma -COPD   ? Blood transfusion 1957  ? Breast mass   ? right breast in milk duct, bx neg  ? Chronic fatigue syndrome   ? Chronic inflammatory demyelinating polyneuropathy ([place]) 03/2011  ? chronic sinusitis 08/08/2012  ? Colon polyp   ? Constipation   ? COPD (chronic obstructive pulmonary disease) ([place])   ? Cystocele 09/16/2012  ? GERD (gastroesophageal reflux disease)   ? Hyperlipidemia   ? Hypertension   ? Hypothyroid   ? Neurogenic bladder 2013  ? husband caths pt, manages with timed void  ? Osteoporosis   ? Pulmonary embolism ([place])   ? 1980s  ? Raynaud phenomenon   ? Renal cyst   ? Non-complex, contrast MRI 2013 with L>R non-complex cysts, dominant 3.7 left lower pole  ? Seizures ([place])   ? 1990 last seizures on meds Phenobarb  ? Shingles late 35's  ? Skin cancer   ? squamous cell  ? Thyroid nodule   ? Right thyroid lobe, only seen on Sagittal imaging measures 2.3 cm in craniocaudal dimension and appears stable  ? Tubular adenoma   ? Vitamin D deficiency   ? ? ?Past Surgical History:  ?Procedure Laterality Date  ? ABDOMINAL HYSTERECTOMY    ? ANTERIOR FUSION CLIVUS-C2 EXTRAORAL W/ ODONTOID EXCISION  12/2012  ?  APPENDECTOMY    ? ARTHROSCOPIC REPAIR ACL Left 1976  ? BASAL CELL CARCINOMA EXCISION    ? face  ? BLADDER SUSPENSION    ? BREAST DUCTAL SYSTEM EXCISION Right 01/15/2014  ? Procedure: EXCISION DUCTAL SYSTEM RIGHT BREAST;  Surgeon: Stark Klein, MD;  Location: [facility];  Service: General;  Laterality: Right;  ? BREAST EXCISIONAL BIOPSY Right   ? CARPAL TUNNEL RELEASE    ? right  ? CATARACT EXTRACTION    ? bilateral  ? cervical neck ablation    ? x 7, C3-C6/3 screws and plate  ? CESAREAN SECTION    ? CYSTOCELE REPAIR    ? DILATION AND CURETTAGE OF UTERUS    ? duptyren's contracture right hand    ? FINGER ARTHRODESIS Right 05/06/2015  ? Procedure: RIGHT RING PROXIMAL INTERPHALANGEAL FUSION (PIP);  Surgeon: Leanora Cover, MD;  Location: [facility];  Service: Orthopedics;  Laterality: Right;  ? FINGER GANGLION CYST EXCISION    ? right  ? HEMICOLECTOMY Right   ? JOINT REPLACEMENT    ? right and left basal joints of thumbs  ? left finger fusion    ? 3  fingers on left hand/one finger right  ? left ovary and tube removed    ? NASAL POLYP SURGERY    ? 4 sinus surgeries  ? NASAL SEPTUM SURGERY    ? PANNICULECTOMY    ? PROXIMAL INTERPHALANGEAL FUSION (PIP) Left 09/09/2013  ? Procedure: FUSION LEFT INDEX PROXIMAL INTERPHALANGEAL JOINT (PIP);  Surgeon: Cammie Sickle., MD;  Location: [facility];  Service: Orthopedics;  Laterality: Left;  ? right median nerve decompression    ? SHOULDER ARTHROSCOPY    ? x2 left, 1 right  ? sinus surgeries    ? x 4  ? squamous lesions removed    ? neck and face  ? STERIOD INJECTION Right 05/06/2015  ? Procedure: STEROID INJECTION;  Surgeon: Leanora Cover, MD;  Location: [facility];  Service: Orthopedics;  Laterality: Right;  Right Index Finger Proximal InterPhalangeal Joint Injection  ? TONSILLECTOMY AND ADENOIDECTOMY    ? TUBAL LIGATION    ? vocal polyps removed    ? ? ?Outpatient Medications Prior to Visit  ?Medication Sig Dispense  Refill  ? albuterol (PROVENTIL) (2.5 MG/3ML) 0.083% nebulizer solution USE 1 VIAL IN NEBULIZER 4 TIMES DAILY    ? amLODipine (NORVASC) 10 MG tablet Take 1 tablet (10 mg total) by mouth daily. 90 tablet 3  ? arformoterol (BROVANA) 15 MCG/2ML NEBU USE 1 VIAL IN NEBULIZER TWICE DAILY    ? atorvastatin (LIPITOR) 40 MG tablet Take 1 tablet (40 mg total) by mouth at bedtime. 90 tablet 1  ? budesonide (PULMICORT) 0.25 MG/2ML nebulizer solution Take by nebulization 2 (two) times daily.    ? Calcium Carb-Cholecalciferol 500-125 MG-UNIT TABS Take 1,200 mg by mouth.    ? Cholecalciferol 25 MCG (1000 UT) capsule Take by mouth.    ? gabapentin (NEURONTIN) 300 MG capsule Take 300 mg by mouth at bedtime.    ? levothyroxine (SYNTHROID) 25 MCG tablet Take 1 tablet (25 mcg total) by mouth daily before breakfast. 90 tablet 3  ? montelukast (SINGULAIR) 10 MG tablet Take 1 tablet (10 mg total) by mouth at bedtime. 90 tablet 3  ? mupirocin ointment (BACTROBAN) 2 % ADD ONE INCH OF OINTMENT TO 8 OUNCES OF SINUS RINSE BOTTLE DAILY    ? PHENobarbital (LUMINAL) 32.4 MG tablet Take one tablet by mouth at 6A and then 2 at 8P 270 tablet 1  ? PREDNISONE PO Take 10 mg by mouth daily.    ? TEGRETOL-XR 100 MG 12 hr tablet Take 500 mg by mouth daily. Takes 2 tabs at night  1  ? VENTOLIN HFA 108 (90 Base) MCG/ACT inhaler SMARTSIG:1 Puff(s) Via Inhaler Every 4 Hours PRN    ? ?No facility-administered medications prior to visit.  ? ? ?Allergies  ?Allergen Reactions  ? Iodinated Contrast Media Anaphylaxis  ?  Cardiac arrest  ? Iodine Other (See Comments)  ?  Cardiac arrest ?As young adult, received iodinated contrast for IVP, went into cardiac arrest, was resuscitated and hospitalized. Has not received iodinated contrast since that episode. Reports no adverse reaction to betadine.   ? Morphine Other (See Comments)  ?  Cardiac arrest ?Patient has previously tolerated hydrocodone, hydromorphone and fentanyl ?  ? Morphine And Related Other (See Comments)  ?   Cardiac arrest  ? Metrizamide Other (See Comments)  ?  Cardiac arrest ?Cardiac arrest ?  ? ?ROS neg/noncontributory except as noted HPI/below ? ? ?   ?Objective:  ?  ? ?BP 122/70   Pulse 88  Temp 98.5 ?F (36.9 ?C) (Temporal)   Ht '5\' 2"'$  (1.575 m)   Wt 129 lb 8 oz (58.7 kg)   SpO2 98%   BMI 23.69 kg/m?  ?Wt Readings from Last 3 Encounters:  ?08/12/21 129 lb 8 oz (58.7 kg)  ?06/28/21 128 lb 6 oz (58.2 kg)  ?05/30/21 129 lb (58.5 kg)  ? ? ?Physical Exam  ? ?Gen: WDWN NAD WF ?HEENT: NCAT, conjunctiva not injected, sclera nonicteric ?NECK:  supple, no thyromegaly, no nodes, no carotid bruits ?CARDIAC: RRR, S1S2+, no murmur. DP 2+B ?ABDOMEN:  BS+, soft, NTND, No HSM, no masses ?EXT:  no edema ?MSK: no gross abnormalities. Walker ?NEURO: A&O x3.  CN II-XII intact.  ?PSYCH: normal mood. Good eye contact ?Hair-thinning on top.  Hair pull test-1-2 hairs in some areas ? ?   ?Assessment & Plan:  ? ?Problem List Items Addressed This Visit   ? ?  ? Endocrine  ? Multiple thyroid nodules  ? Relevant Orders  ? TSH  ? T3, free  ? T4, free  ?  ? Other  ? Hyperlipidemia  ? Relevant Orders  ? Lipid panel  ? Seizures ([place])  ? Relevant Orders  ? CBC with Differential/Platelet  ? Comprehensive metabolic panel  ? Vitamin D deficiency  ? Relevant Orders  ? VITAMIN D 25 Hydroxy (Vit-D Deficiency, Fractures)  ? ?Other Visit Diagnoses   ? ? Alopecia    -  Primary  ? Relevant Orders  ? CBC with Differential/Platelet  ? Comprehensive metabolic panel  ? Vitamin B12  ? IBC + Ferritin  ? ?  ? Alopecia-could be meds, stress from fall in Jan, thyroid, other.  Stop biotin and check labs 1 wk-TSH,Ft4,FT3, B12, iron, cbc,cmp ?HLD-on meds.  Check labs ?MNG-on synthroid-check XLK,GM0,NU2 ?Sz disorder-controlled on meds.  Check CBC/CMP ?Low D-check D-also on sz meds so can lower it as well. ? ?No orders of the defined types were placed in this encounter. ? ? ?Wellington Hampshire, MD ? ?

## 2021-08-12 NOTE — Patient Instructions (Signed)
It was very nice to see you today! ? ?[place] in 1 wk for labs fasting.  [place] ? ? ?PLEASE NOTE: ? ?If you had any lab tests please let us know if you have not heard back within a few days. You may see your results on MyChart before we have a chance to review them but we will give you a call once they are reviewed by [place]. If we ordered any referrals today, please let us know if you have not heard from their office within the next week.  ? ?Please try these tips to maintain a healthy lifestyle: ? ?Eat most of your calories during the day when you are active. Eliminate processed foods including packaged sweets (pies, cakes, cookies), reduce intake of potatoes, white bread, white pasta, and white rice. Look for whole grain options, oat flour or almond flour. ? ?Each meal should contain half fruits/vegetables, one quarter protein, and one quarter carbs (no bigger than a computer mouse). ? ?Cut down on sweet beverages. This includes juice, soda, and sweet tea. Also watch fruit intake, though this is a healthier sweet option, it still contains natural sugar! Limit to 3 servings daily. ? ?Drink at least 1 glass of water with each meal and aim for at least 8 glasses per day ? ?Exercise at least 150 minutes every week.   ?

## 2021-08-19 ENCOUNTER — Other Ambulatory Visit (INDEPENDENT_AMBULATORY_CARE_PROVIDER_SITE_OTHER): Payer: Medicare Other

## 2021-08-19 DIAGNOSIS — E559 Vitamin D deficiency, unspecified: Secondary | ICD-10-CM

## 2021-08-19 DIAGNOSIS — L659 Nonscarring hair loss, unspecified: Secondary | ICD-10-CM | POA: Diagnosis not present

## 2021-08-19 DIAGNOSIS — E7800 Pure hypercholesterolemia, unspecified: Secondary | ICD-10-CM

## 2021-08-19 DIAGNOSIS — E042 Nontoxic multinodular goiter: Secondary | ICD-10-CM | POA: Diagnosis not present

## 2021-08-19 DIAGNOSIS — R569 Unspecified convulsions: Secondary | ICD-10-CM | POA: Diagnosis not present

## 2021-08-19 LAB — LIPID PANEL
Cholesterol: 190 mg/dL (ref 0–200)
HDL: 68.4 mg/dL (ref >39.00)
LDL Cholesterol: 106 mg/dL — ABNORMAL HIGH (ref 0–99)
NonHDL: 121.87
Total CHOL/HDL Ratio: 3
Triglycerides: 77 mg/dL (ref 0.0–149.0)
VLDL: 15.4 mg/dL (ref 0.0–40.0)

## 2021-08-19 LAB — COMPREHENSIVE METABOLIC PANEL WITH GFR
ALT: 25 U/L (ref 0–35)
AST: 31 U/L (ref 0–37)
Albumin: 4 g/dL (ref 3.5–5.2)
Alkaline Phosphatase: 103 U/L (ref 39–117)
BUN: 25 mg/dL — ABNORMAL HIGH (ref 6–23)
CO2: 29 meq/L (ref 19–32)
Calcium: 9 mg/dL (ref 8.4–10.5)
Chloride: 111 meq/L (ref 96–112)
Creatinine, Ser: 0.89 mg/dL (ref 0.40–1.20)
GFR: 58.48 mL/min — ABNORMAL LOW
Glucose, Bld: 78 mg/dL (ref 70–99)
Potassium: 4 meq/L (ref 3.5–5.1)
Sodium: 144 meq/L (ref 135–145)
Total Bilirubin: 0.3 mg/dL (ref 0.2–1.2)
Total Protein: 6.5 g/dL (ref 6.0–8.3)

## 2021-08-19 LAB — CBC WITH DIFFERENTIAL/PLATELET
Basophils Absolute: 0 10*3/uL (ref 0.0–0.1)
Basophils Relative: 1 % (ref 0.0–3.0)
Eosinophils Absolute: 0.1 10*3/uL (ref 0.0–0.7)
Eosinophils Relative: 3.1 % (ref 0.0–5.0)
HCT: 41.1 % (ref 36.0–46.0)
Hemoglobin: 13.8 g/dL (ref 12.0–15.0)
Lymphocytes Relative: 50.8 % — ABNORMAL HIGH (ref 12.0–46.0)
Lymphs Abs: 2.4 10*3/uL (ref 0.7–4.0)
MCHC: 33.6 g/dL (ref 30.0–36.0)
MCV: 91.2 fl (ref 78.0–100.0)
Monocytes Absolute: 0.5 10*3/uL (ref 0.1–1.0)
Monocytes Relative: 10.8 % (ref 3.0–12.0)
Neutro Abs: 1.6 10*3/uL (ref 1.4–7.7)
Neutrophils Relative %: 34.3 % — ABNORMAL LOW (ref 43.0–77.0)
Platelets: 212 10*3/uL (ref 150.0–400.0)
RBC: 4.51 Mil/uL (ref 3.87–5.11)
RDW: 14 % (ref 11.5–15.5)
WBC: 4.7 10*3/uL (ref 4.0–10.5)

## 2021-08-22 ENCOUNTER — Telehealth: Payer: Self-pay | Admitting: Family Medicine

## 2021-08-22 NOTE — Telephone Encounter (Signed)
Patient called wanting lab results - patient went to [facility] lab on 08/19/21- We have not received labs yet.  Patient stated lab office ONLY took half a bottle of blood - Is worries we will not have full results. - No action needed unless Dr Cherlynn Kaiser wants her to come to office for labs again-  ?

## 2021-08-22 NOTE — Telephone Encounter (Signed)
FYI

## 2021-08-23 ENCOUNTER — Other Ambulatory Visit: Payer: Self-pay | Admitting: Family Medicine

## 2021-08-23 ENCOUNTER — Encounter: Payer: Self-pay | Admitting: *Deleted

## 2021-08-23 ENCOUNTER — Other Ambulatory Visit: Payer: Self-pay | Admitting: *Deleted

## 2021-08-23 LAB — IBC + FERRITIN
Ferritin: 34.9 ng/mL (ref 10.0–291.0)
Iron: 65 ug/dL (ref 42–145)
Saturation Ratios: 21.8 % (ref 20.0–50.0)
TIBC: 298.2 ug/dL (ref 250.0–450.0)
Transferrin: 213 mg/dL (ref 212.0–360.0)

## 2021-08-23 LAB — VITAMIN D 25 HYDROXY (VIT D DEFICIENCY, FRACTURES): VITD: 63.75 ng/mL (ref 30.00–100.00)

## 2021-08-23 LAB — VITAMIN B12: Vitamin B-12: 222 pg/mL (ref 211–911)

## 2021-08-23 LAB — T4, FREE: Free T4: 0.54 ng/dL — ABNORMAL LOW (ref 0.60–1.60)

## 2021-08-23 LAB — TSH: TSH: 3.1 u[IU]/mL (ref 0.35–5.50)

## 2021-08-23 LAB — T3, FREE: T3, Free: 2.8 pg/mL (ref 2.3–4.2)

## 2021-08-23 MED ORDER — LEVOTHYROXINE SODIUM 50 MCG PO TABS: 50.0000 ug | ORAL_TABLET | Freq: Every day | ORAL | 3 refills | Status: DC

## 2021-08-23 NOTE — Telephone Encounter (Signed)
Patient and husband notified and verbalized understanding. Patient worried that they only took one vial of blood and should have taking more if there were more labs. Patient stated that if she needed to go give more blood, let her know and she would.  ?

## 2021-09-02 ENCOUNTER — Other Ambulatory Visit: Payer: Self-pay | Admitting: Family Medicine

## 2021-09-07 DIAGNOSIS — Z20822 Contact with and (suspected) exposure to covid-19: Secondary | ICD-10-CM | POA: Diagnosis not present

## 2021-09-07 DIAGNOSIS — R059 Cough, unspecified: Secondary | ICD-10-CM | POA: Diagnosis not present

## 2021-09-07 DIAGNOSIS — R051 Acute cough: Secondary | ICD-10-CM | POA: Diagnosis not present

## 2021-09-12 DIAGNOSIS — Z20822 Contact with and (suspected) exposure to covid-19: Secondary | ICD-10-CM | POA: Diagnosis not present

## 2021-09-13 DIAGNOSIS — J322 Chronic ethmoidal sinusitis: Secondary | ICD-10-CM | POA: Diagnosis not present

## 2021-09-13 DIAGNOSIS — Z9109 Other allergy status, other than to drugs and biological substances: Secondary | ICD-10-CM | POA: Diagnosis not present

## 2021-09-13 DIAGNOSIS — J3489 Other specified disorders of nose and nasal sinuses: Secondary | ICD-10-CM | POA: Diagnosis not present

## 2021-09-21 ENCOUNTER — Telehealth: Payer: Self-pay | Admitting: Family Medicine

## 2021-09-21 NOTE — Telephone Encounter (Signed)
Copied from [place] [id number]. Topic: Medicare AWV ?>> Sep 21, 2021  2:41 PM Harris-Coley, Hannah Beat wrote: ?Reason for CRM: Left message for patient to schedule Annual Wellness Visit.  Please schedule with Nurse Health Advisor Charlott Rakes, RN at [facility].  Please call [phone] ask for Juliann Pulse ?

## 2021-09-22 ENCOUNTER — Ambulatory Visit (INDEPENDENT_AMBULATORY_CARE_PROVIDER_SITE_OTHER): Payer: Medicare Other

## 2021-09-22 DIAGNOSIS — Z0000 Encounter for general adult medical examination without abnormal findings: Secondary | ICD-10-CM

## 2021-09-22 NOTE — Progress Notes (Addendum)
Virtual Visit via Telephone Note ? ?I connected with  [name] on 09/22/21 at 10:45 AM EDT by telephone and verified that I am speaking with the correct person using two identifiers. ? ?Medicare Annual Wellness visit completed telephonically due to Covid-19 pandemic.  ? ?Persons participating in this call: This Health Coach and this patient.  ? ?Location: ?Patient: Home ?Provider: Office  ?  ?I discussed the limitations, risks, security and privacy concerns of performing an evaluation and management service by telephone and the availability of in person appointments. The patient expressed understanding and agreed to proceed. ? ?Unable to perform video visit due to video visit attempted and failed and/or patient does not have video capability.  ? ?Some vital signs may be absent or patient reported.  ? ?Julia Brace, LPN ? ? ?Subjective:  ? [name] is a 88 y.o. female who presents for Medicare Annual (Subsequent) preventive examination. ? ?Review of Systems    ? ?Cardiac Risk Factors include: advanced age (>20mn, >>22women);hypertension;dyslipidemia ? ?   ?Objective:  ?  ?There were no vitals filed for this visit. ?There is no height or weight on file to calculate BMI. ? ? ?  09/22/2021  ? 10:49 AM 05/30/2021  ?  8:11 AM 08/14/2019  ? 10:21 AM 10/14/2018  ?  6:31 PM 12/03/2017  ? 11:03 AM 12/03/2017  ? 11:00 AM 06/18/2015  ?  4:10 PM  ?Advanced Directives  ?Does Patient Have a Medical Advance Directive? Yes No;Yes Yes Yes Yes No Yes  ?Type of [place] Living will Living will [place] will  Out of facility DNR (pink MOST or yellow form);Living will;Healthcare Power of Attorney  ?Does patient want to make changes to medical advance directive?  No - Guardian declined No - Patient declined    No - Patient declined  ?Copy of [place]? Yes - validated most recent copy  scanned in chart (See row information) No - copy requested   No - copy requested  No - copy requested  ?Would patient like information on creating a medical advance directive?  No - Patient declined    No - Patient declined   ? ? ?Current Medications (verified) ?Outpatient Encounter Medications as of 09/22/2021  ?Medication Sig  ? albuterol (PROVENTIL) (2.5 MG/3ML) 0.083% nebulizer solution USE 1 VIAL IN NEBULIZER 4 TIMES DAILY  ? amLODipine (NORVASC) 10 MG tablet Take 1 tablet (10 mg total) by mouth daily.  ? arformoterol (BROVANA) 15 MCG/2ML NEBU USE 1 VIAL IN NEBULIZER TWICE DAILY  ? atorvastatin (LIPITOR) 40 MG tablet TAKE 1 TABLET BY MOUTH EVERY NIGHT AT BEDTIME  ? budesonide (PULMICORT) 0.25 MG/2ML nebulizer solution Take by nebulization 2 (two) times daily.  ? Calcium Carb-Cholecalciferol 500-125 MG-UNIT TABS Take 1,200 mg by mouth.  ? Cholecalciferol 25 MCG (1000 UT) capsule Take by mouth.  ? gabapentin (NEURONTIN) 300 MG capsule Take 300 mg by mouth at bedtime.  ? levofloxacin (LEVAQUIN) 500 MG tablet Take by mouth.  ? levothyroxine (SYNTHROID) 50 MCG tablet Take 1 tablet (50 mcg total) by mouth daily before breakfast.  ? montelukast (SINGULAIR) 10 MG tablet Take 1 tablet (10 mg total) by mouth at bedtime.  ? mupirocin ointment (BACTROBAN) 2 % ADD ONE INCH OF OINTMENT TO 8 OUNCES OF SINUS RINSE BOTTLE DAILY  ? PHENobarbital (LUMINAL) 32.4 MG tablet Take one tablet by mouth at 6A and then 2  at [place] Take 10 mg by mouth daily.  ? TEGRETOL-XR 100 MG 12 hr tablet Take 500 mg by mouth daily. Takes 2 tabs at night  ? VENTOLIN HFA 108 (90 Base) MCG/ACT inhaler SMARTSIG:1 Puff(s) Via Inhaler Every 4 Hours PRN  ? ?No facility-administered encounter medications on file as of 09/22/2021.  ? ? ?Allergies (verified) ?Iodinated contrast media, Iodine, Morphine, Morphine and related, and Metrizamide  ? ?History: ?Past Medical History:  ?Diagnosis Date  ? Allergic rhinitis   ? Anemia   ? Arthritis   ? Asthma  -COPD   ? Blood transfusion 1957  ? Breast mass   ? right breast in milk duct, bx neg  ? Chronic fatigue syndrome   ? 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 chronic sinusitis 08/08/2012  ? Colon polyp   ? Constipation   ? COPD (chronic obstructive pulmonary disease) ([place])   ? Cystocele 09/16/2012  ? GERD (gastroesophageal reflux disease)   ? Hyperlipidemia   ? Hypertension   ? Hypothyroid   ? Neurogenic bladder 2013  ? husband caths pt, manages with timed void  ? Osteoporosis   ? Pulmonary embolism ([place])   ? 1980s  ? Raynaud phenomenon   ? Renal cyst   ? Non-complex, contrast MRI 2013 with L>R non-complex cysts, dominant 3.7 left lower pole  ? Seizures ([place])   ? 1990 last seizures on meds Phenobarb  ? Shingles late 74's  ? Skin cancer   ? squamous cell  ? Thyroid nodule   ? Right thyroid lobe, only seen on Sagittal imaging measures 2.3 cm in craniocaudal dimension and appears stable  ? Tubular adenoma   ? Vitamin D deficiency   ? ?Past Surgical History:  ?Procedure Laterality Date  ? ABDOMINAL HYSTERECTOMY    ? ANTERIOR FUSION CLIVUS-C2 EXTRAORAL W/ ODONTOID EXCISION  12/2012  ? APPENDECTOMY    ? ARTHROSCOPIC REPAIR ACL Left 1976  ? BASAL CELL CARCINOMA EXCISION    ? face  ? BLADDER SUSPENSION    ? BREAST DUCTAL SYSTEM EXCISION Right 01/15/2014  ? Procedure: EXCISION DUCTAL SYSTEM RIGHT BREAST;  Surgeon: Stark Klein, MD;  Location: [facility];  Service: General;  Laterality: Right;  ? BREAST EXCISIONAL BIOPSY Right   ? CARPAL TUNNEL RELEASE    ? right  ? CATARACT EXTRACTION    ? bilateral  ? cervical neck ablation    ? x 7, C3-C6/3 screws and plate  ? CESAREAN SECTION    ? CYSTOCELE REPAIR    ? DILATION AND CURETTAGE OF UTERUS    ? duptyren's contracture right hand    ? FINGER ARTHRODESIS Right 05/06/2015  ? Procedure: RIGHT RING PROXIMAL INTERPHALANGEAL FUSION (PIP);  Surgeon: Julia Cover, MD;  Location: [facility];  Service: Orthopedics;  Laterality: Right;   ? FINGER GANGLION CYST EXCISION    ? right  ? HEMICOLECTOMY Right   ? JOINT REPLACEMENT    ? right and left basal joints of thumbs  ? left finger fusion    ? 3 fingers on left hand/one finger right  ? left ovary and tube removed    ? NASAL POLYP SURGERY    ? 4 sinus surgeries  ? NASAL SEPTUM SURGERY    ? PANNICULECTOMY    ? PROXIMAL INTERPHALANGEAL FUSION (PIP) Left 09/09/2013  ? Procedure: FUSION LEFT INDEX PROXIMAL INTERPHALANGEAL JOINT (PIP);  Surgeon: Cammie Sickle., MD;  Location[facility];  Service: Orthopedics;  Laterality: Left;  ? right median nerve decompression    ? SHOULDER ARTHROSCOPY    ? x2 left, 1 right  ? sinus surgeries    ? x 4  ? squamous lesions removed    ? neck and face  ? STERIOD INJECTION Right 05/06/2015  ? Procedure: STEROID INJECTION;  Surgeon: Julia Cover, MD;  Location: [facility];  Service: Orthopedics;  Laterality: Right;  Right Index Finger Proximal InterPhalangeal Joint Injection  ? TONSILLECTOMY AND ADENOIDECTOMY    ? TUBAL LIGATION    ? vocal polyps removed    ? ?Family History  ?Problem Relation Age of Onset  ? Heart disease Mother   ? Hyperlipidemia Mother   ?     M, aunt  ? Ovarian cancer Sister   ? Lung cancer Sister   ?     lung - stage 1  ? Thyroid cancer Sister   ? Colon cancer Father   ?     29  ? Malignant hyperthermia Cousin   ? Stomach cancer Other   ?     first cousin  ? Skin cancer Daughter   ? Breast cancer Neg Hx   ? Esophageal cancer Neg Hx   ? Pancreatic cancer Neg Hx   ? ?Social History  ? ?Socioeconomic History  ? Marital status: Married  ?  Spouse name: Not on file  ? Number of children: 3  ? Years of education: Not on file  ? Highest education level: Not on file  ?Occupational History  ? Occupation: retired, Licensed conveyancer  ?  Employer: RETIRED  ?Tobacco Use  ? Smoking status: Former  ?  Packs/day: 0.50  ?  Years: 3.00  ?  Pack years: 1.50  ?  Types: Cigarettes  ?  Quit date: 05/29/1978  ?  Years since quitting: 43.3  ?  Smokeless tobacco: Never  ? Tobacco comments:  ?  quit smoking 35 years ago  ?Vaping Use  ? Vaping Use: Never used  ?Substance and Sexual Activity  ? Alcohol use: No  ? Drug use: No  ? Sexual activity: Yes

## 2021-09-22 NOTE — Patient Instructions (Signed)
Ms. Nokes , ?Thank you for taking time to come for your Medicare Wellness Visit. I appreciate your ongoing commitment to your health goals. Please review the following plan we discussed and let me know if I can assist you in the future.  ? ?Screening recommendations/referrals: ?Colonoscopy: No longer required  ?Mammogram: Done 07/12/21 repeat every year  ?Bone Density: Done 03/04/20 repeat every 2 years  ?Recommended yearly ophthalmology/optometry visit for glaucoma screening and checkup ?Recommended yearly dental visit for hygiene and checkup ? ?Vaccinations: ?Influenza vaccine: Up to date per pt  ?Pneumococcal vaccine: Up to date ?Tdap vaccine: Done 10/14/18 repeat every 10 years  ?Shingles vaccine: Completed 05/01/19 & 07/30/19   ?Covid-19:Completed 1/19, 2/9, 03/02/20 & 09/07/20 ? ?Advanced directives: Copies in chart  ? ?Conditions/risks identified: None at this time  ? ?Next appointment: Follow up in one year for your annual wellness visit  ? ? ?Preventive Care 45 Years and Older, Female ?Preventive care refers to lifestyle choices and visits with your health care provider that can promote health and wellness. ?What does preventive care include? ?A yearly physical exam. This is also called an annual well check. ?Dental exams once or twice a year. ?Routine eye exams. Ask your health care provider how often you should have your eyes checked. ?Personal lifestyle choices, including: ?Daily care of your teeth and gums. ?Regular physical activity. ?Eating a healthy diet. ?Avoiding tobacco and drug use. ?Limiting alcohol use. ?Practicing safe sex. ?Taking low-dose aspirin every day. ?Taking vitamin and mineral supplements as recommended by your health care provider. ?What happens during an annual well check? ?The services and screenings done by your health care provider during your annual well check will depend on your age, overall health, lifestyle risk factors, and family history of disease. ?Counseling  ?Your health care  provider may ask you questions about your: ?Alcohol use. ?Tobacco use. ?Drug use. ?Emotional well-being. ?Home and relationship well-being. ?Sexual activity. ?Eating habits. ?History of falls. ?Memory and ability to understand (cognition). ?Work and work Statistician. ?Reproductive health. ?Screening  ?You may have the following tests or measurements: ?Height, weight, and BMI. ?Blood pressure. ?Lipid and cholesterol levels. These may be checked every 5 years, or more frequently if you are over 4 years old. ?Skin check. ?Lung cancer screening. You may have this screening every year starting at age 60 if you have a 30-pack-year history of smoking and currently smoke or have quit within the past 15 years. ?Fecal occult blood test (FOBT) of the stool. You may have this test every year starting at age 55. ?Flexible sigmoidoscopy or colonoscopy. You may have a sigmoidoscopy every 5 years or a colonoscopy every 10 years starting at age 91. ?Hepatitis C blood test. ?Hepatitis B blood test. ?Sexually transmitted disease (STD) testing. ?Diabetes screening. This is done by checking your blood sugar (glucose) after you have not eaten for a while (fasting). You may have this done every 1-3 years. ?Bone density scan. This is done to screen for osteoporosis. You may have this done starting at age 52. ?Mammogram. This may be done every 1-2 years. Talk to your health care provider about how often you should have regular mammograms. ?Talk with your health care provider about your test results, treatment options, and if necessary, the need for more tests. ?Vaccines  ?Your health care provider may recommend certain vaccines, such as: ?Influenza vaccine. This is recommended every year. ?Tetanus, diphtheria, and acellular pertussis (Tdap, Td) vaccine. You may need a Td booster every 10 years. ?Zoster vaccine.  You may need this after age 32. ?Pneumococcal 13-valent conjugate (PCV13) vaccine. One dose is recommended after age  64. ?Pneumococcal polysaccharide (PPSV23) vaccine. One dose is recommended after age 72. ?Talk to your health care provider about which screenings and vaccines you need and how often you need them. ?This information is not intended to replace advice given to you by your health care provider. Make sure you discuss any questions you have with your health care provider. ?Document Released: 06/11/2015 Document Revised: 02/02/2016 Document Reviewed: 03/16/2015 ?Elsevier Interactive Patient Education ? 2017 [place]. ? ?Fall Prevention in the Home ?Falls can cause injuries. They can happen to people of all ages. There are many things you can do to make your home safe and to help prevent falls. ?What can I do on the outside of my home? ?Regularly fix the edges of walkways and driveways and fix any cracks. ?Remove anything that might make you trip as you walk through a door, such as a raised step or threshold. ?Trim any bushes or trees on the path to your home. ?Use bright outdoor lighting. ?Clear any walking paths of anything that might make someone trip, such as rocks or tools. ?Regularly check to see if handrails are loose or broken. Make sure that both sides of any steps have handrails. ?Any raised decks and porches should have guardrails on the edges. ?Have any leaves, snow, or ice cleared regularly. ?Use sand or salt on walking paths during winter. ?Clean up any spills in your garage right away. This includes oil or grease spills. ?What can I do in the bathroom? ?Use night lights. ?Install grab bars by the toilet and in the tub and shower. Do not use towel bars as grab bars. ?Use non-skid mats or decals in the tub or shower. ?If you need to sit down in the shower, use a plastic, non-slip stool. ?Keep the floor dry. Clean up any water that spills on the floor as soon as it happens. ?Remove soap buildup in the tub or shower regularly. ?Attach bath mats securely with double-sided non-slip rug tape. ?Do not have throw  rugs and other things on the floor that can make you trip. ?What can I do in the bedroom? ?Use night lights. ?Make sure that you have a light by your bed that is easy to reach. ?Do not use any sheets or blankets that are too big for your bed. They should not hang down onto the floor. ?Have a firm chair that has side arms. You can use this for support while you get dressed. ?Do not have throw rugs and other things on the floor that can make you trip. ?What can I do in the kitchen? ?Clean up any spills right away. ?Avoid walking on wet floors. ?Keep items that you use a lot in easy-to-reach places. ?If you need to reach something above you, use a strong step stool that has a grab bar. ?Keep electrical cords out of the way. ?Do not use floor polish or wax that makes floors slippery. If you must use wax, use non-skid floor wax. ?Do not have throw rugs and other things on the floor that can make you trip. ?What can I do with my stairs? ?Do not leave any items on the stairs. ?Make sure that there are handrails on both sides of the stairs and use them. Fix handrails that are broken or loose. Make sure that handrails are as long as the stairways. ?Check any carpeting to make sure that it is  firmly attached to the stairs. Fix any carpet that is loose or worn. ?Avoid having throw rugs at the top or bottom of the stairs. If you do have throw rugs, attach them to the floor with carpet tape. ?Make sure that you have a light switch at the top of the stairs and the bottom of the stairs. If you do not have them, ask someone to add them for you. ?What else can I do to help prevent falls? ?Wear shoes that: ?Do not have high heels. ?Have rubber bottoms. ?Are comfortable and fit you well. ?Are closed at the toe. Do not wear sandals. ?If you use a stepladder: ?Make sure that it is fully opened. Do not climb a closed stepladder. ?Make sure that both sides of the stepladder are locked into place. ?Ask someone to hold it for you, if  possible. ?Clearly mark and make sure that you can see: ?Any grab bars or handrails. ?First and last steps. ?Where the edge of each step is. ?Use tools that help you move around (mobility aids) if they are needed. The

## 2021-10-03 DIAGNOSIS — Z20822 Contact with and (suspected) exposure to covid-19: Secondary | ICD-10-CM | POA: Diagnosis not present

## 2021-10-04 DIAGNOSIS — J301 Allergic rhinitis due to pollen: Secondary | ICD-10-CM | POA: Diagnosis not present

## 2021-10-13 DIAGNOSIS — J301 Allergic rhinitis due to pollen: Secondary | ICD-10-CM | POA: Diagnosis not present

## 2021-10-20 DIAGNOSIS — J301 Allergic rhinitis due to pollen: Secondary | ICD-10-CM | POA: Diagnosis not present

## 2021-10-26 ENCOUNTER — Other Ambulatory Visit: Payer: Self-pay | Admitting: Family Medicine

## 2021-10-27 DIAGNOSIS — J301 Allergic rhinitis due to pollen: Secondary | ICD-10-CM | POA: Diagnosis not present

## 2021-11-07 DIAGNOSIS — J301 Allergic rhinitis due to pollen: Secondary | ICD-10-CM | POA: Diagnosis not present

## 2021-11-08 DIAGNOSIS — R269 Unspecified abnormalities of gait and mobility: Secondary | ICD-10-CM | POA: Diagnosis not present

## 2021-11-08 DIAGNOSIS — R252 Cramp and spasm: Secondary | ICD-10-CM | POA: Diagnosis not present

## 2021-11-08 DIAGNOSIS — R200 Anesthesia of skin: Secondary | ICD-10-CM | POA: Diagnosis not present

## 2021-11-08 DIAGNOSIS — G629 Polyneuropathy, unspecified: Secondary | ICD-10-CM | POA: Diagnosis not present

## 2021-11-09 DIAGNOSIS — G6181 Chronic inflammatory demyelinating polyneuritis: Secondary | ICD-10-CM | POA: Diagnosis not present

## 2021-11-09 DIAGNOSIS — J449 Chronic obstructive pulmonary disease, unspecified: Secondary | ICD-10-CM | POA: Diagnosis not present

## 2021-11-14 DIAGNOSIS — J301 Allergic rhinitis due to pollen: Secondary | ICD-10-CM | POA: Diagnosis not present

## 2021-11-21 DIAGNOSIS — J301 Allergic rhinitis due to pollen: Secondary | ICD-10-CM | POA: Diagnosis not present

## 2021-11-22 DIAGNOSIS — M5412 Radiculopathy, cervical region: Secondary | ICD-10-CM | POA: Diagnosis not present

## 2021-11-22 DIAGNOSIS — G629 Polyneuropathy, unspecified: Secondary | ICD-10-CM | POA: Diagnosis not present

## 2021-11-22 DIAGNOSIS — G5603 Carpal tunnel syndrome, bilateral upper limbs: Secondary | ICD-10-CM | POA: Diagnosis not present

## 2021-11-28 DIAGNOSIS — J301 Allergic rhinitis due to pollen: Secondary | ICD-10-CM | POA: Diagnosis not present

## 2021-12-07 DIAGNOSIS — J301 Allergic rhinitis due to pollen: Secondary | ICD-10-CM | POA: Diagnosis not present

## 2021-12-08 DIAGNOSIS — G5602 Carpal tunnel syndrome, left upper limb: Secondary | ICD-10-CM | POA: Diagnosis not present

## 2021-12-08 DIAGNOSIS — G5601 Carpal tunnel syndrome, right upper limb: Secondary | ICD-10-CM | POA: Diagnosis not present

## 2021-12-15 ENCOUNTER — Other Ambulatory Visit: Payer: Self-pay | Admitting: Family Medicine

## 2021-12-15 DIAGNOSIS — R569 Unspecified convulsions: Secondary | ICD-10-CM

## 2021-12-22 DIAGNOSIS — J301 Allergic rhinitis due to pollen: Secondary | ICD-10-CM | POA: Diagnosis not present

## 2021-12-27 ENCOUNTER — Ambulatory Visit (INDEPENDENT_AMBULATORY_CARE_PROVIDER_SITE_OTHER): Payer: Medicare Other | Admitting: Family Medicine

## 2021-12-27 ENCOUNTER — Encounter: Payer: Self-pay | Admitting: Family Medicine

## 2021-12-27 VITALS — BP 130/70 | HR 72 | Temp 97.7°F | Ht 62.0 in | Wt 127.2 lb

## 2021-12-27 DIAGNOSIS — E038 Other specified hypothyroidism: Secondary | ICD-10-CM | POA: Diagnosis not present

## 2021-12-27 DIAGNOSIS — S60415A Abrasion of left ring finger, initial encounter: Secondary | ICD-10-CM | POA: Diagnosis not present

## 2021-12-27 DIAGNOSIS — I10 Essential (primary) hypertension: Secondary | ICD-10-CM

## 2021-12-27 DIAGNOSIS — R569 Unspecified convulsions: Secondary | ICD-10-CM | POA: Diagnosis not present

## 2021-12-27 LAB — CBC WITH DIFFERENTIAL/PLATELET
Basophils Absolute: 0 10*3/uL (ref 0.0–0.1)
Basophils Relative: 0.8 % (ref 0.0–3.0)
Eosinophils Absolute: 0.1 10*3/uL (ref 0.0–0.7)
Eosinophils Relative: 2.5 % (ref 0.0–5.0)
HCT: 42.5 % (ref 36.0–46.0)
Hemoglobin: 14.5 g/dL (ref 12.0–15.0)
Lymphocytes Relative: 47.7 % — ABNORMAL HIGH (ref 12.0–46.0)
Lymphs Abs: 2.1 10*3/uL (ref 0.7–4.0)
MCHC: 34.2 g/dL (ref 30.0–36.0)
MCV: 90.7 fl (ref 78.0–100.0)
Monocytes Absolute: 0.5 10*3/uL (ref 0.1–1.0)
Monocytes Relative: 10.8 % (ref 3.0–12.0)
Neutro Abs: 1.7 10*3/uL (ref 1.4–7.7)
Neutrophils Relative %: 38.2 % — ABNORMAL LOW (ref 43.0–77.0)
Platelets: 209 10*3/uL (ref 150.0–400.0)
RBC: 4.69 Mil/uL (ref 3.87–5.11)
RDW: 13.1 % (ref 11.5–15.5)
WBC: 4.4 10*3/uL (ref 4.0–10.5)

## 2021-12-27 LAB — COMPREHENSIVE METABOLIC PANEL
ALT: 32 U/L (ref 0–35)
AST: 39 U/L — ABNORMAL HIGH (ref 0–37)
Albumin: 4.2 g/dL (ref 3.5–5.2)
Alkaline Phosphatase: 113 U/L (ref 39–117)
BUN: 37 mg/dL — ABNORMAL HIGH (ref 6–23)
CO2: 26 mEq/L (ref 19–32)
Calcium: 9.7 mg/dL (ref 8.4–10.5)
Chloride: 104 mEq/L (ref 96–112)
Creatinine, Ser: 0.89 mg/dL (ref 0.40–1.20)
GFR: 58.33 mL/min — ABNORMAL LOW (ref >60.00)
Glucose, Bld: 81 mg/dL (ref 70–99)
Potassium: 3.9 mEq/L (ref 3.5–5.1)
Sodium: 138 mEq/L (ref 135–145)
Total Bilirubin: 0.4 mg/dL (ref 0.2–1.2)
Total Protein: 7.4 g/dL (ref 6.0–8.3)

## 2021-12-27 LAB — T3, FREE: T3, Free: 2.5 pg/mL (ref 2.3–4.2)

## 2021-12-27 LAB — TSH: TSH: 2.87 u[IU]/mL (ref 0.35–5.50)

## 2021-12-27 LAB — T4, FREE: Free T4: 0.69 ng/dL (ref 0.60–1.60)

## 2021-12-27 NOTE — Progress Notes (Signed)
Subjective:     Patient ID: [name], female    DOB: 04-29-1933, 88 y.o.   MRN: [id number]  Chief Complaint  Patient presents with   Follow-up    6 month follow-up on HTN Fasting    [place] about 1 week ago, hurt left middle finger     HPI-here w/husb A.  HTN-Pt is on amlodipine .  Bp's running 130/70.  No ha/dizziness/cp/palp/edema/cough/sob.  Pulse was high in past-but ok.  2.  Hypothyroidism. MNG.  Doing well on meds 3.  Sz disorder-well controlled on meds.  No breakthru. Last sz 1991.  4.  Loses balance-long time.  Fell 1 wk ago.  Was bending over reaching in chest.  Hit face.  Hit L hand -middle finger bleeding and pain.  Pain in wrist as well.  Declined ER.  No LOC    There are no preventive care reminders to display for this patient.  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 ([place]) 03/2011   chronic sinusitis 08/08/2012   Colon polyp    Constipation    COPD (chronic obstructive pulmonary disease) ([place])    Cystocele 09/16/2012   GERD (gastroesophageal reflux disease)    Hyperlipidemia    Hypertension    Hypothyroid    Neurogenic bladder 2013   husband caths pt, manages with timed void   Osteoporosis    Pulmonary embolism ([place])    1980s   Raynaud phenomenon    Renal cyst    Non-complex, contrast MRI 2013 with L>R non-complex cysts, dominant 3.7 left lower pole   Seizures ([place])    1990 last seizures on meds Phenobarb   Shingles late 90's   Skin cancer    squamous cell   Thyroid nodule    Right thyroid lobe, only seen on Sagittal imaging measures 2.3 cm in craniocaudal dimension and appears stable   Tubular adenoma    Vitamin D deficiency     Past Surgical History:  Procedure Laterality Date   ABDOMINAL HYSTERECTOMY      ANTERIOR FUSION CLIVUS-C2 EXTRAORAL W/ ODONTOID EXCISION  12/2012   APPENDECTOMY     ARTHROSCOPIC REPAIR ACL Left 1976   BASAL CELL CARCINOMA EXCISION     face   BLADDER SUSPENSION     BREAST DUCTAL SYSTEM EXCISION Right 01/15/2014   Procedure: EXCISION DUCTAL SYSTEM RIGHT BREAST;  Surgeon: Stark Klein, MD;  Location: [facility];  Service: General;  Laterality: Right;   BREAST EXCISIONAL BIOPSY Right    CARPAL TUNNEL RELEASE     right   CATARACT EXTRACTION     bilateral   cervical neck ablation     x 7, C3-C6/3 screws and plate   CESAREAN SECTION     CYSTOCELE REPAIR     DILATION AND CURETTAGE OF UTERUS     duptyren's contracture right hand     FINGER ARTHRODESIS Right 05/06/2015   Procedure: RIGHT RING PROXIMAL INTERPHALANGEAL FUSION (PIP);  Surgeon: Leanora Cover, MD;  Location: [facility];  Service: Orthopedics;  Laterality: Right;   FINGER GANGLION CYST EXCISION     right   HEMICOLECTOMY Right    JOINT REPLACEMENT     right and  left basal joints of thumbs   left finger fusion     3 fingers on left hand/one finger right   left ovary and tube removed     NASAL POLYP SURGERY     4 sinus surgeries   NASAL SEPTUM SURGERY     PANNICULECTOMY     PROXIMAL INTERPHALANGEAL FUSION (PIP) Left 09/09/2013   Procedure: FUSION LEFT INDEX PROXIMAL INTERPHALANGEAL JOINT (PIP);  Surgeon: Cammie Sickle., MD;  Location: [facility];  Service: Orthopedics;  Laterality: Left;   right median nerve decompression     SHOULDER ARTHROSCOPY     x2 left, 1 right   sinus surgeries     x 4   squamous lesions removed     neck and face   STERIOD INJECTION Right 05/06/2015   Procedure: STEROID INJECTION;  Surgeon: Leanora Cover, MD;  Location: [place];  Service: Orthopedics;  Laterality: Right;  Right Index Finger Proximal InterPhalangeal Joint Injection   TONSILLECTOMY AND ADENOIDECTOMY     TUBAL LIGATION     vocal polyps removed       Outpatient Medications Prior to Visit  Medication Sig Dispense Refill   albuterol (PROVENTIL) (2.5 MG/3ML) 0.083% nebulizer solution USE 1 VIAL IN NEBULIZER 4 TIMES DAILY     amLODipine (NORVASC) 10 MG tablet Take 1 tablet (10 mg total) by mouth daily. 90 tablet 3   arformoterol (BROVANA) 15 MCG/2ML NEBU USE 1 VIAL IN NEBULIZER TWICE DAILY     atorvastatin (LIPITOR) 40 MG tablet TAKE 1 TABLET BY MOUTH EVERY NIGHT AT BEDTIME 90 tablet 1   budesonide (PULMICORT) 0.25 MG/2ML nebulizer solution Take by nebulization 2 (two) times daily.     Calcium Carb-Cholecalciferol 500-125 MG-UNIT TABS Take 1,200 mg by mouth.     Cholecalciferol 25 MCG (1000 UT) capsule Take by mouth.     gabapentin (NEURONTIN) 300 MG capsule Take 300 mg by mouth at bedtime.     levothyroxine (SYNTHROID) 50 MCG tablet TAKE 1 TABLET(50 MCG) BY MOUTH DAILY BEFORE AND BREAKFAST 90 tablet 3   montelukast (SINGULAIR) 10 MG tablet Take 1 tablet (10 mg total) by mouth at bedtime. 90 tablet 3   mupirocin ointment (BACTROBAN) 2 % ADD ONE INCH OF OINTMENT TO 8 OUNCES OF SINUS RINSE BOTTLE DAILY     PHENobarbital (LUMINAL) 32.4 MG tablet TAKE 1 TABLET BY MOUTH AT 6:00 AM AND 2 TABLETS AT 8:00 PM 270 tablet 1   PREDNISONE PO Take 10 mg by mouth daily.     TEGRETOL-XR 100 MG 12 hr tablet Take 500 mg by mouth daily. Takes 2 tabs at night  1   VENTOLIN HFA 108 (90 Base) MCG/ACT inhaler SMARTSIG:1 Puff(s) Via Inhaler Every 4 Hours PRN     No facility-administered medications prior to visit.    Allergies  Allergen Reactions   Iodinated Contrast Media Anaphylaxis    Cardiac arrest   Iodine Other (See Comments)    Cardiac arrest As young adult, received iodinated contrast for IVP, went into cardiac arrest, was resuscitated and hospitalized. Has not received iodinated contrast since that episode. Reports no adverse reaction to betadine.    Morphine Other (See Comments)    Cardiac arrest Patient has previously tolerated hydrocodone,  hydromorphone and fentanyl    Morphine And Related Other (See Comments)    Cardiac arrest   Cocklebur    Lambs Quarters    Metrizamide Other (See Comments)    Cardiac arrest Cardiac arrest  Mugwort    Sheep Sorrel    Spiny Pigweed (Amaranthus Spinosus) Skin Test    ROS neg/noncontributory except as noted HPI/below Tired all the time.   Allergic to everything.  Getting immunotherapy now.  CTS B-surg in past-got injections-not help     Objective:     BP 130/70   Pulse 72   Temp 97.7 F (36.5 C) (Temporal)   Ht '5\' 2"'$  (1.575 m)   Wt 127 lb 4 oz (57.7 kg)   SpO2 98%   BMI 23.27 kg/m  Wt Readings from Last 3 Encounters:  12/27/21 127 lb 4 oz (57.7 kg)  08/12/21 129 lb 8 oz (58.7 kg)  06/28/21 128 lb 6 oz (58.2 kg)    Physical Exam   Gen: WDWN NAD wf HEENT: NCAT, conjunctiva not injected, sclera nonicteric NECK:  supple, no thyromegaly, no nodes, no carotid bruits CARDIAC: RRR, S1S2+, no murmur. DP 2+B LUNGS: CTAB. No wheezes ABDOMEN:  BS+, soft, NTND, No HSM, no masses EXT:  no edema MSK: no gross abnormalities. walker NEURO: A&O x3.  CN II-XII intact.  PSYCH: normal mood. Good eye contact  Arthritic hands/wrists.  Fused joints.  L middle finger-healing abrasion base nail. Some erythema.  No edema, no fluctuance.   L wrist-no boney TTP.  Some mild pain w/movements.      Assessment & Plan:   Problem List Items Addressed This Visit       Cardiovascular and Mediastinum   Hypertension - Primary   Relevant Orders   CBC with Differential/Platelet (Completed)     Endocrine   Hypothyroidism   Relevant Orders   TSH (Completed)   T3, free (Completed)   T4, free (Completed)     Other   Seizures (HCC)   Relevant Orders   Comprehensive metabolic panel (Completed)   CBC with Differential/Platelet (Completed)   Other Visit Diagnoses     Abrasion of left ring finger, initial encounter         1.  Hypertension-chronic.  Well-controlled.  Continue  amlodipine 10 mg.  Check CBC, CMP 2.  Hypothyroidism-chronic.  Well-controlled on levothyroxine 50.  Check TSH, free T3, free T4 3.  Seizure disorder-chronic.  Very well controlled.  No breakthrough.  Continue phenobarbital 32.4 mg and Tegretol 500 mg.  Check CBC, CMP due to meds 4.  Abrasion left ring finger-there is some redness, but no other signs of infection.  I believe this is just due to trauma.  Advised to monitor very closely.  If increased pain, pus, redness extends beyond where it is, then will do Keflex 500 mg twice daily.  No orders of the defined types were placed in this encounter.   Wellington Hampshire, MD

## 2021-12-27 NOTE — Patient Instructions (Signed)
It was very nice to see you today!  If finger gets more red, squishy, pus, call.    PLEASE NOTE:  If you had any lab tests please let us know if you have not heard back within a few days. You may see your results on MyChart before we have a chance to review them but we will give you a call once they are reviewed by [place]. If we ordered any referrals today, please let us know if you have not heard from their office within the next week.   Please try these tips to maintain a healthy lifestyle:  Eat most of your calories during the day when you are active. Eliminate processed foods including packaged sweets (pies, cakes, cookies), reduce intake of potatoes, white bread, white pasta, and white rice. Look for whole grain options, oat flour or almond flour.  Each meal should contain half fruits/vegetables, one quarter protein, and one quarter carbs (no bigger than a computer mouse).  Cut down on sweet beverages. This includes juice, soda, and sweet tea. Also watch fruit intake, though this is a healthier sweet option, it still contains natural sugar! Limit to 3 servings daily.  Drink at least 1 glass of water with each meal and aim for at least 8 glasses per day  Exercise at least 150 minutes every week.

## 2021-12-29 ENCOUNTER — Other Ambulatory Visit: Payer: Self-pay | Admitting: *Deleted

## 2021-12-29 DIAGNOSIS — R7989 Other specified abnormal findings of blood chemistry: Secondary | ICD-10-CM

## 2021-12-29 NOTE — Progress Notes (Signed)
lft

## 2022-01-09 DIAGNOSIS — J301 Allergic rhinitis due to pollen: Secondary | ICD-10-CM | POA: Diagnosis not present

## 2022-01-17 DIAGNOSIS — L570 Actinic keratosis: Secondary | ICD-10-CM | POA: Diagnosis not present

## 2022-01-17 DIAGNOSIS — D1801 Hemangioma of skin and subcutaneous tissue: Secondary | ICD-10-CM | POA: Diagnosis not present

## 2022-01-17 DIAGNOSIS — Z1283 Encounter for screening for malignant neoplasm of skin: Secondary | ICD-10-CM | POA: Diagnosis not present

## 2022-01-17 DIAGNOSIS — Z872 Personal history of diseases of the skin and subcutaneous tissue: Secondary | ICD-10-CM | POA: Diagnosis not present

## 2022-01-17 DIAGNOSIS — D692 Other nonthrombocytopenic purpura: Secondary | ICD-10-CM | POA: Diagnosis not present

## 2022-01-17 DIAGNOSIS — L821 Other seborrheic keratosis: Secondary | ICD-10-CM | POA: Diagnosis not present

## 2022-01-17 DIAGNOSIS — D225 Melanocytic nevi of trunk: Secondary | ICD-10-CM | POA: Diagnosis not present

## 2022-01-17 DIAGNOSIS — L814 Other melanin hyperpigmentation: Secondary | ICD-10-CM | POA: Diagnosis not present

## 2022-01-17 DIAGNOSIS — L578 Other skin changes due to chronic exposure to nonionizing radiation: Secondary | ICD-10-CM | POA: Diagnosis not present

## 2022-01-17 DIAGNOSIS — D2270 Melanocytic nevi of unspecified lower limb, including hip: Secondary | ICD-10-CM | POA: Diagnosis not present

## 2022-01-17 DIAGNOSIS — D2260 Melanocytic nevi of unspecified upper limb, including shoulder: Secondary | ICD-10-CM | POA: Diagnosis not present

## 2022-01-17 DIAGNOSIS — L820 Inflamed seborrheic keratosis: Secondary | ICD-10-CM | POA: Diagnosis not present

## 2022-01-18 DIAGNOSIS — Z0000 Encounter for general adult medical examination without abnormal findings: Secondary | ICD-10-CM | POA: Diagnosis not present

## 2022-01-20 DIAGNOSIS — G5603 Carpal tunnel syndrome, bilateral upper limbs: Secondary | ICD-10-CM | POA: Diagnosis not present

## 2022-01-23 DIAGNOSIS — R269 Unspecified abnormalities of gait and mobility: Secondary | ICD-10-CM | POA: Diagnosis not present

## 2022-01-23 DIAGNOSIS — J45909 Unspecified asthma, uncomplicated: Secondary | ICD-10-CM | POA: Diagnosis not present

## 2022-01-23 DIAGNOSIS — G40909 Epilepsy, unspecified, not intractable, without status epilepticus: Secondary | ICD-10-CM | POA: Diagnosis not present

## 2022-01-23 DIAGNOSIS — Z01818 Encounter for other preprocedural examination: Secondary | ICD-10-CM | POA: Diagnosis not present

## 2022-01-23 DIAGNOSIS — I10 Essential (primary) hypertension: Secondary | ICD-10-CM | POA: Diagnosis not present

## 2022-01-23 DIAGNOSIS — G629 Polyneuropathy, unspecified: Secondary | ICD-10-CM | POA: Diagnosis not present

## 2022-01-23 DIAGNOSIS — M4802 Spinal stenosis, cervical region: Secondary | ICD-10-CM | POA: Diagnosis not present

## 2022-01-23 DIAGNOSIS — J449 Chronic obstructive pulmonary disease, unspecified: Secondary | ICD-10-CM | POA: Diagnosis not present

## 2022-01-23 DIAGNOSIS — G5601 Carpal tunnel syndrome, right upper limb: Secondary | ICD-10-CM | POA: Diagnosis not present

## 2022-01-31 DIAGNOSIS — G5601 Carpal tunnel syndrome, right upper limb: Secondary | ICD-10-CM | POA: Diagnosis not present

## 2022-01-31 DIAGNOSIS — J45909 Unspecified asthma, uncomplicated: Secondary | ICD-10-CM | POA: Diagnosis not present

## 2022-01-31 DIAGNOSIS — M1990 Unspecified osteoarthritis, unspecified site: Secondary | ICD-10-CM | POA: Diagnosis not present

## 2022-01-31 DIAGNOSIS — E039 Hypothyroidism, unspecified: Secondary | ICD-10-CM | POA: Diagnosis not present

## 2022-01-31 DIAGNOSIS — J449 Chronic obstructive pulmonary disease, unspecified: Secondary | ICD-10-CM | POA: Diagnosis not present

## 2022-01-31 DIAGNOSIS — G501 Atypical facial pain: Secondary | ICD-10-CM | POA: Diagnosis not present

## 2022-01-31 DIAGNOSIS — I10 Essential (primary) hypertension: Secondary | ICD-10-CM | POA: Diagnosis not present

## 2022-02-20 DIAGNOSIS — Z23 Encounter for immunization: Secondary | ICD-10-CM | POA: Diagnosis not present

## 2022-02-21 DIAGNOSIS — N281 Cyst of kidney, acquired: Secondary | ICD-10-CM | POA: Diagnosis not present

## 2022-02-21 DIAGNOSIS — N319 Neuromuscular dysfunction of bladder, unspecified: Secondary | ICD-10-CM | POA: Diagnosis not present

## 2022-02-21 DIAGNOSIS — N202 Calculus of kidney with calculus of ureter: Secondary | ICD-10-CM | POA: Diagnosis not present

## 2022-02-25 ENCOUNTER — Other Ambulatory Visit: Payer: Self-pay | Admitting: Family Medicine

## 2022-03-03 DIAGNOSIS — D485 Neoplasm of uncertain behavior of skin: Secondary | ICD-10-CM | POA: Diagnosis not present

## 2022-03-10 DIAGNOSIS — J449 Chronic obstructive pulmonary disease, unspecified: Secondary | ICD-10-CM | POA: Diagnosis not present

## 2022-03-10 DIAGNOSIS — Z23 Encounter for immunization: Secondary | ICD-10-CM | POA: Diagnosis not present

## 2022-03-21 ENCOUNTER — Other Ambulatory Visit: Payer: Self-pay | Admitting: Family Medicine

## 2022-03-21 DIAGNOSIS — I10 Essential (primary) hypertension: Secondary | ICD-10-CM

## 2022-04-11 DIAGNOSIS — J449 Chronic obstructive pulmonary disease, unspecified: Secondary | ICD-10-CM | POA: Diagnosis not present

## 2022-04-11 DIAGNOSIS — R059 Cough, unspecified: Secondary | ICD-10-CM | POA: Diagnosis not present

## 2022-04-11 DIAGNOSIS — R0602 Shortness of breath: Secondary | ICD-10-CM | POA: Diagnosis not present

## 2022-04-11 DIAGNOSIS — U071 COVID-19: Secondary | ICD-10-CM | POA: Diagnosis not present

## 2022-04-27 DIAGNOSIS — J301 Allergic rhinitis due to pollen: Secondary | ICD-10-CM | POA: Diagnosis not present

## 2022-05-03 DIAGNOSIS — J301 Allergic rhinitis due to pollen: Secondary | ICD-10-CM | POA: Diagnosis not present

## 2022-05-11 ENCOUNTER — Encounter: Payer: Self-pay | Admitting: *Deleted

## 2022-05-18 DIAGNOSIS — M4802 Spinal stenosis, cervical region: Secondary | ICD-10-CM | POA: Diagnosis not present

## 2022-05-18 DIAGNOSIS — G629 Polyneuropathy, unspecified: Secondary | ICD-10-CM | POA: Diagnosis not present

## 2022-05-18 DIAGNOSIS — R252 Cramp and spasm: Secondary | ICD-10-CM | POA: Diagnosis not present

## 2022-06-07 DIAGNOSIS — C44729 Squamous cell carcinoma of skin of left lower limb, including hip: Secondary | ICD-10-CM | POA: Diagnosis not present

## 2022-06-09 ENCOUNTER — Other Ambulatory Visit: Payer: Self-pay | Admitting: Family Medicine

## 2022-06-09 DIAGNOSIS — J449 Chronic obstructive pulmonary disease, unspecified: Secondary | ICD-10-CM | POA: Diagnosis not present

## 2022-06-13 ENCOUNTER — Other Ambulatory Visit: Payer: Self-pay | Admitting: Family Medicine

## 2022-06-13 DIAGNOSIS — Z1231 Encounter for screening mammogram for malignant neoplasm of breast: Secondary | ICD-10-CM

## 2022-06-15 ENCOUNTER — Other Ambulatory Visit: Payer: Self-pay | Admitting: Family Medicine

## 2022-06-15 DIAGNOSIS — R569 Unspecified convulsions: Secondary | ICD-10-CM

## 2022-06-15 MED ORDER — PHENOBARBITAL 32.4 MG PO TABS: ORAL_TABLET | ORAL | 1 refills | Status: DC

## 2022-06-19 DIAGNOSIS — J449 Chronic obstructive pulmonary disease, unspecified: Secondary | ICD-10-CM | POA: Diagnosis not present

## 2022-06-19 DIAGNOSIS — I10 Essential (primary) hypertension: Secondary | ICD-10-CM | POA: Diagnosis not present

## 2022-06-19 DIAGNOSIS — G5602 Carpal tunnel syndrome, left upper limb: Secondary | ICD-10-CM | POA: Diagnosis not present

## 2022-06-19 DIAGNOSIS — G40909 Epilepsy, unspecified, not intractable, without status epilepticus: Secondary | ICD-10-CM | POA: Diagnosis not present

## 2022-06-29 ENCOUNTER — Ambulatory Visit (INDEPENDENT_AMBULATORY_CARE_PROVIDER_SITE_OTHER): Payer: Medicare Other | Admitting: Family Medicine

## 2022-06-29 ENCOUNTER — Encounter: Payer: Self-pay | Admitting: Family Medicine

## 2022-06-29 VITALS — BP 122/62 | HR 81 | Temp 97.5°F | Resp 16 | Ht 62.0 in | Wt 129.0 lb

## 2022-06-29 DIAGNOSIS — R569 Unspecified convulsions: Secondary | ICD-10-CM

## 2022-06-29 DIAGNOSIS — E038 Other specified hypothyroidism: Secondary | ICD-10-CM | POA: Diagnosis not present

## 2022-06-29 DIAGNOSIS — E538 Deficiency of other specified B group vitamins: Secondary | ICD-10-CM

## 2022-06-29 DIAGNOSIS — E7800 Pure hypercholesterolemia, unspecified: Secondary | ICD-10-CM | POA: Diagnosis not present

## 2022-06-29 DIAGNOSIS — I10 Essential (primary) hypertension: Secondary | ICD-10-CM

## 2022-06-29 LAB — COMPREHENSIVE METABOLIC PANEL
ALT: 17 U/L (ref 0–35)
AST: 24 U/L (ref 0–37)
Albumin: 4.2 g/dL (ref 3.5–5.2)
Alkaline Phosphatase: 86 U/L (ref 39–117)
BUN: 33 mg/dL — ABNORMAL HIGH (ref 6–23)
CO2: 30 mEq/L (ref 19–32)
Calcium: 9.6 mg/dL (ref 8.4–10.5)
Chloride: 104 mEq/L (ref 96–112)
Creatinine, Ser: 1 mg/dL (ref 0.40–1.20)
GFR: 50.54 mL/min — ABNORMAL LOW (ref >60.00)
Glucose, Bld: 88 mg/dL (ref 70–99)
Potassium: 4.2 mEq/L (ref 3.5–5.1)
Sodium: 141 mEq/L (ref 135–145)
Total Bilirubin: 0.4 mg/dL (ref 0.2–1.2)
Total Protein: 6.9 g/dL (ref 6.0–8.3)

## 2022-06-29 LAB — CBC WITH DIFFERENTIAL/PLATELET
Basophils Absolute: 0.1 10*3/uL (ref 0.0–0.1)
Basophils Relative: 0.9 % (ref 0.0–3.0)
Eosinophils Absolute: 0.1 10*3/uL (ref 0.0–0.7)
Eosinophils Relative: 1.1 % (ref 0.0–5.0)
HCT: 44.7 % (ref 36.0–46.0)
Hemoglobin: 15.2 g/dL — ABNORMAL HIGH (ref 12.0–15.0)
Lymphocytes Relative: 19.3 % (ref 12.0–46.0)
Lymphs Abs: 1.3 10*3/uL (ref 0.7–4.0)
MCHC: 34.1 g/dL (ref 30.0–36.0)
MCV: 92.2 fl (ref 78.0–100.0)
Monocytes Absolute: 0.4 10*3/uL (ref 0.1–1.0)
Monocytes Relative: 5.4 % (ref 3.0–12.0)
Neutro Abs: 5 10*3/uL (ref 1.4–7.7)
Neutrophils Relative %: 73.3 % (ref 43.0–77.0)
Platelets: 263 10*3/uL (ref 150.0–400.0)
RBC: 4.85 Mil/uL (ref 3.87–5.11)
RDW: 13.5 % (ref 11.5–15.5)
WBC: 6.8 10*3/uL (ref 4.0–10.5)

## 2022-06-29 LAB — LIPID PANEL
Cholesterol: 235 mg/dL — ABNORMAL HIGH (ref 0–200)
HDL: 74.3 mg/dL (ref >39.00)
LDL Cholesterol: 138 mg/dL — ABNORMAL HIGH (ref 0–99)
NonHDL: 160.56
Total CHOL/HDL Ratio: 3
Triglycerides: 114 mg/dL (ref 0.0–149.0)
VLDL: 22.8 mg/dL (ref 0.0–40.0)

## 2022-06-29 LAB — TSH: TSH: 1.81 u[IU]/mL (ref 0.35–5.50)

## 2022-06-29 LAB — VITAMIN B12: Vitamin B-12: 1044 pg/mL — ABNORMAL HIGH (ref 211–911)

## 2022-06-29 NOTE — Progress Notes (Signed)
Subjective:     Patient ID: [name], female    DOB: [birth year], 89 y.o.   MRN: [id number]  Chief Complaint  Patient presents with   Follow-up    Follow up    HPI-here w/husb  Sz-on phenobarb and tegretol-doing well.  No sz.  HTN-Pt is on amlodipine '10mg'$    Bp's running  130/60-70.  No ha/dizziness/cp/palp/edema/cough/sob  HLD-on lipitor '40mg'$ -no unusual myalgias Hypothyroidism-on synthroid 50 SCC-L thigh.- had removed.  Has f/u Derm   BCTS surgery  There are no preventive care reminders to display for this patient.  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 ([place]) 03/2011   chronic sinusitis 08/08/2012   Colon polyp    Constipation    COPD (chronic obstructive pulmonary disease) ([place])    Cystocele 09/16/2012   GERD (gastroesophageal reflux disease)    Hyperlipidemia    Hypertension    Hypothyroid    Neurogenic bladder 2013   husband caths pt, manages with timed void   Osteoporosis    Pulmonary embolism ([place])    1980s   Raynaud phenomenon    Renal cyst    Non-complex, contrast MRI 2013 with L>R non-complex cysts, dominant 3.7 left lower pole   Seizures ([place])    1990 last seizures on meds Phenobarb   Shingles late 90's   Skin cancer    squamous cell   Thyroid nodule    Right thyroid lobe, only seen on Sagittal imaging measures 2.3 cm in craniocaudal dimension and appears stable   Tubular adenoma    Vitamin D deficiency     Past Surgical History:  Procedure Laterality Date   ABDOMINAL HYSTERECTOMY     ANTERIOR FUSION CLIVUS-C2 EXTRAORAL W/ ODONTOID EXCISION  12/2012   APPENDECTOMY     ARTHROSCOPIC REPAIR ACL Left 1976   BASAL CELL CARCINOMA EXCISION     face   BLADDER SUSPENSION     BREAST DUCTAL SYSTEM EXCISION Right 01/15/2014    Procedure: EXCISION DUCTAL SYSTEM RIGHT BREAST;  Surgeon: Stark Klein, MD;  Location: [place];  Service: General;  Laterality: Right;   BREAST EXCISIONAL BIOPSY Right    CARPAL TUNNEL RELEASE     right   CATARACT EXTRACTION     bilateral   cervical neck ablation     x 7, C3-C6/3 screws and plate   CESAREAN SECTION     CYSTOCELE REPAIR     DILATION AND CURETTAGE OF UTERUS     duptyren's contracture right hand     FINGER ARTHRODESIS Right 05/06/2015   Procedure: RIGHT RING PROXIMAL INTERPHALANGEAL FUSION (PIP);  Surgeon: Leanora Cover, MD;  Location: [place];  Service: Orthopedics;  Laterality: Right;   FINGER GANGLION CYST EXCISION     right   HEMICOLECTOMY Right    JOINT REPLACEMENT     right and left basal joints of thumbs   left finger fusion     3 fingers on left hand/one finger right   left ovary and tube removed     NASAL POLYP SURGERY     4 sinus surgeries   NASAL SEPTUM SURGERY     PANNICULECTOMY     PROXIMAL INTERPHALANGEAL FUSION (PIP) Left 09/09/2013  Procedure: FUSION LEFT INDEX PROXIMAL INTERPHALANGEAL JOINT (PIP);  Surgeon: Cammie Sickle., MD;  Location: [facility];  Service: Orthopedics;  Laterality: Left;   right median nerve decompression     SHOULDER ARTHROSCOPY     x2 left, 1 right   sinus surgeries     x 4   squamous lesions removed     neck and face   STERIOD INJECTION Right 05/06/2015   Procedure: STEROID INJECTION;  Surgeon: Leanora Cover, MD;  Location: [place];  Service: Orthopedics;  Laterality: Right;  Right Index Finger Proximal InterPhalangeal Joint Injection   TONSILLECTOMY AND ADENOIDECTOMY     TUBAL LIGATION     vocal polyps removed      Outpatient Medications Prior to Visit  Medication Sig Dispense Refill   albuterol (PROVENTIL) (2.5 MG/3ML) 0.083% nebulizer solution USE 1 VIAL IN NEBULIZER 4 TIMES DAILY     amLODipine (NORVASC) 10 MG tablet TAKE 1 TABLET(10 MG) BY  MOUTH DAILY 90 tablet 3   arformoterol (BROVANA) 15 MCG/2ML NEBU USE 1 VIAL IN NEBULIZER TWICE DAILY     atorvastatin (LIPITOR) 40 MG tablet TAKE 1 TABLET BY MOUTH EVERY NIGHT AT BEDTIME 90 tablet 1   budesonide (PULMICORT) 0.25 MG/2ML nebulizer solution Take by nebulization 2 (two) times daily.     Calcium Carb-Cholecalciferol 500-125 MG-UNIT TABS Take 1,200 mg by mouth.     Cholecalciferol 25 MCG (1000 UT) capsule Take by mouth.     gabapentin (NEURONTIN) 300 MG capsule Take 300 mg by mouth at bedtime.     levothyroxine (SYNTHROID) 50 MCG tablet TAKE 1 TABLET(50 MCG) BY MOUTH DAILY BEFORE AND BREAKFAST 90 tablet 3   montelukast (SINGULAIR) 10 MG tablet TAKE 1 TABLET(10 MG) BY MOUTH AT BEDTIME 90 tablet 3   mupirocin ointment (BACTROBAN) 2 % ADD ONE INCH OF OINTMENT TO 8 OUNCES OF SINUS RINSE BOTTLE DAILY     PHENobarbital (LUMINAL) 32.4 MG tablet TAKE 1 TABLET BY MOUTH AT 6 AM AND 2 TABLETS AT 8 PM 270 tablet 1   PREDNISONE PO Take 10 mg by mouth daily.     TEGRETOL-XR 100 MG 12 hr tablet Take 500 mg by mouth daily. Takes 2 tabs at night  1   VENTOLIN HFA 108 (90 Base) MCG/ACT inhaler SMARTSIG:1 Puff(s) Via Inhaler Every 4 Hours PRN     No facility-administered medications prior to visit.    Allergies  Allergen Reactions   Iodinated Contrast Media Anaphylaxis    Cardiac arrest   Iodine Other (See Comments)    Cardiac arrest As young adult, received iodinated contrast for IVP, went into cardiac arrest, was resuscitated and hospitalized. Has not received iodinated contrast since that episode. Reports no adverse reaction to betadine.    Morphine Other (See Comments)    Cardiac arrest Patient has previously tolerated hydrocodone, hydromorphone and fentanyl    Morphine And Related Other (See Comments)    Cardiac arrest   Cocklebur    Lambs Quarters    Metrizamide Other (See Comments)    Cardiac arrest Cardiac arrest    Mugwort    Sheep Sorrel    Spiny Pigweed (Amaranthus Spinosus)  Skin Test    ROS neg/noncontributory except as noted HPI/below      Objective:     BP 122/62 (BP Location: Right Arm, Patient Position: Sitting, Cuff Size: Normal)   Pulse 81   Temp (!) 97.5 F (36.4 C) (Oral)   Resp 16   Ht 5'  2" (1.575 m)   Wt 129 lb (58.5 kg)   SpO2 96%   BMI 23.59 kg/m  Wt Readings from Last 3 Encounters:  06/29/22 129 lb (58.5 kg)  12/27/21 127 lb 4 oz (57.7 kg)  08/12/21 129 lb 8 oz (58.7 kg)    Physical Exam   Gen: WDWN NAD HEENT: NCAT, conjunctiva not injected, sclera nonicteric NECK:  supple, no thyromegaly, no nodes, no carotid bruits CARDIAC: RRR, S1S2+, no murmur. DP 2+B LUNGS: CTAB. No wheezes ABDOMEN:  BS+, soft, NTND, No HSM, no masses EXT:  no edema MSK: walker.  Splint L wrist NEURO: A&O x3.  CN II-XII intact.  PSYCH: normal mood. Good eye contact     Assessment & Plan:   Problem List Items Addressed This Visit       Cardiovascular and Mediastinum   Hypertension - Primary   Relevant Orders   Comprehensive metabolic panel   CBC with Differential/Platelet     Endocrine   Hypothyroidism   Relevant Orders   TSH     Other   Hyperlipidemia   Relevant Orders   Lipid panel   Seizures ([place])   Relevant Orders   Comprehensive metabolic panel   CBC with Differential/Platelet   Other Visit Diagnoses     Vitamin B12 deficiency       Relevant Orders   Vitamin B12     1.  Hypertension-chronic.  Well-controlled.  Continue amlodipine 10 mg daily.  Check CBC, CMP 2.  Hypothyroidism-chronic.  Well-controlled on Synthroid 50.  Check TSH 3.  Hyperlipidemia-chronic.  Well-controlled on Lipitor 40 mg.  Continue.  Check lipids, CMP 4.  Seizure disorder-chronic.  Very well-controlled on Tegretol XR 100 mg twice daily and phenobarbital 32.4 mg in the a.m. and 2 tablets in the p.m.  Continue meds.  Renewed recently.  Check CBC, CMP 5.  Vitamin B12 deficiency-chronic.  Check B12  F/u 88m No orders of the defined types were placed in  this encounter.   AWellington Hampshire MD

## 2022-06-29 NOTE — Patient Instructions (Signed)
It was very nice to see you today!  Stay healthy   PLEASE NOTE:  If you had any lab tests please let us know if you have not heard back within a few days. You may see your results on MyChart before we have a chance to review them but we will give you a call once they are reviewed by [place]. If we ordered any referrals today, please let us know if you have not heard from their office within the next week.   Please try these tips to maintain a healthy lifestyle:  Eat most of your calories during the day when you are active. Eliminate processed foods including packaged sweets (pies, cakes, cookies), reduce intake of potatoes, white bread, white pasta, and white rice. Look for whole grain options, oat flour or almond flour.  Each meal should contain half fruits/vegetables, one quarter protein, and one quarter carbs (no bigger than a computer mouse).  Cut down on sweet beverages. This includes juice, soda, and sweet tea. Also watch fruit intake, though this is a healthier sweet option, it still contains natural sugar! Limit to 3 servings daily.  Drink at least 1 glass of water with each meal and aim for at least 8 glasses per day  Exercise at least 150 minutes every week.

## 2022-06-29 NOTE — Progress Notes (Signed)
Labs are normal/stable except cholesterol is elevated more than usual.  Has she missed doses of her atorvastatin?

## 2022-07-03 DIAGNOSIS — Z4789 Encounter for other orthopedic aftercare: Secondary | ICD-10-CM | POA: Diagnosis not present

## 2022-07-07 ENCOUNTER — Telehealth: Payer: Self-pay | Admitting: Family Medicine

## 2022-07-11 ENCOUNTER — Other Ambulatory Visit: Payer: Self-pay | Admitting: Family Medicine

## 2022-07-11 NOTE — Telephone Encounter (Signed)
  Encourage patient to contact the pharmacy for refills or they can request refills through [facility]:  Please schedule appointment if longer than 1 year  NEXT APPOINTMENT DATE:  MEDICATION:  levothyroxine (SYNTHROID) 50 MCG tablet   Is the patient out of medication? YES  PHARMACY:  [id number] - [place], [address] AT [facility] [address] Phone: [phone]  Fax: [phone]      Let patient know to contact pharmacy at the end of the day to make sure medication is ready.  Please notify patient to allow 48-72 hours to process

## 2022-07-14 ENCOUNTER — Other Ambulatory Visit: Payer: Self-pay | Admitting: *Deleted

## 2022-07-14 MED ORDER — LEVOTHYROXINE SODIUM 50 MCG PO TABS: ORAL_TABLET | ORAL | 3 refills | Status: DC

## 2022-07-14 NOTE — Telephone Encounter (Signed)
Rx sent to the pharmacy.

## 2022-07-24 ENCOUNTER — Other Ambulatory Visit: Payer: Self-pay

## 2022-07-24 ENCOUNTER — Emergency Department (HOSPITAL_COMMUNITY): Payer: Medicare Other

## 2022-07-24 ENCOUNTER — Emergency Department (HOSPITAL_COMMUNITY)
Admission: EM | Admit: 2022-07-24 | Discharge: 2022-07-24 | Disposition: A | Discharge status: Home / Self Care | Payer: Medicare Other | Attending: Emergency Medicine | Admitting: Emergency Medicine

## 2022-07-24 DIAGNOSIS — W19XXXA Unspecified fall, initial encounter: Secondary | ICD-10-CM | POA: Diagnosis not present

## 2022-07-24 DIAGNOSIS — Z79899 Other long term (current) drug therapy: Secondary | ICD-10-CM | POA: Insufficient documentation

## 2022-07-24 DIAGNOSIS — M25551 Pain in right hip: Secondary | ICD-10-CM | POA: Diagnosis not present

## 2022-07-24 MED ORDER — LIDOCAINE 5 % EX PTCH
1.0000 | MEDICATED_PATCH | CUTANEOUS | Status: DC
  Administered 2022-07-24: 1 via TRANSDERMAL
  Filled 2022-07-24: qty 1

## 2022-07-24 MED ORDER — KETOROLAC TROMETHAMINE 15 MG/ML IJ SOLN
15.0000 mg | Freq: Once | INTRAMUSCULAR | Status: AC
  Administered 2022-07-24: 15 mg via INTRAMUSCULAR
  Filled 2022-07-24: qty 1

## 2022-07-24 MED ORDER — METHOCARBAMOL 500 MG PO TABS: 500.0000 mg | ORAL_TABLET | Freq: Four times a day (QID) | ORAL | 0 refills | Status: DC | PRN

## 2022-07-24 MED ORDER — OXYCODONE HCL 5 MG PO TABS: 2.5000 mg | ORAL_TABLET | Freq: Four times a day (QID) | ORAL | 0 refills | Status: DC | PRN

## 2022-07-24 MED ORDER — OXYCODONE HCL 5 MG PO TABS
5.0000 mg | ORAL_TABLET | Freq: Once | ORAL | Status: AC
  Administered 2022-07-24: 5 mg via ORAL
  Filled 2022-07-24: qty 1

## 2022-07-24 NOTE — ED Notes (Signed)
Pt states that when she is laying still, she is in no pain, however when she is moving, her pain can be a 7/10.

## 2022-07-24 NOTE — ED Provider Notes (Signed)
[facility] Provider Note   CSN: [id number] Arrival date & time: 07/24/22  [id number]     History  No chief complaint on file.   [name] is a 89 y.o. female.  Patient presents to the emergency department today for evaluation of right hip and posterior pelvis pain.  Patient walks with a rollator at baseline.  She started having pain after waking up yesterday.  No new injuries or falls.  She did have a fall about 2 weeks ago, but recovered from this without any difficulties.  Patient cannot ambulate with assistance per usual.  Husband was helping to take care of her, but could not help her get up to the restroom.  When she could not ambulate again this morning, EMS was called for transport.  Pain is not present while sitting still but is worse with movement.  Over-the-counter medications have not helped.  Patient has peripheral neuropathy which is at baseline.  No weakness.  No fevers, UTI symptoms.  Patient does use some sort of exercise bike twice a day at baseline.      Home Medications Prior to Admission medications   Medication Sig Start Date End Date Taking? Authorizing Provider  albuterol (PROVENTIL) (2.5 MG/3ML) 0.083% nebulizer solution USE 1 VIAL IN NEBULIZER 4 TIMES DAILY 02/24/19   [provider]  amLODipine (NORVASC) 10 MG tablet TAKE 1 TABLET(10 MG) BY MOUTH DAILY 03/21/22   Tawnya Crook, MD  arformoterol [facility]) 15 MCG/2ML NEBU USE 1 VIAL IN NEBULIZER TWICE DAILY 08/08/21   [provider]  atorvastatin (LIPITOR) 40 MG tablet TAKE 1 TABLET BY MOUTH EVERY NIGHT AT BEDTIME 06/09/22   Tawnya Crook, MD  budesonide (PULMICORT) 0.25 MG/2ML nebulizer solution Take by nebulization 2 (two) times daily. 07/22/21   [provider]  Calcium Carb-Cholecalciferol 500-125 MG-UNIT TABS Take 1,200 mg by mouth.    [provider]  Cholecalciferol 25 MCG (1000 UT) capsule Take by mouth.    [provider]  gabapentin (NEURONTIN) 300 MG capsule Take 300 mg by mouth at bedtime. 07/21/19   [provider]  levothyroxine (SYNTHROID) 50 MCG tablet TAKE 1 TABLET(50 MCG) BY MOUTH DAILY BEFORE BREAKFAST 07/14/22   Tawnya Crook, MD  montelukast (SINGULAIR) 10 MG tablet TAKE 1 TABLET(10 MG) BY MOUTH AT BEDTIME 03/21/22   Tawnya Crook, MD  mupirocin ointment (BACTROBAN) 2 % ADD ONE [facility] OF OINTMENT TO 8 OUNCES OF SINUS RINSE BOTTLE DAILY 07/20/16   [provider]  PHENobarbital (LUMINAL) 32.4 MG tablet TAKE 1 TABLET BY MOUTH AT 6 AM AND 2 TABLETS AT 8 PM 06/15/22   Tawnya Crook, MD  PREDNISONE PO Take 10 mg by mouth daily.    [provider]  TEGRETOL-XR 100 MG 12 hr tablet Take 500 mg by mouth daily. Takes 2 tabs at night 03/25/15   [provider]  VENTOLIN HFA 108 (90 Base) MCG/ACT inhaler SMARTSIG:1 Puff(s) Via Inhaler Every 4 Hours PRN 06/10/19   [provider]      Allergies    Iodinated contrast media, Iodine, Morphine, Morphine and related, Cocklebur, Lambs quarters, Metrizamide, Mugwort, Sheep sorrel, and Spiny pigweed (amaranthus spinosus) skin test    Review of Systems   Review of Systems  Physical Exam Updated Vital Signs BP 131/70   Pulse 73   Temp (!) 97.5 F (36.4 C) (Oral)   Resp 15   SpO2 100%   Physical Exam Vitals  and nursing note reviewed.  Constitutional:      General: She is not in acute distress.    Appearance: She is well-developed.  HENT:     Head: Normocephalic and atraumatic.     Right Ear: External ear normal.     Left Ear: External ear normal.     Nose: Nose normal.  Eyes:     Conjunctiva/sclera: Conjunctivae normal.  Cardiovascular:     Rate and Rhythm: Normal rate and regular rhythm.     Heart sounds: No murmur heard. Pulmonary:     Effort: No respiratory distress.     Breath sounds: No wheezing, rhonchi or rales.  Abdominal:     Palpations: Abdomen is soft.     Tenderness: There is  no abdominal tenderness. There is no guarding or rebound.     Comments: No abdominal tenderness to palpation.  Musculoskeletal:     Cervical back: Normal range of motion and neck supple.     Right lower leg: No edema.     Left lower leg: No edema.  Skin:    General: Skin is warm and dry.     Findings: No rash.  Neurological:     General: No focal deficit present.     Mental Status: She is alert. Mental status is at baseline.     Motor: No weakness.  Psychiatric:        Mood and Affect: Mood normal.    ED Results / Procedures / Treatments   Labs (all labs ordered are listed, but only abnormal results are displayed) Labs Reviewed - No data to display  EKG None  Radiology DG Hip Unilat W or Wo Pelvis 2-3 Views Right  Result Date: 07/24/2022 CLINICAL DATA:  Posterior hip pain starting yesterday. [place] about 2 weeks ago. EXAM: DG HIP (WITH OR WITHOUT PELVIS) 2-3V RIGHT COMPARISON:  None Available. FINDINGS: Bones are diffusely demineralized. No evidence for an acute fracture in the pelvis. SI joints and symphysis pubis unremarkable. AP and frog-leg lateral views of the right hip show no evidence for an acute femoral neck fracture. IMPRESSION: Negative. Electronically Signed   By: Misty Stanley M.D.   On: 07/24/2022 07:29    Procedures Procedures    Medications Ordered in ED Medications  oxyCODONE (Oxy IR/ROXICODONE) immediate release tablet 5 mg (5 mg Oral Given 07/24/22 0805)  ketorolac (TORADOL) 15 MG/ML injection 15 mg (15 mg Intramuscular Given 07/24/22 0759)    ED Course/ Medical Decision Making/ A&P    Patient seen and examined. History obtained directly from patient.   Labs/EKG: Ordered none  Imaging: Ordered X-ray pelvis and hip.  Medications/Fluids: Ordered: P.o. oxycodone, Lidoderm patch, IM Toradol  Most recent vital signs reviewed and are as follows: BP 131/70   Pulse 73   Temp (!) 97.5 F (36.4 C) (Oral)   Resp 15   SpO2 100%   Initial impression: Right  hip and pelvic pain.  8:45 AM Reassessment performed. Patient appears stable.  Imaging personally visualized and interpreted including: X-ray of the hip and pelvis, agree no fracture  Reviewed pertinent lab work and imaging with patient and husband at bedside. Questions answered.   Most current vital signs reviewed and are as follows: BP (!) 129/59   Pulse 67   Temp (!) 97.5 F (36.4 C) (Oral)   Resp 12   SpO2 100%   Plan: Discharge to home.  Patient uses a rollator and does not typically bear weight.  She does help with transfers.  Will make sure she can sit up in the bed more comfortably.  Husband has the patient's rollator and can take her home today.  Will plan to provide some Robaxin and continue Lidoderm patches at home.  Will give low-dose oxycodone to use under supervision for more significant pain.  Patient has recently been on this for carpal tunnel surgery.  Patient and husband are in agreement with this plan.   Prior to discharge, patient transferred more comfortably.  Plan to discharge to home.  Use pain medication only under direct supervision at the lowest possible dose needed to control your pain.                               Medical Decision Making Amount and/or Complexity of Data Reviewed Radiology: ordered.  Risk Prescription drug management.   Patient presents with atraumatic hip pain.  X-rays are negative.  Patient is physically active.  Pain is most suggestive of musculoskeletal pain or possibly radicular type pain.  No red flags.  Pain controlled in the emergency room.  She has family that is supportive and will watch her at home.  No indication for CT imaging or additional imaging.    The patient's vital signs, pertinent lab work and imaging were reviewed and interpreted as discussed in the ED course. Hospitalization was considered for further testing, treatments, or serial exams/observation. However as patient is well-appearing, has a stable exam, and  reassuring studies today, I do not feel that they warrant admission at this time. This plan was discussed with the patient who verbalizes agreement and comfort with this plan and seems reliable and able to return to the Emergency Department with worsening or changing symptoms.          Final Clinical Impression(s) / ED Diagnoses Final diagnoses:  Right hip pain    Rx / DC Orders ED Discharge Orders          Ordered    methocarbamol (ROBAXIN) 500 MG tablet  Every 6 hours PRN        07/24/22 0934    oxyCODONE (OXY IR/ROXICODONE) 5 MG immediate release tablet  Every 6 hours PRN        07/24/22 0934              Carlisle Cater, PA-C 07/24/22 [place], [place], DO 07/25/22 [phone]

## 2022-07-24 NOTE — Discharge Instructions (Signed)
Please read and follow all provided instructions.  Your diagnoses today include:  1. Right hip pain    Tests performed today include: An x-ray of the affected area - does NOT show any broken bones Vital signs. See below for your results today.   Medications prescribed:  Robaxin (methocarbamol) - muscle relaxer medication  DO NOT drive or perform any activities that require you to be awake and alert because this medicine can make you drowsy.   Percocet (oxycodone/acetaminophen) - narcotic pain medication  DO NOT drive or perform any activities that require you to be awake and alert because this medicine can make you drowsy. BE VERY CAREFUL not to take multiple medicines containing Tylenol (also called acetaminophen). Doing so can lead to an overdose which can damage your liver and cause liver failure and possibly death.  Use pain medication only under direct supervision at the lowest possible dose needed to control your pain.   Take any prescribed medications only as directed.  Home care instructions:  Follow any educational materials contained in this packet Follow R.I.C.E. Protocol: R - rest your injury  I  - use ice on injury without applying directly to skin C - compress injury with bandage or splint E - elevate the injury as much as possible  Follow-up instructions: Please follow-up with your primary care provider.   Return instructions:  Please return if your toes or feet are numb or tingling, appear gray or blue, or you have severe pain (also elevate the leg and loosen splint or wrap if you were given one) Please return to the Emergency Department if you experience worsening symptoms.  Please return if you have any other emergent concerns.  Additional Information:  Your vital signs today were: BP (!) 129/59   Pulse 67   Temp (!) 97.5 F (36.4 C) (Oral)   Resp 12   SpO2 100%  If your blood pressure (BP) was elevated above 135/85 this visit, please have this  repeated by your doctor within one month. --------------

## 2022-07-24 NOTE — ED Triage Notes (Signed)
Pt BIB by [facility] from home. Pt is having Right hip pain. Pt had a fall a week ago. Pt has had no pain but stated that she started having pain yesterday, and it has worsen today. Pain does not radiate. Pt is unable to move at all without having pain.   LVS  142/92  HR 75  RR 18  98% RA

## 2022-07-26 ENCOUNTER — Encounter: Payer: Self-pay | Admitting: Family

## 2022-07-26 ENCOUNTER — Ambulatory Visit (INDEPENDENT_AMBULATORY_CARE_PROVIDER_SITE_OTHER): Payer: Medicare Other | Admitting: Family

## 2022-07-26 VITALS — BP 140/73 | HR 79 | Temp 98.2°F | Ht 62.0 in | Wt 130.2 lb

## 2022-07-26 DIAGNOSIS — M25551 Pain in right hip: Secondary | ICD-10-CM | POA: Diagnosis not present

## 2022-07-26 NOTE — Progress Notes (Signed)
Patient ID: [name], female    DOB: [birth year], 89 y.o.   MRN: [id number]  Chief Complaint  Patient presents with   Follow-up    ED On 2/26, Unable to get out of bed due to pain on right lower back. Has been using a heating pad, oxycodone, methocarbamol. Pt states she is feeling a lot better.     HPI: ED f/u:  seen in ED on 2/26 due severe right hip pain, unrelated to a fall or injury,per review of ED notes, imaging neg. She has used a walker to ambulate prior to injury d/t chronic neuropathy. Given OXY, Robaxin & Lidoderm patches via ED. Pt & husb present both state she is doing much better.  Assessment & Plan:  1. Acute right hip pain - advised pt to continue POC with OXY pills for severe pain or help with sleep, Robaxin during day prn. Can continue the Lidoderm patches, and these are also available OTC prn, Can apply heat up to 4mng prn tid. Continue to do ROM exercises as able with hip to improve flexibility and decrease stiffness.  Subjective:    Outpatient Medications Prior to Visit  Medication Sig Dispense Refill   albuterol (PROVENTIL) (2.5 MG/3ML) 0.083% nebulizer solution USE 1 VIAL IN NEBULIZER 4 TIMES DAILY     amLODipine (NORVASC) 10 MG tablet TAKE 1 TABLET(10 MG) BY MOUTH DAILY 90 tablet 3   arformoterol (BROVANA) 15 MCG/2ML NEBU USE 1 VIAL IN NEBULIZER TWICE DAILY     atorvastatin (LIPITOR) 40 MG tablet TAKE 1 TABLET BY MOUTH EVERY NIGHT AT BEDTIME 90 tablet 1   budesonide (PULMICORT) 0.25 MG/2ML nebulizer solution Take by nebulization 2 (two) times daily.     Calcium Carb-Cholecalciferol 500-125 MG-UNIT TABS Take 1,200 mg by mouth.     Cholecalciferol 25 MCG (1000 UT) capsule Take by mouth.     gabapentin (NEURONTIN) 300 MG capsule Take 300 mg by mouth at bedtime.     levothyroxine (SYNTHROID) 50 MCG tablet TAKE 1 TABLET(50 MCG) BY MOUTH DAILY BEFORE BREAKFAST 90 tablet 3   methocarbamol (ROBAXIN) 500 MG tablet Take 1 tablet (500 mg total) by mouth every 6  (six) hours as needed for muscle spasms. 20 tablet 0   montelukast (SINGULAIR) 10 MG tablet TAKE 1 TABLET(10 MG) BY MOUTH AT BEDTIME 90 tablet 3   mupirocin ointment (BACTROBAN) 2 % ADD ONE INCH OF OINTMENT TO 8 OUNCES OF SINUS RINSE BOTTLE DAILY     oxyCODONE (OXY IR/ROXICODONE) 5 MG immediate release tablet Take 0.5-1 tablets (2.5-5 mg total) by mouth every 6 (six) hours as needed for severe pain. 5 tablet 0   PHENobarbital (LUMINAL) 32.4 MG tablet TAKE 1 TABLET BY MOUTH AT 6 AM AND 2 TABLETS AT 8 PM 270 tablet 1   PREDNISONE PO Take 10 mg by mouth daily.     TEGRETOL-XR 100 MG 12 hr tablet Take 500 mg by mouth daily. Takes 2 tabs at night  1   VENTOLIN HFA 108 (90 Base) MCG/ACT inhaler SMARTSIG:1 Puff(s) Via Inhaler Every 4 Hours PRN     No facility-administered medications prior to visit.   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 [place] 03/2011   chronic sinusitis  08/08/2012   Colon polyp    Constipation    COPD (chronic obstructive pulmonary disease) ([place])    Cystocele 09/16/2012   GERD (gastroesophageal reflux disease)    Hyperlipidemia    Hypertension    Hypothyroid    Neurogenic bladder 2013   husband caths pt, manages with timed void   Osteoporosis    Pulmonary embolism ([place])    1980s   Raynaud phenomenon    Renal cyst    Non-complex, contrast MRI 2013 with L>R non-complex cysts, dominant 3.7 left lower pole   Seizures ([place])    1990 last seizures on meds Phenobarb   Shingles late 90's   Skin cancer    squamous cell   Thyroid nodule    Right thyroid lobe, only seen on Sagittal imaging measures 2.3 cm in craniocaudal dimension and appears stable   Tubular adenoma    Vitamin D deficiency    Past Surgical History:  Procedure Laterality Date   ABDOMINAL  HYSTERECTOMY     ANTERIOR FUSION CLIVUS-C2 EXTRAORAL W/ ODONTOID EXCISION  12/2012   APPENDECTOMY     ARTHROSCOPIC REPAIR ACL Left 1976   BASAL CELL CARCINOMA EXCISION     face   BLADDER SUSPENSION     BREAST DUCTAL SYSTEM EXCISION Right 01/15/2014   Procedure: EXCISION DUCTAL SYSTEM RIGHT BREAST;  Surgeon: Stark Klein, MD;  Location: [place] [facility];  Service: General;  Laterality: Right;   BREAST EXCISIONAL BIOPSY Right    CARPAL TUNNEL RELEASE Bilateral    B, mult times   CATARACT EXTRACTION     bilateral   cervical neck ablation     x 7, C3-C6/3 screws and plate   CESAREAN SECTION     CYSTOCELE REPAIR     DILATION AND CURETTAGE OF UTERUS     duptyren's contracture right hand     FINGER ARTHRODESIS Right 05/06/2015   Procedure: RIGHT RING PROXIMAL INTERPHALANGEAL FUSION (PIP);  Surgeon: Leanora Cover, MD;  Location: [facility];  Service: Orthopedics;  Laterality: Right;   FINGER GANGLION CYST EXCISION     right   HEMICOLECTOMY Right    JOINT REPLACEMENT     right and left basal joints of thumbs   left finger fusion     3 fingers on left hand/one finger right   left ovary and tube removed     NASAL POLYP SURGERY     4 sinus surgeries   NASAL SEPTUM SURGERY     PANNICULECTOMY     PROXIMAL INTERPHALANGEAL FUSION (PIP) Left 09/09/2013   Procedure: FUSION LEFT INDEX PROXIMAL INTERPHALANGEAL JOINT (PIP);  Surgeon: Cammie Sickle., MD;  [facility];  Service: Orthopedics;  Laterality: Left;   right median nerve decompression     SHOULDER ARTHROSCOPY     x2 left, 1 right   sinus surgeries     x 4   squamous lesions removed     neck and face   STERIOD INJECTION Right 05/06/2015   Procedure: STEROID INJECTION;  Surgeon: Leanora Cover, MD;  Location: [facility];  Service: Orthopedics;  Laterality: Right;  Right Index Finger Proximal InterPhalangeal Joint Injection   TONSILLECTOMY AND ADENOIDECTOMY     TUBAL  LIGATION     vocal polyps removed     Allergies  Allergen Reactions   Iodinated Contrast Media Anaphylaxis    Cardiac arrest   Iodine Other (See Comments)    Cardiac arrest As young adult, received iodinated contrast for  IVP, went into cardiac arrest, was resuscitated and hospitalized. Has not received iodinated contrast since that episode. Reports no adverse reaction to betadine.    Morphine Other (See Comments)    Cardiac arrest Patient has previously tolerated hydrocodone, hydromorphone and fentanyl    Morphine And Related Other (See Comments)    Cardiac arrest   Cocklebur    Lambs Quarters    Metrizamide Other (See Comments)    Cardiac arrest Cardiac arrest    Mugwort    Sheep Sorrel    Spiny Pigweed (Amaranthus Spinosus) Skin Test       Objective:    Physical Exam Vitals and nursing note reviewed.  Constitutional:      Appearance: Normal appearance.  Cardiovascular:     Rate and Rhythm: Normal rate and regular rhythm.  Pulmonary:     Effort: Pulmonary effort is normal.     Breath sounds: Normal breath sounds.  Musculoskeletal:     Right hip: Tenderness present. Decreased range of motion.  Skin:    General: Skin is warm and dry.  Neurological:     Mental Status: She is alert and oriented to person, place, and time.     Sensory: Sensation is intact.     Motor: Weakness present.     Gait: Gait abnormal (uses walker).  Psychiatric:        Mood and Affect: Mood normal.        Behavior: Behavior normal.    BP (!) 140/73 (BP Location: Left Arm, Patient Position: Sitting, Cuff Size: Large)   Pulse 79   Temp 98.2 F (36.8 C) (Temporal)   Ht '5\' 2"'$  (1.575 m)   Wt 130 lb 4 oz (59.1 kg)   SpO2 96%   BMI 23.82 kg/m  Wt Readings from Last 3 Encounters:  07/26/22 130 lb 4 oz (59.1 kg)  06/29/22 129 lb (58.5 kg)  12/27/21 127 lb 4 oz (57.7 kg)       Jeanie Sewer, NP

## 2022-07-28 ENCOUNTER — Telehealth: Payer: Self-pay

## 2022-07-28 NOTE — Telephone Encounter (Signed)
     Patient  visit on 2/26  at [facility]   Have you been able to follow up with your primary care physician? Yes   The patient was or was not able to obtain any needed medicine or equipment. Yes   Are there diet recommendations that you are having difficulty following? Na   Patient expresses understanding of discharge instructions and education provided has no other needs at this time.  Yes      [facility] [phone] [address], [place], [place] [address] Phone: [phone] Email: [email or web address]

## 2022-08-01 ENCOUNTER — Ambulatory Visit
Admission: RE | Admit: 2022-08-01 | Discharge: 2022-08-01 | Disposition: A | Discharge status: Home / Self Care | Payer: Medicare Other | Source: Ambulatory Visit | Attending: Family Medicine | Admitting: Family Medicine

## 2022-08-01 DIAGNOSIS — Z1231 Encounter for screening mammogram for malignant neoplasm of breast: Secondary | ICD-10-CM | POA: Diagnosis not present

## 2022-08-03 DIAGNOSIS — J301 Allergic rhinitis due to pollen: Secondary | ICD-10-CM | POA: Diagnosis not present

## 2022-08-03 LAB — HM MAMMOGRAPHY

## 2022-08-04 ENCOUNTER — Encounter: Payer: Self-pay | Admitting: Family Medicine

## 2022-08-10 DIAGNOSIS — J301 Allergic rhinitis due to pollen: Secondary | ICD-10-CM | POA: Diagnosis not present

## 2022-09-04 ENCOUNTER — Other Ambulatory Visit: Payer: Self-pay | Admitting: Family Medicine

## 2022-09-04 DIAGNOSIS — M810 Age-related osteoporosis without current pathological fracture: Secondary | ICD-10-CM

## 2022-09-04 DIAGNOSIS — M8580 Other specified disorders of bone density and structure, unspecified site: Secondary | ICD-10-CM

## 2022-09-04 DIAGNOSIS — N951 Menopausal and female climacteric states: Secondary | ICD-10-CM

## 2022-09-08 DIAGNOSIS — J449 Chronic obstructive pulmonary disease, unspecified: Secondary | ICD-10-CM | POA: Diagnosis not present

## 2022-11-09 DIAGNOSIS — J301 Allergic rhinitis due to pollen: Secondary | ICD-10-CM | POA: Diagnosis not present

## 2022-11-15 DIAGNOSIS — J301 Allergic rhinitis due to pollen: Secondary | ICD-10-CM | POA: Diagnosis not present

## 2022-11-16 ENCOUNTER — Telehealth: Payer: Medicare Other

## 2022-11-16 VITALS — Wt 127.0 lb

## 2022-11-16 DIAGNOSIS — Z0000 Encounter for general adult medical examination without abnormal findings: Secondary | ICD-10-CM | POA: Diagnosis not present

## 2022-11-16 NOTE — Telephone Encounter (Signed)
Subjective:   [name] is a 89 y.o. female who presents for Medicare Annual (Subsequent) preventive examination.  Visit Complete: Virtual  I connected with  [name] on 11/16/22 by a audio enabled telemedicine application and verified that I am speaking with the correct person using two identifiers.  Patient Location: Home  Provider Location: Office/Clinic  I discussed the limitations of evaluation and management by telemedicine. The patient expressed understanding and agreed to proceed.  Review of Systems     Cardiac Risk Factors include: advanced age (>71men, >25 women);dyslipidemia;hypertension     Objective:    Today's Vitals   11/16/22 1332  Weight: 127 lb (57.6 kg)   Body mass index is 23.23 kg/m.     11/16/2022    1:40 PM 07/24/2022    6:12 AM 09/22/2021   10:49 AM 05/30/2021    8:11 AM 08/14/2019   10:21 AM 10/14/2018    6:31 PM 12/03/2017   11:03 AM  Advanced Directives  Does Patient Have a Medical Advance Directive? Yes No Yes No;Yes Yes Yes Yes  Type of Estate agent of [place];Living will  Healthcare Power of [employer] of [place];Living will Living will Living will Healthcare Power of [place];Living will  Does patient want to make changes to medical advance directive? No - Patient declined   No - Guardian declined No - Patient declined    Copy of Healthcare Power of Attorney in Chart? Yes - validated most recent copy scanned in chart (See row information)  Yes - validated most recent copy scanned in chart (See row information) No - copy requested   No - copy requested  Would patient like information on creating a medical advance directive?  No - Patient declined  No - Patient declined       Current Medications (verified) Outpatient Encounter Medications as of 11/16/2022  Medication Sig   albuterol (PROVENTIL) (2.5 MG/3ML) 0.083% nebulizer solution USE 1 VIAL IN NEBULIZER 4 TIMES DAILY   amLODipine  (NORVASC) 10 MG tablet TAKE 1 TABLET(10 MG) BY MOUTH DAILY   arformoterol (BROVANA) 15 MCG/2ML NEBU USE 1 VIAL IN NEBULIZER TWICE DAILY   atorvastatin (LIPITOR) 40 MG tablet TAKE 1 TABLET BY MOUTH EVERY NIGHT AT BEDTIME   budesonide (PULMICORT) 0.25 MG/2ML nebulizer solution Take by nebulization 2 (two) times daily.   Calcium Carb-Cholecalciferol 500-125 MG-UNIT TABS Take 1,200 mg by mouth.   Cholecalciferol 25 MCG (1000 UT) capsule Take by mouth.   gabapentin (NEURONTIN) 300 MG capsule Take 300 mg by mouth at bedtime.   levothyroxine (SYNTHROID) 50 MCG tablet TAKE 1 TABLET(50 MCG) BY MOUTH DAILY BEFORE BREAKFAST   methocarbamol (ROBAXIN) 500 MG tablet Take 1 tablet (500 mg total) by mouth every 6 (six) hours as needed for muscle spasms.   montelukast (SINGULAIR) 10 MG tablet TAKE 1 TABLET(10 MG) BY MOUTH AT BEDTIME   PHENobarbital (LUMINAL) 32.4 MG tablet TAKE 1 TABLET BY MOUTH AT 6 AM AND 2 TABLETS AT 8 PM   PREDNISONE PO Take 10 mg by mouth daily.   TEGRETOL-XR 100 MG 12 hr tablet Take 500 mg by mouth daily. Takes 2 tabs at night   VENTOLIN HFA 108 (90 Base) MCG/ACT inhaler SMARTSIG:1 Puff(s) Via Inhaler Every 4 Hours PRN   [DISCONTINUED] mupirocin ointment (BACTROBAN) 2 % ADD ONE INCH OF OINTMENT TO 8 OUNCES OF SINUS RINSE BOTTLE DAILY   [DISCONTINUED] oxyCODONE (OXY IR/ROXICODONE) 5 MG immediate release tablet Take 0.5-1 tablets (2.5-5 mg total) by  mouth every 6 (six) hours as needed for severe pain.   No facility-administered encounter medications on file as of 11/16/2022.    Allergies (verified) Iodinated contrast media, Iodine, Morphine, Morphine and codeine, Cocklebur, Lambs quarters, Metrizamide, Mugwort, Sheep sorrel, and Spiny pigweed (amaranthus spinosus) skin test   History: Past Medical History:  Diagnosis Date   Allergic rhinitis    Anemia    Arthritis    Asthma -COPD    Blood transfusion 1957   Breast mass    right breast in milk duct, bx neg   Chronic fatigue  syndrome    Chronic inflammatory demyelinating polyneuropathy (HCC) 03/2011   chronic sinusitis 08/08/2012   Colon polyp    Constipation    COPD (chronic obstructive pulmonary disease) (HCC)    Cystocele 09/16/2012   GERD (gastroesophageal reflux disease)    Hyperlipidemia    Hypertension    Hypothyroid    Neurogenic bladder 2013   husband caths pt, manages with timed void   Osteoporosis    Pulmonary embolism (HCC)    1980s   Raynaud phenomenon    Renal cyst    Non-complex, contrast MRI 2013 with L>R non-complex cysts, dominant 3.7 left lower pole   Seizures (HCC)    1990 last seizures on meds Phenobarb   Shingles late 90's   Skin cancer    squamous cell   Thyroid nodule    Right thyroid lobe, only seen on Sagittal imaging measures 2.3 cm in craniocaudal dimension and appears stable   Tubular adenoma    Vitamin D deficiency    Past Surgical History:  Procedure Laterality Date   ABDOMINAL HYSTERECTOMY     ANTERIOR FUSION CLIVUS-C2 EXTRAORAL W/ ODONTOID EXCISION  12/2012   APPENDECTOMY     ARTHROSCOPIC REPAIR ACL Left 1976   BASAL CELL CARCINOMA EXCISION     face   BLADDER SUSPENSION     BREAST DUCTAL SYSTEM EXCISION Right 01/15/2014   Procedure: EXCISION DUCTAL SYSTEM RIGHT BREAST;  Surgeon: Almond Lint, MD;  Location: [facility];  Service: General;  Laterality: Right;   BREAST EXCISIONAL BIOPSY Right    CARPAL TUNNEL RELEASE Bilateral    B, mult times   CATARACT EXTRACTION     bilateral   cervical neck ablation     x 7, C3-C6/3 screws and plate   CESAREAN SECTION     CYSTOCELE REPAIR     DILATION AND CURETTAGE OF UTERUS     duptyren's contracture right hand     FINGER ARTHRODESIS Right 05/06/2015   Procedure: RIGHT RING PROXIMAL INTERPHALANGEAL FUSION (PIP);  Surgeon: Betha Loa, MD;  Location: [facility];  Service: Orthopedics;  Laterality: Right;   FINGER GANGLION CYST EXCISION     right   HEMICOLECTOMY Right    JOINT REPLACEMENT      right and left basal joints of thumbs   left finger fusion     3 fingers on left hand/one finger right   left ovary and tube removed     NASAL POLYP SURGERY     4 sinus surgeries   NASAL SEPTUM SURGERY     PANNICULECTOMY     PROXIMAL INTERPHALANGEAL FUSION (PIP) Left 09/09/2013   Procedure: FUSION LEFT INDEX PROXIMAL INTERPHALANGEAL JOINT (PIP);  Surgeon: Wyn Forster., MD;  Location: [facility];  Service: Orthopedics;  Laterality: Left;   right median nerve decompression     SHOULDER ARTHROSCOPY     x2 left, 1 right  sinus surgeries     x 4   squamous lesions removed     neck and face   STERIOD INJECTION Right 05/06/2015   Procedure: STEROID INJECTION;  Surgeon: Betha Loa, MD;  Location: [facility];  Service: Orthopedics;  Laterality: Right;  Right Index Finger Proximal InterPhalangeal Joint Injection   TONSILLECTOMY AND ADENOIDECTOMY     TUBAL LIGATION     vocal polyps removed     Family History  Problem Relation Age of Onset   Heart disease Mother    Hyperlipidemia Mother        M, aunt   Ovarian cancer Sister    Lung cancer Sister        lung - stage 1   Thyroid cancer Sister    Colon cancer Father        47   Malignant hyperthermia Cousin    Stomach cancer Other        first cousin   Skin cancer Daughter    Breast cancer Neg Hx    Esophageal cancer Neg Hx    Pancreatic cancer Neg Hx    Social History   Socioeconomic History   Marital status: Married    Spouse name: Not on file   Number of children: 3   Years of education: Not on file   Highest education level: Not on file  Occupational History   Occupation: retired, Education administrator: RETIRED  Tobacco Use   Smoking status: Former    Packs/day: 0.50    Years: 3.00    Additional pack years: 0.00    Total pack years: 1.50    Types: Cigarettes    Quit date: 05/29/1978    Years since quitting: 44.4   Smokeless tobacco: Never   Tobacco comments:    quit  smoking 35 years ago  Vaping Use   Vaping Use: Never used  Substance and Sexual Activity   Alcohol use: No   Drug use: No   Sexual activity: Yes  Other Topics Concern   Not on file  Social History Narrative   Pt lives at home with her husband. She is originally from [place]. Moved to [place] in 2005.     3 grown children - Son is a Education officer, community in the area, son in [place], daughter in [place]   Pt is a retired Engineer, civil (consulting)   Social Determinants of Corporate investment banker Strain: Low Risk  (11/16/2022)   Overall Financial Resource Strain (CARDIA)    Difficulty of Paying Living Expenses: Not hard at all  Food Insecurity: No Food Insecurity (11/16/2022)   Hunger Vital Sign    Worried About Running Out of Food in the Last Year: Never true    Ran Out of Food in the Last Year: Never true  Transportation Needs: No Transportation Needs (11/16/2022)   PRAPARE - Administrator, Civil Service (Medical): No    Lack of Transportation (Non-Medical): No  Physical Activity: Inactive (11/16/2022)   Exercise Vital Sign    Days of Exercise per Week: 0 days    Minutes of Exercise per Session: 0 min  Stress: No Stress Concern Present (11/16/2022)   [employer] of Occupational Health - Occupational Stress Questionnaire    Feeling of Stress : Not at all  Social Connections: Socially Integrated (11/16/2022)   Social Connection and Isolation Panel [NHANES]    Frequency of Communication with Friends and Family: More than three times a week  Frequency of Social Gatherings with Friends and Family: More than three times a week    Attends Religious Services: 1 to 4 times per year    Active Member of [employer] or Organizations: Yes    Attends Banker Meetings: 1 to 4 times per year    Marital Status: Married    Tobacco Counseling Counseling given: Not Answered Tobacco comments: quit smoking 35 years ago   Clinical Intake:  Pre-visit preparation completed: Yes  Pain : No/denies  pain     BMI - recorded: 23.23 Nutritional Status: BMI of 19-24  Normal Nutritional Risks: None Diabetes: No  How often do you need to have someone help you when you read instructions, pamphlets, or other written materials from your doctor or pharmacy?: 1 - Never  Interpreter Needed?: No  Information entered by :: Lanier Ensign, LPN   Activities of Daily Living    11/16/2022    1:41 PM  In your present state of health, do you have any difficulty performing the following activities:  Hearing? 1  Comment hearing aids  Vision? 0  Difficulty concentrating or making decisions? 0  Walking or climbing stairs? 1  Comment unable to walk  Dressing or bathing? 1  Comment husband assist  Doing errands, shopping? 0  Preparing Food and eating ? Y  Comment husband  Using the Toilet? N  In the past six months, have you accidently leaked urine? N  Do you have problems with loss of bowel control? N  Managing your Medications? N  Managing your Finances? N  Housekeeping or managing your Housekeeping? N    Patient Care Team: Jeani Sow, MD as PCP - General (Family Medicine) Pyrtle, Carie Caddy, MD as Consulting Physician (Gastroenterology) Bettey Mare, MD as Consulting Physician (Neurosurgery) Berneice Heinrich Delbert Phenix., MD as Consulting Physician (Urology) Bethanie Dicker, MD as Consulting Physician (Pulmonary Disease) Carlus Pavlov, MD as Consulting Physician (Endocrinology) Betha Loa, MD as Consulting Physician (Orthopedic Surgery) Hobson-Webb, Remonia Richter, MD as Referring Physician (Neurology) Laurine Blazer, MD as Referring Physician (Dermatology) Durenda Age, MD as Referring Physician (Rheumatology)  Indicate any recent Medical Services you may have received from other than [facility] providers in the past year (date may be approximate).     Assessment:   This is a routine wellness examination for [name].  Hearing/Vision screen Hearing Screening - Comments:: Pt has  hearing aids  Vision Screening - Comments:: Pt follows up with Dr [facility] for annual eye exams   Dietary issues and exercise activities discussed:     Goals Addressed             This Visit's Progress    Patient Stated       None at this time        Depression Screen    11/16/2022    1:38 PM 06/29/2022    9:44 AM 09/22/2021   10:48 AM 02/17/2021    3:29 PM 02/17/2021    1:29 PM 12/03/2019    1:35 PM 10/31/2019   12:02 PM  PHQ 2/9 Scores  PHQ - 2 Score 0 0 0 0 0 0 0  PHQ- 9 Score    0  2 0    Fall Risk    11/16/2022    1:41 PM 06/29/2022    9:44 AM 12/27/2021    9:05 AM 09/22/2021   10:50 AM 02/17/2021    1:29 PM  Fall Risk   Falls in the past year? 0 1 1  1 1  Number falls in past yr: 0 0 0 1   Injury with Fall? 0 0 1 1 1   Comment    elbow injury fractured finger on left hand  Risk for fall due to : Impaired mobility;Impaired balance/gait  Impaired mobility;Impaired balance/gait Impaired vision;Impaired balance/gait;Impaired mobility   Follow up Falls prevention discussed Falls evaluation completed Education provided;Falls prevention discussed Falls prevention discussed     MEDICARE RISK AT HOME:  Medicare Risk at Home - 11/16/22 1339     Any stairs in or around the home? No    If so, are there any without handrails? No    Home free of loose throw rugs in walkways, pet beds, electrical cords, etc? Yes    Adequate lighting in your home to reduce risk of falls? Yes    Life alert? No    Use of a cane, walker or w/c? Yes    Grab bars in the bathroom? Yes    Shower chair or bench in shower? No    Elevated toilet seat or a handicapped toilet? Yes             TIMED UP AND GO:  Was the test performed?  No    Cognitive Function:        11/16/2022    1:42 PM 09/22/2021   10:52 AM  6CIT Screen  What Year? 0 points 0 points  What month? 0 points 0 points  What time? 0 points 0 points  Count back from 20 0 points 0 points  Months in reverse 0 points 0 points   Repeat phrase 0 points 0 points  Total Score 0 points 0 points    Immunizations Immunization History  Administered Date(s) Administered   Fluad Quad(high Dose 65+) 03/13/2019, 04/13/2022   Influenza Split 12/27/2012   Influenza Whole 01/28/2011   Influenza,inj,Quad PF,6+ Mos 03/16/2014, 02/17/2015   Influenza-Unspecified 02/12/2012, 02/05/2015, 03/24/2018, 03/02/2020   PFIZER(Purple Top)SARS-COV-2 Vaccination 06/17/2019, 07/08/2019, 03/02/2020, 09/07/2020   Pneumococcal Conjugate-13 12/11/2013   Pneumococcal Polysaccharide-23 05/29/2006   Tdap 04/11/2011, 10/14/2018   Zoster Recombinat (Shingrix) 05/01/2019, 07/30/2019   Zoster, Live 04/14/2014    TDAP status: Up to date  Flu Vaccine status: Up to date  Pneumococcal vaccine status: Up to date  Covid-19 vaccine status: Completed vaccines  Qualifies for Shingles Vaccine? Yes   Zostavax completed Yes   Shingrix Completed?: Yes  Screening Tests Health Maintenance  Topic Date Due   INFLUENZA VACCINE  12/28/2022   Medicare Annual Wellness (AWV)  11/16/2023   DTaP/Tdap/Td (3 - Td or Tdap) 10/13/2028   Pneumonia Vaccine 61+ Years old  Completed   DEXA SCAN  Completed   Zoster Vaccines- Shingrix  Completed   HPV VACCINES  Aged Out   COVID-19 Vaccine  Discontinued    Health Maintenance  There are no preventive care reminders to display for this patient.  Colorectal cancer screening: No longer required.   Mammogram status: Completed 08/03/22. Repeat every year  Bone Density status: Completed 03/04/20. Results reflect: Bone density results: OSTEOPENIA. Repeat every scheduled 03/20/23 years.   Additional Screening:   Vision Screening: Recommended annual ophthalmology exams for early detection of glaucoma and other disorders of the eye. Is the patient up to date with their annual eye exam?  Yes  Who is the provider or what is the name of the office in which the patient attends annual eye exams? [facility]  If pt is not  established with a provider, would they like to  be referred to a provider to establish care? No .   Dental Screening: Recommended annual dental exams for proper oral hygiene   Community Resource Referral / Chronic Care Management: CRR required this visit?  No   CCM required this visit?  No     Plan:     I have personally reviewed and noted the following in the patient's chart:   Medical and social history Use of alcohol, tobacco or illicit drugs  Current medications and supplements including opioid prescriptions. Patient is not currently taking opioid prescriptions. Functional ability and status Nutritional status Physical activity Advanced directives List of other physicians Hospitalizations, surgeries, and ER visits in previous 12 months Vitals Screenings to include cognitive, depression, and falls Referrals and appointments  In addition, I have reviewed and discussed with patient certain preventive protocols, quality metrics, and best practice recommendations. A written personalized care plan for preventive services as well as general preventive health recommendations were provided to patient.     Marzella Schlein, LPN   [id number]   After Visit Summary: (MyChart) Due to this being a telephonic visit, the after visit summary with patients personalized plan was offered to patient via MyChart   Nurse Notes: none

## 2022-11-16 NOTE — Patient Instructions (Signed)
[name] , Thank you for taking time to come for your Medicare Wellness Visit. I appreciate your ongoing commitment to your health goals. Please review the following plan we discussed and let me know if I can assist you in the future.   These are the goals we discussed:  Goals      Patient Stated     Continue walking     Patient Stated     None at this time     Patient Stated     None at this time         This is a list of the screening recommended for you and due dates:  Health Maintenance  Topic Date Due   Flu Shot  12/28/2022   Medicare Annual Wellness Visit  11/16/2023   DTaP/Tdap/Td vaccine (3 - Td or Tdap) 10/13/2028   Pneumonia Vaccine  Completed   DEXA scan (bone density measurement)  Completed   Zoster (Shingles) Vaccine  Completed   HPV Vaccine  Aged Out   COVID-19 Vaccine  Discontinued    Advanced directives: copies in chart   Conditions/risks identified: none at this time   Next appointment: Follow up in one year for your annual wellness visit    Preventive Care 65 Years and Older, Female Preventive care refers to lifestyle choices and visits with your health care provider that can promote health and wellness. What does preventive care include? A yearly physical exam. This is also called an annual well check. Dental exams once or twice a year. Routine eye exams. Ask your health care provider how often you should have your eyes checked. Personal lifestyle choices, including: Daily care of your teeth and gums. Regular physical activity. Eating a healthy diet. Avoiding tobacco and drug use. Limiting alcohol use. Practicing safe sex. Taking low-dose aspirin every day. Taking vitamin and mineral supplements as recommended by your health care provider. What happens during an annual well check? The services and screenings done by your health care provider during your annual well check will depend on your age, overall health, lifestyle risk factors, and  family history of disease. Counseling  Your health care provider may ask you questions about your: Alcohol use. Tobacco use. Drug use. Emotional well-being. Home and relationship well-being. Sexual activity. Eating habits. History of falls. Memory and ability to understand (cognition). Work and work Astronomer. Reproductive health. Screening  You may have the following tests or measurements: Height, weight, and BMI. Blood pressure. Lipid and cholesterol levels. These may be checked every 5 years, or more frequently if you are over 47 years old. Skin check. Lung cancer screening. You may have this screening every year starting at age 14 if you have a 30-pack-year history of smoking and currently smoke or have quit within the past 15 years. Fecal occult blood test (FOBT) of the stool. You may have this test every year starting at age 76. Flexible sigmoidoscopy or colonoscopy. You may have a sigmoidoscopy every 5 years or a colonoscopy every 10 years starting at age 68. Hepatitis C blood test. Hepatitis B blood test. Sexually transmitted disease (STD) testing. Diabetes screening. This is done by checking your blood sugar (glucose) after you have not eaten for a while (fasting). You may have this done every 1-3 years. Bone density scan. This is done to screen for osteoporosis. You may have this done starting at age 51. Mammogram. This may be done every 1-2 years. Talk to your health care provider about how often you should have  regular mammograms. Talk with your health care provider about your test results, treatment options, and if necessary, the need for more tests. Vaccines  Your health care provider may recommend certain vaccines, such as: Influenza vaccine. This is recommended every year. Tetanus, diphtheria, and acellular pertussis (Tdap, Td) vaccine. You may need a Td booster every 10 years. Zoster vaccine. You may need this after age 30. Pneumococcal 13-valent conjugate  (PCV13) vaccine. One dose is recommended after age 30. Pneumococcal polysaccharide (PPSV23) vaccine. One dose is recommended after age 20. Talk to your health care provider about which screenings and vaccines you need and how often you need them. This information is not intended to replace advice given to you by your health care provider. Make sure you discuss any questions you have with your health care provider. Document Released: 06/11/2015 Document Revised: 02/02/2016 Document Reviewed: 03/16/2015 Elsevier Interactive Patient Education  2017 [employer].  Fall Prevention in the Home Falls can cause injuries. They can happen to people of all ages. There are many things you can do to make your home safe and to help prevent falls. What can I do on the outside of my home? Regularly fix the edges of walkways and driveways and fix any cracks. Remove anything that might make you trip as you walk through a door, such as a raised step or threshold. Trim any bushes or trees on the path to your home. Use bright outdoor lighting. Clear any walking paths of anything that might make someone trip, such as rocks or tools. Regularly check to see if handrails are loose or broken. Make sure that both sides of any steps have handrails. Any raised decks and porches should have guardrails on the edges. Have any leaves, snow, or ice cleared regularly. Use sand or salt on walking paths during winter. Clean up any spills in your garage right away. This includes oil or grease spills. What can I do in the bathroom? Use night lights. Install grab bars by the toilet and in the tub and shower. Do not use towel bars as grab bars. Use non-skid mats or decals in the tub or shower. If you need to sit down in the shower, use a plastic, non-slip stool. Keep the floor dry. Clean up any water that spills on the floor as soon as it happens. Remove soap buildup in the tub or shower regularly. Attach bath mats securely with  double-sided non-slip rug tape. Do not have throw rugs and other things on the floor that can make you trip. What can I do in the bedroom? Use night lights. Make sure that you have a light by your bed that is easy to reach. Do not use any sheets or blankets that are too big for your bed. They should not hang down onto the floor. Have a firm chair that has side arms. You can use this for support while you get dressed. Do not have throw rugs and other things on the floor that can make you trip. What can I do in the kitchen? Clean up any spills right away. Avoid walking on wet floors. Keep items that you use a lot in easy-to-reach places. If you need to reach something above you, use a strong step stool that has a grab bar. Keep electrical cords out of the way. Do not use floor polish or wax that makes floors slippery. If you must use wax, use non-skid floor wax. Do not have throw rugs and other things on the floor that  can make you trip. What can I do with my stairs? Do not leave any items on the stairs. Make sure that there are handrails on both sides of the stairs and use them. Fix handrails that are broken or loose. Make sure that handrails are as long as the stairways. Check any carpeting to make sure that it is firmly attached to the stairs. Fix any carpet that is loose or worn. Avoid having throw rugs at the top or bottom of the stairs. If you do have throw rugs, attach them to the floor with carpet tape. Make sure that you have a light switch at the top of the stairs and the bottom of the stairs. If you do not have them, ask someone to add them for you. What else can I do to help prevent falls? Wear shoes that: Do not have high heels. Have rubber bottoms. Are comfortable and fit you well. Are closed at the toe. Do not wear sandals. If you use a stepladder: Make sure that it is fully opened. Do not climb a closed stepladder. Make sure that both sides of the stepladder are locked  into place. Ask someone to hold it for you, if possible. Clearly mark and make sure that you can see: Any grab bars or handrails. First and last steps. Where the edge of each step is. Use tools that help you move around (mobility aids) if they are needed. These include: Canes. Walkers. Scooters. Crutches. Turn on the lights when you go into a dark area. Replace any light bulbs as soon as they burn out. Set up your furniture so you have a clear path. Avoid moving your furniture around. If any of your floors are uneven, fix them. If there are any pets around you, be aware of where they are. Review your medicines with your doctor. Some medicines can make you feel dizzy. This can increase your chance of falling. Ask your doctor what other things that you can do to help prevent falls. This information is not intended to replace advice given to you by your health care provider. Make sure you discuss any questions you have with your health care provider. Document Released: 03/11/2009 Document Revised: 10/21/2015 Document Reviewed: 06/19/2014 Elsevier Interactive Patient Education  2017 [employer].

## 2022-12-06 DIAGNOSIS — H1045 Other chronic allergic conjunctivitis: Secondary | ICD-10-CM | POA: Diagnosis not present

## 2022-12-06 DIAGNOSIS — H524 Presbyopia: Secondary | ICD-10-CM | POA: Diagnosis not present

## 2022-12-06 DIAGNOSIS — H40013 Open angle with borderline findings, low risk, bilateral: Secondary | ICD-10-CM | POA: Diagnosis not present

## 2022-12-06 DIAGNOSIS — H04123 Dry eye syndrome of bilateral lacrimal glands: Secondary | ICD-10-CM | POA: Diagnosis not present

## 2022-12-06 DIAGNOSIS — H52223 Regular astigmatism, bilateral: Secondary | ICD-10-CM | POA: Diagnosis not present

## 2022-12-13 ENCOUNTER — Other Ambulatory Visit: Payer: Self-pay | Admitting: Family Medicine

## 2022-12-13 DIAGNOSIS — R569 Unspecified convulsions: Secondary | ICD-10-CM

## 2022-12-22 ENCOUNTER — Other Ambulatory Visit: Payer: Self-pay | Admitting: Family Medicine

## 2023-01-04 ENCOUNTER — Ambulatory Visit: Payer: Medicare Other | Admitting: Family Medicine

## 2023-01-04 DIAGNOSIS — J301 Allergic rhinitis due to pollen: Secondary | ICD-10-CM | POA: Diagnosis not present

## 2023-01-08 DIAGNOSIS — J479 Bronchiectasis, uncomplicated: Secondary | ICD-10-CM | POA: Diagnosis not present

## 2023-01-08 DIAGNOSIS — J449 Chronic obstructive pulmonary disease, unspecified: Secondary | ICD-10-CM | POA: Diagnosis not present

## 2023-01-08 DIAGNOSIS — J301 Allergic rhinitis due to pollen: Secondary | ICD-10-CM | POA: Diagnosis not present

## 2023-01-08 DIAGNOSIS — G6181 Chronic inflammatory demyelinating polyneuritis: Secondary | ICD-10-CM | POA: Diagnosis not present

## 2023-01-16 DIAGNOSIS — M4802 Spinal stenosis, cervical region: Secondary | ICD-10-CM | POA: Diagnosis not present

## 2023-01-16 DIAGNOSIS — Z9109 Other allergy status, other than to drugs and biological substances: Secondary | ICD-10-CM | POA: Diagnosis not present

## 2023-01-16 DIAGNOSIS — G629 Polyneuropathy, unspecified: Secondary | ICD-10-CM | POA: Diagnosis not present

## 2023-01-16 DIAGNOSIS — R252 Cramp and spasm: Secondary | ICD-10-CM | POA: Diagnosis not present

## 2023-01-16 DIAGNOSIS — J3489 Other specified disorders of nose and nasal sinuses: Secondary | ICD-10-CM | POA: Diagnosis not present

## 2023-01-22 ENCOUNTER — Encounter: Payer: Self-pay | Admitting: Family Medicine

## 2023-01-22 ENCOUNTER — Ambulatory Visit: Payer: Medicare Other | Admitting: Family Medicine

## 2023-01-22 VITALS — BP 110/60 | HR 77 | Temp 98.2°F | Resp 16 | Ht 62.0 in | Wt 130.4 lb

## 2023-01-22 DIAGNOSIS — R569 Unspecified convulsions: Secondary | ICD-10-CM

## 2023-01-22 DIAGNOSIS — D582 Other hemoglobinopathies: Secondary | ICD-10-CM | POA: Insufficient documentation

## 2023-01-22 DIAGNOSIS — E038 Other specified hypothyroidism: Secondary | ICD-10-CM | POA: Diagnosis not present

## 2023-01-22 DIAGNOSIS — F329 Major depressive disorder, single episode, unspecified: Secondary | ICD-10-CM | POA: Diagnosis not present

## 2023-01-22 DIAGNOSIS — G6181 Chronic inflammatory demyelinating polyneuritis: Secondary | ICD-10-CM

## 2023-01-22 DIAGNOSIS — E7800 Pure hypercholesterolemia, unspecified: Secondary | ICD-10-CM | POA: Diagnosis not present

## 2023-01-22 DIAGNOSIS — I10 Essential (primary) hypertension: Secondary | ICD-10-CM

## 2023-01-22 DIAGNOSIS — J449 Chronic obstructive pulmonary disease, unspecified: Secondary | ICD-10-CM

## 2023-01-22 MED ORDER — SERTRALINE HCL 25 MG PO TABS: 25.0000 mg | ORAL_TABLET | Freq: Every day | ORAL | 1 refills | Status: DC

## 2023-01-22 MED ORDER — AMLODIPINE BESYLATE 10 MG PO TABS: 10.0000 mg | ORAL_TABLET | Freq: Every day | ORAL | 3 refills | Status: DC

## 2023-01-22 MED ORDER — ATORVASTATIN CALCIUM 40 MG PO TABS: 40.0000 mg | ORAL_TABLET | Freq: Every day | ORAL | 3 refills | Status: DC

## 2023-01-22 MED ORDER — MONTELUKAST SODIUM 10 MG PO TABS: 10.0000 mg | ORAL_TABLET | Freq: Every day | ORAL | 3 refills | Status: DC

## 2023-01-22 NOTE — Assessment & Plan Note (Signed)
Chronic.  Progressive.  Managed by neurology.  Using walker.  Falls a lot.

## 2023-01-22 NOTE — Assessment & Plan Note (Signed)
Chronic.  Controlled.  Continue atorvastatin 40 mg daily 

## 2023-01-22 NOTE — Assessment & Plan Note (Signed)
Newer diagnosis.  Not sure if COPD, other.  Hemoglobin has risen to 16.  Will refer to hematology.  (Has seen Dr. Myna Hidalgo in the past)

## 2023-01-22 NOTE — Assessment & Plan Note (Signed)
Chronic stable.  Continue medications.  Managed by pulmonary

## 2023-01-22 NOTE — Assessment & Plan Note (Signed)
Patient was doing well until more recently.  Unable to stand long enough to cook.  Upset about losing independence.  Will start sertraline 12.5 mg daily for 1 week then 25 mg daily.  Side effects discussed.  Follow-up in 1 month

## 2023-01-22 NOTE — Assessment & Plan Note (Signed)
 Chronic.  Controlled.  Continue amlodipine 10 mg daily

## 2023-01-22 NOTE — Progress Notes (Signed)
Subjective:     Patient ID: [name], female    DOB: [birth year], 89 y.o.   MRN: [id number]  Chief Complaint  Patient presents with   Medical Management of Chronic Issues    6 month follow-up for htn Not fasting    Depression    Having some new depression issues   Patient is accompanied by her Husband,  HPI HTN - Pt is on Norvasc 10 mg.  Bp at today's visit was 110/60-similar to home readings.  No ha/dizziness/cp/palp/edema. Denies seizures.   Depression - Patient states she feels depressed that she can't cook for her family anymore or do daily task the way she used to including bathing. Endorses trouble falling asleep due to anxiety and congestion. No SI.   COPD- States that she has to sleep with the head of the bed elevated or she experiences discomfort. Her feelings of congestion worsen at bedtime. Compliant with Nasonex 50 mcg spray, Brovana 15 mcg, Prednisone 15 mg, and Albuterol 0.083% solution as needed. Her husband states she requires the Albuterol nearly every night out of the week.    Falls -chronic neuropathy.  Using rollator. Husband states that she has fallen multiple times in the past year. If she attempts to go to the bathroom on her own at night, she loses her balance and cannot feel her feet, causing her to trip. Unable to stand to cook.husband can only leave alone for about 1 hr at a time.  Sz disorder.  Seeing neuro at [facility].  Very stable on tegretol and phenobarbitol.  In past, when changed to other agents either SE or breakthru sz.  Hypothyroidism-on synthroid 0.5mg  daily.  No tremors, palp HLD-on atorvastatin 40mg  daily-doing well.    There are no preventive care reminders to display for this patient.   Past Medical History:  Diagnosis Date   Allergic rhinitis    Anemia    Arthritis    Asthma -COPD    Blood transfusion 1957   Breast mass    right breast in milk duct, bx neg   Chronic fatigue syndrome    Chronic inflammatory demyelinating  polyneuropathy (HCC) 03/2011   chronic sinusitis 08/08/2012   Colon polyp    Constipation    COPD (chronic obstructive pulmonary disease) (HCC)    Cystocele 09/16/2012   GERD (gastroesophageal reflux disease)    Hyperlipidemia    Hypertension    Hypothyroid    Neurogenic bladder 2013   husband caths pt, manages with timed void   Osteoporosis    Pulmonary embolism (HCC)    1980s   Raynaud phenomenon    Renal cyst    Non-complex, contrast MRI 2013 with L>R non-complex cysts, dominant 3.7 left lower pole   Seizures (HCC)    1990 last seizures on meds Phenobarb   Shingles late 90's   Skin cancer    squamous cell   Thyroid nodule    Right thyroid lobe, only seen on Sagittal imaging measures 2.3 cm in craniocaudal dimension and appears stable   Tubular adenoma    Vitamin D deficiency     Past Surgical History:  Procedure Laterality Date   ABDOMINAL HYSTERECTOMY     ANTERIOR FUSION CLIVUS-C2 EXTRAORAL W/ ODONTOID EXCISION  12/2012   APPENDECTOMY     ARTHROSCOPIC REPAIR ACL Left 1976   BASAL CELL CARCINOMA EXCISION     face   BLADDER SUSPENSION     BREAST DUCTAL SYSTEM EXCISION Right 01/15/2014   Procedure: EXCISION DUCTAL SYSTEM RIGHT  BREAST;  Surgeon: Almond Lint, MD;  Location: [facility];  Service: General;  Laterality: Right;   BREAST EXCISIONAL BIOPSY Right    CARPAL TUNNEL RELEASE Bilateral    B, mult times   CATARACT EXTRACTION     bilateral   cervical neck ablation     x 7, C3-C6/3 screws and plate   CESAREAN SECTION     CYSTOCELE REPAIR     DILATION AND CURETTAGE OF UTERUS     duptyren's contracture right hand     FINGER ARTHRODESIS Right 05/06/2015   Procedure: RIGHT RING PROXIMAL INTERPHALANGEAL FUSION (PIP);  Surgeon: Betha Loa, MD;  Location: [facility];  Service: Orthopedics;  Laterality: Right;   FINGER GANGLION CYST EXCISION     right   HEMICOLECTOMY Right    JOINT REPLACEMENT     right and left basal joints of thumbs    left finger fusion     3 fingers on left hand/one finger right   left ovary and tube removed     NASAL POLYP SURGERY     4 sinus surgeries   NASAL SEPTUM SURGERY     PANNICULECTOMY     PROXIMAL INTERPHALANGEAL FUSION (PIP) Left 09/09/2013   Procedure: FUSION LEFT INDEX PROXIMAL INTERPHALANGEAL JOINT (PIP);  Surgeon: Wyn Forster., MD;  Location[facility];  Service: Orthopedics;  Laterality: Left;   right median nerve decompression     SHOULDER ARTHROSCOPY     x2 left, 1 right   sinus surgeries     x 4   squamous lesions removed     neck and face   STERIOD INJECTION Right 05/06/2015   Procedure: STEROID INJECTION;  Surgeon: Betha Loa, MD;  Location: [facility];  Service: Orthopedics;  Laterality: Right;  Right Index Finger Proximal InterPhalangeal Joint Injection   TONSILLECTOMY AND ADENOIDECTOMY     TUBAL LIGATION     vocal polyps removed       Current Outpatient Medications:    albuterol (PROVENTIL) (2.5 MG/3ML) 0.083% nebulizer solution, USE 1 VIAL IN NEBULIZER 4 TIMES DAILY, Disp: , Rfl:    arformoterol (BROVANA) 15 MCG/2ML NEBU, USE 1 VIAL IN NEBULIZER TWICE DAILY, Disp: , Rfl:    B-D TB SYRINGE 1CC/27GX1/2" 27G X 1/2" 1 ML MISC, USE 2 APPLICATIONS ROUTE ONCE A WEEK., Disp: , Rfl:    budesonide (PULMICORT) 0.25 MG/2ML nebulizer solution, Take by nebulization 2 (two) times daily., Disp: , Rfl:    CALCIUM PO, Take 600 mg by mouth daily., Disp: , Rfl:    carbamazepine (TEGRETOL XR) 100 MG 12 hr tablet, Take by mouth., Disp: , Rfl:    Cholecalciferol 25 MCG (1000 UT) capsule, Take by mouth., Disp: , Rfl:    Cyanocobalamin (B-12 PO), Take by mouth daily., Disp: , Rfl:    gabapentin (NEURONTIN) 300 MG capsule, Take 300 mg by mouth at bedtime., Disp: , Rfl:    levothyroxine (SYNTHROID) 50 MCG tablet, TAKE 1 TABLET(50 MCG) BY MOUTH DAILY BEFORE BREAKFAST, Disp: 90 tablet, Rfl: 3   methocarbamol (ROBAXIN) 500 MG tablet, Take 1 tablet (500 mg  total) by mouth every 6 (six) hours as needed for muscle spasms., Disp: 20 tablet, Rfl: 0   PHENobarbital (LUMINAL) 32.4 MG tablet, TAKE 1 TABLET BY MOUTH AT 6AM AND 2 TABLETS BY MOUTH AT 8 PM, Disp: 270 tablet, Rfl: 1   predniSONE (DELTASONE) 5 MG tablet, Take 15 mg by mouth daily., Disp: , Rfl:  sertraline (ZOLOFT) 25 MG tablet, Take 1 tablet (25 mg total) by mouth daily., Disp: 30 tablet, Rfl: 1   VENTOLIN HFA 108 (90 Base) MCG/ACT inhaler, SMARTSIG:1 Puff(s) Via Inhaler Every 4 Hours PRN, Disp: , Rfl:    amLODipine (NORVASC) 10 MG tablet, Take 1 tablet (10 mg total) by mouth daily., Disp: 90 tablet, Rfl: 3   atorvastatin (LIPITOR) 40 MG tablet, Take 1 tablet (40 mg total) by mouth at bedtime., Disp: 90 tablet, Rfl: 3   montelukast (SINGULAIR) 10 MG tablet, Take 1 tablet (10 mg total) by mouth at bedtime., Disp: 90 tablet, Rfl: 3  Allergies  Allergen Reactions   Iodinated Contrast Media Anaphylaxis    Cardiac arrest   Iodine Other (See Comments)    Cardiac arrest As young adult, received iodinated contrast for IVP, went into cardiac arrest, was resuscitated and hospitalized. Has not received iodinated contrast since that episode. Reports no adverse reaction to betadine.    Morphine Other (See Comments)    Cardiac arrest Patient has previously tolerated hydrocodone, hydromorphone and fentanyl    Morphine And Codeine Other (See Comments)    Cardiac arrest   Cocklebur    Lambs Quarters    Metrizamide Other (See Comments)    Cardiac arrest Cardiac arrest    Mugwort    Sheep Sorrel    Spiny Pigweed (Amaranthus Spinosus) Skin Test    ROS neg/noncontributory except as noted HPI/below      Objective:     BP 110/60   Pulse 77   Temp 98.2 F (36.8 C) (Temporal)   Resp 16   Ht 5\' 2"  (1.575 m)   Wt 130 lb 6 oz (59.1 kg)   SpO2 96%   BMI 23.85 kg/m  Wt Readings from Last 3 Encounters:  01/22/23 130 lb 6 oz (59.1 kg)  11/16/22 127 lb (57.6 kg)  07/26/22 130 lb 4 oz (59.1  kg)    Physical Exam   Gen: WDWN NAD HEENT: NCAT, conjunctiva not injected, sclera nonicteric NECK:  supple, no thyromegaly, no nodes, no carotid bruits CARDIAC: RRR, S1S2+, no murmur. DP 2+B LUNGS: CTAB. No wheezes ABDOMEN:  BS+, soft, NTND, No HSM, no masses EXT:  no edema MSK: rollator.  Husband assists to stand NEURO: A&O x3.  CN II-XII intact.  PSYCH: normal mood. Good eye contact  Reviewed Labs.      Assessment & Plan:  Essential hypertension -     amLODIPine Besylate; Take 1 tablet (10 mg total) by mouth daily.  Dispense: 90 tablet; Refill: 3  Pure hypercholesterolemia Assessment & Plan: Chronic.  Controlled.  Continue atorvastatin 40 mg daily   Other specified hypothyroidism Assessment & Plan: Chronic.  Controlled.  Continue Synthroid 0.05 mg daily   Seizures (HCC) -     AMB Referral to [facility] ([facility] Patients)  Elevated hemoglobin (HCC) Assessment & Plan: Newer diagnosis.  Not sure if COPD, other.  Hemoglobin has risen to 16.  Will refer to hematology.  (Has seen Dr. Myna Hidalgo in the past)  Orders: -     Ambulatory referral to Hematology / Oncology  Chronic inflammatory demyelinating neuropathy [facility]) Assessment & Plan: Chronic.  Progressive.  Managed by neurology.  Using walker.  Falls a lot.  Orders: -     AMB Referral to [facility] ([facility])  Chronic obstructive pulmonary disease, unspecified COPD type (HCC) Assessment & Plan: Chronic stable.  Continue medications.  Managed by pulmonary  Orders: -     AMB Referral  to [facility] ([facility] Patients)  Reactive depression Assessment & Plan: Patient was doing well until more recently.  Unable to stand long enough to cook.  Upset about losing independence.  Will start sertraline 12.5 mg daily for 1 week then 25 mg daily.  Side effects discussed.  Follow-up in 1 month  Orders: -     AMB Referral to [facility] Coordinaton ([facility] Patients)  Primary  hypertension Assessment & Plan: Chronic.  Controlled.  Continue amlodipine 10 mg daily   Other orders -     Atorvastatin Calcium; Take 1 tablet (40 mg total) by mouth at bedtime.  Dispense: 90 tablet; Refill: 3 -     Montelukast Sodium; Take 1 tablet (10 mg total) by mouth at bedtime.  Dispense: 90 tablet; Refill: 3 -     Sertraline HCl; Take 1 tablet (25 mg total) by mouth daily.  Dispense: 30 tablet; Refill: 1    Return in about 6 months (around 07/25/2023) for HTN-6 mo.   1 mo mood-me.   Germaine Pomfret Rice,acting as a scribe for Angelena Sole, MD.,have documented all relevant documentation on the behalf of Angelena Sole, MD,as directed by  Angelena Sole, MD while in the presence of Angelena Sole, MD.  I, Angelena Sole, MD, have reviewed all documentation for this visit. The documentation on 01/22/23 for the exam, diagnosis, procedures, and orders are all accurate and complete.    Angelena Sole, MD

## 2023-01-22 NOTE — Assessment & Plan Note (Signed)
Chronic.  Controlled.  Continue Synthroid 0.05 mg daily

## 2023-01-22 NOTE — Patient Instructions (Signed)
Zoloft-1/2 tab daily for 1 week, then daily.

## 2023-01-23 ENCOUNTER — Telehealth: Payer: Self-pay | Admitting: *Deleted

## 2023-01-23 DIAGNOSIS — D1801 Hemangioma of skin and subcutaneous tissue: Secondary | ICD-10-CM | POA: Diagnosis not present

## 2023-01-23 DIAGNOSIS — L820 Inflamed seborrheic keratosis: Secondary | ICD-10-CM | POA: Diagnosis not present

## 2023-01-23 DIAGNOSIS — L821 Other seborrheic keratosis: Secondary | ICD-10-CM | POA: Diagnosis not present

## 2023-01-23 NOTE — Progress Notes (Unsigned)
  Care Coordination  Outreach Note  01/23/2023 Name: Stephanye Scogin MRN: [id number] DOB: December 31, 1932   Care Coordination Outreach Attempts: An unsuccessful telephone outreach was attempted today to offer the patient information about available care coordination services.  Follow Up Plan:  Additional outreach attempts will be made to offer the patient care coordination information and services.   Encounter Outcome:  No Answer  Burman Nieves, CCMA Care Coordination Care Guide Direct Dial: [phone]

## 2023-01-24 NOTE — Progress Notes (Deleted)
  Care Coordination  Outreach Note  01/24/2023 Name: [name] MRN: [id number] DOB: Apr 07, 1933   Care Coordination Outreach Attempts: A second unsuccessful outreach was attempted today to offer the patient with information about available care coordination services.  Follow Up Plan:  Additional outreach attempts will be made to offer the patient care coordination information and services.   Encounter Outcome:  No Answer  Burman Nieves, CCMA Care Coordination Care Guide Direct Dial: [phone]

## 2023-01-24 NOTE — Progress Notes (Signed)
  Care Coordination   Note   01/24/2023 Name: [name] MRN: [id number] DOB: 1932-12-19  [name] is a 89 y.o. year old female who sees Jeani Sow, MD for primary care. I reached out to Lorenda Hatchet by phone today to offer care coordination services.  [name] was given information about Care Coordination services today including:   The Care Coordination services include support from the care team which includes your Nurse Coordinator, Clinical Social Worker, or Pharmacist.  The Care Coordination team is here to help remove barriers to the health concerns and goals most important to you. Care Coordination services are voluntary, and the patient may decline or stop services at any time by request to their care team member.   Care Coordination Consent Status: Patient agreed to services and verbal consent obtained.   Follow up plan:  Telephone appointment with care coordination team member scheduled for:  01/26/2023  Encounter Outcome:  Pt. Scheduled from referral   Burman Nieves, [facility] Care Coordination Care Guide Direct Dial: [phone]

## 2023-01-26 ENCOUNTER — Encounter: Payer: Self-pay | Admitting: Licensed Clinical Social Worker

## 2023-01-26 ENCOUNTER — Telehealth: Payer: Self-pay | Admitting: Licensed Clinical Social Worker

## 2023-01-26 NOTE — Patient Outreach (Signed)
  Care Coordination   01/26/2023 Name: [name] MRN: [id number] DOB: 1933/03/08   Care Coordination Outreach Attempts:  An unsuccessful telephone outreach was attempted for a scheduled appointment today.  Follow Up Plan:  Additional outreach attempts will be made to offer the patient care coordination information and services.   Encounter Outcome:  No Answer   Care Coordination Interventions:  No, not indicated    Jenel Lucks, MSW, LCSW [facility] Care Management [place]  [facility] [email or web address] Phone [phone] 3:05 PM

## 2023-02-02 ENCOUNTER — Ambulatory Visit: Payer: Self-pay | Admitting: Licensed Clinical Social Worker

## 2023-02-05 NOTE — Patient Outreach (Signed)
  Care Coordination   Initial Visit Note   02/02/2023 Name: [name] MRN: [id number] DOB: 1932/11/09  [name] is a 89 y.o. year old female who sees Jeani Sow, MD for primary care. I spoke with  Lorenda Hatchet by phone today.  What matters to the patients health and wellness today?  Symptom Management    Goals Addressed             This Visit's Progress    Management of MH Symptoms   On track    Activities and task to complete in order to accomplish goals.   Keep all upcoming appointments discussed today Continue with compliance of taking medication prescribed by Doctor Implement healthy coping skills discussed to assist with management of symptoms         SDOH assessments and interventions completed:  No     Care Coordination Interventions:  Yes, provided  Interventions Today    Flowsheet Row Most Recent Value  Chronic Disease   Chronic disease during today's visit Hypertension (HTN), Chronic Obstructive Pulmonary Disease (COPD), Other  [Depression]  General Interventions   General Interventions Discussed/Reviewed General Interventions Reviewed, Doctor Visits, Durable Medical Equipment (DME)  Doctor Visits Discussed/Reviewed Doctor Visits Discussed  Durable Medical Equipment (DME) Gwenyth Ober utilizes a rollator to ambulate]  Mental Health Interventions   Mental Health Discussed/Reviewed Mental Health Discussed, Coping Strategies, Anxiety, Depression  [Pt receives strong support system from family and friends. States depression symptoms were elevated approx 4 weeks ago. Spouse feels like symptoms have decreased within the last week]  Nutrition Interventions   Nutrition Discussed/Reviewed Nutrition Discussed  [Patient and spouse order meals to assist with managing health conditions and stress]  Pharmacy Interventions   Pharmacy Dicussed/Reviewed Pharmacy Topics Discussed, Medication Adherence  Safety Interventions   Safety  Discussed/Reviewed Safety Discussed       Follow up plan: Follow up call scheduled for 2-4 weeks    Encounter Outcome:  Patient Visit Completed   Jenel Lucks, MSW, LCSW [facility] Care Management [facility]  [facility] [email or web address] Phone [phone] 8:13 PM

## 2023-02-05 NOTE — Patient Instructions (Signed)
Visit Information  Thank you for taking time to visit with me today. Please don't hesitate to contact me if I can be of assistance to you.   Following are the goals we discussed today:   Goals Addressed             This Visit's Progress    Management of MH Symptoms   On track    Activities and task to complete in order to accomplish goals.   Keep all upcoming appointments discussed today Continue with compliance of taking medication prescribed by Doctor Implement healthy coping skills discussed to assist with management of symptoms         Our next appointment is by telephone on 10/07 at 3:30 PM  Please call the care guide team at [phone] if you need to cancel or reschedule your appointment.   If you are experiencing a Mental Health or Behavioral Health Crisis or need someone to talk to, please call the Suicide and Crisis Lifeline: 988 call 911   Patient verbalizes understanding of instructions and care plan provided today and agrees to view in MyChart. Active MyChart status and patient understanding of how to access instructions and care plan via MyChart confirmed with patient.     Jenel Lucks, MSW, LCSW [facility] Care Management [place]  [facility] [email or web address] Phone [phone] 8:14 PM

## 2023-02-14 DIAGNOSIS — H6123 Impacted cerumen, bilateral: Secondary | ICD-10-CM | POA: Diagnosis not present

## 2023-02-15 ENCOUNTER — Encounter: Payer: Self-pay | Admitting: Hematology & Oncology

## 2023-02-15 ENCOUNTER — Inpatient Hospital Stay: Payer: Medicare Other | Attending: Hematology & Oncology

## 2023-02-15 ENCOUNTER — Inpatient Hospital Stay (HOSPITAL_BASED_OUTPATIENT_CLINIC_OR_DEPARTMENT_OTHER): Payer: Medicare Other | Admitting: Hematology & Oncology

## 2023-02-15 ENCOUNTER — Other Ambulatory Visit: Payer: Self-pay

## 2023-02-15 VITALS — BP 115/61 | HR 77 | Temp 98.0°F | Resp 19 | Ht 62.0 in | Wt 128.0 lb

## 2023-02-15 DIAGNOSIS — D582 Other hemoglobinopathies: Secondary | ICD-10-CM | POA: Diagnosis not present

## 2023-02-15 DIAGNOSIS — Z862 Personal history of diseases of the blood and blood-forming organs and certain disorders involving the immune mechanism: Secondary | ICD-10-CM

## 2023-02-15 DIAGNOSIS — D751 Secondary polycythemia: Secondary | ICD-10-CM | POA: Diagnosis not present

## 2023-02-15 DIAGNOSIS — J301 Allergic rhinitis due to pollen: Secondary | ICD-10-CM | POA: Diagnosis not present

## 2023-02-15 DIAGNOSIS — E0869 Diabetes mellitus due to underlying condition with other specified complication: Secondary | ICD-10-CM

## 2023-02-15 DIAGNOSIS — R7871 Abnormal lead level in blood: Secondary | ICD-10-CM

## 2023-02-15 DIAGNOSIS — Z87891 Personal history of nicotine dependence: Secondary | ICD-10-CM | POA: Diagnosis not present

## 2023-02-15 DIAGNOSIS — M4802 Spinal stenosis, cervical region: Secondary | ICD-10-CM | POA: Insufficient documentation

## 2023-02-15 LAB — CMP (CANCER CENTER ONLY)
ALT: 24 U/L (ref 0–44)
AST: 29 U/L (ref 15–41)
Albumin: 4.4 g/dL (ref 3.5–5.0)
Alkaline Phosphatase: 72 U/L (ref 38–126)
Anion gap: 7 (ref 5–15)
BUN: 27 mg/dL — ABNORMAL HIGH (ref 8–23)
CO2: 29 mmol/L (ref 22–32)
Calcium: 9.8 mg/dL (ref 8.9–10.3)
Chloride: 105 mmol/L (ref 98–111)
Creatinine: 0.97 mg/dL (ref 0.44–1.00)
GFR, Estimated: 56 mL/min — ABNORMAL LOW (ref >60)
Glucose, Bld: 101 mg/dL — ABNORMAL HIGH (ref 70–99)
Potassium: 4.5 mmol/L (ref 3.5–5.1)
Sodium: 141 mmol/L (ref 135–145)
Total Bilirubin: 0.4 mg/dL (ref 0.3–1.2)
Total Protein: 6.9 g/dL (ref 6.5–8.1)

## 2023-02-15 LAB — SAVE SMEAR(SSMR), FOR PROVIDER SLIDE REVIEW

## 2023-02-15 LAB — CBC WITH DIFFERENTIAL (CANCER CENTER ONLY)
Abs Immature Granulocytes: 0.02 10*3/uL (ref 0.00–0.07)
Basophils Absolute: 0.1 10*3/uL (ref 0.0–0.1)
Basophils Relative: 1 %
Eosinophils Absolute: 0 10*3/uL (ref 0.0–0.5)
Eosinophils Relative: 0 %
HCT: 45.7 % (ref 36.0–46.0)
Hemoglobin: 15.1 g/dL — ABNORMAL HIGH (ref 12.0–15.0)
Immature Granulocytes: 0 %
Lymphocytes Relative: 22 %
Lymphs Abs: 1.2 10*3/uL (ref 0.7–4.0)
MCH: 31.3 pg (ref 26.0–34.0)
MCHC: 33 g/dL (ref 30.0–36.0)
MCV: 94.6 fL (ref 80.0–100.0)
Monocytes Absolute: 0.3 10*3/uL (ref 0.1–1.0)
Monocytes Relative: 6 %
Neutro Abs: 4 10*3/uL (ref 1.7–7.7)
Neutrophils Relative %: 71 %
Platelet Count: 227 10*3/uL (ref 150–400)
RBC: 4.83 MIL/uL (ref 3.87–5.11)
RDW: 12.7 % (ref 11.5–15.5)
WBC Count: 5.7 10*3/uL (ref 4.0–10.5)
nRBC: 0 % (ref 0.0–0.2)

## 2023-02-15 LAB — LACTATE DEHYDROGENASE: LDH: 175 U/L (ref 98–192)

## 2023-02-15 LAB — FERRITIN: Ferritin: 54 ng/mL (ref 11–307)

## 2023-02-15 LAB — VITAMIN B12: Vitamin B-12: 1145 pg/mL — ABNORMAL HIGH (ref 180–914)

## 2023-02-15 NOTE — Progress Notes (Signed)
Referral MD  Reason for Referral: Erythrocytosis  Chief Complaint  Patient presents with   New Patient (Initial Visit)  : I am glad to be seeing you again.  HPI: [name] is actually known to me.  She is a very charming 89 year old white female.  I have not seen her for about 11 years.  We have seen her in the past because of iron deficiency..  She has numerous health issues.  She is followed out at [facility] I think for neurology and rheumatology.  She was told that she had to have a blood count.  She had blood work that was done back on 06/29/2022.  It showed a white cell count of 6.8.  Hemoglobin 15.2.  Platelet count 263,000.  MCV was 92.  Of note, back in August 2023, her hemoglobin was 14.5 with a hematocrit of 42.5.  Going back to 2015, her white cell count was 7.5.  Hemoglobin 13.3.  Hematocrit 40.7.  Platelet count 276,000.  Of note, we have given her IV iron in the past.  She is followed at [facility].  She has cervical spinal stenosis.  She has sensorimotor neuropathy.  She does not smoke.  I think she smoked in the past.  She really does not have any alcohol use.  She has had no weight loss or weight gain.  She is trying to lose a bit of weight.  She has had no rashes.  There is been no change in bowel or bladder habits.  She has had no problems with COVID.  Because of the erythrocytosis, she was kindly referred to the [facility].   Overall, I would say performance status is probably ECOG 2. 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 (HCC) 03/2011   chronic sinusitis 08/08/2012   Colon polyp    Constipation    COPD (chronic obstructive pulmonary disease) (HCC)    Cystocele 09/16/2012   GERD (gastroesophageal reflux disease)     Hyperlipidemia    Hypertension    Hypothyroid    Neurogenic bladder 2013   husband caths pt, manages with timed void   Osteoporosis    Pulmonary embolism (HCC)    1980s   Raynaud phenomenon    Renal cyst    Non-complex, contrast MRI 2013 with L>R non-complex cysts, dominant 3.7 left lower pole   Seizures (HCC)    1990 last seizures on meds Phenobarb   Shingles late 90's   Skin cancer    squamous cell   Thyroid nodule    Right thyroid lobe, only seen on Sagittal imaging measures 2.3 cm in craniocaudal dimension and appears stable   Tubular adenoma    Vitamin D deficiency   :   Past Surgical History:  Procedure Laterality Date   ABDOMINAL HYSTERECTOMY     ANTERIOR FUSION CLIVUS-C2 EXTRAORAL W/ ODONTOID EXCISION  12/2012   APPENDECTOMY     ARTHROSCOPIC REPAIR ACL Left 1976   BASAL CELL CARCINOMA EXCISION     face   BLADDER SUSPENSION     BREAST DUCTAL SYSTEM EXCISION Right 01/15/2014   Procedure: EXCISION DUCTAL SYSTEM RIGHT BREAST;  Surgeon: Almond Lint, MD;  Location: [facility];  Service: General;  Laterality: Right;  BREAST EXCISIONAL BIOPSY Right    CARPAL TUNNEL RELEASE Bilateral    B, mult times   CATARACT EXTRACTION     bilateral   cervical neck ablation     x 7, C3-C6/3 screws and plate   CESAREAN SECTION     CYSTOCELE REPAIR     DILATION AND CURETTAGE OF UTERUS     duptyren's contracture right hand     FINGER ARTHRODESIS Right 05/06/2015   Procedure: RIGHT RING PROXIMAL INTERPHALANGEAL FUSION (PIP);  Surgeon: Betha Loa, MD;  Location: [facility];  Service: Orthopedics;  Laterality: Right;   FINGER GANGLION CYST EXCISION     right   HEMICOLECTOMY Right    JOINT REPLACEMENT     right and left basal joints of thumbs   left finger fusion     3 fingers on left hand/one finger right   left ovary and tube removed     NASAL POLYP SURGERY     4 sinus surgeries   NASAL SEPTUM SURGERY     PANNICULECTOMY     PROXIMAL  INTERPHALANGEAL FUSION (PIP) Left 09/09/2013   Procedure: FUSION LEFT INDEX PROXIMAL INTERPHALANGEAL JOINT (PIP);  Surgeon: Wyn Forster., MD;  Location: [facility];  Service: Orthopedics;  Laterality: Left;   right median nerve decompression     SHOULDER ARTHROSCOPY     x2 left, 1 right   sinus surgeries     x 4   squamous lesions removed     neck and face   STERIOD INJECTION Right 05/06/2015   Procedure: STEROID INJECTION;  Surgeon: Betha Loa, MD;  Location: [facility];  Service: Orthopedics;  Laterality: Right;  Right Index Finger Proximal InterPhalangeal Joint Injection   TONSILLECTOMY AND ADENOIDECTOMY     TUBAL LIGATION     vocal polyps removed    :   Current Outpatient Medications:    albuterol (PROVENTIL) (2.5 MG/3ML) 0.083% nebulizer solution, USE 1 VIAL IN NEBULIZER 4 TIMES DAILY, Disp: , Rfl:    amLODipine (NORVASC) 10 MG tablet, Take 1 tablet (10 mg total) by mouth daily., Disp: 90 tablet, Rfl: 3   arformoterol (BROVANA) 15 MCG/2ML NEBU, USE 1 VIAL IN NEBULIZER TWICE DAILY, Disp: , Rfl:    atorvastatin (LIPITOR) 40 MG tablet, Take 1 tablet (40 mg total) by mouth at bedtime., Disp: 90 tablet, Rfl: 3   B-D TB SYRINGE 1CC/27GX1/2" 27G X 1/2" 1 ML MISC, USE 2 APPLICATIONS ROUTE ONCE A WEEK., Disp: , Rfl:    budesonide (PULMICORT) 0.25 MG/2ML nebulizer solution, Take by nebulization 2 (two) times daily., Disp: , Rfl:    CALCIUM PO, Take 600 mg by mouth daily., Disp: , Rfl:    carbamazepine (TEGRETOL XR) 100 MG 12 hr tablet, Take by mouth., Disp: , Rfl:    Cholecalciferol 25 MCG (1000 UT) capsule, Take by mouth., Disp: , Rfl:    Cyanocobalamin (B-12 PO), Take by mouth daily., Disp: , Rfl:    gabapentin (NEURONTIN) 300 MG capsule, Take 300 mg by mouth at bedtime., Disp: , Rfl:    levothyroxine (SYNTHROID) 50 MCG tablet, TAKE 1 TABLET(50 MCG) BY MOUTH DAILY BEFORE BREAKFAST, Disp: 90 tablet, Rfl: 3   montelukast (SINGULAIR) 10 MG tablet, Take 1  tablet (10 mg total) by mouth at bedtime., Disp: 90 tablet, Rfl: 3   PHENobarbital (LUMINAL) 32.4 MG tablet, TAKE 1 TABLET BY MOUTH AT 6AM AND 2 TABLETS BY MOUTH AT 8 PM, Disp: 270 tablet, Rfl: 1  predniSONE (DELTASONE) 5 MG tablet, Take 15 mg by mouth daily., Disp: , Rfl:    VENTOLIN HFA 108 (90 Base) MCG/ACT inhaler, SMARTSIG:1 Puff(s) Via Inhaler Every 4 Hours PRN, Disp: , Rfl: :  :   Allergies  Allergen Reactions   Iodinated Contrast Media Anaphylaxis    Cardiac arrest   Iodine Other (See Comments)    Cardiac arrest As young adult, received iodinated contrast for IVP, went into cardiac arrest, was resuscitated and hospitalized. Has not received iodinated contrast since that episode. Reports no adverse reaction to betadine.    Metrizamide Other (See Comments)    Cardiac arrest     Morphine Other (See Comments)    Cardiac arrest Patient has previously tolerated hydrocodone, hydromorphone and fentanyl    Morphine And Codeine Other (See Comments)    Cardiac arrest   Cocklebur    Lambs Quarters    Mugwort    Sheep Sorrel    Spiny Pigweed (Amaranthus Spinosus) Skin Test   :   Family History  Problem Relation Age of Onset   Heart disease Mother    Hyperlipidemia Mother        M, aunt   Ovarian cancer Sister    Lung cancer Sister        lung - stage 1   Thyroid cancer Sister    Colon cancer Father        53   Malignant hyperthermia Cousin    Stomach cancer Other        first cousin   Skin cancer Daughter    Breast cancer Neg Hx    Esophageal cancer Neg Hx    Pancreatic cancer Neg Hx   :   Social History   Socioeconomic History   Marital status: Married    Spouse name: Not on file   Number of children: 3   Years of education: Not on file   Highest education level: Not on file  Occupational History   Occupation: retired, Education administrator: RETIRED  Tobacco Use   Smoking status: Former    Current packs/day: 0.00    Average packs/day: 0.5  packs/day for 3.0 years (1.5 ttl pk-yrs)    Types: Cigarettes    Start date: 05/30/1975    Quit date: 05/29/1978    Years since quitting: 44.7   Smokeless tobacco: Never   Tobacco comments:    quit smoking 35 years ago  Vaping Use   Vaping status: Never Used  Substance and Sexual Activity   Alcohol use: No   Drug use: No   Sexual activity: Yes  Other Topics Concern   Not on file  Social History Narrative   Pt lives at home with her husband. She is originally from [place]. Moved to [place] in 2005.     3 grown children - Son is a Education officer, community in the area, son in [place], daughter in [place]   Pt is a retired Engineer, civil (consulting)   Social Determinants of Corporate investment banker Strain: Low Risk  (11/16/2022)   Overall Financial Resource Strain (CARDIA)    Difficulty of Paying Living Expenses: Not hard at all  Food Insecurity: No Food Insecurity (11/16/2022)   Hunger Vital Sign    Worried About Running Out of Food in the Last Year: Never true    Ran Out of Food in the Last Year: Never true  Transportation Needs: No Transportation Needs (11/16/2022)   PRAPARE - Administrator, Civil Service (Medical):  No    Lack of Transportation (Non-Medical): No  Physical Activity: Inactive (11/16/2022)   Exercise Vital Sign    Days of Exercise per Week: 0 days    Minutes of Exercise per Session: 0 min  Stress: No Stress Concern Present (11/16/2022)   [employer] of Occupational Health - Occupational Stress Questionnaire    Feeling of Stress : Not at all  Social Connections: Socially Integrated (11/16/2022)   Social Connection and Isolation Panel [NHANES]    Frequency of Communication with Friends and Family: More than three times a week    Frequency of Social Gatherings with Friends and Family: More than three times a week    Attends Religious Services: 1 to 4 times per year    Active Member of [employer] or Organizations: Yes    Attends Banker Meetings: 1 to 4 times per year    Marital  Status: Married  Catering manager Violence: Not At Risk (11/16/2022)   Humiliation, Afraid, Rape, and Kick questionnaire    Fear of Current or Ex-Partner: No    Emotionally Abused: No    Physically Abused: No    Sexually Abused: No  :  Pertinent items are noted in HPI.  Exam: Vital signs with temperature of 98.  Pulse 77.  Blood pressure 115/61.  Weight is 128 pounds.  @IPVITALS @ Physical Exam Vitals reviewed.  HENT:     Head: Normocephalic and atraumatic.  Eyes:     Pupils: Pupils are equal, round, and reactive to light.  Cardiovascular:     Rate and Rhythm: Normal rate and regular rhythm.     Heart sounds: Normal heart sounds.  Pulmonary:     Effort: Pulmonary effort is normal.     Breath sounds: Normal breath sounds.  Abdominal:     General: Bowel sounds are normal.     Palpations: Abdomen is soft.  Musculoskeletal:        General: No tenderness or deformity. Normal range of motion.     Cervical back: Normal range of motion.  Lymphadenopathy:     Cervical: No cervical adenopathy.  Skin:    General: Skin is warm and dry.     Findings: No erythema or rash.  Neurological:     Mental Status: She is alert and oriented to person, place, and time.  Psychiatric:        Behavior: Behavior normal.        Thought Content: Thought content normal.        Judgment: Judgment normal.       Recent Labs    02/15/23 1350  WBC 5.7  HGB 15.1*  HCT 45.7  PLT 227    Recent Labs    02/15/23 1350  NA 141  K 4.5  CL 105  CO2 29  GLUCOSE 101*  BUN 27*  CREATININE 0.97  CALCIUM 9.8    Blood smear review: Normochromic normocytic population of red blood cells.  There are no nucleated red blood cells.  I see no teardrop cells.  She has no schistocytes or spherocytes.  She has no rouleaux formation.  White blood cells appear normal in morphology and maturation.  There is no immature myeloid or lymphoid cells.  She has no hypersegmented polys.  Platelets are adequate and number  and size.  Pathology: None    Assessment and Plan: [name] is a very charming 89 year old white female.  She has minimal erythrocytosis.  I really cannot imagine that this is going to be a  problem for her.  She does not have microcytic indices.  She has a normal white cell count and platelet count.  She has a normal white cell differential.  Again, had to believe that this is going to be a more reactive erythrocytosis.  However, we will send off the JAK2 panel to make sure that she does not have an underlying myeloproliferative disorder.  I do not see that she needs any phlebotomies.  She does not need to have a bone marrow biopsy, unless she is JAK2 positive.  We will see what her erythropoietin level is.  We will see what her iron studies show.  It was a delightful seeing her again.  I do remember seeing her in the past.  She is a tough lady.  She is going through a whole lot.  I would like to see her back around the Holiday Season just that we can monitor her blood count.

## 2023-02-16 ENCOUNTER — Other Ambulatory Visit: Payer: Self-pay | Admitting: Family Medicine

## 2023-02-16 LAB — IRON AND IRON BINDING CAPACITY (CC-WL,HP ONLY)
Iron: 114 ug/dL (ref 28–170)
Saturation Ratios: 35 % — ABNORMAL HIGH (ref 10.4–31.8)
TIBC: 328 ug/dL (ref 250–450)
UIBC: 214 ug/dL (ref 148–442)

## 2023-02-17 LAB — ERYTHROPOIETIN: Erythropoietin: 9.5 m[IU]/mL (ref 2.6–18.5)

## 2023-02-21 ENCOUNTER — Telehealth: Payer: Self-pay

## 2023-02-21 DIAGNOSIS — J301 Allergic rhinitis due to pollen: Secondary | ICD-10-CM | POA: Diagnosis not present

## 2023-02-21 LAB — JAK2 (INCLUDING V617F AND EXON 12), MPL,& CALR-NEXT GEN SEQ

## 2023-02-21 NOTE — Telephone Encounter (Signed)
Advised via MyChart.

## 2023-02-21 NOTE — Telephone Encounter (Signed)
-----   Message from [name] sent at 02/21/2023  9:15 AM EDT ----- Call and let her know that the special genetic test that we ran on her blood came back negative.  This is wonderful.  As such, she does not have a bone marrow problem.  Cindee Lame

## 2023-02-28 NOTE — Progress Notes (Signed)
Subjective:     Patient ID: [name], female    DOB: 03-Dec-1932, 89 y.o.   MRN: [id number]  No chief complaint on file.   HPI  Moods - ***  *** - ***  *** - ***  *** - ***  *** - ***  *** - ***   There are no preventive care reminders to display for this patient.  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 (HCC) 03/2011   chronic sinusitis 08/08/2012   Colon polyp    Constipation    COPD (chronic obstructive pulmonary disease) (HCC)    Cystocele 09/16/2012   GERD (gastroesophageal reflux disease)    Hyperlipidemia    Hypertension    Hypothyroid    Neurogenic bladder 2013   husband caths pt, manages with timed void   Osteoporosis    Pulmonary embolism (HCC)    1980s   Raynaud phenomenon    Renal cyst    Non-complex, contrast MRI 2013 with L>R non-complex cysts, dominant 3.7 left lower pole   Seizures (HCC)    1990 last seizures on meds Phenobarb   Shingles late 90's   Skin cancer    squamous cell   Thyroid nodule    Right thyroid lobe, only seen on Sagittal imaging measures 2.3 cm in craniocaudal dimension and appears stable   Tubular adenoma    Vitamin D deficiency     Past Surgical History:  Procedure Laterality Date   ABDOMINAL HYSTERECTOMY     ANTERIOR FUSION CLIVUS-C2 EXTRAORAL W/ ODONTOID EXCISION  12/2012   APPENDECTOMY     ARTHROSCOPIC REPAIR ACL Left 1976   BASAL CELL CARCINOMA EXCISION     face   BLADDER SUSPENSION     BREAST DUCTAL SYSTEM EXCISION Right 01/15/2014   Procedure: EXCISION DUCTAL SYSTEM RIGHT BREAST;  Surgeon: Almond Lint, MD;  Location: [facility];  Service: General;  Laterality: Right;   BREAST EXCISIONAL BIOPSY Right    CARPAL TUNNEL RELEASE Bilateral    B, mult times   CATARACT  EXTRACTION     bilateral   cervical neck ablation     x 7, C3-C6/3 screws and plate   CESAREAN SECTION     CYSTOCELE REPAIR     DILATION AND CURETTAGE OF UTERUS     duptyren's contracture right hand     FINGER ARTHRODESIS Right 05/06/2015   Procedure: RIGHT RING PROXIMAL INTERPHALANGEAL FUSION (PIP);  Surgeon: Betha Loa, MD;  Location: [facility];  Service: Orthopedics;  Laterality: Right;   FINGER GANGLION CYST EXCISION     right   HEMICOLECTOMY Right    JOINT REPLACEMENT     right and left basal joints of thumbs   left finger fusion     3 fingers on left hand/one finger right   left ovary and tube removed     NASAL POLYP SURGERY     4 sinus surgeries   NASAL SEPTUM SURGERY     PANNICULECTOMY     PROXIMAL INTERPHALANGEAL FUSION (PIP) Left 09/09/2013   Procedure: FUSION LEFT INDEX PROXIMAL INTERPHALANGEAL JOINT (PIP);  Surgeon: Wyn Forster., MD;  Location[facility];  Service: Orthopedics;  Laterality: Left;  right median nerve decompression     SHOULDER ARTHROSCOPY     x2 left, 1 right   sinus surgeries     x 4   squamous lesions removed     neck and face   STERIOD INJECTION Right 05/06/2015   Procedure: STEROID INJECTION;  Surgeon: Betha Loa, MD;  Location: [facility];  Service: Orthopedics;  Laterality: Right;  Right Index Finger Proximal InterPhalangeal Joint Injection   TONSILLECTOMY AND ADENOIDECTOMY     TUBAL LIGATION     vocal polyps removed       Current Outpatient Medications:    albuterol (PROVENTIL) (2.5 MG/3ML) 0.083% nebulizer solution, USE 1 VIAL IN NEBULIZER 4 TIMES DAILY, Disp: , Rfl:    amLODipine (NORVASC) 10 MG tablet, Take 1 tablet (10 mg total) by mouth daily., Disp: 90 tablet, Rfl: 3   arformoterol (BROVANA) 15 MCG/2ML NEBU, USE 1 VIAL IN NEBULIZER TWICE DAILY, Disp: , Rfl:    atorvastatin (LIPITOR) 40 MG tablet, TAKE 1 TABLET BY MOUTH EVERY NIGHT AT BEDTIME, Disp: 90 tablet, Rfl: 3   B-D TB  SYRINGE 1CC/27GX1/2" 27G X 1/2" 1 ML MISC, USE 2 APPLICATIONS ROUTE ONCE A WEEK., Disp: , Rfl:    budesonide (PULMICORT) 0.25 MG/2ML nebulizer solution, Take by nebulization 2 (two) times daily., Disp: , Rfl:    CALCIUM PO, Take 600 mg by mouth daily., Disp: , Rfl:    carbamazepine (TEGRETOL XR) 100 MG 12 hr tablet, Take by mouth., Disp: , Rfl:    Cholecalciferol 25 MCG (1000 UT) capsule, Take by mouth., Disp: , Rfl:    Cyanocobalamin (B-12 PO), Take by mouth daily., Disp: , Rfl:    gabapentin (NEURONTIN) 300 MG capsule, Take 300 mg by mouth at bedtime., Disp: , Rfl:    levothyroxine (SYNTHROID) 50 MCG tablet, TAKE 1 TABLET(50 MCG) BY MOUTH DAILY BEFORE BREAKFAST, Disp: 90 tablet, Rfl: 3   montelukast (SINGULAIR) 10 MG tablet, Take 1 tablet (10 mg total) by mouth at bedtime., Disp: 90 tablet, Rfl: 3   PHENobarbital (LUMINAL) 32.4 MG tablet, TAKE 1 TABLET BY MOUTH AT 6AM AND 2 TABLETS BY MOUTH AT 8 PM, Disp: 270 tablet, Rfl: 1   predniSONE (DELTASONE) 5 MG tablet, Take 15 mg by mouth daily., Disp: , Rfl:    VENTOLIN HFA 108 (90 Base) MCG/ACT inhaler, SMARTSIG:1 Puff(s) Via Inhaler Every 4 Hours PRN, Disp: , Rfl:   Allergies  Allergen Reactions   Iodinated Contrast Media Anaphylaxis    Cardiac arrest   Iodine Other (See Comments)    Cardiac arrest As young adult, received iodinated contrast for IVP, went into cardiac arrest, was resuscitated and hospitalized. Has not received iodinated contrast since that episode. Reports no adverse reaction to betadine.    Metrizamide Other (See Comments)    Cardiac arrest     Morphine Other (See Comments)    Cardiac arrest Patient has previously tolerated hydrocodone, hydromorphone and fentanyl    Morphine And Codeine Other (See Comments)    Cardiac arrest   Cocklebur    Lambs Quarters    Mugwort    Sheep Sorrel    Spiny Pigweed (Amaranthus Spinosus) Skin Test    ROS neg/noncontributory except as noted HPI/below      Objective:     There  were no vitals taken for this visit. Wt Readings from Last 3 Encounters:  02/15/23 128 lb (58.1 kg)  01/22/23 130 lb 6 oz (59.1 kg)  11/16/22 127 lb (57.6 kg)  Physical Exam   Gen: WDWN NAD HEENT: NCAT, conjunctiva not injected, sclera nonicteric NECK:  supple, no thyromegaly, no nodes, no carotid bruits CARDIAC: RRR, S1S2+, no murmur. DP 2+B LUNGS: CTAB. No wheezes ABDOMEN:  BS+, soft, NTND, No HSM, no masses EXT:  no edema MSK: no gross abnormalities.  NEURO: A&O x3.  CN II-XII intact.  PSYCH: normal mood. Good eye contact     Assessment & Plan:  There are no diagnoses linked to this encounter.  No follow-ups on file.  I,Rachel Rivera,acting as a scribe for Angelena Sole, MD.,have documented all relevant documentation on the behalf of Angelena Sole, MD,as directed by  Angelena Sole, MD while in the presence of Angelena Sole, MD.  I, Isabelle Course, have reviewed all documentation for this visit. The documentation on 02/28/23 for the exam, diagnosis, procedures, and orders are all accurate and complete.  ***   Isabelle Course

## 2023-03-01 ENCOUNTER — Ambulatory Visit: Payer: Medicare Other | Admitting: Family Medicine

## 2023-03-01 ENCOUNTER — Encounter: Payer: Self-pay | Admitting: Family Medicine

## 2023-03-01 VITALS — BP 102/60 | HR 80 | Temp 97.7°F | Resp 16 | Ht 62.0 in | Wt 129.5 lb

## 2023-03-01 DIAGNOSIS — F4321 Adjustment disorder with depressed mood: Secondary | ICD-10-CM | POA: Diagnosis not present

## 2023-03-01 DIAGNOSIS — Z23 Encounter for immunization: Secondary | ICD-10-CM | POA: Diagnosis not present

## 2023-03-01 MED ORDER — ESCITALOPRAM OXALATE 5 MG PO TABS: 5.0000 mg | ORAL_TABLET | Freq: Every day | ORAL | 1 refills | Status: DC

## 2023-03-01 NOTE — Patient Instructions (Signed)
Lexapro-1/2 tab daily to start for 1 week then whole tablet.

## 2023-03-02 DIAGNOSIS — Z23 Encounter for immunization: Secondary | ICD-10-CM | POA: Diagnosis not present

## 2023-03-05 ENCOUNTER — Ambulatory Visit: Payer: Self-pay | Admitting: Licensed Clinical Social Worker

## 2023-03-06 NOTE — Patient Outreach (Signed)
  Care Coordination   Follow Up Visit Note   03/05/2023 Name: [name] MRN: [id number] DOB: 01-May-1933  [name] is a 89 y.o. year old female who sees Jeani Sow, MD for primary care. I spoke with  [name] by phone today.  What matters to the patients health and wellness today?  Symptom Management    Goals Addressed             This Visit's Progress    Management of MH Symptoms   On track    Activities and task to complete in order to accomplish goals.   Keep all upcoming appointments discussed today Continue with compliance of taking medication prescribed by Doctor Implement healthy coping skills discussed to assist with management of symptoms         SDOH assessments and interventions completed:  No     Care Coordination Interventions:  Yes, provided  Interventions Today    Flowsheet Row Most Recent Value  Chronic Disease   Chronic disease during today's visit Hypertension (HTN), Chronic Obstructive Pulmonary Disease (COPD), Other  [Depression]  General Interventions   General Interventions Discussed/Reviewed General Interventions Reviewed, Doctor Visits  Doctor Visits Discussed/Reviewed Doctor Visits Reviewed  Mental Health Interventions   Mental Health Discussed/Reviewed Mental Health Reviewed, Coping Strategies, Depression  [Prior medications negatively impacted her sleep. Medication was adjusted. Filled Lexapro and started medication. No change in depression symptoms at this time. Strategies discussed to promote positive mood]  Pharmacy Interventions   Pharmacy Dicussed/Reviewed Pharmacy Topics Reviewed, Medication Adherence  [Patien did not have any barriers obtaining medications]  Safety Interventions   Safety Discussed/Reviewed Safety Reviewed, Home Safety  [Patient continues to use rollator noting inability to ambulate up and down stairs. Still doing breathing tx in morning]       Follow up plan: Follow up call  scheduled for 2 weeks    Encounter Outcome:  Patient Visit Completed   Jenel Lucks, MSW, LCSW [facility] Care Management [facility]  [facility] [email or web address] Phone [phone] 12:19 PM

## 2023-03-06 NOTE — Patient Instructions (Signed)
Visit Information  Thank you for taking time to visit with me today. Please don't hesitate to contact me if I can be of assistance to you.   Following are the goals we discussed today:   Goals Addressed             This Visit's Progress    Management of MH Symptoms   On track    Activities and task to complete in order to accomplish goals.   Keep all upcoming appointments discussed today Continue with compliance of taking medication prescribed by Doctor Implement healthy coping skills discussed to assist with management of symptoms         Our next appointment is by telephone on 10/21 at 3:30 pm  Please call the care guide team at [phone] if you need to cancel or reschedule your appointment.   If you are experiencing a Mental Health or Behavioral Health Crisis or need someone to talk to, please call the Suicide and Crisis Lifeline: 988 call 911   Patient verbalizes understanding of instructions and care plan provided today and agrees to view in MyChart. Active MyChart status and patient understanding of how to access instructions and care plan via MyChart confirmed with patient.     Jenel Lucks, MSW, LCSW [facility] Care Management [place]  [facility] [email or web address] Phone [phone] 12:20 PM

## 2023-03-07 DIAGNOSIS — M25532 Pain in left wrist: Secondary | ICD-10-CM | POA: Diagnosis not present

## 2023-03-07 DIAGNOSIS — S60212A Contusion of left wrist, initial encounter: Secondary | ICD-10-CM | POA: Diagnosis not present

## 2023-03-19 ENCOUNTER — Ambulatory Visit: Payer: Self-pay | Admitting: Licensed Clinical Social Worker

## 2023-03-19 DIAGNOSIS — S60212A Contusion of left wrist, initial encounter: Secondary | ICD-10-CM | POA: Diagnosis not present

## 2023-03-20 ENCOUNTER — Ambulatory Visit
Admission: RE | Admit: 2023-03-20 | Discharge: 2023-03-20 | Disposition: A | Discharge status: Home / Self Care | Payer: Medicare Other | Source: Ambulatory Visit | Attending: Family Medicine | Admitting: Family Medicine

## 2023-03-20 DIAGNOSIS — N951 Menopausal and female climacteric states: Secondary | ICD-10-CM

## 2023-03-20 DIAGNOSIS — M8580 Other specified disorders of bone density and structure, unspecified site: Secondary | ICD-10-CM

## 2023-03-20 DIAGNOSIS — M8588 Other specified disorders of bone density and structure, other site: Secondary | ICD-10-CM | POA: Diagnosis not present

## 2023-03-20 DIAGNOSIS — M810 Age-related osteoporosis without current pathological fracture: Secondary | ICD-10-CM

## 2023-03-20 DIAGNOSIS — Z7952 Long term (current) use of systemic steroids: Secondary | ICD-10-CM | POA: Diagnosis not present

## 2023-03-20 DIAGNOSIS — Z90722 Acquired absence of ovaries, bilateral: Secondary | ICD-10-CM | POA: Diagnosis not present

## 2023-03-20 DIAGNOSIS — E2839 Other primary ovarian failure: Secondary | ICD-10-CM | POA: Diagnosis not present

## 2023-03-20 LAB — HM DEXA SCAN

## 2023-03-20 NOTE — Patient Instructions (Signed)
Visit Information  Thank you for taking time to visit with me today. Please don't hesitate to contact me if I can be of assistance to you.   Following are the goals we discussed today:   Goals Addressed             This Visit's Progress    Management of MH Symptoms   On track    Activities and task to complete in order to accomplish goals.   Keep all upcoming appointments discussed today Continue with compliance of taking medication prescribed by Doctor Implement healthy coping skills discussed to assist with management of symptoms         Our next appointment is by telephone on 12/2 at 3:30 PM  Please call the care guide team at [phone] if you need to cancel or reschedule your appointment.   If you are experiencing a Mental Health or Behavioral Health Crisis or need someone to talk to, please call the Suicide and Crisis Lifeline: 988 call 911   Patient verbalizes understanding of instructions and care plan provided today and agrees to view in MyChart. Active MyChart status and patient understanding of how to access instructions and care plan via MyChart confirmed with patient.     Jenel Lucks, MSW, LCSW [facility] Care Management [place]  [facility] [email or web address] Phone [phone] 11:25 AM

## 2023-03-20 NOTE — Patient Outreach (Signed)
  Care Coordination   Follow Up Visit Note   03/19/2023 Name: [name] MRN: [id number] DOB: [birth year]  [name] is a 89 y.o. year old female who sees Jeani Sow, MD for primary care. I spoke with  Lorenda Hatchet by phone today.  What matters to the patients health and wellness today?  Symptom Management    Goals Addressed             This Visit's Progress    Management of MH Symptoms   On track    Activities and task to complete in order to accomplish goals.   Keep all upcoming appointments discussed today Continue with compliance of taking medication prescribed by Doctor Implement healthy coping skills discussed to assist with management of symptoms         SDOH assessments and interventions completed:  No     Care Coordination Interventions:  Yes, provided  Interventions Today    Flowsheet Row Most Recent Value  Chronic Disease   Chronic disease during today's visit Hypertension (HTN), Chronic Obstructive Pulmonary Disease (COPD), Other  [Depression]  General Interventions   General Interventions Discussed/Reviewed General Interventions Reviewed, Doctor Visits, Community Resources  Doctor Visits Discussed/Reviewed Doctor Visits Reviewed  Mental Health Interventions   Mental Health Discussed/Reviewed Mental Health Reviewed, Coping Strategies, Depression  [Pt identified triggers to depression symptoms. Compliant with med management to assist with symptoms, denies side effects or decrease in symptoms. Patient is not interested in counseling, would like to just participate in med mangement through PCP]  Pharmacy Interventions   Pharmacy Dicussed/Reviewed Pharmacy Topics Reviewed, Medication Adherence  Safety Interventions   Safety Discussed/Reviewed Safety Reviewed, Fall Risk  [Patient fell two weeks ago and was placed in splints to assist with fractures.]       Follow up plan: Follow up call scheduled for 4-6 weeks     Encounter Outcome:  Patient Visit Completed   Jenel Lucks, MSW, LCSW [facility] Care Management [facility]  [facility] [email or web address] Phone [phone] 11:24 AM

## 2023-03-22 ENCOUNTER — Other Ambulatory Visit: Payer: Self-pay | Admitting: Family Medicine

## 2023-03-23 ENCOUNTER — Encounter: Payer: Self-pay | Admitting: Family Medicine

## 2023-03-23 NOTE — Progress Notes (Signed)
Does have osteoporosis now.  Please sch appt(no rush) to discuss treatment

## 2023-03-31 ENCOUNTER — Other Ambulatory Visit: Payer: Self-pay | Admitting: Family Medicine

## 2023-03-31 DIAGNOSIS — I10 Essential (primary) hypertension: Secondary | ICD-10-CM

## 2023-04-02 ENCOUNTER — Telehealth: Payer: Self-pay | Admitting: Internal Medicine

## 2023-04-02 NOTE — Telephone Encounter (Signed)
Inbound call from patient, would like to discuss symptoms with a nurse. She states she is having dark brown stools and is worried. She was advised Dr. Rhea Belton does not have any availability at the moment but she could be placed on a cancellation list.

## 2023-04-02 NOTE — Telephone Encounter (Signed)
Pt states she has been having very dark brown/black stools. Denies any Iron supplements and reports she only took pepto one time and it was a while back. She is concerned about the change and would like to be seen. Pt scheduled to see Alcide Evener NP 04/10/23 at 1:30pm with Alcide Evener NP. Pt aware of appt.

## 2023-04-04 ENCOUNTER — Encounter: Payer: Self-pay | Admitting: Family Medicine

## 2023-04-04 ENCOUNTER — Ambulatory Visit: Payer: Medicare Other | Admitting: Family Medicine

## 2023-04-04 VITALS — BP 121/71 | HR 81 | Temp 97.5°F | Resp 16 | Ht 62.0 in | Wt 131.5 lb

## 2023-04-04 DIAGNOSIS — R569 Unspecified convulsions: Secondary | ICD-10-CM | POA: Diagnosis not present

## 2023-04-04 DIAGNOSIS — K3184 Gastroparesis: Secondary | ICD-10-CM | POA: Diagnosis not present

## 2023-04-04 DIAGNOSIS — Z9181 History of falling: Secondary | ICD-10-CM | POA: Diagnosis not present

## 2023-04-04 DIAGNOSIS — M810 Age-related osteoporosis without current pathological fracture: Secondary | ICD-10-CM | POA: Diagnosis not present

## 2023-04-04 DIAGNOSIS — Z7952 Long term (current) use of systemic steroids: Secondary | ICD-10-CM | POA: Diagnosis not present

## 2023-04-04 NOTE — Progress Notes (Signed)
Subjective:     Patient ID: [name], female    DOB: 21-Feb-1933, 89 y.o.   MRN: [id number]  Chief Complaint  Patient presents with   Results    Discuss bone density results and treatment options   Accompanied by her husband.  HPI Bone Density - In the past has taken Forteo injections for osteoporosis and saw no improvements. Was on Prolia for around 2 years with improvements, but stopped due to insurance and cost. Denies any fractures.   Weight - Reports she is concerned about not losing weight despite eating small meals. States this is the heaviest she has ever weighed. Limited in ability to exercise. Walking 1/2 a mile. Has eaten only yogurt for the past 2 days. States she feels like she weighs too much. BMI 24.05.  Bowel Movement -Endorses dark brown/ black loose stools more recently. Denies any changes in her diet.   Mood - On lexapro 5 mg daily. Denies depression or SI.   There are no preventive care reminders to display for this patient.  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 (HCC) 03/2011   chronic sinusitis 08/08/2012   Colon polyp    Constipation    COPD (chronic obstructive pulmonary disease) (HCC)    Cystocele 09/16/2012   GERD (gastroesophageal reflux disease)    Hyperlipidemia    Hypertension    Hypothyroid    Neurogenic bladder 2013   husband caths pt, manages with timed void   Osteoporosis    Pulmonary embolism (HCC)    1980s   Raynaud phenomenon    Renal cyst    Non-complex, contrast MRI 2013 with L>R non-complex cysts, dominant 3.7 left lower pole   Seizures (HCC)    1990 last seizures on meds Phenobarb   Shingles late 90's   Skin cancer    squamous cell   Thyroid nodule    Right thyroid lobe, only seen on Sagittal  imaging measures 2.3 cm in craniocaudal dimension and appears stable   Tubular adenoma    Vitamin D deficiency     Past Surgical History:  Procedure Laterality Date   ABDOMINAL HYSTERECTOMY     ANTERIOR FUSION CLIVUS-C2 EXTRAORAL W/ ODONTOID EXCISION  12/2012   APPENDECTOMY     ARTHROSCOPIC REPAIR ACL Left 1976   BASAL CELL CARCINOMA EXCISION     face   BLADDER SUSPENSION     BREAST DUCTAL SYSTEM EXCISION Right 01/15/2014   Procedure: EXCISION DUCTAL SYSTEM RIGHT BREAST;  Surgeon: Almond Lint, MD;  Location: [facility];  Service: General;  Laterality: Right;   BREAST EXCISIONAL BIOPSY Right    CARPAL TUNNEL RELEASE Bilateral    B, mult times   CATARACT EXTRACTION     bilateral   cervical neck ablation     x 7, C3-C6/3 screws and plate   CESAREAN SECTION     CYSTOCELE REPAIR     DILATION AND CURETTAGE OF UTERUS     duptyren's contracture right hand     FINGER ARTHRODESIS Right 05/06/2015   Procedure: RIGHT RING PROXIMAL INTERPHALANGEAL FUSION (PIP);  Surgeon: Betha Loa, MD;  Location: [facility];  Service: Orthopedics;  Laterality: Right;   FINGER GANGLION CYST EXCISION  right   HEMICOLECTOMY Right    JOINT REPLACEMENT     right and left basal joints of thumbs   left finger fusion     3 fingers on left hand/one finger right   left ovary and tube removed     NASAL POLYP SURGERY     4 sinus surgeries   NASAL SEPTUM SURGERY     PANNICULECTOMY     PROXIMAL INTERPHALANGEAL FUSION (PIP) Left 09/09/2013   Procedure: FUSION LEFT INDEX PROXIMAL INTERPHALANGEAL JOINT (PIP);  Surgeon: Wyn Forster., MD;  Location: [facility];  Service: Orthopedics;  Laterality: Left;   right median nerve decompression     SHOULDER ARTHROSCOPY     x2 left, 1 right   sinus surgeries     x 4   squamous lesions removed     neck and face   STERIOD INJECTION Right 05/06/2015   Procedure: STEROID INJECTION;  Surgeon: Betha Loa, MD;  Location:  [facility];  Service: Orthopedics;  Laterality: Right;  Right Index Finger Proximal InterPhalangeal Joint Injection   TONSILLECTOMY AND ADENOIDECTOMY     TUBAL LIGATION     vocal polyps removed       Current Outpatient Medications:    albuterol (PROVENTIL) (2.5 MG/3ML) 0.083% nebulizer solution, USE 1 VIAL IN NEBULIZER 4 TIMES DAILY, Disp: , Rfl:    amLODipine (NORVASC) 10 MG tablet, TAKE 1 TABLET(10 MG) BY MOUTH DAILY, Disp: 90 tablet, Rfl: 3   arformoterol (BROVANA) 15 MCG/2ML NEBU, USE 1 VIAL IN NEBULIZER TWICE DAILY, Disp: , Rfl:    Ascorbic Acid (VITAMIN C PO), Take by mouth., Disp: , Rfl:    atorvastatin (LIPITOR) 40 MG tablet, TAKE 1 TABLET BY MOUTH EVERY NIGHT AT BEDTIME, Disp: 90 tablet, Rfl: 3   B-D TB SYRINGE 1CC/27GX1/2" 27G X 1/2" 1 ML MISC, USE 2 APPLICATIONS ROUTE ONCE A WEEK., Disp: , Rfl:    budesonide (PULMICORT) 0.25 MG/2ML nebulizer solution, Take by nebulization 2 (two) times daily., Disp: , Rfl:    CALCIUM PO, Take 600 mg by mouth daily., Disp: , Rfl:    carbamazepine (TEGRETOL XR) 100 MG 12 hr tablet, Take by mouth. 2 tablets daily, Disp: , Rfl:    Cholecalciferol 25 MCG (1000 UT) capsule, Take by mouth., Disp: , Rfl:    Cyanocobalamin (B-12 PO), Take by mouth daily., Disp: , Rfl:    Docusate Sodium (COLACE PO), Take 2 tablets by mouth daily., Disp: , Rfl:    escitalopram (LEXAPRO) 5 MG tablet, Take 1 tablet (5 mg total) by mouth daily. (Patient taking differently: Take 5 mg by mouth daily.), Disp: 30 tablet, Rfl: 1   gabapentin (NEURONTIN) 300 MG capsule, Take 300 mg by mouth at bedtime., Disp: , Rfl:    levothyroxine (SYNTHROID) 50 MCG tablet, TAKE 1 TABLET(50 MCG) BY MOUTH DAILY BEFORE BREAKFAST (Patient taking differently: Take 25 mcg by mouth daily. TAKE 1 TABLET(50 MCG) BY MOUTH DAILY BEFORE BREAKFAST), Disp: 90 tablet, Rfl: 3   mometasone (NASONEX) 50 MCG/ACT nasal spray, Place 2 sprays into the nose daily., Disp: , Rfl:    montelukast (SINGULAIR)  10 MG tablet, TAKE 1 TABLET(10 MG) BY MOUTH AT BEDTIME, Disp: 90 tablet, Rfl: 3   PHENobarbital (LUMINAL) 32.4 MG tablet, TAKE 1 TABLET BY MOUTH AT 6AM AND 2 TABLETS BY MOUTH AT 8 PM, Disp: 270 tablet, Rfl: 1   predniSONE (DELTASONE) 5 MG tablet, Take 15 mg by mouth daily., Disp: , Rfl:  VENTOLIN HFA 108 (90 Base) MCG/ACT inhaler, SMARTSIG:1 Puff(s) Via Inhaler Every 4 Hours PRN, Disp: , Rfl:   Allergies  Allergen Reactions   Iodinated Contrast Media Anaphylaxis    Cardiac arrest   Iodine Other (See Comments)    Cardiac arrest As young adult, received iodinated contrast for IVP, went into cardiac arrest, was resuscitated and hospitalized. Has not received iodinated contrast since that episode. Reports no adverse reaction to betadine.    Metrizamide Other (See Comments)    Cardiac arrest     Morphine Other (See Comments)    Cardiac arrest Patient has previously tolerated hydrocodone, hydromorphone and fentanyl    Morphine And Codeine Other (See Comments)    Cardiac arrest   Cocklebur    Lambs Quarters    Mugwort    Sheep Sorrel    Spiny Pigweed (Amaranthus Spinosus) Skin Test    ROS neg/noncontributory except as noted HPI/below      Objective:     BP 121/71   Pulse 81   Temp (!) 97.5 F (36.4 C) (Temporal)   Resp 16   Ht 5\' 2"  (1.575 m)   Wt 131 lb 8 oz (59.6 kg)   SpO2 95%   BMI 24.05 kg/m  Wt Readings from Last 3 Encounters:  04/04/23 131 lb 8 oz (59.6 kg)  03/01/23 129 lb 8 oz (58.7 kg)  02/15/23 128 lb (58.1 kg)    Physical Exam   Gen: WDWN NAD HEENT: NCAT, conjunctiva not injected, sclera nonicteric EXT:  no edema MSK: no gross abnormalities. +walker NEURO: A&O x3.  CN II-XII intact.  PSYCH: normal mood. Good eye contact     Assessment & Plan:  There are no diagnoses linked to this encounter.  No follow-ups on file.  Melina Fiddler N Rice,acting as a scribe for Angelena Sole, MD.,have documented all relevant documentation on the behalf of Angelena Sole,  MD,as directed by  Angelena Sole, MD while in the presence of Angelena Sole, MD.  Juleen Starr, have reviewed all documentation for this visit. The documentation on 04/04/23 for the exam, diagnosis, procedures, and orders are all accurate and complete. ***  Anaiya N Rice

## 2023-04-05 ENCOUNTER — Inpatient Hospital Stay: Payer: Medicare Other | Admitting: Hematology & Oncology

## 2023-04-05 ENCOUNTER — Inpatient Hospital Stay: Payer: Medicare Other | Attending: Hematology & Oncology

## 2023-04-05 ENCOUNTER — Encounter: Payer: Self-pay | Admitting: Family Medicine

## 2023-04-05 ENCOUNTER — Encounter: Payer: Self-pay | Admitting: Hematology & Oncology

## 2023-04-05 VITALS — BP 125/52 | HR 77 | Temp 98.1°F | Resp 20 | Ht 62.0 in | Wt 131.1 lb

## 2023-04-05 DIAGNOSIS — Z862 Personal history of diseases of the blood and blood-forming organs and certain disorders involving the immune mechanism: Secondary | ICD-10-CM | POA: Diagnosis not present

## 2023-04-05 DIAGNOSIS — Z87891 Personal history of nicotine dependence: Secondary | ICD-10-CM | POA: Diagnosis not present

## 2023-04-05 DIAGNOSIS — K921 Melena: Secondary | ICD-10-CM | POA: Diagnosis not present

## 2023-04-05 DIAGNOSIS — D751 Secondary polycythemia: Secondary | ICD-10-CM | POA: Diagnosis not present

## 2023-04-05 DIAGNOSIS — D582 Other hemoglobinopathies: Secondary | ICD-10-CM

## 2023-04-05 DIAGNOSIS — R638 Other symptoms and signs concerning food and fluid intake: Secondary | ICD-10-CM | POA: Diagnosis not present

## 2023-04-05 DIAGNOSIS — R7871 Abnormal lead level in blood: Secondary | ICD-10-CM

## 2023-04-05 DIAGNOSIS — Z7952 Long term (current) use of systemic steroids: Secondary | ICD-10-CM | POA: Insufficient documentation

## 2023-04-05 LAB — CMP (CANCER CENTER ONLY)
ALT: 19 U/L (ref 0–44)
AST: 27 U/L (ref 15–41)
Albumin: 4.3 g/dL (ref 3.5–5.0)
Alkaline Phosphatase: 62 U/L (ref 38–126)
Anion gap: 5 (ref 5–15)
BUN: 28 mg/dL — ABNORMAL HIGH (ref 8–23)
CO2: 32 mmol/L (ref 22–32)
Calcium: 9.6 mg/dL (ref 8.9–10.3)
Chloride: 107 mmol/L (ref 98–111)
Creatinine: 1.04 mg/dL — ABNORMAL HIGH (ref 0.44–1.00)
GFR, Estimated: 52 mL/min — ABNORMAL LOW (ref >60)
Glucose, Bld: 93 mg/dL (ref 70–99)
Potassium: 4.8 mmol/L (ref 3.5–5.1)
Sodium: 144 mmol/L (ref 135–145)
Total Bilirubin: 0.4 mg/dL (ref <1.2)
Total Protein: 6.8 g/dL (ref 6.5–8.1)

## 2023-04-05 LAB — CBC WITH DIFFERENTIAL (CANCER CENTER ONLY)
Abs Immature Granulocytes: 0.01 10*3/uL (ref 0.00–0.07)
Basophils Absolute: 0.1 10*3/uL (ref 0.0–0.1)
Basophils Relative: 1 %
Eosinophils Absolute: 0 10*3/uL (ref 0.0–0.5)
Eosinophils Relative: 1 %
HCT: 43.2 % (ref 36.0–46.0)
Hemoglobin: 14.3 g/dL (ref 12.0–15.0)
Immature Granulocytes: 0 %
Lymphocytes Relative: 26 %
Lymphs Abs: 1.3 10*3/uL (ref 0.7–4.0)
MCH: 31 pg (ref 26.0–34.0)
MCHC: 33.1 g/dL (ref 30.0–36.0)
MCV: 93.7 fL (ref 80.0–100.0)
Monocytes Absolute: 0.4 10*3/uL (ref 0.1–1.0)
Monocytes Relative: 8 %
Neutro Abs: 3.2 10*3/uL (ref 1.7–7.7)
Neutrophils Relative %: 64 %
Platelet Count: 188 10*3/uL (ref 150–400)
RBC: 4.61 MIL/uL (ref 3.87–5.11)
RDW: 13 % (ref 11.5–15.5)
WBC Count: 5.1 10*3/uL (ref 4.0–10.5)
nRBC: 0 % (ref 0.0–0.2)

## 2023-04-05 LAB — IRON AND IRON BINDING CAPACITY (CC-WL,HP ONLY)
Iron: 140 ug/dL (ref 28–170)
Saturation Ratios: 46 % — ABNORMAL HIGH (ref 10.4–31.8)
TIBC: 305 ug/dL (ref 250–450)
UIBC: 165 ug/dL (ref 148–442)

## 2023-04-05 LAB — FERRITIN: Ferritin: 47 ng/mL (ref 11–307)

## 2023-04-05 NOTE — Assessment & Plan Note (Signed)
Patient is at high risk for falls.  She has osteoporosis.  Will need treatment

## 2023-04-05 NOTE — Assessment & Plan Note (Signed)
>>  ASSESSMENT AND PLAN FOR GASTROPARESIS WRITTEN ON 04/05/2023  4:04 AM BY KULIK, ANN MARIE, MD  History of.  I would be hesitant to do oral bisphosphonates for her osteoporosis

## 2023-04-05 NOTE — Assessment & Plan Note (Signed)
Postmenopausal/age-related osteoporosis.  Patient is on chronic steroid use of at least 15 mg daily.  She has very high risk for falls due to demyelinating disease.  Also is on multiple medications that can contribute.  I believe her spine is falsely elevated due to her posture, etc.  She has a history of GERD and gastroparesis, so would like to avoid oral bisphosphonates.  Given all of the above.  I think she is an excellent candidate for Evenity monthly injections for 1 year followed by Prolia.

## 2023-04-05 NOTE — Patient Instructions (Signed)
Will check on meds for her.

## 2023-04-05 NOTE — Assessment & Plan Note (Signed)
Chronic.  Controlled.  Followed by [facility] neurology as well.  Continue Tegretol-XR 200 mg daily and phenytoin 32.4 mg in the a.m. and 64.8 mg in the p.m.

## 2023-04-05 NOTE — Assessment & Plan Note (Signed)
Chronic. 

## 2023-04-05 NOTE — Assessment & Plan Note (Signed)
History of.  I would be hesitant to do oral bisphosphonates for her osteoporosis

## 2023-04-05 NOTE — Progress Notes (Signed)
Hematology and Oncology Follow Up Visit  [name] [id number] 1932/10/11 89 y.o. 04/05/2023   Principle Diagnosis:  Transient erythrocytosis - JAK2 (-)  Current Therapy:   Observation     Interim History:  [name] is back for follow-up.  We last saw her back on 02/15/2023.  At that time, she has some slight erythrocytosis.  We did do a workup.  Her JAK2 mutation was negative.  She had normal iron studies.  Her ferritin was 54 with an iron saturation of 35%.  The erythropoietin level was 9.5.  Her vitamin B12 was on the higher side at 1145.  There is little bit a concern with her stools.  She said that there was some black stool.  However, we did a rectal exam on her.  Rectal exam did not show any blood in the stool.  She still has a neurological issues.  This is unchanged.  She has had no cough or shortness of breath.  She has had no nausea or vomiting.  There has been no leg swelling.  She has had no fever.  She has had no obvious bleeding.  Overall, I would say that her performance status is ECOG 1.   Medications:  Current Outpatient Medications:    amLODipine (NORVASC) 10 MG tablet, TAKE 1 TABLET(10 MG) BY MOUTH DAILY, Disp: 90 tablet, Rfl: 3   arformoterol (BROVANA) 15 MCG/2ML NEBU, USE 1 VIAL IN NEBULIZER TWICE DAILY, Disp: , Rfl:    atorvastatin (LIPITOR) 40 MG tablet, TAKE 1 TABLET BY MOUTH EVERY NIGHT AT BEDTIME, Disp: 90 tablet, Rfl: 3   B-D TB SYRINGE 1CC/27GX1/2" 27G X 1/2" 1 ML MISC, USE 2 APPLICATIONS ROUTE ONCE A WEEK., Disp: , Rfl:    budesonide (PULMICORT) 0.25 MG/2ML nebulizer solution, Take by nebulization 2 (two) times daily., Disp: , Rfl:    CALCIUM PO, Take 600 mg by mouth daily., Disp: , Rfl:    carbamazepine (TEGRETOL XR) 100 MG 12 hr tablet, Take by mouth. 2 tablets daily, Disp: , Rfl:    Cholecalciferol 25 MCG (1000 UT) capsule, Take 1,000 Units by mouth daily., Disp: , Rfl:    Cyanocobalamin (B-12 PO), Take by mouth daily., Disp: , Rfl:     Docusate Sodium (COLACE PO), Take 1 tablet by mouth daily., Disp: , Rfl:    escitalopram (LEXAPRO) 5 MG tablet, Take 1 tablet (5 mg total) by mouth daily. (Patient taking differently: Take 5 mg by mouth daily.), Disp: 30 tablet, Rfl: 1   gabapentin (NEURONTIN) 300 MG capsule, Take 300 mg by mouth at bedtime., Disp: , Rfl:    levothyroxine (SYNTHROID) 50 MCG tablet, TAKE 1 TABLET(50 MCG) BY MOUTH DAILY BEFORE BREAKFAST (Patient taking differently: Take 25 mcg by mouth daily. TAKE 1 TABLET(50 MCG) BY MOUTH DAILY BEFORE BREAKFAST), Disp: 90 tablet, Rfl: 3   montelukast (SINGULAIR) 10 MG tablet, TAKE 1 TABLET(10 MG) BY MOUTH AT BEDTIME, Disp: 90 tablet, Rfl: 3   PHENobarbital (LUMINAL) 32.4 MG tablet, TAKE 1 TABLET BY MOUTH AT 6AM AND 2 TABLETS BY MOUTH AT 8 PM, Disp: 270 tablet, Rfl: 1   predniSONE (DELTASONE) 5 MG tablet, Take 15 mg by mouth daily., Disp: , Rfl:    VENTOLIN HFA 108 (90 Base) MCG/ACT inhaler, SMARTSIG:1 Puff(s) Via Inhaler Every 4 Hours PRN, Disp: , Rfl:    albuterol (PROVENTIL) (2.5 MG/3ML) 0.083% nebulizer solution, USE 1 VIAL IN NEBULIZER 4 TIMES DAILY (Patient not taking: Reported on 04/05/2023), Disp: , Rfl:    mometasone (NASONEX)  50 MCG/ACT nasal spray, Place 2 sprays into the nose daily. (Patient not taking: Reported on 04/05/2023), Disp: , Rfl:   Allergies:  Allergies  Allergen Reactions   Iodinated Contrast Media Anaphylaxis    Cardiac arrest   Iodine Other (See Comments)    Cardiac arrest As young adult, received iodinated contrast for IVP, went into cardiac arrest, was resuscitated and hospitalized. Has not received iodinated contrast since that episode. Reports no adverse reaction to betadine.    Metrizamide Other (See Comments)    Cardiac arrest     Morphine Other (See Comments)    Cardiac arrest Patient has previously tolerated hydrocodone, hydromorphone and fentanyl    Morphine And Codeine Other (See Comments)    Cardiac arrest   Cocklebur    Lambs Quarters     Mugwort    Sheep Sorrel    Spiny Pigweed (Amaranthus Spinosus) Skin Test     Past Medical History, Surgical history, Social history, and Family History were reviewed and updated.  Review of Systems: Review of Systems  Constitutional: Negative.  Negative for appetite change, fatigue, fever and unexpected weight change.  HENT:  Negative.  Negative for lump/mass, mouth sores, sore throat and trouble swallowing.   Eyes: Negative.   Respiratory: Negative.  Negative for cough, hemoptysis and shortness of breath.   Cardiovascular: Negative.  Negative for leg swelling and palpitations.  Gastrointestinal:  Positive for diarrhea. Negative for abdominal distention, abdominal pain, blood in stool, constipation, nausea and vomiting.  Endocrine: Negative.   Genitourinary: Negative.  Negative for bladder incontinence, dysuria, frequency and hematuria.   Musculoskeletal:  Positive for gait problem. Negative for arthralgias, back pain and myalgias.  Skin: Negative.  Negative for itching and rash.  Neurological:  Positive for extremity weakness and gait problem. Negative for dizziness, headaches, numbness, seizures and speech difficulty.  Hematological: Negative.  Does not bruise/bleed easily.  Psychiatric/Behavioral: Negative.  Negative for depression and sleep disturbance. The patient is not nervous/anxious.     Physical Exam:  height is 5\' 2"  (1.575 m) and weight is 131 lb 1.3 oz (59.5 kg). Her oral temperature is 98.1 F (36.7 C). Her blood pressure is 125/52 (abnormal) and her pulse is 77. Her respiration is 20 and oxygen saturation is 98%.   Wt Readings from Last 3 Encounters:  04/05/23 131 lb 1.3 oz (59.5 kg)  04/04/23 131 lb 8 oz (59.6 kg)  03/01/23 129 lb 8 oz (58.7 kg)    Physical Exam Vitals reviewed.  HENT:     Head: Normocephalic and atraumatic.  Eyes:     Pupils: Pupils are equal, round, and reactive to light.  Cardiovascular:     Rate and Rhythm: Normal rate and regular  rhythm.     Heart sounds: Normal heart sounds.  Pulmonary:     Effort: Pulmonary effort is normal.     Breath sounds: Normal breath sounds.  Abdominal:     General: Bowel sounds are normal.     Palpations: Abdomen is soft.  Musculoskeletal:        General: No tenderness or deformity. Normal range of motion.     Cervical back: Normal range of motion.  Lymphadenopathy:     Cervical: No cervical adenopathy.  Skin:    General: Skin is warm and dry.     Findings: No erythema or rash.  Neurological:     Mental Status: She is alert and oriented to person, place, and time.  Psychiatric:        Behavior:  Behavior normal.        Thought Content: Thought content normal.        Judgment: Judgment normal.      Lab Results  Component Value Date   WBC 5.1 04/05/2023   HGB 14.3 04/05/2023   HCT 43.2 04/05/2023   MCV 93.7 04/05/2023   PLT 188 04/05/2023     Chemistry      Component Value Date/Time   NA 144 04/05/2023 1143   K 4.8 04/05/2023 1143   CL 107 04/05/2023 1143   CO2 32 04/05/2023 1143   BUN 28 (H) 04/05/2023 1143   CREATININE 1.04 (H) 04/05/2023 1143   CREATININE 0.78 06/27/2012 1139      Component Value Date/Time   CALCIUM 9.6 04/05/2023 1143   ALKPHOS 62 04/05/2023 1143   AST 27 04/05/2023 1143   ALT 19 04/05/2023 1143   BILITOT 0.4 04/05/2023 1143      Impression and Plan: Ms. Savino is a very charming 89 year old white female.  She had transient erythrocytosis.  So far, her testing really has not been positive for any thing that looks like a myeloproliferative disorder.  For right now, we will just follow her along.  We will go ahead and plan to get her back after the Holiday season.  Am glad that we did do the rectal exam on her to make sure that there was no problems with GI bleeding.  As always, it is a lot of fun to talk to her and her husband.   Josph Macho, MD 11/7/202412:44 PM

## 2023-04-10 ENCOUNTER — Ambulatory Visit: Payer: Medicare Other | Admitting: Nurse Practitioner

## 2023-04-24 DIAGNOSIS — M349 Systemic sclerosis, unspecified: Secondary | ICD-10-CM | POA: Diagnosis not present

## 2023-04-27 DIAGNOSIS — T189XXA Foreign body of alimentary tract, part unspecified, initial encounter: Secondary | ICD-10-CM | POA: Diagnosis not present

## 2023-04-27 DIAGNOSIS — T17908A Unspecified foreign body in respiratory tract, part unspecified causing other injury, initial encounter: Secondary | ICD-10-CM | POA: Diagnosis not present

## 2023-04-27 DIAGNOSIS — R100 Acute abdomen: Secondary | ICD-10-CM | POA: Diagnosis not present

## 2023-04-27 DIAGNOSIS — T182XXA Foreign body in stomach, initial encounter: Secondary | ICD-10-CM | POA: Diagnosis not present

## 2023-04-30 ENCOUNTER — Ambulatory Visit: Payer: Self-pay | Admitting: Licensed Clinical Social Worker

## 2023-05-01 NOTE — Patient Instructions (Signed)
Visit Information  Thank you for taking time to visit with me today. Please don't hesitate to contact me if I can be of assistance to you.   Following are the goals we discussed today:   Goals Addressed             This Visit's Progress    COMPLETED: Management of MH Symptoms   On track    Activities and task to complete in order to accomplish goals.   Keep all upcoming appointments discussed today Continue with compliance of taking medication prescribed by Doctor Implement healthy coping skills discussed to assist with management of symptoms         Please call the care guide team at [phone] if you need to cancel or reschedule your appointment.   If you are experiencing a Mental Health or Behavioral Health Crisis or need someone to talk to, please call the Suicide and Crisis Lifeline: 988 call 911   Patient verbalizes understanding of instructions and care plan provided today and agrees to view in MyChart. Active MyChart status and patient understanding of how to access instructions and care plan via MyChart confirmed with patient.     No further follow up required: Pt agreed to f/up with PCP if any additional needs arise  Jenel Lucks, MSW, LCSW [facility] Care Management [facility]  [facility] [email or web address] Phone [phone] 8:38 AM

## 2023-05-01 NOTE — Patient Outreach (Signed)
  Care Coordination   Follow Up Visit Note   04/30/2023 Name: Laiana Baierl MRN: [id number] DOB: 1932/12/07  Normalee Fickes is a 89 y.o. year old female who sees Jeani Sow, MD for primary care. I spoke with  Lorenda Hatchet by phone today.  What matters to the patients health and wellness today?  Symptom Management    Goals Addressed             This Visit's Progress    COMPLETED: Management of MH Symptoms   On track    Activities and task to complete in order to accomplish goals.   Keep all upcoming appointments discussed today Continue with compliance of taking medication prescribed by Doctor Implement healthy coping skills discussed to assist with management of symptoms         SDOH assessments and interventions completed:  No     Care Coordination Interventions:  Yes, provided  Interventions Today    Flowsheet Row Most Recent Value  Chronic Disease   Chronic disease during today's visit Chronic Obstructive Pulmonary Disease (COPD), Other  [Depression]  General Interventions   General Interventions Discussed/Reviewed General Interventions Reviewed, Doctor Visits  [Pt reported going to ED and Pulmonoligist due to increased couging after concerns that she may have swallowed bridge. Testing was completed at specialist, states she left ED after waiting for 4 hrs to be seen. Pt denies any current pain or discomfort.]  Doctor Visits Discussed/Reviewed Doctor Visits Reviewed  Mental Health Interventions   Mental Health Discussed/Reviewed Mental Health Reviewed, Coping Strategies, Depression  [Pt reports decrease in depression symptoms. States she is managing well and completing healthy coping skills as needed]  Nutrition Interventions   Nutrition Discussed/Reviewed Nutrition Reviewed  Pharmacy Interventions   Pharmacy Dicussed/Reviewed Pharmacy Topics Reviewed, Medication Adherence  Safety Interventions   Safety Discussed/Reviewed Safety Reviewed        Follow up plan: No further intervention required.   Encounter Outcome:  Patient Visit Completed   Jenel Lucks, MSW, LCSW [facility] Care Management [facility]  [facility] [email or web address] Phone [phone] 8:38 AM

## 2023-05-03 DIAGNOSIS — K5900 Constipation, unspecified: Secondary | ICD-10-CM | POA: Diagnosis not present

## 2023-05-03 DIAGNOSIS — J449 Chronic obstructive pulmonary disease, unspecified: Secondary | ICD-10-CM | POA: Diagnosis not present

## 2023-05-04 ENCOUNTER — Telehealth: Payer: Self-pay | Admitting: Family Medicine

## 2023-05-04 NOTE — Telephone Encounter (Signed)
Patient called in regards to her Medicare AWV on 11/16/22 that she got a $271 bill. I asked patient if she spoke with the billing department and she couldn't say for certain if she did or not. States she just had a woman call her and state she would have to speak to Dr. Lavone Neri nurse.   Per the note, patient's medicare AWV was signed off by Dr. Jimmey Ralph but PCP is Dr. Ruthine Dose. Please Advise.

## 2023-05-07 ENCOUNTER — Telehealth: Payer: Self-pay | Admitting: Family Medicine

## 2023-05-07 DIAGNOSIS — M810 Age-related osteoporosis without current pathological fracture: Secondary | ICD-10-CM

## 2023-05-07 NOTE — Telephone Encounter (Signed)
Per last office note on 04/04/23 from visit with Dr. Ruthine Dose: I think she is an excellent candidate for Evenity monthly injections for 1 year followed by Prolia.

## 2023-05-07 NOTE — Telephone Encounter (Signed)
I have spoken with patient in regard.  I have sent an email over to coding and the AWV manager to review charge.

## 2023-05-07 NOTE — Telephone Encounter (Signed)
Patient states she was advised by PCP over a month ago that she would start an injectable for osteopenia.   States she is not sure which medication . States PCP was waiting on a prior auth to be approved. [name], can you follow up with patient in regard?

## 2023-05-08 MED ORDER — ROMOSOZUMAB-AQQG 105 MG/1.17ML ~~LOC~~ SOSY
210.0000 mg | PREFILLED_SYRINGE | Freq: Once | SUBCUTANEOUS | Status: AC
  Administered 2023-05-31: 210 mg via SUBCUTANEOUS

## 2023-05-08 NOTE — Telephone Encounter (Signed)
Order placed and went to Pharmacy team for Prior Auth.

## 2023-05-09 NOTE — Telephone Encounter (Signed)
Coding has been updated and submitted to insurance.  I have informed patient to disregard current bill.

## 2023-05-09 NOTE — Telephone Encounter (Signed)
I have notified patient.  

## 2023-05-10 ENCOUNTER — Telehealth: Payer: Self-pay

## 2023-05-10 DIAGNOSIS — J449 Chronic obstructive pulmonary disease, unspecified: Secondary | ICD-10-CM | POA: Diagnosis not present

## 2023-05-10 DIAGNOSIS — J209 Acute bronchitis, unspecified: Secondary | ICD-10-CM | POA: Diagnosis not present

## 2023-05-10 DIAGNOSIS — K5900 Constipation, unspecified: Secondary | ICD-10-CM | POA: Diagnosis not present

## 2023-05-10 DIAGNOSIS — R9389 Abnormal findings on diagnostic imaging of other specified body structures: Secondary | ICD-10-CM | POA: Diagnosis not present

## 2023-05-10 NOTE — Telephone Encounter (Signed)
Pt ready for scheduling for Evenity on or after : 05/22/23  Out-of-pocket cost due at time of visit: $0  Primary: [place] Medicare Evenity co-insurance: 0% Admin fee co-insurance: 0%  Secondary: [employer] [place] [employer] Prolia co-insurance: 100% Admin fee co-insurance: 100%  Medical Benefit Details: Date Benefits were checked: 05/10/23 Deductible: $240 (met)/ Coinsurance: 100%/ Admin Fee: 100%  Prior Auth: not required PA# Expiration Date:  # of doses approved:  Pharmacy benefit: Copay $-- If patient wants fill through the pharmacy benefit please send prescription to:  -- , and include estimated need by date in rx notes. Pharmacy will ship medication directly to the office.  Patient not eligible for Evenity Copay Card. Copay Card can make patient's cost as little as $25. Link to apply: https://www.amgensupportplus.com/copay  ** This summary of benefits is an estimation of the patient's out-of-pocket cost. Exact cost may very based on individual plan coverage.

## 2023-05-14 DIAGNOSIS — R9389 Abnormal findings on diagnostic imaging of other specified body structures: Secondary | ICD-10-CM | POA: Diagnosis not present

## 2023-05-24 NOTE — Telephone Encounter (Signed)
Patient scheduled for 05/31/23

## 2023-05-28 DIAGNOSIS — G40309 Generalized idiopathic epilepsy and epileptic syndromes, not intractable, without status epilepticus: Secondary | ICD-10-CM | POA: Diagnosis not present

## 2023-05-28 DIAGNOSIS — K5900 Constipation, unspecified: Secondary | ICD-10-CM | POA: Diagnosis not present

## 2023-05-28 DIAGNOSIS — J209 Acute bronchitis, unspecified: Secondary | ICD-10-CM | POA: Diagnosis not present

## 2023-05-28 DIAGNOSIS — J449 Chronic obstructive pulmonary disease, unspecified: Secondary | ICD-10-CM | POA: Diagnosis not present

## 2023-05-31 ENCOUNTER — Ambulatory Visit: Payer: Medicare Other

## 2023-05-31 ENCOUNTER — Telehealth: Payer: Self-pay

## 2023-05-31 ENCOUNTER — Ambulatory Visit (INDEPENDENT_AMBULATORY_CARE_PROVIDER_SITE_OTHER): Payer: Medicare Other

## 2023-05-31 DIAGNOSIS — M810 Age-related osteoporosis without current pathological fracture: Secondary | ICD-10-CM | POA: Diagnosis not present

## 2023-05-31 MED ORDER — ROMOSOZUMAB-AQQG 105 MG/1.17ML ~~LOC~~ SOSY: 210.0000 mg | PREFILLED_SYRINGE | SUBCUTANEOUS | Status: DC

## 2023-05-31 NOTE — Progress Notes (Addendum)
 Per orders of Dr. Lutricia Horsfall, injection of  Evenity  given by Dorris Fetch in bilaterally subcutaneus deltoid. Patient tolerated injection well. Patient will make appointment for 1 month.

## 2023-05-31 NOTE — Telephone Encounter (Signed)
 Evenity VOB initiated via [email or web address]  Last Evenity inj: 05/31/23 Next Evenity inj DUE: 06/30/23

## 2023-06-01 NOTE — Telephone Encounter (Signed)
 [name]

## 2023-06-01 NOTE — Telephone Encounter (Signed)
 Pt ready for scheduling for EVENITY  on or after : 06/30/23  Out-of-pocket cost due at time of visit: $0  Number of injection/visits approved: ---  Primary: MEDICARE Prolia  co-insurance: 0% Admin fee co-insurance: 0%  Secondary: [place] Prolia  co-insurance: Covers the Medicare Part B deductible, co-insurance and 100% of the excess charges Admin fee co-insurance:   Medical Benefit Details: Date Benefits were checked: 05/31/23 Deductible: $0 Met of $257 Required/ Coinsurance: 0%/ Admin Fee: 0%  Prior Auth: N/A PA# Expiration Date:  # of doses approved:  Pharmacy benefit: Copay $--- If patient wants fill through the pharmacy benefit please send prescription to:  --- , and include estimated need by date in rx notes. Pharmacy will ship medication directly to the office.  Patient NOT eligible for Evenity  Copay Card. Copay Card can make patient's cost as little as $25. Link to apply: https://www.amgensupportplus.com/copay  ** This summary of benefits is an estimation of the patient's out-of-pocket cost. Exact cost may very based on individual plan coverage.

## 2023-06-07 ENCOUNTER — Inpatient Hospital Stay: Payer: Medicare Other | Attending: Hematology & Oncology

## 2023-06-07 ENCOUNTER — Inpatient Hospital Stay (HOSPITAL_BASED_OUTPATIENT_CLINIC_OR_DEPARTMENT_OTHER): Payer: Medicare Other | Admitting: Hematology & Oncology

## 2023-06-07 ENCOUNTER — Encounter: Payer: Self-pay | Admitting: Hematology & Oncology

## 2023-06-07 VITALS — BP 119/78 | HR 90 | Temp 98.9°F | Resp 19 | Ht 62.0 in | Wt 136.0 lb

## 2023-06-07 DIAGNOSIS — R638 Other symptoms and signs concerning food and fluid intake: Secondary | ICD-10-CM

## 2023-06-07 DIAGNOSIS — R799 Abnormal finding of blood chemistry, unspecified: Secondary | ICD-10-CM | POA: Diagnosis not present

## 2023-06-07 DIAGNOSIS — R5383 Other fatigue: Secondary | ICD-10-CM | POA: Insufficient documentation

## 2023-06-07 DIAGNOSIS — D582 Other hemoglobinopathies: Secondary | ICD-10-CM | POA: Diagnosis not present

## 2023-06-07 DIAGNOSIS — Z87891 Personal history of nicotine dependence: Secondary | ICD-10-CM | POA: Diagnosis not present

## 2023-06-07 DIAGNOSIS — Z862 Personal history of diseases of the blood and blood-forming organs and certain disorders involving the immune mechanism: Secondary | ICD-10-CM | POA: Diagnosis not present

## 2023-06-07 DIAGNOSIS — D751 Secondary polycythemia: Secondary | ICD-10-CM | POA: Insufficient documentation

## 2023-06-07 LAB — CBC WITH DIFFERENTIAL (CANCER CENTER ONLY)
Abs Immature Granulocytes: 0.03 10*3/uL (ref 0.00–0.07)
Basophils Absolute: 0 10*3/uL (ref 0.0–0.1)
Basophils Relative: 1 %
Eosinophils Absolute: 0.1 10*3/uL (ref 0.0–0.5)
Eosinophils Relative: 1 %
HCT: 43.6 % (ref 36.0–46.0)
Hemoglobin: 14 g/dL (ref 12.0–15.0)
Immature Granulocytes: 0 %
Lymphocytes Relative: 17 %
Lymphs Abs: 1.2 10*3/uL (ref 0.7–4.0)
MCH: 30.6 pg (ref 26.0–34.0)
MCHC: 32.1 g/dL (ref 30.0–36.0)
MCV: 95.4 fL (ref 80.0–100.0)
Monocytes Absolute: 0.3 10*3/uL (ref 0.1–1.0)
Monocytes Relative: 4 %
Neutro Abs: 5.4 10*3/uL (ref 1.7–7.7)
Neutrophils Relative %: 77 %
Platelet Count: 234 10*3/uL (ref 150–400)
RBC: 4.57 MIL/uL (ref 3.87–5.11)
RDW: 14.2 % (ref 11.5–15.5)
WBC Count: 7 10*3/uL (ref 4.0–10.5)
nRBC: 0 % (ref 0.0–0.2)

## 2023-06-07 LAB — RETICULOCYTES
Immature Retic Fract: 3.6 % (ref 2.3–15.9)
RBC.: 4.49 MIL/uL (ref 3.87–5.11)
Retic Count, Absolute: 54.3 10*3/uL (ref 19.0–186.0)
Retic Ct Pct: 1.2 % (ref 0.4–3.1)

## 2023-06-07 LAB — CMP (CANCER CENTER ONLY)
ALT: 17 U/L (ref 0–44)
AST: 23 U/L (ref 15–41)
Albumin: 4.1 g/dL (ref 3.5–5.0)
Alkaline Phosphatase: 97 U/L (ref 38–126)
Anion gap: 8 (ref 5–15)
BUN: 38 mg/dL — ABNORMAL HIGH (ref 8–23)
CO2: 29 mmol/L (ref 22–32)
Calcium: 9.3 mg/dL (ref 8.9–10.3)
Chloride: 111 mmol/L (ref 98–111)
Creatinine: 0.96 mg/dL (ref 0.44–1.00)
GFR, Estimated: 57 mL/min — ABNORMAL LOW (ref >60)
Glucose, Bld: 127 mg/dL — ABNORMAL HIGH (ref 70–99)
Potassium: 4.6 mmol/L (ref 3.5–5.1)
Sodium: 148 mmol/L — ABNORMAL HIGH (ref 135–145)
Total Bilirubin: 0.3 mg/dL (ref 0.0–1.2)
Total Protein: 6.9 g/dL (ref 6.5–8.1)

## 2023-06-07 LAB — IRON AND IRON BINDING CAPACITY (CC-WL,HP ONLY)
Iron: 86 ug/dL (ref 28–170)
Saturation Ratios: 27 % (ref 10.4–31.8)
TIBC: 318 ug/dL (ref 250–450)
UIBC: 232 ug/dL (ref 148–442)

## 2023-06-07 LAB — FERRITIN: Ferritin: 60 ng/mL (ref 11–307)

## 2023-06-07 LAB — LACTATE DEHYDROGENASE: LDH: 205 U/L — ABNORMAL HIGH (ref 98–192)

## 2023-06-07 NOTE — Progress Notes (Signed)
 Hematology and Oncology Follow Up Visit  [name] [id number] 09/04/1932 89 y.o. 06/07/2023   Principle Diagnosis:  Transient erythrocytosis - JAK2 (-)  Current Therapy:   Observation     Interim History:  [name] is back for follow-up.  We last saw [name] back in November.  Since then, she has been quite busy.  She actually had to go to the emergency room.  There is some concern about [name] swallowing [name] dentures.  He does not look like this happen.  She did however seem to have a bowel obstruction.  She did not require any type of NG tube.  It sounds like they got this better and just with laxatives.  [name] last iron studies that we did back in November showed a ferritin of 47 with an iron saturation of 46%.  She does feel tired right.  Again we will see what [name] iron studies look like.  She has had no obvious bleeding.  There is been no rashes.  She has had no leg swelling.  She has had no nausea or vomiting.  She is on steroids.  This may have been for [name] underlying lung issues.  She is on I think 20 mg a day.  Overall, I would say that [name] performance status is probably ECOG 2.    Medications:  Current Outpatient Medications:    amLODipine  (NORVASC ) 10 MG tablet, TAKE 1 TABLET(10 MG) BY MOUTH DAILY, Disp: 90 tablet, Rfl: 3   arformoterol  (BROVANA ) 15 MCG/2ML NEBU, USE 1 VIAL IN NEBULIZER TWICE DAILY, Disp: , Rfl:    atorvastatin  (LIPITOR) 40 MG tablet, TAKE 1 TABLET BY MOUTH EVERY NIGHT AT BEDTIME, Disp: 90 tablet, Rfl: 3   B-D TB SYRINGE 1CC/27GX1/2 27G X 1/2 1 ML MISC, USE 2 APPLICATIONS ROUTE ONCE A WEEK., Disp: , Rfl:    budesonide  (PULMICORT ) 0.25 MG/2ML nebulizer solution, Take by nebulization 2 (two) times daily., Disp: , Rfl:    CALCIUM  PO, Take 600 mg by mouth daily., Disp: , Rfl:    carbamazepine  (TEGRETOL  XR) 100 MG 12 hr tablet, Take by mouth. 2 tablets daily, Disp: , Rfl:    Cholecalciferol 25 MCG (1000 UT) capsule, Take 1,000 Units by mouth daily.,  Disp: , Rfl:    Cyanocobalamin  (B-12 PO), Take by mouth daily., Disp: , Rfl:    Docusate Sodium  (COLACE PO), Take 1 tablet by mouth daily., Disp: , Rfl:    escitalopram  (LEXAPRO ) 5 MG tablet, Take 1 tablet (5 mg total) by mouth daily. (Patient taking differently: Take 5 mg by mouth daily.), Disp: 30 tablet, Rfl: 1   gabapentin  (NEURONTIN ) 300 MG capsule, Take 300 mg by mouth at bedtime., Disp: , Rfl:    levothyroxine  (SYNTHROID ) 50 MCG tablet, TAKE 1 TABLET(50 MCG) BY MOUTH DAILY BEFORE BREAKFAST (Patient taking differently: Take 25 mcg by mouth daily. TAKE 1 TABLET(50 MCG) BY MOUTH DAILY BEFORE BREAKFAST), Disp: 90 tablet, Rfl: 3   mometasone  (NASONEX ) 50 MCG/ACT nasal spray, Place 2 sprays into the nose daily., Disp: , Rfl:    montelukast  (SINGULAIR ) 10 MG tablet, TAKE 1 TABLET(10 MG) BY MOUTH AT BEDTIME, Disp: 90 tablet, Rfl: 3   PHENobarbital  (LUMINAL) 32.4 MG tablet, TAKE 1 TABLET BY MOUTH AT 6AM AND 2 TABLETS BY MOUTH AT 8 PM, Disp: 270 tablet, Rfl: 1   predniSONE  (DELTASONE ) 5 MG tablet, Take 15 mg by mouth daily., Disp: , Rfl:    VENTOLIN  HFA 108 (90 Base) MCG/ACT inhaler, SMARTSIG:1 Puff(s) Via Inhaler Every  4 Hours PRN, Disp: , Rfl:    albuterol  (PROVENTIL ) (2.5 MG/3ML) 0.083% nebulizer solution, USE 1 VIAL IN NEBULIZER 4 TIMES DAILY (Patient not taking: Reported on 06/07/2023), Disp: , Rfl:   Current Facility-Administered Medications:    [START ON 06/30/2023] Romosozumab -aqqg (EVENITY ) 105 MG/1. injection 210 mg, 210 mg, Subcutaneous, Q30 days, Wendolyn Jenkins Jansky, MD  Allergies:  Allergies  Allergen Reactions   Iodinated Contrast Media Anaphylaxis    Cardiac arrest   Iodine Other (See Comments)    Cardiac arrest As young adult, received iodinated contrast for IVP, went into cardiac arrest, was resuscitated and hospitalized. Has not received iodinated contrast since that episode. Reports no adverse reaction to betadine.    Metrizamide Other (See Comments)    Cardiac arrest      Morphine Other (See Comments)    Cardiac arrest Patient has previously tolerated hydrocodone, hydromorphone  and fentanyl     Morphine And Codeine Other (See Comments)    Cardiac arrest   Cocklebur    Lambs Quarters    Mugwort    Sheep Sorrel    Spiny Pigweed (Amaranthus Spinosus) Skin Test     Past Medical History, Surgical history, Social history, and Family History were reviewed and updated.  Review of Systems: Review of Systems  Constitutional: Negative.  Negative for appetite change, fatigue, fever and unexpected weight change.  HENT:  Negative.  Negative for lump/mass, mouth sores, sore throat and trouble swallowing.   Eyes: Negative.   Respiratory: Negative.  Negative for cough, hemoptysis and shortness of breath.   Cardiovascular: Negative.  Negative for leg swelling and palpitations.  Gastrointestinal:  Positive for diarrhea. Negative for abdominal distention, abdominal pain, blood in stool, constipation, nausea and vomiting.  Endocrine: Negative.   Genitourinary: Negative.  Negative for bladder incontinence, dysuria, frequency and hematuria.   Musculoskeletal:  Positive for gait problem. Negative for arthralgias, back pain and myalgias.  Skin: Negative.  Negative for itching and rash.  Neurological:  Positive for extremity weakness and gait problem. Negative for dizziness, headaches, numbness, seizures and speech difficulty.  Hematological: Negative.  Does not bruise/bleed easily.  Psychiatric/Behavioral: Negative.  Negative for depression and sleep disturbance. The patient is not nervous/anxious.     Physical Exam:  height is 5' 2 (1.575 m) and weight is 136 lb (61.7 kg). [name] oral temperature is 98.9 F (37.2 C). [name] blood pressure is 119/78 and [name] pulse is 90. [name] respiration is 19 and oxygen saturation is 98%.   Wt Readings from Last 3 Encounters:  06/07/23 136 lb (61.7 kg)  04/05/23 131 lb 1.3 oz (59.5 kg)  04/04/23 131 lb 8 oz (59.6 kg)    Physical  Exam Vitals reviewed.  HENT:     Head: Normocephalic and atraumatic.  Eyes:     Pupils: Pupils are equal, round, and reactive to light.  Cardiovascular:     Rate and Rhythm: Normal rate and regular rhythm.     Heart sounds: Normal heart sounds.  Pulmonary:     Effort: Pulmonary effort is normal.     Breath sounds: Normal breath sounds.  Abdominal:     General: Bowel sounds are normal.     Palpations: Abdomen is soft.  Musculoskeletal:        General: No tenderness or deformity. Normal range of motion.     Cervical back: Normal range of motion.  Lymphadenopathy:     Cervical: No cervical adenopathy.  Skin:    General: Skin is warm and dry.  Findings: No erythema or rash.  Neurological:     Mental Status: She is alert and oriented to person, place, and time.  Psychiatric:        Behavior: Behavior normal.        Thought Content: Thought content normal.        Judgment: Judgment normal.      Lab Results  Component Value Date   WBC 7.0 06/07/2023   HGB 14.0 06/07/2023   HCT 43.6 06/07/2023   MCV 95.4 06/07/2023   PLT 234 06/07/2023     Chemistry      Component Value Date/Time   NA 148 (H) 06/07/2023 1155   K 4.6 06/07/2023 1155   CL 111 06/07/2023 1155   CO2 29 06/07/2023 1155   BUN 38 (H) 06/07/2023 1155   CREATININE 0.96 06/07/2023 1155   CREATININE 0.78 06/27/2012 1139      Component Value Date/Time   CALCIUM  9.3 06/07/2023 1155   ALKPHOS 97 06/07/2023 1155   AST 23 06/07/2023 1155   ALT 17 06/07/2023 1155   BILITOT 0.3 06/07/2023 1155      Impression and Plan: [name] is a very charming 89 year old white female.  She had transient erythrocytosis.  So far, [name] testing really has not been positive for any thing that looks like a myeloproliferative disorder.  We will see what [name] iron studies look like.  This will certainly be important for us  I think.  I am glad that she did not swallow [name] dentures.  We will plan to go ahead and get [name] back to  see us  in another couple months.  We will try to get [name] through the Winter.   Maude JONELLE Crease, MD 1/9/202512:54 PM

## 2023-06-08 MED ORDER — ROMOSOZUMAB-AQQG 105 MG/1.17ML ~~LOC~~ SOSY: 210.0000 mg | PREFILLED_SYRINGE | Freq: Once | SUBCUTANEOUS | Status: DC

## 2023-06-08 MED ORDER — ROMOSOZUMAB-AQQG 105 MG/1.17ML ~~LOC~~ SOSY
210.0000 mg | PREFILLED_SYRINGE | Freq: Once | SUBCUTANEOUS | Status: AC
  Administered 2023-05-31: 210 mg via SUBCUTANEOUS

## 2023-06-08 NOTE — Addendum Note (Signed)
 Addended byZoe Lan on: 06/08/2023 10:40 AM   Modules accepted: Orders

## 2023-06-11 ENCOUNTER — Other Ambulatory Visit: Payer: Self-pay | Admitting: Family Medicine

## 2023-06-11 DIAGNOSIS — R569 Unspecified convulsions: Secondary | ICD-10-CM

## 2023-07-02 ENCOUNTER — Other Ambulatory Visit: Payer: Self-pay | Admitting: Family Medicine

## 2023-07-02 DIAGNOSIS — Z1231 Encounter for screening mammogram for malignant neoplasm of breast: Secondary | ICD-10-CM

## 2023-07-05 ENCOUNTER — Ambulatory Visit: Payer: Medicare Other | Admitting: Family Medicine

## 2023-07-05 ENCOUNTER — Encounter: Payer: Self-pay | Admitting: Family Medicine

## 2023-07-05 ENCOUNTER — Ambulatory Visit: Payer: Medicare Other

## 2023-07-05 VITALS — BP 114/73 | HR 79 | Temp 97.5°F | Resp 16 | Ht 62.0 in | Wt 132.0 lb

## 2023-07-05 DIAGNOSIS — Z9181 History of falling: Secondary | ICD-10-CM

## 2023-07-05 DIAGNOSIS — J441 Chronic obstructive pulmonary disease with (acute) exacerbation: Secondary | ICD-10-CM | POA: Diagnosis not present

## 2023-07-05 DIAGNOSIS — M8580 Other specified disorders of bone density and structure, unspecified site: Secondary | ICD-10-CM

## 2023-07-05 DIAGNOSIS — R4181 Age-related cognitive decline: Secondary | ICD-10-CM

## 2023-07-05 MED ORDER — ROMOSOZUMAB-AQQG 105 MG/1.17ML ~~LOC~~ SOSY
210.0000 mg | PREFILLED_SYRINGE | SUBCUTANEOUS | Status: AC
  Administered 2023-08-08: 210 mg via SUBCUTANEOUS

## 2023-07-05 NOTE — Patient Instructions (Signed)
 It was very nice to see you today!  Referral sent to home health   PLEASE NOTE:  If you had any lab tests please let us  know if you have not heard back within a few days. You may see your results on MyChart before we have a chance to review them but we will give you a call once they are reviewed by us . If we ordered any referrals today, please let us  know if you have not heard from their office within the next week.   Please try these tips to maintain a healthy lifestyle:  Eat most of your calories during the day when you are active. Eliminate processed foods including packaged sweets (pies, cakes, cookies), reduce intake of potatoes, white bread, white pasta, and white rice. Look for whole grain options, oat flour or almond flour.  Each meal should contain half fruits/vegetables, one quarter protein, and one quarter carbs (no bigger than a computer mouse).  Cut down on sweet beverages. This includes juice, soda, and sweet tea. Also watch fruit intake, though this is a healthier sweet option, it still contains natural sugar! Limit to 3 servings daily.  Drink at least 1 glass of water with each meal and aim for at least 8 glasses per day  Exercise at least 150 minutes every week.

## 2023-07-05 NOTE — Progress Notes (Signed)
 Subjective:     Patient ID: [name], female    DOB: Aug 05, 1932, 89 y.o.   MRN: [id number]  Chief Complaint  Patient presents with   Ear Pain   Headache   Cough    Still on antibiotics    Watery Eyes   Fatigue   Nasal Congestion   Depression    Having some depression     HPI Discussed the use of AI scribe software for clinical note transcription with the patient, who gave verbal consent to proceed.  History of Present Illness   [name] is a 89 year old female with COPD who presents with persistent cough and respiratory issues. She is accompanied by her husband, who is her primary caregiver.  For the past six to eight weeks, she has experienced a persistent cough and respiratory issues. Initially, her pulmonary doctor(Dr Beuford) prescribed prednisone   and an antibiotic. The prednisone  dose was increased from 15 mg to 25 mg, and later to 40 mg due to inadequate response, before being reduced to 20 mg after some improvement. Despite these adjustments, she continues to experience nighttime coughing and congestion. About fifteen days ago, doxycycline  was prescribed but discontinued due to lack of improvement. Her ENT doctor, familiar with her empty nose syndrome, prescribed Cipro 500 mg, which she started four days ago, leading to some improvement. She continues to use cough medicine at night.  She has a history of empty nose syndrome, characterized by the absence of turbinates, leading to nasal congestion when lying down. She has undergone four nasal surgeries, contributing to her current condition. Nasal congestion occurs primarily at night and is relieved when sitting up. She also experiences nausea, runny eyes, and headaches during this period. She was noted to be dehydrated during a previous visit, and efforts have been made to increase her fluid intake by diluting her preferred sweet drink with water.  There was a recent incident where she choked on food,  raising concerns about a swallowed dental bridge. An x-ray and subsequent scan revealed a bowel obstruction, which was resolved with MiraLAX  and other mild laxatives. No foreign body was found in the bowels after the obstruction was cleared-all the studies thru [facility].  There are concerns about her short-term memory, as she often repeats questions and forgets recent conversations. Cognitive testing showed some difficulty with short-term memory tasks, attributed to her age. Her mother lived to be 30 years old and had similar memory issues in her later years.  Her husband reports that she has fallen a few times, raising concerns about her safety at home. She has difficulty with bathing due to mobility issues and requires assistance. She also experiences cold sensitivity during bathing, complicating her care routine.  She has experienced a bout of depression, which has improved with the resolution of her respiratory symptoms. She has been more active and social, participating in activities like chair yoga and socializing with her book club, positively impacting her mood.       There are no preventive care reminders to display for this patient.  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 (HCC) 03/2011   chronic sinusitis 08/08/2012   Colon polyp    Constipation  COPD (chronic obstructive pulmonary disease) (HCC)    Cystocele 09/16/2012   GERD (gastroesophageal reflux disease)    Hyperlipidemia    Hypertension    Hypothyroid    Neurogenic bladder 2013   husband caths pt, manages with timed void   Osteoporosis    Pulmonary embolism (HCC)    1980s   Raynaud phenomenon    Renal cyst    Non-complex, contrast MRI 2013 with L>R non-complex cysts, dominant 3.7  left lower pole   Seizures (HCC)    1990 last seizures on meds Phenobarb   Shingles late 90's   Skin cancer    squamous cell   Thyroid  nodule    Right thyroid  lobe, only seen on Sagittal imaging measures 2.3 cm in craniocaudal dimension and appears stable   Tubular adenoma    Vitamin D  deficiency     Past Surgical History:  Procedure Laterality Date   ABDOMINAL HYSTERECTOMY     ANTERIOR FUSION CLIVUS-C2 EXTRAORAL W/ ODONTOID EXCISION  12/2012   APPENDECTOMY     ARTHROSCOPIC REPAIR ACL Left 1976   BASAL CELL CARCINOMA EXCISION     face   BLADDER SUSPENSION     BREAST DUCTAL SYSTEM EXCISION Right 01/15/2014   Procedure: EXCISION DUCTAL SYSTEM RIGHT BREAST;  Surgeon: Jina Nephew, MD;  Location: [facility];  Service: General;  Laterality: Right;   BREAST EXCISIONAL BIOPSY Right    CARPAL TUNNEL RELEASE Bilateral    B, mult times   CATARACT EXTRACTION     bilateral   cervical neck ablation     x 7, C3-C6/3 screws and plate   CESAREAN SECTION     CYSTOCELE REPAIR     DILATION AND CURETTAGE OF UTERUS     duptyren's contracture right hand     FINGER ARTHRODESIS Right 05/06/2015   Procedure: RIGHT RING PROXIMAL INTERPHALANGEAL FUSION (PIP);  Surgeon: Franky Curia, MD;  Location: [facility];  Service: Orthopedics;  Laterality: Right;   FINGER GANGLION CYST EXCISION     right   HEMICOLECTOMY Right    JOINT REPLACEMENT     right and left basal joints of thumbs   left finger fusion     3 fingers on left hand/one finger right   left ovary and tube removed     NASAL POLYP SURGERY     4 sinus surgeries   NASAL SEPTUM SURGERY     PANNICULECTOMY     PROXIMAL INTERPHALANGEAL FUSION (PIP) Left 09/09/2013   Procedure: FUSION LEFT INDEX PROXIMAL INTERPHALANGEAL JOINT (PIP);  Surgeon: Lamar LULLA Leonor Mickey., MD;  Location: [facility];  Service: Orthopedics;  Laterality: Left;   right median nerve decompression     SHOULDER ARTHROSCOPY     x2  left, 1 right   sinus surgeries     x 4   squamous lesions removed     neck and face   STERIOD INJECTION Right 05/06/2015   Procedure: STEROID INJECTION;  Surgeon: Franky Curia, MD;  Location: [facility];  Service: Orthopedics;  Laterality: Right;  Right Index Finger Proximal InterPhalangeal Joint Injection   TONSILLECTOMY AND ADENOIDECTOMY     TUBAL LIGATION     vocal polyps removed       Current Outpatient Medications:    albuterol  (PROVENTIL ) (2.5 MG/3ML) 0.083% nebulizer solution, , Disp: , Rfl:    amLODipine  (NORVASC ) 10 MG tablet, TAKE 1 TABLET(10 MG) BY MOUTH DAILY, Disp: 90 tablet, Rfl: 3   arformoterol  (BROVANA ) 15  MCG/2ML NEBU, USE 1 VIAL IN NEBULIZER TWICE DAILY, Disp: , Rfl:    atorvastatin  (LIPITOR) 40 MG tablet, TAKE 1 TABLET BY MOUTH EVERY NIGHT AT BEDTIME, Disp: 90 tablet, Rfl: 3   B-D TB SYRINGE 1CC/27GX1/2 27G X 1/2 1 ML MISC, USE 2 APPLICATIONS ROUTE ONCE A WEEK., Disp: , Rfl:    budesonide  (PULMICORT ) 0.25 MG/2ML nebulizer solution, Take by nebulization 2 (two) times daily., Disp: , Rfl:    CALCIUM  PO, Take 600 mg by mouth daily., Disp: , Rfl:    carbamazepine  (TEGRETOL  XR) 100 MG 12 hr tablet, Take by mouth. 2 tablets daily, Disp: , Rfl:    Cholecalciferol 25 MCG (1000 UT) capsule, Take 1,000 Units by mouth daily., Disp: , Rfl:    Cyanocobalamin  (B-12 PO), Take by mouth daily., Disp: , Rfl:    Docusate Sodium  (COLACE PO), Take 1 tablet by mouth daily., Disp: , Rfl:    gabapentin  (NEURONTIN ) 300 MG capsule, Take 300 mg by mouth at bedtime., Disp: , Rfl:    levothyroxine  (SYNTHROID ) 50 MCG tablet, TAKE 1 TABLET(50 MCG) BY MOUTH DAILY BEFORE BREAKFAST (Patient taking differently: Take 25 mcg by mouth daily. TAKE 1 TABLET(50 MCG) BY MOUTH DAILY BEFORE BREAKFAST), Disp: 90 tablet, Rfl: 3   mometasone  (NASONEX ) 50 MCG/ACT nasal spray, Place 2 sprays into the nose daily., Disp: , Rfl:    montelukast  (SINGULAIR ) 10 MG tablet, TAKE 1 TABLET(10 MG) BY MOUTH AT  BEDTIME, Disp: 90 tablet, Rfl: 3   PHENobarbital  (LUMINAL) 32.4 MG tablet, TAKE 1 TABLET BY MOUTH AT 6 AM AND 2 TABLETS BY MOUTH AT 8 PM., Disp: 270 tablet, Rfl: 1   predniSONE  (DELTASONE ) 5 MG tablet, Take 15 mg by mouth daily., Disp: , Rfl:    VENTOLIN  HFA 108 (90 Base) MCG/ACT inhaler, SMARTSIG:1 Puff(s) Via Inhaler Every 4 Hours PRN, Disp: , Rfl:    ciprofloxacin (CIPRO) 500 MG tablet, Take by mouth. (Patient not taking: Reported on 07/05/2023), Disp: , Rfl:   Current Facility-Administered Medications:    [START ON 07/09/2023] Romosozumab -aqqg (EVENITY ) 105 MG/1. injection 210 mg, 210 mg, Subcutaneous, Once, Wendolyn Jenkins Jansky, MD   NOREEN ON 08/04/2023] Romosozumab -aqqg (EVENITY ) 105 MG/1. injection 210 mg, 210 mg, Subcutaneous, Q30 days, Wendolyn Jenkins Jansky, MD  Allergies  Allergen Reactions   Iodinated Contrast Media Anaphylaxis    Cardiac arrest   Iodine Other (See Comments)    Cardiac arrest As young adult, received iodinated contrast for IVP, went into cardiac arrest, was resuscitated and hospitalized. Has not received iodinated contrast since that episode. Reports no adverse reaction to betadine.    Metrizamide Other (See Comments)    Cardiac arrest     Morphine Other (See Comments)    Cardiac arrest Patient has previously tolerated hydrocodone, hydromorphone  and fentanyl     Morphine And Codeine Other (See Comments)    Cardiac arrest   Cocklebur    [place]    Mugwort    Sheep Sorrel    Spiny Pigweed (Amaranthus Spinosus) Skin Test    ROS neg/noncontributory except as noted HPI/below      Objective:     BP 114/73   Pulse 79   Temp (!) 97.5 F (36.4 C) (Temporal)   Resp 16   Ht 5' 2 (1.575 m)   Wt 132 lb (59.9 kg)   SpO2 98%   BMI 24.14 kg/m  Wt Readings from Last 3 Encounters:  07/05/23 132 lb (59.9 kg)  06/07/23 136 lb (61.7 kg)  04/05/23 131 lb  1.3 oz (59.5 kg)    Physical Exam   Gen: WDWN NAD HEENT: NCAT, conjunctiva not injected, sclera  nonicteric NECK:  supple, no thyromegaly, no nodes, no carotid bruits CARDIAC: RRR, S1S2+, no murmur. DP 2+B LUNGS: CTAB. No wheezes ABDOMEN:  BS+, soft, NTND, No HSM, no masses EXT:  no edema MSK: rollator NEURO: A&O x3.  CN II-XII intact.  Mmse done-only 1 mistake on serial 7's.  PSYCH: normal mood. Good eye contact     Assessment & Plan:  Osteopenia, unspecified location -     Romosozumab -aqqg  At high risk for falls  Chronic obstructive pulmonary disease with acute exacerbation (HCC)  Age-related cognitive decline  Assessment and Plan    Chronic Obstructive Pulmonary Disease (COPD) COPD recently exacerbated, initially treated with prednisone  and antibiotics. Despite initial improvement, nocturnal coughing and nasal congestion persisted. Doxycycline  was ineffective; ciprofloxacin has shown improvement. Currently on 20 mg prednisone . Discussed risks of long-term antibiotic use and prednisone  side effects, including weight gain and immune suppression. Prefers to avoid hospitalization. Continue ciprofloxacin as prescribed. .  Empty Nose Syndrome Empty nose syndrome with absence of nasal turbinates causes nasal congestion and nocturnal coughing. Underwent four nasal surgeries. Discussed potential benefits and risks of further surgical interventions and current management strategies. Continue current management and follow-up with ENT as needed.  Depression Recent episode of depression improved with resolution of pulmonary symptoms. Not currently taking Lexapro   Cognitive Decline Signs of short-term memory loss and repetitive questioning likely due to normal aging. Performed well on a brief cognitive assessment. Discussed importance of mental exercises and social engagement to maintain cognitive function. Encourage mental exercises such as reading, word search, and social activities. Use a calendar and notebooks for reminders.  Fall Risk History of falls and balance difficulties  raises safety concerns at home. Discussed potential benefits of home health services and private aides for assistance and companionship. Submit a consult for home health services to assist with bathing and provide companionship. Consider hiring a private aide if home health services are insufficient.  General Health Maintenance On vitamin C and vitamin D  supplements. Lost weight and currently at a healthy weight. Concern about diet and nutritional intake. Discussed benefits of a balanced diet and multivitamin supplementation. Start a multivitamin with B complex. Encourage a balanced diet with adequate caloric intake.   osteoporosis-got monthly Evenity  injection  Follow-up Schedule follow-up appointments as needed.       No follow-ups on file.  Jenkins CHRISTELLA Carrel, MD

## 2023-07-09 DIAGNOSIS — G6181 Chronic inflammatory demyelinating polyneuritis: Secondary | ICD-10-CM | POA: Diagnosis not present

## 2023-07-09 DIAGNOSIS — J449 Chronic obstructive pulmonary disease, unspecified: Secondary | ICD-10-CM | POA: Diagnosis not present

## 2023-07-09 DIAGNOSIS — J45909 Unspecified asthma, uncomplicated: Secondary | ICD-10-CM | POA: Diagnosis not present

## 2023-07-10 DIAGNOSIS — M810 Age-related osteoporosis without current pathological fracture: Secondary | ICD-10-CM | POA: Diagnosis not present

## 2023-07-10 DIAGNOSIS — I10 Essential (primary) hypertension: Secondary | ICD-10-CM | POA: Diagnosis not present

## 2023-07-10 DIAGNOSIS — Z9181 History of falling: Secondary | ICD-10-CM | POA: Diagnosis not present

## 2023-07-10 DIAGNOSIS — J441 Chronic obstructive pulmonary disease with (acute) exacerbation: Secondary | ICD-10-CM | POA: Diagnosis not present

## 2023-07-10 DIAGNOSIS — Z7952 Long term (current) use of systemic steroids: Secondary | ICD-10-CM | POA: Diagnosis not present

## 2023-07-10 DIAGNOSIS — D692 Other nonthrombocytopenic purpura: Secondary | ICD-10-CM | POA: Diagnosis not present

## 2023-07-10 DIAGNOSIS — Z7951 Long term (current) use of inhaled steroids: Secondary | ICD-10-CM | POA: Diagnosis not present

## 2023-07-10 DIAGNOSIS — Z86711 Personal history of pulmonary embolism: Secondary | ICD-10-CM | POA: Diagnosis not present

## 2023-07-10 DIAGNOSIS — E039 Hypothyroidism, unspecified: Secondary | ICD-10-CM | POA: Diagnosis not present

## 2023-07-10 DIAGNOSIS — K5909 Other constipation: Secondary | ICD-10-CM | POA: Diagnosis not present

## 2023-07-10 DIAGNOSIS — D649 Anemia, unspecified: Secondary | ICD-10-CM | POA: Diagnosis not present

## 2023-07-10 DIAGNOSIS — Z85828 Personal history of other malignant neoplasm of skin: Secondary | ICD-10-CM | POA: Diagnosis not present

## 2023-07-10 DIAGNOSIS — F32A Depression, unspecified: Secondary | ICD-10-CM | POA: Diagnosis not present

## 2023-07-11 NOTE — Telephone Encounter (Signed)
Copied from CRM [id number]. Topic: Clinical - Home Health Verbal Orders >> Jul 11, 2023  3:44 PM Larwance Sachs wrote: Caller/Agency: [facility] Callback Number: [phone] Service Requested: Physical Therapy Any new concerns about the patient? Patient refused physical therapy and home health is notifying provider

## 2023-07-17 DIAGNOSIS — F32A Depression, unspecified: Secondary | ICD-10-CM | POA: Diagnosis not present

## 2023-07-17 DIAGNOSIS — I10 Essential (primary) hypertension: Secondary | ICD-10-CM | POA: Diagnosis not present

## 2023-07-17 DIAGNOSIS — M810 Age-related osteoporosis without current pathological fracture: Secondary | ICD-10-CM | POA: Diagnosis not present

## 2023-07-17 DIAGNOSIS — J441 Chronic obstructive pulmonary disease with (acute) exacerbation: Secondary | ICD-10-CM | POA: Diagnosis not present

## 2023-07-17 DIAGNOSIS — D649 Anemia, unspecified: Secondary | ICD-10-CM | POA: Diagnosis not present

## 2023-07-17 DIAGNOSIS — E039 Hypothyroidism, unspecified: Secondary | ICD-10-CM | POA: Diagnosis not present

## 2023-07-20 DIAGNOSIS — M810 Age-related osteoporosis without current pathological fracture: Secondary | ICD-10-CM | POA: Diagnosis not present

## 2023-07-20 DIAGNOSIS — J441 Chronic obstructive pulmonary disease with (acute) exacerbation: Secondary | ICD-10-CM | POA: Diagnosis not present

## 2023-07-20 DIAGNOSIS — E039 Hypothyroidism, unspecified: Secondary | ICD-10-CM | POA: Diagnosis not present

## 2023-07-20 DIAGNOSIS — I10 Essential (primary) hypertension: Secondary | ICD-10-CM | POA: Diagnosis not present

## 2023-07-20 DIAGNOSIS — F32A Depression, unspecified: Secondary | ICD-10-CM | POA: Diagnosis not present

## 2023-07-20 DIAGNOSIS — D649 Anemia, unspecified: Secondary | ICD-10-CM | POA: Diagnosis not present

## 2023-07-23 DIAGNOSIS — M810 Age-related osteoporosis without current pathological fracture: Secondary | ICD-10-CM | POA: Diagnosis not present

## 2023-07-23 DIAGNOSIS — I10 Essential (primary) hypertension: Secondary | ICD-10-CM | POA: Diagnosis not present

## 2023-07-23 DIAGNOSIS — E039 Hypothyroidism, unspecified: Secondary | ICD-10-CM | POA: Diagnosis not present

## 2023-07-23 DIAGNOSIS — F32A Depression, unspecified: Secondary | ICD-10-CM | POA: Diagnosis not present

## 2023-07-23 DIAGNOSIS — J441 Chronic obstructive pulmonary disease with (acute) exacerbation: Secondary | ICD-10-CM | POA: Diagnosis not present

## 2023-07-23 DIAGNOSIS — D649 Anemia, unspecified: Secondary | ICD-10-CM | POA: Diagnosis not present

## 2023-07-25 ENCOUNTER — Ambulatory Visit: Payer: Medicare Other | Admitting: Family Medicine

## 2023-07-27 ENCOUNTER — Ambulatory Visit: Payer: Medicare Other | Admitting: Family Medicine

## 2023-07-30 DIAGNOSIS — J441 Chronic obstructive pulmonary disease with (acute) exacerbation: Secondary | ICD-10-CM | POA: Diagnosis not present

## 2023-07-30 DIAGNOSIS — D649 Anemia, unspecified: Secondary | ICD-10-CM | POA: Diagnosis not present

## 2023-07-30 DIAGNOSIS — M810 Age-related osteoporosis without current pathological fracture: Secondary | ICD-10-CM | POA: Diagnosis not present

## 2023-07-30 DIAGNOSIS — I10 Essential (primary) hypertension: Secondary | ICD-10-CM | POA: Diagnosis not present

## 2023-07-30 DIAGNOSIS — F32A Depression, unspecified: Secondary | ICD-10-CM | POA: Diagnosis not present

## 2023-07-30 DIAGNOSIS — E039 Hypothyroidism, unspecified: Secondary | ICD-10-CM | POA: Diagnosis not present

## 2023-07-31 DIAGNOSIS — F32A Depression, unspecified: Secondary | ICD-10-CM | POA: Diagnosis not present

## 2023-07-31 DIAGNOSIS — I10 Essential (primary) hypertension: Secondary | ICD-10-CM | POA: Diagnosis not present

## 2023-07-31 DIAGNOSIS — E039 Hypothyroidism, unspecified: Secondary | ICD-10-CM | POA: Diagnosis not present

## 2023-07-31 DIAGNOSIS — M810 Age-related osteoporosis without current pathological fracture: Secondary | ICD-10-CM | POA: Diagnosis not present

## 2023-07-31 DIAGNOSIS — J441 Chronic obstructive pulmonary disease with (acute) exacerbation: Secondary | ICD-10-CM | POA: Diagnosis not present

## 2023-07-31 DIAGNOSIS — D649 Anemia, unspecified: Secondary | ICD-10-CM | POA: Diagnosis not present

## 2023-08-06 DIAGNOSIS — E039 Hypothyroidism, unspecified: Secondary | ICD-10-CM | POA: Diagnosis not present

## 2023-08-06 DIAGNOSIS — M810 Age-related osteoporosis without current pathological fracture: Secondary | ICD-10-CM | POA: Diagnosis not present

## 2023-08-06 DIAGNOSIS — D649 Anemia, unspecified: Secondary | ICD-10-CM | POA: Diagnosis not present

## 2023-08-06 DIAGNOSIS — J441 Chronic obstructive pulmonary disease with (acute) exacerbation: Secondary | ICD-10-CM | POA: Diagnosis not present

## 2023-08-06 DIAGNOSIS — I10 Essential (primary) hypertension: Secondary | ICD-10-CM | POA: Diagnosis not present

## 2023-08-06 DIAGNOSIS — F32A Depression, unspecified: Secondary | ICD-10-CM | POA: Diagnosis not present

## 2023-08-08 ENCOUNTER — Ambulatory Visit: Payer: Medicare Other | Admitting: Family Medicine

## 2023-08-08 ENCOUNTER — Encounter: Payer: Self-pay | Admitting: Family Medicine

## 2023-08-08 VITALS — BP 122/74 | HR 87 | Temp 97.7°F | Resp 16 | Ht 62.0 in | Wt 135.4 lb

## 2023-08-08 DIAGNOSIS — R1084 Generalized abdominal pain: Secondary | ICD-10-CM

## 2023-08-08 DIAGNOSIS — R21 Rash and other nonspecific skin eruption: Secondary | ICD-10-CM | POA: Diagnosis not present

## 2023-08-08 DIAGNOSIS — I10 Essential (primary) hypertension: Secondary | ICD-10-CM

## 2023-08-08 DIAGNOSIS — M810 Age-related osteoporosis without current pathological fracture: Secondary | ICD-10-CM | POA: Diagnosis not present

## 2023-08-08 DIAGNOSIS — J432 Centrilobular emphysema: Secondary | ICD-10-CM

## 2023-08-08 DIAGNOSIS — E039 Hypothyroidism, unspecified: Secondary | ICD-10-CM

## 2023-08-08 DIAGNOSIS — R569 Unspecified convulsions: Secondary | ICD-10-CM

## 2023-08-08 DIAGNOSIS — E7800 Pure hypercholesterolemia, unspecified: Secondary | ICD-10-CM | POA: Diagnosis not present

## 2023-08-08 MED ORDER — ROMOSOZUMAB-AQQG 105 MG/1.17ML ~~LOC~~ SOSY
210.0000 mg | PREFILLED_SYRINGE | SUBCUTANEOUS | Status: AC
  Administered 2023-09-11: 210 mg via SUBCUTANEOUS

## 2023-08-08 NOTE — Patient Instructions (Signed)

## 2023-08-08 NOTE — Progress Notes (Signed)
 Subjective:     Patient ID: [name], female    DOB: 1932/06/27, 89 y.o.   MRN: [id number]  Chief Complaint  Patient presents with   Medical Management of Chronic Issues    6 month follow-up on htn    Patient is accompanied by her Husband,  HPI HTN - Pt is on Norvasc 10 mg.  Bp  110's/60' No ha/dizziness/cp/palp/edema. Denies seizures.   COPD- States that she has to sleep with the head of the bed elevated or she experiences discomfort. Her feelings of congestion worsen at bedtime. Compliant with Nasonex 50 mcg spray, Brovana 15 mcg, Prednisone 15 mg, and Albuterol 0.083% solution as needed. Her husband states she requires the Albuterol nearly every night out of the week.    Falls -chronic neuropathy.  Using rollator. Husband states that she has fallen multiple times in the past year. If she attempts to go to the bathroom on her own at night, she loses her balance and cannot feel her feet, causing her to trip. Unable to stand to cook.husband can only leave alone for about 1 hr at a time.  Sz disorder.  Seeing neuro at [facility].  Very stable on tegretol and phenobarbitol.  In past, when changed to other agents either SE or breakthru sz.  Hypothyroidism-on synthroid 0.5mg  daily.  No tremors, palp HLD-on atorvastatin 40mg  daily-doing well.   Discussed the use of AI scribe software for clinical note transcription with the patient, who gave verbal consent to proceed.  History of Present Illness   The patient is an 89 year old with osteoporosis and COPD who presents with fatigue and a rash. She is accompanied by her husband.  She experiences significant fatigue, describing it as overwhelming and leading to daytime sleepiness and neck pain from falling asleep on the couch. Despite going to bed early due to waking up at 4:30 AM, she remains tired throughout the day. She attributes some of her fatigue to her chronic COPD and is concerned about the impact of her medications on her energy  levels.  She also reports elevated hgb and is scheduled to see Dr. Colin Benton for further evaluation of her anemia and ferritin levels.  The rash began approximately five weeks ago as small red spots on her right leg, which have since spread to her arms and other leg. The rash is neither itchy nor painful but has persisted. A dermatologist prescribed a salve that worsened the rash. She suspects a link between the rash and her medications, particularly Evenity, which she started around the same time the rash appeared. also, was on cipro for copd exacerbation.   She has chronic COPD and is currently reducing her prednisone dose to 10 mg, as she had difficulty at 15 mg.  She is on Evenity for osteoporosis, receiving monthly injections.  She is also on amlodipine 10 mg for hypertension, gabapentin for leg cramps, and levothyroxine for hypothyroidism, though the exact dose is uncertain. She has a history of seizures managed with Tegretol and phenobarb, with no recent seizures reported.  She reports abdominal bloating and difficulty with constipation, managed with a stool softener. She has a history of colon surgery for polyps and a hysterectomy. She notes her stomach has been getting larger, which is unusual for her.  Her blood pressure is well-controlled on amlodipine 10 mg, with no headaches, chest pain, or shortness of breath. She participates in chair yoga at a senior center and has a history of being active.  There are no preventive care reminders to display for this patient.   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 (HCC) 03/2011   chronic sinusitis 08/08/2012   Colon polyp    Constipation    COPD (chronic obstructive pulmonary disease) (HCC)    Cystocele 09/16/2012    GERD (gastroesophageal reflux disease)    Hyperlipidemia    Hypertension    Hypothyroid    Neurogenic bladder 2013   husband caths pt, manages with timed void   Osteoporosis    Pulmonary embolism (HCC)    1980s   Raynaud phenomenon    Renal cyst    Non-complex, contrast MRI 2013 with L>R non-complex cysts, dominant 3.7 left lower pole   Seizures (HCC)    1990 last seizures on meds Phenobarb   Shingles late 90's   Skin cancer    squamous cell   Thyroid nodule    Right thyroid lobe, only seen on Sagittal imaging measures 2.3 cm in craniocaudal dimension and appears stable   Tubular adenoma    Vitamin D deficiency     Past Surgical History:  Procedure Laterality Date   ABDOMINAL HYSTERECTOMY     ANTERIOR FUSION CLIVUS-C2 EXTRAORAL W/ ODONTOID EXCISION  12/2012   APPENDECTOMY     ARTHROSCOPIC REPAIR ACL Left 1976   BASAL CELL CARCINOMA EXCISION     face   BLADDER SUSPENSION     BREAST DUCTAL SYSTEM EXCISION Right 01/15/2014   Procedure: EXCISION DUCTAL SYSTEM RIGHT BREAST;  Surgeon: Almond Lint, MD;  Location: [facility];  Service: General;  Laterality: Right;   BREAST EXCISIONAL BIOPSY Right    CARPAL TUNNEL RELEASE Bilateral    B, mult times   CATARACT EXTRACTION     bilateral   cervical neck ablation     x 7, C3-C6/3 screws and plate   CESAREAN SECTION     CYSTOCELE REPAIR     DILATION AND CURETTAGE OF UTERUS     duptyren's contracture right hand     FINGER ARTHRODESIS Right 05/06/2015   Procedure: RIGHT RING PROXIMAL INTERPHALANGEAL FUSION (PIP);  Surgeon: Betha Loa, MD;  Location: [facility];  Service: Orthopedics;  Laterality: Right;   FINGER GANGLION CYST EXCISION     right   HEMICOLECTOMY Right    JOINT REPLACEMENT     right and left basal joints of thumbs   left finger fusion     3 fingers on left hand/one finger right   left ovary and tube removed     NASAL POLYP SURGERY     4 sinus surgeries   NASAL SEPTUM SURGERY      PANNICULECTOMY     PROXIMAL INTERPHALANGEAL FUSION (PIP) Left 09/09/2013   Procedure: FUSION LEFT INDEX PROXIMAL INTERPHALANGEAL JOINT (PIP);  Surgeon: Wyn Forster., MD;  Location: [facility];  Service: Orthopedics;  Laterality: Left;   right median nerve decompression     SHOULDER ARTHROSCOPY     x2 left, 1 right   sinus surgeries     x 4   squamous lesions removed     neck and face   STERIOD INJECTION Right 05/06/2015   Procedure: STEROID INJECTION;  Surgeon: Betha Loa, MD;  Location: [facility];  Service: Orthopedics;  Laterality: Right;  Right Index Finger Proximal InterPhalangeal Joint Injection   TONSILLECTOMY AND ADENOIDECTOMY     TUBAL LIGATION     vocal polyps removed       Current Outpatient Medications:    albuterol (PROVENTIL) (2.5 MG/3ML) 0.083% nebulizer solution, , Disp: , Rfl:    amLODipine (NORVASC) 10 MG tablet, TAKE 1 TABLET(10 MG) BY MOUTH DAILY, Disp: 90 tablet, Rfl: 3   ammonium lactate (LAC-HYDRIN) 12 % lotion, Apply topically daily., Disp: , Rfl:    arformoterol (BROVANA) 15 MCG/2ML NEBU, USE 1 VIAL IN NEBULIZER TWICE DAILY, Disp: , Rfl:    atorvastatin (LIPITOR) 40 MG tablet, TAKE 1 TABLET BY MOUTH EVERY NIGHT AT BEDTIME, Disp: 90 tablet, Rfl: 3   B-D TB SYRINGE 1CC/27GX1/2" 27G X 1/2" 1 ML MISC, USE 2 APPLICATIONS ROUTE ONCE A WEEK., Disp: , Rfl:    budesonide (PULMICORT) 0.25 MG/2ML nebulizer solution, Take by nebulization 2 (two) times daily., Disp: , Rfl:    CALCIUM PO, Take 600 mg by mouth daily., Disp: , Rfl:    carbamazepine (TEGRETOL XR) 100 MG 12 hr tablet, Take by mouth. 2 tablets daily, Disp: , Rfl:    Cholecalciferol 25 MCG (1000 UT) capsule, Take 1,000 Units by mouth daily., Disp: , Rfl:    Cyanocobalamin (B-12 PO), Take by mouth daily., Disp: , Rfl:    Docusate Sodium (COLACE PO), Take 1 tablet by mouth daily., Disp: , Rfl:    gabapentin (NEURONTIN) 300 MG capsule, Take 300 mg by mouth at bedtime., Disp: ,  Rfl:    levothyroxine (SYNTHROID) 50 MCG tablet, TAKE 1 TABLET(50 MCG) BY MOUTH DAILY BEFORE BREAKFAST (Patient taking differently: Take 25 mcg by mouth daily. TAKE 1 TABLET(50 MCG) BY MOUTH DAILY BEFORE BREAKFAST), Disp: 90 tablet, Rfl: 3   mometasone (NASONEX) 50 MCG/ACT nasal spray, Place 2 sprays into the nose daily., Disp: , Rfl:    montelukast (SINGULAIR) 10 MG tablet, TAKE 1 TABLET(10 MG) BY MOUTH AT BEDTIME, Disp: 90 tablet, Rfl: 3   PHENobarbital (LUMINAL) 32.4 MG tablet, TAKE 1 TABLET BY MOUTH AT 6 AM AND 2 TABLETS BY MOUTH AT 8 PM., Disp: 270 tablet, Rfl: 1   predniSONE (DELTASONE) 5 MG tablet, Take 10 mg by mouth daily. Patient is on 10 mg daily, Disp: , Rfl:    triamcinolone cream (KENALOG) 0.1 %, Apply topically 2 (two) times daily., Disp: , Rfl:    VENTOLIN HFA 108 (90 Base) MCG/ACT inhaler, SMARTSIG:1 Puff(s) Via Inhaler Every 4 Hours PRN, Disp: , Rfl:   Current Facility-Administered Medications:    Romosozumab-aqqg (EVENITY) 105 MG/1. injection 210 mg, 210 mg, Subcutaneous, Once, Jeani Sow, MD   [START ON 09/07/2023] Romosozumab-aqqg (EVENITY) 105 MG/1. injection 210 mg, 210 mg, Subcutaneous, Q30 days, Jeani Sow, MD  Allergies  Allergen Reactions   Iodinated Contrast Media Anaphylaxis    Cardiac arrest   Iodine Other (See Comments)    Cardiac arrest As young adult, received iodinated contrast for IVP, went into cardiac arrest, was resuscitated and hospitalized. Has not received iodinated contrast since that episode. Reports no adverse reaction to betadine.    Metrizamide Other (See Comments)    Cardiac arrest     Morphine Other (See Comments)    Cardiac arrest Patient has previously tolerated hydrocodone, hydromorphone and fentanyl    Morphine And Codeine Other (See Comments)    Cardiac arrest   Cocklebur    Lambs Pam[place] Hurry  Mugwort    Sheep Sorrel    Spiny Pigweed (Amaranthus Spinosus) Skin Test    ROS neg/noncontributory except as noted  HPI/below      Objective:     BP 122/74   Pulse 87   Temp 97.7 F (36.5 C) (Temporal)   Resp 16   Ht 5\' 2"  (1.575 m)   Wt 135 lb 6 oz (61.4 kg)   SpO2 96%   BMI 24.76 kg/m  Wt Readings from Last 3 Encounters:  08/08/23 135 lb 6 oz (61.4 kg)  07/05/23 132 lb (59.9 kg)  06/07/23 136 lb (61.7 kg)    Physical Exam   Gen: WDWN NAD HEENT: NCAT, conjunctiva not injected, sclera nonicteric NECK:  supple, no thyromegaly, no nodes, no carotid bruits CARDIAC: RRR, S1S2+, no murmur. DP 2+B LUNGS: CTAB. No wheezes ABDOMEN:  BS+, soft,mildly tender diffusely and bloated w/some tenseness, No HSM, no masses EXT:  no edema MSK: rollator.  Husband assists to stand NEURO: A&O x3.  CN II-XII intact.  PSYCH: normal mood. Good eye contact Scattered, scaley, pink, mp rash on legs and arms.  Nothing on trunk  Reviewed Labs.      Assessment & Plan:  Primary hypertension  Pure hypercholesterolemia  Acquired hypothyroidism  Seizures (HCC)  Centrilobular emphysema (HCC)  Age-related osteoporosis without current pathological fracture -     Romosozumab-aqqg  Generalized abdominal pain -     CT ABDOMEN PELVIS WO CONTRAST; Future  Rash  Assessment and Plan    Chronic Obstructive Pulmonary Disease (COPD)   Chronic COPD presents ongoing management challenges. Current treatment includes prednisone, with plans to reduce the dose from 15 mg to 10 mg per Pulmonary. Significant fatigue may be related to COPD or other factors. The decision to reduce prednisone is to manage side effects while monitoring for symptom exacerbation. Reduce prednisone to 10 mg and monitor for symptom exacerbation. Contact the pulmonologist if symptoms worsen after prednisone reduction.  Rash   A rash has been present for approximately five weeks, primarily on the legs and arms, without itching or pain. Possible causes include Evenity injections or recent antibiotic use. A dermatologist has been consulted, and a  biopsy may be considered to determine if it is a drug rash. follow up w/dermatologist to evaluate the rash and consider a biopsy. Discuss with the dermatologist the potential link between the rash and Evenity. Monitor the rash for changes or spread.  Osteoporosis   Osteoporosis is being treated with Evenity injections, with three doses received. There is concern that Evenity may be causing a rash, although this is not confirmed. The risk of skin rash with Evenity is approximately 1%. The decision to continue Evenity is based on its benefits for bone density while monitoring for adverse effects. Continue Evenity injections but monitor for worsening of the rash. Discuss with the dermatologist the possibility of Evenity causing the rash vs cipro. Schedule the next Evenity injection in one month, but be prepared to cancel if the rash worsens/persists.  elevated hgb   Significant fatigue may be related. She is scheduled to see Dr. Colin Benton for further evaluation of anemia and ferritin levels. The decision to follow up with Dr. Colin Benton is to address the fatigue and assess anemia management. .  Abdominal Bloating   She reports abdominal bloating and difficulty with clothing fitting. There is a history of colon surgery for polyps. The bloating is a new symptom, and a non-contrast CT scan of the abdomen is planned to investigate further. The  decision to order a CT scan is to evaluate the cause of bloating/pain. Order a non-contrast CT scan of the abdomen(allergic to dye) to evaluate bloating. ?mass, partial obstruction, other  Hypertension   Hypertension is well-controlled on amlodipine 10 mg. Blood pressure readings at home are good, with no reports of headaches, chest pain, or shortness of breath. The decision to continue current management is based on effective blood pressure control.  Seizure Disorder   Seizure disorder is well-controlled with Tegretol and phenobarb. No recent seizures reported. The  decision to continue current management is based on effective seizure control.  Hypothyroidism   Hypothyroidism is managed with levothyroxine. There is some confusion about the current dosage, with records indicating 0.025 mg but she possibly taking 0.05 mg. She is advised to verify the dosage at home. The decisio n to verify dosage is to ensure appropriate thyroid management. Verify current levothyroxine dosage at home.  General Health Maintenance   She is participating in physical therapy and chair yoga, which is beneficial for her overall health. She is also taking a multivitamin and has been on B12 supplementation in the past. The decision to continue these activities is to support general health and well-being. Continue physical therapy and chair yoga. Monitor B12 levels if fatigue persists.  Follow-up   She is scheduled for follow-up appointments to monitor her various conditions and treatments. The decision to schedule follow-ups is to ensure ongoing management and address any emerging issues. Schedule follow-up in six months. Keep in touch regarding the rash and potential link to Evenity. Schedule Evenity injection in one month, with the option to cancel if necessary.         Return in about 6 months (around 02/08/2024) for chronic follow-up.    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    Angelena Sole, MD

## 2023-08-09 ENCOUNTER — Inpatient Hospital Stay (HOSPITAL_BASED_OUTPATIENT_CLINIC_OR_DEPARTMENT_OTHER): Payer: Medicare Other | Admitting: Hematology & Oncology

## 2023-08-09 ENCOUNTER — Encounter: Payer: Self-pay | Admitting: Hematology & Oncology

## 2023-08-09 ENCOUNTER — Other Ambulatory Visit: Payer: Self-pay

## 2023-08-09 ENCOUNTER — Encounter: Payer: Self-pay | Admitting: *Deleted

## 2023-08-09 ENCOUNTER — Inpatient Hospital Stay: Payer: Medicare Other | Attending: Hematology & Oncology

## 2023-08-09 VITALS — BP 123/51 | HR 77 | Temp 98.9°F | Resp 19 | Ht 62.0 in | Wt 136.0 lb

## 2023-08-09 DIAGNOSIS — D751 Secondary polycythemia: Secondary | ICD-10-CM | POA: Diagnosis not present

## 2023-08-09 DIAGNOSIS — K3184 Gastroparesis: Secondary | ICD-10-CM | POA: Diagnosis not present

## 2023-08-09 DIAGNOSIS — F413 Other mixed anxiety disorders: Secondary | ICD-10-CM

## 2023-08-09 DIAGNOSIS — F32A Depression, unspecified: Secondary | ICD-10-CM | POA: Diagnosis not present

## 2023-08-09 DIAGNOSIS — N1832 Chronic kidney disease, stage 3b: Secondary | ICD-10-CM | POA: Diagnosis not present

## 2023-08-09 DIAGNOSIS — Z7952 Long term (current) use of systemic steroids: Secondary | ICD-10-CM | POA: Diagnosis not present

## 2023-08-09 DIAGNOSIS — R197 Diarrhea, unspecified: Secondary | ICD-10-CM | POA: Diagnosis not present

## 2023-08-09 DIAGNOSIS — G4710 Hypersomnia, unspecified: Secondary | ICD-10-CM | POA: Diagnosis not present

## 2023-08-09 DIAGNOSIS — R799 Abnormal finding of blood chemistry, unspecified: Secondary | ICD-10-CM

## 2023-08-09 DIAGNOSIS — I2693 Single subsegmental pulmonary embolism without acute cor pulmonale: Secondary | ICD-10-CM

## 2023-08-09 DIAGNOSIS — Z86711 Personal history of pulmonary embolism: Secondary | ICD-10-CM | POA: Diagnosis not present

## 2023-08-09 DIAGNOSIS — J441 Chronic obstructive pulmonary disease with (acute) exacerbation: Secondary | ICD-10-CM | POA: Diagnosis not present

## 2023-08-09 DIAGNOSIS — D582 Other hemoglobinopathies: Secondary | ICD-10-CM

## 2023-08-09 DIAGNOSIS — Z7951 Long term (current) use of inhaled steroids: Secondary | ICD-10-CM | POA: Diagnosis not present

## 2023-08-09 DIAGNOSIS — Z9181 History of falling: Secondary | ICD-10-CM | POA: Diagnosis not present

## 2023-08-09 DIAGNOSIS — E039 Hypothyroidism, unspecified: Secondary | ICD-10-CM | POA: Diagnosis not present

## 2023-08-09 DIAGNOSIS — D649 Anemia, unspecified: Secondary | ICD-10-CM | POA: Diagnosis not present

## 2023-08-09 DIAGNOSIS — M810 Age-related osteoporosis without current pathological fracture: Secondary | ICD-10-CM | POA: Diagnosis not present

## 2023-08-09 DIAGNOSIS — K5909 Other constipation: Secondary | ICD-10-CM | POA: Diagnosis not present

## 2023-08-09 DIAGNOSIS — R21 Rash and other nonspecific skin eruption: Secondary | ICD-10-CM | POA: Insufficient documentation

## 2023-08-09 DIAGNOSIS — R5383 Other fatigue: Secondary | ICD-10-CM | POA: Diagnosis not present

## 2023-08-09 DIAGNOSIS — R269 Unspecified abnormalities of gait and mobility: Secondary | ICD-10-CM | POA: Diagnosis not present

## 2023-08-09 DIAGNOSIS — D631 Anemia in chronic kidney disease: Secondary | ICD-10-CM

## 2023-08-09 DIAGNOSIS — I10 Essential (primary) hypertension: Secondary | ICD-10-CM | POA: Diagnosis not present

## 2023-08-09 DIAGNOSIS — Z85828 Personal history of other malignant neoplasm of skin: Secondary | ICD-10-CM | POA: Diagnosis not present

## 2023-08-09 LAB — CBC WITH DIFFERENTIAL (CANCER CENTER ONLY)
Abs Immature Granulocytes: 0.05 10*3/uL (ref 0.00–0.07)
Basophils Absolute: 0.1 10*3/uL (ref 0.0–0.1)
Basophils Relative: 1 %
Eosinophils Absolute: 0.2 10*3/uL (ref 0.0–0.5)
Eosinophils Relative: 2 %
HCT: 41.8 % (ref 36.0–46.0)
Hemoglobin: 13.5 g/dL (ref 12.0–15.0)
Immature Granulocytes: 1 %
Lymphocytes Relative: 22 %
Lymphs Abs: 1.6 10*3/uL (ref 0.7–4.0)
MCH: 31.1 pg (ref 26.0–34.0)
MCHC: 32.3 g/dL (ref 30.0–36.0)
MCV: 96.3 fL (ref 80.0–100.0)
Monocytes Absolute: 0.6 10*3/uL (ref 0.1–1.0)
Monocytes Relative: 8 %
Neutro Abs: 4.6 10*3/uL (ref 1.7–7.7)
Neutrophils Relative %: 66 %
Platelet Count: 201 10*3/uL (ref 150–400)
RBC: 4.34 MIL/uL (ref 3.87–5.11)
RDW: 14 % (ref 11.5–15.5)
WBC Count: 7 10*3/uL (ref 4.0–10.5)
nRBC: 0 % (ref 0.0–0.2)

## 2023-08-09 LAB — CMP (CANCER CENTER ONLY)
ALT: 27 U/L (ref 0–44)
AST: 33 U/L (ref 15–41)
Albumin: 4.1 g/dL (ref 3.5–5.0)
Alkaline Phosphatase: 86 U/L (ref 38–126)
Anion gap: 6 (ref 5–15)
BUN: 34 mg/dL — ABNORMAL HIGH (ref 8–23)
CO2: 30 mmol/L (ref 22–32)
Calcium: 8.9 mg/dL (ref 8.9–10.3)
Chloride: 109 mmol/L (ref 98–111)
Creatinine: 0.93 mg/dL (ref 0.44–1.00)
GFR, Estimated: 59 mL/min — ABNORMAL LOW (ref >60)
Glucose, Bld: 86 mg/dL (ref 70–99)
Potassium: 4.9 mmol/L (ref 3.5–5.1)
Sodium: 145 mmol/L (ref 135–145)
Total Bilirubin: 0.4 mg/dL (ref 0.0–1.2)
Total Protein: 6.7 g/dL (ref 6.5–8.1)

## 2023-08-09 LAB — IRON AND IRON BINDING CAPACITY (CC-WL,HP ONLY)
Iron: 94 ug/dL (ref 28–170)
Saturation Ratios: 31 % (ref 10.4–31.8)
TIBC: 302 ug/dL (ref 250–450)
UIBC: 208 ug/dL (ref 148–442)

## 2023-08-09 LAB — TSH: TSH: 1.367 u[IU]/mL (ref 0.350–4.500)

## 2023-08-09 LAB — MAGNESIUM: Magnesium: 2.1 mg/dL (ref 1.7–2.4)

## 2023-08-09 LAB — RETICULOCYTES
Immature Retic Fract: 5.2 % (ref 2.3–15.9)
RBC.: 4.4 MIL/uL (ref 3.87–5.11)
Retic Count, Absolute: 62.5 10*3/uL (ref 19.0–186.0)
Retic Ct Pct: 1.4 % (ref 0.4–3.1)

## 2023-08-09 LAB — FERRITIN: Ferritin: 54 ng/mL (ref 11–307)

## 2023-08-09 NOTE — Progress Notes (Signed)
 Hematology and Oncology Follow Up Visit  Shalita Notte [id number] 1932/09/21 89 y.o. 08/09/2023   Principle Diagnosis:  Transient erythrocytosis - JAK2 (-)  Current Therapy:   Observation     Interim History:  Ms. Ucci is back for follow-up.  She just does not feel all that well.  She just feels very tired.  I am not sure why she does feel tired.  We are checking her iron levels.  Will check a TSH and also do a magnesium on her.  On her last saw her, her iron saturation was 27%..  She has this rash.  It seems to be on her right leg.  She was seen by dermatology.  I think she is given a steroid cream.  The rash is still there.  I think she was back to see the dermatologist.  It sounds like she may need to have a biopsy.  I do not know if this might be some form of vasculitis.  She has had no issues with fever.  She has had no obvious bleeding.  There is been no change in bowel or bladder habits.  Overall, I would say that her performance status is probably ECOG 2.     Medications:  Current Outpatient Medications:    albuterol (PROVENTIL) (2.5 MG/3ML) 0.083% nebulizer solution, , Disp: , Rfl:    amLODipine (NORVASC) 10 MG tablet, TAKE 1 TABLET(10 MG) BY MOUTH DAILY, Disp: 90 tablet, Rfl: 3   ammonium lactate (LAC-HYDRIN) 12 % lotion, Apply topically daily., Disp: , Rfl:    arformoterol (BROVANA) 15 MCG/2ML NEBU, USE 1 VIAL IN NEBULIZER TWICE DAILY, Disp: , Rfl:    atorvastatin (LIPITOR) 40 MG tablet, TAKE 1 TABLET BY MOUTH EVERY NIGHT AT BEDTIME, Disp: 90 tablet, Rfl: 3   B-D TB SYRINGE 1CC/27GX1/2" 27G X 1/2" 1 ML MISC, USE 2 APPLICATIONS ROUTE ONCE A WEEK., Disp: , Rfl:    budesonide (PULMICORT) 0.25 MG/2ML nebulizer solution, Take by nebulization 2 (two) times daily., Disp: , Rfl:    CALCIUM PO, Take 600 mg by mouth daily., Disp: , Rfl:    carbamazepine (TEGRETOL XR) 100 MG 12 hr tablet, Take by mouth. 2 tablets daily, Disp: , Rfl:    Cholecalciferol 25 MCG (1000 UT)  capsule, Take 1,000 Units by mouth daily., Disp: , Rfl:    Cyanocobalamin (B-12 PO), Take by mouth daily., Disp: , Rfl:    Docusate Sodium (COLACE PO), Take 1 tablet by mouth daily., Disp: , Rfl:    gabapentin (NEURONTIN) 300 MG capsule, Take 300 mg by mouth at bedtime., Disp: , Rfl:    levothyroxine (SYNTHROID) 50 MCG tablet, TAKE 1 TABLET(50 MCG) BY MOUTH DAILY BEFORE BREAKFAST (Patient taking differently: Take 25 mcg by mouth daily. TAKE 1 TABLET(50 MCG) BY MOUTH DAILY BEFORE BREAKFAST), Disp: 90 tablet, Rfl: 3   mometasone (NASONEX) 50 MCG/ACT nasal spray, Place 2 sprays into the nose daily., Disp: , Rfl:    montelukast (SINGULAIR) 10 MG tablet, TAKE 1 TABLET(10 MG) BY MOUTH AT BEDTIME, Disp: 90 tablet, Rfl: 3   PHENobarbital (LUMINAL) 32.4 MG tablet, TAKE 1 TABLET BY MOUTH AT 6 AM AND 2 TABLETS BY MOUTH AT 8 PM., Disp: 270 tablet, Rfl: 1   predniSONE (DELTASONE) 5 MG tablet, Take 10 mg by mouth daily. Patient is on 10 mg daily, Disp: , Rfl:    triamcinolone cream (KENALOG) 0.1 %, Apply topically 2 (two) times daily., Disp: , Rfl:    VENTOLIN HFA 108 (90 Base) MCG/ACT  inhaler, SMARTSIG:1 Puff(s) Via Inhaler Every 4 Hours PRN, Disp: , Rfl:   Current Facility-Administered Medications:    Romosozumab-aqqg (EVENITY) 105 MG/1. injection 210 mg, 210 mg, Subcutaneous, Once, Jeani Sow, MD   [START ON 09/07/2023] Romosozumab-aqqg (EVENITY) 105 MG/1. injection 210 mg, 210 mg, Subcutaneous, Q30 days, Jeani Sow, MD  Allergies:  Allergies  Allergen Reactions   Iodinated Contrast Media Anaphylaxis    Cardiac arrest   Iodine Other (See Comments)    Cardiac arrest As young adult, received iodinated contrast for IVP, went into cardiac arrest, was resuscitated and hospitalized. Has not received iodinated contrast since that episode. Reports no adverse reaction to betadine.    Metrizamide Other (See Comments)    Cardiac arrest     Morphine Other (See Comments)    Cardiac  arrest Patient has previously tolerated hydrocodone, hydromorphone and fentanyl    Morphine And Codeine Other (See Comments)    Cardiac arrest   Cocklebur    Lambs Quarters    Mugwort    Sheep Sorrel    Spiny Pigweed (Amaranthus Spinosus) Skin Test     Past Medical History, Surgical history, Social history, and Family History were reviewed and updated.  Review of Systems: Review of Systems  Constitutional: Negative.  Negative for appetite change, fatigue, fever and unexpected weight change.  HENT:  Negative.  Negative for lump/mass, mouth sores, sore throat and trouble swallowing.   Eyes: Negative.   Respiratory: Negative.  Negative for cough, hemoptysis and shortness of breath.   Cardiovascular: Negative.  Negative for leg swelling and palpitations.  Gastrointestinal:  Positive for diarrhea. Negative for abdominal distention, abdominal pain, blood in stool, constipation, nausea and vomiting.  Endocrine: Negative.   Genitourinary: Negative.  Negative for bladder incontinence, dysuria, frequency and hematuria.   Musculoskeletal:  Positive for gait problem. Negative for arthralgias, back pain and myalgias.  Skin: Negative.  Negative for itching and rash.  Neurological:  Positive for extremity weakness and gait problem. Negative for dizziness, headaches, numbness, seizures and speech difficulty.  Hematological: Negative.  Does not bruise/bleed easily.  Psychiatric/Behavioral: Negative.  Negative for depression and sleep disturbance. The patient is not nervous/anxious.     Physical Exam:  height is 5\' 2"  (1.575 m) and weight is 136 lb (61.7 kg). Her oral temperature is 98.9 F (37.2 C). Her blood pressure is 123/51 (abnormal) and her pulse is 77. Her respiration is 19 and oxygen saturation is 98%.   Wt Readings from Last 3 Encounters:  08/09/23 136 lb (61.7 kg)  08/08/23 135 lb 6 oz (61.4 kg)  07/05/23 132 lb (59.9 kg)    Physical Exam Vitals reviewed.  HENT:     Head:  Normocephalic and atraumatic.  Eyes:     Pupils: Pupils are equal, round, and reactive to light.  Cardiovascular:     Rate and Rhythm: Normal rate and regular rhythm.     Heart sounds: Normal heart sounds.  Pulmonary:     Effort: Pulmonary effort is normal.     Breath sounds: Normal breath sounds.  Abdominal:     General: Bowel sounds are normal.     Palpations: Abdomen is soft.  Musculoskeletal:        General: No tenderness or deformity. Normal range of motion.     Cervical back: Normal range of motion.  Lymphadenopathy:     Cervical: No cervical adenopathy.  Skin:    General: Skin is warm and dry.     Findings: No erythema or  rash.  Neurological:     Mental Status: She is alert and oriented to person, place, and time.  Psychiatric:        Behavior: Behavior normal.        Thought Content: Thought content normal.        Judgment: Judgment normal.     Lab Results  Component Value Date   WBC 7.0 08/09/2023   HGB 13.5 08/09/2023   HCT 41.8 08/09/2023   MCV 96.3 08/09/2023   PLT 201 08/09/2023     Chemistry      Component Value Date/Time   NA 145 08/09/2023 1129   K 4.9 08/09/2023 1129   CL 109 08/09/2023 1129   CO2 30 08/09/2023 1129   BUN 34 (H) 08/09/2023 1129   CREATININE 0.93 08/09/2023 1129   CREATININE 0.78 06/27/2012 1139      Component Value Date/Time   CALCIUM 8.9 08/09/2023 1129   ALKPHOS 86 08/09/2023 1129   AST 33 08/09/2023 1129   ALT 27 08/09/2023 1129   BILITOT 0.4 08/09/2023 1129      Impression and Plan: Ms. Florendo is a very charming 89 year old white female.  She had transient erythrocytosis.  So far, her testing really has not been positive for any thing that looks like a myeloproliferative disorder.  We will see what her iron studies look like.  I did state the fact that she has issues with being so tired.  I would like to see her back probably about 6 weeks or so.     Josph Macho, MD 3/13/202512:40 PM

## 2023-08-10 ENCOUNTER — Encounter: Payer: Self-pay | Admitting: *Deleted

## 2023-08-13 ENCOUNTER — Ambulatory Visit
Admission: RE | Admit: 2023-08-13 | Discharge: 2023-08-13 | Disposition: A | Discharge status: Home / Self Care | Payer: Medicare Other | Source: Ambulatory Visit | Attending: Family Medicine | Admitting: Family Medicine

## 2023-08-13 DIAGNOSIS — Z1231 Encounter for screening mammogram for malignant neoplasm of breast: Secondary | ICD-10-CM

## 2023-08-14 DIAGNOSIS — D649 Anemia, unspecified: Secondary | ICD-10-CM | POA: Diagnosis not present

## 2023-08-14 DIAGNOSIS — J441 Chronic obstructive pulmonary disease with (acute) exacerbation: Secondary | ICD-10-CM | POA: Diagnosis not present

## 2023-08-14 DIAGNOSIS — M810 Age-related osteoporosis without current pathological fracture: Secondary | ICD-10-CM | POA: Diagnosis not present

## 2023-08-14 DIAGNOSIS — E039 Hypothyroidism, unspecified: Secondary | ICD-10-CM | POA: Diagnosis not present

## 2023-08-14 DIAGNOSIS — F32A Depression, unspecified: Secondary | ICD-10-CM | POA: Diagnosis not present

## 2023-08-14 DIAGNOSIS — D692 Other nonthrombocytopenic purpura: Secondary | ICD-10-CM | POA: Diagnosis not present

## 2023-08-14 DIAGNOSIS — I10 Essential (primary) hypertension: Secondary | ICD-10-CM | POA: Diagnosis not present

## 2023-08-15 ENCOUNTER — Encounter: Payer: Self-pay | Admitting: Family Medicine

## 2023-08-16 ENCOUNTER — Other Ambulatory Visit

## 2023-08-20 ENCOUNTER — Other Ambulatory Visit

## 2023-08-20 ENCOUNTER — Ambulatory Visit
Admission: RE | Admit: 2023-08-20 | Discharge: 2023-08-20 | Disposition: A | Discharge status: Home / Self Care | Source: Ambulatory Visit | Attending: Family Medicine | Admitting: Family Medicine

## 2023-08-20 DIAGNOSIS — K449 Diaphragmatic hernia without obstruction or gangrene: Secondary | ICD-10-CM | POA: Diagnosis not present

## 2023-08-20 DIAGNOSIS — R1084 Generalized abdominal pain: Secondary | ICD-10-CM

## 2023-08-20 DIAGNOSIS — R109 Unspecified abdominal pain: Secondary | ICD-10-CM | POA: Diagnosis not present

## 2023-08-20 DIAGNOSIS — I700 Atherosclerosis of aorta: Secondary | ICD-10-CM | POA: Diagnosis not present

## 2023-08-20 DIAGNOSIS — N200 Calculus of kidney: Secondary | ICD-10-CM | POA: Diagnosis not present

## 2023-08-23 ENCOUNTER — Other Ambulatory Visit: Payer: Self-pay | Admitting: *Deleted

## 2023-08-23 ENCOUNTER — Encounter: Payer: Self-pay | Admitting: Family Medicine

## 2023-08-23 NOTE — Progress Notes (Signed)
 CT.  There is a stone in the kidney near the exit point.  Not sure if causing a problem or going to.  Also, the bladder appears large.  So, refer to urology

## 2023-08-28 ENCOUNTER — Telehealth: Payer: Self-pay | Admitting: Family Medicine

## 2023-08-28 NOTE — Telephone Encounter (Signed)
Placed on providers desk

## 2023-08-28 NOTE — Telephone Encounter (Signed)
 [facility] faxed Home Health Certificate (Order [place] [id number]), to be filled out by provider. Patient requested to send it back via Fax within 5-days. Document is located in providers tray at front office.Please advise at  [phone]

## 2023-08-30 ENCOUNTER — Telehealth: Payer: Self-pay | Admitting: *Deleted

## 2023-08-30 DIAGNOSIS — Z85828 Personal history of other malignant neoplasm of skin: Secondary | ICD-10-CM | POA: Diagnosis not present

## 2023-08-30 DIAGNOSIS — J441 Chronic obstructive pulmonary disease with (acute) exacerbation: Secondary | ICD-10-CM | POA: Diagnosis not present

## 2023-08-30 DIAGNOSIS — K5909 Other constipation: Secondary | ICD-10-CM | POA: Diagnosis not present

## 2023-08-30 DIAGNOSIS — Z86711 Personal history of pulmonary embolism: Secondary | ICD-10-CM | POA: Diagnosis not present

## 2023-08-30 DIAGNOSIS — Z7951 Long term (current) use of inhaled steroids: Secondary | ICD-10-CM | POA: Diagnosis not present

## 2023-08-30 DIAGNOSIS — D649 Anemia, unspecified: Secondary | ICD-10-CM | POA: Diagnosis not present

## 2023-08-30 DIAGNOSIS — Z9181 History of falling: Secondary | ICD-10-CM | POA: Diagnosis not present

## 2023-08-30 DIAGNOSIS — F32A Depression, unspecified: Secondary | ICD-10-CM | POA: Diagnosis not present

## 2023-08-30 DIAGNOSIS — M810 Age-related osteoporosis without current pathological fracture: Secondary | ICD-10-CM | POA: Diagnosis not present

## 2023-08-30 DIAGNOSIS — I10 Essential (primary) hypertension: Secondary | ICD-10-CM | POA: Diagnosis not present

## 2023-08-30 DIAGNOSIS — Z7952 Long term (current) use of systemic steroids: Secondary | ICD-10-CM | POA: Diagnosis not present

## 2023-08-30 DIAGNOSIS — E039 Hypothyroidism, unspecified: Secondary | ICD-10-CM | POA: Diagnosis not present

## 2023-08-30 NOTE — Telephone Encounter (Signed)
 Copied from CRM [id number]. Topic: Clinical - Lab/Test Results >> Aug 30, 2023  4:21 PM Abigail D wrote: Reason for CRM: PT had abdominal scan done at [facility] from Dr. Ruthine Dose, PT wants to request Dr. Ruthine Dose sends these results to Dr. Berneice Heinrich @ [facility] [phone]).  Faxed to Dr. Kathrynn Running as requested.

## 2023-08-30 NOTE — Telephone Encounter (Signed)
 Form faxed

## 2023-08-31 DIAGNOSIS — F32A Depression, unspecified: Secondary | ICD-10-CM | POA: Diagnosis not present

## 2023-08-31 DIAGNOSIS — M810 Age-related osteoporosis without current pathological fracture: Secondary | ICD-10-CM | POA: Diagnosis not present

## 2023-08-31 DIAGNOSIS — D649 Anemia, unspecified: Secondary | ICD-10-CM | POA: Diagnosis not present

## 2023-08-31 DIAGNOSIS — E039 Hypothyroidism, unspecified: Secondary | ICD-10-CM | POA: Diagnosis not present

## 2023-08-31 DIAGNOSIS — I10 Essential (primary) hypertension: Secondary | ICD-10-CM | POA: Diagnosis not present

## 2023-08-31 DIAGNOSIS — J441 Chronic obstructive pulmonary disease with (acute) exacerbation: Secondary | ICD-10-CM | POA: Diagnosis not present

## 2023-09-04 DIAGNOSIS — N281 Cyst of kidney, acquired: Secondary | ICD-10-CM | POA: Diagnosis not present

## 2023-09-04 DIAGNOSIS — N319 Neuromuscular dysfunction of bladder, unspecified: Secondary | ICD-10-CM | POA: Diagnosis not present

## 2023-09-04 DIAGNOSIS — N202 Calculus of kidney with calculus of ureter: Secondary | ICD-10-CM | POA: Diagnosis not present

## 2023-09-06 ENCOUNTER — Other Ambulatory Visit: Payer: Self-pay | Admitting: Urology

## 2023-09-11 ENCOUNTER — Ambulatory Visit (INDEPENDENT_AMBULATORY_CARE_PROVIDER_SITE_OTHER)

## 2023-09-11 DIAGNOSIS — M810 Age-related osteoporosis without current pathological fracture: Secondary | ICD-10-CM | POA: Diagnosis not present

## 2023-09-11 MED ORDER — ROMOSOZUMAB-AQQG 105 MG/1.17ML ~~LOC~~ SOSY
210.0000 mg | PREFILLED_SYRINGE | SUBCUTANEOUS | Status: AC
  Administered 2023-10-16: 210 mg via SUBCUTANEOUS

## 2023-09-11 NOTE — Progress Notes (Addendum)
 Patient is in office today for a nurse visit for  Monthly Evenity injection , per PCP's order. Injections were given in bilateral arms subcutaneous. Patient tolerated well.

## 2023-09-12 NOTE — Progress Notes (Signed)
 COVID Vaccine Completed:  Yes  Date of COVID positive in last 90 days:  PCP - Glenetta Lane, MD Cardiologist -  Pulmonologist -   Chest x-ray -  EKG -  Stress Test - 11-01-06 Epic ECHO - 11-15-20 Epic Cardiac Cath -  Pacemaker/ICD device last checked: Spinal Cord Stimulator:  Bowel Prep -   Sleep Study -  CPAP -   Fasting Blood Sugar -  Checks Blood Sugar _____ times a day  Last dose of GLP1 agonist-  N/A GLP1 instructions:  Hold 7 days before surgery    Last dose of SGLT-2 inhibitors-  N/A SGLT-2 instructions:  Hold 3 days before surgery    Blood Thinner Instructions:  Last dose:   Time: Aspirin Instructions: Last Dose:  Activity level:  Can go up a flight of stairs and perform activities of daily living without stopping and without symptoms of chest pain or shortness of breath.  Able to exercise without symptoms  Unable to go up a flight of stairs without symptoms of     Anesthesia review:  COPD, seizures  Patient denies shortness of breath, fever, cough and chest pain at PAT appointment  Patient verbalized understanding of instructions that were given to them at the PAT appointment. Patient was also instructed that they will need to review over the PAT instructions again at home before surgery.

## 2023-09-12 NOTE — Patient Instructions (Signed)
 SURGICAL WAITING ROOM VISITATION Patients having surgery or a procedure may have no more than 2 support people in the waiting area - these visitors may rotate.    Children under the age of 45 must have an adult with them who is not the patient.  If the patient needs to stay at the hospital during part of their recovery, the visitor guidelines for inpatient rooms apply. Pre-op nurse will coordinate an appropriate time for 1 support person to accompany patient in pre-op.  This support person may not rotate.    Please refer to the [facility] website for the visitor guidelines for Inpatients (after your surgery is over and you are in a regular room).       Your procedure is scheduled on: 09-19-23   Report to [facility] Main Entrance    Report to admitting at 9:30 AM   Call this number if you have problems the morning of surgery [phone]   Do not eat food or drink liquids: After Midnight.          If you have questions, please contact your surgeon's office.   FOLLOW  ANY ADDITIONAL PRE OP INSTRUCTIONS YOU RECEIVED FROM YOUR SURGEON'S OFFICE!!!     Oral Hygiene is also important to reduce your risk of infection.                                    Remember - BRUSH YOUR TEETH THE MORNING OF SURGERY WITH YOUR REGULAR TOOTHPASTE   Do NOT smoke after Midnight   Take these medicines the morning of surgery with A SIP OF WATER:    Amlodipine   Carbamazepine (Tegretol)   Levothyroxine   Phenobarbital   Prednisone  Stop all vitamins and herbal supplements 7 days before surgery  Bring CPAP mask and tubing day of surgery.                              You may not have any metal on your body including hair pins, jewelry, and body piercing             Do not wear make-up, lotions, powders, perfumes, or deodorant  Do not wear nail polish including gel and S&S, artificial/acrylic nails, or any other type of covering on natural nails including finger and toenails. If you have  artificial nails, gel coating, etc. that needs to be removed by a nail salon please have this removed prior to surgery or surgery may need to be canceled/ delayed if the surgeon/ anesthesia feels like they are unable to be safely monitored.   Do not shave  48 hours prior to surgery.               Do not bring valuables to the hospital. [place] IS NOT RESPONSIBLE   FOR VALUABLES.   Contacts, dentures or bridgework may not be worn into surgery.  DO NOT BRING YOUR HOME MEDICATIONS TO THE HOSPITAL. PHARMACY WILL DISPENSE MEDICATIONS LISTED ON YOUR MEDICATION LIST TO YOU DURING YOUR ADMISSION IN THE HOSPITAL!    Patients discharged on the day of surgery will not be allowed to drive home.  Someone NEEDS to stay with you for the first 24 hours after anesthesia.   Special Instructions: Bring a copy of your healthcare power of attorney and living will documents the day of surgery if you haven't  scanned them before.              Please read over the following fact sheets you were given: IF YOU HAVE QUESTIONS ABOUT YOUR PRE-OP INSTRUCTIONS PLEASE CALL [phone] Gwen  If you received a COVID test during your pre-op visit  it is requested that you wear a mask when out in public, stay away from anyone that may not be feeling well and notify your surgeon if you develop symptoms. If you test positive for Covid or have been in contact with anyone that has tested positive in the last 10 days please notify you surgeon.   - Preparing for Surgery Before surgery, you can play an important role.  Because skin is not sterile, your skin needs to be as free of germs as possible.  You can reduce the number of germs on your skin by washing with CHG (chlorahexidine gluconate) soap before surgery.  CHG is an antiseptic cleaner which kills germs and bonds with the skin to continue killing germs even after washing. Please DO NOT use if you have an allergy to CHG or antibacterial soaps.  If your skin becomes  reddened/irritated stop using the CHG and inform your nurse when you arrive at Short Stay. Do not shave (including legs and underarms) for at least 48 hours prior to the first CHG shower.  You may shave your face/neck.  Please follow these instructions carefully:  1.  Shower with CHG Soap the night before surgery and the  morning of surgery.  2.  If you choose to wash your hair, wash your hair first as usual with your normal  shampoo.  3.  After you shampoo, rinse your hair and body thoroughly to remove the shampoo.                             4.  Use CHG as you would any other liquid soap.  You can apply chg directly to the skin and wash.  Gently with a scrungie or clean washcloth.  5.  Apply the CHG Soap to your body ONLY FROM THE NECK DOWN.   Do   not use on face/ open                           Wound or open sores. Avoid contact with eyes, ears mouth and   genitals (private parts).                       Wash face,  Genitals (private parts) with your normal soap.             6.  Wash thoroughly, paying special attention to the area where your    surgery  will be performed.  7.  Thoroughly rinse your body with warm water from the neck down.  8.  DO NOT shower/wash with your normal soap after using and rinsing off the CHG Soap.                9.  Pat yourself dry with a clean towel.            10.  Wear clean pajamas.            11.  Place clean sheets on your bed the night of your first shower and do not  sleep with pets. Day of Surgery : Do not apply any lotions/deodorants  the morning of surgery.  Please wear clean clothes to the hospital/surgery center.  FAILURE TO FOLLOW THESE INSTRUCTIONS MAY RESULT IN THE CANCELLATION OF YOUR SURGERY  PATIENT SIGNATURE_________________________________  NURSE SIGNATURE__________________________________  ________________________________________________________________________

## 2023-09-13 ENCOUNTER — Encounter (HOSPITAL_COMMUNITY)
Admission: RE | Admit: 2023-09-13 | Discharge: 2023-09-13 | Disposition: A | Discharge status: Home / Self Care | Source: Ambulatory Visit | Attending: Urology | Admitting: Urology

## 2023-09-13 ENCOUNTER — Encounter (HOSPITAL_COMMUNITY): Payer: Self-pay

## 2023-09-13 ENCOUNTER — Other Ambulatory Visit: Payer: Self-pay

## 2023-09-13 VITALS — BP 143/69 | HR 82 | Temp 98.2°F | Resp 16 | Ht 63.0 in | Wt 128.0 lb

## 2023-09-13 DIAGNOSIS — Z01818 Encounter for other preprocedural examination: Secondary | ICD-10-CM | POA: Diagnosis not present

## 2023-09-13 DIAGNOSIS — D649 Anemia, unspecified: Secondary | ICD-10-CM | POA: Insufficient documentation

## 2023-09-13 DIAGNOSIS — I10 Essential (primary) hypertension: Secondary | ICD-10-CM | POA: Diagnosis not present

## 2023-09-13 HISTORY — DX: Personal history of urinary calculi: Z87.442

## 2023-09-13 LAB — BASIC METABOLIC PANEL WITH GFR
Anion gap: 7 (ref 5–15)
BUN: 36 mg/dL — ABNORMAL HIGH (ref 8–23)
CO2: 24 mmol/L (ref 22–32)
Calcium: 8.4 mg/dL — ABNORMAL LOW (ref 8.9–10.3)
Chloride: 113 mmol/L — ABNORMAL HIGH (ref 98–111)
Creatinine, Ser: 0.79 mg/dL (ref 0.44–1.00)
GFR, Estimated: >60 mL/min (ref >60)
Glucose, Bld: 98 mg/dL (ref 70–99)
Potassium: 4.3 mmol/L (ref 3.5–5.1)
Sodium: 144 mmol/L (ref 135–145)

## 2023-09-13 LAB — CBC
HCT: 42.2 % (ref 36.0–46.0)
Hemoglobin: 13.5 g/dL (ref 12.0–15.0)
MCH: 31 pg (ref 26.0–34.0)
MCHC: 32 g/dL (ref 30.0–36.0)
MCV: 96.8 fL (ref 80.0–100.0)
Platelets: 193 10*3/uL (ref 150–400)
RBC: 4.36 MIL/uL (ref 3.87–5.11)
RDW: 13.2 % (ref 11.5–15.5)
WBC: 4.6 10*3/uL (ref 4.0–10.5)
nRBC: 0 % (ref 0.0–0.2)

## 2023-09-17 ENCOUNTER — Encounter (HOSPITAL_COMMUNITY): Payer: Self-pay

## 2023-09-17 NOTE — Progress Notes (Signed)
 Case: [id number] Date/Time: 09/19/23 1130   Procedure: CYSTOSCOPY/URETEROSCOPY/HOLMIUM LASER/STENT PLACEMENT (Right)   Anesthesia type: General   Diagnosis: Right ureteral stone [N20.1]   Pre-op diagnosis: RIGHT URETERAL STONE   Location: [facility] PROCEDURE [facility] / [facility]   Surgeons: Melody Spurling., MD       DISCUSSION: [name] is an 89 yo female who presents to PAT prior to surgery above. PMH of former smoking, HTN, asthma, COPD, hx of PE (remotely), hx of seizures, hx of CIPD, frequent falls, GERD, moderate hiatal hernia, hypothyroid, neurogenic bladder, s/p C4-6 ACDF (2014).  Patient with hx of asthma and COPD and is followed by Pulmonology at [facility]. Last seen on 07/09/23 for a COPD exacerbation. She is maintained on inhalers and prednisone . She denies CP/SOB at PAT visit. She reported to PCP she needs to sleep elevated due to chest congestion from COPD.  Pt with hx of seizures. Follows with Neurology at [facility]. Takes Phenobarbital  and Tegretol .   Last seen by PCP on 08/08/23. BP controlled. COPD stable. She has frequent falls and balance issues due to neuropathy and requires a rollator. She reported significant fatigue and cause is unknown.  VS: BP (!) 143/69   Pulse 82   Temp 36.8 C (Oral)   Resp 16   Ht 5\' 3"  (1.6 m)   Wt 58.1 kg   SpO2 100%   BMI 22.67 kg/m   PROVIDERS: [name], MD   LABS: Labs reviewed: Acceptable for surgery. (all labs ordered are listed, but only abnormal results are displayed)  Labs Reviewed  BASIC METABOLIC PANEL WITH GFR - Abnormal; Notable for the following components:      Result Value   Chloride 113 (*)    BUN 36 (*)    Calcium  8.4 (*)    All other components within normal limits  CBC     IMAGES:  CT A/P 08/20/23:  IMPRESSION: *7 mm stone of the right ureteropelvic junction. No hydronephrosis. *Bilateral nephrolithiasis. *Bladder appears distended almost to the umbilicus level. No  bladder stones. *Medium-sized hiatal hernia. *Moderate amount of residual fecal material throughout the colon. *Aortic atherosclerosis.   EKG 09/13/23:  Normal sinus rhythm, rate 83 Left axis deviation Low voltage QRS Cannot rule out Inferior infarct , age undetermined Cannot rule out Anterior infarct , age undetermined Abnormal ECG When compared with ECG of 03-May-2015 14:52, No significant change was found  CV: Echo 11/15/2020:  IMPRESSIONS     1. Left ventricular ejection fraction, by estimation, is 60 to 65%. The  left ventricle has normal function. The left ventricle has no regional  wall motion abnormalities. Left ventricular diastolic parameters are  indeterminate.   2. Right ventricular systolic function is normal. The right ventricular  size is normal. There is normal pulmonary artery systolic pressure.   3. The mitral valve is grossly normal. Trivial mitral valve  regurgitation.   4. The aortic valve is grossly normal. Aortic valve regurgitation is  mild.   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 (HCC) 03/2011   chronic sinusitis 08/08/2012   Colon polyp    Constipation    COPD (chronic obstructive pulmonary disease) (HCC)    Cystocele 09/16/2012   GERD (gastroesophageal  reflux disease)    History of kidney stones    Hyperlipidemia    Hypertension    Hypothyroid    Neurogenic bladder 2013   husband caths pt, manages with timed void   Osteoporosis    Pulmonary embolism (HCC)    1980s   Raynaud phenomenon    Renal cyst    Non-complex, contrast MRI 2013 with L>R non-complex cysts, dominant 3.7 left lower pole   Seizures (HCC)    1990 last seizures on meds Phenobarb   Shingles late 90's   Skin cancer    squamous cell   Thyroid  nodule     Right thyroid  lobe, only seen on Sagittal imaging measures 2.3 cm in craniocaudal dimension and appears stable   Tubular adenoma    Vitamin D  deficiency     Past Surgical History:  Procedure Laterality Date   ABDOMINAL HYSTERECTOMY     ANTERIOR FUSION CLIVUS-C2 EXTRAORAL W/ ODONTOID EXCISION  12/2012   APPENDECTOMY     ARTHROSCOPIC REPAIR ACL Left 1976   BASAL CELL CARCINOMA EXCISION     face   BLADDER SUSPENSION     BREAST DUCTAL SYSTEM EXCISION Right 01/15/2014   Procedure: EXCISION DUCTAL SYSTEM RIGHT BREAST;  Surgeon: Lockie Rima, MD;  Location: [facility];  Service: General;  Laterality: Right;   BREAST EXCISIONAL BIOPSY Right    CARPAL TUNNEL RELEASE Bilateral    B, mult times   CATARACT EXTRACTION     bilateral   cervical neck ablation     x 7, C3-C6/3 screws and plate   CESAREAN SECTION     CYSTOCELE REPAIR     DILATION AND CURETTAGE OF UTERUS     duptyren's contracture right hand     FINGER ARTHRODESIS Right 05/06/2015   Procedure: RIGHT RING PROXIMAL INTERPHALANGEAL FUSION (PIP);  Surgeon: Brunilda Capra, MD;  Location: [facility];  Service: Orthopedics;  Laterality: Right;   FINGER GANGLION CYST EXCISION     right   HEMICOLECTOMY Right    JOINT REPLACEMENT     right and left basal joints of thumbs   left finger fusion     3 fingers on left hand/one finger right   left ovary and tube removed     NASAL POLYP SURGERY     4 sinus surgeries   NASAL SEPTUM SURGERY     PANNICULECTOMY     PROXIMAL INTERPHALANGEAL FUSION (PIP) Left 09/09/2013   Procedure: FUSION LEFT INDEX PROXIMAL INTERPHALANGEAL JOINT (PIP);  Surgeon: Amelie Baize., MD;  Location: [facility];  Service: Orthopedics;  Laterality: Left;   right median nerve decompression     SHOULDER ARTHROSCOPY     x2 left, 1 right   sinus surgeries     x 4   squamous lesions removed     neck and face   STERIOD INJECTION Right 05/06/2015   Procedure: STEROID  INJECTION;  Surgeon: Brunilda Capra, MD;  Location: [facility];  Service: Orthopedics;  Laterality: Right;  Right Index Finger Proximal InterPhalangeal Joint Injection   TONSILLECTOMY AND ADENOIDECTOMY     TUBAL LIGATION     vocal polyps removed      MEDICATIONS:  albuterol  (PROVENTIL ) (2.5 MG/3ML) 0.083% nebulizer solution   amLODipine  (NORVASC ) 10 MG tablet   ammonium lactate (LAC-HYDRIN) 12 % lotion   arformoterol  (BROVANA ) 15 MCG/2ML NEBU   atorvastatin  (LIPITOR) 40 MG tablet   budesonide  (PULMICORT ) 0.25 MG/2ML nebulizer solution   CALCIUM  PO  carbamazepine  (TEGRETOL  XR) 100 MG 12 hr tablet   Cholecalciferol 25 MCG (1000 UT) capsule   Cyanocobalamin  (B-12 PO)   Docusate Sodium (COLACE PO)   gabapentin (NEURONTIN) 300 MG capsule   levothyroxine  (SYNTHROID ) 50 MCG tablet   mometasone  (NASONEX ) 50 MCG/ACT nasal spray   montelukast  (SINGULAIR ) 10 MG tablet   NONFORMULARY OR COMPOUNDED ITEM   PHENobarbital  (LUMINAL) 32.4 MG tablet   predniSONE  (DELTASONE ) 5 MG tablet   triamcinolone  cream (KENALOG ) 0.1 %   VENTOLIN  HFA 108 (90 Base) MCG/ACT inhaler    Romosozumab -aqqg (EVENITY ) 105 MG/1. injection 210 mg   [START ON 10/11/2023] Romosozumab -aqqg (EVENITY ) 105 MG/1. injection 210 mg   Haedyn Ancrum M Saphia Vanderford, PA-C [facility] Short Stay/Anesthesiology [facility] Phone [phone] 09/18/2023 11:10 AM

## 2023-09-18 NOTE — Anesthesia Preprocedure Evaluation (Addendum)
 Anesthesia Evaluation  Patient identified by MRN, date of birth, ID band Patient awake    Reviewed: Allergy & Precautions, NPO status , Patient's Chart, lab work & pertinent test results  History of Anesthesia Complications Negative for: history of anesthetic complications  Airway Mallampati: III  TM Distance: >3 FB Neck ROM: Limited    Dental  (+) Dental Advisory Given, Teeth Intact   Pulmonary asthma , COPD,  COPD inhaler, former smoker, PE   Pulmonary exam normal        Cardiovascular hypertension, Pt. on medications Normal cardiovascular exam   '22 TTE - EF 60 to 65%. Trivial mitral valve regurgitation. Aortic valve regurgitation is mild.     Neuro/Psych Seizures -, Well Controlled,  PSYCHIATRIC DISORDERS  Depression     Neuromuscular disease (Chronic inflammatory demyelinating polyneuropathy)    GI/Hepatic Neg liver ROS,GERD  Controlled,,  Endo/Other  Hypothyroidism    Renal/GU negative Renal ROS    Neurogenic bladder, self caths      Musculoskeletal  (+) Arthritis ,    Abdominal   Peds  Hematology negative hematology ROS (+)   Anesthesia Other Findings Chronic fatigue syndrome Raynaud's phenomena   Reproductive/Obstetrics                             Anesthesia Physical Anesthesia Plan  ASA: 3  Anesthesia Plan: General   Post-op Pain Management: Tylenol  PO (pre-op)*   Induction: Intravenous  PONV Risk Score and Plan: 3 and Treatment may vary due to age or medical condition, Ondansetron  and Propofol  infusion  Airway Management Planned: LMA  Additional Equipment: None  Intra-op Plan:   Post-operative Plan: Extubation in OR  Informed Consent: I have reviewed the patients History and Physical, chart, labs and discussed the procedure including the risks, benefits and alternatives for the proposed anesthesia with the patient or authorized representative who has  indicated his/her understanding and acceptance.     Dental advisory given  Plan Discussed with: CRNA and Anesthesiologist  Anesthesia Plan Comments: (See PAT note from 4/17)        Anesthesia Quick Evaluation

## 2023-09-19 ENCOUNTER — Ambulatory Visit (HOSPITAL_COMMUNITY)
Admission: RE | Admit: 2023-09-19 | Discharge: 2023-09-19 | Disposition: A | Discharge status: Home / Self Care | Source: Ambulatory Visit | Attending: Urology | Admitting: Urology

## 2023-09-19 ENCOUNTER — Encounter (HOSPITAL_COMMUNITY)
Admission: RE | Disposition: A | Discharge status: Home / Self Care | Payer: Self-pay | Source: Ambulatory Visit | Attending: Urology

## 2023-09-19 ENCOUNTER — Ambulatory Visit (HOSPITAL_BASED_OUTPATIENT_CLINIC_OR_DEPARTMENT_OTHER): Admitting: Anesthesiology

## 2023-09-19 ENCOUNTER — Ambulatory Visit (HOSPITAL_COMMUNITY)

## 2023-09-19 ENCOUNTER — Ambulatory Visit (HOSPITAL_COMMUNITY): Payer: Self-pay | Admitting: Medical

## 2023-09-19 ENCOUNTER — Encounter (HOSPITAL_COMMUNITY): Payer: Self-pay | Admitting: Urology

## 2023-09-19 DIAGNOSIS — N200 Calculus of kidney: Secondary | ICD-10-CM | POA: Diagnosis not present

## 2023-09-19 DIAGNOSIS — G6181 Chronic inflammatory demyelinating polyneuritis: Secondary | ICD-10-CM | POA: Diagnosis not present

## 2023-09-19 DIAGNOSIS — I10 Essential (primary) hypertension: Secondary | ICD-10-CM

## 2023-09-19 DIAGNOSIS — Z9049 Acquired absence of other specified parts of digestive tract: Secondary | ICD-10-CM | POA: Diagnosis not present

## 2023-09-19 DIAGNOSIS — R270 Ataxia, unspecified: Secondary | ICD-10-CM | POA: Diagnosis not present

## 2023-09-19 DIAGNOSIS — Z87891 Personal history of nicotine dependence: Secondary | ICD-10-CM | POA: Diagnosis not present

## 2023-09-19 DIAGNOSIS — N201 Calculus of ureter: Secondary | ICD-10-CM

## 2023-09-19 DIAGNOSIS — N202 Calculus of kidney with calculus of ureter: Secondary | ICD-10-CM | POA: Diagnosis not present

## 2023-09-19 DIAGNOSIS — E039 Hypothyroidism, unspecified: Secondary | ICD-10-CM

## 2023-09-19 DIAGNOSIS — G959 Disease of spinal cord, unspecified: Secondary | ICD-10-CM | POA: Insufficient documentation

## 2023-09-19 DIAGNOSIS — J449 Chronic obstructive pulmonary disease, unspecified: Secondary | ICD-10-CM | POA: Diagnosis not present

## 2023-09-19 DIAGNOSIS — Z79899 Other long term (current) drug therapy: Secondary | ICD-10-CM | POA: Insufficient documentation

## 2023-09-19 DIAGNOSIS — M4800 Spinal stenosis, site unspecified: Secondary | ICD-10-CM | POA: Insufficient documentation

## 2023-09-19 DIAGNOSIS — K219 Gastro-esophageal reflux disease without esophagitis: Secondary | ICD-10-CM | POA: Insufficient documentation

## 2023-09-19 DIAGNOSIS — G40909 Epilepsy, unspecified, not intractable, without status epilepticus: Secondary | ICD-10-CM | POA: Insufficient documentation

## 2023-09-19 DIAGNOSIS — J4489 Other specified chronic obstructive pulmonary disease: Secondary | ICD-10-CM | POA: Insufficient documentation

## 2023-09-19 HISTORY — PX: CYSTOSCOPY/URETEROSCOPY/HOLMIUM LASER/STENT PLACEMENT: SHX6546

## 2023-09-19 SURGERY — CYSTOSCOPY/URETEROSCOPY/HOLMIUM LASER/STENT PLACEMENT
Anesthesia: General | Laterality: Right

## 2023-09-19 MED ORDER — OXYCODONE HCL 5 MG/5ML PO SOLN: 5.0000 mg | Freq: Once | ORAL | Status: AC | PRN

## 2023-09-19 MED ORDER — CHLORHEXIDINE GLUCONATE 0.12 % MT SOLN
15.0000 mL | Freq: Once | OROMUCOSAL | Status: AC
  Administered 2023-09-19: 15 mL via OROMUCOSAL

## 2023-09-19 MED ORDER — OXYCODONE HCL 5 MG PO TABS: 5.0000 mg | ORAL_TABLET | Freq: Once | ORAL | Status: AC | PRN

## 2023-09-19 MED ORDER — SODIUM CHLORIDE 0.9 % IR SOLN
Status: DC | PRN
  Administered 2023-09-19: 1000 mL via INTRAVESICAL

## 2023-09-19 MED ORDER — ONDANSETRON HCL 4 MG/2ML IJ SOLN
INTRAMUSCULAR | Status: AC
  Filled 2023-09-19: qty 2

## 2023-09-19 MED ORDER — ORAL CARE MOUTH RINSE: 15.0000 mL | Freq: Once | OROMUCOSAL | Status: AC

## 2023-09-19 MED ORDER — TRAMADOL HCL 50 MG PO TABS: 50.0000 mg | ORAL_TABLET | Freq: Four times a day (QID) | ORAL | 0 refills | Status: DC | PRN

## 2023-09-19 MED ORDER — OXYCODONE HCL 5 MG PO TABS
ORAL_TABLET | ORAL | Status: AC
  Administered 2023-09-19: 5 mg via ORAL
  Filled 2023-09-19: qty 1

## 2023-09-19 MED ORDER — GENTAMICIN SULFATE 40 MG/ML IJ SOLN
300.0000 mg | INTRAVENOUS | Status: AC
  Administered 2023-09-19: 300 mg via INTRAVENOUS
  Filled 2023-09-19: qty 7.5

## 2023-09-19 MED ORDER — FENTANYL CITRATE PF 50 MCG/ML IJ SOSY: 25.0000 ug | PREFILLED_SYRINGE | INTRAMUSCULAR | Status: DC | PRN

## 2023-09-19 MED ORDER — LIDOCAINE HCL (PF) 2 % IJ SOLN
INTRAMUSCULAR | Status: AC
  Filled 2023-09-19: qty 5

## 2023-09-19 MED ORDER — PHENYLEPHRINE 80 MCG/ML (10ML) SYRINGE FOR IV PUSH (FOR BLOOD PRESSURE SUPPORT)
PREFILLED_SYRINGE | INTRAVENOUS | Status: DC | PRN
  Administered 2023-09-19: 160 ug via INTRAVENOUS

## 2023-09-19 MED ORDER — LIDOCAINE HCL (CARDIAC) PF 100 MG/5ML IV SOSY
PREFILLED_SYRINGE | INTRAVENOUS | Status: DC | PRN
  Administered 2023-09-19: 100 mg via INTRATRACHEAL

## 2023-09-19 MED ORDER — PROPOFOL 10 MG/ML IV BOLUS
INTRAVENOUS | Status: DC | PRN
Start: 2023-09-19 — End: 2023-09-19
  Administered 2023-09-19: 110 mg via INTRAVENOUS

## 2023-09-19 MED ORDER — ONDANSETRON HCL 4 MG/2ML IJ SOLN
INTRAMUSCULAR | Status: DC | PRN
  Administered 2023-09-19: 4 mg via INTRAVENOUS

## 2023-09-19 MED ORDER — FENTANYL CITRATE (PF) 100 MCG/2ML IJ SOLN
INTRAMUSCULAR | Status: AC
  Filled 2023-09-19: qty 2

## 2023-09-19 MED ORDER — FENTANYL CITRATE (PF) 100 MCG/2ML IJ SOLN
INTRAMUSCULAR | Status: DC | PRN
  Administered 2023-09-19: 25 ug via INTRAVENOUS

## 2023-09-19 MED ORDER — DEXAMETHASONE SODIUM PHOSPHATE 10 MG/ML IJ SOLN
INTRAMUSCULAR | Status: DC | PRN
  Administered 2023-09-19: 10 mg via INTRAVENOUS

## 2023-09-19 MED ORDER — ACETAMINOPHEN 500 MG PO TABS
1000.0000 mg | ORAL_TABLET | Freq: Once | ORAL | Status: AC
  Administered 2023-09-19: 1000 mg via ORAL
  Filled 2023-09-19: qty 2

## 2023-09-19 MED ORDER — PROPOFOL 10 MG/ML IV BOLUS
INTRAVENOUS | Status: AC
  Filled 2023-09-19: qty 20

## 2023-09-19 MED ORDER — ONDANSETRON HCL 4 MG/2ML IJ SOLN: 4.0000 mg | Freq: Once | INTRAMUSCULAR | Status: AC | PRN

## 2023-09-19 MED ORDER — ONDANSETRON HCL 4 MG/2ML IJ SOLN
INTRAMUSCULAR | Status: AC
  Administered 2023-09-19: 4 mg via INTRAVENOUS
  Filled 2023-09-19: qty 2

## 2023-09-19 MED ORDER — DEXAMETHASONE SODIUM PHOSPHATE 10 MG/ML IJ SOLN
INTRAMUSCULAR | Status: AC
  Filled 2023-09-19: qty 1

## 2023-09-19 MED ORDER — LACTATED RINGERS IV SOLN
INTRAVENOUS | Status: DC
Start: 2023-09-19 — End: 2023-09-19

## 2023-09-19 MED ORDER — IOHEXOL 300 MG/ML  SOLN
INTRAMUSCULAR | Status: DC | PRN
  Administered 2023-09-19: 7 mL via URETHRAL

## 2023-09-19 SURGICAL SUPPLY — 19 items
BAG URO CATCHER STRL LF (MISCELLANEOUS) ×1 IMPLANT
BASKET LASER NITINOL 1.9FR (BASKET) IMPLANT
CATH URETL OPEN END 6FR 70 (CATHETERS) ×1 IMPLANT
CLOTH BEACON ORANGE TIMEOUT ST (SAFETY) ×1 IMPLANT
EXTRACTOR STONE 1.7FRX115CM (UROLOGICAL SUPPLIES) IMPLANT
GLOVE SURG LX STRL 7.5 STRW (GLOVE) ×1 IMPLANT
GOWN STRL REUS W/ TWL XL LVL3 (GOWN DISPOSABLE) ×1 IMPLANT
GUIDEWIRE ANG ZIPWIRE 038X150 (WIRE) ×1 IMPLANT
GUIDEWIRE STR DUAL SENSOR (WIRE) ×1 IMPLANT
KIT TURNOVER KIT A (KITS) IMPLANT
LASER FIB FLEXIVA PULSE ID 365 (Laser) IMPLANT
MANIFOLD NEPTUNE II (INSTRUMENTS) ×1 IMPLANT
PACK CYSTO (CUSTOM PROCEDURE TRAY) ×1 IMPLANT
SHEATH NAVIGATOR HD 11/13X28 (SHEATH) IMPLANT
SHEATH NAVIGATOR HD 11/13X36 (SHEATH) IMPLANT
TRACTIP FLEXIVA PULS ID 200XHI (Laser) IMPLANT
TUBE PU 8FR 16IN ENFIT (TUBING) ×1 IMPLANT
TUBING CONNECTING 10 (TUBING) ×1 IMPLANT
TUBING UROLOGY SET (TUBING) ×1 IMPLANT

## 2023-09-19 NOTE — H&P (Signed)
 [name] is an 89 y.o. female.    Chief Complaint: Pre-OP RIGHT Ureteroscopic Stone Manipulatoin  HPI:   1 - Neurogenic Bladder / Urinary Retention - manages with timed void + (rare) CIC by husband for hyposensitive, obstructed bladder (prior pubovaginal sling) by UDS 2013. Stopped arodn 2020 and has done fine with spontaneous voiding since    Recent Summarized Surveillance:  12/2019 - Renal US  / KUB / BMP - no hydro, some progressio nof LLP cyst size (5.3cm), Cr 0.6  12/2020 - Renal US  / KUB - no hydro,some progression of LLP cyst (6.4cm) stable bilat cyts and non-obstructing stone.  01/2022 - Renal US  / KUB - no hydro, stable LLP cyst and bilat non-obstructing stones, Cr 0.89 ==> STOP annual GU surveillance.  08/2023 - CT - Rt 7mm UPJ sotne and scattered bilateral renal stones, mild Rt hydro. PVR 300 mL (acceptable).    2 - Pelvic Organ Prolapse - s/p TAH/cystocele repair in 1970s, redo anterior repair and autologous sling 2008 by Doyne Genin. Now uses pessary for residual vault prolapse managed by Dee Farber MD.    3 - Non-Complex Renal Cysts - Contrast MRI 2013 with L>R non-complex cysts, dominant 3.7 left lower pole.    PMH sig for spinal stenosis / myelopathy / ataxia, colon resection, ortho surgeries.    Today [name] is seen fto proceed with RIGHT ureteroscopy for UPJ stone. No interval fevers. Most recent UA without infectious parameters. Cr 0.79, CBC normal.    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 (HCC) 03/2011   chronic sinusitis 08/08/2012   Colon polyp    Constipation    COPD (chronic obstructive pulmonary disease) (HCC)    Cystocele 09/16/2012   GERD (gastroesophageal reflux disease)    History of kidney stones    Hyperlipidemia     Hypertension    Hypothyroid    Neurogenic bladder 2013   husband caths pt, manages with timed void   Osteoporosis    Pulmonary embolism (HCC)    1980s   Raynaud phenomenon    Renal cyst    Non-complex, contrast MRI 2013 with L>R non-complex cysts, dominant 3.7 left lower pole   Seizures (HCC)    1990 last seizures on meds Phenobarb   Shingles late 90's   Skin cancer    squamous cell   Thyroid  nodule    Right thyroid  lobe, only seen on Sagittal imaging measures 2.3 cm in craniocaudal dimension and appears stable   Tubular adenoma    Vitamin D  deficiency     Past Surgical History:  Procedure Laterality Date   ABDOMINAL HYSTERECTOMY     ANTERIOR FUSION CLIVUS-C2 EXTRAORAL W/ ODONTOID EXCISION  12/2012   APPENDECTOMY     ARTHROSCOPIC REPAIR ACL Left 1976   BASAL CELL CARCINOMA EXCISION     face   BLADDER SUSPENSION     BREAST DUCTAL SYSTEM EXCISION Right 01/15/2014   Procedure: EXCISION DUCTAL SYSTEM RIGHT BREAST;  Surgeon: Lockie Rima, MD;  Location: [facility];  Service: General;  Laterality: Right;   BREAST EXCISIONAL BIOPSY Right    CARPAL TUNNEL RELEASE Bilateral    B, mult times   CATARACT EXTRACTION     bilateral  cervical neck ablation     x 7, C3-C6/3 screws and plate   CESAREAN SECTION     CYSTOCELE REPAIR     DILATION AND CURETTAGE OF UTERUS     duptyren's contracture right hand     FINGER ARTHRODESIS Right 05/06/2015   Procedure: RIGHT RING PROXIMAL INTERPHALANGEAL FUSION (PIP);  Surgeon: Brunilda Capra, MD;  Location: [facility];  Service: Orthopedics;  Laterality: Right;   FINGER GANGLION CYST EXCISION     right   HEMICOLECTOMY Right    JOINT REPLACEMENT     right and left basal joints of thumbs   left finger fusion     3 fingers on left hand/one finger right   left ovary and tube removed     NASAL POLYP SURGERY     4 sinus surgeries   NASAL SEPTUM SURGERY     PANNICULECTOMY     PROXIMAL INTERPHALANGEAL FUSION (PIP) Left  09/09/2013   Procedure: FUSION LEFT INDEX PROXIMAL INTERPHALANGEAL JOINT (PIP);  Surgeon: Amelie Baize., MD;  Location: [facility];  Service: Orthopedics;  Laterality: Left;   right median nerve decompression     SHOULDER ARTHROSCOPY     x2 left, 1 right   sinus surgeries     x 4   squamous lesions removed     neck and face   STERIOD INJECTION Right 05/06/2015   Procedure: STEROID INJECTION;  Surgeon: Brunilda Capra, MD;  Location: [facility];  Service: Orthopedics;  Laterality: Right;  Right Index Finger Proximal InterPhalangeal Joint Injection   TONSILLECTOMY AND ADENOIDECTOMY     TUBAL LIGATION     vocal polyps removed      Family History  Problem Relation Age of Onset   Heart disease Mother    Hyperlipidemia Mother        M, aunt   Colon cancer Father        84   Ovarian cancer Sister 35 - 82   Lung cancer Sister        lung - stage 1   Thyroid  cancer Sister    Skin cancer Daughter    Malignant hyperthermia Cousin    Stomach cancer Other        first cousin   Breast cancer Neg Hx    Esophageal cancer Neg Hx    Pancreatic cancer Neg Hx    Social History:  reports that she quit smoking about 45 years ago. Her smoking use included cigarettes. She started smoking about 48 years ago. She has a 1.5 pack-year smoking history. She has never used smokeless tobacco. She reports that she does not drink alcohol  and does not use drugs.  Allergies:  Allergies  Allergen Reactions   Iodinated Contrast Media Anaphylaxis    Cardiac arrest   Iodine Other (See Comments)    Cardiac arrest As young adult, received iodinated contrast for IVP, went into cardiac arrest, was resuscitated and hospitalized. Has not received iodinated contrast since that episode. Reports no adverse reaction to betadine.    Metrizamide Other (See Comments)    Cardiac arrest     Morphine Other (See Comments)    Cardiac arrest Patient has previously tolerated hydrocodone,  hydromorphone  and fentanyl     Morphine And Codeine Other (See Comments)    Cardiac arrest   Cocklebur    Lambs Quarters    Mugwort    Sheep Sorrel    Spiny Pigweed (Amaranthus Spinosus) Skin Test     No  medications prior to admission.    No results found. However, due to the size of the patient record, not all encounters were searched. Please check Results Review for a complete set of results. No results found.  Review of Systems  Constitutional:  Negative for chills and fever.  Genitourinary:  Positive for flank pain.  All other systems reviewed and are negative.   There were no vitals taken for this visit. Physical Exam Vitals reviewed.  HENT:     Head: Normocephalic.  Eyes:     Pupils: Pupils are equal, round, and reactive to light.  Cardiovascular:     Rate and Rhythm: Normal rate.  Pulmonary:     Effort: Pulmonary effort is normal.  Abdominal:     General: Abdomen is flat.  Genitourinary:    Comments: Minimal Rt CVAT Musculoskeletal:        General: Normal range of motion.     Cervical back: Normal range of motion.  Skin:    General: Skin is warm.  Neurological:     General: No focal deficit present.  Psychiatric:        Mood and Affect: Mood normal.      Assessment/Plan  Proceed as planned with RIGHT ureteroscopic stone manipulation. Risks, benefits, alternatives, expected peri-op course discussed previously and reiterated today.   Melody Spurling., MD 09/19/2023, 6:46 AM

## 2023-09-19 NOTE — Anesthesia Procedure Notes (Signed)
 Procedure Name: LMA Insertion Date/Time: 09/19/2023 10:35 AM  Performed by: [place], Avi Lemma, CRNAPre-anesthesia Checklist: Patient identified, Emergency Drugs available, Suction available, Patient being monitored and Timeout performed Patient Re-evaluated:Patient Re-evaluated prior to induction Oxygen Delivery Method: Circle system utilized Preoxygenation: Pre-oxygenation with 100% oxygen Induction Type: IV induction LMA: LMA inserted LMA Size: 4.0 Tube secured with: Tape Dental Injury: Teeth and Oropharynx as per pre-operative assessment

## 2023-09-19 NOTE — Brief Op Note (Signed)
 09/19/2023  11:02 AM  PATIENT:  [name]  89 y.o. female  PRE-OPERATIVE DIAGNOSIS:  RIGHT URETERAL STONE  POST-OPERATIVE DIAGNOSIS:  RIGHT URETERAL STONE  PROCEDURE:  Procedure(s): CYSTOSCOPY/URETEROSCOPY/BASKETING OF STONES (Right)  SURGEON:  Surgeons and Role:    * Manny, Harvey Linen., MD - Primary  PHYSICIAN ASSISTANT:   ASSISTANTS: none   ANESTHESIA:   general  EBL:  minimal   BLOOD ADMINISTERED:none  DRAINS: none   LOCAL MEDICATIONS USED:  NONE  SPECIMEN:  Source of Specimen:  punctate Rt renal stone fragments  DISPOSITION OF SPECIMEN:   discard  COUNTS:  YES  TOURNIQUET:  * No tourniquets in log *  DICTATION: .Other Dictation: Dictation Number [id number]  PLAN OF CARE: Discharge to home after PACU  PATIENT DISPOSITION:  PACU - hemodynamically stable.   Delay start of Pharmacological VTE agent (>24hrs) due to surgical blood loss or risk of bleeding: not applicable

## 2023-09-19 NOTE — Transfer of Care (Signed)
 Immediate Anesthesia Transfer of Care Note  Patient: Evee Liska  Procedure(s) Performed: CYSTOSCOPY/URETEROSCOPY/BASKETING OF STONES (Right)  Patient Location: PACU  Anesthesia Type:General  Level of Consciousness: sedated  Airway & Oxygen Therapy: Patient Spontanous Breathing and Patient connected to face mask oxygen  Post-op Assessment: Report given to RN and Post -op Vital signs reviewed and stable  Post vital signs: Reviewed and stable  Last Vitals:  Vitals Value Taken Time  BP    Temp    Pulse 68 09/19/23 1108  Resp    SpO2 100 % 09/19/23 1108  Vitals shown include unfiled device data.  Last Pain:  Vitals:   09/19/23 0943  TempSrc:   PainSc: 0-No pain         Complications: No notable events documented.

## 2023-09-19 NOTE — Anesthesia Postprocedure Evaluation (Signed)
 Anesthesia Post Note  Patient: [name]  Procedure(s) Performed: CYSTOSCOPY/URETEROSCOPY/BASKETING OF STONES (Right)     Patient location during evaluation: PACU Anesthesia Type: General Level of consciousness: awake and alert Pain management: pain level controlled Vital Signs Assessment: post-procedure vital signs reviewed and stable Respiratory status: spontaneous breathing, nonlabored ventilation and respiratory function stable Cardiovascular status: stable and blood pressure returned to baseline Anesthetic complications: no  No notable events documented.  Last Vitals:  Vitals:   09/19/23 1145 09/19/23 1206  BP: (!) 151/71 (!) (P) 153/69  Pulse: 71 (P) 71  Resp: 15 (P) 15  Temp:  (P) 36.4 C  SpO2: 98% (P) 97%    Last Pain:  Vitals:   09/19/23 1145  TempSrc:   PainSc: 0-No pain                 Juventino Oppenheim

## 2023-09-19 NOTE — Discharge Instructions (Addendum)
 1 - You may have urinary urgency (bladder spasms) and bloody urine for up to 2 weeks. This is normal.  2 - Call MD or go to ER for fever >102, severe pain / nausea / vomiting not relieved by medications, or acute change in medical status

## 2023-09-20 ENCOUNTER — Encounter (HOSPITAL_COMMUNITY): Payer: Self-pay | Admitting: Urology

## 2023-09-20 ENCOUNTER — Ambulatory Visit: Admitting: Hematology & Oncology

## 2023-09-20 ENCOUNTER — Inpatient Hospital Stay

## 2023-09-20 NOTE — Op Note (Unsigned)
 NAME: Julia, [name] MEDICAL RECORD NO: [id number] ACCOUNT NO: [id number] DATE OF BIRTH: [birth year] FACILITY: [facility] LOCATION: [facility] PHYSICIAN: Osborn Blaze, MD  Operative Report   DATE OF PROCEDURE: 09/19/2023  PREOPERATIVE DIAGNOSIS:  Right ureteral stone.  POSTOPERATIVE DIAGNOSES:  Interval passage of right ureteral stone, right renal stone.  PROCEDURES PERFORMED: 1.  Cystoscopy with right retrograde pyelogram with interpretation. 2.  Right ureteroscopy with basketing of stones.  ESTIMATED BLOOD LOSS:  Nil.  COMPLICATIONS:  None.  SPECIMENS:  Right punctate renal stone fragments, for discard.  FINDINGS: 1.  Suspect interval passage of right proximal ureteral stone.  No intraluminal stone seen in the ureter whatsoever. 2.  Unremarkable right retrograde pyelogram.  No obvious filling defects. 3.  Punctate right lower pole renal papillary tip calcification.  This was amenable to simple basketing.  4.  Complete resolution of all accessible stone fragments larger than one-third mm following basket extraction on the right side. 5.  Nonplacement of right ureteral stent.  INDICATIONS:  The patient is a very pleasant and fairly vigorous 89 year old lady with a longstanding history of neurogenic bladder that she manages with some spontaneous voiding and some self-catheterization in a team fashion with her husband.  She was  found on workup of some flank pain to have a right proximal ureteral stone with mild hydronephrosis, already well known to us  and was referred for this.  Imaging was reviewed.  She was found to have some multifocality of her stones on the right.  Options  were discussed, including medical therapy versus shock wave lithotripsy versus ureteroscopy.  And she agreed upon ureteroscopy with the goal of right-sided stone free.  She presents for this today.  She denies obvious interval stone passage.  Informed  consent was obtained and placed in the medical  record.  DESCRIPTION OF PROCEDURE:  The patient being [name] being verified and the procedure being right ureteroscopy with stone manipulation was confirmed.  Procedure timeout was performed.  Intravenous antibiotics administered. General LMA anesthesia  induced.  The patient was placed into a low lithotomy position.  Sterile field was created prepped and draped the patient's vagina, introitus and proximal thighs using iodine.  Cystourethroscopy was performed using 21-French rigid cystoscope with offset  lens.  Inspection of urinary bladder revealed no diverticula, calcifications, or papillary lesions.  There was mild trabeculation.  The right ureteral orifice was somewhat lateral in appearance but appeared single.  It was cannulated with a 6-French  open-ended catheter, and a right retrograde pyelogram was obtained.   A right retrograde pyelogram demonstrated a single right ureter, single system right kidney.  No obvious filling defects were noted.  There were two small air bubbles, which were not consistent with stone.  A 0.038 ZipWire was advanced to level of the  upper pole and set aside as a safety wire.  An 8-French feeding tube placed in the urinary bladder for pressure release.  Semirigid ureteroscopy was performed of the distal four-fifths of right ureter alongside a separate sensor working wire.  No mucosal  abnormalities were found.  The semi-rigid ureteroscope was then exchanged for a short-length ureteral access sheath to the level of the proximal ureter.  Using continuous fluoroscopic guidance, a flexible digital ureteroscope was performed in the  proximal ureter.  Systematic inspection of the right kidney including all calyces x 3.  Only calcifications noted whatsoever were in the lower pole.  These were papillary tip calcifications, dominant one approximately 3-4 mm.  The larger expected  proximal  ureteral stone was not visualized whatsoever.  This overall favored interval passage.   The lower pole fragments were grasped with escape basket, removed, and set aside for discard and each calyx was once again inspected x 2, and no evidence of  residual urolithiasis was seen whatsoever on the right side.  The access sheath was very carefully removed under continuous vision.  No mucosal abnormalities or calcifications were noted.  No additional orifices suggesting any sort of a bifid or duplex  system were noted either.  An overall evaluation today corroborated most with interval passage of her ureteral stone.  We have now rendered her stone free.  Given the relatively atraumatic nature of the procedure today and her normal contralateral  kidney, it was not felt that interval stenting would be warranted.  The procedure was terminated.  The patient tolerated the procedure well with no immediate periprocedural complications.  The patient was taken to the postanesthesia care unit in stable  condition.  Plan for discharge home.   MUK D: 09/19/2023 11:08:08 am T: 09/20/2023 1:24:00 am  JOB: [id number]/ [id number]

## 2023-09-21 DIAGNOSIS — N201 Calculus of ureter: Secondary | ICD-10-CM | POA: Diagnosis not present

## 2023-09-21 DIAGNOSIS — K449 Diaphragmatic hernia without obstruction or gangrene: Secondary | ICD-10-CM | POA: Diagnosis not present

## 2023-09-21 DIAGNOSIS — N202 Calculus of kidney with calculus of ureter: Secondary | ICD-10-CM | POA: Diagnosis not present

## 2023-09-21 DIAGNOSIS — N200 Calculus of kidney: Secondary | ICD-10-CM | POA: Diagnosis not present

## 2023-09-21 DIAGNOSIS — N319 Neuromuscular dysfunction of bladder, unspecified: Secondary | ICD-10-CM | POA: Diagnosis not present

## 2023-09-21 DIAGNOSIS — N281 Cyst of kidney, acquired: Secondary | ICD-10-CM | POA: Diagnosis not present

## 2023-09-22 ENCOUNTER — Emergency Department (HOSPITAL_COMMUNITY)

## 2023-09-22 ENCOUNTER — Encounter (HOSPITAL_COMMUNITY): Payer: Self-pay

## 2023-09-22 ENCOUNTER — Observation Stay (HOSPITAL_COMMUNITY)
Admission: EM | Admit: 2023-09-22 | Discharge: 2023-09-24 | Disposition: A | Discharge status: Home / Self Care | Attending: Family Medicine | Admitting: Family Medicine

## 2023-09-22 ENCOUNTER — Other Ambulatory Visit: Payer: Self-pay

## 2023-09-22 DIAGNOSIS — Z85828 Personal history of other malignant neoplasm of skin: Secondary | ICD-10-CM | POA: Diagnosis not present

## 2023-09-22 DIAGNOSIS — I10 Essential (primary) hypertension: Secondary | ICD-10-CM | POA: Diagnosis not present

## 2023-09-22 DIAGNOSIS — N281 Cyst of kidney, acquired: Secondary | ICD-10-CM | POA: Diagnosis not present

## 2023-09-22 DIAGNOSIS — J449 Chronic obstructive pulmonary disease, unspecified: Secondary | ICD-10-CM | POA: Diagnosis not present

## 2023-09-22 DIAGNOSIS — R339 Retention of urine, unspecified: Secondary | ICD-10-CM | POA: Diagnosis not present

## 2023-09-22 DIAGNOSIS — K219 Gastro-esophageal reflux disease without esophagitis: Secondary | ICD-10-CM | POA: Diagnosis not present

## 2023-09-22 DIAGNOSIS — R109 Unspecified abdominal pain: Secondary | ICD-10-CM | POA: Diagnosis present

## 2023-09-22 DIAGNOSIS — Z9049 Acquired absence of other specified parts of digestive tract: Secondary | ICD-10-CM | POA: Diagnosis not present

## 2023-09-22 DIAGNOSIS — K5901 Slow transit constipation: Secondary | ICD-10-CM | POA: Diagnosis not present

## 2023-09-22 DIAGNOSIS — Z79899 Other long term (current) drug therapy: Secondary | ICD-10-CM | POA: Insufficient documentation

## 2023-09-22 DIAGNOSIS — Z96693 Finger-joint replacement, bilateral: Secondary | ICD-10-CM | POA: Insufficient documentation

## 2023-09-22 DIAGNOSIS — K567 Ileus, unspecified: Secondary | ICD-10-CM | POA: Diagnosis not present

## 2023-09-22 DIAGNOSIS — N200 Calculus of kidney: Secondary | ICD-10-CM | POA: Diagnosis not present

## 2023-09-22 DIAGNOSIS — Z86711 Personal history of pulmonary embolism: Secondary | ICD-10-CM | POA: Diagnosis not present

## 2023-09-22 DIAGNOSIS — G6181 Chronic inflammatory demyelinating polyneuritis: Secondary | ICD-10-CM | POA: Insufficient documentation

## 2023-09-22 DIAGNOSIS — Z87891 Personal history of nicotine dependence: Secondary | ICD-10-CM | POA: Insufficient documentation

## 2023-09-22 DIAGNOSIS — E039 Hypothyroidism, unspecified: Secondary | ICD-10-CM | POA: Insufficient documentation

## 2023-09-22 DIAGNOSIS — K5909 Other constipation: Principal | ICD-10-CM | POA: Insufficient documentation

## 2023-09-22 DIAGNOSIS — K449 Diaphragmatic hernia without obstruction or gangrene: Secondary | ICD-10-CM | POA: Diagnosis not present

## 2023-09-22 DIAGNOSIS — K5900 Constipation, unspecified: Secondary | ICD-10-CM | POA: Diagnosis present

## 2023-09-22 LAB — URINALYSIS, ROUTINE W REFLEX MICROSCOPIC
Bilirubin Urine: NEGATIVE
Glucose, UA: NEGATIVE mg/dL
Ketones, ur: 20 mg/dL — AB
Nitrite: NEGATIVE
Protein, ur: NEGATIVE mg/dL
Specific Gravity, Urine: 1.003 — ABNORMAL LOW (ref 1.005–1.030)
pH: 7 (ref 5.0–8.0)

## 2023-09-22 LAB — CBC WITH DIFFERENTIAL/PLATELET
Abs Immature Granulocytes: 0.03 10*3/uL (ref 0.00–0.07)
Basophils Absolute: 0 10*3/uL (ref 0.0–0.1)
Basophils Relative: 0 %
Eosinophils Absolute: 0 10*3/uL (ref 0.0–0.5)
Eosinophils Relative: 0 %
HCT: 44.6 % (ref 36.0–46.0)
Hemoglobin: 15.1 g/dL — ABNORMAL HIGH (ref 12.0–15.0)
Immature Granulocytes: 0 %
Lymphocytes Relative: 10 %
Lymphs Abs: 0.9 10*3/uL (ref 0.7–4.0)
MCH: 31.5 pg (ref 26.0–34.0)
MCHC: 33.9 g/dL (ref 30.0–36.0)
MCV: 93.1 fL (ref 80.0–100.0)
Monocytes Absolute: 0.8 10*3/uL (ref 0.1–1.0)
Monocytes Relative: 9 %
Neutro Abs: 7.2 10*3/uL (ref 1.7–7.7)
Neutrophils Relative %: 81 %
Platelets: 192 10*3/uL (ref 150–400)
RBC: 4.79 MIL/uL (ref 3.87–5.11)
RDW: 13.1 % (ref 11.5–15.5)
WBC: 9.1 10*3/uL (ref 4.0–10.5)
nRBC: 0 % (ref 0.0–0.2)

## 2023-09-22 LAB — COMPREHENSIVE METABOLIC PANEL WITH GFR
ALT: 24 U/L (ref 0–44)
AST: 31 U/L (ref 15–41)
Albumin: 3.8 g/dL (ref 3.5–5.0)
Alkaline Phosphatase: 74 U/L (ref 38–126)
Anion gap: 8 (ref 5–15)
BUN: 28 mg/dL — ABNORMAL HIGH (ref 8–23)
CO2: 26 mmol/L (ref 22–32)
Calcium: 8.5 mg/dL — ABNORMAL LOW (ref 8.9–10.3)
Chloride: 99 mmol/L (ref 98–111)
Creatinine, Ser: 1.07 mg/dL — ABNORMAL HIGH (ref 0.44–1.00)
GFR, Estimated: 50 mL/min — ABNORMAL LOW (ref >60)
Glucose, Bld: 104 mg/dL — ABNORMAL HIGH (ref 70–99)
Potassium: 4.9 mmol/L (ref 3.5–5.1)
Sodium: 133 mmol/L — ABNORMAL LOW (ref 135–145)
Total Bilirubin: 0.8 mg/dL (ref 0.0–1.2)
Total Protein: 7.4 g/dL (ref 6.5–8.1)

## 2023-09-22 LAB — MAGNESIUM: Magnesium: 3 mg/dL — ABNORMAL HIGH (ref 1.7–2.4)

## 2023-09-22 LAB — LIPASE, BLOOD: Lipase: 32 U/L (ref 11–51)

## 2023-09-22 MED ORDER — CARBAMAZEPINE ER 200 MG PO TB12
200.0000 mg | ORAL_TABLET | Freq: Every day | ORAL | Status: DC
  Administered 2023-09-22 – 2023-09-23 (×2): 200 mg via ORAL
  Filled 2023-09-22 (×2): qty 1

## 2023-09-22 MED ORDER — GABAPENTIN 100 MG PO CAPS
300.0000 mg | ORAL_CAPSULE | Freq: Every day | ORAL | Status: DC
  Administered 2023-09-22 – 2023-09-23 (×2): 300 mg via ORAL
  Filled 2023-09-22 (×2): qty 3

## 2023-09-22 MED ORDER — SIMETHICONE 80 MG PO CHEW
80.0000 mg | CHEWABLE_TABLET | Freq: Four times a day (QID) | ORAL | Status: DC | PRN
  Administered 2023-09-23 – 2023-09-24 (×3): 80 mg via ORAL
  Filled 2023-09-22 (×3): qty 1

## 2023-09-22 MED ORDER — ARFORMOTEROL TARTRATE 15 MCG/2ML IN NEBU: 15.0000 ug | INHALATION_SOLUTION | RESPIRATORY_TRACT | Status: DC

## 2023-09-22 MED ORDER — ONDANSETRON HCL 4 MG/2ML IJ SOLN: 4.0000 mg | Freq: Four times a day (QID) | INTRAMUSCULAR | Status: DC | PRN

## 2023-09-22 MED ORDER — LEVOTHYROXINE SODIUM 50 MCG PO TABS
50.0000 ug | ORAL_TABLET | Freq: Every day | ORAL | Status: DC
  Administered 2023-09-23 – 2023-09-24 (×2): 50 ug via ORAL
  Filled 2023-09-22 (×2): qty 1

## 2023-09-22 MED ORDER — POLYETHYLENE GLYCOL 3350 17 G PO PACK
17.0000 g | PACK | Freq: Two times a day (BID) | ORAL | Status: DC | PRN
  Administered 2023-09-23 – 2023-09-24 (×3): 17 g via ORAL
  Filled 2023-09-22 (×3): qty 1

## 2023-09-22 MED ORDER — ARFORMOTEROL TARTRATE 15 MCG/2ML IN NEBU
15.0000 ug | INHALATION_SOLUTION | Freq: Two times a day (BID) | RESPIRATORY_TRACT | Status: DC
  Administered 2023-09-22 – 2023-09-24 (×4): 15 ug via RESPIRATORY_TRACT
  Filled 2023-09-22 (×4): qty 2

## 2023-09-22 MED ORDER — SORBITOL 70 % SOLN
30.0000 mL | Freq: Every day | Status: DC | PRN
  Administered 2023-09-23 (×2): 30 mL via ORAL
  Filled 2023-09-22 (×4): qty 30

## 2023-09-22 MED ORDER — LEVOTHYROXINE SODIUM 25 MCG PO TABS: 25.0000 ug | ORAL_TABLET | Freq: Every day | ORAL | Status: DC

## 2023-09-22 MED ORDER — ONDANSETRON HCL 4 MG/2ML IJ SOLN
4.0000 mg | Freq: Once | INTRAMUSCULAR | Status: AC
  Administered 2023-09-22: 4 mg via INTRAVENOUS
  Filled 2023-09-22: qty 2

## 2023-09-22 MED ORDER — ENOXAPARIN SODIUM 40 MG/0.4ML IJ SOSY
40.0000 mg | PREFILLED_SYRINGE | INTRAMUSCULAR | Status: DC
  Administered 2023-09-22 – 2023-09-23 (×2): 40 mg via SUBCUTANEOUS
  Filled 2023-09-22 (×2): qty 0.4

## 2023-09-22 MED ORDER — MONTELUKAST SODIUM 10 MG PO TABS
10.0000 mg | ORAL_TABLET | Freq: Every day | ORAL | Status: DC
  Administered 2023-09-22 – 2023-09-23 (×2): 10 mg via ORAL
  Filled 2023-09-22 (×2): qty 1

## 2023-09-22 MED ORDER — BUDESONIDE 0.25 MG/2ML IN SUSP
0.2500 mg | Freq: Two times a day (BID) | RESPIRATORY_TRACT | Status: DC
  Administered 2023-09-22 – 2023-09-24 (×4): 0.25 mg via RESPIRATORY_TRACT
  Filled 2023-09-22 (×4): qty 2

## 2023-09-22 MED ORDER — BUDESONIDE 0.25 MG/2ML IN SUSP: 0.2500 mg | RESPIRATORY_TRACT | Status: DC

## 2023-09-22 MED ORDER — SODIUM CHLORIDE 0.9 % IV SOLN
INTRAVENOUS | Status: AC
Start: 2023-09-22 — End: 2023-09-23

## 2023-09-22 MED ORDER — DOCUSATE SODIUM 100 MG PO CAPS
100.0000 mg | ORAL_CAPSULE | Freq: Two times a day (BID) | ORAL | Status: DC
  Administered 2023-09-22 – 2023-09-24 (×4): 100 mg via ORAL
  Filled 2023-09-22 (×4): qty 1

## 2023-09-22 MED ORDER — SODIUM CHLORIDE 0.9 % IV BOLUS
1000.0000 mL | Freq: Once | INTRAVENOUS | Status: AC
  Administered 2023-09-22: 1000 mL via INTRAVENOUS

## 2023-09-22 MED ORDER — PANTOPRAZOLE SODIUM 40 MG IV SOLR
40.0000 mg | INTRAVENOUS | Status: DC
  Administered 2023-09-22 – 2023-09-23 (×2): 40 mg via INTRAVENOUS
  Filled 2023-09-22 (×2): qty 10

## 2023-09-22 MED ORDER — AMLODIPINE BESYLATE 10 MG PO TABS
10.0000 mg | ORAL_TABLET | Freq: Every day | ORAL | Status: DC
  Administered 2023-09-23 – 2023-09-24 (×2): 10 mg via ORAL
  Filled 2023-09-22 (×2): qty 1

## 2023-09-22 MED ORDER — ACETAMINOPHEN 650 MG RE SUPP: 650.0000 mg | Freq: Four times a day (QID) | RECTAL | Status: DC | PRN

## 2023-09-22 MED ORDER — ALBUTEROL SULFATE (2.5 MG/3ML) 0.083% IN NEBU: 2.5000 mg | INHALATION_SOLUTION | RESPIRATORY_TRACT | Status: DC | PRN

## 2023-09-22 MED ORDER — ACETAMINOPHEN 325 MG PO TABS
650.0000 mg | ORAL_TABLET | Freq: Four times a day (QID) | ORAL | Status: DC | PRN
  Administered 2023-09-23: 650 mg via ORAL
  Filled 2023-09-22 (×2): qty 2

## 2023-09-22 MED ORDER — FLUTICASONE PROPIONATE 50 MCG/ACT NA SUSP
1.0000 | Freq: Every day | NASAL | Status: DC
  Filled 2023-09-22: qty 16

## 2023-09-22 MED ORDER — ONDANSETRON HCL 4 MG PO TABS: 4.0000 mg | ORAL_TABLET | Freq: Four times a day (QID) | ORAL | Status: DC | PRN

## 2023-09-22 NOTE — ED Triage Notes (Signed)
 Pt. Comes from home with husband due to abdominal pain. Husband states she had screening done at [facility] and they stated that she has a bowl obstruction. Pt has a urine cathter.  Pt. States on Wednesday she had a kidney stone removed at [facility].   BP 150/66 HR 93 O2 94

## 2023-09-22 NOTE — ED Provider Notes (Signed)
 [place] EMERGENCY DEPARTMENT AT [facility] Provider Note   CSN: [id number] Arrival date & time: 09/22/23  0840     History  Chief Complaint  Patient presents with   Abdominal Pain    Bowl obstruction     [name] is a 89 y.o. female with history of chronic constipation and kidney stones presented to ED with abdominal bloating and nausea.  Patient is accompanied by her husband to provide supplemental history.  She underwent Cystoscopy on 09/19/23 for ureteral stone which was found to be unremarkable at that time.  Subsequently she was discharged with tramadol  at home which she took for postprocedural pain.  She has a history of longstanding constipation and became constipated, and has not had a bowel movement in about 5 days per her recollection.  She is not certain that she is passing gas either.  They went back to the urologist because she was having difficulty urinating yesterday and they placed a Foley catheter and also had a CT scan performed of the abdomen pelvis.  I was able to review the scan on external records and it showed no evidence of continued stone passage but did note the patient had developed an ileus with moderate colonic stool burden.  She has nauseated but not vomiting  The patient's husband given the patient 1 bottle magnesium  citrate as well as 3 doses of oral MiraLAX , and I did attempt an enema as well, they have not been able to produce a bowel movement.  No stool in the rectal vault.  HPI     Home Medications Prior to Admission medications   Medication Sig Start Date End Date Taking? Authorizing Provider  albuterol  (PROVENTIL ) (2.5 MG/3ML) 0.083% nebulizer solution Take 2.5 mg by nebulization every 4 (four) hours as needed for shortness of breath or wheezing. 02/24/19  Yes [provider]  amLODipine  (NORVASC ) 10 MG tablet TAKE 1 TABLET(10 MG) BY MOUTH DAILY 04/01/23  Yes [name], MD  arformoterol  (BROVANA ) 15 MCG/2ML  NEBU Take 15 mcg by nebulization See admin instructions. Inhale two to three times daily. 08/08/21  Yes [provider]  atorvastatin  (LIPITOR) 40 MG tablet TAKE 1 TABLET BY MOUTH EVERY NIGHT AT BEDTIME 02/16/23  Yes [name], MD  budesonide  (PULMICORT ) 0.25 MG/2ML nebulizer solution Take 0.25 mg by nebulization See admin instructions. Inhale to two to three times daily. 07/22/21  Yes [provider]  CALCIUM  PO Take 600 mg by mouth daily.   Yes [provider]  carbamazepine  (TEGRETOL  XR) 100 MG 12 hr tablet Take 200 mg by mouth at bedtime. 01/16/23 01/24/24 Yes [provider]  Cholecalciferol 25 MCG (1000 UT) capsule Take 1,000 Units by mouth daily.   Yes [provider]  Docusate Sodium (COLACE PO) Take 1 tablet by mouth daily.   Yes [provider]  gabapentin (NEURONTIN) 300 MG capsule Take 300 mg by mouth at bedtime. 07/21/19  Yes [provider]  levothyroxine  (SYNTHROID ) 50 MCG tablet TAKE 1 TABLET(50 MCG) BY MOUTH DAILY BEFORE BREAKFAST Patient taking differently: Take 50 mcg by mouth daily. 12/22/22  Yes [name], MD  mometasone  (NASONEX ) 50 MCG/ACT nasal spray Place 2 sprays into the nose in the morning and at bedtime.   Yes [provider]  montelukast  (SINGULAIR ) 10 MG tablet TAKE 1 TABLET(10 MG) BY MOUTH AT BEDTIME 03/22/23  Yes [name], MD  NONFORMULARY OR COMPOUNDED ITEM Take 1 Inhalation by mouth daily. Mometasone /levofloxacin compounded inhalation  Yes [provider]  ondansetron  (ZOFRAN ) 4 MG tablet Take 4 mg by mouth every 6 (six) hours. 09/20/23  Yes [provider]  PHENobarbital  (LUMINAL) 32.4 MG tablet TAKE 1 TABLET BY MOUTH AT 6 AM AND 2 TABLETS BY MOUTH AT 8 PM. 06/11/23  Yes [name], MD  predniSONE  (DELTASONE ) 5 MG tablet Take 10 mg by mouth daily. 01/12/23  Yes [provider]  VENTOLIN  HFA 108 (90 Base) MCG/ACT inhaler Inhale 1-2 puffs into the  lungs every 4 (four) hours as needed for wheezing or shortness of breath. 06/10/19  Yes [provider]  doxycycline  (VIBRA -TABS) 100 MG tablet Take 100 mg by mouth 2 (two) times daily. Patient not taking: Reported on 09/22/2023 09/21/23   [provider]  traMADol  (ULTRAM ) 50 MG tablet Take 1 tablet (50 mg total) by mouth every 6 (six) hours as needed for moderate pain (pain score 4-6) or severe pain (pain score 7-10) (post-operatively). Patient not taking: Reported on 09/22/2023 09/19/23 09/18/24  Melody Spurling., MD      Allergies    Iodinated contrast media, Iodine, Metrizamide, Morphine, Morphine and codeine, Cocklebur, Lambs quarters, Mugwort, Sheep sorrel, Spiny pigweed (amaranthus spinosus) skin test, and Tramadol     Review of Systems   Review of Systems  Physical Exam Updated Vital Signs BP 125/65 (BP Location: Left Arm)   Pulse 83   Temp 99 F (37.2 C) (Oral)   Resp 16   Ht 5\' 2"  (1.575 m)   Wt 60.3 kg   SpO2 97%   BMI 24.33 kg/m  Physical Exam Constitutional:      General: She is not in acute distress. HENT:     Head: Normocephalic and atraumatic.  Eyes:     Conjunctiva/sclera: Conjunctivae normal.     Pupils: Pupils are equal, round, and reactive to light.  Cardiovascular:     Rate and Rhythm: Normal rate and regular rhythm.  Pulmonary:     Effort: Pulmonary effort is normal. No respiratory distress.  Abdominal:     General: There is no distension.     Tenderness: There is generalized abdominal tenderness. There is rebound. There is no guarding.  Skin:    General: Skin is warm and dry.  Neurological:     General: No focal deficit present.     Mental Status: She is alert. Mental status is at baseline.  Psychiatric:        Mood and Affect: Mood normal.        Behavior: Behavior normal.     ED Results / Procedures / Treatments   Labs (all labs ordered are listed, but only abnormal results are displayed) Labs Reviewed  CBC WITH  DIFFERENTIAL/PLATELET - Abnormal; Notable for the following components:      Result Value   Hemoglobin 15.1 (*)    All other components within normal limits  COMPREHENSIVE METABOLIC PANEL WITH GFR - Abnormal; Notable for the following components:   Sodium 133 (*)    Glucose, Bld 104 (*)    BUN 28 (*)    Creatinine, Ser 1.07 (*)    Calcium  8.5 (*)    GFR, Estimated 50 (*)    All other components within normal limits  MAGNESIUM  - Abnormal; Notable for the following components:   Magnesium  3.0 (*)    All other components within normal limits  URINALYSIS, ROUTINE W REFLEX MICROSCOPIC - Abnormal; Notable for the following components:   Color, Urine STRAW (*)    Specific Gravity,  Urine 1.003 (*)    Hgb urine dipstick LARGE (*)    Ketones, ur 20 (*)    Leukocytes,Ua SMALL (*)    Bacteria, UA RARE (*)    All other components within normal limits  LIPASE, BLOOD    EKG None  Radiology CT ABDOMEN PELVIS WO CONTRAST Result Date: 09/22/2023 CLINICAL DATA:  Ileus. Progressive abdominal pain. Increased distension. EXAM: CT ABDOMEN AND PELVIS WITHOUT CONTRAST TECHNIQUE: Multidetector CT imaging of the abdomen and pelvis was performed following the standard protocol without IV contrast. RADIATION DOSE REDUCTION: This exam was performed according to the departmental dose-optimization program which includes automated exposure control, adjustment of the mA and/or kV according to patient size and/or use of iterative reconstruction technique. COMPARISON:  CT of the abdomen and pelvis at [facility] 09/21/2023 FINDINGS: Lower chest: Mild dependent atelectasis is present on the left. Heart size is normal. Coronary artery calcifications are present. Hepatobiliary: No focal liver abnormality is seen. No gallstones, gallbladder wall thickening, or biliary dilatation. Pancreas: Unremarkable. No pancreatic ductal dilatation or surrounding inflammatory changes. Spleen: Splenic calcifications stable.  Six  Adrenals/Urinary Tract: Adrenal glands are normal bilaterally. A cyst at the lower pole of the left kidney demonstrates minimal mural calcifications. It measures 6.3 cm. A nonobstructing 7 mm stone is present at the upper pole of the left kidney. An 8 mm nonobstructing stone is present posteriorly in the right kidney. Additional punctate nonobstructing stone is present the right kidney. The ureters are within normal limits bilaterally. The urinary bladder is collapsed with a Foley catheter in place. Stomach/Bowel: A small hiatal hernia is present. The stomach and duodenum are otherwise within normal limits. Small bowel is unremarkable. Terminal ileum is normal. Partial ascending colectomy is noted. The anastomosis is intact. Moderate stool present throughout the ascending and proximal transverse colon. No perforation is present. The descending and sigmoid colon are normal. Vascular/Lymphatic: Atherosclerotic calcifications are present the aorta and branch vessels. No aneurysm present. No significant adenopathy is present. Marked soft tissue prominence is noted the presacral space. Reproductive: Status post hysterectomy. No adnexal masses. Other: No significant free fluid or free air is present. Musculoskeletal: Degenerative facet changes are most evident at C4-5. Vertebral body heights and alignment are otherwise normal scratched at vertebral body heights and alignment are normal. No acute or healing fractures are present. No focal osseous lesions are present. IMPRESSION: 1. No acute or focal lesion to explain the patient's symptoms. 2. Partial ascending colectomy. The anastomosis is intact. 3. Moderate stool throughout the ascending and proximal transverse colon. 4. Bilateral nonobstructing nephrolithiasis. 5. 6.3 cm cyst at the lower pole of the left kidney demonstrates minimal mural calcifications. This is stable in size. No follow-up imaging recommended. 6. Coronary artery disease. 7. Presacral soft tissue  likely reflects a post inflammatory state. Malignancy is considered unlikely discussed. Electronically Signed   By: Audree Leas M.D.   On: 09/22/2023 12:13    Procedures Procedures    Medications Ordered in ED Medications  amLODipine  (NORVASC ) tablet 10 mg (has no administration in time range)  carbamazepine  (TEGRETOL  XR) 12 hr tablet 200 mg (has no administration in time range)  gabapentin (NEURONTIN) capsule 300 mg (has no administration in time range)  fluticasone  (FLONASE ) 50 MCG/ACT nasal spray 1 spray (has no administration in time range)  montelukast  (SINGULAIR ) tablet 10 mg (has no administration in time range)  enoxaparin  (LOVENOX ) injection 40 mg (has no administration in time range)  acetaminophen  (TYLENOL ) tablet 650 mg (has no administration in  time range)    Or  acetaminophen  (TYLENOL ) suppository 650 mg (has no administration in time range)  docusate sodium (COLACE) capsule 100 mg (has no administration in time range)  polyethylene glycol (MIRALAX  / GLYCOLAX ) packet 17 g (has no administration in time range)  ondansetron  (ZOFRAN ) tablet 4 mg (has no administration in time range)    Or  ondansetron  (ZOFRAN ) injection 4 mg (has no administration in time range)  sorbitol 70 % solution 30 mL (has no administration in time range)  albuterol  (PROVENTIL ) (2.5 MG/3ML) 0.083% nebulizer solution 2.5 mg (has no administration in time range)  simethicone (MYLICON) chewable tablet 80 mg (has no administration in time range)  0.9 %  sodium chloride  infusion (has no administration in time range)  pantoprazole  (PROTONIX ) injection 40 mg (has no administration in time range)  arformoterol  (BROVANA ) nebulizer solution 15 mcg (has no administration in time range)  budesonide  (PULMICORT ) nebulizer solution 0.25 mg (has no administration in time range)  levothyroxine  (SYNTHROID ) tablet 50 mcg (has no administration in time range)  sodium chloride  0.9 % bolus 1,000 mL (0 mLs  Intravenous Stopped 09/22/23 1219)  ondansetron  (ZOFRAN ) injection 4 mg (4 mg Intravenous Given 09/22/23 0955)    ED Course/ Medical Decision Making/ A&P Clinical Course as of 09/22/23 1606  Sat Sep 22, 2023  1040 Iodine allergies (cardiac arrest?) - will need noncontrasted scan [MT]  1330 Plan to admit for ileus [MT]  1355 Admitted to hospitalist [MT]    Clinical Course User Index [MT] Bowie Doiron, Janalyn Me, MD                                 Medical Decision Making Amount and/or Complexity of Data Reviewed Labs: ordered. Radiology: ordered.  Risk Prescription drug management. Decision regarding hospitalization.   This patient presents to the ED with concern for abdominal pain, constipation. This involves an extensive number of treatment options, and is a complaint that carries with it a high risk of complications and morbidity.  The differential diagnosis includes ongoing ileus versus bowel obstruction versus perforation versus other intra-abdominal process  Co-morbidities that complicate the patient evaluation: Recent opioid use at risk of worsening constipation, instrumentation of the abdomen  Additional history obtained from patient's husband at the bedside  External records from outside source obtained and reviewed including CT abdomen pelvis performed yesterday from urology office  I ordered and personally interpreted labs.  The pertinent results include: No emergent findings.  Creatinine near baseline at 1.0.  UA with large hemoglobin, no clear evidence of infection, with small leukocytes.  CBC unremarkable.  I ordered imaging studies including CT abdomen pelvis I independently visualized and interpreted imaging which showed continued constipation pattern, no other emergent findings from prior imaging, no evident developing bowel obstruction I agree with the radiologist interpretation  I ordered medication including IV fluids and Zofran  for hydration  I have reviewed the  patients home medicines and have made adjustments as needed   Disposition:  After consideration of the diagnostic results and the patients response to treatment, I feel that the patient would benefit from medical admission for ileus management.         Final Clinical Impression(s) / ED Diagnoses Final diagnoses:  Ileus [facility])    Rx / DC Orders ED Discharge Orders     None         Timber Marshman, Janalyn Me, MD 09/22/23 1606

## 2023-09-22 NOTE — H&P (Addendum)
 History and Physical  [name] VWU:[id number] DOB: 05-Dec-1932 DOA: 09/22/2023  PCP: [name], MD   Chief Complaint: Abdominal pain, bloating  HPI: [name] is a 89 y.o. female with medical history significant for gastroparesis, chronic inflammatory demyelinating polyneuropathy, GERD, COPD on room air, partial colectomy being admitted for constipation and bloating as well as abdominal discomfort in the setting of constipation.  History is provided by the patient as well as her husband who is at the bedside, she does chronically have some issues with constipation normally takes stool softeners and MiraLAX  on a regular basis and bowels are managed in that way.  On 4/23, she had outpatient cystoscopy with right retrograde pyelogram due to right-sided kidney stone.  It seems that the kidney had passed, no stone was seen.  He also has a history of neurogenic bladder and in the past required intermittent catheterization.  Since the procedure, she has had worsening abdominal pain, bloating and burping.  She has not had a bowel movement since that time.  They tried stool softeners and MiraLAX  at home, as well as an enema and it was unsuccessful.  Her husband has also been trying to catheterize her, got very small amounts of urine.  Here in the emergency department, CT scan was repeated due to severity of her abdominal pain, and is consistent with some moderate constipation as noted below.  She was also noted to be retaining urine, Foley catheter was placed with return of 900 cc of urine.  She denies any fevers or chills, she has some mild nausea, but no vomiting.  Review of Systems: Please see HPI for pertinent positives and negatives. A complete 10 system review of systems are otherwise negative.  Past Medical History:  Diagnosis Date   Allergic rhinitis    Anemia    Arthritis    Asthma -COPD    Blood transfusion 1957   Breast mass    right breast in milk duct, bx neg    Chronic fatigue syndrome    Chronic inflammatory demyelinating polyneuropathy (HCC) 03/2011   chronic sinusitis 08/08/2012   Colon polyp    Constipation    COPD (chronic obstructive pulmonary disease) (HCC)    Cystocele 09/16/2012   GERD (gastroesophageal reflux disease)    History of kidney stones    Hyperlipidemia    Hypertension    Hypothyroid    Neurogenic bladder 2013   husband caths pt, manages with timed void   Osteoporosis    Pulmonary embolism (HCC)    1980s   Raynaud phenomenon    Renal cyst    Non-complex, contrast MRI 2013 with L>R non-complex cysts, dominant 3.7 left lower pole   Seizures (HCC)    1990 last seizures on meds Phenobarb   Shingles late 90's   Skin cancer    squamous cell   Thyroid  nodule    Right thyroid  lobe, only seen on Sagittal imaging measures 2.3 cm in craniocaudal dimension and appears stable   Tubular adenoma    Vitamin D  deficiency    Past Surgical History:  Procedure Laterality Date   ABDOMINAL HYSTERECTOMY     ANTERIOR FUSION CLIVUS-C2 EXTRAORAL W/ ODONTOID EXCISION  12/2012   APPENDECTOMY     ARTHROSCOPIC REPAIR ACL Left 1976   BASAL CELL CARCINOMA EXCISION     face   BLADDER SUSPENSION     BREAST DUCTAL SYSTEM EXCISION Right 01/15/2014   Procedure: EXCISION DUCTAL SYSTEM RIGHT BREAST;  Surgeon: Lockie Rima, MD;  Location:  [facility];  Service: General;  Laterality: Right;   BREAST EXCISIONAL BIOPSY Right    CARPAL TUNNEL RELEASE Bilateral    B, mult times   CATARACT EXTRACTION     bilateral   cervical neck ablation     x 7, C3-C6/3 screws and plate   CESAREAN SECTION     CYSTOCELE REPAIR     CYSTOSCOPY/URETEROSCOPY/HOLMIUM LASER/STENT PLACEMENT Right 09/19/2023   Procedure: CYSTOSCOPY/URETEROSCOPY/BASKETING OF STONES;  Surgeon: Melody Spurling., MD;  Location: [facility];  Service: Urology;  Laterality: Right;   DILATION AND CURETTAGE OF UTERUS     duptyren's contracture right hand     FINGER ARTHRODESIS  Right 05/06/2015   Procedure: RIGHT RING PROXIMAL INTERPHALANGEAL FUSION (PIP);  Surgeon: Brunilda Capra, MD;  Location: [facility];  Service: Orthopedics;  Laterality: Right;   FINGER GANGLION CYST EXCISION     right   HEMICOLECTOMY Right    JOINT REPLACEMENT     right and left basal joints of thumbs   left finger fusion     3 fingers on left hand/one finger right   left ovary and tube removed     NASAL POLYP SURGERY     4 sinus surgeries   NASAL SEPTUM SURGERY     PANNICULECTOMY     PROXIMAL INTERPHALANGEAL FUSION (PIP) Left 09/09/2013   Procedure: FUSION LEFT INDEX PROXIMAL INTERPHALANGEAL JOINT (PIP);  Surgeon: Amelie Baize., MD;  Location: [facility];  Service: Orthopedics;  Laterality: Left;   right median nerve decompression     SHOULDER ARTHROSCOPY     x2 left, 1 right   sinus surgeries     x 4   squamous lesions removed     neck and face   STERIOD INJECTION Right 05/06/2015   Procedure: STEROID INJECTION;  Surgeon: Brunilda Capra, MD;  Location: [facility];  Service: Orthopedics;  Laterality: Right;  Right Index Finger Proximal InterPhalangeal Joint Injection   TONSILLECTOMY AND ADENOIDECTOMY     TUBAL LIGATION     vocal polyps removed     Social History:  reports that she quit smoking about 45 years ago. Her smoking use included cigarettes. She started smoking about 48 years ago. She has a 1.5 pack-year smoking history. She has never used smokeless tobacco. She reports that she does not drink alcohol  and does not use drugs.  Allergies  Allergen Reactions   Iodinated Contrast Media Anaphylaxis    Cardiac arrest   Iodine Other (See Comments)    Cardiac arrest As young adult, received iodinated contrast for IVP, went into cardiac arrest, was resuscitated and hospitalized. Has not received iodinated contrast since that episode. Reports no adverse reaction to betadine.    Metrizamide Other (See Comments)    Cardiac arrest      Morphine Other (See Comments)    Cardiac arrest Patient has previously tolerated hydrocodone, hydromorphone  and fentanyl     Morphine And Codeine Other (See Comments)    Cardiac arrest   Cocklebur    [place]    Mugwort    Sheep Sorrel    Spiny Pigweed (Amaranthus Spinosus) Skin Test    Tramadol  Other (See Comments)    Causing severe constipation     Family History  Problem Relation Age of Onset   Heart disease Mother    Hyperlipidemia Mother        M, aunt   Colon cancer Father        54  Ovarian cancer Sister 79 - 44   Lung cancer Sister        lung - stage 1   Thyroid  cancer Sister    Skin cancer Daughter    Malignant hyperthermia Cousin    Stomach cancer Other        first cousin   Breast cancer Neg Hx    Esophageal cancer Neg Hx    Pancreatic cancer Neg Hx      Prior to Admission medications   Medication Sig Start Date End Date Taking? Authorizing Provider  doxycycline  (VIBRA -TABS) 100 MG tablet Take 100 mg by mouth 2 (two) times daily. 09/21/23  Yes [provider]  ondansetron  (ZOFRAN ) 4 MG tablet Take 4 mg by mouth every 6 (six) hours. 09/20/23  Yes [provider]  albuterol  (PROVENTIL ) (2.5 MG/3ML) 0.083% nebulizer solution Take 2.5 mg by nebulization every 4 (four) hours as needed for shortness of breath or wheezing. 02/24/19   [provider]  amLODipine  (NORVASC ) 10 MG tablet TAKE 1 TABLET(10 MG) BY MOUTH DAILY 04/01/23   [name], MD  ammonium lactate (LAC-HYDRIN) 12 % lotion Apply 1 Application topically as needed for dry skin. Patient not taking: Reported on 09/07/2023 07/10/23   [provider]  arformoterol  (BROVANA ) 15 MCG/2ML NEBU Take 15 mcg by nebulization See admin instructions. Inhale two to three times daily. 08/08/21   [provider]  atorvastatin  (LIPITOR) 40 MG tablet TAKE 1 TABLET BY MOUTH EVERY NIGHT AT BEDTIME 02/16/23   [name], MD  budesonide  (PULMICORT ) 0.25 MG/2ML nebulizer  solution Take 0.25 mg by nebulization See admin instructions. Inhale to two to three times daily. 07/22/21   [provider]  CALCIUM  PO Take 600 mg by mouth daily.    [provider]  carbamazepine  (TEGRETOL  XR) 100 MG 12 hr tablet Take 200 mg by mouth daily. 01/16/23 01/24/24  [provider]  Cholecalciferol 25 MCG (1000 UT) capsule Take 1,000 Units by mouth daily.    [provider]  Cyanocobalamin  (B-12 PO) Take 1 tablet by mouth daily.    [provider]  Docusate Sodium (COLACE PO) Take 1 tablet by mouth daily.    [provider]  gabapentin (NEURONTIN) 300 MG capsule Take 300 mg by mouth at bedtime. 07/21/19   [provider]  levothyroxine  (SYNTHROID ) 50 MCG tablet TAKE 1 TABLET(50 MCG) BY MOUTH DAILY BEFORE BREAKFAST Patient taking differently: Take 25 mcg by mouth daily. 12/22/22   [name], MD  mometasone  (NASONEX ) 50 MCG/ACT nasal spray Place 2 sprays into the nose daily.    [provider]  montelukast  (SINGULAIR ) 10 MG tablet TAKE 1 TABLET(10 MG) BY MOUTH AT BEDTIME 03/22/23   [name], MD  NONFORMULARY OR COMPOUNDED ITEM Take 1 Inhalation by mouth daily. Mometasone /levofloxacin compounded inhalation    [provider]  PHENobarbital  (LUMINAL) 32.4 MG tablet TAKE 1 TABLET BY MOUTH AT 6 AM AND 2 TABLETS BY MOUTH AT 8 PM. 06/11/23   [name], MD  predniSONE  (DELTASONE ) 5 MG tablet Take 10 mg by mouth daily. 01/12/23   [provider]  traMADol  (ULTRAM ) 50 MG tablet Take 1 tablet (50 mg total) by mouth every 6 (six) hours as needed for moderate pain (pain score 4-6) or severe pain (pain score 7-10) (post-operatively). 09/19/23 09/18/24  Melody Spurling., MD  triamcinolone  cream (KENALOG ) 0.1 % Apply 1 Application topically 2 (two) times daily as needed (Dermatits). 07/25/23   [provider]  VENTOLIN  HFA 108 (90 Base) MCG/ACT inhaler Inhale 1-2 puffs into the lungs every  4 (four) hours as needed for wheezing or shortness of breath. 06/10/19   [provider]    Physical Exam: BP 137/72 (BP Location: Right Arm)   Pulse 91   Temp 99 F (37.2 C) (Oral)   Resp 18   Ht 5\' 2"  (1.575 m)   Wt 60.3 kg   SpO2 98%   BMI 24.33 kg/m  General:  Alert, oriented, calm, in no acute distress, her husband and her daughter-in-law are at the bedside Cardiovascular: RRR, no murmurs or rubs, no peripheral edema  Respiratory: clear to auscultation bilaterally, no wheezes, no crackles  Abdomen: Diffusely tender, distended and tympanic.  No bowel tones currently heard. Skin: dry, no rashes  Musculoskeletal: no joint effusions, normal range of motion  Psychiatric: appropriate affect, normal speech  Neurologic: extraocular muscles intact, clear speech, moving all extremities with intact sensorium         Labs on Admission:  Basic Metabolic Panel: Recent Labs  Lab 09/22/23 0954  NA 133*  K 4.9  CL 99  CO2 26  GLUCOSE 104*  BUN 28*  CREATININE 1.07*  CALCIUM  8.5*  MG 3.0*   Liver Function Tests: Recent Labs  Lab 09/22/23 0954  AST 31  ALT 24  ALKPHOS 74  BILITOT 0.8  PROT 7.4  ALBUMIN 3.8   Recent Labs  Lab 09/22/23 0954  LIPASE 32   No results for input(s): "AMMONIA" in the last 168 hours. CBC: Recent Labs  Lab 09/22/23 0954  WBC 9.1  NEUTROABS 7.2  HGB 15.1*  HCT 44.6  MCV 93.1  PLT 192   Cardiac Enzymes: No results for input(s): "CKTOTAL", "CKMB", "CKMBINDEX", "TROPONINI" in the last 168 hours. BNP (last 3 results) No results for input(s): "BNP" in the last 8760 hours.  ProBNP (last 3 results) No results for input(s): "PROBNP" in the last 8760 hours.  CBG: No results for input(s): "GLUCAP" in the last 168 hours.  Radiological Exams on Admission: CT ABDOMEN PELVIS WO CONTRAST Result Date: 09/22/2023 CLINICAL DATA:  Ileus. Progressive abdominal pain. Increased distension. EXAM: CT ABDOMEN AND PELVIS WITHOUT CONTRAST  TECHNIQUE: Multidetector CT imaging of the abdomen and pelvis was performed following the standard protocol without IV contrast. RADIATION DOSE REDUCTION: This exam was performed according to the departmental dose-optimization program which includes automated exposure control, adjustment of the mA and/or kV according to patient size and/or use of iterative reconstruction technique. COMPARISON:  CT of the abdomen and pelvis at [facility] 09/21/2023 FINDINGS: Lower chest: Mild dependent atelectasis is present on the left. Heart size is normal. Coronary artery calcifications are present. Hepatobiliary: No focal liver abnormality is seen. No gallstones, gallbladder wall thickening, or biliary dilatation. Pancreas: Unremarkable. No pancreatic ductal dilatation or surrounding inflammatory changes. Spleen: Splenic calcifications stable.  Six Adrenals/Urinary Tract: Adrenal glands are normal bilaterally. A cyst at the lower pole of the left kidney demonstrates minimal mural calcifications. It measures 6.3 cm. A nonobstructing 7 mm stone is present at the upper pole of the left kidney. An 8 mm nonobstructing stone is present posteriorly in the right kidney. Additional punctate nonobstructing stone is present the right kidney. The ureters are within normal limits bilaterally. The urinary bladder is collapsed with a Foley catheter in place. Stomach/Bowel: A small hiatal hernia is present. The stomach and duodenum are otherwise within normal limits. Small bowel is unremarkable. Terminal ileum is normal. Partial ascending colectomy  is noted. The anastomosis is intact. Moderate stool present throughout the ascending and proximal transverse colon. No perforation is present. The descending and sigmoid colon are normal. Vascular/Lymphatic: Atherosclerotic calcifications are present the aorta and branch vessels. No aneurysm present. No significant adenopathy is present. Marked soft tissue prominence is noted the presacral  space. Reproductive: Status post hysterectomy. No adnexal masses. Other: No significant free fluid or free air is present. Musculoskeletal: Degenerative facet changes are most evident at C4-5. Vertebral body heights and alignment are otherwise normal scratched at vertebral body heights and alignment are normal. No acute or healing fractures are present. No focal osseous lesions are present. IMPRESSION: 1. No acute or focal lesion to explain the patient's symptoms. 2. Partial ascending colectomy. The anastomosis is intact. 3. Moderate stool throughout the ascending and proximal transverse colon. 4. Bilateral nonobstructing nephrolithiasis. 5. 6.3 cm cyst at the lower pole of the left kidney demonstrates minimal mural calcifications. This is stable in size. No follow-up imaging recommended. 6. Coronary artery disease. 7. Presacral soft tissue likely reflects a post inflammatory state. Malignancy is considered unlikely discussed. Electronically Signed   By: Audree Leas M.D.   On: 09/22/2023 12:13   Assessment/Plan [name] is a 89 y.o. female with medical history significant for gastroparesis, chronic inflammatory demyelinating polyneuropathy, GERD, COPD on room air, partial colectomy being admitted for constipation and bloating as well as abdominal discomfort in the setting of constipation.   Constipation-with bloating, abdominal discomfort.  This is a chronic problem for her, she was recently given tramadol  due to kidney stone and this may have exacerbated her chronic constipation.  CT scan done today as above without acute complication from her recent procedure. -Observation admission -Hydrate with gentle IV fluids -Clear liquid diet for now given her abdominal distention, will plan to advance as tolerated -Pain and nausea medication as needed, avoid narcotics -Scheduled stool softeners, with MiraLAX  twice daily as needed, sorbitol solution daily as needed -Simethicone 4 times daily  as needed  Hypertension-amlodipine   Urinary retention-probably also contributing to the patient's discomfort, Foley catheter has been placed with return of greater than 900 cc of urine.  Would likely plan to keep this in until she follows up as an outpatient with urology  GERD-IV PPI  COPD-stable on room air without evidence of exacerbation  Hypothyroidism-Synthroid   CIDP-continue home gabapentin, Tegretol , etc.  DVT prophylaxis: Lovenox      Code Status: Full Code  Consults called: None  Admission status: Observation  Time spent: 48 minutes  Julia Cando Rickey Charm MD [facility] Pager [phone]  If 7PM-7AM, please contact night-coverage [email or web address] Password [email or web address]  09/22/2023, 2:15 PM

## 2023-09-23 DIAGNOSIS — K5901 Slow transit constipation: Secondary | ICD-10-CM | POA: Diagnosis not present

## 2023-09-23 DIAGNOSIS — K5909 Other constipation: Secondary | ICD-10-CM | POA: Diagnosis not present

## 2023-09-23 LAB — CBC
HCT: 38.6 % (ref 36.0–46.0)
Hemoglobin: 12.6 g/dL (ref 12.0–15.0)
MCH: 31.1 pg (ref 26.0–34.0)
MCHC: 32.6 g/dL (ref 30.0–36.0)
MCV: 95.3 fL (ref 80.0–100.0)
Platelets: 184 10*3/uL (ref 150–400)
RBC: 4.05 MIL/uL (ref 3.87–5.11)
RDW: 13.3 % (ref 11.5–15.5)
WBC: 6.8 10*3/uL (ref 4.0–10.5)
nRBC: 0 % (ref 0.0–0.2)

## 2023-09-23 LAB — BASIC METABOLIC PANEL WITH GFR
Anion gap: 7 (ref 5–15)
BUN: 19 mg/dL (ref 8–23)
CO2: 24 mmol/L (ref 22–32)
Calcium: 7.7 mg/dL — ABNORMAL LOW (ref 8.9–10.3)
Chloride: 106 mmol/L (ref 98–111)
Creatinine, Ser: 1.06 mg/dL — ABNORMAL HIGH (ref 0.44–1.00)
GFR, Estimated: 50 mL/min — ABNORMAL LOW (ref >60)
Glucose, Bld: 89 mg/dL (ref 70–99)
Potassium: 4.7 mmol/L (ref 3.5–5.1)
Sodium: 137 mmol/L (ref 135–145)

## 2023-09-23 MED ORDER — PHENOBARBITAL 32.4 MG PO TABS
32.4000 mg | ORAL_TABLET | Freq: Every day | ORAL | Status: DC
  Administered 2023-09-23 – 2023-09-24 (×2): 32.4 mg via ORAL
  Filled 2023-09-23 (×2): qty 1

## 2023-09-23 MED ORDER — ACETAMINOPHEN 650 MG RE SUPP: 650.0000 mg | Freq: Four times a day (QID) | RECTAL | Status: DC | PRN

## 2023-09-23 MED ORDER — LIDOCAINE 5 % EX PTCH: 1.0000 | MEDICATED_PATCH | CUTANEOUS | Status: DC

## 2023-09-23 MED ORDER — LIDOCAINE 5 % EX PTCH
1.0000 | MEDICATED_PATCH | CUTANEOUS | Status: DC
  Administered 2023-09-23: 1 via TRANSDERMAL
  Filled 2023-09-23: qty 1

## 2023-09-23 MED ORDER — ACETAMINOPHEN 500 MG PO TABS
500.0000 mg | ORAL_TABLET | Freq: Four times a day (QID) | ORAL | Status: DC | PRN
  Administered 2023-09-23 – 2023-09-24 (×3): 500 mg via ORAL
  Filled 2023-09-23 (×3): qty 1

## 2023-09-23 MED ORDER — LIDOCAINE 5 % EX PTCH
1.0000 | MEDICATED_PATCH | Freq: Two times a day (BID) | CUTANEOUS | Status: DC
  Administered 2023-09-23 – 2023-09-24 (×2): 1 via TRANSDERMAL
  Filled 2023-09-23 (×3): qty 1

## 2023-09-23 MED ORDER — CHLORHEXIDINE GLUCONATE CLOTH 2 % EX PADS
6.0000 | MEDICATED_PAD | Freq: Every day | CUTANEOUS | Status: DC
  Administered 2023-09-24: 6 via TOPICAL

## 2023-09-23 MED ORDER — PHENOBARBITAL 64.8 MG PO TABS
64.8000 mg | ORAL_TABLET | ORAL | Status: DC
  Administered 2023-09-23: 64.8 mg via ORAL
  Filled 2023-09-23: qty 2

## 2023-09-23 NOTE — TOC Initial Note (Signed)
 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  Expected Discharge Plan: Home/Self Care Barriers to Discharge: Continued Medical Work up   Patient Goals and CMS Choice Patient states their goals for this hospitalization and ongoing recovery are:: home CMS Medicare.gov Compare Post Acute Care list provided to:: Patient   [place] ownership interest in [facility].provided to:: Patient    Expected Discharge Plan and Services   Discharge Planning Services: CM Consult   Living arrangements for the past 2 months: Single Family Home                                      Prior Living Arrangements/Services Living arrangements for the past 2 months: Single Family Home Lives with:: Spouse Patient language and need for interpreter reviewed:: Yes Do you feel safe going back to the place where you live?: Yes      Need for Family Participation in Patient Care: Yes (Comment) Care giver support system in place?: Yes (comment) Current home services: DME ([employer]) Criminal Activity/Legal Involvement Pertinent to Current Situation/Hospitalization: No - Comment as needed  Activities of Daily Living   ADL Screening (condition at time of admission) Independently performs ADLs?: Yes (appropriate for developmental age) Is the patient deaf or have difficulty hearing?: No Does the patient have difficulty seeing, even when wearing  glasses/contacts?: No Does the patient have difficulty concentrating, remembering, or making decisions?: No  Permission Sought/Granted Permission sought to share information with : Case Manager Permission granted to share information with : Yes, Verbal Permission Granted  Share Information with NAME: Case Manager     Permission granted to share info w Relationship: [name] (spouse) [phone]     Emotional Assessment Appearance:: Appears stated age Attitude/Demeanor/Rapport: Gracious Affect (typically observed): Accepting Orientation: : Oriented to Self, Oriented to Place, Oriented to  Time, Oriented to Situation Alcohol  / Substance Use: Not Applicable Psych Involvement: No (comment)  Admission diagnosis:  Ileus (HCC) [K56.7] Constipation [K59.00] Patient Active Problem List   Diagnosis Date Noted   Constipation 09/22/2023   Age-related cognitive decline 07/05/2023   Current chronic use of systemic steroids 04/05/2023   Elevated hemoglobin (HCC) 01/22/2023   PCP NOTES >>>>>>>>>>>>>>>>>>> 09/16/2015   Multiple thyroid  nodules 03/30/2015   Annual physical exam 08/07/2014   Bloody discharge from nipple - s/p surgery, resolved  12/29/2013   Breast mass, right 12/11/2013   At high risk for falls 12/11/2013   Renal cysts, acquired, bilateral--per urology 05/04/2013   Elevated LFTs 04/01/2013   Cervical spinal stenosis 11/21/2012   Nondiabetic gastroparesis 08/20/2012   Hypothyroidism 08/08/2012   Gastroparesis 05/06/2012   Muscle cramp 01/19/2012   Fatigue 01/14/2012   Neurogenic bladder, prolapse ,h/o urinary retention, self cath, sees urology 11/05/2011   Fecal incontinence 10/03/2011   Osteoporosis 07/24/2011  Adenoma of cecum 07/17/2011   Chronic constipation 07/17/2011   Skin lesion 07/08/2011   Chronic neck pain 04/16/2011   Rectocele 04/16/2011   Hypersomnia-- on dextroamphetamine  04/16/2011   Vitamin D  deficiency 04/16/2011   Family history of colon  cancer 04/16/2011   Abnormal blood chemistry 04/16/2011   Anemia    COPD (chronic obstructive pulmonary disease) gold stage B    Depression    GERD (gastroesophageal reflux disease)    Hyperlipidemia    Hypertension    Allergic rhinitis    Pulmonary embolism-- in the 80s    Skin cancer    Seizures (HCC)    Chronic inflammatory demyelinating neuropathy (HCC)    PCP:  [name], MD Pharmacy:   [facility] [phone] [place], [place] - [address] AT [facility] [address] [place] [address] Phone: [phone] Fax: [phone]     Social Drivers of Health (SDOH) Social History: SDOH Screenings   Food Insecurity: No Food Insecurity (09/23/2023)  Housing: Low Risk  (09/23/2023)  Transportation Needs: No Transportation Needs (09/23/2023)  Utilities: Not At Risk (09/23/2023)  Alcohol  Screen: Low Risk  (12/03/2019)  Depression (PHQ2-9): Low Risk  (07/05/2023)  Financial Resource Strain: Low Risk  (11/16/2022)  Physical Activity: Inactive (11/16/2022)  Social Connections: Socially Integrated (09/22/2023)  Stress: No Stress Concern Present (11/16/2022)  Tobacco Use: Medium Risk (09/22/2023)   SDOH Interventions: Food Insecurity Interventions: Intervention Not Indicated, Inpatient TOC Housing Interventions: Intervention Not Indicated, Inpatient TOC Transportation Interventions: Intervention Not Indicated, Inpatient TOC Utilities Interventions: Intervention Not Indicated, Inpatient TOC   Readmission Risk Interventions     No data to display

## 2023-09-23 NOTE — Progress Notes (Signed)
 PROGRESS NOTE    [name]  UJW:[id number] DOB: May 06, 1933 DOA: 09/22/2023 PCP: [name], MD   Brief Narrative:  This 89 years old female with PMH significant for gastroparesis,chronic inflammatory demyelinating polyneuropathy, GERD, COPD on room air, partial colectomy presented in the ED with abdominal discomfort, bloating and constipation.  She reports having chronic constipation, normally takes stool softeners and MiraLAX  on a regular basis and bowels are managed in that way.  She had outpatient cystoscopy with right retrograde pyelogram done for right kidney stones on 4/23.  It seems that she has passed the kidney stone but has not seen it.  Patient also has history of neurogenic bladder and required intermittent catheterization in the past.  Since the procedure she had been having worsening abdominal pain,  bloating and burping.  She has not had any bowel movement since that time.  She has tried stool softeners and MiraLAX  at home as well as enema but it was unsuccessful.  CT abdomen and pelvis was done due to severity of her abdominal pain and consistent with small moderate constipation.  She was also noted to have retained urine,  Foley catheterization was placed.  Patient was admitted for further evaluation.  Assessment & Plan:   Principal Problem:   Constipation  Chronic Constipation: Patient presented with bloating, abdominal discomfort.   This is a chronic problem for her, She was recently given tramadol  due to kidney stone and this may have exacerbated her chronic constipation.  CT scan showed no acute complication from her recent procedure.  Constipation with retained stool. Continue IV fluid resuscitation. Clear liquid diet for now given her abdominal distention, advance as tolerated. Continue Pain and nausea medication as needed, avoid narcotics. Continue Scheduled stool softeners, with MiraLAX  twice daily as needed, sorbitol solution daily as needed Simethicone  4 times daily as needed. Patient request enema,  tapwater enema ordered as needed.   Hypertension: Continue amlodipine .   Urinary retention: probably also contributing to the patient's discomfort,  Foley catheter has been placed with return of greater than 900 cc of urine.   Would likely plan to keep this in until she follows up as an outpatient with urology.   GERD: Continue pantoprazole .   COPD: stable on room air without evidence of exacerbation.   Hypothyroidism: Continue Synthroid .   CIDP: Continue home gabapentin, Tegretol , etc.  DVT prophylaxis: Lovenox  Code Status: Full code Family Communication: No family at bedside Disposition Plan: Admitted for chronic constipation causing discomfort.  Consultants:  None  Procedures: None  Antimicrobials:  Anti-infectives (From admission, onward)    None      Subjective: Patient was seen and examined at bedside.  Overnight events noted. Patient still reports having abdominal discomfort, has not had bowel movement in last 9 days.  Asking for enema.  Objective: Vitals:   09/22/23 2145 09/23/23 0141 09/23/23 0550 09/23/23 0803  BP: 136/70 122/75 110/62   Pulse: 86 84 86   Resp: 17 17 17    Temp: (!) 97.5 F (36.4 C) (!) 97.3 F (36.3 C) 98.4 F (36.9 C)   TempSrc:      SpO2: 96% 97% 100% 96%  Weight:      Height:        Intake/Output Summary (Last 24 hours) at 09/23/2023 1000 Last data filed at 09/23/2023 0600 Gross per 24 hour  Intake 1119 ml  Output 1300 ml  Net -181 ml   Filed Weights   09/22/23 0855  Weight: 60.3 kg  Examination:  General exam: Appears calm and comfortable, deconditioned, not in any acute distress. Respiratory system: Clear to auscultation. Respiratory effort normal. RR16 Cardiovascular system: S1 & S2 heard, RRR. No JVD, murmurs, rubs, gallops or clicks Gastrointestinal system: Abdomen is soft, mildly tender,  mildly distended.  normal bowel sounds heard. Central nervous system:  Alert and oriented X 3. No focal neurological deficits. Extremities: No edema, no cyanosis, no clubbing Skin: No rashes, lesions or ulcers Psychiatry: Judgement and insight appear normal. Mood & affect appropriate.     Data Reviewed: I have personally reviewed following labs and imaging studies  CBC: Recent Labs  Lab 09/22/23 0954 09/23/23 0527  WBC 9.1 6.8  NEUTROABS 7.2  --   HGB 15.1* 12.6  HCT 44.6 38.6  MCV 93.1 95.3  PLT 192 184   Basic Metabolic Panel: Recent Labs  Lab 09/22/23 0954 09/23/23 0527  NA 133* 137  K 4.9 4.7  CL 99 106  CO2 26 24  GLUCOSE 104* 89  BUN 28* 19  CREATININE 1.07* 1.06*  CALCIUM  8.5* 7.7*  MG 3.0*  --    GFR: Estimated Creatinine Clearance: 30.8 mL/min (A) (by C-G formula based on SCr of 1.06 mg/dL (H)). Liver Function Tests: Recent Labs  Lab 09/22/23 0954  AST 31  ALT 24  ALKPHOS 74  BILITOT 0.8  PROT 7.4  ALBUMIN 3.8   Recent Labs  Lab 09/22/23 0954  LIPASE 32   No results for input(s): "AMMONIA" in the last 168 hours. Coagulation Profile: No results for input(s): "INR", "PROTIME" in the last 168 hours. Cardiac Enzymes: No results for input(s): "CKTOTAL", "CKMB", "CKMBINDEX", "TROPONINI" in the last 168 hours. BNP (last 3 results) No results for input(s): "PROBNP" in the last 8760 hours. HbA1C: No results for input(s): "HGBA1C" in the last 72 hours. CBG: No results for input(s): "GLUCAP" in the last 168 hours. Lipid Profile: No results for input(s): "CHOL", "HDL", "LDLCALC", "TRIG", "CHOLHDL", "LDLDIRECT" in the last 72 hours. Thyroid  Function Tests: No results for input(s): "TSH", "T4TOTAL", "FREET4", "T3FREE", "THYROIDAB" in the last 72 hours. Anemia Panel: No results for input(s): "VITAMINB12", "FOLATE", "FERRITIN", "TIBC", "IRON", "RETICCTPCT" in the last 72 hours. Sepsis Labs: No results for input(s): "PROCALCITON", "LATICACIDVEN" in the last 168 hours.  No results found for this or any previous visit  (from the past 240 hours).   Radiology Studies: CT ABDOMEN PELVIS WO CONTRAST Result Date: 09/22/2023 CLINICAL DATA:  Ileus. Progressive abdominal pain. Increased distension. EXAM: CT ABDOMEN AND PELVIS WITHOUT CONTRAST TECHNIQUE: Multidetector CT imaging of the abdomen and pelvis was performed following the standard protocol without IV contrast. RADIATION DOSE REDUCTION: This exam was performed according to the departmental dose-optimization program which includes automated exposure control, adjustment of the mA and/or kV according to patient size and/or use of iterative reconstruction technique. COMPARISON:  CT of the abdomen and pelvis at [facility] 09/21/2023 FINDINGS: Lower chest: Mild dependent atelectasis is present on the left. Heart size is normal. Coronary artery calcifications are present. Hepatobiliary: No focal liver abnormality is seen. No gallstones, gallbladder wall thickening, or biliary dilatation. Pancreas: Unremarkable. No pancreatic ductal dilatation or surrounding inflammatory changes. Spleen: Splenic calcifications stable.  Six Adrenals/Urinary Tract: Adrenal glands are normal bilaterally. A cyst at the lower pole of the left kidney demonstrates minimal mural calcifications. It measures 6.3 cm. A nonobstructing 7 mm stone is present at the upper pole of the left kidney. An 8 mm nonobstructing stone is present posteriorly in the right kidney. Additional punctate  nonobstructing stone is present the right kidney. The ureters are within normal limits bilaterally. The urinary bladder is collapsed with a Foley catheter in place. Stomach/Bowel: A small hiatal hernia is present. The stomach and duodenum are otherwise within normal limits. Small bowel is unremarkable. Terminal ileum is normal. Partial ascending colectomy is noted. The anastomosis is intact. Moderate stool present throughout the ascending and proximal transverse colon. No perforation is present. The descending and sigmoid  colon are normal. Vascular/Lymphatic: Atherosclerotic calcifications are present the aorta and branch vessels. No aneurysm present. No significant adenopathy is present. Marked soft tissue prominence is noted the presacral space. Reproductive: Status post hysterectomy. No adnexal masses. Other: No significant free fluid or free air is present. Musculoskeletal: Degenerative facet changes are most evident at C4-5. Vertebral body heights and alignment are otherwise normal scratched at vertebral body heights and alignment are normal. No acute or healing fractures are present. No focal osseous lesions are present. IMPRESSION: 1. No acute or focal lesion to explain the patient's symptoms. 2. Partial ascending colectomy. The anastomosis is intact. 3. Moderate stool throughout the ascending and proximal transverse colon. 4. Bilateral nonobstructing nephrolithiasis. 5. 6.3 cm cyst at the lower pole of the left kidney demonstrates minimal mural calcifications. This is stable in size. No follow-up imaging recommended. 6. Coronary artery disease. 7. Presacral soft tissue likely reflects a post inflammatory state. Malignancy is considered unlikely discussed. Electronically Signed   By: Audree Leas M.D.   On: 09/22/2023 12:13   Scheduled Meds:  amLODipine   10 mg Oral Daily   arformoterol   15 mcg Nebulization BID   budesonide   0.25 mg Nebulization BID   carbamazepine   200 mg Oral QHS   docusate sodium  100 mg Oral BID   enoxaparin  (LOVENOX ) injection  40 mg Subcutaneous Q24H   fluticasone   1 spray Each Nare Daily   gabapentin  300 mg Oral QHS   levothyroxine   50 mcg Oral QAC breakfast   lidocaine   1 patch Transdermal Q24H   montelukast   10 mg Oral QHS   pantoprazole  (PROTONIX ) IV  40 mg Intravenous Q24H   Continuous Infusions:  sodium chloride  Stopped (09/23/23 0805)     LOS: 0 days    Time spent: 50 mins    Magdalene School, MD [facility]   If 7PM-7AM, please contact night-coverage

## 2023-09-23 NOTE — Care Management Obs Status (Signed)
 MEDICARE OBSERVATION STATUS NOTIFICATION   Patient Details  Name: [name] MRN: [id number] Date of Birth: 1933/01/07   Medicare Observation Status Notification Given:  Yes    [name], RN 09/23/2023, 10:00 AM

## 2023-09-24 ENCOUNTER — Other Ambulatory Visit (HOSPITAL_COMMUNITY): Payer: Self-pay

## 2023-09-24 ENCOUNTER — Other Ambulatory Visit: Payer: Self-pay

## 2023-09-24 DIAGNOSIS — K5909 Other constipation: Secondary | ICD-10-CM | POA: Diagnosis not present

## 2023-09-24 DIAGNOSIS — K5901 Slow transit constipation: Secondary | ICD-10-CM | POA: Diagnosis not present

## 2023-09-24 LAB — CBC
HCT: 41.8 % (ref 36.0–46.0)
Hemoglobin: 13.5 g/dL (ref 12.0–15.0)
MCH: 31.2 pg (ref 26.0–34.0)
MCHC: 32.3 g/dL (ref 30.0–36.0)
MCV: 96.5 fL (ref 80.0–100.0)
Platelets: 223 10*3/uL (ref 150–400)
RBC: 4.33 MIL/uL (ref 3.87–5.11)
RDW: 13.1 % (ref 11.5–15.5)
WBC: 6 10*3/uL (ref 4.0–10.5)
nRBC: 0 % (ref 0.0–0.2)

## 2023-09-24 LAB — BASIC METABOLIC PANEL WITH GFR
Anion gap: 9 (ref 5–15)
BUN: 10 mg/dL (ref 8–23)
CO2: 23 mmol/L (ref 22–32)
Calcium: 8.1 mg/dL — ABNORMAL LOW (ref 8.9–10.3)
Chloride: 103 mmol/L (ref 98–111)
Creatinine, Ser: 0.85 mg/dL (ref 0.44–1.00)
GFR, Estimated: >60 mL/min (ref >60)
Glucose, Bld: 106 mg/dL — ABNORMAL HIGH (ref 70–99)
Potassium: 4.2 mmol/L (ref 3.5–5.1)
Sodium: 135 mmol/L (ref 135–145)

## 2023-09-24 LAB — MAGNESIUM: Magnesium: 2.5 mg/dL — ABNORMAL HIGH (ref 1.7–2.4)

## 2023-09-24 LAB — PHOSPHORUS: Phosphorus: 1.9 mg/dL — ABNORMAL LOW (ref 2.5–4.6)

## 2023-09-24 MED ORDER — SENNOSIDES-DOCUSATE SODIUM 8.6-50 MG PO TABS
1.0000 | ORAL_TABLET | Freq: Two times a day (BID) | ORAL | 0 refills | Status: DC
  Filled 2023-09-24: qty 60, 30d supply, fill #0

## 2023-09-24 MED ORDER — PREDNISONE 5 MG PO TABS
10.0000 mg | ORAL_TABLET | Freq: Every day | ORAL | Status: DC
  Administered 2023-09-24: 10 mg via ORAL
  Filled 2023-09-24: qty 2

## 2023-09-24 MED ORDER — K PHOS MONO-SOD PHOS DI & MONO 155-852-130 MG PO TABS
250.0000 mg | ORAL_TABLET | Freq: Three times a day (TID) | ORAL | Status: DC
  Administered 2023-09-24: 250 mg via ORAL
  Filled 2023-09-24: qty 1

## 2023-09-24 MED ORDER — K PHOS MONO-SOD PHOS DI & MONO 155-852-130 MG PO TABS
250.0000 mg | ORAL_TABLET | Freq: Three times a day (TID) | ORAL | 0 refills | Status: AC
Start: 2023-09-24 — End: 2023-09-27

## 2023-09-24 MED ORDER — DOCUSATE SODIUM 100 MG PO CAPS: 100.0000 mg | ORAL_CAPSULE | Freq: Three times a day (TID) | ORAL | Status: DC

## 2023-09-24 MED ORDER — K PHOS MONO-SOD PHOS DI & MONO 155-852-130 MG PO TABS
250.0000 mg | ORAL_TABLET | Freq: Three times a day (TID) | ORAL | 0 refills | Status: DC
  Filled 2023-09-24: qty 9, 3d supply, fill #0

## 2023-09-24 MED ORDER — POLYETHYLENE GLYCOL 3350 17 G PO PACK: 17.0000 g | PACK | Freq: Two times a day (BID) | ORAL | Status: DC

## 2023-09-24 MED ORDER — SENNOSIDES-DOCUSATE SODIUM 8.6-50 MG PO TABS
1.0000 | ORAL_TABLET | Freq: Two times a day (BID) | ORAL | Status: DC
  Administered 2023-09-24: 1 via ORAL
  Filled 2023-09-24: qty 1

## 2023-09-24 MED ORDER — SORBITOL 70 % SOLN
30.0000 mL | Freq: Every day | Status: DC
  Administered 2023-09-24: 30 mL via ORAL
  Filled 2023-09-24 (×2): qty 30

## 2023-09-24 NOTE — Discharge Instructions (Signed)
 Advised high fiber diet, advised to take senokot regularly

## 2023-09-24 NOTE — Discharge Summary (Addendum)
 Physician Discharge Summary  Mosley Stitz MVH:[id number] DOB: 1932-10-02 DOA: 09/22/2023  PCP: [name], MD  Admit date: 09/22/2023  Discharge date: 09/24/2023  Admitted From: Home  Disposition:  Home  Recommendations for Outpatient Follow-up:  Follow up with PCP in 1-2 weeks Please obtain BMP/CBC in one week Advised high fiber diet, advised to take senokot regularly  Home Health:None Equipment/Devices:None  Discharge Condition: Stable CODE STATUS:Full code Diet recommendation: Heart Healthy   Brief [facility] Course: This 89 years old female with PMH significant for gastroparesis, chronic inflammatory demyelinating polyneuropathy, GERD, COPD on room air, partial colectomy presented in the ED with abdominal discomfort, bloating and constipation. She reports having chronic constipation, normally takes stool softeners and MiraLAX  on a regular basis and bowels are managed in that way. She had outpatient cystoscopy with right retrograde pyelogram done for right kidney stones on 4/23. It seems that she has passed the kidney stone but has not seen it. Patient also has history of neurogenic bladder and required intermittent catheterization in the past. Since the procedure she had been having worsening abdominal pain, bloating and burping. She has not had any bowel movement since that time. She has tried stool softeners and MiraLAX  at home as well as enema but it was unsuccessful. CT abdomen and pelvis was done due to severity of her abdominal pain and consistent with small moderate constipation. She was also noted to have retained urine, Foley catheterization was placed. Patient was admitted for further evaluation.  Patient was started on aggressive bowel regimen.  Subsequently patient has a big bowel movement.  She feels relieved , feels much better and wants to be discharged.  Patient is being discharged home.   Discharge Diagnoses:  Principal Problem:    Constipation  Chronic Constipation: Patient presented with bloating, abdominal discomfort.   This is a chronic problem for her, She was recently given tramadol  due to kidney stone and this may have exacerbated her chronic constipation.  CT scan showed no acute complication from her recent procedure.Constipation with retained stool. Continue IV fluid resuscitation. Clear liquid diet for now given her abdominal distention, advance as tolerated. Continue Pain and nausea medication as needed, avoid narcotics. Continue Scheduled stool softeners, with MiraLAX  twice daily , senokot, sorbitol solution daily,  Simethicone 4 times daily as needed. Patient has request enema,  tapwater enema ordered as needed. Patient has not any bowel movement after enema was given. Continue Senokot, MiraLAX , sorbitol, Colace. Patient has big bowel movement.  She feels relieved and wants to be discharged.     Hypertension: Continue amlodipine .   Urinary retention: probably also contributing to the patient's discomfort,  Foley catheter has been placed with return of greater than 900 cc of urine.   Would likely plan to keep this in until she follows up as an outpatient with urology. Patient has appointment with urologist in 1 week where Foley can be removed.   GERD: Continue pantoprazole .   COPD: stable on room air without evidence of exacerbation.   Hypothyroidism: Continue Synthroid .   CIDP: Continue home gabapentin, Tegretol , etc.  Discharge Instructions  Discharge Instructions     Call MD for:  difficulty breathing, headache or visual disturbances   Complete by: As directed    Call MD for:  persistant dizziness or light-headedness   Complete by: As directed    Call MD for:  persistant nausea and vomiting   Complete by: As directed    Diet - low sodium heart healthy   Complete  by: As directed    Diet Carb Modified   Complete by: As directed    Discharge instructions   Complete by: As  directed    Advised to follow up PCP in one week. Advised high fiber diet, advised to take senokot regularly   Increase activity slowly   Complete by: As directed       Allergies as of 09/24/2023       Reactions   Iodinated Contrast Media Anaphylaxis   Cardiac arrest   Iodine Other (See Comments)   Cardiac arrest As young adult, received iodinated contrast for IVP, went into cardiac arrest, was resuscitated and hospitalized. Has not received iodinated contrast since that episode. Reports no adverse reaction to betadine.    Metrizamide Other (See Comments)   Cardiac arrest   Morphine Other (See Comments)   Cardiac arrest Patient has previously tolerated hydrocodone, hydromorphone  and fentanyl    Morphine And Codeine Other (See Comments)   Cardiac arrest   Cocklebur    Lambs Quarters    Mugwort    Sheep Sorrel    Spiny Pigweed (amaranthus Spinosus) Skin Test    Tramadol  Other (See Comments)   Causing severe constipation         Medication List     STOP taking these medications    doxycycline  100 MG tablet Commonly known as: VIBRA -TABS   traMADol  50 MG tablet Commonly known as: Ultram        TAKE these medications    amLODipine  10 MG tablet Commonly known as: NORVASC  TAKE 1 TABLET(10 MG) BY MOUTH DAILY   arformoterol  15 MCG/2ML Nebu Commonly known as: BROVANA  Take 15 mcg by nebulization See admin instructions. Inhale two to three times daily.   atorvastatin  40 MG tablet Commonly known as: LIPITOR TAKE 1 TABLET BY MOUTH EVERY NIGHT AT BEDTIME   budesonide  0.25 MG/2ML nebulizer solution Commonly known as: PULMICORT  Take 0.25 mg by nebulization See admin instructions. Inhale to two to three times daily.   CALCIUM  PO Take 600 mg by mouth daily.   carbamazepine  100 MG 12 hr tablet Commonly known as: TEGRETOL  XR Take 200 mg by mouth at bedtime.   Cholecalciferol 25 MCG (1000 UT) capsule Take 1,000 Units by mouth daily.   COLACE PO Take 1 tablet by  mouth daily.   gabapentin 300 MG capsule Commonly known as: NEURONTIN Take 300 mg by mouth at bedtime.   levothyroxine  50 MCG tablet Commonly known as: SYNTHROID  TAKE 1 TABLET(50 MCG) BY MOUTH DAILY BEFORE BREAKFAST What changed: See the new instructions.   mometasone  50 MCG/ACT nasal spray Commonly known as: NASONEX  Place 2 sprays into the nose in the morning and at bedtime.   montelukast  10 MG tablet Commonly known as: SINGULAIR  TAKE 1 TABLET(10 MG) BY MOUTH AT BEDTIME   NONFORMULARY OR COMPOUNDED ITEM Take 1 Inhalation by mouth daily. Mometasone /levofloxacin compounded inhalation   ondansetron  4 MG tablet Commonly known as: ZOFRAN  Take 4 mg by mouth every 6 (six) hours.   PHENobarbital  32.4 MG tablet Commonly known as: LUMINAL TAKE 1 TABLET BY MOUTH AT 6 AM AND 2 TABLETS BY MOUTH AT 8 PM.   phosphorus 155-852-130 MG tablet Commonly known as: K PHOS NEUTRAL Take 1 tablet (250 mg total) by mouth 3 (three) times daily for 3 days.   predniSONE  5 MG tablet Commonly known as: DELTASONE  Take 10 mg by mouth daily.   Stool Softener/Laxative 50-8.6 MG tablet Generic drug: senna-docusate Take 1 tablet by mouth 2 (two) times daily.  albuterol  (2.5 MG/3ML) 0.083% nebulizer solution Commonly known as: PROVENTIL  Take 2.5 mg by nebulization every 4 (four) hours as needed for shortness of breath or wheezing.   Ventolin  HFA 108 (90 Base) MCG/ACT inhaler Generic drug: albuterol  Inhale 1-2 puffs into the lungs every 4 (four) hours as needed for wheezing or shortness of breath.        Follow-up Information     [name], MD Follow up in 1 week(s).   Specialty: Family Medicine Contact information: [address] [place] [address] [phone]                Allergies  Allergen Reactions   Iodinated Contrast Media Anaphylaxis    Cardiac arrest   Iodine Other (See Comments)    Cardiac arrest As young adult, received iodinated contrast for IVP,  went into cardiac arrest, was resuscitated and hospitalized. Has not received iodinated contrast since that episode. Reports no adverse reaction to betadine.    Metrizamide Other (See Comments)    Cardiac arrest     Morphine Other (See Comments)    Cardiac arrest Patient has previously tolerated hydrocodone, hydromorphone  and fentanyl     Morphine And Codeine Other (See Comments)    Cardiac arrest   Cocklebur    Lambs Quarters    Mugwort    Sheep Sorrel    Spiny Pigweed (Amaranthus Spinosus) Skin Test    Tramadol  Other (See Comments)    Causing severe constipation     Consultations: None   Procedures/Studies: CT ABDOMEN PELVIS WO CONTRAST Result Date: 09/22/2023 CLINICAL DATA:  Ileus. Progressive abdominal pain. Increased distension. EXAM: CT ABDOMEN AND PELVIS WITHOUT CONTRAST TECHNIQUE: Multidetector CT imaging of the abdomen and pelvis was performed following the standard protocol without IV contrast. RADIATION DOSE REDUCTION: This exam was performed according to the departmental dose-optimization program which includes automated exposure control, adjustment of the mA and/or kV according to patient size and/or use of iterative reconstruction technique. COMPARISON:  CT of the abdomen and pelvis at [facility] 09/21/2023 FINDINGS: Lower chest: Mild dependent atelectasis is present on the left. Heart size is normal. Coronary artery calcifications are present. Hepatobiliary: No focal liver abnormality is seen. No gallstones, gallbladder wall thickening, or biliary dilatation. Pancreas: Unremarkable. No pancreatic ductal dilatation or surrounding inflammatory changes. Spleen: Splenic calcifications stable.  Six Adrenals/Urinary Tract: Adrenal glands are normal bilaterally. A cyst at the lower pole of the left kidney demonstrates minimal mural calcifications. It measures 6.3 cm. A nonobstructing 7 mm stone is present at the upper pole of the left kidney. An 8 mm nonobstructing stone is  present posteriorly in the right kidney. Additional punctate nonobstructing stone is present the right kidney. The ureters are within normal limits bilaterally. The urinary bladder is collapsed with a Foley catheter in place. Stomach/Bowel: A small hiatal hernia is present. The stomach and duodenum are otherwise within normal limits. Small bowel is unremarkable. Terminal ileum is normal. Partial ascending colectomy is noted. The anastomosis is intact. Moderate stool present throughout the ascending and proximal transverse colon. No perforation is present. The descending and sigmoid colon are normal. Vascular/Lymphatic: Atherosclerotic calcifications are present the aorta and branch vessels. No aneurysm present. No significant adenopathy is present. Marked soft tissue prominence is noted the presacral space. Reproductive: Status post hysterectomy. No adnexal masses. Other: No significant free fluid or free air is present. Musculoskeletal: Degenerative facet changes are most evident at C4-5. Vertebral body heights and alignment are otherwise normal scratched at vertebral body heights  and alignment are normal. No acute or healing fractures are present. No focal osseous lesions are present. IMPRESSION: 1. No acute or focal lesion to explain the patient's symptoms. 2. Partial ascending colectomy. The anastomosis is intact. 3. Moderate stool throughout the ascending and proximal transverse colon. 4. Bilateral nonobstructing nephrolithiasis. 5. 6.3 cm cyst at the lower pole of the left kidney demonstrates minimal mural calcifications. This is stable in size. No follow-up imaging recommended. 6. Coronary artery disease. 7. Presacral soft tissue likely reflects a post inflammatory state. Malignancy is considered unlikely discussed. Electronically Signed   By: Audree Leas M.D.   On: 09/22/2023 12:13   DG C-Arm 1-60 Min-No Report Result Date: 09/19/2023 Fluoroscopy was utilized by the requesting physician.  No  radiographic interpretation.     Subjective: Patient was seen and examined at bedside.  Overnight events noted.   Patient has a big bowel movement.  She feels relieved and wants to be discharged.  Discharge Exam: Vitals:   09/24/23 0530 09/24/23 0844  BP: (!) 150/73   Pulse: 87   Resp: 18   Temp: 98 F (36.7 C)   SpO2: 97% 98%   Vitals:   09/23/23 2008 09/23/23 2025 09/24/23 0530 09/24/23 0844  BP:  (!) 155/78 (!) 150/73   Pulse:  91 87   Resp:  18 18   Temp:  98.8 F (37.1 C) 98 F (36.7 C)   TempSrc:  Oral Oral   SpO2: 97% 98% 97% 98%  Weight:      Height:        General: Pt is alert, awake, not in acute distress Cardiovascular: RRR, S1/S2 +, no rubs, no gallops Respiratory: CTA bilaterally, no wheezing, no rhonchi Abdominal: Soft, NT, ND, bowel sounds + Extremities: no edema, no cyanosis    The results of significant diagnostics from this hospitalization (including imaging, microbiology, ancillary and laboratory) are listed below for reference.     Microbiology: No results found for this or any previous visit (from the past 240 hours).   Labs: BNP (last 3 results) No results for input(s): "BNP" in the last 8760 hours. Basic Metabolic Panel: Recent Labs  Lab 09/22/23 0954 09/23/23 0527 09/24/23 0412  NA 133* 137 135  K 4.9 4.7 4.2  CL 99 106 103  CO2 26 24 23   GLUCOSE 104* 89 106*  BUN 28* 19 10  CREATININE 1.07* 1.06* 0.85  CALCIUM  8.5* 7.7* 8.1*  MG 3.0*  --  2.5*  PHOS  --   --  1.9*   Liver Function Tests: Recent Labs  Lab 09/22/23 0954  AST 31  ALT 24  ALKPHOS 74  BILITOT 0.8  PROT 7.4  ALBUMIN 3.8   Recent Labs  Lab 09/22/23 0954  LIPASE 32   No results for input(s): "AMMONIA" in the last 168 hours. CBC: Recent Labs  Lab 09/22/23 0954 09/23/23 0527 09/24/23 0412  WBC 9.1 6.8 6.0  NEUTROABS 7.2  --   --   HGB 15.1* 12.6 13.5  HCT 44.6 38.6 41.8  MCV 93.1 95.3 96.5  PLT 192 184 223   Cardiac Enzymes: No results for  input(s): "CKTOTAL", "CKMB", "CKMBINDEX", "TROPONINI" in the last 168 hours. BNP: Invalid input(s): "POCBNP" CBG: No results for input(s): "GLUCAP" in the last 168 hours. D-Dimer No results for input(s): "DDIMER" in the last 72 hours. Hgb A1c No results for input(s): "HGBA1C" in the last 72 hours. Lipid Profile No results for input(s): "CHOL", "HDL", "LDLCALC", "TRIG", "CHOLHDL", "LDLDIRECT" in the  last 72 hours. Thyroid  function studies No results for input(s): "TSH", "T4TOTAL", "T3FREE", "THYROIDAB" in the last 72 hours.  Invalid input(s): "FREET3" Anemia work up No results for input(s): "VITAMINB12", "FOLATE", "FERRITIN", "TIBC", "IRON", "RETICCTPCT" in the last 72 hours. Urinalysis    Component Value Date/Time   COLORURINE STRAW (A) 09/22/2023 1110   APPEARANCEUR CLEAR 09/22/2023 1110   LABSPEC 1.003 (L) 09/22/2023 1110   PHURINE 7.0 09/22/2023 1110   GLUCOSEU NEGATIVE 09/22/2023 1110   HGBUR LARGE (A) 09/22/2023 1110   BILIRUBINUR NEGATIVE 09/22/2023 1110   KETONESUR 20 (A) 09/22/2023 1110   PROTEINUR NEGATIVE 09/22/2023 1110   UROBILINOGEN 0.2 12/06/2012 1917   NITRITE NEGATIVE 09/22/2023 1110   LEUKOCYTESUR SMALL (A) 09/22/2023 1110   Sepsis Labs Recent Labs  Lab 09/22/23 0954 09/23/23 0527 09/24/23 0412  WBC 9.1 6.8 6.0   Microbiology No results found for this or any previous visit (from the past 240 hours).   Time coordinating discharge: Over 30 minutes  SIGNED:   Magdalene School, MD  [facility] 09/24/2023, 2:51 PM Pager   If 7PM-7AM, please contact night-coverage

## 2023-09-24 NOTE — Progress Notes (Signed)
 Patient discharged home, IV removed, discharge paperwork provided and explained to both patient as well as patient's husband, patient and patient's husband verbalized understanding.

## 2023-09-24 NOTE — Progress Notes (Signed)
 PROGRESS NOTE    [name]  WGN:[id number] DOB: 11-16-1932 DOA: 09/22/2023 PCP: [name], MD   Brief Narrative:  This 89 years old female with PMH significant for gastroparesis,chronic inflammatory demyelinating polyneuropathy, GERD, COPD on room air, partial colectomy presented in the ED with abdominal discomfort, bloating and constipation.  She reports having chronic constipation, normally takes stool softeners and MiraLAX  on a regular basis and bowels are managed in that way.  She had outpatient cystoscopy with right retrograde pyelogram done for right kidney stones on 4/23.  It seems that she has passed the kidney stone but has not seen it.  Patient also has history of neurogenic bladder and required intermittent catheterization in the past.  Since the procedure she had been having worsening abdominal pain,  bloating and burping.  She has not had any bowel movement since that time.  She has tried stool softeners and MiraLAX  at home as well as enema but it was unsuccessful.  CT abdomen and pelvis was done due to severity of her abdominal pain and consistent with small moderate constipation.  She was also noted to have retained urine,  Foley catheterization was placed.  Patient was admitted for further evaluation.  Assessment & Plan:   Principal Problem:   Constipation  Chronic Constipation: Patient presented with bloating, abdominal discomfort.   This is a chronic problem for her, She was recently given tramadol  due to kidney stone and this may have exacerbated her chronic constipation.  CT scan showed no acute complication from her recent procedure.Constipation with retained stool. Continue IV fluid resuscitation. Clear liquid diet for now given her abdominal distention, advance as tolerated. Continue Pain and nausea medication as needed, avoid narcotics. Continue Scheduled stool softeners, with MiraLAX  twice daily , senokot, sorbitol solution daily,  Simethicone 4 times  daily as needed. Patient has request enema,  tapwater enema ordered as needed. Patient has not any bowel movement after enema was given. Continue Senokot, MiraLAX , sorbitol, Colace.    Hypertension: Continue amlodipine .   Urinary retention: probably also contributing to the patient's discomfort,  Foley catheter has been placed with return of greater than 900 cc of urine.   Would likely plan to keep this in until she follows up as an outpatient with urology.   GERD: Continue pantoprazole .   COPD: stable on room air without evidence of exacerbation.   Hypothyroidism: Continue Synthroid .   CIDP: Continue home gabapentin, Tegretol , etc.  DVT prophylaxis: Lovenox  Code Status: Full code Family Communication: No family at bedside. Disposition Plan: Admitted for chronic constipation causing discomfort.  Consultants:  None  Procedures: None  Antimicrobials:  Anti-infectives (From admission, onward)    None      Subjective: Patient was seen and examined at bedside.  Overnight events noted. Patient reports a lot of discomfort, has not had a bowel movement even after enema was given. All her bowel regimen medications were as needed.    Objective: Vitals:   09/23/23 2008 09/23/23 2025 09/24/23 0530 09/24/23 0844  BP:  (!) 155/78 (!) 150/73   Pulse:  91 87   Resp:  18 18   Temp:  98.8 F (37.1 C) 98 F (36.7 C)   TempSrc:  Oral Oral   SpO2: 97% 98% 97% 98%  Weight:      Height:        Intake/Output Summary (Last 24 hours) at 09/24/2023 1052 Last data filed at 09/24/2023 1000 Gross per 24 hour  Intake 960 ml  Output 2550 ml  Net -1590 ml   Filed Weights   09/22/23 0855  Weight: 60.3 kg    Examination:  General exam: Appears calm and comfortable, deconditioned, not in any acute distress. Respiratory system: CTA bilaterally. Respiratory effort normal. RR16 Cardiovascular system: S1 & S2 heard, RRR. No JVD, murmurs, rubs, gallops or clicks Gastrointestinal  system: Abdomen is soft, mildly tender,  mildly distended.  normal bowel sounds heard. Central nervous system: Alert and oriented X 3. No focal neurological deficits. Extremities: No edema, no cyanosis, no clubbing Skin: No rashes, lesions or ulcers Psychiatry: Judgement and insight appear normal. Mood & affect appropriate.     Data Reviewed: I have personally reviewed following labs and imaging studies  CBC: Recent Labs  Lab 09/22/23 0954 09/23/23 0527 09/24/23 0412  WBC 9.1 6.8 6.0  NEUTROABS 7.2  --   --   HGB 15.1* 12.6 13.5  HCT 44.6 38.6 41.8  MCV 93.1 95.3 96.5  PLT 192 184 223   Basic Metabolic Panel: Recent Labs  Lab 09/22/23 0954 09/23/23 0527 09/24/23 0412  NA 133* 137 135  K 4.9 4.7 4.2  CL 99 106 103  CO2 26 24 23   GLUCOSE 104* 89 106*  BUN 28* 19 10  CREATININE 1.07* 1.06* 0.85  CALCIUM  8.5* 7.7* 8.1*  MG 3.0*  --  2.5*  PHOS  --   --  1.9*   GFR: Estimated Creatinine Clearance: 38.4 mL/min (by C-G formula based on SCr of 0.85 mg/dL). Liver Function Tests: Recent Labs  Lab 09/22/23 0954  AST 31  ALT 24  ALKPHOS 74  BILITOT 0.8  PROT 7.4  ALBUMIN 3.8   Recent Labs  Lab 09/22/23 0954  LIPASE 32   No results for input(s): "AMMONIA" in the last 168 hours. Coagulation Profile: No results for input(s): "INR", "PROTIME" in the last 168 hours. Cardiac Enzymes: No results for input(s): "CKTOTAL", "CKMB", "CKMBINDEX", "TROPONINI" in the last 168 hours. BNP (last 3 results) No results for input(s): "PROBNP" in the last 8760 hours. HbA1C: No results for input(s): "HGBA1C" in the last 72 hours. CBG: No results for input(s): "GLUCAP" in the last 168 hours. Lipid Profile: No results for input(s): "CHOL", "HDL", "LDLCALC", "TRIG", "CHOLHDL", "LDLDIRECT" in the last 72 hours. Thyroid  Function Tests: No results for input(s): "TSH", "T4TOTAL", "FREET4", "T3FREE", "THYROIDAB" in the last 72 hours. Anemia Panel: No results for input(s): "VITAMINB12",  "FOLATE", "FERRITIN", "TIBC", "IRON", "RETICCTPCT" in the last 72 hours. Sepsis Labs: No results for input(s): "PROCALCITON", "LATICACIDVEN" in the last 168 hours.  No results found for this or any previous visit (from the past 240 hours).   Radiology Studies: CT ABDOMEN PELVIS WO CONTRAST Result Date: 09/22/2023 CLINICAL DATA:  Ileus. Progressive abdominal pain. Increased distension. EXAM: CT ABDOMEN AND PELVIS WITHOUT CONTRAST TECHNIQUE: Multidetector CT imaging of the abdomen and pelvis was performed following the standard protocol without IV contrast. RADIATION DOSE REDUCTION: This exam was performed according to the departmental dose-optimization program which includes automated exposure control, adjustment of the mA and/or kV according to patient size and/or use of iterative reconstruction technique. COMPARISON:  CT of the abdomen and pelvis at [facility] 09/21/2023 FINDINGS: Lower chest: Mild dependent atelectasis is present on the left. Heart size is normal. Coronary artery calcifications are present. Hepatobiliary: No focal liver abnormality is seen. No gallstones, gallbladder wall thickening, or biliary dilatation. Pancreas: Unremarkable. No pancreatic ductal dilatation or surrounding inflammatory changes. Spleen: Splenic calcifications stable.  Six Adrenals/Urinary Tract: Adrenal glands are normal bilaterally. A cyst  at the lower pole of the left kidney demonstrates minimal mural calcifications. It measures 6.3 cm. A nonobstructing 7 mm stone is present at the upper pole of the left kidney. An 8 mm nonobstructing stone is present posteriorly in the right kidney. Additional punctate nonobstructing stone is present the right kidney. The ureters are within normal limits bilaterally. The urinary bladder is collapsed with a Foley catheter in place. Stomach/Bowel: A small hiatal hernia is present. The stomach and duodenum are otherwise within normal limits. Small bowel is unremarkable. Terminal  ileum is normal. Partial ascending colectomy is noted. The anastomosis is intact. Moderate stool present throughout the ascending and proximal transverse colon. No perforation is present. The descending and sigmoid colon are normal. Vascular/Lymphatic: Atherosclerotic calcifications are present the aorta and branch vessels. No aneurysm present. No significant adenopathy is present. Marked soft tissue prominence is noted the presacral space. Reproductive: Status post hysterectomy. No adnexal masses. Other: No significant free fluid or free air is present. Musculoskeletal: Degenerative facet changes are most evident at C4-5. Vertebral body heights and alignment are otherwise normal scratched at vertebral body heights and alignment are normal. No acute or healing fractures are present. No focal osseous lesions are present. IMPRESSION: 1. No acute or focal lesion to explain the patient's symptoms. 2. Partial ascending colectomy. The anastomosis is intact. 3. Moderate stool throughout the ascending and proximal transverse colon. 4. Bilateral nonobstructing nephrolithiasis. 5. 6.3 cm cyst at the lower pole of the left kidney demonstrates minimal mural calcifications. This is stable in size. No follow-up imaging recommended. 6. Coronary artery disease. 7. Presacral soft tissue likely reflects a post inflammatory state. Malignancy is considered unlikely discussed. Electronically Signed   By: Audree Leas M.D.   On: 09/22/2023 12:13   Scheduled Meds:  amLODipine   10 mg Oral Daily   arformoterol   15 mcg Nebulization BID   budesonide   0.25 mg Nebulization BID   carbamazepine   200 mg Oral QHS   Chlorhexidine  Gluconate Cloth  6 each Topical Daily   docusate sodium  100 mg Oral TID   enoxaparin  (LOVENOX ) injection  40 mg Subcutaneous Q24H   fluticasone   1 spray Each Nare Daily   gabapentin  300 mg Oral QHS   levothyroxine   50 mcg Oral QAC breakfast   lidocaine   1 patch Transdermal BID   montelukast   10 mg  Oral QHS   pantoprazole  (PROTONIX ) IV  40 mg Intravenous Q24H   phenobarbital   32.4 mg Oral Daily   phenobarbital   64.8 mg Oral Q24H   phosphorus  250 mg Oral TID   polyethylene glycol  17 g Oral BID   predniSONE   10 mg Oral Daily   senna-docusate  1 tablet Oral BID   sorbitol  30 mL Oral Daily   Continuous Infusions:     LOS: 0 days    Time spent: 35 mins    Magdalene School, MD [facility]   If 7PM-7AM, please contact night-coverage

## 2023-09-26 DIAGNOSIS — N319 Neuromuscular dysfunction of bladder, unspecified: Secondary | ICD-10-CM | POA: Diagnosis not present

## 2023-09-26 DIAGNOSIS — N202 Calculus of kidney with calculus of ureter: Secondary | ICD-10-CM | POA: Diagnosis not present

## 2023-10-01 DIAGNOSIS — J449 Chronic obstructive pulmonary disease, unspecified: Secondary | ICD-10-CM | POA: Diagnosis not present

## 2023-10-08 DIAGNOSIS — N202 Calculus of kidney with calculus of ureter: Secondary | ICD-10-CM | POA: Diagnosis not present

## 2023-10-08 DIAGNOSIS — N319 Neuromuscular dysfunction of bladder, unspecified: Secondary | ICD-10-CM | POA: Diagnosis not present

## 2023-10-08 DIAGNOSIS — N281 Cyst of kidney, acquired: Secondary | ICD-10-CM | POA: Diagnosis not present

## 2023-10-16 ENCOUNTER — Ambulatory Visit

## 2023-10-16 ENCOUNTER — Encounter: Payer: Self-pay | Admitting: Family Medicine

## 2023-10-16 ENCOUNTER — Telehealth: Payer: Self-pay

## 2023-10-16 ENCOUNTER — Ambulatory Visit (INDEPENDENT_AMBULATORY_CARE_PROVIDER_SITE_OTHER): Admitting: Family Medicine

## 2023-10-16 VITALS — BP 117/64 | HR 85 | Temp 97.7°F | Resp 18 | Ht 62.0 in | Wt 131.2 lb

## 2023-10-16 DIAGNOSIS — M810 Age-related osteoporosis without current pathological fracture: Secondary | ICD-10-CM | POA: Diagnosis not present

## 2023-10-16 DIAGNOSIS — K5909 Other constipation: Secondary | ICD-10-CM

## 2023-10-16 DIAGNOSIS — R5383 Other fatigue: Secondary | ICD-10-CM | POA: Diagnosis not present

## 2023-10-16 MED ORDER — ROMOSOZUMAB-AQQG 105 MG/1.17ML ~~LOC~~ SOSY
210.0000 mg | PREFILLED_SYRINGE | SUBCUTANEOUS | Status: AC
  Administered 2023-11-15: 210 mg via SUBCUTANEOUS

## 2023-10-16 NOTE — Progress Notes (Signed)
 Subjective:     Patient ID: [name], female    DOB: 06-06-1932, 89 y.o.   MRN: [id number]  Chief Complaint  Patient presents with   Fatigue    Always tired, sleep all the time    HPI Zyrtec, 2 benadryl  Discussed the use of AI scribe software for clinical note transcription with the patient, who gave verbal consent to proceed.  History of Present Illness [name] is an 89 year old female with a history of kidney stones and bowel obstructions who presents with fatigue and bowel movement irregularities. She is accompanied by her husband.  She has been experiencing significant fatigue for the past six to eight weeks, feeling exhausted within three hours of waking up despite sleeping well at night. The fatigue has worsened since her recent hospitalization for a kidney stone procedure. Then urinary retention and ileus  She was hospitalized following a procedure to remove a kidney stone, which was initially identified after a scan showed kidney blockage. Post-procedure, she experienced significant bloating and urinary retention, requiring catheterization to relieve pressure, draining 44 ounces of urine. Despite the procedure, bloating persisted.  She has a history of constipation, with a recent episode occurring six weeks ago. During her hospital stay, she initially did not receive her prescribed bowel medications due to a misunderstanding, which was later corrected, allowing her to have a bowel movement and be discharged. She continues to manage her bowel movements with daily MiraLAX , stool softeners, and Colace, but reports irregular bowel movements, often producing small, hard stools. She has a history of impaction and has required manual disimpaction in the past.  Her current medications include two [place] allergy pills at night, phenobarbital , carbamazepine , and MiraLAX . She also uses Salonpas patches and takes Tylenol  for neck pain, which has worsened since her  hospital stay. She has a history of NMO and a neck plate with screws, contributing to her chronic neck pain.  Her family history includes similar bowel issues, as her mother and daughter also experience slow bowel movements. Her social history reveals that she prefers drinking with a straw due to neck discomfort, and she dilutes her drinks to increase fluid intake.    Health Maintenance Due  Topic Date Due   Medicare Annual Wellness (AWV)  11/16/2023    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 (HCC) 03/2011   chronic sinusitis 08/08/2012   Colon polyp    Constipation    COPD (chronic obstructive pulmonary disease) (HCC)    Cystocele 09/16/2012   GERD (gastroesophageal reflux disease)    History of kidney stones    Hyperlipidemia    Hypertension    Hypothyroid    Neurogenic bladder 2013   husband caths pt, manages with timed void   Osteoporosis    Pulmonary embolism (HCC)    1980s   Raynaud phenomenon    Renal cyst    Non-complex, contrast MRI 2013 with L>R non-complex cysts, dominant 3.7 left lower pole   Seizures (HCC)    1990 last seizures on meds Phenobarb   Shingles late 90's   Skin cancer    squamous cell   Thyroid  nodule    Right thyroid  lobe, only seen on Sagittal imaging measures  2.3 cm in craniocaudal dimension and appears stable   Tubular adenoma    Vitamin D  deficiency     Past Surgical History:  Procedure Laterality Date   ABDOMINAL HYSTERECTOMY     ANTERIOR FUSION CLIVUS-C2 EXTRAORAL W/ ODONTOID EXCISION  12/2012   APPENDECTOMY     ARTHROSCOPIC REPAIR ACL Left 1976   BASAL CELL CARCINOMA EXCISION     face   BLADDER SUSPENSION     BREAST DUCTAL SYSTEM EXCISION Right 01/15/2014   Procedure: EXCISION DUCTAL SYSTEM RIGHT BREAST;  Surgeon:  Lockie Rima, MD;  Location: [facility];  Service: General;  Laterality: Right;   BREAST EXCISIONAL BIOPSY Right    CARPAL TUNNEL RELEASE Bilateral    B, mult times   CATARACT EXTRACTION     bilateral   cervical neck ablation     x 7, C3-C6/3 screws and plate   CESAREAN SECTION     CYSTOCELE REPAIR     CYSTOSCOPY/URETEROSCOPY/HOLMIUM LASER/STENT PLACEMENT Right 09/19/2023   Procedure: CYSTOSCOPY/URETEROSCOPY/BASKETING OF STONES;  Surgeon: Melody Spurling., MD;  Location: [facility];  Service: Urology;  Laterality: Right;   DILATION AND CURETTAGE OF UTERUS     duptyren's contracture right hand     FINGER ARTHRODESIS Right 05/06/2015   Procedure: RIGHT RING PROXIMAL INTERPHALANGEAL FUSION (PIP);  Surgeon: Brunilda Capra, MD;  Location: [facility];  Service: Orthopedics;  Laterality: Right;   FINGER GANGLION CYST EXCISION     right   HEMICOLECTOMY Right    JOINT REPLACEMENT     right and left basal joints of thumbs   left finger fusion     3 fingers on left hand/one finger right   left ovary and tube removed     NASAL POLYP SURGERY     4 sinus surgeries   NASAL SEPTUM SURGERY     PANNICULECTOMY     PROXIMAL INTERPHALANGEAL FUSION (PIP) Left 09/09/2013   Procedure: FUSION LEFT INDEX PROXIMAL INTERPHALANGEAL JOINT (PIP);  Surgeon: Amelie Baize., MD;  Location: [facility];  Service: Orthopedics;  Laterality: Left;   right median nerve decompression     SHOULDER ARTHROSCOPY     x2 left, 1 right   sinus surgeries     x 4   squamous lesions removed     neck and face   STERIOD INJECTION Right 05/06/2015   Procedure: STEROID INJECTION;  Surgeon: Brunilda Capra, MD;  Location: [facility];  Service: Orthopedics;  Laterality: Right;  Right Index Finger Proximal InterPhalangeal Joint Injection   TONSILLECTOMY AND ADENOIDECTOMY     TUBAL LIGATION     vocal polyps removed       Current Outpatient Medications:    albuterol   (PROVENTIL ) (2.5 MG/3ML) 0.083% nebulizer solution, Take 2.5 mg by nebulization every 4 (four) hours as needed for shortness of breath or wheezing., Disp: , Rfl:    amLODipine  (NORVASC ) 10 MG tablet, TAKE 1 TABLET(10 MG) BY MOUTH DAILY, Disp: 90 tablet, Rfl: 3   arformoterol  (BROVANA ) 15 MCG/2ML NEBU, Take 15 mcg by nebulization See admin instructions. Inhale two to three times daily., Disp: , Rfl:    atorvastatin  (LIPITOR) 40 MG tablet, TAKE 1 TABLET BY MOUTH EVERY NIGHT AT BEDTIME, Disp: 90 tablet, Rfl: 3   budesonide  (PULMICORT ) 0.25 MG/2ML nebulizer solution, Take 0.25 mg by nebulization See admin instructions. Inhale to two to three times daily., Disp: , Rfl:    CALCIUM  PO, Take 600 mg by mouth daily., Disp: ,  Rfl:    carbamazepine  (TEGRETOL  XR) 100 MG 12 hr tablet, Take 200 mg by mouth at bedtime., Disp: , Rfl:    Chlorpheniramine Maleate (ALLERGY PO), Take 2 tablets by mouth at bedtime. Takes 2 [place] Allergy Pills at night, Disp: , Rfl:    Cholecalciferol 25 MCG (1000 UT) capsule, Take 1,000 Units by mouth daily., Disp: , Rfl:    Docusate Sodium  (COLACE PO), Take 1 tablet by mouth daily., Disp: , Rfl:    gabapentin  (NEURONTIN ) 300 MG capsule, Take 300 mg by mouth at bedtime., Disp: , Rfl:    levothyroxine  (SYNTHROID ) 50 MCG tablet, TAKE 1 TABLET(50 MCG) BY MOUTH DAILY BEFORE BREAKFAST (Patient taking differently: Take 50 mcg by mouth daily.), Disp: 90 tablet, Rfl: 3   mometasone  (NASONEX ) 50 MCG/ACT nasal spray, Place 2 sprays into the nose in the morning and at bedtime., Disp: , Rfl:    montelukast  (SINGULAIR ) 10 MG tablet, TAKE 1 TABLET(10 MG) BY MOUTH AT BEDTIME, Disp: 90 tablet, Rfl: 3   NONFORMULARY OR COMPOUNDED ITEM, Take 1 Inhalation by mouth daily. Mometasone /levofloxacin compounded inhalation, Disp: , Rfl:    ondansetron  (ZOFRAN ) 4 MG tablet, Take 4 mg by mouth every 6 (six) hours., Disp: , Rfl:    PHENobarbital  (LUMINAL) 32.4 MG tablet, TAKE 1 TABLET BY MOUTH AT 6 AM AND 2  TABLETS BY MOUTH AT 8 PM., Disp: 270 tablet, Rfl: 1   predniSONE  (DELTASONE ) 5 MG tablet, Take 10 mg by mouth daily., Disp: , Rfl:    senna-docusate (SENOKOT-S) 8.6-50 MG tablet, Take 1 tablet by mouth 2 (two) times daily., Disp: 60 tablet, Rfl: 0   VENTOLIN  HFA 108 (90 Base) MCG/ACT inhaler, Inhale 1-2 puffs into the lungs every 4 (four) hours as needed for wheezing or shortness of breath., Disp: , Rfl:   Current Facility-Administered Medications:    Romosozumab -aqqg (EVENITY ) 105 MG/1. injection 210 mg, 210 mg, Subcutaneous, Once, [name], MD   [START ON 11/15/2023] Romosozumab -aqqg (EVENITY ) 105 MG/1. injection 210 mg, 210 mg, Subcutaneous, Q30 days, [name], MD  Allergies  Allergen Reactions   Iodinated Contrast Media Anaphylaxis    Cardiac arrest   Iodine Other (See Comments)    Cardiac arrest As young adult, received iodinated contrast for IVP, went into cardiac arrest, was resuscitated and hospitalized. Has not received iodinated contrast since that episode. Reports no adverse reaction to betadine.    Metrizamide Other (See Comments)    Cardiac arrest     Morphine Other (See Comments)    Cardiac arrest Patient has previously tolerated hydrocodone, hydromorphone  and fentanyl     Morphine And Codeine Other (See Comments)    Cardiac arrest   Cocklebur    Lamb[place] Qu[place]    Mugwort    Sheep Sorrel    Spiny Pigweed (Amaranthus Spinosus) Skin Test    Tramadol  Other (See Comments)    Causing severe constipation    ROS neg/noncontributory except as noted HPI/below      Objective:      BP 117/64   Pulse 85   Temp 97.7 F (36.5 C) (Temporal)   Resp 18   Ht 5\' 2"  (1.575 m)   Wt 131 lb 4 oz (59.5 kg)   SpO2 97%   BMI 24.01 kg/m  Wt Readings from Last 3 Encounters:  10/16/23 131 lb 4 oz (59.5 kg)  09/22/23 133 lb (60.3 kg)  09/19/23 128 lb (58.1 kg)    Physical Exam   Gen: WDWN NAD HEENT: NCAT, conjunctiva not  injected, sclera  nonicteric NECK:  supple, no thyromegaly, no nodes, no carotid bruits CARDIAC: RRR, S1S2+, no murmur. DP 2+B LUNGS: CTAB. No wheezes ABDOMEN:  BS+, soft, NTND, No HSM, no masses EXT:  no edema MSK: no gross abnormalities. walker NEURO: A&O x3.  CN II-XII intact.  PSYCH: normal mood. Good eye contact  Reviewed hosp records    Assessment & Plan:  Other fatigue -     CBC with Differential/Platelet -     Comprehensive metabolic panel with GFR -     TSH -     Phenobarbital  level  Chronic constipation  Age-related osteoporosis without current pathological fracture -     Romosozumab -aqqg  Assessment and Plan Assessment & Plan Chronic constipation   She experienced chronic constipation with a recent bowel ileus that required hospitalization. Her bowel movements are currently irregular with small, hard stools, but there has been recent improvement with Miralax  and stool softeners. Scar tissue or anatomical issues may contribute to her constipation. Increase Miralax  to twice daily to improve bowel regularity. Monitor bowel movements and adjust Miralax  dosage if diarrhea occurs. Continue stool softeners and encourage increased fluid intake, using diluted Twist drink with Miralax . Monitor for signs of bowel obstruction.  Neck pain due to NMO   Chronic neck pain has worsened post-hospitalization, likely due to immobility and existing NMO. Pain is managed with Salonpas patches and Tylenol . She has an upcoming neurology appointment for further evaluation. Continue Salonpas patches for pain management and administer Tylenol  as needed. Follow up with the neurologist at the end of the month.  Fatigue   She reports increased fatigue over the past 6-8 weeks. Possible contributing factors include recent anesthesia, chronic pain, and medication side effects. Chronic fatigue syndrome is noted, and there is discussion about potential lingering effects of anesthesia due to age. Order blood work to rule out  anemia and other potential causes of fatigue. Check phenobarbital  levels to ensure they are within the therapeutic range.  Phenobarbital  overdose   She has a history of phenobarbital  overdose, with current management including regular monitoring of levels. No recent overdose events have been reported. Check phenobarbital  levels to ensure they are within the therapeutic range.  Kidney stones   She recently had a kidney stone removed, with post-procedural complications including urinary retention and bloating. There are no current symptoms of kidney stones. Discussion suggests potential bowel issues may contribute to bloating rather than kidney stones.    Return if symptoms worsen or fail to improve.  Ellsworth Haas, MD

## 2023-10-16 NOTE — Telephone Encounter (Signed)
 [name]

## 2023-10-16 NOTE — Telephone Encounter (Signed)
 Pt ready for scheduling for EVENITY  on or after : 11/15/23  Option# 1 Buy/Bill (Office supplied medication)  Out-of-pocket cost due at time of  office visit: $0  Number of injection/visits approved: ---  Primary: MEDICARE Evenity  co-insurance: 0% Admin fee co-insurance: 0%  Secondary: [employer]-MED[employer]P [place]  co-insurance:  Admin fee co-insurance:   Medical Benefit Details: Date Benefits were checked: 10/16/23 Deductible: $257 Met of $257 Required/ Coinsurance: 0%/ Admin Fee: 0%  Prior Auth: N/A PA# Expiration Date:   # of doses approved: ------------------------------------------------------------------------- Option# 2- Med Obtained from pharmacy  Pharmacy benefit: Copay $--- (Paid to pharmacy) Admin Fee: --- (Pay at clinic)  Prior Auth: --- PA# Expiration Date:   # of doses approved:  If patient wants fill through the pharmacy benefit please send prescription to: ---, and include estimated need by date in rx notes. Pharmacy will ship medication directly to the office.  Patient NOT eligible for Evenity  Copay Card. Copay Card can make patient's cost as little as $25. Link to apply: [email or web address]   This summary of benefits is an estimation of the patient's out-of-pocket cost. Exact cost may very based on individual plan coverage.

## 2023-10-16 NOTE — Patient Instructions (Signed)
 Miralax  twice/day

## 2023-10-16 NOTE — Telephone Encounter (Signed)
 Evenity  VOB initiated via [email or web address]  Last Evenity  inj: 10/16/23 Next Evenity  inj DUE: 11/15/23

## 2023-10-17 ENCOUNTER — Ambulatory Visit: Payer: Self-pay | Admitting: Family Medicine

## 2023-10-17 LAB — CBC WITH DIFFERENTIAL/PLATELET
Basophils Absolute: 0 10*3/uL (ref 0.0–0.1)
Basophils Relative: 0.7 % (ref 0.0–3.0)
Eosinophils Absolute: 0 10*3/uL (ref 0.0–0.7)
Eosinophils Relative: 0.4 % (ref 0.0–5.0)
HCT: 42.8 % (ref 36.0–46.0)
Hemoglobin: 14.2 g/dL (ref 12.0–15.0)
Lymphocytes Relative: 29.6 % (ref 12.0–46.0)
Lymphs Abs: 1.2 10*3/uL (ref 0.7–4.0)
MCHC: 33.3 g/dL (ref 30.0–36.0)
MCV: 92.2 fl (ref 78.0–100.0)
Monocytes Absolute: 0.4 10*3/uL (ref 0.1–1.0)
Monocytes Relative: 8.9 % (ref 3.0–12.0)
Neutro Abs: 2.5 10*3/uL (ref 1.4–7.7)
Neutrophils Relative %: 60.4 % (ref 43.0–77.0)
Platelets: 304 10*3/uL (ref 150.0–400.0)
RBC: 4.64 Mil/uL (ref 3.87–5.11)
RDW: 13.8 % (ref 11.5–15.5)
WBC: 4.2 10*3/uL (ref 4.0–10.5)

## 2023-10-17 LAB — COMPREHENSIVE METABOLIC PANEL WITH GFR
ALT: 23 U/L (ref 0–35)
AST: 27 U/L (ref 0–37)
Albumin: 4.1 g/dL (ref 3.5–5.2)
Alkaline Phosphatase: 96 U/L (ref 39–117)
BUN: 29 mg/dL — ABNORMAL HIGH (ref 6–23)
CO2: 28 meq/L (ref 19–32)
Calcium: 9 mg/dL (ref 8.4–10.5)
Chloride: 107 meq/L (ref 96–112)
Creatinine, Ser: 0.99 mg/dL (ref 0.40–1.20)
GFR: 50.69 mL/min — ABNORMAL LOW (ref >60.00)
Glucose, Bld: 116 mg/dL — ABNORMAL HIGH (ref 70–99)
Potassium: 4.7 meq/L (ref 3.5–5.1)
Sodium: 141 meq/L (ref 135–145)
Total Bilirubin: 0.3 mg/dL (ref 0.2–1.2)
Total Protein: 7.1 g/dL (ref 6.0–8.3)

## 2023-10-17 LAB — TSH: TSH: 0.82 u[IU]/mL (ref 0.35–5.50)

## 2023-10-17 NOTE — Progress Notes (Signed)
 Labs look ok.  Needs to drink more water

## 2023-10-18 LAB — PHENOBARBITAL LEVEL: Phenobarbital, Serum: 25.3 mg/L (ref 15.0–40.0)

## 2023-10-18 NOTE — Progress Notes (Signed)
 Phenobarb level normal

## 2023-10-19 ENCOUNTER — Inpatient Hospital Stay: Attending: Hematology & Oncology

## 2023-10-19 ENCOUNTER — Inpatient Hospital Stay (HOSPITAL_BASED_OUTPATIENT_CLINIC_OR_DEPARTMENT_OTHER): Admitting: Family

## 2023-10-19 ENCOUNTER — Telehealth: Payer: Self-pay | Admitting: *Deleted

## 2023-10-19 VITALS — BP 115/78 | HR 88 | Temp 97.8°F | Resp 18 | Ht 62.0 in | Wt 131.0 lb

## 2023-10-19 DIAGNOSIS — D631 Anemia in chronic kidney disease: Secondary | ICD-10-CM

## 2023-10-19 DIAGNOSIS — K3184 Gastroparesis: Secondary | ICD-10-CM | POA: Insufficient documentation

## 2023-10-19 DIAGNOSIS — R0602 Shortness of breath: Secondary | ICD-10-CM | POA: Insufficient documentation

## 2023-10-19 DIAGNOSIS — R42 Dizziness and giddiness: Secondary | ICD-10-CM | POA: Diagnosis not present

## 2023-10-19 DIAGNOSIS — G4710 Hypersomnia, unspecified: Secondary | ICD-10-CM

## 2023-10-19 DIAGNOSIS — E039 Hypothyroidism, unspecified: Secondary | ICD-10-CM

## 2023-10-19 DIAGNOSIS — D751 Secondary polycythemia: Secondary | ICD-10-CM | POA: Diagnosis not present

## 2023-10-19 DIAGNOSIS — D582 Other hemoglobinopathies: Secondary | ICD-10-CM

## 2023-10-19 DIAGNOSIS — F413 Other mixed anxiety disorders: Secondary | ICD-10-CM

## 2023-10-19 DIAGNOSIS — I2693 Single subsegmental pulmonary embolism without acute cor pulmonale: Secondary | ICD-10-CM

## 2023-10-19 LAB — CMP (CANCER CENTER ONLY)
ALT: 19 U/L (ref 0–44)
AST: 27 U/L (ref 15–41)
Albumin: 4.4 g/dL (ref 3.5–5.0)
Alkaline Phosphatase: 85 U/L (ref 38–126)
Anion gap: 9 (ref 5–15)
BUN: 23 mg/dL (ref 8–23)
CO2: 25 mmol/L (ref 22–32)
Calcium: 9.8 mg/dL (ref 8.9–10.3)
Chloride: 107 mmol/L (ref 98–111)
Creatinine: 0.98 mg/dL (ref 0.44–1.00)
GFR, Estimated: 55 mL/min — ABNORMAL LOW (ref >60)
Glucose, Bld: 95 mg/dL (ref 70–99)
Potassium: 4.6 mmol/L (ref 3.5–5.1)
Sodium: 141 mmol/L (ref 135–145)
Total Bilirubin: 0.4 mg/dL (ref 0.0–1.2)
Total Protein: 7.1 g/dL (ref 6.5–8.1)

## 2023-10-19 LAB — RETICULOCYTES
Immature Retic Fract: 6.3 % (ref 2.3–15.9)
RBC.: 4.63 MIL/uL (ref 3.87–5.11)
Retic Count, Absolute: 61.6 10*3/uL (ref 19.0–186.0)
Retic Ct Pct: 1.3 % (ref 0.4–3.1)

## 2023-10-19 LAB — CBC WITH DIFFERENTIAL (CANCER CENTER ONLY)
Abs Immature Granulocytes: 0.03 10*3/uL (ref 0.00–0.07)
Basophils Absolute: 0.1 10*3/uL (ref 0.0–0.1)
Basophils Relative: 1 %
Eosinophils Absolute: 0 10*3/uL (ref 0.0–0.5)
Eosinophils Relative: 1 %
HCT: 42.8 % (ref 36.0–46.0)
Hemoglobin: 14 g/dL (ref 12.0–15.0)
Immature Granulocytes: 1 %
Lymphocytes Relative: 36 %
Lymphs Abs: 1.7 10*3/uL (ref 0.7–4.0)
MCH: 30.2 pg (ref 26.0–34.0)
MCHC: 32.7 g/dL (ref 30.0–36.0)
MCV: 92.4 fL (ref 80.0–100.0)
Monocytes Absolute: 0.4 10*3/uL (ref 0.1–1.0)
Monocytes Relative: 7 %
Neutro Abs: 2.6 10*3/uL (ref 1.7–7.7)
Neutrophils Relative %: 54 %
Platelet Count: 254 10*3/uL (ref 150–400)
RBC: 4.63 MIL/uL (ref 3.87–5.11)
RDW: 13.4 % (ref 11.5–15.5)
WBC Count: 4.8 10*3/uL (ref 4.0–10.5)
nRBC: 0 % (ref 0.0–0.2)

## 2023-10-19 LAB — LACTATE DEHYDROGENASE: LDH: 181 U/L (ref 98–192)

## 2023-10-19 LAB — FERRITIN: Ferritin: 113 ng/mL (ref 11–307)

## 2023-10-19 NOTE — Telephone Encounter (Signed)
 Copied from CRM [id number]. Topic: Clinical - Lab/Test Results >> Oct 19, 2023 11:34 AM [name] wrote: Reason for CRM: Pt husband called to go over lab results- pt husband understood, stated also that pt does not have the urge to go to bathroom, drinks 30 gal of water but will have pt go every 2 hours.

## 2023-10-19 NOTE — Progress Notes (Signed)
 Hematology and Oncology Follow Up Visit  [name] [id number] 1932/11/15 89 y.o. 10/19/2023   Principle Diagnosis:  Transient erythrocytosis - JAK2 (-)   Current Therapy:        Observation   Interim History:  [name] is here today with her husband for follow-up. She is doing fairly well despite the chronic fatigue.  She has recuperated nicely since her hospitalization for bowel ileus last month.  She is currently on Miralax  and stool softeners daily which have helped.  No blood loss noted. No abnormal bruising, no petechiae.  No fever, chills, n/v, cough, rash, chest pain, palpitations, abdominal pain or changes in bladder habits at this time.  She has occasional episodes of dizziness as well as SOB with over exertion. This resolves with taking a break to rest.  She has neuropathy in both lower extremities and intermittently in her hands.  She will sometimes have mechanical falls at home but thankfully states that she has not been injured. No syncope.  Appetite and hydration are good. Weight is stable at 131 lbs.   ECOG Performance Status: 1 - Symptomatic but completely ambulatory  Medications:  Allergies as of 10/19/2023       Reactions   Iodinated Contrast Media Anaphylaxis   Cardiac arrest   Iodine Other (See Comments)   Cardiac arrest As young adult, received iodinated contrast for IVP, went into cardiac arrest, was resuscitated and hospitalized. Has not received iodinated contrast since that episode. Reports no adverse reaction to betadine.    Metrizamide Other (See Comments)   Cardiac arrest   Morphine Other (See Comments)   Cardiac arrest Patient has previously tolerated hydrocodone, hydromorphone  and fentanyl    Morphine And Codeine Other (See Comments)   Cardiac arrest   Cocklebur    Lambs Quarters    Mugwort    Sheep Sorrel    Spiny Pigweed (amaranthus Spinosus) Skin Test    Tramadol  Other (See Comments)   Causing severe constipation          Medication List        Accurate as of Oct 19, 2023  3:33 PM. If you have any questions, ask your nurse or doctor.          STOP taking these medications    Stool Softener/Laxative 50-8.6 MG tablet Generic drug: senna-docusate Stopped by: Kennard Pea       TAKE these medications    ALLERGY PO Take 2 tablets by mouth at bedtime. Takes 2 [facility] Allergy Pills at night   amLODipine  10 MG tablet Commonly known as: NORVASC  TAKE 1 TABLET(10 MG) BY MOUTH DAILY   arformoterol  15 MCG/2ML Nebu Commonly known as: BROVANA  Take 15 mcg by nebulization See admin instructions. Inhale two to three times daily.   atorvastatin  40 MG tablet Commonly known as: LIPITOR TAKE 1 TABLET BY MOUTH EVERY NIGHT AT BEDTIME   budesonide  0.25 MG/2ML nebulizer solution Commonly known as: PULMICORT  Take 0.25 mg by nebulization See admin instructions. Inhale to two to three times daily.   CALCIUM  PO Take 600 mg by mouth daily.   carbamazepine  100 MG 12 hr tablet Commonly known as: TEGRETOL  XR Take 200 mg by mouth at bedtime.   Cholecalciferol 25 MCG (1000 UT) capsule Take 1,000 Units by mouth daily.   COLACE PO Take 1 tablet by mouth daily.   gabapentin  300 MG capsule Commonly known as: NEURONTIN  Take 300 mg by mouth at bedtime.   levothyroxine  50 MCG tablet Commonly known as: SYNTHROID  TAKE 1  TABLET(50 MCG) BY MOUTH DAILY BEFORE BREAKFAST What changed: See the new instructions.   mometasone  50 MCG/ACT nasal spray Commonly known as: NASONEX  Place 2 sprays into the nose in the morning and at bedtime.   montelukast  10 MG tablet Commonly known as: SINGULAIR  TAKE 1 TABLET(10 MG) BY MOUTH AT BEDTIME   NONFORMULARY OR COMPOUNDED ITEM Take 1 Inhalation by mouth daily. Mometasone /levofloxacin compounded inhalation   ondansetron  4 MG tablet Commonly known as: ZOFRAN  Take 4 mg by mouth every 6 (six) hours.   PHENobarbital  32.4 MG tablet Commonly known as: LUMINAL TAKE 1 TABLET BY  MOUTH AT 6 AM AND 2 TABLETS BY MOUTH AT 8 PM.   predniSONE  5 MG tablet Commonly known as: DELTASONE  Take 10 mg by mouth daily.   albuterol  (2.5 MG/3ML) 0.083% nebulizer solution Commonly known as: PROVENTIL  Take 2.5 mg by nebulization every 4 (four) hours as needed for shortness of breath or wheezing.   Ventolin  HFA 108 (90 Base) MCG/ACT inhaler Generic drug: albuterol  Inhale 1-2 puffs into the lungs every 4 (four) hours as needed for wheezing or shortness of breath.        Allergies:  Allergies  Allergen Reactions   Iodinated Contrast Media Anaphylaxis    Cardiac arrest   Iodine Other (See Comments)    Cardiac arrest As young adult, received iodinated contrast for IVP, went into cardiac arrest, was resuscitated and hospitalized. Has not received iodinated contrast since that episode. Reports no adverse reaction to betadine.    Metrizamide Other (See Comments)    Cardiac arrest     Morphine Other (See Comments)    Cardiac arrest Patient has previously tolerated hydrocodone, hydromorphone  and fentanyl     Morphine And Codeine Other (See Comments)    Cardiac arrest   Cocklebur    Lambs Quarters    Mugwort    Sheep Sorrel    Spiny Pigweed (Amaranthus Spinosus) Skin Test    Tramadol  Other (See Comments)    Causing severe constipation     Past Medical History, Surgical history, Social history, and Family History were reviewed and updated.  Review of Systems: All other 10 point review of systems is negative.   Physical Exam:  vitals were not taken for this visit.   Wt Readings from Last 3 Encounters:  10/16/23 131 lb 4 oz (59.5 kg)  09/22/23 133 lb (60.3 kg)  09/19/23 128 lb (58.1 kg)    Ocular: Sclerae unicteric, pupils equal, round and reactive to light Ear-nose-throat: Oropharynx clear, dentition fair Lymphatic: No cervical or supraclavicular adenopathy Lungs no rales or rhonchi, good excursion bilaterally Heart regular rate and rhythm, no murmur  appreciated Abd soft, nontender, positive bowel sounds MSK no focal spinal tenderness, no joint edema Neuro: non-focal, well-oriented, appropriate affect Breasts: Deferred   Lab Results  Component Value Date   WBC 4.2 10/16/2023   HGB 14.2 10/16/2023   HCT 42.8 10/16/2023   MCV 92.2 10/16/2023   PLT 304.0 10/16/2023   Lab Results  Component Value Date   FERRITIN 54 08/09/2023   IRON 94 08/09/2023   TIBC 302 08/09/2023   UIBC 208 08/09/2023   IRONPCTSAT 31 08/09/2023   Lab Results  Component Value Date   RETICCTPCT 1.4 08/09/2023   RBC 4.64 10/16/2023   RETICCTABS 37.4 01/30/2013   No results found for: "KPAFRELGTCHN", "LAMBDASER", "KAPLAMBRATIO" No results found for: "IGGSERUM", "IGA", "IGMSERUM" Lab Results  Component Value Date   TOTALPROTELP 6.8 01/30/2013   ALBUMINELP 57.5 01/30/2013   A1GS 5.0 (H)  01/30/2013   A2GS 12.4 (H) 01/30/2013   BETS 6.2 01/30/2013   BETA2SER 5.4 01/30/2013   GAMS 13.5 01/30/2013   MSPIKE NOT DET 01/30/2013   SPEI * 01/30/2013     Chemistry      Component Value Date/Time   NA 141 10/16/2023 1507   K 4.7 10/16/2023 1507   CL 107 10/16/2023 1507   CO2 28 10/16/2023 1507   BUN 29 (H) 10/16/2023 1507   CREATININE 0.99 10/16/2023 1507   CREATININE 0.93 08/09/2023 1129   CREATININE 0.78 06/27/2012 1139      Component Value Date/Time   CALCIUM  9.0 10/16/2023 1507   ALKPHOS 96 10/16/2023 1507   AST 27 10/16/2023 1507   AST 33 08/09/2023 1129   ALT 23 10/16/2023 1507   ALT 27 08/09/2023 1129   BILITOT 0.3 10/16/2023 1507   BILITOT 0.4 08/09/2023 1129       Impression and Plan: Ms. Tatham is a very pleasant 89 yo female with transient erythrocytosis.  So far, her testing really has been negative for any thing that looks like a myeloproliferative disorder.  Her counts at this time are within normal limits.  Follow-up in 3 months.   Kennard Pea, NP 5/23/20253:33 PM

## 2023-10-23 LAB — IRON AND IRON BINDING CAPACITY (CC-WL,HP ONLY)
Iron: 103 ug/dL (ref 28–170)
Saturation Ratios: 30 % (ref 10.4–31.8)
TIBC: 339 ug/dL (ref 250–450)
UIBC: 236 ug/dL (ref 148–442)

## 2023-10-25 DIAGNOSIS — M4802 Spinal stenosis, cervical region: Secondary | ICD-10-CM | POA: Diagnosis not present

## 2023-10-25 DIAGNOSIS — I340 Nonrheumatic mitral (valve) insufficiency: Secondary | ICD-10-CM | POA: Diagnosis not present

## 2023-10-25 DIAGNOSIS — R252 Cramp and spasm: Secondary | ICD-10-CM | POA: Diagnosis not present

## 2023-10-25 DIAGNOSIS — Z87891 Personal history of nicotine dependence: Secondary | ICD-10-CM | POA: Diagnosis not present

## 2023-10-25 DIAGNOSIS — G629 Polyneuropathy, unspecified: Secondary | ICD-10-CM | POA: Diagnosis not present

## 2023-10-25 DIAGNOSIS — M349 Systemic sclerosis, unspecified: Secondary | ICD-10-CM | POA: Diagnosis not present

## 2023-10-26 ENCOUNTER — Ambulatory Visit: Admitting: Gastroenterology

## 2023-10-26 NOTE — Progress Notes (Deleted)
 Chief Complaint: Hospital follow-up Primary GI MD: Dr. Bridgett Camps  HPI: 89 year old female history of large right-sided colon polyp s/p right hemicolectomy 2013, chronic constipation pelvic floor dysfunction, GERD, gastroparesis, fundic gland polyp with low-grade dysplasia s/p resection, COPD, hypertension, hyperlipidemia, polyarthritis, presents for hospital follow-up   Discussed the use of AI scribe software for clinical note transcription with the patient, who gave verbal consent to proceed.  History of Present Illness      PREVIOUS GI WORKUP   CT abdomen pelvis without contrast 09/22/2023 IMPRESSION: 1. No acute or focal lesion to explain the patient's symptoms. 2. Partial ascending colectomy. The anastomosis is intact. 3. Moderate stool throughout the ascending and proximal transverse colon. 4. Bilateral nonobstructing nephrolithiasis. 5. 6.3 cm cyst at the lower pole of the left kidney demonstrates minimal mural calcifications. This is stable in size. No follow-up imaging recommended. 6. Coronary artery disease. 7. Presacral soft tissue likely reflects a post inflammatory state. Malignancy is considered unlikely discussed.  Colonoscopy 2015 1. Normal mucosa in the terminal ileum  2. There was evidence of prior surgical anastomosis in the left colon  3. 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   chronic sinusitis 08/08/2012   Colon polyp    Constipation    COPD (chronic obstructive pulmonary disease) (HCC)    Cystocele 09/16/2012   GERD (gastroesophageal reflux disease)    History of kidney stones    Hyperlipidemia    Hypertension    Hypothyroid    Neurogenic  bladder 2013   husband caths pt, manages with timed void   Osteoporosis    Pulmonary embolism (HCC)    1980s   Raynaud phenomenon    Renal cyst    Non-complex, contrast MRI 2013 with L>R non-complex cysts, dominant 3.7 left lower pole   Seizures (HCC)    1990 last seizures on meds Phenobarb   Shingles late 90's   Skin cancer    squamous cell   Thyroid  nodule    Right thyroid  lobe, only seen on Sagittal imaging measures 2.3 cm in craniocaudal dimension and appears stable   Tubular adenoma    Vitamin D  deficiency     Past Surgical History:  Procedure Laterality Date   ABDOMINAL HYSTERECTOMY     ANTERIOR FUSION CLIVUS-C2 EXTRAORAL W/ ODONTOID EXCISION  12/2012   APPENDECTOMY     ARTHROSCOPIC REPAIR ACL Left 1976   BASAL CELL CARCINOMA EXCISION     face   BLADDER SUSPENSION     BREAST DUCTAL SYSTEM EXCISION Right 01/15/2014   Procedure: EXCISION DUCTAL SYSTEM RIGHT BREAST;  Surgeon: Lockie Rima, MD;  Location: [facility];  Service: General;  Laterality: Right;   BREAST EXCISIONAL BIOPSY Right    CARPAL TUNNEL RELEASE Bilateral    B, mult times   CATARACT EXTRACTION     bilateral   cervical neck ablation     x 7, C3-C6/3 screws and plate   CESAREAN SECTION     CYSTOCELE REPAIR     CYSTOSCOPY/URETEROSCOPY/HOLMIUM LASER/STENT PLACEMENT Right 09/19/2023   Procedure: CYSTOSCOPY/URETEROSCOPY/BASKETING OF STONES;  Surgeon: Melody Spurling., MD;  Location: [facility];  Service: Urology;  Laterality: Right;   DILATION AND CURETTAGE OF UTERUS     duptyren's contracture right hand     FINGER ARTHRODESIS Right 05/06/2015   Procedure: RIGHT RING PROXIMAL INTERPHALANGEAL FUSION (PIP);  Surgeon: Brunilda Capra, MD;  Location: [facility];  Service: Orthopedics;  Laterality: Right;   FINGER GANGLION CYST EXCISION     right   HEMICOLECTOMY Right    JOINT REPLACEMENT     right and left basal joints of thumbs   left finger fusion     3 fingers on left hand/one  finger right   left ovary and tube removed     NASAL POLYP SURGERY     4 sinus surgeries   NASAL SEPTUM SURGERY     PANNICULECTOMY     PROXIMAL INTERPHALANGEAL FUSION (PIP) Left 09/09/2013   Procedure: FUSION LEFT INDEX PROXIMAL INTERPHALANGEAL JOINT (PIP);  Surgeon: Amelie Baize., MD;  Location: [facility];  Service: Orthopedics;  Laterality: Left;   right median nerve decompression     SHOULDER ARTHROSCOPY     x2 left, 1 right   sinus surgeries     x 4   squamous lesions removed     neck and face   STERIOD INJECTION Right 05/06/2015   Procedure: STEROID INJECTION;  Surgeon: Brunilda Capra, MD;  Location: [facility];  Service: Orthopedics;  Laterality: Right;  Right Index Finger Proximal InterPhalangeal Joint Injection   TONSILLECTOMY AND ADENOIDECTOMY     TUBAL LIGATION     vocal polyps removed      Current Outpatient Medications  Medication Sig Dispense Refill   albuterol  (PROVENTIL ) (2.5 MG/3ML) 0.083% nebulizer solution Take 2.5 mg by nebulization every 4 (four) hours as needed for shortness of breath or wheezing.     amLODipine  (NORVASC ) 10 MG tablet TAKE 1 TABLET(10 MG) BY MOUTH DAILY 90 tablet 3   arformoterol  (BROVANA ) 15 MCG/2ML NEBU Take 15 mcg by nebulization See admin instructions. Inhale two to three times daily.     atorvastatin  (LIPITOR) 40 MG tablet TAKE 1 TABLET BY MOUTH EVERY NIGHT AT BEDTIME 90 tablet 3   budesonide  (PULMICORT ) 0.25 MG/2ML nebulizer solution Take 0.25 mg by nebulization See admin instructions. Inhale to two to three times daily.     CALCIUM  PO Take 600 mg by mouth daily.     carbamazepine  (TEGRETOL  XR) 100 MG 12 hr tablet Take 200 mg by mouth at bedtime.     Chlorpheniramine Maleate (ALLERGY PO) Take 2 tablets by mouth at bedtime. Takes 2 [facility] Allergy Pills at night     Cholecalciferol 25 MCG (1000 UT) capsule Take 1,000 Units by mouth daily.     Docusate Sodium  (COLACE PO) Take 1 tablet by mouth daily.      gabapentin  (NEURONTIN ) 300 MG capsule Take 300 mg by mouth at bedtime.     levothyroxine  (SYNTHROID ) 50 MCG tablet TAKE 1 TABLET(50 MCG) BY MOUTH DAILY BEFORE BREAKFAST (Patient taking differently: Take 50 mcg by mouth daily.) 90 tablet 3   mometasone  (NASONEX ) 50 MCG/ACT nasal spray Place 2 sprays into the nose in the morning and at bedtime.     montelukast  (SINGULAIR ) 10 MG tablet TAKE 1 TABLET(10 MG) BY MOUTH AT BEDTIME 90 tablet 3   NONFORMULARY OR COMPOUNDED ITEM Take 1 Inhalation by mouth daily. Mometasone /levofloxacin compounded inhalation     ondansetron  (ZOFRAN ) 4 MG tablet Take 4 mg by mouth every 6 (six) hours.     PHENobarbital  (LUMINAL) 32.4 MG tablet  TAKE 1 TABLET BY MOUTH AT 6 AM AND 2 TABLETS BY MOUTH AT 8 PM. 270 tablet 1   predniSONE  (DELTASONE ) 5 MG tablet Take 10 mg by mouth daily.     VENTOLIN  HFA 108 (90 Base) MCG/ACT inhaler Inhale 1-2 puffs into the lungs every 4 (four) hours as needed for wheezing or shortness of breath.     Current Facility-Administered Medications  Medication Dose Route Frequency Provider Last Rate Last Admin   Romosozumab -aqqg (EVENITY ) 105 MG/1. injection 210 mg  210 mg Subcutaneous Once Kulik, Ann Marie, MD       [START ON 11/15/2023] Romosozumab -aqqg (EVENITY ) 105 MG/1. injection 210 mg  210 mg Subcutaneous Q30 days [name], MD        Allergies as of 10/26/2023 - Review Complete 10/19/2023  Allergen Reaction Noted   Iodinated contrast media Anaphylaxis 06/18/2015   Iodine Other (See Comments) 11/01/2012   Metrizamide Other (See Comments) 06/18/2015   Morphine Other (See Comments) 11/01/2012   Morphine and codeine Other (See Comments)    Cocklebur  12/27/2021   Lambs quarters  12/27/2021   Mugwort  12/27/2021   Sheep sorrel  12/27/2021   Spiny pigweed (amaranthus spinosus) skin test  12/27/2021   Tramadol  Other (See Comments) 09/22/2023    Family History  Problem Relation Age of Onset   Heart disease Mother     Hyperlipidemia Mother        M, aunt   Colon cancer Father        54   Ovarian cancer Sister 70 - 76   Lung cancer Sister        lung - stage 1   Thyroid  cancer Sister    Skin cancer Daughter    Malignant hyperthermia Cousin    Stomach cancer Other        first cousin   Breast cancer Neg Hx    Esophageal cancer Neg Hx    Pancreatic cancer Neg Hx     Social History   Socioeconomic History   Marital status: Married    Spouse name: Not on file   Number of children: 3   Years of education: Not on file   Highest education level: Not on file  Occupational History   Occupation: retired, Education administrator: RETIRED  Tobacco Use   Smoking status: Former    Current packs/day: 0.00    Average packs/day: 0.5 packs/day for 3.0 years (1.5 ttl pk-yrs)    Types: Cigarettes    Start date: 05/30/1975    Quit date: 05/29/1978    Years since quitting: 45.4   Smokeless tobacco: Never   Tobacco comments:    quit smoking 35 years ago  Vaping Use   Vaping status: Never Used  Substance and Sexual Activity   Alcohol  use: No   Drug use: No   Sexual activity: Not Currently  Other Topics Concern   Not on file  Social History Narrative   Pt lives at home with her husband. She is originally from [place]. Moved to [place] in 2005.     3 grown children - Son is a Education officer, community in the area, son in [place] , daughter in [place]   Pt is a retired Engineer, civil (consulting)   Social Drivers of Corporate investment banker Strain: Low Risk  (11/16/2022)   Overall Financial Resource Strain (CARDIA)    Difficulty of Paying Living Expenses: Not hard at all  Food Insecurity: No Food Insecurity (09/23/2023)   Hunger Vital  Sign    Worried About Programme researcher, broadcasting/film/video in the Last Year: Never true    Ran Out of Food in the Last Year: Never true  Transportation Needs: No Transportation Needs (09/23/2023)   PRAPARE - Administrator, Civil Service (Medical): No    Lack of Transportation (Non-Medical): No  Physical Activity:  Inactive (11/16/2022)   Exercise Vital Sign    Days of Exercise per Week: 0 days    Minutes of Exercise per Session: 0 min  Stress: No Stress Concern Present (11/16/2022)   [employer] of Occupational Health - Occupational Stress Questionnaire    Feeling of Stress : Not at all  Social Connections: Socially Integrated (09/22/2023)   Social Connection and Isolation Panel [NHANES]    Frequency of Communication with Friends and Family: More than three times a week    Frequency of Social Gatherings with Friends and Family: More than three times a week    Attends Religious Services: 1 to 4 times per year    Active Member of [employer] or Organizations: Yes    Attends Banker Meetings: 1 to 4 times per year    Marital Status: Married  Catering manager Violence: Not At Risk (09/23/2023)   Humiliation, Afraid, Rape, and Kick questionnaire    Fear of Current or Ex-Partner: No    Emotionally Abused: No    Physically Abused: No    Sexually Abused: No    Review of Systems:    Constitutional: No weight loss, fever, chills, weakness or fatigue HEENT: Eyes: No change in vision               Ears, Nose, Throat:  No change in hearing or congestion Skin: No rash or itching Cardiovascular: No chest pain, chest pressure or palpitations   Respiratory: No SOB or cough Gastrointestinal: See HPI and otherwise negative Genitourinary: No dysuria or change in urinary frequency Neurological: No headache, dizziness or syncope Musculoskeletal: No new muscle or joint pain Hematologic: No bleeding or bruising Psychiatric: No history of depression or anxiety    Physical Exam:  Vital signs: There were no vitals taken for this visit.  Constitutional: NAD, alert and cooperative Head:  Normocephalic and atraumatic. Eyes:   PEERL, EOMI. No icterus. Conjunctiva pink. Respiratory: Respirations even and unlabored. Lungs clear to auscultation bilaterally.   No wheezes, crackles, or rhonchi.   Cardiovascular:  Regular rate and rhythm. No peripheral edema, cyanosis or pallor.  Gastrointestinal:  Soft, nondistended, nontender. No rebound or guarding. Normal bowel sounds. No appreciable masses or hepatomegaly. Rectal:  Declines Msk:  Symmetrical without gross deformities. Without edema, no deformity or joint abnormality.  Neurologic:  Alert and  oriented x4;  grossly normal neurologically.  Skin:   Dry and intact without significant lesions or rashes. Psychiatric: Oriented to person, place and time. Demonstrates good judgement and reason without abnormal affect or behaviors.  Physical Exam    RELEVANT LABS AND IMAGING: CBC    Component Value Date/Time   WBC 4.8 10/19/2023 1505   WBC 4.2 10/16/2023 1507   RBC 4.63 10/19/2023 1506   RBC 4.63 10/19/2023 1505   HGB 14.0 10/19/2023 1505   HGB 13.5 10/27/2013 1532   HCT 42.8 10/19/2023 1505   HCT 40.2 10/27/2013 1532   PLT 254 10/19/2023 1505   PLT 267 10/27/2013 1532   MCV 92.4 10/19/2023 1505   MCV 90 10/27/2013 1532   MCH 30.2 10/19/2023 1505   MCHC 32.7 10/19/2023 1505  RDW 13.4 10/19/2023 1505   RDW 13.5 10/27/2013 1532   LYMPHSABS 1.7 10/19/2023 1505   LYMPHSABS 2.0 10/27/2013 1532   MONOABS 0.4 10/19/2023 1505   EOSABS 0.0 10/19/2023 1505   EOSABS 0.2 10/27/2013 1532   BASOSABS 0.1 10/19/2023 1505   BASOSABS 0.0 10/27/2013 1532    CMP     Component Value Date/Time   NA 141 10/19/2023 1505   K 4.6 10/19/2023 1505   CL 107 10/19/2023 1505   CO2 25 10/19/2023 1505   GLUCOSE 95 10/19/2023 1505   BUN 23 10/19/2023 1505   CREATININE 0.98 10/19/2023 1505   CREATININE 0.78 06/27/2012 1139   CALCIUM  9.8 10/19/2023 1505   PROT 7.1 10/19/2023 1505   ALBUMIN 4.4 10/19/2023 1505   AST 27 10/19/2023 1505   ALT 19 10/19/2023 1505   ALKPHOS 85 10/19/2023 1505   BILITOT 0.4 10/19/2023 1505   GFRNONAA 55 (L) 10/19/2023 1505   GFRAA >60 05/03/2015 1345     Assessment/Plan:   89 year old female history of  large right-sided colon polyp s/p right hemicolectomy 2013, chronic constipation pelvic floor dysfunction, GERD, gastroparesis, fundic gland polyp with low-grade dysplasia s/p resection, COPD, chronic inflammatory demyelinating polyneuropathy, hypertension, hyperlipidemia, polyarthritis, neurogenic bladder requiring intermittent catheterization, presents for hospital follow-up  Chronic constipation and pelvic floor dysfunction Last seen 2022 by Dr. Bridgett Camps who recommended MiraLAX  and stool softener. Admitted end of April with abdominal pain and CT abdomen showing moderate stool in colon.  Patient was put on aggressive bowel regimen with improvement and discharged home  Nephrolithiasis Had outpatient cystoscopy with right retrograde pyelogram 09/19/23  History of gastric and colonic polyp Last colonoscopy 2015 which was normal.  Further screening deferred due to age  Neurogenic bladder Requiring intermittent catheterization in the past    Shanekqua Schaper [facility] 10/26/2023, 9:15 AM  Cc: [name], MD

## 2023-11-01 DIAGNOSIS — R262 Difficulty in walking, not elsewhere classified: Secondary | ICD-10-CM | POA: Diagnosis not present

## 2023-11-01 DIAGNOSIS — M4802 Spinal stenosis, cervical region: Secondary | ICD-10-CM | POA: Diagnosis not present

## 2023-11-01 DIAGNOSIS — G629 Polyneuropathy, unspecified: Secondary | ICD-10-CM | POA: Diagnosis not present

## 2023-11-05 ENCOUNTER — Emergency Department (HOSPITAL_COMMUNITY)

## 2023-11-05 ENCOUNTER — Other Ambulatory Visit: Payer: Self-pay

## 2023-11-05 ENCOUNTER — Observation Stay (HOSPITAL_COMMUNITY)
Admission: EM | Admit: 2023-11-05 | Discharge: 2023-11-06 | Disposition: A | Discharge status: Home / Self Care | Attending: Emergency Medicine | Admitting: Emergency Medicine

## 2023-11-05 ENCOUNTER — Ambulatory Visit
Admission: EM | Admit: 2023-11-05 | Discharge: 2023-11-05 | Disposition: A | Discharge status: Home / Self Care | Attending: Physician Assistant | Admitting: Physician Assistant

## 2023-11-05 DIAGNOSIS — R079 Chest pain, unspecified: Secondary | ICD-10-CM | POA: Diagnosis not present

## 2023-11-05 DIAGNOSIS — Z85828 Personal history of other malignant neoplasm of skin: Secondary | ICD-10-CM | POA: Insufficient documentation

## 2023-11-05 DIAGNOSIS — R000 Tachycardia, unspecified: Secondary | ICD-10-CM | POA: Diagnosis not present

## 2023-11-05 DIAGNOSIS — E785 Hyperlipidemia, unspecified: Secondary | ICD-10-CM | POA: Diagnosis not present

## 2023-11-05 DIAGNOSIS — I700 Atherosclerosis of aorta: Secondary | ICD-10-CM | POA: Diagnosis not present

## 2023-11-05 DIAGNOSIS — J4489 Other specified chronic obstructive pulmonary disease: Secondary | ICD-10-CM | POA: Diagnosis not present

## 2023-11-05 DIAGNOSIS — I10 Essential (primary) hypertension: Secondary | ICD-10-CM | POA: Diagnosis not present

## 2023-11-05 DIAGNOSIS — Z86718 Personal history of other venous thrombosis and embolism: Secondary | ICD-10-CM | POA: Insufficient documentation

## 2023-11-05 DIAGNOSIS — Z87891 Personal history of nicotine dependence: Secondary | ICD-10-CM | POA: Insufficient documentation

## 2023-11-05 DIAGNOSIS — G6181 Chronic inflammatory demyelinating polyneuritis: Secondary | ICD-10-CM | POA: Insufficient documentation

## 2023-11-05 DIAGNOSIS — E039 Hypothyroidism, unspecified: Secondary | ICD-10-CM | POA: Diagnosis present

## 2023-11-05 DIAGNOSIS — J449 Chronic obstructive pulmonary disease, unspecified: Secondary | ICD-10-CM | POA: Diagnosis present

## 2023-11-05 DIAGNOSIS — R918 Other nonspecific abnormal finding of lung field: Secondary | ICD-10-CM | POA: Diagnosis not present

## 2023-11-05 DIAGNOSIS — R0789 Other chest pain: Principal | ICD-10-CM | POA: Diagnosis present

## 2023-11-05 DIAGNOSIS — Z79899 Other long term (current) drug therapy: Secondary | ICD-10-CM | POA: Diagnosis not present

## 2023-11-05 LAB — BASIC METABOLIC PANEL WITH GFR
Anion gap: 5 (ref 5–15)
BUN: 23 mg/dL (ref 8–23)
CO2: 26 mmol/L (ref 22–32)
Calcium: 8.6 mg/dL — ABNORMAL LOW (ref 8.9–10.3)
Chloride: 107 mmol/L (ref 98–111)
Creatinine, Ser: 0.95 mg/dL (ref 0.44–1.00)
GFR, Estimated: 57 mL/min — ABNORMAL LOW (ref >60)
Glucose, Bld: 107 mg/dL — ABNORMAL HIGH (ref 70–99)
Potassium: 4.2 mmol/L (ref 3.5–5.1)
Sodium: 138 mmol/L (ref 135–145)

## 2023-11-05 LAB — CBC
HCT: 43.6 % (ref 36.0–46.0)
Hemoglobin: 14.7 g/dL (ref 12.0–15.0)
MCH: 31.5 pg (ref 26.0–34.0)
MCHC: 33.7 g/dL (ref 30.0–36.0)
MCV: 93.6 fL (ref 80.0–100.0)
Platelets: 217 10*3/uL (ref 150–400)
RBC: 4.66 MIL/uL (ref 3.87–5.11)
RDW: 13.4 % (ref 11.5–15.5)
WBC: 4.8 10*3/uL (ref 4.0–10.5)
nRBC: 0 % (ref 0.0–0.2)

## 2023-11-05 LAB — TROPONIN I (HIGH SENSITIVITY)
Troponin I (High Sensitivity): 4 ng/L (ref <18)
Troponin I (High Sensitivity): 4 ng/L (ref <18)

## 2023-11-05 MED ORDER — ASPIRIN 81 MG PO CHEW
162.0000 mg | CHEWABLE_TABLET | Freq: Once | ORAL | Status: AC
  Administered 2023-11-05: 162 mg via ORAL

## 2023-11-05 MED ORDER — ACETAMINOPHEN 325 MG PO TABS: 650.0000 mg | ORAL_TABLET | Freq: Four times a day (QID) | ORAL | Status: DC | PRN

## 2023-11-05 NOTE — ED Notes (Signed)
 EMS transport is currently at bedside. Report given by this RN.

## 2023-11-05 NOTE — ED Provider Notes (Signed)
 [name]    CSN: [id number] Arrival date & time: 11/05/23  1426      History   Chief Complaint Chief Complaint  Patient presents with   Chest Pain    HPI [name] is a 89 y.o. female.   HPI Pt presents today with concerns for acute, sharp left sided chest pain that started approx 5-10 minutes prior to arrival  She states she did have similar pain intermittently last night but current pain has been constant once it started She denies SOB or radiation of pain but is having some intermittent flares while in exam     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 (HCC) 03/2011   chronic sinusitis 08/08/2012   Colon polyp    Constipation    COPD (chronic obstructive pulmonary disease) (HCC)    Cystocele 09/16/2012   GERD (gastroesophageal reflux disease)    History of kidney stones    Hyperlipidemia    Hypertension    Hypothyroid    Neurogenic bladder 2013   husband caths pt, manages with timed void   Osteoporosis    Pulmonary embolism (HCC)    1980s   Raynaud phenomenon    Renal cyst    Non-complex, contrast MRI 2013 with L>R non-complex cysts, dominant 3.7 left lower pole   Seizures (HCC)    1990 last seizures on meds Phenobarb   Shingles late 90's   Skin cancer    squamous cell   Thyroid  nodule    Right thyroid  lobe, only seen on Sagittal imaging measures 2.3 cm in craniocaudal dimension and appears stable   Tubular adenoma    Vitamin D  deficiency     Patient Active Problem List   Diagnosis Date Noted   Constipation 09/22/2023   Age-related cognitive decline 07/05/2023   Current chronic use of systemic steroids 04/05/2023   Elevated hemoglobin (HCC) 01/22/2023   PCP NOTES >>>>>>>>>>>>>>>>>>> 09/16/2015   Multiple  thyroid  nodules 03/30/2015   Annual physical exam 08/07/2014   Bloody discharge from nipple - s/p surgery, resolved  12/29/2013   Breast mass, right 12/11/2013   At high risk for falls 12/11/2013   Renal cysts, acquired, bilateral--per urology 05/04/2013   Elevated LFTs 04/01/2013   Cervical spinal stenosis 11/21/2012   Nondiabetic gastroparesis 08/20/2012   Hypothyroidism 08/08/2012   Gastroparesis 05/06/2012   Muscle cramp 01/19/2012   Fatigue 01/14/2012   Neurogenic bladder, prolapse ,h/o urinary retention, self cath, sees urology 11/05/2011   Fecal incontinence 10/03/2011   Osteoporosis 07/24/2011   Adenoma of cecum 07/17/2011   Chronic constipation 07/17/2011   Skin lesion 07/08/2011   Chronic neck pain 04/16/2011   Rectocele 04/16/2011   Hypersomnia-- on dextroamphetamine  04/16/2011   Vitamin D  deficiency 04/16/2011   Family history of colon cancer 04/16/2011   Abnormal blood chemistry 04/16/2011   Anemia    COPD (chronic obstructive pulmonary disease) gold stage B    Depression    GERD (gastroesophageal reflux disease)    Hyperlipidemia    Hypertension    Allergic rhinitis    Pulmonary embolism-- in the 80s    Skin cancer    Seizures (HCC)    Chronic inflammatory demyelinating neuropathy (HCC)     Past  Surgical History:  Procedure Laterality Date   ABDOMINAL HYSTERECTOMY     ANTERIOR FUSION CLIVUS-C2 EXTRAORAL W/ ODONTOID EXCISION  12/2012   APPENDECTOMY     ARTHROSCOPIC REPAIR ACL Left 1976   BASAL CELL CARCINOMA EXCISION     face   BLADDER SUSPENSION     BREAST DUCTAL SYSTEM EXCISION Right 01/15/2014   Procedure: EXCISION DUCTAL SYSTEM RIGHT BREAST;  Surgeon: Lockie Rima, MD;  Location: [facility];  Service: General;  Laterality: Right;   BREAST EXCISIONAL BIOPSY Right    CARPAL TUNNEL RELEASE Bilateral    B, mult times   CATARACT EXTRACTION     bilateral   cervical neck ablation     x 7, C3-C6/3 screws and plate   CESAREAN SECTION      CYSTOCELE REPAIR     CYSTOSCOPY/URETEROSCOPY/HOLMIUM LASER/STENT PLACEMENT Right 09/19/2023   Procedure: CYSTOSCOPY/URETEROSCOPY/BASKETING OF STONES;  Surgeon: Melody Spurling., MD;  Location: [facility];  Service: Urology;  Laterality: Right;   DILATION AND CURETTAGE OF UTERUS     duptyren's contracture right hand     FINGER ARTHRODESIS Right 05/06/2015   Procedure: RIGHT RING PROXIMAL INTERPHALANGEAL FUSION (PIP);  Surgeon: Brunilda Capra, MD;  Location: [facility];  Service: Orthopedics;  Laterality: Right;   FINGER GANGLION CYST EXCISION     right   HEMICOLECTOMY Right    JOINT REPLACEMENT     right and left basal joints of thumbs   left finger fusion     3 fingers on left hand/one finger right   left ovary and tube removed     NASAL POLYP SURGERY     4 sinus surgeries   NASAL SEPTUM SURGERY     PANNICULECTOMY     PROXIMAL INTERPHALANGEAL FUSION (PIP) Left 09/09/2013   Procedure: FUSION LEFT INDEX PROXIMAL INTERPHALANGEAL JOINT (PIP);  Surgeon: Amelie Baize., MD;  Location: [facility];  Service: Orthopedics;  Laterality: Left;   right median nerve decompression     SHOULDER ARTHROSCOPY     x2 left, 1 right   sinus surgeries     x 4   squamous lesions removed     neck and face   STERIOD INJECTION Right 05/06/2015   Procedure: STEROID INJECTION;  Surgeon: Brunilda Capra, MD;  Location:  SURGERY CENTER;  Service: Orthopedics;  Laterality: Right;  Right Index Finger Proximal InterPhalangeal Joint Injection   TONSILLECTOMY AND ADENOIDECTOMY     TUBAL LIGATION     vocal polyps removed      OB History   No obstetric history on file.      Home Medications    Prior to Admission medications   Medication Sig Start Date End Date Taking? Authorizing Provider  albuterol  (PROVENTIL ) (2.5 MG/3ML) 0.083% nebulizer solution Take 2.5 mg by nebulization every 4 (four) hours as needed for shortness of breath or wheezing. 02/24/19   [provider]  amLODipine  (NORVASC ) 10 MG tablet TAKE 1 TABLET(10 MG) BY MOUTH DAILY 04/01/23   [name], MD  arformoterol  (BROVANA ) 15 MCG/2ML NEBU Take 15 mcg by nebulization See admin instructions. Inhale two to three times daily. 08/08/21   [provider]  atorvastatin  (LIPITOR) 40 MG tablet TAKE 1 TABLET BY MOUTH EVERY NIGHT AT BEDTIME 02/16/23   [name], MD  budesonide  (PULMICORT ) 0.25 MG/2ML nebulizer solution Take 0.25 mg by nebulization See admin instructions. Inhale to two to three times daily. 07/22/21   [provider]  CALCIUM  PO Take 600 mg by mouth daily.    [provider]  carbamazepine  (TEGRETOL  XR) 100 MG 12 hr tablet Take 200 mg by mouth at bedtime. 01/16/23 01/24/24  [provider]  Chlorpheniramine Maleate (ALLERGY PO) Take 2 tablets by mouth at bedtime. Takes 2 [facility] Pills at night    [provider]  Cholecalciferol 25 MCG (1000 UT) capsule Take 1,000 Units by mouth daily.    [provider]  Docusate Sodium  (COLACE PO) Take 1 tablet by mouth daily.    [provider]  gabapentin  (NEURONTIN ) 300 MG capsule Take 300 mg by mouth at bedtime. 07/21/19   [provider]  levothyroxine  (SYNTHROID ) 50 MCG tablet TAKE 1 TABLET(50 MCG) BY MOUTH DAILY BEFORE BREAKFAST Patient taking differently: Take 50 mcg by mouth daily. 12/22/22   [name], MD  mometasone  (NASONEX ) 50 MCG/ACT nasal spray Place 2 sprays into the nose in the morning and at bedtime.    [provider]  montelukast  (SINGULAIR ) 10 MG tablet TAKE 1 TABLET(10 MG) BY MOUTH AT BEDTIME 03/22/23   [name], MD  NONFORMULARY OR COMPOUNDED ITEM Take 1 Inhalation by mouth daily. Mometasone /levofloxacin compounded inhalation    [provider]  ondansetron  (ZOFRAN ) 4 MG tablet Take 4 mg by mouth every 6 (six) hours. 09/20/23   [provider]  PHENobarbital  (LUMINAL) 32.4 MG tablet TAKE 1 TABLET  BY MOUTH AT 6 AM AND 2 TABLETS BY MOUTH AT 8 PM. 06/11/23   [name], MD  predniSONE  (DELTASONE ) 5 MG tablet Take 10 mg by mouth daily. 01/12/23   [provider]  VENTOLIN  HFA 108 (90 Base) MCG/ACT inhaler Inhale 1-2 puffs into the lungs every 4 (four) hours as needed for wheezing or shortness of breath. 06/10/19   [provider]    Family History Family History  Problem Relation Age of Onset   Heart disease Mother    Hyperlipidemia Mother        M, aunt   Colon cancer Father        60   Ovarian cancer Sister 69 - 2   Lung cancer Sister        lung - stage 1   Thyroid  cancer Sister    Skin cancer Daughter    Malignant hyperthermia Cousin    Stomach cancer Other        first cousin   Breast cancer Neg Hx    Esophageal cancer Neg Hx    Pancreatic cancer Neg Hx     Social History Social History   Tobacco Use   Smoking status: Former    Current packs/day: 0.00    Average packs/day: 0.5 packs/day for 3.0 years (1.5 ttl pk-yrs)    Types: Cigarettes    Start date: 05/30/1975    Quit date: 05/29/1978    Years since quitting: 45.4   Smokeless tobacco: Never   Tobacco comments:    quit smoking 35 years ago  Vaping Use   Vaping status: Never Used  Substance Use Topics   Alcohol  use: No   Drug use: No     Allergies   Iodinated contrast media, Iodine, Metrizamide, Morphine, Morphine and codeine, Cocklebur, Lambs quarters, Mugwort, Sheep sorrel, Spiny pigweed (amaranthus spinosus) skin test, and Tramadol    Review of Systems Review of Systems  Cardiovascular:  Positive for chest pain.     Physical Exam Triage Vital Signs ED Triage Vitals  Encounter Vitals Group  BP 11/05/23 1434 (!) 171/85     Systolic BP Percentile --      Diastolic BP Percentile --      Pulse Rate 11/05/23 1434 100     Resp 11/05/23 1434 (!) 22     Temp 11/05/23 1434 97.8 F (36.6 C)     Temp Source 11/05/23 1434 Oral     SpO2 11/05/23 1434 92 %     Weight 11/05/23  1434 133 lb (60.3 kg)     Height 11/05/23 1434 5\' 2"  (1.575 m)     Head Circumference --      Peak Flow --      Pain Score 11/05/23 1452 8     Pain Loc --      Pain Education --      Exclude from Growth Chart --    No data found.  Updated Vital Signs BP (!) 171/85 (BP Location: Right Arm)   Pulse 100   Temp 97.8 F (36.6 C) (Oral)   Resp (!) 22 Comment: Uneven and Labored  Ht 5\' 2"  (1.575 m)   Wt 133 lb (60.3 kg)   SpO2 92%   BMI 24.33 kg/m   Visual Acuity Right Eye Distance:   Left Eye Distance:   Bilateral Distance:    Right Eye Near:   Left Eye Near:    Bilateral Near:     Physical Exam Vitals reviewed.  Constitutional:      General: She is awake. She is not in acute distress.    Appearance: She is well-developed and well-groomed. She is ill-appearing and diaphoretic. She is not toxic-appearing.     Comments: Pt is a pleasant 89 yo female who appears stated age. She is slightly ill appearing and mildly diaphoretic. She is intermittently clutching at the left side of her chest due to pain   HENT:     Head: Normocephalic and atraumatic.  Cardiovascular:     Rate and Rhythm: Normal rate and regular rhythm.     Pulses:          Radial pulses are 2+ on the left side.     Heart sounds: Normal heart sounds. No murmur heard.    No friction rub. No gallop.  Pulmonary:     Effort: Pulmonary effort is normal. No respiratory distress.     Breath sounds: No decreased breath sounds, wheezing, rhonchi or rales.  Musculoskeletal:     Cervical back: Normal range of motion.  Skin:    General: Skin is warm.  Neurological:     General: No focal deficit present.     Mental Status: She is alert and oriented to person, place, and time.  Psychiatric:        Mood and Affect: Mood normal.        Behavior: Behavior normal. Behavior is cooperative.      UC Treatments / Results  Labs (all labs ordered are listed, but only abnormal results are displayed) Labs Reviewed - No  data to display  EKG   Radiology No results found.  Procedures ED EKG  Date/Time: 11/05/2023 3:24 PM  Performed by: Jerona Mooring, PA-C Authorized by: Jerona Mooring, PA-C   Previous ECG:    Previous ECG:  Compared to current   Comparison ECG info:  09/13/23- previous EKG is largely limited by artifact Interpretation:    Interpretation: abnormal     Details:  Suspect ST elevations in V3-6 Rate:    ECG rate assessment: tachycardic  Rhythm:    Rhythm: sinus tachycardia   ST segments:    ST segments:  Elevation   Elevation:  V3, V4, V5, V6 and II T waves:    T waves: non-specific    (including critical care time)  Medications Ordered in UC Medications  aspirin chewable tablet 162 mg (162 mg Oral Given 11/05/23 1457)    Initial Impression / Assessment and Plan / UC Course  I have reviewed the triage vital signs and the nursing notes.  Pertinent labs & imaging results that were available during my care of the patient were reviewed by me and considered in my medical decision making (see chart for details).      Final Clinical Impressions(s) / UC Diagnoses   Final diagnoses:  Chest pain, unspecified type   Pt presents today with concerns for left sided chest pain that started approx 10 minute prior to arrival and is constant and sharp in nature. She appears mildly diaphoretic and is clutching at her left chest during triage. EKG was performed and show multiple areas of ST changes (largely what appears to be elevations in V3-6 and lead 2). I reviewed findings with patient and her husband and they were amenable to going to ED via EMS transport. Aspirin 162 mg was administered and IV line was established with 20 g in left AC prior to EMS taking patient.   Discharge Instructions   None    ED Prescriptions   None    PDMP not reviewed this encounter.   Hargis Vandyne, Pearla Bottom, PA-C 11/05/23 1552

## 2023-11-05 NOTE — ED Triage Notes (Signed)
 PT BIB EMS from UC with complaints of intermittent chest pain x 2 days. Sent for further evaluation.  Pain 8/10 initially and 4/10 at this time after medication.  324 asa 1 nitroglycerin  132/80 98%ra 20 LAC

## 2023-11-05 NOTE — ED Notes (Signed)
 Patient is being discharged from the Urgent Care and sent to the Emergency Department via EMS transport . Per Cleveland Dales Mecum PA, patient is in need of higher level of care due to abnormal EKG reading and reported left-sided chest pain. Patient is aware and verbalizes understanding of plan of care.  Vitals:   11/05/23 1434  BP: (!) 171/85  Pulse: 100  Resp: (!) 22  Temp: 97.8 F (36.6 C)  SpO2: 92%

## 2023-11-05 NOTE — Consult Note (Signed)
 Cardiology Consultation   Patient ID: [name] MRN: [id number]; DOB: 05-01-1933  Admit date: 11/05/2023 Date of Consult: 11/05/2023  PCP:  [name], MD   [facility] Providers Cardiologist:  None   { Click here to update MD or APP on Care Team, Refresh:1}     Patient Profile: [name] is a 89 y.o. female with a hx of *** who is being seen 11/05/2023 for the evaluation of *** at the request of ***.  History of Present Illness: [name] ***   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 (HCC) 03/2011   chronic sinusitis 08/08/2012   Colon polyp    Constipation    COPD (chronic obstructive pulmonary disease) (HCC)    Cystocele 09/16/2012   GERD (gastroesophageal reflux disease)    History of kidney stones    Hyperlipidemia    Hypertension    Hypothyroid    Neurogenic bladder 2013   husband caths pt, manages with timed void   Osteoporosis    Pulmonary embolism (HCC)    1980s   Raynaud phenomenon    Renal cyst    Non-complex, contrast MRI 2013 with L>R non-complex cysts, dominant 3.7 left lower pole   Seizures (HCC)    1990 last seizures on meds Phenobarb   Shingles late 90's   Skin cancer    squamous cell   Thyroid  nodule    Right thyroid  lobe, only seen on Sagittal imaging measures 2.3 cm in craniocaudal dimension and appears stable   Tubular adenoma    Vitamin D  deficiency     Past Surgical History:  Procedure Laterality Date   ABDOMINAL HYSTERECTOMY     ANTERIOR FUSION CLIVUS-C2 EXTRAORAL W/ ODONTOID EXCISION  12/2012   APPENDECTOMY     ARTHROSCOPIC REPAIR ACL Left 1976   BASAL CELL CARCINOMA EXCISION     face   BLADDER SUSPENSION     BREAST DUCTAL SYSTEM EXCISION Right 01/15/2014   Procedure:  EXCISION DUCTAL SYSTEM RIGHT BREAST;  Surgeon: Lockie Rima, MD;  Location: [facility];  Service: General;  Laterality: Right;   BREAST EXCISIONAL BIOPSY Right    CARPAL TUNNEL RELEASE Bilateral    B, mult times   CATARACT EXTRACTION     bilateral   cervical neck ablation     x 7, C3-C6/3 screws and plate   CESAREAN SECTION     CYSTOCELE REPAIR     CYSTOSCOPY/URETEROSCOPY/HOLMIUM LASER/STENT PLACEMENT Right 09/19/2023   Procedure: CYSTOSCOPY/URETEROSCOPY/BASKETING OF STONES;  Surgeon: Melody Spurling., MD;  Location: [facility];  Service: Urology;  Laterality: Right;   DILATION AND CURETTAGE OF UTERUS     duptyren's contracture right hand     FINGER ARTHRODESIS Right 05/06/2015   Procedure: RIGHT RING PROXIMAL INTERPHALANGEAL FUSION (PIP);  Surgeon: Brunilda Capra, MD;  Location: [facility];  Service: Orthopedics;  Laterality: Right;   FINGER GANGLION CYST EXCISION     right   HEMICOLECTOMY Right    JOINT REPLACEMENT     right and left basal joints of thumbs   left finger fusion     3 fingers on left hand/one finger right   left ovary and tube removed  NASAL POLYP SURGERY     4 sinus surgeries   NASAL SEPTUM SURGERY     PANNICULECTOMY     PROXIMAL INTERPHALANGEAL FUSION (PIP) Left 09/09/2013   Procedure: FUSION LEFT INDEX PROXIMAL INTERPHALANGEAL JOINT (PIP);  Surgeon: Amelie Baize., MD;  Location: [facility];  Service: Orthopedics;  Laterality: Left;   right median nerve decompression     SHOULDER ARTHROSCOPY     x2 left, 1 right   sinus surgeries     x 4   squamous lesions removed     neck and face   STERIOD INJECTION Right 05/06/2015   Procedure: STEROID INJECTION;  Surgeon: Brunilda Capra, MD;  Location: [facility];  Service: Orthopedics;  Laterality: Right;  Right Index Finger Proximal InterPhalangeal Joint Injection   TONSILLECTOMY AND ADENOIDECTOMY     TUBAL LIGATION     vocal polyps removed       {Home  Medications (Optional):21181}  Scheduled Meds:  Romosozumab -aqqg  210 mg Subcutaneous Once   [START ON 11/15/2023] Romosozumab -aqqg  210 mg Subcutaneous Q30 days   Continuous Infusions:  PRN Meds: acetaminophen   Allergies:    Allergies  Allergen Reactions   Iodinated Contrast Media Anaphylaxis    Cardiac arrest   Iodine Other (See Comments)    Cardiac arrest As young adult, received iodinated contrast for IVP, went into cardiac arrest, was resuscitated and hospitalized. Has not received iodinated contrast since that episode. Reports no adverse reaction to betadine.    Metrizamide Other (See Comments)    Cardiac arrest     Morphine Other (See Comments)    Cardiac arrest Patient has previously tolerated hydrocodone, hydromorphone  and fentanyl     Morphine And Codeine Other (See Comments)    Cardiac arrest   Cocklebur    Lambs Quarters    Mugwort    Sheep Sorrel    Spiny Pigweed (Amaranthus Spinosus) Skin Test    Tramadol  Other (See Comments)    Causing severe constipation     Social History:   Social History   Socioeconomic History   Marital status: Married    Spouse name: Not on file   Number of children: 3   Years of education: Not on file   Highest education level: Not on file  Occupational History   Occupation: retired, Education administrator: RETIRED  Tobacco Use   Smoking status: Former    Current packs/day: 0.00    Average packs/day: 0.5 packs/day for 3.0 years (1.5 ttl pk-yrs)    Types: Cigarettes    Start date: 05/30/1975    Quit date: 05/29/1978    Years since quitting: 45.4   Smokeless tobacco: Never   Tobacco comments:    quit smoking 35 years ago  Vaping Use   Vaping status: Never Used  Substance and Sexual Activity   Alcohol  use: No   Drug use: No   Sexual activity: Not Currently  Other Topics Concern   Not on file  Social History Narrative   Pt lives at home with her husband. She is originally from [place]. Moved to [place] in 2005.      3 grown children - Son is a Education officer, community in the area, son in [place] , daughter in [place]   Pt is a retired Engineer, civil (consulting)   Social Drivers of Corporate investment banker Strain: Low Risk  (11/16/2022)   Overall Financial Resource Strain (CARDIA)    Difficulty of Paying Living Expenses: Not hard at all  Food Insecurity: No Food Insecurity (09/23/2023)   Hunger Vital Sign    Worried About Running Out of Food in the Last Year: Never true    Ran Out of Food in the Last Year: Never true  Transportation Needs: No Transportation Needs (09/23/2023)   PRAPARE - Administrator, Civil Service (Medical): No    Lack of Transportation (Non-Medical): No  Physical Activity: Inactive (11/16/2022)   Exercise Vital Sign    Days of Exercise per Week: 0 days    Minutes of Exercise per Session: 0 min  Stress: No Stress Concern Present (11/16/2022)   [employer] of Occupational Health - Occupational Stress Questionnaire    Feeling of Stress : Not at all  Social Connections: Socially Integrated (09/22/2023)   Social Connection and Isolation Panel [NHANES]    Frequency of Communication with Friends and Family: More than three times a week    Frequency of Social Gatherings with Friends and Family: More than three times a week    Attends Religious Services: 1 to 4 times per year    Active Member of [employer] or Organizations: Yes    Attends Banker Meetings: 1 to 4 times per year    Marital Status: Married  Catering manager Violence: Not At Risk (09/23/2023)   Humiliation, Afraid, Rape, and Kick questionnaire    Fear of Current or Ex-Partner: No    Emotionally Abused: No    Physically Abused: No    Sexually Abused: No    Family History:   *** Family History  Problem Relation Age of Onset   Heart disease Mother    Hyperlipidemia Mother        M, aunt   Colon cancer Father        68   Ovarian cancer Sister 67 - 27   Lung cancer Sister        lung - stage 1   Thyroid  cancer Sister    Skin  cancer Daughter    Malignant hyperthermia Cousin    Stomach cancer Other        first cousin   Breast cancer Neg Hx    Esophageal cancer Neg Hx    Pancreatic cancer Neg Hx      ROS:  Please see the history of present illness.  *** All other ROS reviewed and negative.     Physical Exam/Data: Vitals:   11/05/23 1553 11/05/23 2051 11/05/23 2124 11/05/23 2244  BP: (!) 147/82 (!) 200/150  135/67  Pulse: 92 84  72  Resp: 16 13  16   Temp: 98.8 F (37.1 C)  98.9 F (37.2 C) 98.7 F (37.1 C)  TempSrc: Oral  Oral Oral  SpO2: 97% 100%  100%   No intake or output data in the 24 hours ending 11/05/23 2330    11/05/2023    2:34 PM 10/19/2023    3:32 PM 10/16/2023    2:10 PM  Last 3 Weights  Weight (lbs) 133 lb 131 lb 131 lb 4 oz  Weight (kg) 60.328 kg 59.421 kg 59.535 kg     There is no height or weight on file to calculate BMI.  General:  Well nourished, well developed, in no acute distress*** HEENT: normal Neck: no JVD Vascular: No carotid bruits; Distal pulses 2+ bilaterally Cardiac:  normal S1, S2; RRR; no murmur *** Lungs:  clear to auscultation bilaterally, no wheezing, rhonchi or rales  Abd: soft, nontender, no hepatomegaly  Ext: no edema Musculoskeletal:  No deformities,  BUE and BLE strength normal and equal Skin: warm and dry  Neuro:  CNs 2-12 intact, no focal abnormalities noted Psych:  Normal affect   EKG:  The EKG was personally reviewed and demonstrates:  *** Telemetry:  Telemetry was personally reviewed and demonstrates:  ***  Relevant CV Studies: ***  Laboratory Data: High Sensitivity Troponin:   Recent Labs  Lab 11/05/23 1600 11/05/23 2054  TROPONINIHS 4 4     Chemistry Recent Labs  Lab 11/05/23 1600  NA 138  K 4.2  CL 107  CO2 26  GLUCOSE 107*  BUN 23  CREATININE 0.95  CALCIUM  8.6*  GFRNONAA 57*  ANIONGAP 5    No results for input(s): "PROT", "ALBUMIN", "AST", "ALT", "ALKPHOS", "BILITOT" in the last 168 hours. Lipids No results for  input(s): "CHOL", "TRIG", "HDL", "LABVLDL", "LDLCALC", "CHOLHDL" in the last 168 hours.  Hematology Recent Labs  Lab 11/05/23 1600  WBC 4.8  RBC 4.66  HGB 14.7  HCT 43.6  MCV 93.6  MCH 31.5  MCHC 33.7  RDW 13.4  PLT 217   Thyroid  No results for input(s): "TSH", "FREET4" in the last 168 hours.  BNPNo results for input(s): "BNP", "PROBNP" in the last 168 hours.  DDimer No results for input(s): "DDIMER" in the last 168 hours.  Radiology/Studies:  DG Chest 2 View Result Date: 11/05/2023 CLINICAL DATA:  chest pain presents with complaints of left-sided chest pain that began approximately five minutes PTA. States she was hospitalized for valve blockage two weeks ago. Pt describes the pain as sharp EXAM: CHEST - 2 VIEW COMPARISON:  Chest x-ray 05/30/2021 FINDINGS: The heart and mediastinal contours are unchanged. Atherosclerotic plaque. Biapical pleural/pulmonary scarring. Left base airspace opacity. No pulmonary edema. No pleural effusion. No pneumothorax. No acute osseous abnormality. IMPRESSION: 1. Left base airspace opacities that could represent a combination of atelectasis versus infection/inflammation. Followup PA and lateral chest X-ray is recommended in 3-4 weeks following therapy to ensure resolution and exclude underlying malignancy. 2.  Aortic Atherosclerosis (ICD10-I70.0). Electronically Signed   By: Morgane  Naveau M.D.   On: 11/05/2023 17:27     Assessment and Plan: ***   Risk Assessment/Risk Scores: {Complete the following score calculators/questions to meet required metrics.  Press F2         :[id number]}   {Is the patient being seen for unstable angina, ACS, NSTEMI or STEMI?:[phone]} {Does this patient have CHF or CHF symptoms?      :[id number]} {Does this patient have ATRIAL FIBRILLATION?[phone]}  {Are we signing off today?:[id number]}  For questions or updates, please contact [facility] Please consult [email or web address] for contact info under     Signed, Christena Covert, MD  11/05/2023 11:30 PM

## 2023-11-05 NOTE — ED Provider Triage Note (Signed)
 Emergency Medicine Provider Triage Evaluation Note  [name] , a 89 y.o. female  was evaluated in triage.  Pt complains of left-sided chest pain, just lateral to the left breast.  Started last night just before bed.  She slept without difficulty.  Woke up with the pain present, became more severe and prompted her to go to urgent care.  Has never had pain like this before.  No cardiac history.  Admits to pulmonary embolism about 40 years ago, not on anticoagulation.  Slight improvement of symptoms after receiving aspirin in urgent care.  Review of Systems  Positive: Left chest pain, Negative: SOB, cough, hemoptysis, leg swelling, fever, back pain, trauma  Physical Exam  BP (!) 147/82 (BP Location: Right Arm)   Pulse 92   Temp 98.8 F (37.1 C) (Oral)   Resp 16   SpO2 97%  Gen:   Awake, no distress   Resp:  Normal effort, equal breath sounds, faint scattered wheezes MSK:   Moves extremities without difficulty, no swelling Other:  Patient intermittently winces and grabs the side sometimes with deep breaths  Medical Decision Making  Medically screening exam initiated at 4:27 PM.  Appropriate orders placed.  [name] was informed that the remainder of the evaluation will be completed by another provider, this initial triage assessment does not replace that evaluation, and the importance of remaining in the ED until their evaluation is complete.  89 year old female presents emergency department left-sided chest pain.  Patient evaluated sitting up in chair, fully clothed, limited PE. Orders placed. Patient was counseled that they need to remain in the ED until the completion of their work-up including a full H&P, additional testing and results of any tests.  The patient appears stable and the remainder of the encounter may be completed by another provider.   Flonnie Humphrey, DO 11/05/23 1630

## 2023-11-05 NOTE — ED Triage Notes (Addendum)
 Pt presents with complaints of left-sided chest pain that began approximately five minutes PTA. States she was hospitalized for valve blockage two weeks ago. Pt describes the pain as sharp. Rates an 8/10.  EKG already obtained at this time and given to [facility] PA. EMS transport called and are in route.

## 2023-11-06 ENCOUNTER — Observation Stay (HOSPITAL_COMMUNITY)

## 2023-11-06 ENCOUNTER — Encounter (HOSPITAL_COMMUNITY): Payer: Self-pay | Admitting: Internal Medicine

## 2023-11-06 DIAGNOSIS — Z86711 Personal history of pulmonary embolism: Secondary | ICD-10-CM | POA: Insufficient documentation

## 2023-11-06 DIAGNOSIS — E039 Hypothyroidism, unspecified: Secondary | ICD-10-CM

## 2023-11-06 DIAGNOSIS — R079 Chest pain, unspecified: Secondary | ICD-10-CM

## 2023-11-06 DIAGNOSIS — J449 Chronic obstructive pulmonary disease, unspecified: Secondary | ICD-10-CM

## 2023-11-06 DIAGNOSIS — E7800 Pure hypercholesterolemia, unspecified: Secondary | ICD-10-CM

## 2023-11-06 DIAGNOSIS — I10 Essential (primary) hypertension: Secondary | ICD-10-CM

## 2023-11-06 DIAGNOSIS — R0789 Other chest pain: Principal | ICD-10-CM | POA: Diagnosis present

## 2023-11-06 LAB — NM MYOCAR MULTI W/SPECT W/WALL MOTION / EF
Exercise duration (min): 5 min
LV dias vol: 18 mL (ref 46–106)
LV sys vol: 1 mL
MPHR: 131 {beats}/min
Nuc Stress EF: 94 %
Peak HR: 120 {beats}/min
Percent HR: 91 %
Rest HR: 90 {beats}/min
ST Depression (mm): 0 mm

## 2023-11-06 LAB — D-DIMER, QUANTITATIVE: D-Dimer, Quant: 0.58 ug{FEU}/mL — ABNORMAL HIGH (ref 0.00–0.50)

## 2023-11-06 LAB — PROCALCITONIN: Procalcitonin: <0.1 ng/mL

## 2023-11-06 MED ORDER — ATORVASTATIN CALCIUM 40 MG PO TABS: 40.0000 mg | ORAL_TABLET | Freq: Every day | ORAL | Status: DC

## 2023-11-06 MED ORDER — ACETAMINOPHEN 650 MG RE SUPP
650.0000 mg | Freq: Four times a day (QID) | RECTAL | Status: DC | PRN
Start: 2023-11-06 — End: 2023-11-06

## 2023-11-06 MED ORDER — MONTELUKAST SODIUM 10 MG PO TABS: 10.0000 mg | ORAL_TABLET | Freq: Every day | ORAL | Status: DC

## 2023-11-06 MED ORDER — BUDESONIDE 0.25 MG/2ML IN SUSP
0.2500 mg | Freq: Two times a day (BID) | RESPIRATORY_TRACT | Status: DC
  Filled 2023-11-06: qty 2

## 2023-11-06 MED ORDER — AMLODIPINE BESYLATE 5 MG PO TABS: 10.0000 mg | ORAL_TABLET | Freq: Every day | ORAL | Status: DC

## 2023-11-06 MED ORDER — PHENOBARBITAL 64.8 MG PO TABS: 64.8000 mg | ORAL_TABLET | Freq: Every evening | ORAL | Status: DC

## 2023-11-06 MED ORDER — GABAPENTIN 300 MG PO CAPS: 300.0000 mg | ORAL_CAPSULE | Freq: Every day | ORAL | Status: DC

## 2023-11-06 MED ORDER — NITROGLYCERIN 0.4 MG SL SUBL: 0.4000 mg | SUBLINGUAL_TABLET | SUBLINGUAL | Status: DC | PRN

## 2023-11-06 MED ORDER — PREDNISONE 5 MG PO TABS: 10.0000 mg | ORAL_TABLET | Freq: Every day | ORAL | Status: DC

## 2023-11-06 MED ORDER — ARFORMOTEROL TARTRATE 15 MCG/2ML IN NEBU
15.0000 ug | INHALATION_SOLUTION | Freq: Two times a day (BID) | RESPIRATORY_TRACT | Status: DC
  Filled 2023-11-06: qty 2

## 2023-11-06 MED ORDER — REGADENOSON 0.4 MG/5ML IV SOLN
0.4000 mg | Freq: Once | INTRAVENOUS | Status: AC
  Administered 2023-11-06: 0.4 mg via INTRAVENOUS

## 2023-11-06 MED ORDER — TECHNETIUM TC 99M TETROFOSMIN IV KIT
10.0000 | PACK | Freq: Once | INTRAVENOUS | Status: AC | PRN
  Administered 2023-11-06: 10 via INTRAVENOUS

## 2023-11-06 MED ORDER — TECHNETIUM TC 99M TETROFOSMIN IV KIT
30.0000 | PACK | Freq: Once | INTRAVENOUS | Status: AC | PRN
  Administered 2023-11-06: 30 via INTRAVENOUS

## 2023-11-06 MED ORDER — NITROGLYCERIN 2 % TD OINT: 1.0000 [in_us] | TOPICAL_OINTMENT | Freq: Once | TRANSDERMAL | Status: DC

## 2023-11-06 MED ORDER — PHENOBARBITAL 32.4 MG PO TABS
32.4000 mg | ORAL_TABLET | Freq: Once | ORAL | Status: AC
  Administered 2023-11-06: 32.4 mg via ORAL
  Filled 2023-11-06: qty 1

## 2023-11-06 MED ORDER — LEVOTHYROXINE SODIUM 25 MCG PO TABS
50.0000 ug | ORAL_TABLET | Freq: Every day | ORAL | Status: DC
  Administered 2023-11-06: 50 ug via ORAL

## 2023-11-06 MED ORDER — ACETAMINOPHEN 325 MG PO TABS: 650.0000 mg | ORAL_TABLET | Freq: Four times a day (QID) | ORAL | Status: DC | PRN

## 2023-11-06 MED ORDER — PHENOBARBITAL 32.4 MG PO TABS: 32.4000 mg | ORAL_TABLET | ORAL | Status: DC

## 2023-11-06 MED ORDER — REGADENOSON 0.4 MG/5ML IV SOLN
INTRAVENOUS | Status: AC
  Filled 2023-11-06: qty 5

## 2023-11-06 MED ORDER — LEVALBUTEROL HCL 0.63 MG/3ML IN NEBU: 0.6300 mg | INHALATION_SOLUTION | Freq: Four times a day (QID) | RESPIRATORY_TRACT | Status: DC | PRN

## 2023-11-06 MED ORDER — CARBAMAZEPINE ER 200 MG PO TB12
200.0000 mg | ORAL_TABLET | Freq: Every day | ORAL | Status: DC
  Filled 2023-11-06: qty 1

## 2023-11-06 MED ORDER — ENOXAPARIN SODIUM 40 MG/0.4ML IJ SOSY: 40.0000 mg | PREFILLED_SYRINGE | INTRAMUSCULAR | Status: DC

## 2023-11-06 MED ORDER — PHENOBARBITAL 32.4 MG PO TABS: 32.4000 mg | ORAL_TABLET | Freq: Every morning | ORAL | Status: DC

## 2023-11-06 NOTE — Progress Notes (Signed)
   [name] presented for a lexiscan cardiolite today.  No immediate complications.  Stress imaging is pending at this time.  Johnie Nailer, NP 11/06/2023, 11:39 AM

## 2023-11-06 NOTE — Progress Notes (Signed)
 PROGRESS NOTE    [name]  QMV:[id number] DOB: 04/10/1933 DOA: 11/05/2023 PCP: [name], MD  Subjective: Pt seen and examined. Met with pt and husband at bedside. Discussed normal stress test. Stable for DC to home.   Hospital Course: HPI: [name] is a 89 y.o. female with medical history significant of asthma-COPD, CIDP, hypertension, hyperlipidemia, hypothyroidism, remote history of PE and currently not on anticoagulation, chronic constipation, osteoporosis, gastroparesis, GERD presenting with a chief complaint of chest pain.  Patient states today around 1 PM in the afternoon she was sitting when all of a sudden she started having severe sharp nonradiating left-sided chest pain.  She was not having any nausea, vomiting, or shortness of breath.  She has continued to have intermittent episodes severe sharp of left-sided chest pain every 15 to 20 minutes and pain is worse when taking deep breaths.  She denies history of exertional chest pain in the past.  Denies fevers or cough.  She reports history of PE about 20 or 30 years ago when she was in [place]  and currently not on anticoagulation.  Denies any leg pain or swelling.  Denies abdominal pain.  No other complaints.   ED Course: Patient received aspirin 324 mg and 1 nitroglycerin by EMS.  Vital signs on arrival: Temperature 98.8 F, pulse 92, respiratory rate 16, blood pressure 147/82, and SpO2 97% on room air.  Troponin negative x 2.  EKG showing sinus rhythm, LAFB, and no acute ischemic changes.  Chest x-ray showing left basilar airspace opacities suspicious for atelectasis versus infection/inflammation.  Cardiology saw the patient and ordered echocardiogram and nuclear medicine stress test.  Significant Events: Admitted 11/05/2023 for chest pain   Admission Labs: WBC 4.8, HgB 14.7, plt 217 Na 138, k 4.2, CO2 of 26, BUN 23, Scr 0.95, glu 107 Trop 4 -> 4 Procal < 0.10  Admission Imaging Studies: CXR Left  base airspace opacities that could represent a combination of atelectasis versus infection/inflammation. Followup PA and lateral chest X-ray is recommended in 3-4 weeks following therapy to ensure resolution and exclude underlying malignancy. 2.  Aortic Atherosclerosis  Significant Labs:   Significant Imaging Studies:   Antibiotic Therapy: Anti-infectives (From admission, onward)    None       Procedures: Nuclear stress test  Consultants: cardiology    Assessment and Plan: * Non-cardiac chest pain 11-06-2023 cards consulted. Had [facility] echo at [facility] 10[facility]25-20[facility]. Nuclear stress test negative. 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 Ok to take up to 4000 mg of tylenol  per day. Discussed with pt and husband. Safe for discharge today.  Acquired hypothyroidism 11-06-2023 continue synthroid   Essential hypertension 11-06-2023 continue norvasc   Hyperlipidemia 11-06-2023 continue lipitor   Lipid Panel: Lab Results  Component Value Date/Time   CHOL 235 (H) 06/29/2022 11:04 AM   TRIG 114.0 06/29/2022 11:04 AM   HDL 74.30 06/29/2022 11:04 AM   CHOLHDL 3 06/29/2022 11:04 AM   VLDL 22.8 06/29/2022 11:04 AM   LDLCALC 138 (H) 06/29/2022 11:04 AM     COPD (chronic obstructive pulmonary disease) gold stage B Stable. Not exacerbated.   DVT prophylaxis: enoxaparin  (LOVENOX ) injection 40 mg Start: 11/06/23 1000     Code Status: Full Code Family Communication: discussed with pt and husband Disposition Plan: return home Reason for continuing need for hospitalization: stable for DC  Objective: Vitals:   11/06/23 1139 11/06/23 1315 11/06/23 1319 11/06/23 1330  BP: (!) 130/56 102/82  120/63  Pulse: (!) 111 98  99  Resp: 16 14  20   Temp:   97.8 F (36.6 C)   TempSrc:   Oral   SpO2: 98% 97%  96%  Weight:      Height:       No intake or output data in the 24 hours ending 11/06/23 1516 Filed Weights   11/06/23 0356  Weight: 60.3 kg    Examination:  Physical  Exam Vitals and nursing note reviewed.  HENT:     Head: Normocephalic and atraumatic.     Nose: Nose normal.  Eyes:     General: No scleral icterus. Cardiovascular:     Rate and Rhythm: Normal rate and regular rhythm.  Pulmonary:     Effort: Pulmonary effort is normal.  Abdominal:     General: Abdomen is flat.  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     Capillary Refill: Capillary refill takes less than 2 seconds.  Neurological:     General: No focal deficit present.     Mental Status: She is alert and oriented to person, place, and time.    Data Reviewed: I have personally reviewed following labs and imaging studies  CBC: Recent Labs  Lab 11/05/23 1600  WBC 4.8  HGB 14.7  HCT 43.6  MCV 93.6  PLT 217   Basic Metabolic Panel: Recent Labs  Lab 11/05/23 1600  NA 138  K 4.2  CL 107  CO2 26  GLUCOSE 107*  BUN 23  CREATININE 0.95  CALCIUM  8.6*   GFR: Estimated Creatinine Clearance: 34.4 mL/min (by C-G formula based on SCr of 0.95 mg/dL).  Sepsis Labs: Recent Labs  Lab 11/06/23 0331  PROCALCITON <0.10   Radiology Studies: NM Myocar Multi W/Spect W/Wall Motion / EF Result Date: 11/06/2023   The study is normal. The study is low risk.   No ST deviation was noted.   LV perfusion is normal. There is no evidence of ischemia. There is no evidence of infarction.   Left ventricular function is normal. Nuclear stress EF: 94%. The left ventricular ejection fraction is hyperdynamic (>65%). End diastolic cavity size is normal. End systolic cavity size is normal.   Coronary calcium  assessment not performed   DG Chest 2 View Result Date: 11/05/2023 CLINICAL DATA:  chest pain presents with complaints of left-sided chest pain that began approximately five minutes PTA. States she was hospitalized for valve blockage two weeks ago. Pt describes the pain as sharp EXAM: CHEST - 2 VIEW COMPARISON:  Chest x-ray  05/30/2021 FINDINGS: The heart and mediastinal contours are unchanged. Atherosclerotic plaque. Biapical pleural/pulmonary scarring. Left base airspace opacity. No pulmonary edema. No pleural effusion. No pneumothorax. No acute osseous abnormality. IMPRESSION: 1. Left base airspace opacities that could represent a combination of atelectasis versus infection/inflammation. Followup PA and lateral chest X-ray is recommended in 3-4 weeks following therapy to ensure resolution and exclude underlying malignancy. 2.  Aortic Atherosclerosis (ICD10-I70.0). Electronically Signed   By: Morgane  Naveau M.D.   On: 11/05/2023 17:27    Scheduled Meds:  amLODipine   10 mg Oral Daily   arformoterol   15 mcg Nebulization BID   atorvastatin   40 mg Oral QHS   budesonide   0.25 mg Nebulization BID   carbamazepine   200 mg Oral QHS   enoxaparin  (LOVENOX ) injection  40 mg Subcutaneous Q24H   gabapentin   300 mg Oral QHS   levothyroxine   50 mcg  Oral Daily   montelukast   10 mg Oral QHS   PHENobarbital   32.4 mg Oral q morning   And   PHENobarbital   64.8 mg Oral QPM   predniSONE   10 mg Oral Daily   Romosozumab -aqqg  210 mg Subcutaneous Once   [START ON 11/15/2023] Romosozumab -aqqg  210 mg Subcutaneous Q30 days   Continuous Infusions:   LOS: 0 days   Time spent: 50 minutes  Unk Garb, DO  [facility]  11/06/2023, 3:16 PM

## 2023-11-06 NOTE — Assessment & Plan Note (Signed)
 11-06-2023 continue norvasc 

## 2023-11-06 NOTE — Progress Notes (Signed)
 Progress Note  Patient Name: [name] Date of Encounter: 11/06/2023  [facility] Cardiologist: None   Patient Profile     Subjective   Patient with history of hypertension, hyperlipidemia, CIDP, seizure disorder and COPD admitted with chest pain.  High-sensitivity troponin negative x 2.  Labs normal.  Chest x-ray with left base airspace opacities possibly atelectasis versus infection.  Currently n.p.o. for nuclear stress test.  No further chest pain since admission  Inpatient Medications    Scheduled Meds:  amLODipine   10 mg Oral Daily   arformoterol   15 mcg Nebulization BID   atorvastatin   40 mg Oral QHS   budesonide   0.25 mg Nebulization BID   carbamazepine   200 mg Oral QHS   enoxaparin  (LOVENOX ) injection  40 mg Subcutaneous Q24H   gabapentin   300 mg Oral QHS   levothyroxine   50 mcg Oral Daily   montelukast   10 mg Oral QHS   PHENobarbital   32.4 mg Oral q morning   And   PHENobarbital   64.8 mg Oral QPM   phenobarbital   32.4 mg Oral Once   predniSONE   10 mg Oral Daily   Romosozumab -aqqg  210 mg Subcutaneous Once   [START ON 11/15/2023] Romosozumab -aqqg  210 mg Subcutaneous Q30 days   Continuous Infusions:  PRN Meds: acetaminophen  **OR** acetaminophen , levalbuterol   Vital Signs    Vitals:   11/06/23 0530 11/06/23 0556 11/06/23 0645 11/06/23 0745  BP: (!) 153/81 (!) 167/106 104/88 (!) 140/58  Pulse: 94 (!) 105 85 87  Resp: 15 18 15 12   Temp:  97.7 F (36.5 C)    TempSrc:  Oral    SpO2: 99% 99% 96% 98%  Weight:      Height:       No intake or output data in the 24 hours ending 11/06/23 0755    11/06/2023    3:56 AM 11/05/2023    2:34 PM 10/19/2023    3:32 PM  Last 3 Weights  Weight (lbs) 132 lb 15.7 oz 133 lb 131 lb  Weight (kg) 60.32 kg 60.328 kg 59.421 kg      Telemetry    Normal sinus rhythm- Personally Reviewed  ECG    Normal sinus rhythm with left axis deviation cannot rule out anterior lateral infarct age undetermined-  Personally Reviewed  Physical Exam   GEN: No acute distress.   Neck: No JVD Cardiac: RRR, no murmurs, rubs, or gallops.  Respiratory: Clear to auscultation bilaterally. GI: Soft, nontender, non-distended  MS: No edema; No deformity. Neuro:  Nonfocal  Psych: Normal affect   Labs    High Sensitivity Troponin:   Recent Labs  Lab 11/05/23 1600 11/05/23 2054  TROPONINIHS 4 4      Chemistry Recent Labs  Lab 11/05/23 1600  NA 138  K 4.2  CL 107  CO2 26  GLUCOSE 107*  BUN 23  CREATININE 0.95  CALCIUM  8.6*  GFRNONAA 57*  ANIONGAP 5     Hematology Recent Labs  Lab 11/05/23 1600  WBC 4.8  RBC 4.66  HGB 14.7  HCT 43.6  MCV 93.6  MCH 31.5  MCHC 33.7  RDW 13.4  PLT 217    BNPNo results for input(s): "BNP", "PROBNP" in the last 168 hours.   DDimer  Recent Labs  Lab 11/06/23 0331  DDIMER 0.58*      Radiology    DG Chest 2 View Result Date: 11/05/2023 CLINICAL DATA:  chest pain presents with complaints of left-sided chest pain that  began approximately five minutes PTA. States she was hospitalized for valve blockage two weeks ago. Pt describes the pain as sharp EXAM: CHEST - 2 VIEW COMPARISON:  Chest x-ray 05/30/2021 FINDINGS: The heart and mediastinal contours are unchanged. Atherosclerotic plaque. Biapical pleural/pulmonary scarring. Left base airspace opacity. No pulmonary edema. No pleural effusion. No pneumothorax. No acute osseous abnormality. IMPRESSION: 1. Left base airspace opacities that could represent a combination of atelectasis versus infection/inflammation. Followup PA and lateral chest X-ray is recommended in 3-4 weeks following therapy to ensure resolution and exclude underlying malignancy. 2.  Aortic Atherosclerosis (ICD10-I70.0). Electronically Signed   By: Morgane  Naveau M.D.   On: 11/05/2023 17:27    Patient Profile     89 y.o. female with a history of hypertension, hyperlipidemia, CIDP, seizure disorder, COPD and remote tobacco use who is  being seen for evaluation of chest pain at the request of Dr. Lewis Red.  Assessment & Plan    #Atypical/pleuritic chest pain #Hyperlipidemia -Patient presented with mild pleuritic chest pain worse with taking a deep breath in - Suspect that her pain is due to pleurisy - There was concerned that being on chronic steroids could mask the typical features of angina and she is scheduled for a Lexiscan Myoview today - Coronary CTA was not chosen because she has a severe contrast allergy with history of frank anaphylactic shock - 2D echo pending - Continue atorvastatin  40 mg daily  #Hypertension - BP controlled on exam this a.m. but elevated overnight - Started on amlodipine  10 mg daily   For questions or updates, please contact [facility] Please consult [email or web address] for contact info under        Signed, Gaylyn Keas, MD  11/06/2023, 7:55 AM

## 2023-11-06 NOTE — H&P (Signed)
 History and Physical    [name] UXL:[id number] DOB: 1932-08-30 DOA: 11/05/2023  PCP: [name], MD  Chief Complaint: Chest pain  HPI: [name] is a 89 y.o. female with medical history significant of asthma-COPD, CIDP, hypertension, hyperlipidemia, hypothyroidism, remote history of PE and currently not on anticoagulation, chronic constipation, osteoporosis, gastroparesis, GERD presenting with a chief complaint of chest pain.  Patient states today around 1 PM in the afternoon she was sitting when all of a sudden she started having severe sharp nonradiating left-sided chest pain.  She was not having any nausea, vomiting, or shortness of breath.  She has continued to have intermittent episodes severe sharp of left-sided chest pain every 15 to 20 minutes and pain is worse when taking deep breaths.  She denies history of exertional chest pain in the past.  Denies fevers or cough.  She reports history of PE about 20 or 30 years ago when she was in [place]  and currently not on anticoagulation.  Denies any leg pain or swelling.  Denies abdominal pain.  No other complaints.  ED Course: Patient received aspirin 324 mg and 1 nitroglycerin by EMS.  Vital signs on arrival: Temperature 98.8 F, pulse 92, respiratory rate 16, blood pressure 147/82, and SpO2 97% on room air.  Troponin negative x 2.  EKG showing sinus rhythm, LAFB, and no acute ischemic changes.  Chest x-ray showing left basilar airspace opacities suspicious for atelectasis versus infection/inflammation.  Cardiology saw the patient and ordered echocardiogram and nuclear medicine stress test.  Review of Systems:  Review of Systems  All other systems reviewed and are negative.   Past Medical History:  Diagnosis Date   Allergic rhinitis    Anemia    Arthritis    Asthma -COPD    Blood transfusion 1957   Breast mass    right breast in milk duct, bx neg   Chronic fatigue syndrome    Chronic inflammatory  demyelinating polyneuropathy (HCC) 03/2011   chronic sinusitis 08/08/2012   Colon polyp    Constipation    COPD (chronic obstructive pulmonary disease) (HCC)    Cystocele 09/16/2012   GERD (gastroesophageal reflux disease)    History of kidney stones    Hyperlipidemia    Hypertension    Hypothyroid    Neurogenic bladder 2013   husband caths pt, manages with timed void   Osteoporosis    Pulmonary embolism (HCC)    1980s   Raynaud phenomenon    Renal cyst    Non-complex, contrast MRI 2013 with L>R non-complex cysts, dominant 3.7 left lower pole   Seizures (HCC)    1990 last seizures on meds Phenobarb   Shingles late 90's   Skin cancer    squamous cell   Thyroid  nodule    Right thyroid  lobe, only seen on Sagittal imaging measures 2.3 cm in craniocaudal dimension and appears stable   Tubular adenoma    Vitamin D  deficiency     Past Surgical History:  Procedure Laterality Date   ABDOMINAL HYSTERECTOMY     ANTERIOR FUSION CLIVUS-C2 EXTRAORAL W/ ODONTOID EXCISION  12/2012   APPENDECTOMY     ARTHROSCOPIC REPAIR ACL Left 1976   BASAL CELL CARCINOMA EXCISION     face   BLADDER SUSPENSION     BREAST DUCTAL SYSTEM EXCISION Right 01/15/2014   Procedure: EXCISION DUCTAL SYSTEM RIGHT BREAST;  Surgeon: Lockie Rima, MD;  Location: [facility];  Service: General;  Laterality: Right;   BREAST EXCISIONAL BIOPSY  Right    CARPAL TUNNEL RELEASE Bilateral    B, mult times   CATARACT EXTRACTION     bilateral   cervical neck ablation     x 7, C3-C6/3 screws and plate   CESAREAN SECTION     CYSTOCELE REPAIR     CYSTOSCOPY/URETEROSCOPY/HOLMIUM LASER/STENT PLACEMENT Right 09/19/2023   Procedure: CYSTOSCOPY/URETEROSCOPY/BASKETING OF STONES;  Surgeon: Melody Spurling., MD;  Location: [facility];  Service: Urology;  Laterality: Right;   DILATION AND CURETTAGE OF UTERUS     duptyren's contracture right hand     FINGER ARTHRODESIS Right 05/06/2015   Procedure: RIGHT RING PROXIMAL  INTERPHALANGEAL FUSION (PIP);  Surgeon: Brunilda Capra, MD;  Location: [facility];  Service: Orthopedics;  Laterality: Right;   FINGER GANGLION CYST EXCISION     right   HEMICOLECTOMY Right    JOINT REPLACEMENT     right and left basal joints of thumbs   left finger fusion     3 fingers on left hand/one finger right   left ovary and tube removed     NASAL POLYP SURGERY     4 sinus surgeries   NASAL SEPTUM SURGERY     PANNICULECTOMY     PROXIMAL INTERPHALANGEAL FUSION (PIP) Left 09/09/2013   Procedure: FUSION LEFT INDEX PROXIMAL INTERPHALANGEAL JOINT (PIP);  Surgeon: Amelie Baize., MD;  Location: [facility];  Service: Orthopedics;  Laterality: Left;   right median nerve decompression     SHOULDER ARTHROSCOPY     x2 left, 1 right   sinus surgeries     x 4   squamous lesions removed     neck and face   STERIOD INJECTION Right 05/06/2015   Procedure: STEROID INJECTION;  Surgeon: Brunilda Capra, MD;  Location: [facility];  Service: Orthopedics;  Laterality: Right;  Right Index Finger Proximal InterPhalangeal Joint Injection   TONSILLECTOMY AND ADENOIDECTOMY     TUBAL LIGATION     vocal polyps removed       reports that she quit smoking about 45 years ago. Her smoking use included cigarettes. She started smoking about 48 years ago. She has a 1.5 pack-year smoking history. She has never used smokeless tobacco. She reports that she does not drink alcohol  and does not use drugs.  Allergies  Allergen Reactions   Iodinated Contrast Media Anaphylaxis    Cardiac arrest   Iodine Other (See Comments)    Cardiac arrest As young adult, received iodinated contrast for IVP, went into cardiac arrest, was resuscitated and hospitalized. Has not received iodinated contrast since that episode. Reports no adverse reaction to betadine.    Metrizamide Other (See Comments)    Cardiac arrest     Morphine Other (See Comments)    Cardiac arrest Patient has  previously tolerated hydrocodone, hydromorphone  and fentanyl     Morphine And Codeine Other (See Comments)    Cardiac arrest   Cocklebur    [place]    Mugwort    Sheep Sorrel    Spiny Pigweed (Amaranthus Spinosus) Skin Test    Tramadol  Other (See Comments)    Causing severe constipation     Family History  Problem Relation Age of Onset   Heart disease Mother    Hyperlipidemia Mother        M, aunt   Colon cancer Father        49   Ovarian cancer Sister 65 - 23   Lung cancer Sister  lung - stage 1   Thyroid  cancer Sister    Skin cancer Daughter    Malignant hyperthermia Cousin    Stomach cancer Other        first cousin   Breast cancer Neg Hx    Esophageal cancer Neg Hx    Pancreatic cancer Neg Hx     Prior to Admission medications   Medication Sig Start Date End Date Taking? Authorizing Provider  albuterol  (PROVENTIL ) (2.5 MG/3ML) 0.083% nebulizer solution Take 2.5 mg by nebulization every 4 (four) hours as needed for shortness of breath or wheezing. 02/24/19  Yes [provider]  amLODipine  (NORVASC ) 10 MG tablet TAKE 1 TABLET(10 MG) BY MOUTH DAILY 04/01/23  Yes [name], MD  arformoterol  (BROVANA ) 15 MCG/2ML NEBU Take 15 mcg by nebulization in the morning and at bedtime. 08/08/21  Yes [provider]  atorvastatin  (LIPITOR) 40 MG tablet TAKE 1 TABLET BY MOUTH EVERY NIGHT AT BEDTIME 02/16/23  Yes [name], MD  budesonide  (PULMICORT ) 0.25 MG/2ML nebulizer solution Take 0.25 mg by nebulization in the morning and at bedtime. 07/22/21  Yes [provider]  CALCIUM  PO Take 600 mg by mouth daily.   Yes [provider]  carbamazepine  (TEGRETOL  XR) 100 MG 12 hr tablet Take 200 mg by mouth at bedtime. 01/16/23 01/24/24 Yes [provider]  Chlorpheniramine Maleate (ALLERGY PO) Take 2 tablets by mouth at bedtime. Takes 2 [facility] Pills at night   Yes [provider]  Cholecalciferol 25 MCG (1000 UT)  capsule Take 1,000 Units by mouth daily.   Yes [provider]  Docusate Sodium  (COLACE PO) Take 1 tablet by mouth daily.   Yes [provider]  gabapentin  (NEURONTIN ) 300 MG capsule Take 300 mg by mouth at bedtime. 07/21/19  Yes [provider]  levothyroxine  (SYNTHROID ) 50 MCG tablet TAKE 1 TABLET(50 MCG) BY MOUTH DAILY BEFORE BREAKFAST Patient taking differently: Take 50 mcg by mouth daily. 12/22/22  Yes [name], MD  mometasone  (NASONEX ) 50 MCG/ACT nasal spray Place 2 sprays into the nose in the morning and at bedtime.   Yes [provider]  montelukast  (SINGULAIR ) 10 MG tablet TAKE 1 TABLET(10 MG) BY MOUTH AT BEDTIME 03/22/23  Yes [name], MD  ondansetron  (ZOFRAN ) 4 MG tablet Take 4 mg by mouth every 8 (eight) hours as needed for nausea. 09/20/23  Yes [provider]  PHENobarbital  (LUMINAL) 32.4 MG tablet TAKE 1 TABLET BY MOUTH AT 6 AM AND 2 TABLETS BY MOUTH AT 8 PM. 06/11/23  Yes [name], MD  predniSONE  (DELTASONE ) 5 MG tablet Take 10 mg by mouth daily. 01/12/23  Yes [provider]  VENTOLIN  HFA 108 (90 Base) MCG/ACT inhaler Inhale 1-2 puffs into the lungs every 4 (four) hours as needed for wheezing or shortness of breath. 06/10/19  Yes [provider]  NONFORMULARY OR COMPOUNDED ITEM Take 1 Inhalation by mouth daily. Mometasone /levofloxacin compounded inhalation Patient not taking: Reported on 11/05/2023    [provider]    Physical Exam: Vitals:   11/06/23 0015 11/06/23 0030 11/06/23 0045 11/06/23 0200  BP: (!) 150/84 (!) 159/86 (!) 168/86   Pulse: 86 88 85 88  Resp: 14 14 15 20   Temp:    98.5 F (36.9 C)  TempSrc:      SpO2: 97% 98% 98% 99%    Physical Exam Vitals reviewed.  Constitutional:      General: She is not in acute distress. HENT:  Head: Normocephalic and atraumatic.  Eyes:     Extraocular Movements: Extraocular movements intact.  Cardiovascular:     Rate and Rhythm:  Regular rhythm. Tachycardia present.     Pulses: Normal pulses.     Comments: Mildly tachycardic Pulmonary:     Effort: Pulmonary effort is normal. No respiratory distress.     Breath sounds: Normal breath sounds. No wheezing or rales.  Abdominal:     General: Bowel sounds are normal. There is no distension.     Palpations: Abdomen is soft.     Tenderness: There is no abdominal tenderness. There is no guarding.  Musculoskeletal:     Cervical back: Normal range of motion.     Right lower leg: No edema.     Left lower leg: No edema.  Skin:    General: Skin is warm and dry.  Neurological:     General: No focal deficit present.     Mental Status: She is alert and oriented to person, place, and time.     Labs on Admission: I have personally reviewed following labs and imaging studies  CBC: Recent Labs  Lab 11/05/23 1600  WBC 4.8  HGB 14.7  HCT 43.6  MCV 93.6  PLT 217   Basic Metabolic Panel: Recent Labs  Lab 11/05/23 1600  NA 138  K 4.2  CL 107  CO2 26  GLUCOSE 107*  BUN 23  CREATININE 0.95  CALCIUM  8.6*   GFR: Estimated Creatinine Clearance: 34.4 mL/min (by C-G formula based on SCr of 0.95 mg/dL). Liver Function Tests: No results for input(s): "AST", "ALT", "ALKPHOS", "BILITOT", "PROT", "ALBUMIN" in the last 168 hours. No results for input(s): "LIPASE", "AMYLASE" in the last 168 hours. No results for input(s): "AMMONIA" in the last 168 hours. Coagulation Profile: No results for input(s): "INR", "PROTIME" in the last 168 hours. Cardiac Enzymes: No results for input(s): "CKTOTAL", "CKMB", "CKMBINDEX", "TROPONINI" in the last 168 hours. BNP (last 3 results) No results for input(s): "PROBNP" in the last 8760 hours. HbA1C: No results for input(s): "HGBA1C" in the last 72 hours. CBG: No results for input(s): "GLUCAP" in the last 168 hours. Lipid Profile: No results for input(s): "CHOL", "HDL", "LDLCALC", "TRIG", "CHOLHDL", "LDLDIRECT" in the last 72  hours. Thyroid  Function Tests: No results for input(s): "TSH", "T4TOTAL", "FREET4", "T3FREE", "THYROIDAB" in the last 72 hours. Anemia Panel: No results for input(s): "VITAMINB12", "FOLATE", "FERRITIN", "TIBC", "IRON", "RETICCTPCT" in the last 72 hours. Urine analysis:    Component Value Date/Time   COLORURINE STRAW (A) 09/22/2023 1110   APPEARANCEUR CLEAR 09/22/2023 1110   LABSPEC 1.003 (L) 09/22/2023 1110   PHURINE 7.0 09/22/2023 1110   GLUCOSEU NEGATIVE 09/22/2023 1110   HGBUR LARGE (A) 09/22/2023 1110   BILIRUBINUR NEGATIVE 09/22/2023 1110   KETONESUR 20 (A) 09/22/2023 1110   PROTEINUR NEGATIVE 09/22/2023 1110   UROBILINOGEN 0.2 12/06/2012 1917   NITRITE NEGATIVE 09/22/2023 1110   LEUKOCYTESUR SMALL (A) 09/22/2023 1110    Radiological Exams on Admission: DG Chest 2 View Result Date: 11/05/2023 CLINICAL DATA:  chest pain presents with complaints of left-sided chest pain that began approximately five minutes PTA. States she was hospitalized for valve blockage two weeks ago. Pt describes the pain as sharp EXAM: CHEST - 2 VIEW COMPARISON:  Chest x-ray 05/30/2021 FINDINGS: The heart and mediastinal contours are unchanged. Atherosclerotic plaque. Biapical pleural/pulmonary scarring. Left base airspace opacity. No pulmonary edema. No pleural effusion. No pneumothorax. No acute osseous abnormality. IMPRESSION: 1. Left base airspace opacities that could  represent a combination of atelectasis versus infection/inflammation. Followup PA and lateral chest X-ray is recommended in 3-4 weeks following therapy to ensure resolution and exclude underlying malignancy. 2.  Aortic Atherosclerosis (ICD10-I70.0). Electronically Signed   By: Morgane  Naveau M.D.   On: 11/05/2023 17:27    Assessment and Plan  Chest pain EKG without acute ischemic changes and troponin negative x 2.  Chest x-ray showing left basilar airspace opacities, likely due to atelectasis rather than infection given no fever or  leukocytosis.  Check procalcitonin level.  Although not hypoxic, does have history of previous PE and reporting pleuritic chest pain.  Currently slightly tachycardic.  Check D-dimer level and if elevated start IV heparin and obtain VQ scan in the morning as she is allergic to IV contrast.  Cardiology consulting and ordered echocardiogram and nuclear medicine stress test.  Asthma-COPD Stable, no signs of acute exacerbation.  Continue Brovana , Pulmicort , and Singulair .  Xopenex PRN.  Hypertension Continue amlodipine .  Hyperlipidemia Lipid panel ordered.  Continue Lipitor.  Hypothyroidism Continue Synthroid .  CIDP Continue home medications.  DVT prophylaxis: Lovenox  Code Status: Full Code (discussed with the patient) Consults called: Cardiology Level of care: Progressive Care Unit Admission status: It is my clinical opinion that referral for OBSERVATION is reasonable and necessary in this patient based on the above information provided. The aforementioned taken together are felt to place the patient at high risk for further clinical deterioration. However, it is anticipated that the patient may be medically stable for discharge from the hospital within 24 to 48 hours.  Juliette Oh MD [facility]  If 7PM-7AM, please contact night-coverage [email or web address]  11/06/2023, 2:42 AM

## 2023-11-06 NOTE — Assessment & Plan Note (Signed)
 11-06-2023 continue lipitor   Lipid Panel: Lab Results  Component Value Date/Time   CHOL 235 (H) 06/29/2022 11:04 AM   TRIG 114.0 06/29/2022 11:04 AM   HDL 74.30 06/29/2022 11:04 AM   CHOLHDL 3 06/29/2022 11:04 AM   VLDL 22.8 06/29/2022 11:04 AM   LDLCALC 138 (H) 06/29/2022 11:04 AM

## 2023-11-06 NOTE — ED Notes (Signed)
 Patient will not go to floor until stress test read-may go home

## 2023-11-06 NOTE — ED Notes (Addendum)
Pt transported to nuc med  

## 2023-11-06 NOTE — Care Management Obs Status (Signed)
 MEDICARE OBSERVATION STATUS NOTIFICATION   Patient Details  Name: [name] MRN: [id number] Date of Birth: 02/01/1933   Medicare Observation Status Notification Given:  Yes    Joanette Moynahan, RN 11/06/2023, 3:15 PM

## 2023-11-06 NOTE — Discharge Summary (Signed)
 [facility] Phys[facility] Discharge Summary   Patient name: [name]  Admit date:     11/05/2023  Discharge date: 11/06/2023  Attending Physician: Juliette Oh [[id number]]  Discharge Physician: Unk Garb   PCP: [name], MD  Admitted From: Home  Disposition:  Home  Recommendations for Outpatient Follow-up:  Follow up with PCP in 1-2 weeks  Home Health:No Equipment/Devices: None    Discharge Condition:Stable CODE STATUS:FULL Diet recommendation: Heart Healthy Fluid Restriction: None  Hospital Summary: HPI: [name] is a 89 y.o. female with medical history significant of asthma-COPD, CIDP, hypertension, hyperlipidemia, hypothyroidism, remote history of PE and currently not on anticoagulation, chronic constipation, osteoporosis, gastroparesis, GERD presenting with a chief complaint of chest pain.  Patient states today around 1 PM in the afternoon she was sitting when all of a sudden she started having severe sharp nonradiating left-sided chest pain.  She was not having any nausea, vomiting, or shortness of breath.  She has continued to have intermittent episodes severe sharp of left-sided chest pain every 15 to 20 minutes and pain is worse when taking deep breaths.  She denies history of exertional chest pain in the past.  Denies fevers or cough.  She reports history of PE about 20 or 30 years ago when she was in [place]  and currently not on anticoagulation.  Denies any leg pain or swelling.  Denies abdominal pain.  No other complaints.   ED Course: Patient received aspirin 324 mg and 1 nitroglycerin by EMS.  Vital signs on arrival: Temperature 98.8 F, pulse 92, respiratory rate 16, blood pressure 147/82, and SpO2 97% on room air.  Troponin negative x 2.  EKG showing sinus rhythm, LAFB, and no acute ischemic changes.  Chest x-ray showing left basilar airspace opacities suspicious for atelectasis versus infection/inflammation.  Cardiology saw the  patient and ordered echocardiogram and nuclear medicine stress test.  Significant Events: Admitted 11/05/2023 for chest pain   Admission Labs: WBC 4.8, HgB 14.7, plt 217 Na 138, k 4.2, CO2 of 26, BUN 23, Scr 0.95, glu 107 Trop 4 -> 4 Procal < 0.10  Admission Imaging Studies: CXR Left base airspace opacities that could represent a combination of atelectasis versus infection/inflammation. Followup PA and lateral chest X-ray is recommended in 3-4 weeks following therapy to ensure resolution and exclude underlying malignancy. 2.  Aortic Atherosclerosis  Significant Labs:   Significant Imaging Studies:   Antibiotic Therapy: Anti-infectives (From admission, onward)    None       Procedures: Nuclear stress test  Consultants: cardiology   Hospital Course by Problem: * Non-cardiac chest pain 11-06-2023 cards consulted. Had normal echo at [facility] 10[facility]25-2023. Nuclear stress test negative. 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 Ok to take up to 4000 mg of tylenol  per day. Discussed with pt and husband. Safe for discharge today.  Acquired hypothyroidism 11-06-2023 continue synthroid   Essential hypertension 11-06-2023 continue norvasc   Hyperlipidemia 11-06-2023 continue lipitor   Lipid Panel: Lab Results  Component Value Date/Time   CHOL 235 (H) 06/29/2022 11:04 AM   TRIG 114.0 06/29/2022 11:04 AM   HDL 74.30 06/29/2022 11:04 AM   CHOLHDL 3 06/29/2022 11:04 AM   VLDL 22.8 06/29/2022 11:04 AM   LDLCALC 138 (H) 06/29/2022 11:04 AM     COPD (chronic obstructive pulmonary disease) gold stage B Stable. Not exacerbated.    Discharge Diagnoses:  Principal Problem:   Non-cardiac chest pain Active Problems:   COPD (chronic obstructive pulmonary disease) gold stage B  Hyperlipidemia   Essential hypertension   Acquired hypothyroidism   Discharge Instructions  Discharge Instructions     Call MD for:  difficulty breathing, headache or visual disturbances    Complete by: As directed    Call MD for:  extreme fatigue   Complete by: As directed    Call MD for:  hives   Complete by: As directed    Call MD for:  persistant dizziness or light-headedness   Complete by: As directed    Call MD for:  persistant nausea and vomiting   Complete by: As directed    Call MD for:  redness, tenderness, or signs of infection (pain, swelling, redness, odor or green/yellow discharge around incision site)   Complete by: As directed    Call MD for:  severe uncontrolled pain   Complete by: As directed    Call MD for:  temperature >100.4   Complete by: As directed    Diet - low sodium heart healthy   Complete by: As directed    Discharge instructions   Complete by: As directed    1. Follow up with your primary care provider in 1-2 weeks following discharge from hospital.   Increase activity slowly   Complete by: As directed       Allergies as of 11/06/2023       Reactions   Iodinated Contrast Media Anaphylaxis   Cardiac arrest   Iodine Other (See Comments)   Cardiac arrest As young adult, received iodinated contrast for IVP, went into cardiac arrest, was resuscitated and hospitalized. Has not received iodinated contrast since that episode. Reports no adverse reaction to betadine.    Metrizamide Other (See Comments)   Cardiac arrest   Morphine Other (See Comments)   Cardiac arrest Patient has previously tolerated hydrocodone, hydromorphone  and fentanyl    Morphine And Codeine Other (See Comments)   Cardiac arrest   Cocklebur    Lambs Quarters    Mugwort    Sheep Sorrel    Spiny Pigweed (amaranthus Spinosus) Skin Test    Tramadol  Other (See Comments)   Causing severe constipation         Medication List     TAKE these medications    ALLERGY PO Take 2 tablets by mouth at bedtime. Takes 2 [place] Allergy Pills at night   amLODipine  10 MG tablet Commonly known as: NORVASC  TAKE 1 TABLET(10 MG) BY MOUTH DAILY   arformoterol  15 MCG/2ML  [place] Commonly known as: BROVANA  Take 15 mcg by nebulization in the morning and at bedtime.   atorvastatin  40 MG tablet Commonly known as: LIPITOR TAKE 1 TABLET BY MOUTH EVERY NIGHT AT BEDTIME   budesonide  0.25 MG/2ML nebulizer solution Commonly known as: PULMICORT  Take 0.25 mg by nebulization in the morning and at bedtime.   CALCIUM  PO Take 600 mg by mouth daily.   carbamazepine  100 MG 12 hr tablet Commonly known as: TEGRETOL  XR Take 200 mg by mouth at bedtime.   Cholecalciferol 25 MCG (1000 UT) capsule Take 1,000 Units by mouth daily.   COLACE PO Take 1 tablet by mouth daily.   gabapentin  300 MG capsule Commonly known as: NEURONTIN  Take 300 mg by mouth at bedtime.   levothyroxine  50 MCG tablet Commonly known as: SYNTHROID  TAKE 1 TABLET(50 MCG) BY MOUTH DAILY BEFORE BREAKFAST What changed: See the new instructions.   mometasone  50 MCG/ACT nasal spray Commonly known as: NASONEX  Place 2 sprays into the nose in the morning and at bedtime.   montelukast  10  MG tablet Commonly known as: SINGULAIR  TAKE 1 TABLET(10 MG) BY MOUTH AT BEDTIME   NONFORMULARY OR COMPOUNDED ITEM Take 1 Inhalation by mouth daily. Mometasone /levofloxacin compounded inhalation   ondansetron  4 MG tablet Commonly known as: ZOFRAN  Take 4 mg by mouth every 8 (eight) hours as needed for nausea.   PHENobarbital  32.4 MG tablet Commonly known as: LUMINAL TAKE 1 TABLET BY MOUTH AT 6 AM AND 2 TABLETS BY MOUTH AT 8 PM.   predniSONE  5 MG tablet Commonly known as: DELTASONE  Take 10 mg by mouth daily.   albuterol  (2.5 MG/3ML) 0.083% nebulizer solution Commonly known as: PROVENTIL  Take 2.5 mg by nebulization every 4 (four) hours as needed for shortness of breath or wheezing.   Ventolin  HFA 108 (90 Base) MCG/ACT inhaler Generic drug: albuterol  Inhale 1-2 puffs into the lungs every 4 (four) hours as needed for wheezing or shortness of breath.        Allergies  Allergen Reactions   Iodinated  Contrast Media Anaphylaxis    Cardiac arrest   Iodine Other (See Comments)    Cardiac arrest As young adult, received iodinated contrast for IVP, went into cardiac arrest, was resuscitated and hospitalized. Has not received iodinated contrast since that episode. Reports no adverse reaction to betadine.    Metrizamide Other (See Comments)    Cardiac arrest     Morphine Other (See Comments)    Cardiac arrest Patient has previously tolerated hydrocodone, hydromorphone  and fentanyl     Morphine And Codeine Other (See Comments)    Cardiac arrest   Cocklebur    Lambs Quarters    Mugwort    Sheep Sorrel    Spiny Pigweed (Amaranthus Spinosus) Skin Test    Tramadol  Other (See Comments)    Causing severe constipation     Discharge Exam: Vitals:   11/06/23 1400 11/06/23 1415  BP: (!) 151/67 124/64  Pulse: 95 91  Resp: (!) 25 18  Temp:    SpO2: 99% 99%    Physical Exam Vitals and nursing note reviewed.  HENT:     Head: Normocephalic and atraumatic.     Nose: Nose normal.  Eyes:     General: No scleral icterus. Cardiovascular:     Rate and Rhythm: Normal rate and regular rhythm.  Pulmonary:     Effort: Pulmonary effort is normal.  Abdominal:     General: Abdomen is flat.  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     Capillary Refill: Capillary refill takes less than 2 seconds.  Neurological:     General: No focal deficit present.     Mental Status: She is alert and oriented to person, place, and time.     The results of significant diagnostics from this hospitalization (including imaging, microbiology, ancillary and laboratory) are listed below for reference.     Labs:  Basic Metabolic Panel: Recent Labs  Lab 11/05/23 1600  NA 138  K 4.2  CL 107  CO2 26  GLUCOSE 107*  BUN 23  CREATININE 0.95  CALCIUM  8.6*   CBC: Recent Labs  Lab 11/05/23 1600  WBC 4.8  HGB 14.7  HCT 43.6  MCV  93.6  PLT 217   D-Dimer Recent Labs    11/06/23 0331  DDIMER 0.58*    Urinalysis    Component Value Date/Time   COLORURINE STRAW (A) 09/22/2023 1110   APPEARANCEUR CLEAR 09/22/2023 1110  LABSPEC 1.003 (L) 09/22/2023 1110   PHURINE 7.0 09/22/2023 1110   GLUCOSEU NEGATIVE 09/22/2023 1110   HGBUR LARGE (A) 09/22/2023 1110   BILIRUBINUR NEGATIVE 09/22/2023 1110   KETONESUR 20 (A) 09/22/2023 1110   PROTEINUR NEGATIVE 09/22/2023 1110   UROBILINOGEN 0.2 12/06/2012 1917   NITRITE NEGATIVE 09/22/2023 1110   LEUKOCYTESUR SMALL (A) 09/22/2023 1110   Sepsis Labs Recent Labs  Lab 11/05/23 1600 11/06/23 0331  PROCALCITON  --  <0.10  WBC 4.8  --    Cardiac Enzymes: Recent Labs  Lab 11/05/23 1600 11/05/23 2054  TROPONINIHS 4 4   Echo from [place] 10-25-2023  [facility]                         BAYLEI SIEBELS                              [facility]                              [id number]                                                                                DOB: 19-May-1933  Age: 67                   ECHO-DOPPLER REPORT                             Date: 10/25/2023                                                                                 Female                                                                                     Outpatient  LOCATION: [facility]                                                                             MD1: Shoshana Dowse [facility]                   STUDY: ECHO COMPLETE                             SOUND QLTY: Moderate                       ECHO: Yes                                           STRAIN: Yes                           COLOR: Yes                                               3D: Yes                         DOPPLER: Yes                                               BP:  123 / 68                  RV BIOPSY: No                                                HR: 85 BPM                     CONTRAST: No                                            Height: 63 in                        MEDIUM: N/A                                           Weight: 134 lbs                     MACHINE: CDU - E95-20  BSA: 1.6                     ------------------------------------------------------------------------------------------     History: Scleroderma      Reason: Assess RV Function Assess LV function  Indication:  M34.9- Systemic sclerosis, unspecified (CMS/HHS.                                      CONCLUSION -------------------------------------------------------------------------------  NORMAL LEFT VENTRICULAR SYSTOLIC FUNCTION WITH NO LVH  ESTIMATED EF: >55%, CALC EF(3D): 61%  INDETERMINATE DIASTOLIC FUNCTION  NORMAL RIGHT VENTRICULAR SYSTOLIC FUNCTION  VALVULAR REGURGITATION: TRIVIAL AR, MILD MR, TRIVIAL PR, TRIVIAL TR  NO VALVULAR STENOSIS    Procedures/Studies: NM Myocar Multi W/Spect W/Wall Motion / EF Result Date: 11/06/2023   The study is normal. The study is low risk.   No ST deviation was noted.   LV perfusion is normal. There is no evidence of ischemia. There is no evidence of infarction.   Left ventricular function is normal. Nuclear stress EF: 94%. The left ventricular ejection fraction is hyperdynamic (>65%). End diastolic cavity size is normal. End systolic cavity size is normal.   Coronary calcium  assessment not performed   DG Chest 2 View Result Date: 11/05/2023 CLINICAL DATA:  chest pain presents with complaints of left-sided chest pain that began approximately five minutes PTA. States she was hospitalized for valve blockage two weeks ago. Pt describes the pain as sharp EXAM: CHEST - 2 VIEW COMPARISON:  Chest x-ray 05/30/2021 FINDINGS: The heart and mediastinal contours are unchanged. Atherosclerotic plaque. Biapical  pleural/pulmonary scarring. Left base airspace opacity. No pulmonary edema. No pleural effusion. No pneumothorax. No acute osseous abnormality. IMPRESSION: 1. Left base airspace opacities that could represent a combination of atelectasis versus infection/inflammation. Followup PA and lateral chest X-ray is recommended in 3-4 weeks following therapy to ensure resolution and exclude underlying malignancy. 2.  Aortic Atherosclerosis (ICD10-I70.0). Electronically Signed   By: Morgane  Naveau M.D.   On: 11/05/2023 17:27    Time coordinating discharge: 55 mins  SIGNED:  Unk Garb, DO [facility] 11/06/23, 3:17 PM

## 2023-11-06 NOTE — Assessment & Plan Note (Signed)
 11-06-2023 continue synthroid 

## 2023-11-06 NOTE — Subjective & Objective (Signed)
 Pt seen and examined. Met with pt and husband at bedside. Discussed normal stress test. Stable for DC to home.

## 2023-11-06 NOTE — TOC Transition Note (Signed)
 Transition of Care [facility]) - Discharge Note   Patient Details  Name: [name] MRN: [id number] Date of Birth: 08-08-1932  [facility] CM/SW Contact:  Joanette Moynahan, RN Phone Number: 11/06/2023, 3:18 PM   Clinical Narrative:    Transition of Care Department [facility]) has reviewed patient and no TOC needs have been identified at this time. We will continue to monitor patient advancement through interdisciplinary progression rounds. If new patient transition needs arise, please place a TOC consult.     Final next level of care: Home/Self Care Barriers to Discharge: No Barriers Identified   Patient Goals and CMS Choice Patient states their goals for this hospitalization and ongoing recovery are:: to return home          Discharge Placement                  Name of family member notified: spouse at bedside Patient and family notified of of transfer: 11/06/23  Discharge Plan and Services Additional resources added to the After Visit Summary for                                       Social Drivers of Health (SDOH) Interventions SDOH Screenings   Food Insecurity: No Food Insecurity (09/23/2023)  Housing: Unknown (10/25/2023)   Received from [facility]  Transportation Needs: No Transportation Needs (09/23/2023)  Utilities: Not At Risk (09/23/2023)  Alcohol  Screen: Low Risk  (12/03/2019)  Depression (PHQ2-9): Medium Risk (10/16/2023)  Financial Resource Strain: Low Risk  (11/16/2022)  Physical Activity: Inactive (11/16/2022)  Social Connections: Socially Integrated (09/22/2023)  Stress: No Stress Concern Present (11/16/2022)  Tobacco Use: Medium Risk (11/06/2023)     Readmission Risk Interventions     No data to display

## 2023-11-06 NOTE — Assessment & Plan Note (Signed)
Stable. Not exacerbated. 

## 2023-11-06 NOTE — ED Provider Notes (Signed)
 [facility] Provider Note   CSN: [id number] Arrival date & time: 11/05/23  1547     History  Chief Complaint  Patient presents with   Chest Pain    [name] is a 89 y.o. female.  HPI    89 year old female comes in with chief complaint of chest pain.  Patient has history of hypertension, hyperlipidemia.  She has no known coronary artery disease.  She has history of COPD.  She has remote history of PE, in the 82s.  She is currently not on any blood thinners.  According to the patient, yesterday she had left-sided chest discomfort.  There was mild.  The pain resolved on its own.  She was fine rest of the day and overnight.  This afternoon she had sudden onset severe chest pain again.  The pain was moderate to severe, she immediately went to urgent care.  The urgent care they told her that she was in A-fib with RVR.  They called EMS.  EMS gave patient nitroglycerin, aspirin.  Patient indicates that the nitroglycerin immediately resolved her pain.  She received 162 mg of aspirin as well.  She was then brought to the ER.  Patient denies any active pain to me.  She denies any pain with deep inspiration.  Review of system negative for cough, fevers, chills, recent exertional chest pain or shortness of breath, recent cardiac workup.  Patient is not bedbound, denies any new leg pain or swelling.  She has history of COPD and at baseline not very active and has some chronic fatigue syndrome.  No active cancer history, recent long distance travels.  Family is at the bedside as well.  The husband provides significant part of the history.  Patient indicates that while in the ER, she has had dozens of chest pain that will spontaneously, and resolved.  The episode lasts for few seconds -few minutes.  Home Medications Prior to Admission medications   Medication Sig Start Date End Date Taking? Authorizing Provider  albuterol  (PROVENTIL ) (2.5  MG/3ML) 0.083% nebulizer solution Take 2.5 mg by nebulization every 4 (four) hours as needed for shortness of breath or wheezing. 02/24/19  Yes [provider]  amLODipine  (NORVASC ) 10 MG tablet TAKE 1 TABLET(10 MG) BY MOUTH DAILY 04/01/23  Yes [name], MD  arformoterol  (BROVANA ) 15 MCG/2ML NEBU Take 15 mcg by nebulization in the morning and at bedtime. 08/08/21  Yes [provider]  atorvastatin  (LIPITOR) 40 MG tablet TAKE 1 TABLET BY MOUTH EVERY NIGHT AT BEDTIME 02/16/23  Yes [name], MD  budesonide  (PULMICORT ) 0.25 MG/2ML nebulizer solution Take 0.25 mg by nebulization in the morning and at bedtime. 07/22/21  Yes [provider]  CALCIUM  PO Take 600 mg by mouth daily.   Yes [provider]  carbamazepine  (TEGRETOL  XR) 100 MG 12 hr tablet Take 200 mg by mouth at bedtime. 01/16/23 01/24/24 Yes [provider]  Chlorpheniramine Maleate (ALLERGY PO) Take 2 tablets by mouth at bedtime. Takes 2 [facility] at night   Yes [provider]  Cholecalciferol 25 MCG (1000 UT) capsule Take 1,000 Units by mouth daily.   Yes [provider]  Docusate Sodium  (COLACE PO) Take 1 tablet by mouth daily.   Yes [provider]  gabapentin  (NEURONTIN ) 300 MG capsule Take 300 mg by mouth at bedtime. 07/21/19  Yes [provider]  levothyroxine  (SYNTHROID ) 50 MCG tablet TAKE 1 TABLET(50 MCG) BY MOUTH DAILY  BEFORE BREAKFAST Patient taking differently: Take 50 mcg by mouth daily. 12/22/22  Yes [name], MD  mometasone  (NASONEX ) 50 MCG/ACT nasal spray Place 2 sprays into the nose in the morning and at bedtime.   Yes [provider]  montelukast  (SINGULAIR ) 10 MG tablet TAKE 1 TABLET(10 MG) BY MOUTH AT BEDTIME 03/22/23  Yes [name], MD  ondansetron  (ZOFRAN ) 4 MG tablet Take 4 mg by mouth every 8 (eight) hours as needed for nausea. 09/20/23  Yes [provider]  PHENobarbital  (LUMINAL) 32.4 MG  tablet TAKE 1 TABLET BY MOUTH AT 6 AM AND 2 TABLETS BY MOUTH AT 8 PM. 06/11/23  Yes [name], MD  predniSONE  (DELTASONE ) 5 MG tablet Take 10 mg by mouth daily. 01/12/23  Yes [provider]  VENTOLIN  HFA 108 (90 Base) MCG/ACT inhaler Inhale 1-2 puffs into the lungs every 4 (four) hours as needed for wheezing or shortness of breath. 06/10/19  Yes [provider]  NONFORMULARY OR COMPOUNDED ITEM Take 1 Inhalation by mouth daily. Mometasone /levofloxacin compounded inhalation Patient not taking: Reported on 11/05/2023    [provider]      Allergies    Iodinated contrast media, Iodine, Metrizamide, Morphine, Morphine and codeine, Cocklebur, Lambs quarters, Mugwort, Sheep sorrel, Spiny pigweed (amaranthus spinosus) skin test, and Tramadol     Review of Systems   Review of Systems  All other systems reviewed and are negative.   Physical Exam Updated Vital Signs BP 135/67 (BP Location: Right Arm)   Pulse 72   Temp 98.7 F (37.1 C) (Oral)   Resp 16   SpO2 100%  Physical Exam Vitals and nursing note reviewed.  Constitutional:      Appearance: She is well-developed.  HENT:     Head: Atraumatic.  Cardiovascular:     Rate and Rhythm: Normal rate.  Pulmonary:     Effort: Pulmonary effort is normal.     Breath sounds: Normal breath sounds. No decreased breath sounds, wheezing, rhonchi or rales.  Musculoskeletal:     Cervical back: Normal range of motion and neck supple.     Right lower leg: No edema.     Left lower leg: No edema.  Skin:    General: Skin is warm and dry.  Neurological:     Mental Status: She is alert and oriented to person, place, and time.     ED Results / Procedures / Treatments   Labs (all labs ordered are listed, but only abnormal results are displayed) Labs Reviewed  BASIC METABOLIC PANEL WITH GFR - Abnormal; Notable for the following components:      Result Value   Glucose, Bld 107 (*)    Calcium  8.6 (*)    GFR, Estimated 57  (*)    All other components within normal limits  CBC  LIPID PANEL  TROPONIN I (HIGH SENSITIVITY)  TROPONIN I (HIGH SENSITIVITY)    EKG None   Date: 11/06/2023  Rate: 86  Rhythm: normal sinus rhythm  QRS Axis: normal  Intervals: normal  ST/T Wave abnormalities: normal  Conduction Disutrbances: none  Narrative Interpretation: unremarkable     Radiology DG Chest 2 View Result Date: 11/05/2023 CLINICAL DATA:  chest pain presents with complaints of left-sided chest pain that began approximately five minutes PTA. States she was hospitalized for valve blockage two weeks ago. Pt describes the pain as sharp EXAM: CHEST - 2 VIEW COMPARISON:  Chest x-ray 05/30/2021 FINDINGS: The heart and mediastinal contours are unchanged. Atherosclerotic plaque.  Biapical pleural/pulmonary scarring. Left base airspace opacity. No pulmonary edema. No pleural effusion. No pneumothorax. No acute osseous abnormality. IMPRESSION: 1. Left base airspace opacities that could represent a combination of atelectasis versus infection/inflammation. Followup PA and lateral chest X-ray is recommended in 3-4 weeks following therapy to ensure resolution and exclude underlying malignancy. 2.  Aortic Atherosclerosis (ICD10-I70.0). Electronically Signed   By: Morgane  Naveau M.D.   On: 11/05/2023 17:27    Procedures Procedures    Medications Ordered in ED Medications  acetaminophen  (TYLENOL ) tablet 650 mg (has no administration in time range)  nitroGLYCERIN (NITROGLYN) 2 % ointment 1 inch (has no administration in time range)    ED Course/ Medical Decision Making/ A&P              HEART Score: 5                    Medical Decision Making Amount and/or Complexity of Data Reviewed Labs: ordered. Radiology: ordered.  Risk Prescription drug management. Decision regarding hospitalization.   This patient presents to the ED with chief complaint(s) of chest pain with pertinent past medical history of COPD,  hypertension, hyperlipidemia and remote PE currently not on anticoagulation.The complaint involves an extensive differential diagnosis and also carries with it a high risk of complications and morbidity.    The differential diagnosis considered for this patient includes  ACS syndrome Aortic dissection CHF exacerbation Valvular disorder Myocarditis Pericarditis Endocarditis Pericardial effusion / tamponade Pneumonia Pulmonary mass/tumor PE Pneumothorax Musculoskeletal pain PUD / Gastritis / Esophagitis Esophageal spasm  Patient was told that she likely has A-fib, but I have reviewed the old EKG at the urgent care.  She clearly did not have A-fib.  She even did not have tachycardia on the EKG.  No evidence of right-sided heart strain [id number], right bundle branch block.  There is some fascicular changes, conduction delay.  The initial plan is to get basic labs.  Hear score is 5.  Patient has remote history of pulmonary embolism.  Patient does not have any signs of DVT.  She denies any pleuritic chest pain during my assessment.  I have low clinical suspicion for PE, and we will not pursue CT PE at this time.   Additional history obtained: Additional history obtained from family and spouse Records reviewed previous admission documents  Independent labs interpretation:  The following labs were independently interpreted: Troponin x 2 are normal.  Independent visualization and interpretation of imaging: - I independently visualized the following imaging with scope of interpretation limited to determining acute life threatening conditions related to emergency care: X-ray of the chest, which revealed haziness in the left lower lung base.  Patient however has no cough, lung exam is normal.  We do not think patient has pneumonia.  She has COPD, she might need outpatient x-ray to ensure that the haziness is cleared, otherwise she will need CT scan to evaluate for mass.   Consultation: -  Consulted or discussed management/test interpretation with external professional: Cardiology was consulted.  Family concerned that patient's chest pain could be concerning because nitroglycerin helped her and she is now elderly, has some cardiovascular risk factors.  They have seen the patient, cardiology recommends admission.  On their assessment, patient was having chest pain and it was worse with inspiration, but then the pain resolved and was not reproduced with deep inspiration.  Pericarditis is something that brought up as a possibility.  They will get an echocardiogram and additional testing.  They request  medicine admission.     Final Clinical Impression(s) / ED Diagnoses Final diagnoses:  Nonspecific chest pain    Rx / DC Orders ED Discharge Orders     None         Deatra Face, MD 11/06/23 0017

## 2023-11-06 NOTE — ED Notes (Signed)
 Nuclear called regarding patient stress test. They will be putting in transport for her. They request patient not have the nebulizer treatments until she returns.

## 2023-11-06 NOTE — Assessment & Plan Note (Signed)
 11-06-2023 cards consulted. Had normal echo at [facility] 10-25-2023. Nuclear stress test negative. 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 Ok to take up to 4000 mg of tylenol  per day. Discussed with pt and husband. Safe for discharge today.

## 2023-11-06 NOTE — Progress Notes (Signed)
 Nuclear stress tests results reviewed:  Study Result  Narrative & Impression      The study is normal. The study is low risk.   No ST deviation was noted.   LV perfusion is normal. There is no evidence of ischemia. There is no evidence of infarction.   Left ventricular function is normal. Nuclear stress EF: 94%. The left ventricular ejection fraction is hyperdynamic (>65%). End diastolic cavity size is normal. End systolic cavity size is normal.   Coronary calcium  assessment not performed    Patient ok for discharge home.  Suspect her CP is pleurisy based on description.  Treatment per primary service. Will sign off. Call with any questions.

## 2023-11-06 NOTE — Hospital Course (Signed)
 HPI: [name] is a 89 y.o. female with medical history significant of asthma-COPD, CIDP, hypertension, hyperlipidemia, hypothyroidism, remote history of PE and currently not on anticoagulation, chronic constipation, osteoporosis, gastroparesis, GERD presenting with a chief complaint of chest pain.  Patient states today around 1 PM in the afternoon she was sitting when all of a sudden she started having severe sharp nonradiating left-sided chest pain.  She was not having any nausea, vomiting, or shortness of breath.  She has continued to have intermittent episodes severe sharp of left-sided chest pain every 15 to 20 minutes and pain is worse when taking deep breaths.  She denies history of exertional chest pain in the past.  Denies fevers or cough.  She reports history of PE about 20 or 30 years ago when she was in [place]  and currently not on anticoagulation.  Denies any leg pain or swelling.  Denies abdominal pain.  No other complaints.   ED Course: Patient received aspirin 324 mg and 1 nitroglycerin by EMS.  Vital signs on arrival: Temperature 98.8 F, pulse 92, respiratory rate 16, blood pressure 147/82, and SpO2 97% on room air.  Troponin negative x 2.  EKG showing sinus rhythm, LAFB, and no acute ischemic changes.  Chest x-ray showing left basilar airspace opacities suspicious for atelectasis versus infection/inflammation.  Cardiology saw the patient and ordered echocardiogram and nuclear medicine stress test.  Significant Events: Admitted 11/05/2023 for chest pain   Admission Labs: WBC 4.8, HgB 14.7, plt 217 Na 138, k 4.2, CO2 of 26, BUN 23, Scr 0.95, glu 107 Trop 4 -> 4 Procal < 0.10  Admission Imaging Studies: CXR Left base airspace opacities that could represent a combination of atelectasis versus infection/inflammation. Followup PA and lateral chest X-ray is recommended in 3-4 weeks following therapy to ensure resolution and exclude underlying malignancy. 2.  Aortic  Atherosclerosis  Significant Labs:   Significant Imaging Studies:   Antibiotic Therapy: Anti-infectives (From admission, onward)    None       Procedures: Nuclear stress test  Consultants: cardiology

## 2023-11-06 NOTE — ED Notes (Signed)
Pt still in nuc med. 

## 2023-11-07 ENCOUNTER — Telehealth: Payer: Self-pay

## 2023-11-07 NOTE — Transitions of Care (Post Inpatient/ED Visit) (Signed)
   11/07/2023  Name: [name] MRN: [id number] DOB: 1933/04/28  Today's TOC FU Call Status: Today's TOC FU Call Status:: Unsuccessful Call (1st Attempt) Unsuccessful Call (1st Attempt) Date: 11/07/23  Attempted to reach the patient regarding the most recent Inpatient/ED visit.  Follow Up Plan: Additional outreach attempts will be made to reach the patient to complete the Transitions of Care (Post Inpatient/ED visit) call.   Signature Darrall Ellison, LPN [facility] Nurse Health Advisor Direct Dial [phone]

## 2023-11-08 ENCOUNTER — Encounter: Payer: Self-pay | Admitting: Family Medicine

## 2023-11-08 ENCOUNTER — Ambulatory Visit: Admitting: Family Medicine

## 2023-11-08 VITALS — BP 115/59 | HR 92 | Temp 97.5°F | Resp 16 | Ht 62.0 in | Wt 128.0 lb

## 2023-11-08 DIAGNOSIS — R0789 Other chest pain: Secondary | ICD-10-CM

## 2023-11-08 DIAGNOSIS — L659 Nonscarring hair loss, unspecified: Secondary | ICD-10-CM | POA: Diagnosis not present

## 2023-11-08 DIAGNOSIS — M349 Systemic sclerosis, unspecified: Secondary | ICD-10-CM

## 2023-11-08 NOTE — Transitions of Care (Post Inpatient/ED Visit) (Signed)
   11/08/2023  Name: [name] MRN: [id number] DOB: [birth year]  Today's TOC FU Call Status: Today's TOC FU Call Status:: Unsuccessful Call (1st Attempt) Unsuccessful Call (1st Attempt) Date: 11/07/23  Attempted to reach the patient regarding the most recent Inpatient/ED visit.  Follow Up Plan: No further outreach attempts will be made at this time. We have been unable to contact the patient. Patient already seen in office Signature Darrall Ellison, LPN [facility] Nurse Health Advisor Direct Dial [phone]

## 2023-11-08 NOTE — Progress Notes (Signed)
 Subjective:     Patient ID: [name], female    DOB: March 17, 1933, 89 y.o.   MRN: [id number]  Chief Complaint  Patient presents with   Hair/Scalp Problem    Concerned with hair loss   Follow-up    Follow-up from hospital visit on left sided chest pain    HPI Discussed the use of AI scribe software for clinical note transcription with the patient, who gave verbal consent to proceed.  History of Present Illness   [name] is an 89 year old female with scleroderma who presents with hair loss and follow-up from the ER for chest pain. She is accompanied by her husband.  She experienced left-sided chest pain, constant and severe, rated as eight out of ten. The pain began the night before the ER visit, initially minor, but became constant. It resolved after she experienced diarrhea following a stress test with dye. She has a history of constipation, typically going three to four days without a bowel movement, and uses MiraLAX  for management.  She reports significant hair loss, particularly at the back of her head, which has worsened over time. Her husband notes that her hair falls out when combed and appears sparse after showering. She has been experiencing fatigue, requiring frequent naps, which is unusual for her. Her current medications include barbiturates and carbobenzamine, and she has been on vitamins, including D3, after a previous deficiency was noted. Recent blood work included thyroid , iron, and ferritin levels, which were normal.  She has a history of scleroderma, diagnosed by a rheumatologist at [facility]. She experiences discoloration between her joints and has been evaluated for Raynaud's phenomenon. Her husband mentions a rash on her legs, attributed to her medications by a dermatologist, which has not improved with topical treatments.  Socially, she is experiencing increased fatigue and requires assistance with mobility, considering the use of a wheelchair for  longer distances.       Health Maintenance Due  Topic Date Due   Medicare Annual Wellness (AWV)  11/16/2023    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   chronic sinusitis 08/08/2012   Colon polyp    Constipation    COPD (chronic obstructive pulmonary disease) (HCC)    Cystocele 09/16/2012   Fecal incontinence 10/03/2011   Follows with Dr Bridgett Camps  Rectocele noted     GERD (gastroesophageal reflux disease)    History of kidney stones    Hyperlipidemia    Hypertension    Hypothyroid    Neurogenic bladder 2013   husband caths pt, manages with timed void   Osteoporosis    Pulmonary embolism (HCC)    1980s   Raynaud phenomenon    Renal cyst    Non-complex, contrast MRI 2013 with L>R non-complex cysts, dominant 3.7 left lower pole   Seizures (HCC)    1990 last seizures on meds Phenobarb   Shingles late 90's   Skin cancer    squamous cell   Thyroid  nodule    Right thyroid  lobe, only seen on Sagittal imaging measures 2.3 cm in craniocaudal dimension and appears stable   Tubular adenoma  Vitamin D  deficiency     Past Surgical History:  Procedure Laterality Date   ABDOMINAL HYSTERECTOMY     ANTERIOR FUSION CLIVUS-C2 EXTRAORAL W/ ODONTOID EXCISION  12/2012   APPENDECTOMY     ARTHROSCOPIC REPAIR ACL Left 1976   BASAL CELL CARCINOMA EXCISION     face   BLADDER SUSPENSION     BREAST DUCTAL SYSTEM EXCISION Right 01/15/2014   Procedure: EXCISION DUCTAL SYSTEM RIGHT BREAST;  Surgeon: Lockie Rima, MD;  Location: [facility];  Service: General;  Laterality: Right;   BREAST EXCISIONAL BIOPSY Right    CARPAL TUNNEL RELEASE Bilateral    B, mult times   CATARACT  EXTRACTION     bilateral   cervical neck ablation     x 7, C3-C6/3 screws and plate   CESAREAN SECTION     CYSTOCELE REPAIR     CYSTOSCOPY/URETEROSCOPY/HOLMIUM LASER/STENT PLACEMENT Right 09/19/2023   Procedure: CYSTOSCOPY/URETEROSCOPY/BASKETING OF STONES;  Surgeon: Melody Spurling., MD;  Location: [facility];  Service: Urology;  Laterality: Right;   DILATION AND CURETTAGE OF UTERUS     duptyren's contracture right hand     FINGER ARTHRODESIS Right 05/06/2015   Procedure: RIGHT RING PROXIMAL INTERPHALANGEAL FUSION (PIP);  Surgeon: Brunilda Capra, MD;  Location: [facility];  Service: Orthopedics;  Laterality: Right;   FINGER GANGLION CYST EXCISION     right   HEMICOLECTOMY Right    JOINT REPLACEMENT     right and left basal joints of thumbs   left finger fusion     3 fingers on left hand/one finger right   left ovary and tube removed     NASAL POLYP SURGERY     4 sinus surgeries   NASAL SEPTUM SURGERY     PANNICULECTOMY     PROXIMAL INTERPHALANGEAL FUSION (PIP) Left 09/09/2013   Procedure: FUSION LEFT INDEX PROXIMAL INTERPHALANGEAL JOINT (PIP);  Surgeon: Amelie Baize., MD;  Location: [facility];  Service: Orthopedics;  Laterality: Left;   right median nerve decompression     SHOULDER ARTHROSCOPY     x2 left, 1 right   sinus surgeries     x 4   squamous lesions removed     neck and face   STERIOD INJECTION Right 05/06/2015   Procedure: STEROID INJECTION;  Surgeon: Brunilda Capra, MD;  Location: [facility];  Service: Orthopedics;  Laterality: Right;  Right Index Finger Proximal InterPhalangeal Joint Injection   TONSILLECTOMY AND ADENOIDECTOMY     TUBAL LIGATION     vocal polyps removed       Current Outpatient Medications:    albuterol  (PROVENTIL ) (2.5 MG/3ML) 0.083% nebulizer solution, Take 2.5 mg by nebulization every 4 (four) hours as needed for shortness of breath or wheezing., Disp: , Rfl:    amLODipine  (NORVASC ) 10 MG tablet,  TAKE 1 TABLET(10 MG) BY MOUTH DAILY, Disp: 90 tablet, Rfl: 3   arformoterol  (BROVANA ) 15 MCG/2ML NEBU, Take 15 mcg by nebulization in the morning and at bedtime., Disp: , Rfl:    atorvastatin  (LIPITOR) 40 MG tablet, TAKE 1 TABLET BY MOUTH EVERY NIGHT AT BEDTIME, Disp: 90 tablet, Rfl: 3   budesonide  (PULMICORT ) 0.25 MG/2ML nebulizer solution, Take 0.25 mg by nebulization in the morning and at bedtime., Disp: , Rfl:    CALCIUM  PO, Take 600 mg by mouth daily., Disp: , Rfl:    carbamazepine  (TEGRETOL  XR) 100 MG 12 hr tablet, Take 200 mg by mouth at bedtime., Disp: ,  Rfl:    Chlorpheniramine Maleate (ALLERGY PO), Take 2 tablets by mouth at bedtime. Takes 2 [place] Allergy Pills at night, Disp: , Rfl:    Cholecalciferol 25 MCG (1000 UT) capsule, Take 1,000 Units by mouth daily., Disp: , Rfl:    Docusate Sodium  (COLACE PO), Take 1 tablet by mouth daily., Disp: , Rfl:    gabapentin  (NEURONTIN ) 300 MG capsule, Take 300 mg by mouth at bedtime., Disp: , Rfl:    levothyroxine  (SYNTHROID ) 50 MCG tablet, TAKE 1 TABLET(50 MCG) BY MOUTH DAILY BEFORE BREAKFAST (Patient taking differently: Take 50 mcg by mouth daily.), Disp: 90 tablet, Rfl: 3   mometasone  (NASONEX ) 50 MCG/ACT nasal spray, Place 2 sprays into the nose in the morning and at bedtime., Disp: , Rfl:    montelukast  (SINGULAIR ) 10 MG tablet, TAKE 1 TABLET(10 MG) BY MOUTH AT BEDTIME, Disp: 90 tablet, Rfl: 3   NONFORMULARY OR COMPOUNDED ITEM, Take 1 Inhalation by mouth daily. Mometasone /levofloxacin compounded inhalation, Disp: , Rfl:    ondansetron  (ZOFRAN ) 4 MG tablet, Take 4 mg by mouth every 8 (eight) hours as needed for nausea., Disp: , Rfl:    PHENobarbital  (LUMINAL) 32.4 MG tablet, TAKE 1 TABLET BY MOUTH AT 6 AM AND 2 TABLETS BY MOUTH AT 8 PM., Disp: 270 tablet, Rfl: 1   predniSONE  (DELTASONE ) 5 MG tablet, Take 10 mg by mouth daily., Disp: , Rfl:    VENTOLIN  HFA 108 (90 Base) MCG/ACT inhaler, Inhale 1-2 puffs into the lungs every 4 (four) hours as  needed for wheezing or shortness of breath., Disp: , Rfl:   Current Facility-Administered Medications:    Romosozumab -aqqg (EVENITY ) 105 MG/1. injection 210 mg, 210 mg, Subcutaneous, Once, [name], MD   [START ON 11/15/2023] Romosozumab -aqqg (EVENITY ) 105 MG/1. injection 210 mg, 210 mg, Subcutaneous, Q30 days, [name], MD  Allergies  Allergen Reactions   Iodinated Contrast Media Anaphylaxis    Cardiac arrest   Iodine Other (See Comments)    Cardiac arrest As young adult, received iodinated contrast for IVP, went into cardiac arrest, was resuscitated and hospitalized. Has not received iodinated contrast since that episode. Reports no adverse reaction to betadine.    Metrizamide Other (See Comments)    Cardiac arrest     Morphine Other (See Comments)    Cardiac arrest Patient has previously tolerated hydrocodone, hydromorphone  and fentanyl     Morphine And Codeine Other (See Comments)    Cardiac arrest   Cocklebur    [place]    Mugwort    Sheep Sorrel    Spiny Pigweed (Amaranthus Spinosus) Skin Test    Tramadol  Other (See Comments)    Causing severe constipation    ROS neg/noncontributory except as noted HPI/below      Objective:     BP (!) 115/59   Pulse 92   Temp (!) 97.5 F (36.4 C) (Temporal)   Resp 16   Ht 5' 2 (1.575 m)   Wt 128 lb (58.1 kg)   SpO2 95%   BMI 23.41 kg/m  Wt Readings from Last 3 Encounters:  11/08/23 128 lb (58.1 kg)  11/06/23 132 lb 15.7 oz (60.3 kg)  11/05/23 133 lb (60.3 kg)    Physical Exam   Gen: WDWN NAD HEENT: NCAT, conjunctiva not injected, sclera nonicteric NECK:  supple, no thyromegaly, no nodes, no carotid bruits CARDIAC: RRR, S1S2+, no murmur. DP 2+B LUNGS: CTAB. No wheezes ABDOMEN:  BS+, soft, NTND, No HSM, no masses EXT:  no edema BJY:NWGNFA NEURO:  A&O x3.  CN II-XII intact.  PSYCH: normal mood. Good eye contact  Hair is thinning all over  Reviewed er notes and [facility]      Assessment & Plan:  Alopecia  Scleroderma (HCC)  Non-cardiac chest pain  Assessment and Plan    Chest Pain   Intermittent left-sided chest pain initially suggested atrial fibrillation on EKG, but subsequent normal EKGs and cardiologist disagreement ruled out cardiac issues. Pain rated 8/10, relieved by diarrhea post-stress test with dye. Differential includes gastrointestinal and musculoskeletal causes.  Constipation   Chronic constipation managed with MiraLAX  contributes to abdominal discomfort and bloating. A recent large bowel movement followed by pain suggests gastrointestinal involvement.  Hair Loss   Significant hair loss may be due to medication side effects, nutritional deficiencies, or scleroderma. Normal thyroid , iron, ferritin, and B12 levels. Scleroderma-related hair loss is due to skin fibrosis affecting hair follicles. Follow up with [facility] and consider using Rogaine for management.  Scleroderma   Scleroderma may contribute to hair loss and fatigue. No current medications for scleroderma. Further evaluation by rheumatology is needed. Follow up with [facility] for management.  Fatigue   Persistent fatigue with increased need for naps. Normal thyroid  function tests. Fatigue possibly related to age, medication side effects, or scleroderma.  Rash on Legs   Rash attributed to medication side effects, unresponsive to topical treatments. Consider biopsy if rash persists. sees dermatologist   Mobility Issues m Mobility issues due to fatigue and decreased walking ability. Consider purchasing a transporter wheelchair for long distances.        Return if symptoms worsen or fail to improve.  Ellsworth Haas, MD

## 2023-11-08 NOTE — Patient Instructions (Addendum)
 Yes, scleroderma can cause hair loss.  Scleroderma leads to skin fibrosis, which obliterates hair follicles.[1-3]  Loss of hair follicles is associated with dermal fibrosis and collagen accumulation.[1-3]  Both systemic and localized forms of scleroderma can result in hair loss.[2][4]  Hair loss is often accompanied by other skin changes such as atrophy, induration, and loss of sweat glands.[1-3]  The severity of hair loss can vary depending on the extent and progression of the disease.   Call the rheumatologist  Try rogaine  [place]

## 2023-11-15 ENCOUNTER — Ambulatory Visit: Admitting: *Deleted

## 2023-11-15 DIAGNOSIS — M810 Age-related osteoporosis without current pathological fracture: Secondary | ICD-10-CM | POA: Diagnosis not present

## 2023-11-15 MED ORDER — ROMOSOZUMAB-AQQG 105 MG/1.17ML ~~LOC~~ SOSY
210.0000 mg | PREFILLED_SYRINGE | SUBCUTANEOUS | Status: AC
  Administered 2023-12-18: 210 mg via SUBCUTANEOUS

## 2023-11-15 NOTE — Progress Notes (Signed)
 Per orders of Dr. Waldo Guitar, injection of Evenity  given in left and right upper arms by Albertine Alpha, CMA. Patient tolerated injection well. Patient reminded to schedule next injection.

## 2023-11-22 ENCOUNTER — Ambulatory Visit: Payer: Medicare Other

## 2023-11-22 VITALS — Ht 63.0 in | Wt 128.0 lb

## 2023-11-22 DIAGNOSIS — Z0000 Encounter for general adult medical examination without abnormal findings: Secondary | ICD-10-CM

## 2023-11-22 NOTE — Patient Instructions (Signed)
 Ms. Pruett , Thank you for taking time out of your busy schedule to complete your Annual Wellness Visit with me. I enjoyed our conversation and look forward to speaking with you again next year. I, as well as your care team,  appreciate your ongoing commitment to your health goals. Please review the following plan we discussed and let me know if I can assist you in the future. Your Game plan/ To Do List    Referrals: If you haven't heard from the office you've been referred to, please reach out to them at the phone provided.   Follow up Visits: Next Medicare AWV with our clinical staff: 12/01/24   Have you seen your provider in the last 6 months (3 months if uncontrolled diabetes)? Yes Next Office Visit with your provider: 02/12/24  Clinician Recommendations:  Each day, aim for 6 glasses of water, plenty of protein in your diet and try to get up and walk/ stretch every hour for 5-10 minutes at a time.        This is a list of the screening recommended for you and due dates:  Health Maintenance  Topic Date Due   Medicare Annual Wellness Visit  11/16/2023   Flu Shot  12/28/2023   DTaP/Tdap/Td vaccine (3 - Td or Tdap) 10/13/2028   Pneumococcal Vaccine for age over 53  Completed   DEXA scan (bone density measurement)  Completed   Zoster (Shingles) Vaccine  Completed   Hepatitis B Vaccine  Aged Out   HPV Vaccine  Aged Out   Meningitis B Vaccine  Aged Out   COVID-19 Vaccine  Discontinued    Advanced directives: (Copy Requested) Please bring a copy of your health care power of attorney and living will to the office to be added to your chart at your convenience. You can mail to [facility] [address] [place], [place] [address] or email to [email or web address] Advance Care Planning is important because it:  [x]  Makes sure you receive the medical care that is consistent with your values, goals, and preferences  [x]  It provides guidance to your family and loved ones  and reduces their decisional burden about whether or not they are making the right decisions based on your wishes.  Follow the link provided in your after visit summary or read over the paperwork we have mailed to you to help you started getting your Advance Directives in place. If you need assistance in completing these, please reach out to us  so that we can help you!  See attachments for Preventive Care and Fall Prevention Tips.

## 2023-11-22 NOTE — Progress Notes (Addendum)
 Subjective:   [name] is a 89 y.o. who presents for a Medicare Wellness preventive visit.  As a reminder, Annual Wellness Visits don't include a physical exam, and some assessments may be limited, especially if this visit is performed virtually. We may recommend an in-person follow-up visit with your provider if needed.  Visit Complete: Virtual I connected with  [name] on 11/22/23 by a audio enabled telemedicine application and verified that I am speaking with the correct person using two identifiers.  Patient Location: Home  Provider Location: Office/Clinic  I discussed the limitations of evaluation and management by telemedicine. The patient expressed understanding and agreed to proceed.  Vital Signs: Because this visit was a virtual/telehealth visit, some criteria may be missing or patient reported. Any vitals not documented were not able to be obtained and vitals that have been documented are patient reported.  VideoDeclined- This patient declined Librarian, academic. Therefore the visit was completed with audio only.  Persons Participating in Visit: Patient.  AWV Questionnaire: No: Patient Medicare AWV questionnaire was not completed prior to this visit.  Cardiac Risk Factors include: advanced age (>11men, >74 women);dyslipidemia;hypertension     Objective:    Today's Vitals   11/22/23 1306  Weight: 128 lb (58.1 kg)  Height: 5' 3 (1.6 m)   Body mass index is 22.67 kg/m.     11/22/2023    1:12 PM 11/06/2023    3:56 AM 09/22/2023    9:01 AM 09/13/2023   10:59 AM 08/09/2023   11:54 AM 06/07/2023   12:47 PM 04/05/2023   11:55 AM  Advanced Directives  Does Patient Have a Medical Advance Directive? Yes No No Yes Yes Yes Yes  Type of Estate agent of [place];Living will   Living will Living will;Healthcare Power of [employer] Power of [place];Living will Healthcare Power of [place];Living  will  Does patient want to make changes to medical advance directive?     No - Patient declined    Copy of Healthcare Power of Attorney in Chart? No - copy requested     Yes - validated most recent copy scanned in chart (See row information) Yes - validated most recent copy scanned in chart (See row information)  Would patient like information on creating a medical advance directive?   No - Patient declined        Current Medications (verified) Outpatient Encounter Medications as of 11/22/2023  Medication Sig   albuterol  (PROVENTIL ) (2.5 MG/3ML) 0.083% nebulizer solution Take 2.5 mg by nebulization every 4 (four) hours as needed for shortness of breath or wheezing.   amLODipine  (NORVASC ) 10 MG tablet TAKE 1 TABLET(10 MG) BY MOUTH DAILY   arformoterol  (BROVANA ) 15 MCG/2ML NEBU Take 15 mcg by nebulization in the morning and at bedtime.   atorvastatin  (LIPITOR) 40 MG tablet TAKE 1 TABLET BY MOUTH EVERY NIGHT AT BEDTIME   budesonide  (PULMICORT ) 0.25 MG/2ML nebulizer solution Take 0.25 mg by nebulization in the morning and at bedtime.   CALCIUM  PO Take 600 mg by mouth daily.   carbamazepine  (TEGRETOL  XR) 100 MG 12 hr tablet Take 200 mg by mouth at bedtime.   Chlorpheniramine Maleate (ALLERGY PO) Take 2 tablets by mouth at bedtime. Takes 2 [facility] Pills at night   Cholecalciferol 25 MCG (1000 UT) capsule Take 1,000 Units by mouth daily.   Docusate Sodium  (COLACE PO) Take 1 tablet by mouth daily.   gabapentin  (NEURONTIN ) 300 MG capsule Take 300 mg by  mouth at bedtime.   levothyroxine  (SYNTHROID ) 50 MCG tablet TAKE 1 TABLET(50 MCG) BY MOUTH DAILY BEFORE BREAKFAST   mometasone  (NASONEX ) 50 MCG/ACT nasal spray Place 2 sprays into the nose in the morning and at bedtime.   montelukast  (SINGULAIR ) 10 MG tablet TAKE 1 TABLET(10 MG) BY MOUTH AT BEDTIME   NONFORMULARY OR COMPOUNDED ITEM Take 1 Inhalation by mouth daily. Mometasone /levofloxacin compounded inhalation   ondansetron  (ZOFRAN ) 4 MG tablet  Take 4 mg by mouth every 8 (eight) hours as needed for nausea.   PHENobarbital  (LUMINAL) 32.4 MG tablet TAKE 1 TABLET BY MOUTH AT 6 AM AND 2 TABLETS BY MOUTH AT 8 PM.   predniSONE  (DELTASONE ) 5 MG tablet Take 10 mg by mouth daily.   VENTOLIN  HFA 108 (90 Base) MCG/ACT inhaler Inhale 1-2 puffs into the lungs every 4 (four) hours as needed for wheezing or shortness of breath.   Facility-Administered Encounter Medications as of 11/22/2023  Medication   Romosozumab -aqqg (EVENITY ) 105 MG/1. injection 210 mg   [START ON 12/15/2023] Romosozumab -aqqg (EVENITY ) 105 MG/1. 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   chronic sinusitis 08/08/2012   Colon polyp    Constipation    COPD (chronic obstructive pulmonary disease) (HCC)    Cystocele 09/16/2012   Fecal incontinence 10/03/2011   Follows with Dr Albertus  Rectocele noted     GERD (gastroesophageal reflux disease)    History of kidney stones    Hyperlipidemia    Hypertension    Hypothyroid    Neurogenic bladder 2013   husband caths pt, manages with timed void   Osteoporosis    Pulmonary embolism (HCC)    1980s   Raynaud phenomenon    Renal cyst    Non-complex, contrast MRI 2013 with L>R non-complex cysts, dominant 3.7 left lower pole   Scleroderma (HCC)    Seizures (HCC)    1990 last seizures on meds Phenobarb   Shingles late 90's   Skin cancer    squamous cell   Thyroid  nodule    Right thyroid   lobe, only seen on Sagittal imaging measures 2.3 cm in craniocaudal dimension and appears stable   Tubular adenoma    Vitamin D  deficiency    Past Surgical History:  Procedure Laterality Date   ABDOMINAL HYSTERECTOMY     ANTERIOR FUSION CLIVUS-C2 EXTRAORAL W/ ODONTOID EXCISION  12/2012   APPENDECTOMY     ARTHROSCOPIC REPAIR ACL Left 1976   BASAL CELL CARCINOMA EXCISION     face   BLADDER SUSPENSION     BREAST DUCTAL SYSTEM EXCISION Right 01/15/2014   Procedure: EXCISION DUCTAL SYSTEM RIGHT BREAST;  Surgeon: Jina Nephew, MD;  Location:  SURGERY CENTER;  Service: General;  Laterality: Right;   BREAST EXCISIONAL BIOPSY Right    CARPAL TUNNEL RELEASE Bilateral    B, mult times   CATARACT EXTRACTION     bilateral   cervical neck ablation     x 7,  C3-C6/3 screws and plate   CESAREAN SECTION     CYSTOCELE REPAIR     CYSTOSCOPY/URETEROSCOPY/HOLMIUM LASER/STENT PLACEMENT Right 09/19/2023   Procedure: CYSTOSCOPY/URETEROSCOPY/BASKETING OF STONES;  Surgeon: Alvaro Ricardo KATHEE Mickey., MD;  Location: [facility];  Service: Urology;  Laterality: Right;   DILATION AND CURETTAGE OF UTERUS     duptyren's contracture right hand     FINGER ARTHRODESIS Right 05/06/2015   Procedure: RIGHT RING PROXIMAL INTERPHALANGEAL FUSION (PIP);  Surgeon: Franky Curia, MD;  Location: [facility];  Service: Orthopedics;  Laterality: Right;   FINGER GANGLION CYST EXCISION     right   HEMICOLECTOMY Right    JOINT REPLACEMENT     right and left basal joints of thumbs   left finger fusion     3 fingers on left hand/one finger right   left ovary and tube removed     NASAL POLYP SURGERY     4 sinus surgeries   NASAL SEPTUM SURGERY     PANNICULECTOMY     PROXIMAL INTERPHALANGEAL FUSION (PIP) Left 09/09/2013   Procedure: FUSION LEFT INDEX PROXIMAL INTERPHALANGEAL JOINT (PIP);  Surgeon: Lamar LULLA Leonor Mickey., MD;  Location: [facility];  Service: Orthopedics;  Laterality: Left;   right median  nerve decompression     SHOULDER ARTHROSCOPY     x2 left, 1 right   sinus surgeries     x 4   squamous lesions removed     neck and face   STERIOD INJECTION Right 05/06/2015   Procedure: STEROID INJECTION;  Surgeon: Franky Curia, MD;  Location: [facility];  Service: Orthopedics;  Laterality: Right;  Right Index Finger Proximal InterPhalangeal Joint Injection   TONSILLECTOMY AND ADENOIDECTOMY     TUBAL LIGATION     vocal polyps removed     Family History  Problem Relation Age of Onset   Heart disease Mother    Hyperlipidemia Mother        M, aunt   Colon cancer Father        35   Ovarian cancer Sister 25 - 41   Lung cancer Sister        lung - stage 1   Thyroid  cancer Sister    Skin cancer Daughter    Malignant hyperthermia Cousin    Stomach cancer Other        first cousin   Breast cancer Neg Hx    Esophageal cancer Neg Hx    Pancreatic cancer Neg Hx    Social History   Socioeconomic History   Marital status: Married    Spouse name: Not on file   Number of children: 3   Years of education: Not on file   Highest education level: Not on file  Occupational History   Occupation: retired, Geologist, engineering    Employer: RETIRED  Tobacco Use   Smoking status: Former    Current packs/day: 0.00    Average packs/day: 0.5 packs/day for 3.0 years (1.5 ttl pk-yrs)    Types: Cigarettes    Start date: 05/30/1975    Quit date: 05/29/1978    Years since quitting: 45.5   Smokeless tobacco: Never   Tobacco comments:    quit smoking 35 years ago  Vaping Use   Vaping status: Never Used  Substance and Sexual Activity   Alcohol  use: No   Drug use: No   Sexual activity: Not Currently  Other Topics Concern   Not on file  Social History Narrative  Pt lives at home with her husband. She is originally from [place]. Moved to [place] in 2005.     3 grown children - Son is a Education officer, community in the area, son in [place] , daughter in [place]   Pt is a retired Engineer, civil (consulting)   Social Drivers of  Corporate investment banker Strain: Low Risk  (11/22/2023)   Overall Financial Resource Strain (CARDIA)    Difficulty of Paying Living Expenses: Not hard at all  Food Insecurity: No Food Insecurity (11/22/2023)   Hunger Vital Sign    Worried About Running Out of Food in the Last Year: Never true    Ran Out of Food in the Last Year: Never true  Transportation Needs: No Transportation Needs (11/22/2023)   PRAPARE - Administrator, Civil Service (Medical): No    Lack of Transportation (Non-Medical): No  Physical Activity: Inactive (11/22/2023)   Exercise Vital Sign    Days of Exercise per Week: 0 days    Minutes of Exercise per Session: 0 min  Stress: No Stress Concern Present (11/22/2023)   [employer] of Occupational Health - Occupational Stress Questionnaire    Feeling of Stress: Not at all  Social Connections: Socially Integrated (11/22/2023)   Social Connection and Isolation Panel    Frequency of Communication with Friends and Family: More than three times a week    Frequency of Social Gatherings with Friends and Family: More than three times a week    Attends Religious Services: 1 to 4 times per year    Active Member of [employer] or Organizations: Yes    Attends Banker Meetings: 1 to 4 times per year    Marital Status: Married    Tobacco Counseling Counseling given: Not Answered Tobacco comments: quit smoking 35 years ago    Clinical Intake:  Pre-visit preparation completed: Yes  Pain : No/denies pain     BMI - recorded: 22.67 Nutritional Status: BMI of 19-24  Normal Nutritional Risks: None Diabetes: No  No results found for: HGBA1C   How often do you need to have someone help you when you read instructions, pamphlets, or other written materials from your doctor or pharmacy?: 1 - Never  Interpreter Needed?: No  Information entered by :: Julia Haws, LPN   Activities of Daily Living     11/22/2023    1:08 PM 09/22/2023    3:34 PM   In your present state of health, do you have any difficulty performing the following activities:  Hearing? 1 0  Comment hearing aids   Vision? 0 0  Difficulty concentrating or making decisions? 0 0  Walking or climbing stairs? 0   Comment no stairs   Dressing or bathing? 1   Comment husband assist   Doing errands, shopping? 0 0  Preparing Food and eating ? Y   Using the Toilet? N   In the past six months, have you accidently leaked urine? Y   Comment diapers at night and pads   Do you have problems with loss of bowel control? N   Managing your Medications? Y   Comment husband   Managing your Finances? N   Housekeeping or managing your Housekeeping? Y   Comment husband     Patient Care Team: Wendolyn Jenkins Jansky, MD as PCP - General (Family Medicine) Pyrtle, Gordy HERO, MD as Consulting Physician (Gastroenterology) Lawrnce Fling, MD as Consulting Physician (Neurosurgery) Alvaro Ricardo KATHEE Raddle., MD as Consulting Physician (Urology) Birdena Lin, MD as  Consulting Physician (Pulmonary Disease) Trixie File, MD as Consulting Physician (Endocrinology) Murrell Drivers, MD as Consulting Physician (Orthopedic Surgery) Hobson-Webb, Olam Plana, MD as Referring Physician (Neurology) Eustace Arlean Kay, MD as Referring Physician (Dermatology) Maree Llanos, MD as Referring Physician (Rheumatology)  I have updated your Care Teams any recent Medical Services you may have received from other providers in the past year.     Assessment:   This is a routine wellness examination for [name].  Hearing/Vision screen Hearing Screening - Comments:: Pt has hearing aids  Vision Screening - Comments:: Wears rx glasses - up to date with routine eye exams with [facility]    Goals Addressed             This Visit's Progress    Patient Stated       Maintain health and activity as best as possible        Depression Screen     11/22/2023    1:17 PM 10/16/2023    2:18 PM 07/05/2023    9:35  AM 01/22/2023   11:09 AM 11/16/2022    1:38 PM 06/29/2022    9:44 AM 09/22/2021   10:48 AM  PHQ 2/9 Scores  PHQ - 2 Score 0 0 2 2 0 0 0  PHQ- 9 Score 0 6 4 2        Fall Risk     11/22/2023    1:13 PM 01/22/2023   11:08 AM 11/16/2022    1:41 PM 06/29/2022    9:44 AM 12/27/2021    9:05 AM  Fall Risk   Falls in the past year? 1 1 0 1 1  Number falls in past yr: 1 1 0 0 0  Injury with Fall? 1 0 0 0 1  Comment bruises      Risk for fall due to : History of fall(s);Impaired balance/gait;Impaired mobility Impaired balance/gait Impaired mobility;Impaired balance/gait  Impaired mobility;Impaired balance/gait  Follow up Falls prevention discussed Falls prevention discussed Falls prevention discussed Falls evaluation completed Education provided;Falls prevention discussed      Data saved with a previous flowsheet row definition    MEDICARE RISK AT HOME:  Medicare Risk at Home Any stairs in or around the home?: No If so, are there any without handrails?: No Home free of loose throw rugs in walkways, pet beds, electrical cords, etc?: Yes Adequate lighting in your home to reduce risk of falls?: Yes Life alert?: No Use of a cane, walker or w/c?: Yes Grab bars in the bathroom?: Yes Shower chair or bench in shower?: Yes Elevated toilet seat or a handicapped toilet?: Yes  TIMED UP AND GO:  Was the test performed?  No  Cognitive Function: 6CIT completed        11/22/2023    1:26 PM 11/16/2022    1:42 PM 09/22/2021   10:52 AM  6CIT Screen  What Year? 0 points 0 points 0 points  What month? 0 points 0 points 0 points  What time? 0 points 0 points 0 points  Count back from 20 0 points 0 points 0 points  Months in reverse 0 points 0 points 0 points  Repeat phrase 0 points 0 points 0 points  Total Score 0 points 0 points 0 points    Immunizations Immunization History  Administered Date(s) Administered   Fluad Quad(high Dose 65+) 03/13/2019, 04/13/2022   Fluad Trivalent(High Dose 65+)  03/01/2023   Influenza Inj Mdck Quad With Preservative 03/10/2022   Influenza Split 12/27/2012  Influenza Whole 01/28/2011   Influenza,inj,Quad PF,6+ Mos 03/16/2014, 02/17/2015   Influenza-Unspecified 02/12/2012, 02/05/2015, 03/24/2018, 03/02/2020   PFIZER(Purple Top)SARS-COV-2 Vaccination 06/17/2019, 07/08/2019, 03/02/2020, 09/07/2020   Pneumococcal Conjugate-13 12/11/2013   Pneumococcal Polysaccharide-23 05/29/2006   Tdap 04/11/2011, 10/14/2018   Zoster Recombinant(Shingrix ) 05/01/2019, 07/30/2019   Zoster, Live 04/14/2014    Screening Tests Health Maintenance  Topic Date Due   INFLUENZA VACCINE  12/28/2023   Medicare Annual Wellness (AWV)  11/21/2024   DTaP/Tdap/Td (3 - Td or Tdap) 10/13/2028   Pneumococcal Vaccine: 50+ Years  Completed   DEXA SCAN  Completed   Zoster Vaccines- Shingrix   Completed   Hepatitis B Vaccines  Aged Out   HPV VACCINES  Aged Out   Meningococcal B Vaccine  Aged Out   COVID-19 Vaccine  Discontinued    Health Maintenance  There are no preventive care reminders to display for this patient.  Health Maintenance Items Addressed: See Nurse Notes at the end of this note  Additional Screening:  Vision Screening: Recommended annual ophthalmology exams for early detection of glaucoma and other disorders of the eye. Would you like a referral to an eye doctor? No    Dental Screening: Recommended annual dental exams for proper oral hygiene  Community Resource Referral / Chronic Care Management: CRR required this visit?  No   CCM required this visit?  No   Plan:    I have personally reviewed and noted the following in the patient's chart:   Medical and social history Use of alcohol , tobacco or illicit drugs  Current medications and supplements including opioid prescriptions. Patient is not currently taking opioid prescriptions. Functional ability and status Nutritional status Physical activity Advanced directives List of other  physicians Hospitalizations, surgeries, and ER visits in previous 12 months Vitals Screenings to include cognitive, depression, and falls Referrals and appointments  In addition, I have reviewed and discussed with patient certain preventive protocols, quality metrics, and best practice recommendations. A written personalized care plan for preventive services as well as general preventive health recommendations were provided to patient.   Julia VEAR Haws, LPN   [id number]   After Visit Summary: (MyChart) Due to this being a telephonic visit, the after visit summary with patients personalized plan was offered to patient via MyChart   Notes: Nothing significant to report at this time.

## 2023-12-16 ENCOUNTER — Other Ambulatory Visit: Payer: Self-pay | Admitting: Family Medicine

## 2023-12-16 DIAGNOSIS — R569 Unspecified convulsions: Secondary | ICD-10-CM

## 2023-12-18 ENCOUNTER — Ambulatory Visit

## 2023-12-18 DIAGNOSIS — M810 Age-related osteoporosis without current pathological fracture: Secondary | ICD-10-CM

## 2023-12-18 MED ORDER — ROMOSOZUMAB-AQQG 105 MG/1.17ML ~~LOC~~ SOSY
210.0000 mg | PREFILLED_SYRINGE | SUBCUTANEOUS | Status: AC
  Administered 2024-01-17: 210 mg via SUBCUTANEOUS

## 2023-12-18 NOTE — Progress Notes (Signed)
 Per orders of Dr. Wendolyn, injection of Evenity  given in left and right upper arm per patient preference by Joen GORMAN Molt, CMA.  Patient tolerated injection well. Patient reminded to schedule next injection.

## 2023-12-19 DIAGNOSIS — L648 Other androgenic alopecia: Secondary | ICD-10-CM | POA: Diagnosis not present

## 2023-12-19 DIAGNOSIS — D692 Other nonthrombocytopenic purpura: Secondary | ICD-10-CM | POA: Diagnosis not present

## 2023-12-23 ENCOUNTER — Other Ambulatory Visit: Payer: Self-pay | Admitting: Family Medicine

## 2024-01-08 DIAGNOSIS — J449 Chronic obstructive pulmonary disease, unspecified: Secondary | ICD-10-CM | POA: Diagnosis not present

## 2024-01-17 ENCOUNTER — Other Ambulatory Visit: Payer: Self-pay

## 2024-01-17 ENCOUNTER — Inpatient Hospital Stay (HOSPITAL_BASED_OUTPATIENT_CLINIC_OR_DEPARTMENT_OTHER): Admitting: Hematology & Oncology

## 2024-01-17 ENCOUNTER — Telehealth: Payer: Self-pay

## 2024-01-17 ENCOUNTER — Ambulatory Visit

## 2024-01-17 ENCOUNTER — Encounter: Payer: Self-pay | Admitting: Hematology & Oncology

## 2024-01-17 ENCOUNTER — Inpatient Hospital Stay: Attending: Hematology & Oncology

## 2024-01-17 VITALS — BP 126/47 | HR 92 | Temp 98.5°F | Resp 18 | Ht 62.0 in | Wt 134.0 lb

## 2024-01-17 DIAGNOSIS — Z7951 Long term (current) use of inhaled steroids: Secondary | ICD-10-CM | POA: Diagnosis not present

## 2024-01-17 DIAGNOSIS — Z79899 Other long term (current) drug therapy: Secondary | ICD-10-CM | POA: Insufficient documentation

## 2024-01-17 DIAGNOSIS — E039 Hypothyroidism, unspecified: Secondary | ICD-10-CM | POA: Diagnosis not present

## 2024-01-17 DIAGNOSIS — Z91041 Radiographic dye allergy status: Secondary | ICD-10-CM | POA: Diagnosis not present

## 2024-01-17 DIAGNOSIS — Z79890 Hormone replacement therapy (postmenopausal): Secondary | ICD-10-CM | POA: Insufficient documentation

## 2024-01-17 DIAGNOSIS — D61818 Other pancytopenia: Secondary | ICD-10-CM

## 2024-01-17 DIAGNOSIS — M810 Age-related osteoporosis without current pathological fracture: Secondary | ICD-10-CM

## 2024-01-17 DIAGNOSIS — R748 Abnormal levels of other serum enzymes: Secondary | ICD-10-CM | POA: Diagnosis not present

## 2024-01-17 DIAGNOSIS — D649 Anemia, unspecified: Secondary | ICD-10-CM

## 2024-01-17 DIAGNOSIS — N319 Neuromuscular dysfunction of bladder, unspecified: Secondary | ICD-10-CM

## 2024-01-17 DIAGNOSIS — E559 Vitamin D deficiency, unspecified: Secondary | ICD-10-CM

## 2024-01-17 DIAGNOSIS — D751 Secondary polycythemia: Secondary | ICD-10-CM | POA: Insufficient documentation

## 2024-01-17 DIAGNOSIS — Z885 Allergy status to narcotic agent status: Secondary | ICD-10-CM | POA: Diagnosis not present

## 2024-01-17 DIAGNOSIS — Z7952 Long term (current) use of systemic steroids: Secondary | ICD-10-CM | POA: Diagnosis not present

## 2024-01-17 DIAGNOSIS — D126 Benign neoplasm of colon, unspecified: Secondary | ICD-10-CM

## 2024-01-17 DIAGNOSIS — C4490 Unspecified malignant neoplasm of skin, unspecified: Secondary | ICD-10-CM

## 2024-01-17 DIAGNOSIS — D582 Other hemoglobinopathies: Secondary | ICD-10-CM | POA: Insufficient documentation

## 2024-01-17 DIAGNOSIS — R5383 Other fatigue: Secondary | ICD-10-CM | POA: Insufficient documentation

## 2024-01-17 DIAGNOSIS — Z888 Allergy status to other drugs, medicaments and biological substances status: Secondary | ICD-10-CM | POA: Insufficient documentation

## 2024-01-17 LAB — TSH: TSH: 0.939 u[IU]/mL (ref 0.350–4.500)

## 2024-01-17 LAB — CMP (CANCER CENTER ONLY)
ALT: 29 U/L (ref 0–44)
AST: 38 U/L (ref 15–41)
Albumin: 4.3 g/dL (ref 3.5–5.0)
Alkaline Phosphatase: 99 U/L (ref 38–126)
Anion gap: 11 (ref 5–15)
BUN: 26 mg/dL — ABNORMAL HIGH (ref 8–23)
CO2: 26 mmol/L (ref 22–32)
Calcium: 9.1 mg/dL (ref 8.9–10.3)
Chloride: 108 mmol/L (ref 98–111)
Creatinine: 0.87 mg/dL (ref 0.44–1.00)
GFR, Estimated: >60 mL/min (ref >60)
Glucose, Bld: 118 mg/dL — ABNORMAL HIGH (ref 70–99)
Potassium: 4.4 mmol/L (ref 3.5–5.1)
Sodium: 145 mmol/L (ref 135–145)
Total Bilirubin: 0.2 mg/dL (ref 0.0–1.2)
Total Protein: 7.1 g/dL (ref 6.5–8.1)

## 2024-01-17 LAB — RETICULOCYTES
Immature Retic Fract: 5.2 % (ref 2.3–15.9)
RBC.: 4.36 MIL/uL (ref 3.87–5.11)
Retic Count, Absolute: 45.3 K/uL (ref 19.0–186.0)
Retic Ct Pct: 1 % (ref 0.4–3.1)

## 2024-01-17 LAB — CBC WITH DIFFERENTIAL (CANCER CENTER ONLY)
Abs Immature Granulocytes: 0.01 K/uL (ref 0.00–0.07)
Basophils Absolute: 0 K/uL (ref 0.0–0.1)
Basophils Relative: 1 %
Eosinophils Absolute: 0 K/uL (ref 0.0–0.5)
Eosinophils Relative: 0 %
HCT: 40.6 % (ref 36.0–46.0)
Hemoglobin: 13.4 g/dL (ref 12.0–15.0)
Immature Granulocytes: 0 %
Lymphocytes Relative: 35 %
Lymphs Abs: 1.6 K/uL (ref 0.7–4.0)
MCH: 30.7 pg (ref 26.0–34.0)
MCHC: 33 g/dL (ref 30.0–36.0)
MCV: 93.1 fL (ref 80.0–100.0)
Monocytes Absolute: 0.4 K/uL (ref 0.1–1.0)
Monocytes Relative: 8 %
Neutro Abs: 2.5 K/uL (ref 1.7–7.7)
Neutrophils Relative %: 56 %
Platelet Count: 186 K/uL (ref 150–400)
RBC: 4.36 MIL/uL (ref 3.87–5.11)
RDW: 13.5 % (ref 11.5–15.5)
WBC Count: 4.5 K/uL (ref 4.0–10.5)
nRBC: 0 % (ref 0.0–0.2)

## 2024-01-17 LAB — LACTATE DEHYDROGENASE: LDH: 203 U/L — ABNORMAL HIGH (ref 98–192)

## 2024-01-17 LAB — FERRITIN: Ferritin: 64 ng/mL (ref 11–307)

## 2024-01-17 LAB — IRON AND IRON BINDING CAPACITY (CC-WL,HP ONLY)
Iron: 82 ug/dL (ref 28–170)
Saturation Ratios: 27 % (ref 10.4–31.8)
TIBC: 302 ug/dL (ref 250–450)
UIBC: 220 ug/dL

## 2024-01-17 MED ORDER — ROMOSOZUMAB-AQQG 105 MG/1.17ML ~~LOC~~ SOSY
210.0000 mg | PREFILLED_SYRINGE | Freq: Once | SUBCUTANEOUS | Status: AC
Start: 2024-01-31 — End: 2024-02-12
  Administered 2024-02-12: 210 mg via SUBCUTANEOUS

## 2024-01-17 NOTE — Progress Notes (Signed)
 Hematology and Oncology Follow Up Visit  [name] [id number] Oct 25, 1932 89 y.o. 01/17/2024   Principle Diagnosis:  Transient erythrocytosis - JAK2 (-)  Current Therapy:   Observation     Interim History:  [name] is back for follow-up.  Overall, I think she is doing okay.  She does feel tired.  When she was last here in May, her iron studies looked okay.  Her ferritin was 113 with an iron saturation of 30%.    She had a TSH that was checked back in May that was 0.82.  She has had no problems with fever.  Her appetite is good.  She has had no nausea or vomiting.  She has had no cough.  She has had no change in bowel or bladder habits.  Overall, I would have to say that her performance status is probably ECOG 2.   Medications:  Current Outpatient Medications:    Romosozumab -aqqg (EVENITY ) 105 MG/1. SOSY injection, Inject 210 mg into the skin every 30 (thirty) days., Disp: , Rfl:    albuterol  (PROVENTIL ) (2.5 MG/3ML) 0.083% nebulizer solution, Take 2.5 mg by nebulization every 4 (four) hours as needed for shortness of breath or wheezing., Disp: , Rfl:    amLODipine  (NORVASC ) 10 MG tablet, TAKE 1 TABLET(10 MG) BY MOUTH DAILY, Disp: 90 tablet, Rfl: 3   arformoterol  (BROVANA ) 15 MCG/2ML NEBU, Take 15 mcg by nebulization in the morning and at bedtime., Disp: , Rfl:    atorvastatin  (LIPITOR) 40 MG tablet, TAKE 1 TABLET BY MOUTH EVERY NIGHT AT BEDTIME, Disp: 90 tablet, Rfl: 3   budesonide  (PULMICORT ) 0.25 MG/2ML nebulizer solution, Take 0.25 mg by nebulization in the morning and at bedtime., Disp: , Rfl:    CALCIUM  PO, Take 600 mg by mouth daily., Disp: , Rfl:    carbamazepine  (TEGRETOL  XR) 100 MG 12 hr tablet, Take 200 mg by mouth at bedtime., Disp: , Rfl:    Chlorpheniramine Maleate (ALLERGY PO), Take 2 tablets by mouth at bedtime. Takes 2 [place] Allergy Pills at night, Disp: , Rfl:    Cholecalciferol 25 MCG (1000 UT) capsule, Take 1,000 Units by mouth daily., Disp: ,  Rfl:    Docusate Sodium  (COLACE PO), Take 1 tablet by mouth daily., Disp: , Rfl:    gabapentin  (NEURONTIN ) 300 MG capsule, Take 300 mg by mouth at bedtime., Disp: , Rfl:    levothyroxine  (SYNTHROID ) 50 MCG tablet, TAKE 1 TABLET(50 MCG) BY MOUTH DAILY BEFORE BREAKFAST, Disp: 90 tablet, Rfl: 3   mometasone  (NASONEX ) 50 MCG/ACT nasal spray, Place 2 sprays into the nose in the morning and at bedtime., Disp: , Rfl:    montelukast  (SINGULAIR ) 10 MG tablet, TAKE 1 TABLET(10 MG) BY MOUTH AT BEDTIME, Disp: 90 tablet, Rfl: 3   NONFORMULARY OR COMPOUNDED ITEM, Take 1 Inhalation by mouth daily. Mometasone /levofloxacin compounded inhalation, Disp: , Rfl:    ondansetron  (ZOFRAN ) 4 MG tablet, Take 4 mg by mouth every 8 (eight) hours as needed for nausea., Disp: , Rfl:    PHENobarbital  (LUMINAL) 32.4 MG tablet, TAKE 1 TABLET BY MOUTH DAILY AT 6:00AM AND 2 TABLETS BY MOUTH AT 8:00PM, Disp: 270 tablet, Rfl: 0   predniSONE  (DELTASONE ) 5 MG tablet, Take 10 mg by mouth daily., Disp: , Rfl:    VENTOLIN  HFA 108 (90 Base) MCG/ACT inhaler, Inhale 1-2 puffs into the lungs every 4 (four) hours as needed for wheezing or shortness of breath., Disp: , Rfl:   Current Facility-Administered Medications:    Romosozumab -aqqg (EVENITY ) 105  MG/1. injection 210 mg, 210 mg, Subcutaneous, Once, Wendolyn Jenkins Jansky, MD   NOREEN ON 01/31/2024] Romosozumab -aqqg (EVENITY ) 105 MG/1. injection 210 mg, 210 mg, Subcutaneous, Once, Wendolyn Jenkins Jansky, MD  Allergies:  Allergies  Allergen Reactions   Iodinated Contrast Media Anaphylaxis    Cardiac arrest   Iodine Other (See Comments)    Cardiac arrest As young adult, received iodinated contrast for IVP, went into cardiac arrest, was resuscitated and hospitalized. Has not received iodinated contrast since that episode. Reports no adverse reaction to betadine.    Metrizamide Other (See Comments)    Cardiac arrest     Morphine Other (See Comments)    Cardiac arrest Patient has previously  tolerated hydrocodone, hydromorphone  and fentanyl     Morphine And Codeine Other (See Comments)    Cardiac arrest   Cocklebur    Lambs Quarters    Mugwort    Sheep Sorrel    Spiny Pigweed (Amaranthus Spinosus) Skin Test    Tramadol  Other (See Comments)    Causing severe constipation     Past Medical History, Surgical history, Social history, and Family History were reviewed and updated.  Review of Systems: Review of Systems  Constitutional: Negative.  Negative for appetite change, fatigue, fever and unexpected weight change.  HENT:  Negative.  Negative for lump/mass, mouth sores, sore throat and trouble swallowing.   Eyes: Negative.   Respiratory: Negative.  Negative for cough, hemoptysis and shortness of breath.   Cardiovascular: Negative.  Negative for leg swelling and palpitations.  Gastrointestinal:  Positive for diarrhea. Negative for abdominal distention, abdominal pain, blood in stool, constipation, nausea and vomiting.  Endocrine: Negative.   Genitourinary: Negative.  Negative for bladder incontinence, dysuria, frequency and hematuria.   Musculoskeletal:  Positive for gait problem. Negative for arthralgias, back pain and myalgias.  Skin: Negative.  Negative for itching and rash.  Neurological:  Positive for extremity weakness and gait problem. Negative for dizziness, headaches, numbness, seizures and speech difficulty.  Hematological: Negative.  Does not bruise/bleed easily.  Psychiatric/Behavioral: Negative.  Negative for depression and sleep disturbance. The patient is not nervous/anxious.     Physical Exam:  height is 5' 2 (1.575 m) and weight is 134 lb (60.8 kg). Her oral temperature is 98.5 F (36.9 C). Her blood pressure is 126/47 (abnormal) and her pulse is 92. Her respiration is 18 and oxygen saturation is 97%.   Wt Readings from Last 3 Encounters:  01/17/24 134 lb (60.8 kg)  11/22/23 128 lb (58.1 kg)  11/08/23 128 lb (58.1 kg)    Physical Exam Vitals  reviewed.  HENT:     Head: Normocephalic and atraumatic.  Eyes:     Pupils: Pupils are equal, round, and reactive to light.  Cardiovascular:     Rate and Rhythm: Normal rate and regular rhythm.     Heart sounds: Normal heart sounds.  Pulmonary:     Effort: Pulmonary effort is normal.     Breath sounds: Normal breath sounds.  Abdominal:     General: Bowel sounds are normal.     Palpations: Abdomen is soft.  Musculoskeletal:        General: No tenderness or deformity. Normal range of motion.     Cervical back: Normal range of motion.  Lymphadenopathy:     Cervical: No cervical adenopathy.  Skin:    General: Skin is warm and dry.     Findings: No erythema or rash.  Neurological:     Mental Status: She is alert and  oriented to person, place, and time.  Psychiatric:        Behavior: Behavior normal.        Thought Content: Thought content normal.        Judgment: Judgment normal.      Lab Results  Component Value Date   WBC 4.5 01/17/2024   HGB 13.4 01/17/2024   HCT 40.6 01/17/2024   MCV 93.1 01/17/2024   PLT 186 01/17/2024     Chemistry      Component Value Date/Time   NA 145 01/17/2024 1437   K 4.4 01/17/2024 1437   CL 108 01/17/2024 1437   CO2 26 01/17/2024 1437   BUN 26 (H) 01/17/2024 1437   CREATININE 0.87 01/17/2024 1437   CREATININE 0.78 06/27/2012 1139      Component Value Date/Time   CALCIUM  9.1 01/17/2024 1437   ALKPHOS 99 01/17/2024 1437   AST 38 01/17/2024 1437   ALT 29 01/17/2024 1437   BILITOT 0.2 01/17/2024 1437      Impression and Plan:  [name] is a very charming 89 year old white female.  She had transient erythrocytosis.  So far, her testing really has not been positive for any thing that looks like a myeloproliferative disorder.  She does not need to be phlebotomized at all.  We are rechecking a TSH on her.  We will plan to get her back around the Holiday season.   Maude JONELLE Crease, MD 8/21/20253:24 PM

## 2024-01-17 NOTE — Telephone Encounter (Signed)
 Evenity  VOB initiated via [email or web address]  Last Evenity  inj: 01/17/24 Next Evenity  inj DUE: 02/17/24

## 2024-01-17 NOTE — Progress Notes (Signed)
 Per orders of Dr. Wendolyn, injection of Evenity  given in left and right upper arm per patient preference by Avelina Situ.  Patient tolerated injection well. Patient reminded to schedule next injection.

## 2024-01-18 ENCOUNTER — Ambulatory Visit: Payer: Self-pay | Admitting: Hematology & Oncology

## 2024-01-18 DIAGNOSIS — J322 Chronic ethmoidal sinusitis: Secondary | ICD-10-CM | POA: Diagnosis not present

## 2024-01-18 DIAGNOSIS — J320 Chronic maxillary sinusitis: Secondary | ICD-10-CM | POA: Diagnosis not present

## 2024-01-18 NOTE — Telephone Encounter (Signed)
 [name]

## 2024-01-18 NOTE — Telephone Encounter (Signed)
 Advised via MyChart.

## 2024-01-18 NOTE — Telephone Encounter (Signed)
-----   Message from Maude JONELLE Crease sent at 01/18/2024 12:48 PM EDT ----- Please call her and let her know that the thyroid  level is fine.  Thanks.  Jeralyn ----- Message ----- From: Rebecka, [facility] [place] Sent: 01/17/2024   7:41 PM EDT To: Maude JONELLE Crease, MD

## 2024-01-18 NOTE — Telephone Encounter (Signed)
 Pt ready for scheduling for EVENITY  on or after : 02/17/24  Option# 1 Buy/Bill (Office supplied medication)  Out-of-pocket cost due at time of  office visit: $0  Number of injection/visits approved: ---  Primary: MEDICARE Evenity  co-insurance: 0% Admin fee co-insurance: 0%  Secondary: BCBS[employer]-MEDSUP Evenity  co-insurance:  Admin fee co-insurance:   Medical Benefit Details: Date Benefits were checked: 01/17/24 Deductible: $257 Met of $257 Required/ Coinsurance: 0%/ Admin Fee: 0%  Prior Auth: N/A PA# Expiration Date:   # of doses approved: ------------------------------------------------------------------------- Option# 2- Med Obtained from pharmacy  Pharmacy benefit: Copay $--- (Paid to pharmacy) Admin Fee: --- (Pay at clinic)  Prior Auth: --- PA# Expiration Date:   # of doses approved:  If patient wants fill through the pharmacy benefit please send prescription to: ---, and include estimated need by date in rx notes. Pharmacy will ship medication directly to the office.  Patient NOT eligible for Evenity  Copay Card. Copay Card can make patient's cost as little as $25. Link to apply: [email or web address]   This summary of benefits is an estimation of the patient's out-of-pocket cost. Exact cost may very based on individual plan coverage.

## 2024-02-08 DIAGNOSIS — R3914 Feeling of incomplete bladder emptying: Secondary | ICD-10-CM | POA: Diagnosis not present

## 2024-02-08 DIAGNOSIS — N3946 Mixed incontinence: Secondary | ICD-10-CM | POA: Diagnosis not present

## 2024-02-08 DIAGNOSIS — N319 Neuromuscular dysfunction of bladder, unspecified: Secondary | ICD-10-CM | POA: Diagnosis not present

## 2024-02-12 ENCOUNTER — Ambulatory Visit (INDEPENDENT_AMBULATORY_CARE_PROVIDER_SITE_OTHER): Admitting: Family Medicine

## 2024-02-12 ENCOUNTER — Encounter: Payer: Self-pay | Admitting: Family Medicine

## 2024-02-12 VITALS — BP 127/73 | HR 83 | Temp 97.6°F | Resp 18 | Ht 62.0 in | Wt 133.0 lb

## 2024-02-12 DIAGNOSIS — I10 Essential (primary) hypertension: Secondary | ICD-10-CM

## 2024-02-12 DIAGNOSIS — E039 Hypothyroidism, unspecified: Secondary | ICD-10-CM | POA: Diagnosis not present

## 2024-02-12 DIAGNOSIS — G6181 Chronic inflammatory demyelinating polyneuritis: Secondary | ICD-10-CM

## 2024-02-12 DIAGNOSIS — M810 Age-related osteoporosis without current pathological fracture: Secondary | ICD-10-CM | POA: Diagnosis not present

## 2024-02-12 DIAGNOSIS — R569 Unspecified convulsions: Secondary | ICD-10-CM | POA: Diagnosis not present

## 2024-02-12 DIAGNOSIS — E7800 Pure hypercholesterolemia, unspecified: Secondary | ICD-10-CM

## 2024-02-12 DIAGNOSIS — Z23 Encounter for immunization: Secondary | ICD-10-CM

## 2024-02-12 DIAGNOSIS — M349 Systemic sclerosis, unspecified: Secondary | ICD-10-CM

## 2024-02-12 MED ORDER — AMLODIPINE BESYLATE 10 MG PO TABS
10.0000 mg | ORAL_TABLET | Freq: Every day | ORAL | 3 refills | Status: DC
Start: 2024-02-12 — End: 2024-04-09

## 2024-02-12 MED ORDER — MONTELUKAST SODIUM 10 MG PO TABS: 10.0000 mg | ORAL_TABLET | Freq: Every day | ORAL | 3 refills | Status: DC

## 2024-02-12 MED ORDER — ATORVASTATIN CALCIUM 40 MG PO TABS: 40.0000 mg | ORAL_TABLET | Freq: Every day | ORAL | 3 refills | Status: AC

## 2024-02-12 MED ORDER — PHENOBARBITAL 32.4 MG PO TABS: ORAL_TABLET | ORAL | 0 refills | Status: DC

## 2024-02-12 MED ORDER — ROMOSOZUMAB-AQQG 105 MG/1.17ML ~~LOC~~ SOSY
210.0000 mg | PREFILLED_SYRINGE | SUBCUTANEOUS | Status: AC
  Administered 2024-03-18: 210 mg via SUBCUTANEOUS

## 2024-02-12 NOTE — Progress Notes (Signed)
 Subjective:     Patient ID: [name], female    DOB: [birth year], 89 y.o.   MRN: [id number]  Chief Complaint  Patient presents with   Medical Management of Chronic Issues    6 month follow-up     HPI Discussed the use of AI scribe software for clinical note transcription with the patient, who gave verbal consent to proceed.  History of Present Illness [name] is an 89 year old female with bladder dysfunction and seizures who presents with urinary retention and fatigue.  She has been experiencing difficulty urinating, leading to urinary retention, necessitating catheterization three times a day. She recalls a similar episode ten years ago, which resolved after a decade of daily catheterization. No signs of infection are present.  She is on phenobarbital  and carbamazepine  (Tegretol ) for seizure management, with carbamazepine  taken at 200 mg at bedtime. These medications have effectively prevented seizures, with no breakthrough events reported.  For COPD, she uses prednisone , albuterol , Pulmicort , Brovana , and montelukast  10 mg daily.  She takes amlodipine  5-10 mg daily for hypertension, levothyroxine  50 mcg daily for thyroid  management, and atorvastatin  40 mg daily for cholesterol control. Her home blood pressure readings are around 130/70 mmHg. No headaches, dizziness, chest pain, or leg swelling are reported.  She experiences significant fatigue, requiring a daily nap around noon for about an hour. No depression or memory issues, but she notes being quieter during family gatherings. Hearing aids are used effectively, with no hearing problems reported.  Her past medical history includes scleroderma, resulting in fused hands, and she is receiving Evenity  for osteoporosis, with plans to switch back to Prolia  after December. She has a history of anemia, with recent labs showing stable iron levels.    There are no preventive care reminders to display for this  patient.   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   chronic sinusitis 08/08/2012   Colon polyp    Constipation    COPD (chronic obstructive pulmonary disease) (HCC)    Cystocele 09/16/2012   Fecal incontinence 10/03/2011   Follows with Dr Albertus  Rectocele noted     GERD (gastroesophageal reflux disease)    History of kidney stones    Hyperlipidemia    Hypertension    Hypothyroid    Neurogenic bladder 2013   husband caths pt, manages with timed void   Osteoporosis    Pulmonary embolism (HCC)    1980s   Raynaud phenomenon    Renal cyst    Non-complex, contrast MRI 2013 with L>R non-complex cysts, dominant 3.7 left lower pole   Scleroderma (HCC)    Seizures (HCC)    1990 last seizures on meds Phenobarb   Shingles late 90's   Skin cancer    squamous cell   Thyroid  nodule    Right thyroid  lobe, only seen on Sagittal imaging measures 2.3 cm in craniocaudal dimension and appears stable   Tubular adenoma    Vitamin D  deficiency     Past Surgical History:  Procedure Laterality Date   ABDOMINAL HYSTERECTOMY     ANTERIOR FUSION CLIVUS-C2 EXTRAORAL W/ ODONTOID EXCISION  12/2012  APPENDECTOMY     ARTHROSCOPIC REPAIR ACL Left 1976   BASAL CELL CARCINOMA EXCISION     face   BLADDER SUSPENSION     BREAST DUCTAL SYSTEM EXCISION Right 01/15/2014   Procedure: EXCISION DUCTAL SYSTEM RIGHT BREAST;  Surgeon: Jina Nephew, MD;  Location: [facility];  Service: General;  Laterality: Right;   BREAST EXCISIONAL BIOPSY Right    CARPAL TUNNEL RELEASE Bilateral    B, mult times   CATARACT EXTRACTION     bilateral   cervical neck ablation     x 7, C3-C6/3 screws and plate    CESAREAN SECTION     CYSTOCELE REPAIR     CYSTOSCOPY/URETEROSCOPY/HOLMIUM LASER/STENT PLACEMENT Right 09/19/2023   Procedure: CYSTOSCOPY/URETEROSCOPY/BASKETING OF STONES;  Surgeon: Alvaro Ricardo KATHEE Mickey., MD;  Location: [facility];  Service: Urology;  Laterality: Right;   DILATION AND CURETTAGE OF UTERUS     duptyren's contracture right hand     FINGER ARTHRODESIS Right 05/06/2015   Procedure: RIGHT RING PROXIMAL INTERPHALANGEAL FUSION (PIP);  Surgeon: Franky Curia, MD;  Location: [facility];  Service: Orthopedics;  Laterality: Right;   FINGER GANGLION CYST EXCISION     right   HEMICOLECTOMY Right    JOINT REPLACEMENT     right and left basal joints of thumbs   left finger fusion     3 fingers on left hand/one finger right   left ovary and tube removed     NASAL POLYP SURGERY     4 sinus surgeries   NASAL SEPTUM SURGERY     PANNICULECTOMY     PROXIMAL INTERPHALANGEAL FUSION (PIP) Left 09/09/2013   Procedure: FUSION LEFT INDEX PROXIMAL INTERPHALANGEAL JOINT (PIP);  Surgeon: Lamar LULLA Leonor Mickey., MD;  Location: [facility];  Service: Orthopedics;  Laterality: Left;   right median nerve decompression     SHOULDER ARTHROSCOPY     x2 left, 1 right   sinus surgeries     x 4   squamous lesions removed     neck and face   STERIOD INJECTION Right 05/06/2015   Procedure: STEROID INJECTION;  Surgeon: Franky Curia, MD;  Location: [facility];  Service: Orthopedics;  Laterality: Right;  Right Index Finger Proximal InterPhalangeal Joint Injection   TONSILLECTOMY AND ADENOIDECTOMY     TUBAL LIGATION     vocal polyps removed       Current Outpatient Medications:    albuterol  (PROVENTIL ) (2.5 MG/3ML) 0.083% nebulizer solution, Take 2.5 mg by nebulization every 4 (four) hours as needed for shortness of breath or wheezing., Disp: , Rfl:    arformoterol  (BROVANA ) 15 MCG/2ML NEBU, Take 15 mcg by nebulization in the morning and at bedtime., Disp: , Rfl:     budesonide  (PULMICORT ) 0.25 MG/2ML nebulizer solution, Take 0.25 mg by nebulization in the morning and at bedtime., Disp: , Rfl:    CALCIUM  PO, Take 600 mg by mouth daily., Disp: , Rfl:    carbamazepine  (TEGRETOL  XR) 200 MG 12 hr tablet, Take 200 mg by mouth at bedtime., Disp: , Rfl:    Chlorpheniramine Maleate (ALLERGY PO), Take 2 tablets by mouth at bedtime. Takes 2 [place] Allergy Pills at night, Disp: , Rfl:    Cholecalciferol 25 MCG (1000 UT) capsule, Take 1,000 Units by mouth daily., Disp: , Rfl:    Docusate Sodium  (COLACE PO), Take 1 tablet by mouth daily., Disp: , Rfl:    gabapentin  (NEURONTIN ) 300 MG capsule, Take 300 mg by mouth at  bedtime., Disp: , Rfl:    levothyroxine  (SYNTHROID ) 50 MCG tablet, TAKE 1 TABLET(50 MCG) BY MOUTH DAILY BEFORE BREAKFAST, Disp: 90 tablet, Rfl: 3   mometasone  (NASONEX ) 50 MCG/ACT nasal spray, Place 2 sprays into the nose in the morning and at bedtime., Disp: , Rfl:    NONFORMULARY OR COMPOUNDED ITEM, Take 1 Inhalation by mouth daily. Mometasone /levofloxacin compounded inhalation, Disp: , Rfl:    ondansetron  (ZOFRAN ) 4 MG tablet, Take 4 mg by mouth every 8 (eight) hours as needed for nausea., Disp: , Rfl:    predniSONE  (DELTASONE ) 5 MG tablet, Take 10 mg by mouth daily., Disp: , Rfl:    Romosozumab -aqqg (EVENITY ) 105 MG/1. SOSY injection, Inject 210 mg into the skin every 30 (thirty) days., Disp: , Rfl:    VENTOLIN  HFA 108 (90 Base) MCG/ACT inhaler, Inhale 1-2 puffs into the lungs every 4 (four) hours as needed for wheezing or shortness of breath., Disp: , Rfl:    amLODipine  (NORVASC ) 10 MG tablet, Take 1 tablet (10 mg total) by mouth daily., Disp: 90 tablet, Rfl: 3   atorvastatin  (LIPITOR) 40 MG tablet, Take 1 tablet (40 mg total) by mouth at bedtime., Disp: 90 tablet, Rfl: 3   montelukast  (SINGULAIR ) 10 MG tablet, Take 1 tablet (10 mg total) by mouth at bedtime., Disp: 90 tablet, Rfl: 3   PHENobarbital  (LUMINAL) 32.4 MG tablet, TAKE 1 TABLET BY MOUTH  DAILY AT 6:00AM AND 2 TABLETS BY MOUTH AT 8:00PM, Disp: 270 tablet, Rfl: 0  Current Facility-Administered Medications:    Romosozumab -aqqg (EVENITY ) 105 MG/1. injection 210 mg, 210 mg, Subcutaneous, Once, Wendolyn Jenkins Jansky, MD   NOREEN ON 03/13/2024] Romosozumab -aqqg (EVENITY ) 105 MG/1. injection 210 mg, 210 mg, Subcutaneous, Q30 days, Wendolyn Jenkins Jansky, MD  Allergies  Allergen Reactions   Iodinated Contrast Media Anaphylaxis    Cardiac arrest   Iodine Other (See Comments)    Cardiac arrest As young adult, received iodinated contrast for IVP, went into cardiac arrest, was resuscitated and hospitalized. Has not received iodinated contrast since that episode. Reports no adverse reaction to betadine.    Metrizamide Other (See Comments)    Cardiac arrest     Morphine Other (See Comments)    Cardiac arrest Patient has previously tolerated hydrocodone, hydromorphone  and fentanyl     Morphine And Codeine Other (See Comments)    Cardiac arrest   Cocklebur    Lambs Quarters    Mugwort    Sheep Sorrel    Spiny Pigweed (Amaranthus Spinosus) Skin Test    Tramadol  Other (See Comments)    Causing severe constipation    ROS neg/noncontributory except as noted HPI/below      Objective:     BP 127/73   Pulse 83   Temp 97.6 F (36.4 C) (Temporal)   Resp 18   Ht 5' 2 (1.575 m)   Wt 133 lb (60.3 kg)   SpO2 98%   BMI 24.33 kg/m  Wt Readings from Last 3 Encounters:  02/12/24 133 lb (60.3 kg)  01/17/24 134 lb (60.8 kg)  11/22/23 128 lb (58.1 kg)    Physical Exam   Gen: WDWN NAD HEENT: NCAT, conjunctiva not injected, sclera nonicteric NECK:  supple, no thyromegaly, no nodes, no carotid bruits CARDIAC: RRR, S1S2+, no murmur. DP 2+B LUNGS: CTAB. No wheezes ABDOMEN:  BS+, soft, NTND, No HSM, no masses EXT:  no edema MSK: no gross abnormalities.   walker NEURO: A&O x3.  CN II-XII intact.  PSYCH: normal mood. Good eye contact Clubbed  fingernails  Reviewed labs and onc  notes     Assessment & Plan:  Need for influenza vaccination -     Flu vaccine HIGH DOSE PF(Fluzone Trivalent)  Age-related osteoporosis without current pathological fracture -     Romosozumab -aqqg  Essential hypertension -     amLODIPine  Besylate; Take 1 tablet (10 mg total) by mouth daily.  Dispense: 90 tablet; Refill: 3  Seizures (HCC) -     PHENobarbital ; TAKE 1 TABLET BY MOUTH DAILY AT 6:00AM AND 2 TABLETS BY MOUTH AT 8:00PM  Dispense: 270 tablet; Refill: 0  Acquired hypothyroidism  Chronic inflammatory demyelinating neuropathy (HCC)  Scleroderma (HCC)  Primary hypertension  Pure hypercholesterolemia -     Atorvastatin  Calcium ; Take 1 tablet (40 mg total) by mouth at bedtime.  Dispense: 90 tablet; Refill: 3  Other orders -     Montelukast  Sodium; Take 1 tablet (10 mg total) by mouth at bedtime.  Dispense: 90 tablet; Refill: 3  Assessment and Plan Assessment & Plan Neurogenic bladder with urinary retention   Chronic neurogenic bladder is managed with intermittent catheterization three times a day. There are no signs of urinary tract infection. A similar condition resolved spontaneously 10 years ago. Continue intermittent catheterization three times a day.  Seizure disorder   Her seizure disorder is well-controlled with phenobarbital  and carbamazepine . No breakthrough seizures have been reported. The neurologist has not advised discontinuation of carbamazepine  despite its absence from the medication list. Refill phenobarbital  and carbamazepine  prescriptions.  Chronic obstructive pulmonary disease (COPD)   COPD is well-managed on the current regimen, including prednisone , albuterol , Pulmicort , Brovana , and montelukast . There have been no recent exacerbations or respiratory distress.  Chronic fatigue   She experiences chronic fatigue with new onset of increased tiredness requiring daily naps. There are no signs of depression or memory issues. The etiology is possibly  multifactorial, including age, medication side effects, and COPD. Encourage taking naps as needed.  Hypertension   Hypertension is well-controlled with amlodipine . Home blood pressure readings are within normal range at 130/70 mmHg. She reports no symptoms of headache, dizziness, chest pain, or shortness of breath.  Hypothyroidism   Hypothyroidism is well-managed on levothyroxine  50 mcg daily. Recent thyroid  function tests are within normal limits.  Hyperlipidemia   Hyperlipidemia is well-managed on atorvastatin  40 mg daily. No recent cholesterol levels are available, but no issues are reported. Check cholesterol levels in 6 months.  Scleroderma with hand contractures  Osteoporosis   Osteoporosis is managed with Evenity  and Prolia . She received Evenity  and a flu shot today and is scheduled to switch back to Prolia  after December.  Anemia   Anemia is well-managed with recent labs showing normal ferritin levels. No current iron supplementation is required. Follow-up with a hematologist in 3 months.    Return in about 6 months (around 08/11/2024) for chronic follow-up.     oct and nov nurse visit for evenity -after the 16th.  Jenkins CHRISTELLA Carrel, MD

## 2024-02-12 NOTE — Patient Instructions (Signed)

## 2024-02-19 DIAGNOSIS — Z23 Encounter for immunization: Secondary | ICD-10-CM | POA: Diagnosis not present

## 2024-03-18 ENCOUNTER — Ambulatory Visit

## 2024-03-18 DIAGNOSIS — M810 Age-related osteoporosis without current pathological fracture: Secondary | ICD-10-CM | POA: Diagnosis not present

## 2024-03-18 MED ORDER — ROMOSOZUMAB-AQQG 105 MG/1.17ML ~~LOC~~ SOSY
210.0000 mg | PREFILLED_SYRINGE | Freq: Once | SUBCUTANEOUS | Status: AC
  Administered 2024-04-29: 210 mg via SUBCUTANEOUS

## 2024-03-18 NOTE — Progress Notes (Signed)
 Pt came in on the Nurse schedule to receive her Evenity  injection per Dr. Wendolyn. Administered in left/rt arm without any complaints. Pt was advised to schedule her next Evenity  injection. Pt understood.

## 2024-03-20 ENCOUNTER — Other Ambulatory Visit: Payer: Self-pay | Admitting: Family Medicine

## 2024-03-20 ENCOUNTER — Other Ambulatory Visit: Payer: Self-pay | Admitting: Family

## 2024-03-20 DIAGNOSIS — R569 Unspecified convulsions: Secondary | ICD-10-CM

## 2024-04-02 DIAGNOSIS — L648 Other androgenic alopecia: Secondary | ICD-10-CM | POA: Diagnosis not present

## 2024-04-02 DIAGNOSIS — C44622 Squamous cell carcinoma of skin of right upper limb, including shoulder: Secondary | ICD-10-CM | POA: Diagnosis not present

## 2024-04-02 DIAGNOSIS — I788 Other diseases of capillaries: Secondary | ICD-10-CM | POA: Diagnosis not present

## 2024-04-02 DIAGNOSIS — D485 Neoplasm of uncertain behavior of skin: Secondary | ICD-10-CM | POA: Diagnosis not present

## 2024-04-03 ENCOUNTER — Inpatient Hospital Stay

## 2024-04-03 ENCOUNTER — Encounter: Payer: Self-pay | Admitting: Hematology & Oncology

## 2024-04-03 ENCOUNTER — Other Ambulatory Visit: Payer: Self-pay

## 2024-04-03 ENCOUNTER — Inpatient Hospital Stay: Attending: Hematology & Oncology

## 2024-04-03 ENCOUNTER — Inpatient Hospital Stay (HOSPITAL_BASED_OUTPATIENT_CLINIC_OR_DEPARTMENT_OTHER): Admitting: Hematology & Oncology

## 2024-04-03 VITALS — BP 132/57 | HR 82 | Temp 98.5°F | Resp 18 | Ht 62.0 in | Wt 132.0 lb

## 2024-04-03 DIAGNOSIS — K5900 Constipation, unspecified: Secondary | ICD-10-CM | POA: Insufficient documentation

## 2024-04-03 DIAGNOSIS — D61818 Other pancytopenia: Secondary | ICD-10-CM | POA: Diagnosis not present

## 2024-04-03 DIAGNOSIS — D508 Other iron deficiency anemias: Secondary | ICD-10-CM

## 2024-04-03 DIAGNOSIS — N319 Neuromuscular dysfunction of bladder, unspecified: Secondary | ICD-10-CM

## 2024-04-03 DIAGNOSIS — E039 Hypothyroidism, unspecified: Secondary | ICD-10-CM | POA: Diagnosis not present

## 2024-04-03 DIAGNOSIS — D751 Secondary polycythemia: Secondary | ICD-10-CM | POA: Insufficient documentation

## 2024-04-03 DIAGNOSIS — R748 Abnormal levels of other serum enzymes: Secondary | ICD-10-CM

## 2024-04-03 LAB — CMP (CANCER CENTER ONLY)
ALT: 33 U/L (ref 0–44)
AST: 47 U/L — ABNORMAL HIGH (ref 15–41)
Albumin: 4.3 g/dL (ref 3.5–5.0)
Alkaline Phosphatase: 112 U/L (ref 38–126)
Anion gap: 10 (ref 5–15)
BUN: 17 mg/dL (ref 8–23)
CO2: 25 mmol/L (ref 22–32)
Calcium: 9.3 mg/dL (ref 8.9–10.3)
Chloride: 108 mmol/L (ref 98–111)
Creatinine: 0.91 mg/dL (ref 0.44–1.00)
GFR, Estimated: >60 mL/min (ref >60)
Glucose, Bld: 98 mg/dL (ref 70–99)
Potassium: 4.8 mmol/L (ref 3.5–5.1)
Sodium: 142 mmol/L (ref 135–145)
Total Bilirubin: 0.3 mg/dL (ref 0.0–1.2)
Total Protein: 7.1 g/dL (ref 6.5–8.1)

## 2024-04-03 LAB — CBC WITH DIFFERENTIAL (CANCER CENTER ONLY)
Abs Immature Granulocytes: 0.01 K/uL (ref 0.00–0.07)
Basophils Absolute: 0 K/uL (ref 0.0–0.1)
Basophils Relative: 1 %
Eosinophils Absolute: 0 K/uL (ref 0.0–0.5)
Eosinophils Relative: 1 %
HCT: 42.1 % (ref 36.0–46.0)
Hemoglobin: 13.9 g/dL (ref 12.0–15.0)
Immature Granulocytes: 0 %
Lymphocytes Relative: 31 %
Lymphs Abs: 1.6 K/uL (ref 0.7–4.0)
MCH: 30.7 pg (ref 26.0–34.0)
MCHC: 33 g/dL (ref 30.0–36.0)
MCV: 92.9 fL (ref 80.0–100.0)
Monocytes Absolute: 0.3 K/uL (ref 0.1–1.0)
Monocytes Relative: 7 %
Neutro Abs: 3.1 K/uL (ref 1.7–7.7)
Neutrophils Relative %: 60 %
Platelet Count: 202 K/uL (ref 150–400)
RBC: 4.53 MIL/uL (ref 3.87–5.11)
RDW: 13.7 % (ref 11.5–15.5)
WBC Count: 5.1 K/uL (ref 4.0–10.5)
nRBC: 0 % (ref 0.0–0.2)

## 2024-04-03 LAB — IRON AND IRON BINDING CAPACITY (CC-WL,HP ONLY)
Iron: 63 ug/dL (ref 28–170)
Saturation Ratios: 20 % (ref 10.4–31.8)
TIBC: 315 ug/dL (ref 250–450)
UIBC: 252 ug/dL

## 2024-04-03 LAB — FERRITIN: Ferritin: 41 ng/mL (ref 11–307)

## 2024-04-03 LAB — VITAMIN B12: Vitamin B-12: 441 pg/mL (ref 180–914)

## 2024-04-03 NOTE — Progress Notes (Signed)
 Hematology and Oncology Follow Up Visit  [name] [id number] 09-19-1932 89 y.o. 04/03/2024   Principle Diagnosis:  Transient erythrocytosis - JAK2 (-)  Current Therapy:   Observation     Interim History:  [name] is back for follow-up.  Everything seems to be going pretty well for her.  We last saw her back in August.  At that time, everything was doing okay.  She really has had no complaints since we last saw her.  I think she may have a little bit of issues with constipation.  When we last saw her, her ferritin was 64 with an iron saturation of 27%.  She still gets around with a walker.  She does have some neurological issues.  She has had no bleeding.  Has been no change in bowel or bladder habits.  She has had no leg swelling.  She has had no fever.  She has avoided COVID.  Currently, I would have to say that her performance [facility] ECOG 2.   Medications:  Current Outpatient Medications:    albuterol  (PROVENTIL ) (2.5 MG/3ML) 0.083% nebulizer solution, Take 2.5 mg by nebulization every 4 (four) hours as needed for shortness of breath or wheezing., Disp: , Rfl:    amLODipine  (NORVASC ) 10 MG tablet, Take 1 tablet (10 mg total) by mouth daily., Disp: 90 tablet, Rfl: 3   arformoterol  (BROVANA ) 15 MCG/2ML NEBU, Take 15 mcg by nebulization in the morning and at bedtime., Disp: , Rfl:    atorvastatin  (LIPITOR) 40 MG tablet, Take 1 tablet (40 mg total) by mouth at bedtime., Disp: 90 tablet, Rfl: 3   budesonide  (PULMICORT ) 0.25 MG/2ML nebulizer solution, Take 0.25 mg by nebulization in the morning and at bedtime., Disp: , Rfl:    CALCIUM  PO, Take 600 mg by mouth daily., Disp: , Rfl:    carbamazepine  (TEGRETOL  XR) 200 MG 12 hr tablet, Take 200 mg by mouth at bedtime., Disp: , Rfl:    Chlorpheniramine Maleate (ALLERGY PO), Take 2 tablets by mouth at bedtime. Takes 2 [facility] Allergy Pills at night, Disp: , Rfl:    Cholecalciferol 25 MCG (1000 UT) capsule, Take 1,000 Units  by mouth daily., Disp: , Rfl:    Docusate Sodium  (COLACE PO), Take 1 tablet by mouth daily., Disp: , Rfl:    gabapentin  (NEURONTIN ) 300 MG capsule, Take 300 mg by mouth at bedtime., Disp: , Rfl:    levothyroxine  (SYNTHROID ) 50 MCG tablet, TAKE 1 TABLET(50 MCG) BY MOUTH DAILY BEFORE BREAKFAST, Disp: 90 tablet, Rfl: 3   mometasone  (NASONEX ) 50 MCG/ACT nasal spray, Place 2 sprays into the nose in the morning and at bedtime., Disp: , Rfl:    montelukast  (SINGULAIR ) 10 MG tablet, TAKE 1 TABLET(10 MG) BY MOUTH AT BEDTIME, Disp: 90 tablet, Rfl: 3   NONFORMULARY OR COMPOUNDED ITEM, Take 1 Inhalation by mouth daily. Mometasone /levofloxacin compounded inhalation, Disp: , Rfl:    ondansetron  (ZOFRAN ) 4 MG tablet, Take 4 mg by mouth every 8 (eight) hours as needed for nausea., Disp: , Rfl:    PHENobarbital  (LUMINAL) 32.4 MG tablet, TAKE 1 TABLET BY MOUTH DAILY AT 6:00AM AND 2 TABLETS BY MOUTH AT 8:00PM, Disp: 270 tablet, Rfl: 0   predniSONE  (DELTASONE ) 5 MG tablet, Take 10 mg by mouth daily., Disp: , Rfl:    VENTOLIN  HFA 108 (90 Base) MCG/ACT inhaler, Inhale 1-2 puffs into the lungs every 4 (four) hours as needed for wheezing or shortness of breath., Disp: , Rfl:   Current Facility-Administered Medications:  Romosozumab -aqqg (EVENITY ) 105 MG/1. injection 210 mg, 210 mg, Subcutaneous, Once, Wendolyn Jenkins Jansky, MD   NOREEN ON 04/18/2024] Romosozumab -aqqg (EVENITY ) 105 MG/1. injection 210 mg, 210 mg, Subcutaneous, Once, Wendolyn Jenkins Jansky, MD  Allergies:  Allergies  Allergen Reactions   Iodinated Contrast Media Anaphylaxis    Cardiac arrest   Iodine Other (See Comments)    Cardiac arrest As young adult, received iodinated contrast for IVP, went into cardiac arrest, was resuscitated and hospitalized. Has not received iodinated contrast since that episode. Reports no adverse reaction to betadine.    Metrizamide Other (See Comments)    Cardiac arrest     Morphine Other (See Comments)    Cardiac  arrest Patient has previously tolerated hydrocodone, hydromorphone  and fentanyl     Morphine And Codeine Other (See Comments)    Cardiac arrest   Cocklebur    Lambs Quarters    Mugwort    Sheep Sorrel    Spiny Pigweed (Amaranthus Spinosus) Skin Test    Tramadol  Other (See Comments)    Causing severe constipation     Past Medical History, Surgical history, Social history, and Family History were reviewed and updated.  Review of Systems: Review of Systems  Constitutional: Negative.  Negative for appetite change, fatigue, fever and unexpected weight change.  HENT:  Negative.  Negative for lump/mass, mouth sores, sore throat and trouble swallowing.   Eyes: Negative.   Respiratory: Negative.  Negative for cough, hemoptysis and shortness of breath.   Cardiovascular: Negative.  Negative for leg swelling and palpitations.  Gastrointestinal:  Positive for diarrhea. Negative for abdominal distention, abdominal pain, blood in stool, constipation, nausea and vomiting.  Endocrine: Negative.   Genitourinary: Negative.  Negative for bladder incontinence, dysuria, frequency and hematuria.   Musculoskeletal:  Positive for gait problem. Negative for arthralgias, back pain and myalgias.  Skin: Negative.  Negative for itching and rash.  Neurological:  Positive for extremity weakness and gait problem. Negative for dizziness, headaches, numbness, seizures and speech difficulty.  Hematological: Negative.  Does not bruise/bleed easily.  Psychiatric/Behavioral: Negative.  Negative for depression and sleep disturbance. The patient is not nervous/anxious.     Physical Exam:  height is 5' 2 (1.575 m) and weight is 132 lb (59.9 kg). Her oral temperature is 98.5 F (36.9 C). Her blood pressure is 132/57 (abnormal) and her pulse is 82. Her respiration is 18 and oxygen saturation is 100%.   Wt Readings from Last 3 Encounters:  04/03/24 132 lb (59.9 kg)  02/12/24 133 lb (60.3 kg)  01/17/24 134 lb (60.8 kg)     Physical Exam Vitals reviewed.  HENT:     Head: Normocephalic and atraumatic.  Eyes:     Pupils: Pupils are equal, round, and reactive to light.  Cardiovascular:     Rate and Rhythm: Normal rate and regular rhythm.     Heart sounds: Normal heart sounds.  Pulmonary:     Effort: Pulmonary effort is normal.     Breath sounds: Normal breath sounds.  Abdominal:     General: Bowel sounds are normal.     Palpations: Abdomen is soft.  Musculoskeletal:        General: No tenderness or deformity. Normal range of motion.     Cervical back: Normal range of motion.  Lymphadenopathy:     Cervical: No cervical adenopathy.  Skin:    General: Skin is warm and dry.     Findings: No erythema or rash.  Neurological:     Mental Status: She  is alert and oriented to person, place, and time.  Psychiatric:        Behavior: Behavior normal.        Thought Content: Thought content normal.        Judgment: Judgment normal.      Lab Results  Component Value Date   WBC 5.1 04/03/2024   HGB 13.9 04/03/2024   HCT 42.1 04/03/2024   MCV 92.9 04/03/2024   PLT 202 04/03/2024     Chemistry      Component Value Date/Time   NA 142 04/03/2024 1328   K 4.8 04/03/2024 1328   CL 108 04/03/2024 1328   CO2 25 04/03/2024 1328   BUN 17 04/03/2024 1328   CREATININE 0.91 04/03/2024 1328   CREATININE 0.78 06/27/2012 1139      Component Value Date/Time   CALCIUM  9.3 04/03/2024 1328   ALKPHOS 112 04/03/2024 1328   AST 47 (H) 04/03/2024 1328   ALT 33 04/03/2024 1328   BILITOT 0.3 04/03/2024 1328      Impression and Plan:  Ms. Doby is a very charming 89 year old white female.  She had transient erythrocytosis.  So far, her testing really has not been positive for any thing that looks like a myeloproliferative disorder.  She does not need to be phlebotomized at all.  We will check her iron studies.  We will plan to get her back after the Holiday season.    Maude JONELLE Crease, MD 11/6/20252:17  PM

## 2024-04-04 ENCOUNTER — Ambulatory Visit: Payer: Self-pay | Admitting: Hematology & Oncology

## 2024-04-09 ENCOUNTER — Other Ambulatory Visit: Payer: Self-pay | Admitting: Family Medicine

## 2024-04-09 DIAGNOSIS — I10 Essential (primary) hypertension: Secondary | ICD-10-CM

## 2024-04-22 DIAGNOSIS — M3481 Systemic sclerosis with lung involvement: Secondary | ICD-10-CM | POA: Diagnosis not present

## 2024-04-22 DIAGNOSIS — M349 Systemic sclerosis, unspecified: Secondary | ICD-10-CM | POA: Diagnosis not present

## 2024-04-29 ENCOUNTER — Ambulatory Visit

## 2024-04-29 DIAGNOSIS — M810 Age-related osteoporosis without current pathological fracture: Secondary | ICD-10-CM | POA: Diagnosis not present

## 2024-04-29 MED ORDER — ROMOSOZUMAB-AQQG 105 MG/1.17ML ~~LOC~~ SOSY
210.0000 mg | PREFILLED_SYRINGE | SUBCUTANEOUS | Status: AC
  Administered 2024-06-03: 210 mg via SUBCUTANEOUS

## 2024-04-29 NOTE — Progress Notes (Signed)
 Patient is in office today for a nurse visit for Evenity . Patient Injection was given in the  Right deltoid. Patient tolerated injection well.  Patient is in office today for a nurse visit for Evenity . Patient Injection was given in the  Left deltoid. Patient tolerated injection well.

## 2024-05-06 DIAGNOSIS — C44722 Squamous cell carcinoma of skin of right lower limb, including hip: Secondary | ICD-10-CM | POA: Diagnosis not present

## 2024-05-09 DIAGNOSIS — R3914 Feeling of incomplete bladder emptying: Secondary | ICD-10-CM | POA: Diagnosis not present

## 2024-05-09 DIAGNOSIS — N3946 Mixed incontinence: Secondary | ICD-10-CM | POA: Diagnosis not present

## 2024-05-09 DIAGNOSIS — N319 Neuromuscular dysfunction of bladder, unspecified: Secondary | ICD-10-CM | POA: Diagnosis not present

## 2024-05-09 DIAGNOSIS — J449 Chronic obstructive pulmonary disease, unspecified: Secondary | ICD-10-CM | POA: Diagnosis not present

## 2024-05-13 ENCOUNTER — Telehealth: Payer: Self-pay

## 2024-05-13 NOTE — Telephone Encounter (Signed)
 Copied from CRM #[id number]. Topic: Clinical - Medical Advice >> May 13, 2024  2:06 PM Thersia BROCKS wrote: Reason for CRM: Patient called in stated she is having troublesleeping, wants to know what should dhe get for this as she takes right much medication   [phone]

## 2024-05-30 ENCOUNTER — Telehealth: Payer: Self-pay

## 2024-05-30 NOTE — Telephone Encounter (Signed)
 Pt ready for scheduling for EVENITY  on or after : 05/30/24  Option# 1 Buy/Bill (Office supplied medication)  Out-of-pocket cost due at time of  office visit: $0  Number of injection/visits approved: ---  Primary: MEDICARE Evenity  co-insurance: 0% Admin fee co-insurance: 0%  Secondary: [employer] Even[facility]  co-insurance:  Admin fee co-insurance:   Medical Benefit Details: Date Benefits were checked: 05/30/24 Deductible: $0 Met of $283 Required/ Coinsurance: 0%/ Admin Fee: 0%  Prior Auth: N/A PA# Expiration Date:   # of doses approved: ------------------------------------------------------------------------- Option# 2- Med Obtained from pharmacy  Pharmacy benefit: Copay $--- (Paid to pharmacy) Admin Fee: --- (Pay at clinic)  Prior Auth: --- PA# Expiration Date:   # of doses approved:  If patient wants fill through the pharmacy benefit please send prescription to: ---, and include estimated need by date in rx notes. Pharmacy will ship medication directly to the office.  Patient NOT eligible for Even[facility]  Copay Card. Copay Card can make patient's cost as little as $25. Link to apply: [email or web address]   This summary of benefits is an estimation of the patient's out-of-pocket cost. Exact cost may very based on individual plan coverage.

## 2024-05-30 NOTE — Telephone Encounter (Signed)
 [name]

## 2024-05-30 NOTE — Telephone Encounter (Signed)
 Evenity  VOB initiated via [email or web address]  Last Evenity  inj: 04/29/24 Next Evenity  inj DUE: 05/30/24

## 2024-06-03 ENCOUNTER — Ambulatory Visit

## 2024-06-03 DIAGNOSIS — M810 Age-related osteoporosis without current pathological fracture: Secondary | ICD-10-CM | POA: Diagnosis not present

## 2024-06-03 MED ORDER — ROMOSOZUMAB-AQQG 105 MG/1.17ML ~~LOC~~ SOSY: 210.0000 mg | PREFILLED_SYRINGE | SUBCUTANEOUS | Status: AC

## 2024-06-03 NOTE — Progress Notes (Signed)
 Patient is in office today for a nurse visit for Evenity . Patient Injection was given in the  Left arm. Patient tolerated injection well. Patient is in office today for a nurse visit for Evenity . Patient Injection was given in the  Right arm. Patient tolerated injection well.

## 2024-06-03 NOTE — Progress Notes (Signed)
 Patient is in office today for a nurse visit for Evenity . Patient Injection was given in the  Left arm. Patient tolerated injection well.

## 2024-06-03 NOTE — Addendum Note (Signed)
 Addended by: CRISTOPHER MAUS C on: 06/03/2024 10:21 AM   Modules accepted: Orders

## 2024-07-04 ENCOUNTER — Encounter: Payer: Self-pay | Admitting: Hematology & Oncology

## 2024-07-04 ENCOUNTER — Inpatient Hospital Stay: Admitting: Hematology & Oncology

## 2024-07-04 ENCOUNTER — Inpatient Hospital Stay: Attending: Hematology & Oncology

## 2024-07-04 ENCOUNTER — Other Ambulatory Visit: Payer: Self-pay

## 2024-07-04 VITALS — BP 138/58 | HR 78 | Temp 98.3°F | Resp 16 | Ht 62.0 in | Wt 136.0 lb

## 2024-07-04 DIAGNOSIS — R7871 Abnormal lead level in blood: Secondary | ICD-10-CM

## 2024-07-04 DIAGNOSIS — N816 Rectocele: Secondary | ICD-10-CM

## 2024-07-04 DIAGNOSIS — D508 Other iron deficiency anemias: Secondary | ICD-10-CM

## 2024-07-04 DIAGNOSIS — E039 Hypothyroidism, unspecified: Secondary | ICD-10-CM

## 2024-07-04 LAB — FERRITIN: Ferritin: 80 ng/mL (ref 11–307)

## 2024-07-04 LAB — CMP (CANCER CENTER ONLY)
ALT: 36 U/L (ref 0–44)
AST: 46 U/L — ABNORMAL HIGH (ref 15–41)
Albumin: 4.4 g/dL (ref 3.5–5.0)
Alkaline Phosphatase: 105 U/L (ref 38–126)
Anion gap: 10 (ref 5–15)
BUN: 26 mg/dL — ABNORMAL HIGH (ref 8–23)
CO2: 27 mmol/L (ref 22–32)
Calcium: 9.6 mg/dL (ref 8.9–10.3)
Chloride: 108 mmol/L (ref 98–111)
Creatinine: 0.86 mg/dL (ref 0.44–1.00)
GFR, Estimated: >60 mL/min
Glucose, Bld: 86 mg/dL (ref 70–99)
Potassium: 5.2 mmol/L — ABNORMAL HIGH (ref 3.5–5.1)
Sodium: 144 mmol/L (ref 135–145)
Total Bilirubin: 0.4 mg/dL (ref 0.0–1.2)
Total Protein: 7.6 g/dL (ref 6.5–8.1)

## 2024-07-04 LAB — CBC WITH DIFFERENTIAL (CANCER CENTER ONLY)
Abs Immature Granulocytes: 0.02 10*3/uL (ref 0.00–0.07)
Basophils Absolute: 0 10*3/uL (ref 0.0–0.1)
Basophils Relative: 1 %
Eosinophils Absolute: 0.1 10*3/uL (ref 0.0–0.5)
Eosinophils Relative: 1 %
HCT: 43.7 % (ref 36.0–46.0)
Hemoglobin: 14.3 g/dL (ref 12.0–15.0)
Immature Granulocytes: 0 %
Lymphocytes Relative: 28 %
Lymphs Abs: 1.8 10*3/uL (ref 0.7–4.0)
MCH: 30.6 pg (ref 26.0–34.0)
MCHC: 32.7 g/dL (ref 30.0–36.0)
MCV: 93.4 fL (ref 80.0–100.0)
Monocytes Absolute: 0.4 10*3/uL (ref 0.1–1.0)
Monocytes Relative: 6 %
Neutro Abs: 4 10*3/uL (ref 1.7–7.7)
Neutrophils Relative %: 64 %
Platelet Count: 212 10*3/uL (ref 150–400)
RBC: 4.68 MIL/uL (ref 3.87–5.11)
RDW: 13.1 % (ref 11.5–15.5)
WBC Count: 6.3 10*3/uL (ref 4.0–10.5)
nRBC: 0 % (ref 0.0–0.2)

## 2024-07-04 LAB — IRON AND IRON BINDING CAPACITY (CC-WL,HP ONLY)
Iron: 141 ug/dL (ref 28–170)
Saturation Ratios: 44 % — ABNORMAL HIGH (ref 10.4–31.8)
TIBC: 323 ug/dL (ref 250–450)
UIBC: 182 ug/dL

## 2024-07-04 NOTE — Progress Notes (Signed)
 " Hematology and Oncology Follow Up Visit  [name] [id number] 26-Dec-1932 89 y.o. 07/04/2024   Principle Diagnosis:  Transient erythrocytosis - JAK2 (-)  Current Therapy:   Observation     Interim History:  Ms. [name] is back for follow-up.  We last saw her back in November.  She got through the Holiday season okay.  Her husband has a little bit of a cold right now.  She is feeling okay.  She really has had no complaints.  When we last saw her, her ferritin was 41 with an iron saturation of 20%.  She is having no problems with fever.  She has had no change in bowel or bladder habits.  She has had no rashes.  She does have a few ecchymoses on her lower extremities.  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  She has had no visual changes.  She has had no swollen lymph nodes.  She has had no weight loss or weight gain.  Overall, I would say that her performance status is probably ECOG 1.    Medications:  Current Outpatient Medications:    albuterol  (PROVENTIL ) (2.5 MG/3ML) 0.083% nebulizer solution, Take 2.5 mg by nebulization every 4 (four) hours as needed for shortness of breath or wheezing., Disp: , Rfl:    amLODipine  (NORVASC ) 10 MG tablet, TAKE 1 TABLET BY MOUTH DAILY, Disp: 90 tablet, Rfl: 3   arformoterol  (BROVANA ) 15 MCG/2ML NEBU, Take 15 mcg by nebulization in the morning and at bedtime., Disp: , Rfl:    atorvastatin  (LIPITOR) 40 MG tablet, Take 1 tablet (40 mg total) by mouth at bedtime., Disp: 90 tablet, Rfl: 3   budesonide  (PULMICORT ) 0.25 MG/2ML nebulizer solution, Take 0.25 mg by nebulization in the morning and at bedtime., Disp: , Rfl:    CALCIUM  PO, Take 600 mg by mouth daily., Disp: , Rfl:    carbamazepine  (TEGRETOL  XR) 200 MG 12 hr tablet, Take 200 mg by mouth at bedtime., Disp: , Rfl:    Chlorpheniramine Maleate (ALLERGY PO), Take 2 tablets by mouth at bedtime. Takes 2 [facility] Allergy Pills at night, Disp: , Rfl:     Cholecalciferol 25 MCG (1000 UT) capsule, Take 1,000 Units by mouth daily., Disp: , Rfl:    Docusate Sodium  (COLACE PO), Take 1 tablet by mouth daily., Disp: , Rfl:    gabapentin  (NEURONTIN ) 300 MG capsule, Take 300 mg by mouth at bedtime., Disp: , Rfl:    levothyroxine  (SYNTHROID ) 50 MCG tablet, TAKE 1 TABLET(50 MCG) BY MOUTH DAILY BEFORE BREAKFAST, Disp: 90 tablet, Rfl: 3   mometasone  (NASONEX ) 50 MCG/ACT nasal spray, Place 2 sprays into the nose in the morning and at bedtime., Disp: , Rfl:    montelukast  (SINGULAIR ) 10 MG tablet, TAKE 1 TABLET(10 MG) BY MOUTH AT BEDTIME, Disp: 90 tablet, Rfl: 3   NONFORMULARY OR COMPOUNDED ITEM, Take 1 Inhalation by mouth daily. Mometasone /levofloxacin compounded inhalation, Disp: , Rfl:    ondansetron  (ZOFRAN ) 4 MG tablet, Take 4 mg by mouth every 8 (eight) hours as needed for nausea., Disp: , Rfl:    PHENobarbital  (LUMINAL) 32.4 MG tablet, TAKE 1 TABLET BY MOUTH EVERY DAY AT 6 AM AND 2 TABLETS AT 8 PM, Disp: 270 tablet, Rfl: 1   predniSONE  (DELTASONE ) 5 MG tablet, Take 10 mg by mouth daily., Disp: , Rfl:    VENTOLIN  HFA 108 (90 Base) MCG/ACT inhaler, Inhale 1-2 puffs into the lungs every 4 (four) hours as needed for  wheezing or shortness of breath., Disp: , Rfl:   Current Facility-Administered Medications:    Romosozumab -aqqg (EVENITY ) 105 MG/1. injection 210 mg, 210 mg, Subcutaneous, Q30 days, Wendolyn Jenkins Jansky, MD  Allergies:  Allergies  Allergen Reactions   Iodinated Contrast Media Anaphylaxis    Cardiac arrest   Iodine Other (See Comments)    Cardiac arrest As young adult, received iodinated contrast for IVP, went into cardiac arrest, was resuscitated and hospitalized. Has not received iodinated contrast since that episode. Reports no adverse reaction to betadine.    Metrizamide Other (See Comments)    Cardiac arrest     Morphine Other (See Comments)    Cardiac arrest Patient has previously tolerated hydrocodone, hydromorphone  and fentanyl      Morphine And Codeine Other (See Comments)    Cardiac arrest   Cocklebur    Lambs Quarters    Mugwort    Sheep Sorrel    Spiny Pigweed (Amaranthus Spinosus) Skin Test    Tramadol  Other (See Comments)    Causing severe constipation     Past Medical History, Surgical history, Social history, and Family History were reviewed and updated.  Review of Systems: Review of Systems  Constitutional: Negative.  Negative for appetite change, fatigue, fever and unexpected weight change.  HENT:  Negative.  Negative for lump/mass, mouth sores, sore throat and trouble swallowing.   Eyes: Negative.   Respiratory: Negative.  Negative for cough, hemoptysis and shortness of breath.   Cardiovascular: Negative.  Negative for leg swelling and palpitations.  Gastrointestinal:  Positive for diarrhea. Negative for abdominal distention, abdominal pain, blood in stool, constipation, nausea and vomiting.  Endocrine: Negative.   Genitourinary: Negative.  Negative for bladder incontinence, dysuria, frequency and hematuria.   Musculoskeletal:  Positive for gait problem. Negative for arthralgias, back pain and myalgias.  Skin: Negative.  Negative for itching and rash.  Neurological:  Positive for extremity weakness and gait problem. Negative for dizziness, headaches, numbness, seizures and speech difficulty.  Hematological: Negative.  Does not bruise/bleed easily.  Psychiatric/Behavioral: Negative.  Negative for depression and sleep disturbance. The patient is not nervous/anxious.     Physical Exam:  height is 5' 2 (1.575 m) and weight is 136 lb (61.7 kg). Her oral temperature is 98.3 F (36.8 C). Her blood pressure is 138/58 (abnormal) and her pulse is 78. Her respiration is 16 and oxygen saturation is 100%.   Wt Readings from Last 3 Encounters:  07/04/24 136 lb (61.7 kg)  04/03/24 132 lb (59.9 kg)  02/12/24 133 lb (60.3 kg)    Physical Exam Vitals reviewed.  HENT:     Head: Normocephalic and  atraumatic.  Eyes:     Pupils: Pupils are equal, round, and reactive to light.  Cardiovascular:     Rate and Rhythm: Normal rate and regular rhythm.     Heart sounds: Normal heart sounds.  Pulmonary:     Effort: Pulmonary effort is normal.     Breath sounds: Normal breath sounds.  Abdominal:     General: Bowel sounds are normal.     Palpations: Abdomen is soft.  Musculoskeletal:        General: No tenderness or deformity. Normal range of motion.     Cervical back: Normal range of motion.  Lymphadenopathy:     Cervical: No cervical adenopathy.  Skin:    General: Skin is warm and dry.     Findings: No erythema or rash.  Neurological:     Mental Status: She is alert and  oriented to person, place, and time.  Psychiatric:        Behavior: Behavior normal.        Thought Content: Thought content normal.        Judgment: Judgment normal.      Lab Results  Component Value Date   WBC 6.3 07/04/2024   HGB 14.3 07/04/2024   HCT 43.7 07/04/2024   MCV 93.4 07/04/2024   PLT 212 07/04/2024     Chemistry      Component Value Date/Time   NA 144 07/04/2024 1201   K 5.2 (H) 07/04/2024 1201   CL 108 07/04/2024 1201   CO2 27 07/04/2024 1201   BUN 26 (H) 07/04/2024 1201   CREATININE 0.86 07/04/2024 1201   CREATININE 0.78 06/27/2012 1139      Component Value Date/Time   CALCIUM  9.6 07/04/2024 1201   ALKPHOS 105 07/04/2024 1201   AST 46 (H) 07/04/2024 1201   ALT 36 07/04/2024 1201   BILITOT 0.4 07/04/2024 1201      Impression and Plan:  Ms. Hardt is a very charming 89 year old white female.  She will be 89 years old in April.  I am so happy for her.  She does not even look close to 89 years old.  Her blood counts are doing fairly well right now.  I think we can still follow her along.  I would like to see her back in about 3 months now.      Maude JONELLE Crease, MD 2/6/202612:54 PM "

## 2024-07-08 ENCOUNTER — Ambulatory Visit

## 2024-07-14 ENCOUNTER — Ambulatory Visit

## 2024-08-12 ENCOUNTER — Ambulatory Visit: Admitting: Family Medicine

## 2024-10-03 ENCOUNTER — Inpatient Hospital Stay: Admitting: Hematology & Oncology

## 2024-10-03 ENCOUNTER — Inpatient Hospital Stay

## 2024-12-01 ENCOUNTER — Ambulatory Visit
